# Patient Record
Sex: Female | Born: 1935 | Race: Black or African American | Hispanic: No | Smoking: Former smoker
Health system: Southern US, Community
[De-identification: ages and names within clinical notes are randomized; demographics above are authoritative.]

## PROBLEM LIST (undated history)

## (undated) DIAGNOSIS — I5032 Chronic diastolic (congestive) heart failure: Secondary | ICD-10-CM

## (undated) DIAGNOSIS — Z8719 Personal history of other diseases of the digestive system: Secondary | ICD-10-CM

## (undated) DIAGNOSIS — I739 Peripheral vascular disease, unspecified: Secondary | ICD-10-CM

## (undated) DIAGNOSIS — K5790 Diverticulosis of intestine, part unspecified, without perforation or abscess without bleeding: Secondary | ICD-10-CM

## (undated) DIAGNOSIS — M1990 Unspecified osteoarthritis, unspecified site: Secondary | ICD-10-CM

## (undated) DIAGNOSIS — I779 Disorder of arteries and arterioles, unspecified: Secondary | ICD-10-CM

## (undated) DIAGNOSIS — M545 Low back pain, unspecified: Secondary | ICD-10-CM

## (undated) DIAGNOSIS — M8580 Other specified disorders of bone density and structure, unspecified site: Secondary | ICD-10-CM

## (undated) DIAGNOSIS — Z862 Personal history of diseases of the blood and blood-forming organs and certain disorders involving the immune mechanism: Secondary | ICD-10-CM

## (undated) DIAGNOSIS — F329 Major depressive disorder, single episode, unspecified: Secondary | ICD-10-CM

## (undated) DIAGNOSIS — M5030 Other cervical disc degeneration, unspecified cervical region: Secondary | ICD-10-CM

## (undated) DIAGNOSIS — I219 Acute myocardial infarction, unspecified: Secondary | ICD-10-CM

## (undated) DIAGNOSIS — K2970 Gastritis, unspecified, without bleeding: Secondary | ICD-10-CM

## (undated) DIAGNOSIS — F32A Depression, unspecified: Secondary | ICD-10-CM

## (undated) DIAGNOSIS — I495 Sick sinus syndrome: Secondary | ICD-10-CM

## (undated) DIAGNOSIS — I2510 Atherosclerotic heart disease of native coronary artery without angina pectoris: Secondary | ICD-10-CM

## (undated) DIAGNOSIS — E785 Hyperlipidemia, unspecified: Secondary | ICD-10-CM

## (undated) DIAGNOSIS — H3530 Unspecified macular degeneration: Secondary | ICD-10-CM

## (undated) DIAGNOSIS — K3184 Gastroparesis: Secondary | ICD-10-CM

## (undated) DIAGNOSIS — E039 Hypothyroidism, unspecified: Secondary | ICD-10-CM

## (undated) DIAGNOSIS — N182 Chronic kidney disease, stage 2 (mild): Secondary | ICD-10-CM

## (undated) DIAGNOSIS — D649 Anemia, unspecified: Secondary | ICD-10-CM

## (undated) DIAGNOSIS — E119 Type 2 diabetes mellitus without complications: Secondary | ICD-10-CM

## (undated) DIAGNOSIS — K219 Gastro-esophageal reflux disease without esophagitis: Secondary | ICD-10-CM

## (undated) DIAGNOSIS — B9681 Helicobacter pylori [H. pylori] as the cause of diseases classified elsewhere: Secondary | ICD-10-CM

## (undated) DIAGNOSIS — K2270 Barrett's esophagus without dysplasia: Secondary | ICD-10-CM

## (undated) DIAGNOSIS — I10 Essential (primary) hypertension: Secondary | ICD-10-CM

## (undated) DIAGNOSIS — Z9289 Personal history of other medical treatment: Secondary | ICD-10-CM

## (undated) DIAGNOSIS — F419 Anxiety disorder, unspecified: Secondary | ICD-10-CM

## (undated) HISTORY — PX: EYE SURGERY: SHX253

## (undated) HISTORY — DX: Gastro-esophageal reflux disease without esophagitis: K21.9

## (undated) HISTORY — DX: Other specified disorders of bone density and structure, unspecified site: M85.80

## (undated) HISTORY — DX: Unspecified osteoarthritis, unspecified site: M19.90

## (undated) HISTORY — DX: Other cervical disc degeneration, unspecified cervical region: M50.30

## (undated) HISTORY — DX: Helicobacter pylori (H. pylori) as the cause of diseases classified elsewhere: K29.70

## (undated) HISTORY — DX: Barrett's esophagus without dysplasia: K22.70

## (undated) HISTORY — DX: Personal history of other medical treatment: Z92.89

## (undated) HISTORY — DX: Low back pain: M54.5

## (undated) HISTORY — DX: Depression, unspecified: F32.A

## (undated) HISTORY — DX: Anxiety disorder, unspecified: F41.9

## (undated) HISTORY — DX: Low back pain, unspecified: M54.50

## (undated) HISTORY — DX: Gastroparesis: K31.84

## (undated) HISTORY — DX: Personal history of diseases of the blood and blood-forming organs and certain disorders involving the immune mechanism: Z86.2

## (undated) HISTORY — DX: Hyperlipidemia, unspecified: E78.5

## (undated) HISTORY — DX: Sick sinus syndrome: I49.5

## (undated) HISTORY — DX: Essential (primary) hypertension: I10

## (undated) HISTORY — DX: Atherosclerotic heart disease of native coronary artery without angina pectoris: I25.10

## (undated) HISTORY — PX: ABDOMINAL HYSTERECTOMY: SHX81

## (undated) HISTORY — DX: Disorder of arteries and arterioles, unspecified: I77.9

## (undated) HISTORY — DX: Peripheral vascular disease, unspecified: I73.9

## (undated) HISTORY — PX: CARDIAC CATHETERIZATION: SHX172

## (undated) HISTORY — DX: Major depressive disorder, single episode, unspecified: F32.9

## (undated) HISTORY — PX: OVARIAN CYST REMOVAL: SHX89

## (undated) HISTORY — PX: CORONARY STENT PLACEMENT: SHX1402

## (undated) HISTORY — DX: Type 2 diabetes mellitus without complications: E11.9

## (undated) HISTORY — DX: Anemia, unspecified: D64.9

## (undated) HISTORY — PX: TUBAL LIGATION: SHX77

## (undated) HISTORY — DX: Helicobacter pylori (H. pylori) as the cause of diseases classified elsewhere: B96.81

## (undated) HISTORY — DX: Diverticulosis of intestine, part unspecified, without perforation or abscess without bleeding: K57.90

## (undated) HISTORY — PX: CHOLECYSTECTOMY: SHX55

## (undated) HISTORY — DX: Morbid (severe) obesity due to excess calories: E66.01

## (undated) SURGERY — ARTHROSCOPY, SHOULDER
Anesthesia: General | Laterality: Left

---

## 1997-11-04 ENCOUNTER — Other Ambulatory Visit: Admission: RE | Admit: 1997-11-04 | Discharge: 1997-11-04 | Payer: Self-pay | Admitting: Family Medicine

## 1998-11-04 ENCOUNTER — Encounter: Payer: Self-pay | Admitting: Emergency Medicine

## 1998-11-04 ENCOUNTER — Emergency Department (HOSPITAL_COMMUNITY): Admission: EM | Admit: 1998-11-04 | Discharge: 1998-11-04 | Payer: Self-pay | Admitting: Emergency Medicine

## 1998-11-05 ENCOUNTER — Inpatient Hospital Stay (HOSPITAL_COMMUNITY): Admission: EM | Admit: 1998-11-05 | Discharge: 1998-11-06 | Payer: Self-pay | Admitting: Emergency Medicine

## 1998-11-05 ENCOUNTER — Encounter: Payer: Self-pay | Admitting: *Deleted

## 1998-11-20 ENCOUNTER — Encounter: Admission: RE | Admit: 1998-11-20 | Discharge: 1998-11-20 | Payer: Self-pay | Admitting: Internal Medicine

## 2000-02-21 ENCOUNTER — Encounter: Payer: Self-pay | Admitting: Family Medicine

## 2000-02-21 ENCOUNTER — Ambulatory Visit (HOSPITAL_COMMUNITY): Admission: RE | Admit: 2000-02-21 | Discharge: 2000-02-21 | Payer: Self-pay | Admitting: Family Medicine

## 2000-07-25 ENCOUNTER — Other Ambulatory Visit: Admission: RE | Admit: 2000-07-25 | Discharge: 2000-07-25 | Payer: Self-pay | Admitting: Family Medicine

## 2000-10-10 ENCOUNTER — Encounter: Admission: RE | Admit: 2000-10-10 | Discharge: 2000-10-10 | Payer: Self-pay | Admitting: Obstetrics & Gynecology

## 2001-12-15 ENCOUNTER — Encounter: Payer: Self-pay | Admitting: Emergency Medicine

## 2001-12-15 ENCOUNTER — Emergency Department (HOSPITAL_COMMUNITY): Admission: EM | Admit: 2001-12-15 | Discharge: 2001-12-15 | Payer: Self-pay | Admitting: *Deleted

## 2002-01-17 ENCOUNTER — Encounter: Admission: RE | Admit: 2002-01-17 | Discharge: 2002-02-15 | Payer: Self-pay | Admitting: Orthopedic Surgery

## 2002-08-18 ENCOUNTER — Emergency Department (HOSPITAL_COMMUNITY): Admission: EM | Admit: 2002-08-18 | Discharge: 2002-08-18 | Payer: Self-pay | Admitting: Emergency Medicine

## 2002-08-21 ENCOUNTER — Encounter (HOSPITAL_BASED_OUTPATIENT_CLINIC_OR_DEPARTMENT_OTHER): Admission: RE | Admit: 2002-08-21 | Discharge: 2002-11-19 | Payer: Self-pay | Admitting: Internal Medicine

## 2002-09-10 ENCOUNTER — Encounter: Payer: Self-pay | Admitting: Orthopedic Surgery

## 2002-09-10 ENCOUNTER — Encounter: Admission: RE | Admit: 2002-09-10 | Discharge: 2002-09-10 | Payer: Self-pay | Admitting: Orthopedic Surgery

## 2002-12-13 ENCOUNTER — Emergency Department (HOSPITAL_COMMUNITY): Admission: EM | Admit: 2002-12-13 | Discharge: 2002-12-14 | Payer: Self-pay | Admitting: Emergency Medicine

## 2002-12-14 ENCOUNTER — Encounter: Payer: Self-pay | Admitting: Emergency Medicine

## 2002-12-24 ENCOUNTER — Encounter: Admission: RE | Admit: 2002-12-24 | Discharge: 2003-01-01 | Payer: Self-pay | Admitting: Internal Medicine

## 2003-04-10 ENCOUNTER — Encounter (HOSPITAL_BASED_OUTPATIENT_CLINIC_OR_DEPARTMENT_OTHER): Admission: RE | Admit: 2003-04-10 | Discharge: 2003-04-18 | Payer: Self-pay | Admitting: Internal Medicine

## 2003-05-30 ENCOUNTER — Inpatient Hospital Stay (HOSPITAL_COMMUNITY): Admission: EM | Admit: 2003-05-30 | Discharge: 2003-06-03 | Payer: Self-pay | Admitting: Endocrinology

## 2003-05-30 ENCOUNTER — Emergency Department (HOSPITAL_COMMUNITY): Admission: EM | Admit: 2003-05-30 | Discharge: 2003-05-30 | Payer: Self-pay | Admitting: Emergency Medicine

## 2003-08-10 ENCOUNTER — Emergency Department (HOSPITAL_COMMUNITY): Admission: EM | Admit: 2003-08-10 | Discharge: 2003-08-10 | Payer: Self-pay | Admitting: Emergency Medicine

## 2003-08-13 ENCOUNTER — Encounter (HOSPITAL_BASED_OUTPATIENT_CLINIC_OR_DEPARTMENT_OTHER): Admission: RE | Admit: 2003-08-13 | Discharge: 2003-08-29 | Payer: Self-pay | Admitting: Internal Medicine

## 2003-11-26 ENCOUNTER — Encounter (HOSPITAL_BASED_OUTPATIENT_CLINIC_OR_DEPARTMENT_OTHER): Admission: RE | Admit: 2003-11-26 | Discharge: 2003-12-04 | Payer: Self-pay | Admitting: Internal Medicine

## 2004-02-12 ENCOUNTER — Ambulatory Visit: Payer: Self-pay | Admitting: Internal Medicine

## 2004-02-24 ENCOUNTER — Encounter (HOSPITAL_BASED_OUTPATIENT_CLINIC_OR_DEPARTMENT_OTHER): Admission: RE | Admit: 2004-02-24 | Discharge: 2004-03-24 | Payer: Self-pay | Admitting: Internal Medicine

## 2004-03-08 ENCOUNTER — Ambulatory Visit: Payer: Self-pay | Admitting: Internal Medicine

## 2004-03-11 ENCOUNTER — Ambulatory Visit: Payer: Self-pay | Admitting: Internal Medicine

## 2004-06-01 ENCOUNTER — Ambulatory Visit: Payer: Self-pay | Admitting: Internal Medicine

## 2004-06-02 ENCOUNTER — Ambulatory Visit (HOSPITAL_COMMUNITY): Admission: RE | Admit: 2004-06-02 | Discharge: 2004-06-02 | Payer: Self-pay | Admitting: Internal Medicine

## 2004-06-07 ENCOUNTER — Encounter: Admission: RE | Admit: 2004-06-07 | Discharge: 2004-06-07 | Payer: Self-pay | Admitting: Internal Medicine

## 2004-06-07 ENCOUNTER — Encounter (HOSPITAL_BASED_OUTPATIENT_CLINIC_OR_DEPARTMENT_OTHER): Admission: RE | Admit: 2004-06-07 | Discharge: 2004-06-18 | Payer: Self-pay | Admitting: Internal Medicine

## 2004-06-09 ENCOUNTER — Ambulatory Visit: Payer: Self-pay | Admitting: Internal Medicine

## 2004-07-15 ENCOUNTER — Encounter: Admission: RE | Admit: 2004-07-15 | Discharge: 2004-07-30 | Payer: Self-pay | Admitting: Neurosurgery

## 2004-07-19 ENCOUNTER — Ambulatory Visit: Payer: Self-pay | Admitting: Endocrinology

## 2004-07-20 ENCOUNTER — Ambulatory Visit: Payer: Self-pay | Admitting: Cardiology

## 2004-07-27 ENCOUNTER — Ambulatory Visit: Payer: Self-pay | Admitting: Internal Medicine

## 2004-08-12 ENCOUNTER — Ambulatory Visit: Payer: Self-pay | Admitting: Gastroenterology

## 2004-08-26 ENCOUNTER — Encounter (INDEPENDENT_AMBULATORY_CARE_PROVIDER_SITE_OTHER): Payer: Self-pay | Admitting: *Deleted

## 2004-08-26 ENCOUNTER — Ambulatory Visit: Payer: Self-pay | Admitting: Gastroenterology

## 2004-08-30 ENCOUNTER — Ambulatory Visit (HOSPITAL_COMMUNITY): Admission: RE | Admit: 2004-08-30 | Discharge: 2004-08-30 | Payer: Self-pay | Admitting: Internal Medicine

## 2004-09-07 ENCOUNTER — Ambulatory Visit: Payer: Self-pay | Admitting: Gastroenterology

## 2004-09-14 ENCOUNTER — Ambulatory Visit: Payer: Self-pay | Admitting: Internal Medicine

## 2004-11-01 ENCOUNTER — Ambulatory Visit: Payer: Self-pay | Admitting: Internal Medicine

## 2004-12-14 ENCOUNTER — Ambulatory Visit: Payer: Self-pay | Admitting: Internal Medicine

## 2005-01-25 ENCOUNTER — Ambulatory Visit: Payer: Self-pay | Admitting: Internal Medicine

## 2005-04-19 ENCOUNTER — Ambulatory Visit: Payer: Self-pay | Admitting: Internal Medicine

## 2005-04-26 ENCOUNTER — Ambulatory Visit: Payer: Self-pay | Admitting: Internal Medicine

## 2005-05-17 ENCOUNTER — Observation Stay (HOSPITAL_COMMUNITY): Admission: EM | Admit: 2005-05-17 | Discharge: 2005-05-18 | Payer: Self-pay | Admitting: Emergency Medicine

## 2005-05-17 ENCOUNTER — Ambulatory Visit: Payer: Self-pay | Admitting: Endocrinology

## 2005-06-07 ENCOUNTER — Ambulatory Visit: Payer: Self-pay | Admitting: Internal Medicine

## 2005-07-25 ENCOUNTER — Ambulatory Visit: Payer: Self-pay | Admitting: Internal Medicine

## 2005-09-12 ENCOUNTER — Ambulatory Visit: Payer: Self-pay | Admitting: Internal Medicine

## 2005-09-20 ENCOUNTER — Encounter: Admission: RE | Admit: 2005-09-20 | Discharge: 2005-09-20 | Payer: Self-pay | Admitting: Internal Medicine

## 2005-10-27 ENCOUNTER — Encounter: Admission: RE | Admit: 2005-10-27 | Discharge: 2005-10-27 | Payer: Self-pay | Admitting: Orthopedic Surgery

## 2005-12-08 ENCOUNTER — Inpatient Hospital Stay (HOSPITAL_COMMUNITY): Admission: RE | Admit: 2005-12-08 | Discharge: 2005-12-11 | Payer: Self-pay | Admitting: Orthopedic Surgery

## 2005-12-09 ENCOUNTER — Ambulatory Visit: Payer: Self-pay | Admitting: Internal Medicine

## 2005-12-13 ENCOUNTER — Inpatient Hospital Stay (HOSPITAL_COMMUNITY): Admission: EM | Admit: 2005-12-13 | Discharge: 2005-12-19 | Payer: Self-pay | Admitting: Orthopedic Surgery

## 2006-01-24 ENCOUNTER — Ambulatory Visit: Payer: Self-pay | Admitting: Internal Medicine

## 2006-04-20 ENCOUNTER — Ambulatory Visit: Payer: Self-pay | Admitting: Internal Medicine

## 2006-04-20 LAB — CONVERTED CEMR LAB
AST: 54 units/L — ABNORMAL HIGH (ref 0–37)
Albumin: 3.7 g/dL (ref 3.5–5.2)
Alkaline Phosphatase: 106 units/L (ref 39–117)
BUN: 23 mg/dL (ref 6–23)
Bilirubin Urine: NEGATIVE
CO2: 33 meq/L — ABNORMAL HIGH (ref 19–32)
Calcium: 9.7 mg/dL (ref 8.4–10.5)
Chol/HDL Ratio, serum: 4.1
Creatinine, Ser: 1 mg/dL (ref 0.4–1.2)
Creatinine,U: 81.8 mg/dL
Eosinophil percent: 3 % (ref 0.0–5.0)
HCT: 41.5 % (ref 36.0–46.0)
HDL: 49.7 mg/dL (ref >39.0)
Hemoglobin: 14.2 g/dL (ref 12.0–15.0)
Hgb A1c MFr Bld: 6.9 % — ABNORMAL HIGH (ref 4.6–6.0)
Ketones, ur: NEGATIVE mg/dL
LDL DIRECT: 125.8 mg/dL
Lymphocytes Relative: 41.1 % (ref 12.0–46.0)
Neutrophils Relative %: 43.3 % (ref 43.0–77.0)
Nitrite: NEGATIVE
Potassium: 3.4 meq/L — ABNORMAL LOW (ref 3.5–5.1)
RDW: 13.6 % (ref 11.5–14.6)
Specific Gravity, Urine: 1.01 (ref 1.000–1.03)
TSH: 2.88 microintl units/mL (ref 0.35–5.50)
Total Bilirubin: 0.7 mg/dL (ref 0.3–1.2)
Total Protein, Urine: NEGATIVE mg/dL
Total Protein: 7.4 g/dL (ref 6.0–8.3)
Triglyceride fasting, serum: 192 mg/dL — ABNORMAL HIGH (ref 0–149)
Urine Glucose: NEGATIVE mg/dL

## 2006-05-09 ENCOUNTER — Ambulatory Visit: Payer: Self-pay | Admitting: Gastroenterology

## 2006-05-23 ENCOUNTER — Ambulatory Visit: Payer: Self-pay | Admitting: Gastroenterology

## 2006-06-08 ENCOUNTER — Ambulatory Visit: Payer: Self-pay | Admitting: Internal Medicine

## 2006-06-22 ENCOUNTER — Ambulatory Visit: Payer: Self-pay | Admitting: Internal Medicine

## 2006-08-07 ENCOUNTER — Ambulatory Visit: Payer: Self-pay | Admitting: Internal Medicine

## 2006-08-23 ENCOUNTER — Ambulatory Visit: Payer: Self-pay | Admitting: Internal Medicine

## 2006-08-23 LAB — CONVERTED CEMR LAB
Albumin: 3.4 g/dL — ABNORMAL LOW (ref 3.5–5.2)
CO2: 34 meq/L — ABNORMAL HIGH (ref 19–32)
Chloride: 106 meq/L (ref 96–112)
Cholesterol: 151 mg/dL (ref 0–200)
Creatinine, Ser: 0.9 mg/dL (ref 0.4–1.2)
GFR calc non Af Amer: 66 mL/min
Glucose, Bld: 172 mg/dL — ABNORMAL HIGH (ref 70–99)
HDL: 44.5 mg/dL (ref >39.0)
Sodium: 144 meq/L (ref 135–145)
Total CHOL/HDL Ratio: 3.4
Triglycerides: 166 mg/dL — ABNORMAL HIGH (ref 0–149)

## 2006-09-18 ENCOUNTER — Inpatient Hospital Stay (HOSPITAL_COMMUNITY): Admission: EM | Admit: 2006-09-18 | Discharge: 2006-09-20 | Payer: Self-pay | Admitting: Emergency Medicine

## 2006-09-18 ENCOUNTER — Ambulatory Visit: Payer: Self-pay | Admitting: Internal Medicine

## 2006-09-18 ENCOUNTER — Ambulatory Visit: Payer: Self-pay | Admitting: Cardiology

## 2006-10-26 DIAGNOSIS — I10 Essential (primary) hypertension: Secondary | ICD-10-CM

## 2006-12-19 ENCOUNTER — Ambulatory Visit: Payer: Self-pay | Admitting: Internal Medicine

## 2006-12-19 LAB — CONVERTED CEMR LAB
Albumin: 3.4 g/dL — ABNORMAL LOW (ref 3.5–5.2)
Basophils Absolute: 0.1 10*3/uL (ref 0.0–0.1)
CO2: 30 meq/L (ref 19–32)
Creatinine, Ser: 0.8 mg/dL (ref 0.4–1.2)
HCT: 38.4 % (ref 36.0–46.0)
Hemoglobin: 13.1 g/dL (ref 12.0–15.0)
Hgb A1c MFr Bld: 10.6 % — ABNORMAL HIGH (ref 4.6–6.0)
Lymphocytes Relative: 25.2 % (ref 12.0–46.0)
MCHC: 34 g/dL (ref 30.0–36.0)
Monocytes Absolute: 0.9 10*3/uL — ABNORMAL HIGH (ref 0.2–0.7)
Neutro Abs: 6 10*3/uL (ref 1.4–7.7)
Neutrophils Relative %: 62.1 % (ref 43.0–77.0)
Potassium: 3.5 meq/L (ref 3.5–5.1)
RDW: 12.5 % (ref 11.5–14.6)
Sodium: 139 meq/L (ref 135–145)
Total Bilirubin: 0.6 mg/dL (ref 0.3–1.2)
Total CK: 187 units/L — ABNORMAL HIGH (ref 7–177)
Total Protein: 6.7 g/dL (ref 6.0–8.3)

## 2006-12-25 ENCOUNTER — Ambulatory Visit: Payer: Self-pay | Admitting: Internal Medicine

## 2006-12-25 LAB — CONVERTED CEMR LAB
Bilirubin Urine: NEGATIVE
Hemoglobin, Urine: NEGATIVE
Leukocytes, UA: NEGATIVE
Mucus, UA: NEGATIVE
Total Protein, Urine: NEGATIVE mg/dL
Urine Glucose: >=1000 mg/dL — CR
Urobilinogen, UA: 1 (ref 0.0–1.0)

## 2006-12-29 ENCOUNTER — Emergency Department (HOSPITAL_COMMUNITY): Admission: EM | Admit: 2006-12-29 | Discharge: 2006-12-29 | Payer: Self-pay | Admitting: Emergency Medicine

## 2007-01-01 ENCOUNTER — Encounter: Payer: Self-pay | Admitting: Internal Medicine

## 2007-01-01 DIAGNOSIS — M109 Gout, unspecified: Secondary | ICD-10-CM

## 2007-01-01 DIAGNOSIS — M949 Disorder of cartilage, unspecified: Secondary | ICD-10-CM

## 2007-01-01 DIAGNOSIS — E785 Hyperlipidemia, unspecified: Secondary | ICD-10-CM | POA: Insufficient documentation

## 2007-01-01 DIAGNOSIS — F329 Major depressive disorder, single episode, unspecified: Secondary | ICD-10-CM

## 2007-01-01 DIAGNOSIS — M899 Disorder of bone, unspecified: Secondary | ICD-10-CM | POA: Insufficient documentation

## 2007-01-01 DIAGNOSIS — M1990 Unspecified osteoarthritis, unspecified site: Secondary | ICD-10-CM | POA: Insufficient documentation

## 2007-01-01 DIAGNOSIS — M545 Low back pain, unspecified: Secondary | ICD-10-CM | POA: Insufficient documentation

## 2007-01-01 DIAGNOSIS — I739 Peripheral vascular disease, unspecified: Secondary | ICD-10-CM

## 2007-01-01 DIAGNOSIS — K5730 Diverticulosis of large intestine without perforation or abscess without bleeding: Secondary | ICD-10-CM | POA: Insufficient documentation

## 2007-01-16 ENCOUNTER — Ambulatory Visit: Payer: Self-pay

## 2007-01-17 ENCOUNTER — Ambulatory Visit: Payer: Self-pay

## 2007-01-29 ENCOUNTER — Telehealth (INDEPENDENT_AMBULATORY_CARE_PROVIDER_SITE_OTHER): Payer: Self-pay | Admitting: *Deleted

## 2007-02-15 ENCOUNTER — Ambulatory Visit: Payer: Self-pay | Admitting: Internal Medicine

## 2007-02-15 DIAGNOSIS — K219 Gastro-esophageal reflux disease without esophagitis: Secondary | ICD-10-CM | POA: Insufficient documentation

## 2007-02-15 DIAGNOSIS — R609 Edema, unspecified: Secondary | ICD-10-CM | POA: Insufficient documentation

## 2007-02-15 DIAGNOSIS — F411 Generalized anxiety disorder: Secondary | ICD-10-CM | POA: Insufficient documentation

## 2007-02-15 DIAGNOSIS — M48061 Spinal stenosis, lumbar region without neurogenic claudication: Secondary | ICD-10-CM | POA: Insufficient documentation

## 2007-02-15 DIAGNOSIS — M5137 Other intervertebral disc degeneration, lumbosacral region: Secondary | ICD-10-CM

## 2007-02-15 DIAGNOSIS — K3184 Gastroparesis: Secondary | ICD-10-CM

## 2007-02-15 DIAGNOSIS — M5030 Other cervical disc degeneration, unspecified cervical region: Secondary | ICD-10-CM

## 2007-02-15 DIAGNOSIS — Z8719 Personal history of other diseases of the digestive system: Secondary | ICD-10-CM | POA: Insufficient documentation

## 2007-02-21 ENCOUNTER — Telehealth (INDEPENDENT_AMBULATORY_CARE_PROVIDER_SITE_OTHER): Payer: Self-pay | Admitting: *Deleted

## 2007-02-28 ENCOUNTER — Ambulatory Visit: Payer: Self-pay | Admitting: Internal Medicine

## 2007-02-28 DIAGNOSIS — R42 Dizziness and giddiness: Secondary | ICD-10-CM | POA: Insufficient documentation

## 2007-04-12 DIAGNOSIS — I2510 Atherosclerotic heart disease of native coronary artery without angina pectoris: Secondary | ICD-10-CM

## 2007-04-12 HISTORY — DX: Atherosclerotic heart disease of native coronary artery without angina pectoris: I25.10

## 2007-04-18 ENCOUNTER — Telehealth (INDEPENDENT_AMBULATORY_CARE_PROVIDER_SITE_OTHER): Payer: Self-pay | Admitting: *Deleted

## 2007-04-20 ENCOUNTER — Ambulatory Visit: Payer: Self-pay | Admitting: Internal Medicine

## 2007-04-20 DIAGNOSIS — R079 Chest pain, unspecified: Secondary | ICD-10-CM

## 2007-04-23 ENCOUNTER — Telehealth (INDEPENDENT_AMBULATORY_CARE_PROVIDER_SITE_OTHER): Payer: Self-pay | Admitting: *Deleted

## 2007-04-23 ENCOUNTER — Ambulatory Visit: Payer: Self-pay | Admitting: Cardiology

## 2007-04-23 ENCOUNTER — Ambulatory Visit: Payer: Self-pay | Admitting: Internal Medicine

## 2007-04-23 ENCOUNTER — Inpatient Hospital Stay (HOSPITAL_COMMUNITY): Admission: EM | Admit: 2007-04-23 | Discharge: 2007-04-28 | Payer: Self-pay | Admitting: Emergency Medicine

## 2007-05-03 ENCOUNTER — Ambulatory Visit: Payer: Self-pay | Admitting: Internal Medicine

## 2007-05-03 DIAGNOSIS — Z951 Presence of aortocoronary bypass graft: Secondary | ICD-10-CM

## 2007-05-18 ENCOUNTER — Ambulatory Visit: Payer: Self-pay | Admitting: Internal Medicine

## 2007-05-18 ENCOUNTER — Ambulatory Visit: Payer: Self-pay | Admitting: Cardiovascular Disease

## 2007-05-18 LAB — CONVERTED CEMR LAB
Calcium: 9.3 mg/dL (ref 8.4–10.5)
Chloride: 104 meq/L (ref 96–112)
GFR calc non Af Amer: 65 mL/min
Hgb A1c MFr Bld: 7.5 % — ABNORMAL HIGH (ref 4.6–6.0)
LDL Cholesterol: 57 mg/dL (ref 0–99)
Sodium: 141 meq/L (ref 135–145)
VLDL: 24 mg/dL (ref 0–40)

## 2007-06-04 ENCOUNTER — Encounter (HOSPITAL_COMMUNITY): Admission: RE | Admit: 2007-06-04 | Discharge: 2007-06-04 | Payer: Self-pay | Admitting: Cardiovascular Disease

## 2007-06-06 ENCOUNTER — Ambulatory Visit: Payer: Self-pay | Admitting: Cardiology

## 2007-06-06 ENCOUNTER — Telehealth: Payer: Self-pay | Admitting: Internal Medicine

## 2007-06-06 ENCOUNTER — Observation Stay (HOSPITAL_COMMUNITY): Admission: EM | Admit: 2007-06-06 | Discharge: 2007-06-08 | Payer: Self-pay | Admitting: Emergency Medicine

## 2007-06-06 ENCOUNTER — Encounter: Payer: Self-pay | Admitting: Internal Medicine

## 2007-06-12 ENCOUNTER — Encounter: Payer: Self-pay | Admitting: Internal Medicine

## 2007-08-21 ENCOUNTER — Ambulatory Visit: Payer: Self-pay | Admitting: Cardiovascular Disease

## 2007-09-12 ENCOUNTER — Ambulatory Visit: Payer: Self-pay | Admitting: Internal Medicine

## 2007-09-12 DIAGNOSIS — M79609 Pain in unspecified limb: Secondary | ICD-10-CM

## 2007-09-19 ENCOUNTER — Telehealth: Payer: Self-pay | Admitting: Internal Medicine

## 2007-09-24 ENCOUNTER — Ambulatory Visit: Payer: Self-pay | Admitting: Endocrinology

## 2007-09-24 DIAGNOSIS — R109 Unspecified abdominal pain: Secondary | ICD-10-CM | POA: Insufficient documentation

## 2007-09-24 LAB — CONVERTED CEMR LAB
CO2: 30 meq/L (ref 19–32)
Calcium: 8.7 mg/dL (ref 8.4–10.5)
Chloride: 103 meq/L (ref 96–112)
Glucose, Bld: 234 mg/dL — ABNORMAL HIGH (ref 70–99)
Hemoglobin: 12.7 g/dL (ref 12.0–15.0)
Lymphocytes Relative: 19.6 % (ref 12.0–46.0)
Monocytes Relative: 10.7 % (ref 3.0–12.0)
Neutro Abs: 4.3 10*3/uL (ref 1.4–7.7)
Potassium: 3.6 meq/L (ref 3.5–5.1)
RDW: 14.1 % (ref 11.5–14.6)
Sodium: 140 meq/L (ref 135–145)

## 2007-09-26 ENCOUNTER — Telehealth: Payer: Self-pay | Admitting: Internal Medicine

## 2007-09-26 ENCOUNTER — Encounter: Payer: Self-pay | Admitting: Endocrinology

## 2007-09-27 ENCOUNTER — Encounter: Payer: Self-pay | Admitting: Endocrinology

## 2007-11-08 ENCOUNTER — Telehealth (INDEPENDENT_AMBULATORY_CARE_PROVIDER_SITE_OTHER): Payer: Self-pay | Admitting: *Deleted

## 2007-11-26 ENCOUNTER — Ambulatory Visit: Payer: Self-pay | Admitting: Oncology

## 2007-11-27 ENCOUNTER — Ambulatory Visit: Payer: Self-pay | Admitting: Cardiovascular Disease

## 2007-11-27 ENCOUNTER — Inpatient Hospital Stay (HOSPITAL_COMMUNITY): Admission: EM | Admit: 2007-11-27 | Discharge: 2007-11-29 | Payer: Self-pay | Admitting: Emergency Medicine

## 2007-12-12 ENCOUNTER — Ambulatory Visit: Payer: Self-pay | Admitting: Internal Medicine

## 2007-12-13 ENCOUNTER — Telehealth (INDEPENDENT_AMBULATORY_CARE_PROVIDER_SITE_OTHER): Payer: Self-pay | Admitting: *Deleted

## 2007-12-14 LAB — CONVERTED CEMR LAB
ALT: 42 units/L — ABNORMAL HIGH (ref 0–35)
Basophils Absolute: 0 10*3/uL (ref 0.0–0.1)
Bilirubin Urine: NEGATIVE
Bilirubin, Direct: 0.2 mg/dL (ref 0.0–0.3)
CO2: 30 meq/L (ref 19–32)
Calcium: 8.8 mg/dL (ref 8.4–10.5)
Chloride: 102 meq/L (ref 96–112)
Creatinine, Ser: 1 mg/dL (ref 0.4–1.2)
Creatinine,U: 70.4 mg/dL
Eosinophils Absolute: 0.2 10*3/uL (ref 0.0–0.7)
GFR calc non Af Amer: 58 mL/min
HDL: 32.1 mg/dL — ABNORMAL LOW (ref >39.0)
Hgb A1c MFr Bld: 11.2 % — ABNORMAL HIGH (ref 4.6–6.0)
Ketones, ur: NEGATIVE mg/dL
LDL Cholesterol: 100 mg/dL — ABNORMAL HIGH (ref 0–99)
Leukocytes, UA: NEGATIVE
Lymphocytes Relative: 18 % (ref 12.0–46.0)
MCHC: 34.9 g/dL (ref 30.0–36.0)
MCV: 88.5 fL (ref 78.0–100.0)
Microalb Creat Ratio: 2.8 mg/g (ref 0.0–30.0)
Microalb, Ur: 0.2 mg/dL (ref 0.0–1.9)
Mucus, UA: NEGATIVE
Neutrophils Relative %: 71.8 % (ref 43.0–77.0)
RDW: 13.8 % (ref 11.5–14.6)
Sodium: 136 meq/L (ref 135–145)
Squamous Epithelial / LPF: NEGATIVE /lpf
TSH: 2.12 microintl units/mL (ref 0.35–5.50)
Total Bilirubin: 0.7 mg/dL (ref 0.3–1.2)
Total CHOL/HDL Ratio: 5.3
Triglycerides: 186 mg/dL — ABNORMAL HIGH (ref 0–149)
Urobilinogen, UA: 1 (ref 0.0–1.0)
VLDL: 37 mg/dL (ref 0–40)

## 2007-12-19 ENCOUNTER — Telehealth (INDEPENDENT_AMBULATORY_CARE_PROVIDER_SITE_OTHER): Payer: Self-pay | Admitting: *Deleted

## 2007-12-28 ENCOUNTER — Ambulatory Visit: Payer: Self-pay | Admitting: Endocrinology

## 2007-12-31 ENCOUNTER — Encounter: Payer: Self-pay | Admitting: Internal Medicine

## 2008-01-14 ENCOUNTER — Ambulatory Visit: Payer: Self-pay | Admitting: Endocrinology

## 2008-01-21 ENCOUNTER — Telehealth (INDEPENDENT_AMBULATORY_CARE_PROVIDER_SITE_OTHER): Payer: Self-pay | Admitting: *Deleted

## 2008-01-29 ENCOUNTER — Telehealth (INDEPENDENT_AMBULATORY_CARE_PROVIDER_SITE_OTHER): Payer: Self-pay | Admitting: *Deleted

## 2008-02-01 ENCOUNTER — Ambulatory Visit: Payer: Self-pay | Admitting: Endocrinology

## 2008-03-22 ENCOUNTER — Ambulatory Visit: Payer: Self-pay | Admitting: Cardiovascular Disease

## 2008-03-22 ENCOUNTER — Inpatient Hospital Stay (HOSPITAL_COMMUNITY): Admission: EM | Admit: 2008-03-22 | Discharge: 2008-03-25 | Payer: Self-pay | Admitting: Emergency Medicine

## 2008-05-08 ENCOUNTER — Encounter: Payer: Self-pay | Admitting: Internal Medicine

## 2008-07-30 ENCOUNTER — Telehealth: Payer: Self-pay | Admitting: Internal Medicine

## 2008-08-22 ENCOUNTER — Ambulatory Visit: Payer: Self-pay | Admitting: Endocrinology

## 2008-08-22 LAB — CONVERTED CEMR LAB
CO2: 30 meq/L (ref 19–32)
Calcium: 8.8 mg/dL (ref 8.4–10.5)
Chloride: 107 meq/L (ref 96–112)
Glucose, Bld: 240 mg/dL — ABNORMAL HIGH (ref 70–99)
HDL: 36.7 mg/dL — ABNORMAL LOW (ref >39.00)
Hgb A1c MFr Bld: 11.4 % — ABNORMAL HIGH (ref 4.6–6.5)
Potassium: 3.8 meq/L (ref 3.5–5.1)
Sodium: 140 meq/L (ref 135–145)
Total CHOL/HDL Ratio: 5

## 2008-09-02 ENCOUNTER — Ambulatory Visit: Payer: Self-pay | Admitting: Internal Medicine

## 2008-09-02 LAB — CONVERTED CEMR LAB
ALT: 23 units/L (ref 0–35)
Alkaline Phosphatase: 118 units/L — ABNORMAL HIGH (ref 39–117)
BUN: 16 mg/dL (ref 6–23)
Bilirubin, Direct: 0.2 mg/dL (ref 0.0–0.3)
Calcium: 9.3 mg/dL (ref 8.4–10.5)
Chloride: 109 meq/L (ref 96–112)
Cholesterol: 139 mg/dL (ref 0–200)
Creatinine, Ser: 0.6 mg/dL (ref 0.4–1.2)
Eosinophils Absolute: 0.2 10*3/uL (ref 0.0–0.7)
Eosinophils Relative: 2.6 % (ref 0.0–5.0)
HDL: 43.9 mg/dL (ref >39.00)
Hgb A1c MFr Bld: 11.3 % — ABNORMAL HIGH (ref 4.6–6.5)
LDL Cholesterol: 78 mg/dL (ref 0–99)
Lymphocytes Relative: 22.8 % (ref 12.0–46.0)
MCV: 86.5 fL (ref 78.0–100.0)
Monocytes Absolute: 0.5 10*3/uL (ref 0.1–1.0)
Neutrophils Relative %: 65.5 % (ref 43.0–77.0)
Nitrite: NEGATIVE
Platelets: 95 10*3/uL — ABNORMAL LOW (ref 150.0–400.0)
RBC: 4.18 M/uL (ref 3.87–5.11)
Total Bilirubin: 0.6 mg/dL (ref 0.3–1.2)
Total CHOL/HDL Ratio: 3
Total Protein, Urine: 100 mg/dL
Triglycerides: 86 mg/dL (ref 0.0–149.0)
Urine Glucose: 500 mg/dL
VLDL: 17.2 mg/dL (ref 0.0–40.0)
WBC: 6.5 10*3/uL (ref 4.5–10.5)
pH: 6 (ref 5.0–8.0)

## 2008-09-09 ENCOUNTER — Ambulatory Visit: Payer: Self-pay | Admitting: Internal Medicine

## 2008-09-11 ENCOUNTER — Telehealth (INDEPENDENT_AMBULATORY_CARE_PROVIDER_SITE_OTHER): Payer: Self-pay | Admitting: *Deleted

## 2008-09-25 ENCOUNTER — Ambulatory Visit: Payer: Self-pay | Admitting: Internal Medicine

## 2008-11-24 ENCOUNTER — Telehealth: Payer: Self-pay | Admitting: Endocrinology

## 2008-11-26 ENCOUNTER — Telehealth: Payer: Self-pay | Admitting: Internal Medicine

## 2008-12-10 ENCOUNTER — Encounter: Payer: Self-pay | Admitting: Endocrinology

## 2008-12-12 ENCOUNTER — Ambulatory Visit: Payer: Self-pay | Admitting: Endocrinology

## 2008-12-12 DIAGNOSIS — R809 Proteinuria, unspecified: Secondary | ICD-10-CM | POA: Insufficient documentation

## 2008-12-28 ENCOUNTER — Emergency Department (HOSPITAL_COMMUNITY): Admission: EM | Admit: 2008-12-28 | Discharge: 2008-12-28 | Payer: Self-pay | Admitting: Emergency Medicine

## 2008-12-29 ENCOUNTER — Ambulatory Visit: Payer: Self-pay | Admitting: Internal Medicine

## 2008-12-29 DIAGNOSIS — M25519 Pain in unspecified shoulder: Secondary | ICD-10-CM

## 2009-01-13 ENCOUNTER — Ambulatory Visit: Payer: Self-pay | Admitting: Endocrinology

## 2009-02-09 ENCOUNTER — Encounter: Admission: RE | Admit: 2009-02-09 | Discharge: 2009-02-09 | Payer: Self-pay | Admitting: Orthopedic Surgery

## 2009-03-13 ENCOUNTER — Ambulatory Visit: Payer: Self-pay | Admitting: Endocrinology

## 2009-03-13 LAB — CONVERTED CEMR LAB: Hgb A1c MFr Bld: 9.9 % — ABNORMAL HIGH (ref 4.6–6.5)

## 2009-03-20 ENCOUNTER — Ambulatory Visit: Payer: Self-pay | Admitting: Endocrinology

## 2009-03-20 LAB — CONVERTED CEMR LAB
CO2: 30 meq/L (ref 19–32)
Calcium: 8.9 mg/dL (ref 8.4–10.5)
Chloride: 105 meq/L (ref 96–112)
Potassium: 4.4 meq/L (ref 3.5–5.1)
Sodium: 140 meq/L (ref 135–145)

## 2009-04-11 HISTORY — PX: CORONARY ARTERY BYPASS GRAFT: SHX141

## 2009-05-11 ENCOUNTER — Ambulatory Visit: Payer: Self-pay | Admitting: Internal Medicine

## 2009-05-11 DIAGNOSIS — B029 Zoster without complications: Secondary | ICD-10-CM | POA: Insufficient documentation

## 2009-06-19 ENCOUNTER — Ambulatory Visit: Payer: Self-pay | Admitting: Endocrinology

## 2009-08-06 ENCOUNTER — Telehealth: Payer: Self-pay | Admitting: Internal Medicine

## 2009-09-11 ENCOUNTER — Ambulatory Visit: Payer: Self-pay | Admitting: Internal Medicine

## 2009-09-11 DIAGNOSIS — IMO0002 Reserved for concepts with insufficient information to code with codable children: Secondary | ICD-10-CM

## 2009-09-11 DIAGNOSIS — M549 Dorsalgia, unspecified: Secondary | ICD-10-CM | POA: Insufficient documentation

## 2009-09-11 DIAGNOSIS — J309 Allergic rhinitis, unspecified: Secondary | ICD-10-CM | POA: Insufficient documentation

## 2009-09-25 ENCOUNTER — Encounter: Payer: Self-pay | Admitting: Internal Medicine

## 2009-10-30 ENCOUNTER — Ambulatory Visit: Payer: Self-pay | Admitting: Internal Medicine

## 2009-10-30 ENCOUNTER — Ambulatory Visit: Payer: Self-pay | Admitting: Endocrinology

## 2009-10-30 DIAGNOSIS — I495 Sick sinus syndrome: Secondary | ICD-10-CM | POA: Insufficient documentation

## 2009-11-02 ENCOUNTER — Ambulatory Visit: Payer: Self-pay | Admitting: Internal Medicine

## 2009-11-04 ENCOUNTER — Telehealth: Payer: Self-pay | Admitting: Internal Medicine

## 2009-11-16 ENCOUNTER — Ambulatory Visit (HOSPITAL_COMMUNITY): Admission: RE | Admit: 2009-11-16 | Discharge: 2009-11-16 | Payer: Self-pay | Admitting: Internal Medicine

## 2009-11-16 ENCOUNTER — Encounter: Payer: Self-pay | Admitting: Internal Medicine

## 2009-11-16 ENCOUNTER — Ambulatory Visit: Payer: Self-pay | Admitting: Internal Medicine

## 2009-11-16 ENCOUNTER — Ambulatory Visit: Payer: Self-pay

## 2009-11-26 ENCOUNTER — Ambulatory Visit: Payer: Self-pay | Admitting: Cardiovascular Disease

## 2009-11-26 DIAGNOSIS — I5033 Acute on chronic diastolic (congestive) heart failure: Secondary | ICD-10-CM | POA: Insufficient documentation

## 2009-12-10 LAB — CONVERTED CEMR LAB
BUN: 26 mg/dL — ABNORMAL HIGH (ref 6–23)
Creatinine, Ser: 1.1 mg/dL (ref 0.4–1.2)
GFR calc non Af Amer: 63.59 mL/min (ref >60)
Pro B Natriuretic peptide (BNP): 81.6 pg/mL (ref 0.0–100.0)

## 2009-12-11 ENCOUNTER — Ambulatory Visit: Payer: Self-pay | Admitting: Endocrinology

## 2010-01-21 ENCOUNTER — Ambulatory Visit: Payer: Self-pay | Admitting: Endocrinology

## 2010-02-11 ENCOUNTER — Ambulatory Visit: Payer: Self-pay | Admitting: Cardiovascular Disease

## 2010-02-11 ENCOUNTER — Encounter: Payer: Self-pay | Admitting: Cardiovascular Disease

## 2010-02-11 ENCOUNTER — Inpatient Hospital Stay (HOSPITAL_COMMUNITY): Admission: AD | Admit: 2010-02-11 | Discharge: 2010-03-02 | Payer: Self-pay | Admitting: Cardiovascular Disease

## 2010-02-11 DIAGNOSIS — I5032 Chronic diastolic (congestive) heart failure: Secondary | ICD-10-CM

## 2010-02-11 DIAGNOSIS — I5030 Unspecified diastolic (congestive) heart failure: Secondary | ICD-10-CM | POA: Insufficient documentation

## 2010-02-13 ENCOUNTER — Encounter: Payer: Self-pay | Admitting: Cardiovascular Disease

## 2010-02-14 ENCOUNTER — Encounter: Payer: Self-pay | Admitting: Cardiovascular Disease

## 2010-02-16 ENCOUNTER — Encounter: Payer: Self-pay | Admitting: Thoracic Surgery (Cardiothoracic Vascular Surgery)

## 2010-02-16 ENCOUNTER — Encounter: Payer: Self-pay | Admitting: Cardiovascular Disease

## 2010-02-24 ENCOUNTER — Encounter: Payer: Self-pay | Admitting: Endocrinology

## 2010-02-25 ENCOUNTER — Encounter: Payer: Self-pay | Admitting: Thoracic Surgery (Cardiothoracic Vascular Surgery)

## 2010-03-12 ENCOUNTER — Ambulatory Visit: Payer: Self-pay | Admitting: Internal Medicine

## 2010-03-15 ENCOUNTER — Ambulatory Visit: Payer: Self-pay | Admitting: Thoracic Surgery (Cardiothoracic Vascular Surgery)

## 2010-03-15 ENCOUNTER — Encounter
Admission: RE | Admit: 2010-03-15 | Discharge: 2010-03-15 | Payer: Self-pay | Admitting: Thoracic Surgery (Cardiothoracic Vascular Surgery)

## 2010-03-15 ENCOUNTER — Encounter: Payer: Self-pay | Admitting: Cardiovascular Disease

## 2010-03-15 ENCOUNTER — Encounter: Payer: Self-pay | Admitting: Internal Medicine

## 2010-03-23 ENCOUNTER — Ambulatory Visit: Payer: Self-pay | Admitting: Cardiovascular Disease

## 2010-03-23 ENCOUNTER — Encounter: Payer: Self-pay | Admitting: Cardiovascular Disease

## 2010-03-23 LAB — CONVERTED CEMR LAB
GFR calc non Af Amer: 78.41 mL/min (ref >60.00)
Potassium: 4.1 meq/L (ref 3.5–5.1)
Sodium: 141 meq/L (ref 135–145)

## 2010-03-24 ENCOUNTER — Telehealth: Payer: Self-pay | Admitting: Cardiovascular Disease

## 2010-03-29 ENCOUNTER — Ambulatory Visit: Payer: Self-pay | Admitting: Thoracic Surgery (Cardiothoracic Vascular Surgery)

## 2010-04-02 ENCOUNTER — Ambulatory Visit: Payer: Self-pay | Admitting: Internal Medicine

## 2010-04-02 ENCOUNTER — Encounter: Payer: Self-pay | Admitting: Internal Medicine

## 2010-04-02 ENCOUNTER — Ambulatory Visit: Payer: Self-pay

## 2010-04-02 DIAGNOSIS — D649 Anemia, unspecified: Secondary | ICD-10-CM | POA: Insufficient documentation

## 2010-04-02 LAB — CONVERTED CEMR LAB
BUN: 22 mg/dL (ref 6–23)
Eosinophils Relative: 3 % (ref 0.0–5.0)
GFR calc non Af Amer: 70.24 mL/min (ref >60.00)
HCT: 33.4 % — ABNORMAL LOW (ref 36.0–46.0)
Hemoglobin: 11.1 g/dL — ABNORMAL LOW (ref 12.0–15.0)
Hgb A1c MFr Bld: 6.8 % — ABNORMAL HIGH (ref 4.6–6.5)
LDL Cholesterol: 50 mg/dL (ref 0–99)
Lymphs Abs: 1.3 10*3/uL (ref 0.7–4.0)
Monocytes Relative: 6.7 % (ref 3.0–12.0)
Neutro Abs: 9.3 10*3/uL — ABNORMAL HIGH (ref 1.4–7.7)
Potassium: 3.8 meq/L (ref 3.5–5.1)
Sodium: 138 meq/L (ref 135–145)
VLDL: 23.6 mg/dL (ref 0.0–40.0)
WBC: 11.7 10*3/uL — ABNORMAL HIGH (ref 4.5–10.5)

## 2010-04-06 ENCOUNTER — Telehealth: Payer: Self-pay | Admitting: Internal Medicine

## 2010-04-13 ENCOUNTER — Telehealth: Payer: Self-pay | Admitting: Internal Medicine

## 2010-04-19 ENCOUNTER — Encounter: Payer: Self-pay | Admitting: Endocrinology

## 2010-04-19 ENCOUNTER — Ambulatory Visit
Admission: RE | Admit: 2010-04-19 | Discharge: 2010-04-19 | Payer: Self-pay | Source: Home / Self Care | Attending: Endocrinology | Admitting: Endocrinology

## 2010-04-19 LAB — CONVERTED CEMR LAB: Fructosamine: 312 umol/L — ABNORMAL HIGH

## 2010-04-22 ENCOUNTER — Ambulatory Visit
Admission: RE | Admit: 2010-04-22 | Discharge: 2010-04-22 | Payer: Self-pay | Source: Home / Self Care | Attending: Cardiovascular Disease | Admitting: Cardiovascular Disease

## 2010-04-29 ENCOUNTER — Encounter: Payer: Self-pay | Admitting: Cardiovascular Disease

## 2010-05-05 ENCOUNTER — Ambulatory Visit
Admission: RE | Admit: 2010-05-05 | Discharge: 2010-05-05 | Payer: Self-pay | Source: Home / Self Care | Attending: Endocrinology | Admitting: Endocrinology

## 2010-05-05 LAB — CONVERTED CEMR LAB: Blood Glucose, Fingerstick: 194

## 2010-05-09 LAB — CONVERTED CEMR LAB
ALT: 30 units/L (ref 0–35)
AST: 23 units/L (ref 0–37)
Albumin: 3.6 g/dL (ref 3.5–5.2)
Basophils Absolute: 0 10*3/uL (ref 0.0–0.1)
CO2: 28 meq/L (ref 19–32)
Chloride: 101 meq/L (ref 96–112)
Cholesterol: 152 mg/dL (ref 0–200)
Creatinine,U: 162.1 mg/dL
Direct LDL: 82.5 mg/dL
GFR calc non Af Amer: 89.96 mL/min (ref >60)
Glucose, Bld: 286 mg/dL — ABNORMAL HIGH (ref 70–99)
HCT: 37.1 % (ref 36.0–46.0)
Hemoglobin: 12.6 g/dL (ref 12.0–15.0)
Hgb A1c MFr Bld: 10.7 % — ABNORMAL HIGH (ref 4.6–6.5)
Lymphs Abs: 1.8 10*3/uL (ref 0.7–4.0)
MCV: 87.1 fL (ref 78.0–100.0)
Microalb Creat Ratio: 6.8 mg/g (ref 0.0–30.0)
Microalb, Ur: 11 mg/dL — ABNORMAL HIGH (ref 0.0–1.9)
Monocytes Relative: 5.8 % (ref 3.0–12.0)
Neutro Abs: 9.1 10*3/uL — ABNORMAL HIGH (ref 1.4–7.7)
Potassium: 4.2 meq/L (ref 3.5–5.1)
RDW: 13.7 % (ref 11.5–14.6)
Sodium: 138 meq/L (ref 135–145)
Specific Gravity, Urine: 1.025 (ref 1.000–1.030)
Urine Glucose: 100 mg/dL
VLDL: 43.2 mg/dL — ABNORMAL HIGH (ref 0.0–40.0)

## 2010-05-11 ENCOUNTER — Encounter: Payer: Self-pay | Admitting: Internal Medicine

## 2010-05-11 ENCOUNTER — Ambulatory Visit
Admission: RE | Admit: 2010-05-11 | Discharge: 2010-05-11 | Payer: Self-pay | Source: Home / Self Care | Attending: Internal Medicine | Admitting: Internal Medicine

## 2010-05-11 DIAGNOSIS — R300 Dysuria: Secondary | ICD-10-CM | POA: Insufficient documentation

## 2010-05-11 DIAGNOSIS — L299 Pruritus, unspecified: Secondary | ICD-10-CM | POA: Insufficient documentation

## 2010-05-11 LAB — CONVERTED CEMR LAB
Bilirubin Urine: NEGATIVE
Protein, U semiquant: NEGATIVE
Specific Gravity, Urine: 1.01
WBC Urine, dipstick: NEGATIVE

## 2010-05-13 NOTE — Assessment & Plan Note (Signed)
Summary: npv/ peritheral  edema  Medications Added POTASSIUM CHLORIDE CRYS CR 20 MEQ CR-TABS (POTASSIUM CHLORIDE CRYS CR) Take one tablet by mouth twice a day        Visit Type:  Initial Consult Primary Provider:  Oliver Barre  CC:  Follow-up.  History of Present Illness: This is a 77 year-old woman with CAD s/p multivessel PCI in 2009. She was treated with drug-eluting stents at that time. The patient has been followed by Dr Jonny Ruiz, and recently presented with worsening leg swelling, dyspnea, and fatigue. Her lasix was increased and other meds were adjusted. She reports improvement in leg swelling, but continue to have dyspnea with low-level exertion. No chest pain, palps, or other complaints. She was taken off of beta blockers because of marked bradycardia and off of diovan because it exacerbated her leg swelling.  Current Medications (verified): 1)  Aurora Lancet Super Thin 30g  Misc (Lancets) .... Use 1 Stick As Directed Once  A Day 2)  Indomethacin 50 Mg Caps (Indomethacin) .Marland Kitchen.. 1 By Mouth Three Times A Day As Needed Gout 3)  Diclofenac Sodium 75 Mg Tbec (Diclofenac Sodium) .... Take 1 Tablet By Mouth Twice A Day 4)  Furosemide 80 Mg Tabs (Furosemide) .Marland Kitchen.. 1 By Mouth Qam 5)  Onetouch Ultra Test   Strp (Glucose Blood) .... Use Asd 1 Strip Tid 6)  Omeprazole 20 Mg  Cpdr (Omeprazole) .... 2 By Mouth Once Daily 7)  Plavix 75 Mg  Tabs (Clopidogrel Bisulfate) .Marland Kitchen.. 1po Qd 8)  Ecotrin Low Strength 81 Mg  Tbec (Aspirin) .Marland Kitchen.. 1po Qd 9)  Nitroquick 0.4 Mg  Subl (Nitroglycerin) .... Use Asd 10)  Pravachol 40 Mg  Tabs (Pravastatin Sodium) .Marland Kitchen.. 1 By Mouth Qd 11)  Potassium Chloride Crys Cr 20 Meq Cr-Tabs (Potassium Chloride Crys Cr) .... Take One Tablet By Mouth Twice A Day 12)  Allopurinol 100 Mg Tabs (Allopurinol) .Marland Kitchen.. 1po Once Daily 13)  Onetouch Ultra Test  Strp (Glucose Blood) .... Two Times A Day, and Lancets 250.00 14)  Relion Insulin Syringe 31g X 5/16" 0.5 Ml Misc (Insulin Syringe-Needle  U-100) .... Bid 15)  Oxycodone Hcl 5 Mg Tabs (Oxycodone Hcl) .Marland Kitchen.. 1  - 2  Tabs By Mouth Q 6 Hrs As Needed Pain 16)  Methocarbamol 500 Mg Tabs (Methocarbamol) .Marland Kitchen.. 1 Q6h As Needed Cramps 17)  Fexofenadine Hcl 180 Mg Tabs (Fexofenadine Hcl) .Marland Kitchen.. 1po Once Daily As Needed Allergies 18)  Humulin N 100 Unit/ml Susp (Insulin Isophane Human) .... 55 Units Am and 40 Units Pm  Allergies: 1)  ! Sulfa 2)  ! Pcn 3)  ! Metformin Hcl (Metformin Hcl) 4)  ! Cardizem La (Diltiazem Hcl Coated Beads) 5)  ! Lovastatin (Lovastatin) 6)  ! * Shellfish  Past History:  Past medical, surgical, family and social histories (including risk factors) reviewed, and no changes noted (except as noted below).  Past Medical History: Reviewed history from 10/30/2009 and no changes required. Hypertension Diabetes mellitus, type II Peripheral vascular disease Diverticulosis, colon Morbid Obesity Gout Depression Osteoarthritis Osteopenia Hyperlipidemia Low back pain/'lumbar disc dz/lumbar spinal stenosis cervical spine disc dz Anxiety GERD Barrett's esophagus gastroparesis hx of h pylori gastritis Coronary artery disease Allergic rhinitis  Past Surgical History: Reviewed history from 10/30/2009 and no changes required. Cholecystectomy Hysterectomy Tubal ligation Ovarian Cystectomies s/p stent x 3 - Dr Excell Seltzer  Family History: Reviewed history from 02/15/2007 and no changes required. mother  - dm, heart dz Family History of CAD Female 1st degree relative - 75  yo  Social History: Reviewed history from 09/11/2009 and no changes required. Former Smoker Alcohol use-no retired widowed 2004 Drug use-no  Review of Systems       Negative except as per HPI   Vital Signs:  Patient profile:   77 year old female Height:      67 inches Weight:      236 pounds BMI:     37.10 Pulse rate:   56 / minute Pulse rhythm:   regular Resp:     20 per minute BP sitting:   134 / 68  (left arm) Cuff size:    large  Vitals Entered By: Vikki Ports (November 26, 2009 10:30 AM)  Serial Vital Signs/Assessments:  Time      Position  BP       Pulse  Resp  Temp     By           R Arm     134/64                         Vikki Ports  CC: Follow-up Comments Patient is not taking Losartan any more due to side effects. Last dose few days ago.   Physical Exam  General:  Pt is alert and oriented, obese, African-American woman, in no acute distress. HEENT: normal Neck: normal carotid upstrokes with bilateral bruits, JVP normal Lungs: CTA CV: RRR without murmur or gallop Abd: soft, NT, positive BS, no bruit, no organomegaly Ext: trace bilateral pretibial edema. peripheral pulses 2+ and equal Skin: warm and dry without rash    EKG  Procedure date:  11/16/2009  Findings:      Study Conclusions            - Left ventricle: The cavity size was normal. Wall thickness was       normal. Systolic function was normal. The estimated ejection       fraction was in the range of 60% to 65%. Wall motion was normal;       there were no regional wall motion abnormalities. Doppler       parameters are consistent with abnormal left ventricular       relaxation (grade 1 diastolic dysfunction).     - Mitral valve: Calcified annulus. Mildly thickened leaflets . Mild       regurgitation.  EKG  Procedure date:  09/11/2009  Findings:      Sinus bradycardia with hr 55 bpm, otherwise within normal limits  Impression & Recommendations:  Problem # 1:  ACUTE ON CHRONIC DIASTOLIC HEART FAILURE (ICD-428.33) The patient has symptoms consistent with diastolic heart failure. She has exam findings consistent with only mild volume overload. Recommend check BMET and BNP today and follow-up with med adjustments after reviewing her lab values. Likely will increase furosemide to two times a day dosing. Also will consider another trial of an ACE or ARB. Will hold on beta blockers because of bradycardia. Will f/u in 3  months.  The following medications were removed from the medication list:    Losartan Potassium 100 Mg Tabs (Losartan potassium) .Marland Kitchen... 1po once daily Her updated medication list for this problem includes:    Furosemide 80 Mg Tabs (Furosemide) .Marland Kitchen... 1 by mouth qam    Plavix 75 Mg Tabs (Clopidogrel bisulfate) .Marland Kitchen... 1po qd    Ecotrin Low Strength 81 Mg Tbec (Aspirin) .Marland Kitchen... 1po qd    Nitroquick 0.4 Mg Subl (Nitroglycerin) ..... Use asd  Orders: TLB-BMP (Basic  Metabolic Panel-BMET) (80048-METABOL) TLB-BNP (B-Natriuretic Peptide) (83880-BNPR)  Problem # 2:  CORONARY ARTERY DISEASE (ICD-414.00) Stable after multivessel PCI a few years ago. She is having no angina and EKG remains normal. Continue DAPT with ASA and plavix indefinitely if she tolerates.  Her updated medication list for this problem includes:    Plavix 75 Mg Tabs (Clopidogrel bisulfate) .Marland Kitchen... 1po qd    Ecotrin Low Strength 81 Mg Tbec (Aspirin) .Marland Kitchen... 1po qd    Nitroquick 0.4 Mg Subl (Nitroglycerin) ..... Use asd  Orders: TLB-BMP (Basic Metabolic Panel-BMET) (80048-METABOL) TLB-BNP (B-Natriuretic Peptide) (83880-BNPR)  Problem # 3:  HYPERTENSION (ICD-401.9) BP controlled.  The following medications were removed from the medication list:    Losartan Potassium 100 Mg Tabs (Losartan potassium) .Marland Kitchen... 1po once daily Her updated medication list for this problem includes:    Furosemide 80 Mg Tabs (Furosemide) .Marland Kitchen... 1 by mouth qam    Ecotrin Low Strength 81 Mg Tbec (Aspirin) .Marland Kitchen... 1po qd  BP today: 134/68 Prior BP: 122/62 (11/02/2009)  Prior 10 Yr Risk Heart Disease: N/A (09/11/2009)  Labs Reviewed: K+: 4.2 (09/11/2009) Creat: : 0.8 (09/11/2009)   Chol: 152 (09/11/2009)   HDL: 40.00 (09/11/2009)   LDL: 78 (09/02/2008)   TG: 216.0 (09/11/2009)  Problem # 4:  HYPERLIPIDEMIA (ICD-272.4) Lipids at goal on pravastatin.  Her updated medication list for this problem includes:    Pravachol 40 Mg Tabs (Pravastatin sodium) .Marland Kitchen... 1  by mouth qd  CHOL: 152 (09/11/2009)   LDL: 78 (09/02/2008)   HDL: 40.00 (09/11/2009)   TG: 216.0 (09/11/2009)  Patient Instructions: 1)  Your physician recommends that you schedule a follow-up appointment in: 3 MONTHS with Dr Excell Seltzer 2)  Your physician recommends that you have  lab work today: BMP, BNP  3)  Your physician recommends that you continue on your current medications as directed. Please refer to the Current Medication list given to you today.

## 2010-05-13 NOTE — Progress Notes (Signed)
Summary: Rx refill req  Phone Note Refill Request Message from:  Patient on April 13, 2010 10:35 AM  Refills Requested: Medication #1:  ALLOPURINOL 100 MG TABS 1po once daily   Dosage confirmed as above?Dosage Confirmed   Supply Requested: 3 months  Method Requested: Electronic Initial call taken by: Margaret Pyle, CMA,  April 13, 2010 10:35 AM    Prescriptions: ALLOPURINOL 100 MG TABS (ALLOPURINOL) 1po once daily  #90 x 1   Entered by:   Margaret Pyle, CMA   Authorized by:   Corwin Levins MD   Signed by:   Margaret Pyle, CMA on 04/13/2010   Method used:   Electronically to        [facility] (retail)       [address]       [place], [place]  [address]       Ph: [phone]       Fax: [phone]   RxID:   [id number]

## 2010-05-13 NOTE — Letter (Signed)
Summary: [facility]  [facility]   Imported By: Lester Chenango 11/02/2009 09:18:00  _____________________________________________________________________  External Attachment:    Type:   Image     Comment:   External Document

## 2010-05-13 NOTE — Assessment & Plan Note (Signed)
Summary: 3 MTH FU---STC   Vital Signs:  Patient profile:   77 year old female Height:      67 inches (170.18 cm) Weight:      227.50 pounds (103.41 kg) BMI:     35.76 O2 Sat:      96 % on Room air Temp:     98.3 degrees F (36.83 degrees C) oral Pulse rate:   72 / minute Pulse rhythm:   regular BP sitting:   126 / 74  (left arm) Cuff size:   large  Vitals Entered By: Brenton Grills CMA Duncan Dull) (April 19, 2010 9:24 AM)  O2 Flow:  Room air CC: Follow up visit/Elevated CBG's/aj Is Patient Diabetic? Yes   Primary Provider:  Oliver Barre  CC:  Follow up visit/Elevated CBG's/aj.  History of Present Illness: she brings a record of her cbg's which i have reviewed today.  she has had hypoglycemia only twice, but she does not recall the time of day this was.  she takes only 30 units of insulin two times a day.  it was reduced when she was in the hospital for cabg.  most cbg's are in the 200's.  she has lost weight since last ov.     Current Medications (verified): 1)  Aurora Lancet Super Thin 30g  Misc (Lancets) .... Use 1 Stick As Directed Once  A Day 2)  Furosemide 80 Mg Tabs (Furosemide) .Marland Kitchen.. 1 By Mouth Qam 3)  Onetouch Ultra Test   Strp (Glucose Blood) .... Use Asd 1 Strip Tid 4)  Omeprazole 20 Mg  Cpdr (Omeprazole) .... 2 By Mouth Once Daily 5)  Aspirin Ec 325 Mg Tbec (Aspirin) .... Take One Tablet By Mouth Daily 6)  Nitroquick 0.4 Mg  Subl (Nitroglycerin) .... Use Asd 7)  Pravachol 40 Mg  Tabs (Pravastatin Sodium) .Marland Kitchen.. 1 By Mouth Qd 8)  Potassium Chloride Crys Cr 20 Meq Cr-Tabs (Potassium Chloride Crys Cr) .... Take One Tablet By Mouth Twice A Day 9)  Allopurinol 100 Mg Tabs (Allopurinol) .Marland Kitchen.. 1po Once Daily 10)  Onetouch Ultra Test  Strp (Glucose Blood) .... Two Times A Day, and Lancets 250.00 11)  Oxycodone Hcl 5 Mg Tabs (Oxycodone Hcl) .Marland Kitchen.. 1  - 2  Tabs By Mouth Q 6 Hrs As Needed Pain 12)  Methocarbamol 500 Mg Tabs (Methocarbamol) .Marland Kitchen.. 1 Q6h As Needed Cramps 13)  Fexofenadine  Hcl 180 Mg Tabs (Fexofenadine Hcl) .Marland Kitchen.. 1po Once Daily As Needed Allergies 14)  Humulin N 100 Unit/ml Susp (Insulin Isophane Human) .... 45 Units Am and 30 Units Pm 15)  Amlodipine Besylate 10 Mg Tabs (Amlodipine Besylate) .... Take One Tablet By Mouth Daily 16)  Losartan Potassium 100 Mg Tabs (Losartan Potassium) .... Take 1 Tablet By Mouth Once A Day 17)  Polysaccharide Iron Complex 150 Mg Caps (Polysaccharide Iron Complex) .... Take 1 Capsule By Mouth Once A Day 18)  Synthroid 100 Mcg Tabs (Levothyroxine Sodium) .... Take 1 Tablet By Mouth Once A Day 19)  Vitamin D2 400 Unit Tabs (Ergocalciferol) .... Take 1 Tablet By Mouth Once A Day 20)  Doxycycline Hyclate 100 Mg Caps (Doxycycline Hyclate) .Marland Kitchen.. 1 By Mouth Two Times A Day  Allergies (verified): 1)  ! Sulfa 2)  ! Pcn 3)  ! Metformin Hcl (Metformin Hcl) 4)  ! Cardizem La (Diltiazem Hcl Coated Beads) 5)  ! Lovastatin (Lovastatin) 6)  ! * Shellfish  Past History:  Past Medical History: Last updated: 04/02/2010 Multivessel coronary artery disease s/p PCI  with DES 2008 and CABG 2011. Hypertension Diabetes mellitus, type II Peripheral vascular disease Diverticulosis, colon Morbid Obesity Gout Depression Osteoarthritis Osteopenia Hyperlipidemia Low back pain/'lumbar disc dz/lumbar spinal stenosis cervical spine disc dz thrombocytopenia hx of Anxiety GERD Barrett's esophagus gastroparesis hx of h pylori gastritis Coronary artery disease Allergic rhinitis Anemia-NOS  Review of Systems  The patient denies syncope.    Physical Exam  General:  obese.  no distress  Pulses:  dorsalis pedis intact bilat.  Extremities:  no deformity.  no ulcer on the feet.  feet are of normal color and temp.   there are healing vein harvest scars on the legs  bilaterally. mycotic toenails.   trace right pedal edema and 1+ left pedal edema.   Neurologic:  sensation is intact to touch on the feet  Additional Exam:   Fructosamine          [H]  312 umol/L  (converts to a1c of 6.9)   Impression & Recommendations:  Problem # 1:  DIABETES MELLITUS, TYPE II (ICD-250.00) overcontrolled, given this regimen, which does match insulin to her changing needs throughout the day HgbA1C: 6.8 (04/02/2010)  Medications Added to Medication List This Visit: 1)  Humulin N 100 Unit/ml Susp (Insulin isophane human) .... 30 units am and 25 units pm  Other Orders: T-Fructosamine ([id number]) Est. Patient Level III (62952)  Patient Instructions: 1)  check your blood sugar 2 times a day.  vary the time of day when you check, between before the 3 meals, and at bedtime.  also check if you have symptoms of your blood sugar being too high or too low.  please keep a record of the readings and bring it to your next appointment here.  please call us sooner if you are having low blood sugar episodes. 2)  blood tests are being ordered for you today.  please call [phone] to hear your test results. 3)  return 3 months. 4)  pending the test results, please continue the same insulin for now. 5)  (update: i left message on phone-tree:  reduce insulin to 30 units am and 25 units pm).   Orders Added: 1)  T-Fructosamine [[id number]] 2)  Est. Patient Level III [01027]

## 2010-05-13 NOTE — Assessment & Plan Note (Signed)
Summary: HEADACHE  STC   Vital Signs:  Patient profile:   77 year old female Height:      67 inches Weight:      244 pounds BMI:     38.35 O2 Sat:      99 % on Room air Temp:     97.9 degrees F oral Pulse rate:   66 / minute BP sitting:   172 / 80  (left arm) Cuff size:   large  Vitals Entered ByZella Ball Ewing (May 11, 2009 1:05 PM)  O2 Flow:  Room air CC: headache/RE   Primary Care Provider:  Oliver Barre  CC:  headache/RE.  History of Present Illness: colchrys and benicar too expensive;  here with painful rash to the right temple area with mild erythema, with pain seeming to radiate to the right ear area, all for 2 days, wihtout fever, n/v, ST, cough, and Pt denies CP, sob, doe, wheezing, orthopnea, pnd, worsening LE edema, palps, dizziness or syncope   Pt denies new neuro symptoms such as other headache, facial or extremity weakness   Pt denies polydipsia, polyuria, or low sugar symptoms such as shakiness improved with eating.  Overall good compliance with meds, trying to follow low chol, DM diet, wt stable, little excercise however   Problems Prior to Update: 1)  Shingles  (ICD-053.9) 2)  Shoulder Pain, Left  (ICD-719.41) 3)  Proteinuria  (ICD-791.0) 4)  Preventive Health Care  (ICD-V70.0) 5)  Abdominal Pain, Unspecified Site  (ICD-789.00) 6)  Finger Pain  (ICD-729.5) 7)  Coronary Artery Disease  (ICD-414.00) 8)  Chest Pain  (ICD-786.50) 9)  Dizziness  (ICD-780.4) 10)  Peripheral Edema  (ICD-782.3) 11)  Family History of Cad Female 1st Degree Relative <50  (ICD-V17.3) 12)  Spinal Stenosis, Lumbar  (ICD-724.02) 13)  Disc Disease, Cervical  (ICD-722.4) 14)  Helicobacter Pylori Gastritis, Hx of  (ICD-V12.79) 15)  Gastroparesis  (ICD-536.3) 16)  Barrett's Esophagus, Hx of  (ICD-V12.79) 17)  Gerd  (ICD-530.81) 18)  Disc Disease, Lumbar  (ICD-722.52) 19)  Anxiety  (ICD-300.00) 20)  Low Back Pain  (ICD-724.2) 21)  Hyperlipidemia  (ICD-272.4) 22)  Osteopenia   (ICD-733.90) 23)  Osteoarthritis  (ICD-715.90) 24)  Depression  (ICD-311) 25)  Gout  (ICD-274.9) 26)  Morbid Obesity  (ICD-278.01) 27)  Diverticulosis, Colon  (ICD-562.10) 28)  Peripheral Vascular Disease  (ICD-443.9) 29)  Diabetes Mellitus, Type II  (ICD-250.00) 30)  Hypertension  (ICD-401.9)  Medications Prior to Update: 1)  Aurora Lancet Super Thin 30g  Misc (Lancets) .... Use 1 Stick As Directed Once  A Day 2)  Colchicine 0.6 Mg Tabs (Colchicine) .... Take 1 Tablet By Mouth Twice A Day 3)  Diclofenac Sodium 75 Mg Tbec (Diclofenac Sodium) .... Take 1 Tablet By Mouth Twice A Day 4)  Furosemide 80 Mg Tabs (Furosemide) .Marland Kitchen.. 1 By Mouth Qd 5)  Onetouch Ultra Test   Strp (Glucose Blood) .... Use Asd 1 Strip Tid 6)  Omeprazole 20 Mg  Cpdr (Omeprazole) .... 2 By Mouth Once Daily 7)  Plavix 75 Mg  Tabs (Clopidogrel Bisulfate) .Marland Kitchen.. 1po Qd 8)  Ecotrin Low Strength 81 Mg  Tbec (Aspirin) .Marland Kitchen.. 1po Qd 9)  Nitroquick 0.4 Mg  Subl (Nitroglycerin) .... Use Asd 10)  Pravachol 40 Mg  Tabs (Pravastatin Sodium) .Marland Kitchen.. 1 By Mouth Qd 11)  Klor-Con 20 Meq Pack (Potassium Chloride) .... Take 1 Tablet By Mouth Two Times A Day 12)  Allopurinol 100 Mg Tabs (Allopurinol) .Marland Kitchen.. 1po Once Daily 13)  Onetouch Ultra Test  Strp (Glucose Blood) .... Two Times A Day, and Lancets 250.00 14)  Novolin 70/30 70-30 % Susp (Insulin Isophane & Regular) .... 50 Units Qam and 35 Units Qpm 15)  Relion Insulin Syringe 31g X 5/16" 0.5 Ml Misc (Insulin Syringe-Needle U-100) .... Bid 16)  Amlodipine Besylate 10 Mg Tabs (Amlodipine Besylate) .Marland Kitchen.. 1po Once Daily 17)  Oxycodone Hcl 5 Mg Tabs (Oxycodone Hcl) .Marland Kitchen.. 1  -  3 Tabs By Mouth Q 6 Hrs As Needed Pain 18)  Benicar 20 Mg Tabs (Olmesartan Medoxomil) .... 1/2 Tab Qd 19)  Methocarbamol 500 Mg Tabs (Methocarbamol) .Marland Kitchen.. 1 Q6h As Needed Cramps  Current Medications (verified): 1)  Aurora Lancet Super Thin 30g  Misc (Lancets) .... Use 1 Stick As Directed Once  A Day 2)  Indomethacin 50 Mg  Caps (Indomethacin) .Marland Kitchen.. 1 By Mouth Three Times A Day As Needed Gout 3)  Diclofenac Sodium 75 Mg Tbec (Diclofenac Sodium) .... Take 1 Tablet By Mouth Twice A Day 4)  Furosemide 80 Mg Tabs (Furosemide) .Marland Kitchen.. 1 By Mouth Qd 5)  Onetouch Ultra Test   Strp (Glucose Blood) .... Use Asd 1 Strip Tid 6)  Omeprazole 20 Mg  Cpdr (Omeprazole) .... 2 By Mouth Once Daily 7)  Plavix 75 Mg  Tabs (Clopidogrel Bisulfate) .Marland Kitchen.. 1po Qd 8)  Ecotrin Low Strength 81 Mg  Tbec (Aspirin) .Marland Kitchen.. 1po Qd 9)  Nitroquick 0.4 Mg  Subl (Nitroglycerin) .... Use Asd 10)  Pravachol 40 Mg  Tabs (Pravastatin Sodium) .Marland Kitchen.. 1 By Mouth Qd 11)  Klor-Con 20 Meq Pack (Potassium Chloride) .... Take 1 Tablet By Mouth Two Times A Day 12)  Allopurinol 100 Mg Tabs (Allopurinol) .Marland Kitchen.. 1po Once Daily 13)  Onetouch Ultra Test  Strp (Glucose Blood) .... Two Times A Day, and Lancets 250.00 14)  Novolin 70/30 70-30 % Susp (Insulin Isophane & Regular) .... 50 Units Qam and 35 Units Qpm 15)  Relion Insulin Syringe 31g X 5/16" 0.5 Ml Misc (Insulin Syringe-Needle U-100) .... Bid 16)  Amlodipine Besylate 10 Mg Tabs (Amlodipine Besylate) .Marland Kitchen.. 1po Once Daily 17)  Oxycodone Hcl 5 Mg Tabs (Oxycodone Hcl) .Marland Kitchen.. 1  -  3 Tabs By Mouth Q 6 Hrs As Needed Pain 18)  Losartan Potassium 100 Mg Tabs (Losartan Potassium) .Marland Kitchen.. 1po Once Daily 19)  Methocarbamol 500 Mg Tabs (Methocarbamol) .Marland Kitchen.. 1 Q6h As Needed Cramps 20)  Valacyclovir Hcl 500 Mg Tabs (Valacyclovir Hcl) .... 2 Tabs By Mouth Three Times A Day For 7 Days  Allergies (verified): 1)  ! Sulfa 2)  ! Pcn 3)  ! Metformin Hcl (Metformin Hcl) 4)  ! Cardizem La (Diltiazem Hcl Coated Beads) 5)  ! Lovastatin (Lovastatin) 6)  ! * Shellfish  Past History:  Past Medical History: Last updated: 05/03/2007 Hypertension Diabetes mellitus, type II Peripheral vascular disease Diverticulosis, colon Morbid Obesity Gout Depression Osteoarthritis Osteopenia Hyperlipidemia Low back pain/'lumbar disc dz/lumbar spinal  stenosis cervical spine disc dz Anxiety GERD Barrett's esophagus gastroparesis hx of h pylori gastritis Coronary artery disease  Past Surgical History: Last updated: 05/03/2007 Cholecystectomy Hysterectomy Tubal ligation Ovarian Cystectomies s/p stent x 3  Social History: Last updated: 12/28/2007 Former Smoker Alcohol use-no retired widowed 2004  Risk Factors: Smoking Status: quit (02/15/2007)  Review of Systems       all otherwise negative per pt -  Physical Exam  General:  alert and overweight-appearing.   Head:  normocephalic and atraumatic.   Eyes:  vision grossly intact, pupils equal, and pupils round.  Ears:  R ear normal and L ear normal.   Nose:  no external deformity and no nasal discharge.   Mouth:  no gingival abnormalities and pharynx pink and moist.   Neck:  supple and no masses.   Lungs:  normal respiratory effort and normal breath sounds.   Heart:  normal rate and regular rhythm.   Msk:  no joint tenderness.   Extremities:  no edema, no erythema  Neurologic:  cranial nerves II-XII intact and strength normal in all extremities.   Skin:  typical grouped vesicles on erythem base to right temple area    Impression & Recommendations:  Problem # 1:  HYPERTENSION (ICD-401.9)  Her updated medication list for this problem includes:    Furosemide 80 Mg Tabs (Furosemide) .Marland Kitchen... 1 by mouth qd    Amlodipine Besylate 10 Mg Tabs (Amlodipine besylate) .Marland Kitchen... 1po once daily    Losartan Potassium 100 Mg Tabs (Losartan potassium) .Marland Kitchen... 1po once daily uncontrolled off the benicar - to change to generic, and f/u BP at home and next visit  Problem # 2:  GOUT (ICD-274.9)  Her updated medication list for this problem includes:    Allopurinol 100 Mg Tabs (Allopurinol) .Marland Kitchen... 1po once daily to stop the colchicine as no longer avail on [place] market;  gave indocin only for as needed use, no current acute at this time  recent uric acid normal  Problem # 3:  SHINGLES  (ICD-053.9) right temple area - for valtrex, pain med; avoid small children who have not been exposed;  consider gabapentin if pain persists  Problem # 4:  DIABETES MELLITUS, TYPE II (ICD-250.00)  Her updated medication list for this problem includes:    Ecotrin Low Strength 81 Mg Tbec (Aspirin) .Marland Kitchen... 1po qd    Novolin 70/30 70-30 % Susp (Insulin isophane & regular) .Marland KitchenMarland KitchenMarland KitchenMarland Kitchen 50 units qam and 35 units qpm    Losartan Potassium 100 Mg Tabs (Losartan potassium) .Marland Kitchen... 1po once daily  Labs Reviewed: Creat: 0.8 (03/20/2009)    Reviewed HgBA1c results: 9.9 (03/13/2009)  10.6 (12/12/2008) asympt but may be related to shingles coming out if uncontrolled; advised to watch sugars closely and f/u dr Devra Dopp planned  Complete Medication List: 1)  Aurora Lancet Super Thin 30g Misc (Lancets) .... Use 1 stick as directed once  a day 2)  Indomethacin 50 Mg Caps (Indomethacin) .Marland Kitchen.. 1 by mouth three times a day as needed gout 3)  Diclofenac Sodium 75 Mg Tbec (Diclofenac sodium) .... Take 1 tablet by mouth twice a day 4)  Furosemide 80 Mg Tabs (Furosemide) .Marland Kitchen.. 1 by mouth qd 5)  Onetouch Ultra Test Strp (Glucose blood) .... Use asd 1 strip tid 6)  Omeprazole 20 Mg Cpdr (Omeprazole) .... 2 by mouth once daily 7)  Plavix 75 Mg Tabs (Clopidogrel bisulfate) .Marland Kitchen.. 1po qd 8)  Ecotrin Low Strength 81 Mg Tbec (Aspirin) .Marland Kitchen.. 1po qd 9)  Nitroquick 0.4 Mg Subl (Nitroglycerin) .... Use asd 10)  Pravachol 40 Mg Tabs (Pravastatin sodium) .Marland Kitchen.. 1 by mouth qd 11)  Klor-con 20 Meq Pack (Potassium chloride) .... Take 1 tablet by mouth two times a day 12)  Allopurinol 100 Mg Tabs (Allopurinol) .Marland Kitchen.. 1po once daily 13)  Onetouch Ultra Test Strp (Glucose blood) .... Two times a day, and lancets 250.00 14)  Novolin 70/30 70-30 % Susp (Insulin isophane & regular) .... 50 units qam and 35 units qpm 15)  Relion Insulin Syringe 31g X 5/16" 0.5 Ml Misc (Insulin syringe-needle u-100) .... Bid 16)  Amlodipine Besylate 10 Mg Tabs  (Amlodipine besylate) .Marland Kitchen.. 1po once daily 17)  Oxycodone Hcl 5 Mg Tabs (Oxycodone hcl) .Marland Kitchen.. 1  -  3 tabs by mouth q 6 hrs as needed pain 18)  Losartan Potassium 100 Mg Tabs (Losartan potassium) .Marland Kitchen.. 1po once daily 19)  Methocarbamol 500 Mg Tabs (Methocarbamol) .Marland Kitchen.. 1 q6h as needed cramps 20)  Valacyclovir Hcl 500 Mg Tabs (Valacyclovir hcl) .... 2 tabs by mouth three times a day for 7 days  Patient Instructions: 1)  stop the benicar 2)  start the losartan 100 mg per day (I sent to [facility], as well as the 90 day prescription given today) 3)  Please take all new medications as prescribed - the generic valtrex (antibiotic - sent to the pharmacy) 4)  your pain medication was refilled today (in hardcopy at the office) 5)  take the indomethacin for gout only if needed (sent on the computer to [facility]) 6)  Continue all previous medications as before this visit  7)  please continue to follow with  Dr Everardo All as planned 8)  Please schedule a follow-up appointment in 4 months with CPX labs and :  9)  HbgA1C prior to visit, ICD-9: 250.02 10)  Urine Microalbumin prior to visit, ICD-9: Prescriptions: OXYCODONE HCL 5 MG TABS (OXYCODONE HCL) 1  -  3 tabs by mouth q 6 hrs as needed pain  #100 x 0   Entered and Authorized by:   Corwin Levins MD   Signed by:   Corwin Levins MD on 05/11/2009   Method used:   Print then Give to Patient   RxID:   [id number] VALACYCLOVIR HCL 500 MG TABS (VALACYCLOVIR HCL) 2 tabs by mouth three times a day for 7 days  #42 x 0   Entered and Authorized by:   Corwin Levins MD   Signed by:   Corwin Levins MD on 05/11/2009   Method used:   Electronically to        [facility] (retail)       [address]       [place], [place]  [address]       Ph: [phone]       Fax: [phone]   RxID:   [id number] LOSARTAN POTASSIUM 100 MG TABS (LOSARTAN POTASSIUM) 1po once daily  #90 x 3   Entered and Authorized by:   Corwin Levins MD   Signed by:    Corwin Levins MD on 05/11/2009   Method used:   Print then Give to Patient   RxID:   [id number] KGURKYHC POTASSIUM 100 MG TABS (LOSARTAN POTASSIUM) 1po once daily  #90 x 3   Entered and Authorized by:   Corwin Levins MD   Signed by:   Corwin Levins MD on 05/11/2009   Method used:   Electronically to        [facility] (retail)       [address]       [place], [place]  [address]       Ph: [phone]       Fax: [phone]   RxID:   [id number] INDOMETHACIN 50 MG CAPS (INDOMETHACIN) 1 by mouth three times a day as needed gout  #90 x 2   Entered and Authorized by:   Corwin Levins MD   Signed by:  Corwin Levins MD on 05/11/2009   Method used:   Electronically to        [facility] (retail)       [address]       [place], [place]  [address]       Ph: [phone]       Fax: [phone]   RxID:   [id number]

## 2010-05-13 NOTE — Assessment & Plan Note (Signed)
Summary: ROV   Visit Type:  Follow-up Primary Provider:  Oliver Barre  CC:  none.  History of Present Illness: This is a 77 year-old woman with CAD s/p multivessel PCI in 2009. She was treated with drug-eluting stents at that time. She then presented with unstable angina and was hospitalized last month. She was found to have recurrent severe multivessel disease and underwent multivessel CABG by Dr Dorris Fetch. She presents today for hospital followup evaluation.  She complains of weakness and fatigue. Denies chest burning or pain. Exertional dyspnea unchanged. Complains of left leg swelling ever since surgery. No other complaints at present.   Current Medications (verified): 1)  Aurora Lancet Super Thin 30g  Misc (Lancets) .... Use 1 Stick As Directed Once  A Day 2)  Furosemide 80 Mg Tabs (Furosemide) .Marland Kitchen.. 1 By Mouth Qam 3)  Onetouch Ultra Test   Strp (Glucose Blood) .... Use Asd 1 Strip Tid 4)  Omeprazole 20 Mg  Cpdr (Omeprazole) .... 2 By Mouth Once Daily 5)  Aspirin Ec 325 Mg Tbec (Aspirin) .... Take One Tablet By Mouth Daily 6)  Nitroquick 0.4 Mg  Subl (Nitroglycerin) .... Use Asd 7)  Pravachol 40 Mg  Tabs (Pravastatin Sodium) .Marland Kitchen.. 1 By Mouth Qd 8)  Potassium Chloride Crys Cr 20 Meq Cr-Tabs (Potassium Chloride Crys Cr) .... Take One Tablet By Mouth Twice A Day 9)  Allopurinol 100 Mg Tabs (Allopurinol) .Marland Kitchen.. 1po Once Daily 10)  Onetouch Ultra Test  Strp (Glucose Blood) .... Two Times A Day, and Lancets 250.00 11)  Oxycodone Hcl 5 Mg Tabs (Oxycodone Hcl) .Marland Kitchen.. 1  - 2  Tabs By Mouth Q 6 Hrs As Needed Pain 12)  Methocarbamol 500 Mg Tabs (Methocarbamol) .Marland Kitchen.. 1 Q6h As Needed Cramps 13)  Fexofenadine Hcl 180 Mg Tabs (Fexofenadine Hcl) .Marland Kitchen.. 1po Once Daily As Needed Allergies 14)  Humulin N 100 Unit/ml Susp (Insulin Isophane Human) .... 30 Units Am and 25 Units Pm 15)  Amlodipine Besylate 10 Mg Tabs (Amlodipine Besylate) .... Take One Tablet By Mouth Daily 16)  Losartan Potassium 100 Mg Tabs  (Losartan Potassium) .... Take 1 Tablet By Mouth Once A Day 17)  Polysaccharide Iron Complex 150 Mg Caps (Polysaccharide Iron Complex) .... Take 1 Capsule By Mouth Once A Day 18)  Synthroid 100 Mcg Tabs (Levothyroxine Sodium) .... Take 1 Tablet By Mouth Once A Day 19)  Vitamin D2 400 Unit Tabs (Ergocalciferol) .... Take 1 Tablet By Mouth Once A Day  Allergies: 1)  ! Sulfa 2)  ! Pcn 3)  ! Metformin Hcl (Metformin Hcl) 4)  ! Cardizem La (Diltiazem Hcl Coated Beads) 5)  ! Lovastatin (Lovastatin) 6)  ! * Shellfish  Past History:  Past medical history reviewed for relevance to current acute and chronic problems.  Past Medical History: Reviewed history from 04/02/2010 and no changes required. Multivessel coronary artery disease s/p PCI with DES 2008 and CABG 2011. Hypertension Diabetes mellitus, type II Peripheral vascular disease Diverticulosis, colon Morbid Obesity Gout Depression Osteoarthritis Osteopenia Hyperlipidemia Low back pain/'lumbar disc dz/lumbar spinal stenosis cervical spine disc dz thrombocytopenia hx of Anxiety GERD Barrett's esophagus gastroparesis hx of h pylori gastritis Coronary artery disease Allergic rhinitis Anemia-NOS  Review of Systems       Negative except as per HPI   Vital Signs:  Patient profile:   77 year old female Height:      67 inches Weight:      226.25 pounds BMI:     35.56 Pulse rate:  64 / minute Pulse rhythm:   regular Resp:     18 per minute BP sitting:   134 / 70  (left arm) Cuff size:   large  Vitals Entered By: Vikki Ports (April 22, 2010 10:20 AM)  Physical Exam  General:  Pt is alert and oriented, obese woman in no acute distress. HEENT: normal Neck: normal carotid upstrokes with bilateral bruits, JVP normal Lungs: CTA CV: RRR without murmur or gallop Abd: soft, NT, positive BS, no bruit, no organomegaly Ext: 1+ left pretibial edema. peripheral pulses 2+ and equal Skin: warm and dry without  rash     Impression & Recommendations:  Problem # 1:  ACUTE ON CHRONIC DIASTOLIC HEART FAILURE (ICD-428.33) Pt appears stable from a volume standpoint. Suspect fatigue/weakness related to recent CABG, but she is making slow improvement. Will continue current meds and followup in 2 months.  Her updated medication list for this problem includes:    Furosemide 80 Mg Tabs (Furosemide) .Marland Kitchen... 1 by mouth qam    Aspirin Ec 325 Mg Tbec (Aspirin) .Marland Kitchen... Take one tablet by mouth daily    Nitroquick 0.4 Mg Subl (Nitroglycerin) ..... Use asd    Amlodipine Besylate 10 Mg Tabs (Amlodipine besylate) .Marland Kitchen... Take one tablet by mouth daily    Losartan Potassium 100 Mg Tabs (Losartan potassium) .Marland Kitchen... Take 1 tablet by mouth once a day  Problem # 2:  CORONARY ARTERY DISEASE (ICD-414.00) Angina resolved since CABG.  Her updated medication list for this problem includes:    Aspirin Ec 325 Mg Tbec (Aspirin) .Marland Kitchen... Take one tablet by mouth daily    Nitroquick 0.4 Mg Subl (Nitroglycerin) ..... Use asd    Amlodipine Besylate 10 Mg Tabs (Amlodipine besylate) .Marland Kitchen... Take one tablet by mouth daily  Problem # 3:  HYPERLIPIDEMIA (ICD-272.4) Lipids at goal.  Her updated medication list for this problem includes:    Pravachol 40 Mg Tabs (Pravastatin sodium) .Marland Kitchen... 1 by mouth qd  CHOL: 109 (04/02/2010)   LDL: 50 (04/02/2010)   HDL: 35.00 (04/02/2010)   TG: 118.0 (04/02/2010)  Patient Instructions: 1)  Your physician recommends that you continue on your current medications as directed. Please refer to the Current Medication list given to you today. 2)  Your physician wants you to follow-up in: 4 MONTHS.  You will receive a reminder letter in the mail two months in advance. If you don't receive a letter, please call our office to schedule the follow-up appointment. 3)  Please get a Knee high mild compression stocking for left leg.  (order given) Prescriptions: LOSARTAN POTASSIUM 100 MG TABS (LOSARTAN POTASSIUM) Take 1  tablet by mouth once a day  #90 x 3   Entered by:   Julieta Gutting, RN, BSN   Authorized by:   Norva Karvonen, MD   Signed by:   Julieta Gutting, RN, BSN on 04/22/2010   Method used:   Electronically to        [facility] (retail)       [address]       [place], [place]  [address]       Ph: [phone]       Fax: [phone]   RxID:   [id number]

## 2010-05-13 NOTE — Progress Notes (Signed)
  Phone Note Refill Request  on August 06, 2009 9:56 AM  Refills Requested: Medication #1:  DICLOFENAC SODIUM 75 MG TBEC Take 1 tablet by mouth twice a day   Dosage confirmed as above?Dosage Confirmed   Notes: [facility] Initial call taken by: Scharlene Gloss,  August 06, 2009 9:56 AM    Prescriptions: ALLOPURINOL 100 MG TABS (ALLOPURINOL) 1po once daily  #90 x 3   Entered by:   Scharlene Gloss   Authorized by:   Corwin Levins MD   Signed by:   Scharlene Gloss on 08/06/2009   Method used:   Faxed to ...       [facility] ([facility]-order)       [address]       [place], [place]         Ph: [phone]       Fax: [phone]   RxID:   [id number] PRAVACHOL 40 MG  TABS (PRAVASTATIN SODIUM) 1 by mouth qd  #90 x 3   Entered by:   Scharlene Gloss   Authorized by:   Corwin Levins MD   Signed by:   Scharlene Gloss on 08/06/2009   Method used:   Faxed to ...       [facility] (mail-order)       [address]       [place], [place]         Ph: [phone]       Fax: [phone]   RxID:   [id number] OMEPRAZOLE 20 MG  CPDR (OMEPRAZOLE) 2 by mouth once daily  #180 x 3   Entered by:   Scharlene Gloss   Authorized by:   Corwin Levins MD   Signed by:   Scharlene Gloss on 08/06/2009   Method used:   Faxed to ...       [facility] (mail-order)       [address] [place], [place]         Ph: [phone]       Fax: [phone]   RxID:   [id number] DICLOFENAC SODIUM 75 MG TBEC (DICLOFENAC SODIUM) Take 1 tablet by mouth twice a day  #180 x 3   Entered by:   Scharlene Gloss   Authorized by:   Corwin Levins MD   Signed by:   Scharlene Gloss on 08/06/2009   Method used:   Faxed to ...       [facility] (mail-order)       [address]       [place], [place]         Ph: [phone]       Fax: [phone]   RxID:   [id number]

## 2010-05-13 NOTE — Assessment & Plan Note (Signed)
Summary: ROV   Visit Type:  Follow-up Primary Provider:  Oliver Barre  CC:  Right leg edema and Sob.  History of Present Illness: This is a 77 year-old woman with CAD s/p multivessel PCI in 2009. She was treated with drug-eluting stents at that time.   She has done well since then, but has developed exertional dyspnea and chest pain over the past 2 weeks. Describes the chest pain as a 'burning' in the center of her chest. Symptoms occur at low level activity (walking from lobby into the exam room today) and resolve with rest.   She complains of leg swelling over the past several days, Right > Left. Also has chronic orthopnea but denies PND.  Current Medications (verified): 1)  Aurora Lancet Super Thin 30g  Misc (Lancets) .... Use 1 Stick As Directed Once  A Day 2)  Diclofenac Sodium 75 Mg Tbec (Diclofenac Sodium) .... Take 1 Tablet By Mouth Twice A Day 3)  Furosemide 80 Mg Tabs (Furosemide) .Marland Kitchen.. 1 By Mouth Qam 4)  Onetouch Ultra Test   Strp (Glucose Blood) .... Use Asd 1 Strip Tid 5)  Omeprazole 20 Mg  Cpdr (Omeprazole) .... 2 By Mouth Once Daily 6)  Plavix 75 Mg  Tabs (Clopidogrel Bisulfate) .Marland Kitchen.. 1po Qd 7)  Ecotrin Low Strength 81 Mg  Tbec (Aspirin) .Marland Kitchen.. 1po Qd 8)  Nitroquick 0.4 Mg  Subl (Nitroglycerin) .... Use Asd 9)  Pravachol 40 Mg  Tabs (Pravastatin Sodium) .Marland Kitchen.. 1 By Mouth Qd 10)  Potassium Chloride Crys Cr 20 Meq Cr-Tabs (Potassium Chloride Crys Cr) .... Take One Tablet By Mouth Twice A Day 11)  Allopurinol 100 Mg Tabs (Allopurinol) .Marland Kitchen.. 1po Once Daily 12)  Onetouch Ultra Test  Strp (Glucose Blood) .... Two Times A Day, and Lancets 250.00 13)  Relion Insulin Syringe 31g X 5/16" 0.5 Ml Misc (Insulin Syringe-Needle U-100) .... Bid 14)  Oxycodone Hcl 5 Mg Tabs (Oxycodone Hcl) .Marland Kitchen.. 1  - 2  Tabs By Mouth Q 6 Hrs As Needed Pain 15)  Methocarbamol 500 Mg Tabs (Methocarbamol) .Marland Kitchen.. 1 Q6h As Needed Cramps 16)  Fexofenadine Hcl 180 Mg Tabs (Fexofenadine Hcl) .Marland Kitchen.. 1po Once Daily As Needed  Allergies 17)  Humulin N 100 Unit/ml Susp (Insulin Isophane Human) .... 80 Units Am and 30 Units Pm 18)  Amlodipine Besylate 5 Mg Tabs (Amlodipine Besylate) .Marland Kitchen.. 1 By Mouth Once Daily  Allergies: 1)  ! Sulfa 2)  ! Pcn 3)  ! Metformin Hcl (Metformin Hcl) 4)  ! Cardizem La (Diltiazem Hcl Coated Beads) 5)  ! Lovastatin (Lovastatin) 6)  ! * Shellfish  Past History:  Past medical history reviewed for relevance to current acute and chronic problems.  Past Medical History: Multivessel coronary artery disease s/p PCI with DES Hypertension Diabetes mellitus, type II Peripheral vascular disease Diverticulosis, colon Morbid Obesity Gout Depression Osteoarthritis Osteopenia Hyperlipidemia Low back pain/'lumbar disc dz/lumbar spinal stenosis cervical spine disc dz thrombocytopenia hx of Anxiety GERD Barrett's esophagus gastroparesis hx of h pylori gastritis Coronary artery disease Allergic rhinitis  Review of Systems       Negative except as per HPI   Vital Signs:  Patient profile:   77 year old female Height:      67 inches Weight:      241.75 pounds BMI:     38.00 O2 Sat:      96 % on Room air Pulse rate:   50 / minute Resp:     20 per minute BP sitting:  140 / 72  (left arm) Cuff size:   large  Vitals Entered By: Vikki Ports (February 11, 2010 9:21 AM)  O2 Flow:  Room air  Serial Vital Signs/Assessments:  Time      Position  BP       Pulse  Resp  Temp     By           R Arm     140/68                         Vikki Ports   Physical Exam  General:  Pt is alert and oriented, obese African-American woman in no acute distress. HEENT: normal Neck: normal carotid upstrokes with a loud left carotid bruit, JVP normal Lungs: bibasilar rales, good air movement CV: RRR without murmur or gallop Abd: soft, obese, NT, positive BS, no bruit, no organomegaly Ext: 2+ right pretibial edema, 1+ left pretibial edema. peripheral pulses 2+ and equal Skin: warm and dry  without rash    EKG  Procedure date:  02/11/2010  Findings:      Sinus brady 50 bpm, otherwise within normal limits  Cardiac Cath  Procedure date:  04/27/2007  Findings:       Initial   angiography demonstrated severe 95% stenosis of the proximal right   coronary artery.  There was a long segment of disease involved.   Angiomax was used for anticoagulation.  A Cougar guidewire was advanced   beyond the area of severe stenosis without difficulty. The lesion was   predilated with 2-0. x 15 mm Maverick balloon up to 10 atmospheres.   After balloon dilatation, the 2.75 x 28 mm Promus stent was placed and   was deployed at 12 atmospheres.  This stent covered the entire segment   of severe disease.  The stent was fairly well expanded, but there was   some residual waste in the distal aspect of the stent.  I used a 3.0 x   20 mm Quantum Maverick to post-dilate.  It was taken to 16 atmospheres   distally and 18 atmospheres proximally, a total of 3 inflations were   performed.  At completion of the procedure, there was a nice step-down   off the distal edge of the stent. There was TIMI III flow and an   excellent angiographic result.  At that point, a 6-French CLS 3.5 cm   guide catheter was inserted into the left main.  The wire was passed   beyond the LAD lesion.  There was also a relatively long segment of   disease throughout the proximal LAD.  The entire LAD is diffusely   diseased, but the only severe stenosis was in the proximal vessel.  The   lesion was predilated with the same 2.0 x 15 mm Maverick.  I  then   stented that area with a 2.5 x 23 mm Promus stent which was deployed at   12 atmospheres.  The stent was post-dilated with a 2.5 x 15 mm Quantum   Maverick up to 16 and then 18 atmospheres.  There was some plaque   shifting into a diagonal ostium, but there was TIMI III flow in the   diagonal.  The stent was well expanded.  There was a nice step-down off   the distal  aspect of the stent.  There was no residual stenosis.  There   was TIMI III flow.   Comments:  Circumflex was   wired with the same Cougar guidewire without difficulty.  I decided to   primarily stent the lesion with a 2.5 x 18 mm Promus stent.  This was   deployed at 14 atmospheres.  The stent had a residual waste throughout   the midportion.  I post-dilated the stent with a 2.75 x 12 mm Quantum   Maverick which was taken to 18 and then 20 atmospheres on a total of 3   inflations to cover the entire stented segment.  There was no   significant residual stenosis.  The patient tolerated the entire   procedure well.  There were no immediate complications.   ASSESSMENT:  Successful three-vessel percutaneous coronary intervention   using Promus drug-eluting stents.      PLAN:  Ms. [name] should remain on aspirin and Plavix indefinitely.    Impression & Recommendations:  Problem # 1:  DIASTOLIC HEART FAILURE, ACUTE (ICD-428.31) Pt with clinical signs and symptoms of CHF, previously with normal LV function. Plan admit to hospital, check baseline renal function and BNP, diurese with IV furosemide. No beta-blocker secondary to resting HR 50 bpm. Will check to see if she has had adverse reaction to ACE or ARB. Check 2D echo.  Her updated medication list for this problem includes:    Furosemide 80 Mg Tabs (Furosemide) .Marland Kitchen... 1 by mouth qam    Plavix 75 Mg Tabs (Clopidogrel bisulfate) .Marland Kitchen... 1po qd    Ecotrin Low Strength 81 Mg Tbec (Aspirin) .Marland Kitchen... 1po qd    Nitroquick 0.4 Mg Subl (Nitroglycerin) ..... Use asd    Amlodipine Besylate 5 Mg Tabs (Amlodipine besylate) .Marland Kitchen... 1 by mouth once daily  Problem # 2:  CORONARY ARTERY DISEASE (ICD-414.00) Pt with new onset exertional angina over past 2 weeks. She is s/p 3 vessel stenting with DES platforms approximately 3 years ago. With progressive angina at low level activity recommend cardiac cath. Discussed risks and indications with patient who  agrees on hospitalization today and likely cath in AM. Continue ASA anda plavix.  Her updated medication list for this problem includes:    Plavix 75 Mg Tabs (Clopidogrel bisulfate) .Marland Kitchen... 1po qd    Ecotrin Low Strength 81 Mg Tbec (Aspirin) .Marland Kitchen... 1po qd    Nitroquick 0.4 Mg Subl (Nitroglycerin) ..... Use asd    Amlodipine Besylate 5 Mg Tabs (Amlodipine besylate) .Marland Kitchen... 1 by mouth once daily  Orders: EKG w/ Interpretation (93000)  Problem # 3:  HYPERTENSION (ICD-401.9) Controlled. Reivew hx of ACE/ARB....reviewed and diovan was discontinued because of worsening leg edema. Recommend another trial after she undergoes cardiac catheterization.  Her updated medication list for this problem includes:    Furosemide 80 Mg Tabs (Furosemide) .Marland Kitchen... 1 by mouth qam    Ecotrin Low Strength 81 Mg Tbec (Aspirin) .Marland Kitchen... 1po qd    Amlodipine Besylate 5 Mg Tabs (Amlodipine besylate) .Marland Kitchen... 1 by mouth once daily  BP today: 140/72 Prior BP: 126/74 (01/21/2010)  Prior 10 Yr Risk Heart Disease: N/A (09/11/2009)  Labs Reviewed: K+: 4.1 (11/26/2009) Creat: : 1.1 (11/26/2009)   Chol: 152 (09/11/2009)   HDL: 40.00 (09/11/2009)   LDL: 78 (09/02/2008)   TG: 216.0 (09/11/2009)  Patient Instructions: 1)  Admit to [facility]

## 2010-05-13 NOTE — Assessment & Plan Note (Signed)
Summary: EPH/POST CABG   Visit Type:  Post-hospital Primary Provider:  Oliver Barre  CC:  No complaints.  History of Present Illness: This is a 77 year-old woman with CAD s/p multivessel PCI in 2009. She was treated with drug-eluting stents at that time. She then presented with unstable angina and was hospitalized last month. She was found to have recurrent severe multivessel disease and underwent multivessel CABG by Dr Dorris Fetch. She presents today for hospital followup evaluation.  She has been in rehabilitation, but is eager to return home. Her grandaughter will be able to stay with her at night for the time being.  She feels well and is happy with the symptomatic improvement since CABG. She denies chest pain, pressure, or dyspnea with exertion. Her left leg swelling and pain have improved. She is ambulating with a walker and reports no falls or instability.    Current Medications (verified): 1)  Aurora Lancet Super Thin 30g  Misc (Lancets) .... Use 1 Stick As Directed Once  A Day 2)  Furosemide 80 Mg Tabs (Furosemide) .Marland Kitchen.. 1 By Mouth Qam 3)  Onetouch Ultra Test   Strp (Glucose Blood) .... Use Asd 1 Strip Tid 4)  Omeprazole 20 Mg  Cpdr (Omeprazole) .... 2 By Mouth Once Daily 5)  Aspirin Ec 325 Mg Tbec (Aspirin) .... Take One Tablet By Mouth Daily 6)  Nitroquick 0.4 Mg  Subl (Nitroglycerin) .... Use Asd 7)  Pravachol 40 Mg  Tabs (Pravastatin Sodium) .Marland Kitchen.. 1 By Mouth Qd 8)  Potassium Chloride Crys Cr 20 Meq Cr-Tabs (Potassium Chloride Crys Cr) .... Take One Tablet By Mouth Twice A Day 9)  Allopurinol 100 Mg Tabs (Allopurinol) .Marland Kitchen.. 1po Once Daily 10)  Onetouch Ultra Test  Strp (Glucose Blood) .... Two Times A Day, and Lancets 250.00 11)  Oxycodone Hcl 5 Mg Tabs (Oxycodone Hcl) .Marland Kitchen.. 1  - 2  Tabs By Mouth Q 6 Hrs As Needed Pain 12)  Methocarbamol 500 Mg Tabs (Methocarbamol) .Marland Kitchen.. 1 Q6h As Needed Cramps 13)  Fexofenadine Hcl 180 Mg Tabs (Fexofenadine Hcl) .Marland Kitchen.. 1po Once Daily As Needed  Allergies 14)  Humulin N 100 Unit/ml Susp (Insulin Isophane Human) .... 80 Units Am and 30 Units Pm 15)  Amlodipine Besylate 10 Mg Tabs (Amlodipine Besylate) .... Take One Tablet By Mouth Daily 16)  Novolog 100 Unit/ml Soln (Insulin Aspart) .... As Directed 17)  Losartan Potassium 100 Mg Tabs (Losartan Potassium) .... Take 1 Tablet By Mouth Once A Day 18)  Polysaccharide Iron Complex 150 Mg Caps (Polysaccharide Iron Complex) .... Take 1 Capsule By Mouth Once A Day 19)  Synthroid 100 Mcg Tabs (Levothyroxine Sodium) .... Take 1 Tablet By Mouth Once A Day 20)  Vitamin D2 400 Unit Tabs (Ergocalciferol) .... Take 1 Tablet By Mouth Once A Day  Allergies: 1)  ! Sulfa 2)  ! Pcn 3)  ! Metformin Hcl (Metformin Hcl) 4)  ! Cardizem La (Diltiazem Hcl Coated Beads) 5)  ! Lovastatin (Lovastatin) 6)  ! * Shellfish  Past History:  Past medical history reviewed for relevance to current acute and chronic problems.  Past Medical History: Multivessel coronary artery disease s/p PCI with DES 2008 and CABG 2011. Hypertension Diabetes mellitus, type II Peripheral vascular disease Diverticulosis, colon Morbid Obesity Gout Depression Osteoarthritis Osteopenia Hyperlipidemia Low back pain/'lumbar disc dz/lumbar spinal stenosis cervical spine disc dz thrombocytopenia hx of Anxiety GERD Barrett's esophagus gastroparesis hx of h pylori gastritis Coronary artery disease Allergic rhinitis  Review of Systems  Negative except as per HPI   Vital Signs:  Patient profile:   77 year old female Height:      67 inches Weight:      229 pounds BMI:     36.00 Pulse rate:   72 / minute Pulse rhythm:   regular Resp:     18 per minute BP sitting:   124 / 64  (left arm) Cuff size:   large  Vitals Entered By: Vikki Ports (March 23, 2010 9:44 AM)  Physical Exam  General:  Pt is alert and oriented, obese African-American woman in no acute distress. HEENT: normal Neck: normal carotid  upstrokes with bilateral bruits left greater than right, JVP normal Lungs: CTAB CV: RRR without murmur or gallop Abd: soft, obese, NT, positive BS, no bruit, no organomegaly Ext: Mild left pretibial edema and tenderness, redness has diminished since hospital discharge.    EKG  Procedure date:  03/23/2010  Findings:      NSR with rare PAC, 73 bpm, nonspecific T wave abnormality  Impression & Recommendations:  Problem # 1:  CORONARY ARTERY DISEASE (ICD-414.00) Pt doing well s/p CABG. No evidence of CHF or recurrent angina/ischemia. Her left leg is greatly improved. I think she is medically stable to RTN home, but would clearly benefit from [facility] assistance...will make the referral today.  She is on appropriate medical therapy with ASA, statin, and an ARB. She is not a candidate for beta blockers at the present time because of bradycardia in the hospital...she had marked bradycardia and frequent pauses on telemetry after surgery. Fortunately she is having no symptoms attributable to her heart rhythm at present and has a good heart rate today.  The following medications were removed from the medication list:    Plavix 75 Mg Tabs (Clopidogrel bisulfate) .Marland Kitchen... 1po qd Her updated medication list for this problem includes:    Aspirin Ec 325 Mg Tbec (Aspirin) .Marland Kitchen... Take one tablet by mouth daily    Nitroquick 0.4 Mg Subl (Nitroglycerin) ..... Use asd    Amlodipine Besylate 10 Mg Tabs (Amlodipine besylate) .Marland Kitchen... Take one tablet by mouth daily  Problem # 2:  ACUTE ON CHRONIC DIASTOLIC HEART FAILURE (ICD-428.33) Volume status looks good. Recent CXR reviewed and showed no significant effusion or pulmonary edema. Check a BMET today to eval renal fxn and potassium.  The following medications were removed from the medication list:    Plavix 75 Mg Tabs (Clopidogrel bisulfate) .Marland Kitchen... 1po qd Her updated medication list for this problem includes:    Furosemide 80 Mg Tabs (Furosemide) .Marland Kitchen... 1 by  mouth qam    Aspirin Ec 325 Mg Tbec (Aspirin) .Marland Kitchen... Take one tablet by mouth daily    Nitroquick 0.4 Mg Subl (Nitroglycerin) ..... Use asd    Amlodipine Besylate 10 Mg Tabs (Amlodipine besylate) .Marland Kitchen... Take one tablet by mouth daily    Losartan Potassium 100 Mg Tabs (Losartan potassium) .Marland Kitchen... Take 1 tablet by mouth once a day  Other Orders: TLB-BMP (Basic Metabolic Panel-BMET) (80048-METABOL)  Patient Instructions: 1)  Your physician recommends that you schedule a follow-up appointment in: 4 weeks 2)  Your physician recommends that you continue on your current medications as directed. Please refer to the Current Medication list given to you today. 3)  You have been referred to [facility]. 4)  You had blood work drawn today

## 2010-05-13 NOTE — Miscellaneous (Signed)
Summary: Orders Update  Clinical Lists Changes  Orders: Added new Test order of Venous Duplex Lower Extremity (Venous Duplex Lower) - Signed 

## 2010-05-13 NOTE — Assessment & Plan Note (Signed)
Summary: 1 MTH FU---STC   Vital Signs:  Patient profile:   77 year old female Height:      67 inches (170.18 cm) Weight:      236 pounds (107.27 kg) BMI:     37.10 O2 Sat:      97 % on Room air Temp:     97.6 degrees F (36.44 degrees C) oral Pulse rate:   56 / minute BP sitting:   126 / 74  (left arm) Cuff size:   large  Vitals Entered By: Brenton Grills MA (January 21, 2010 1:43 PM)  O2 Flow:  Room air CC: 1 month F/U/aj Is Patient Diabetic? Yes   Primary Provider:  Oliver Barre  CC:  1 month F/U/aj.  History of Present Illness: pt takes nph insulin 50 units am and 35 units pm.  no cbg record, but states cbg's are well-controlled.  pt states she feels well in general.  she says her ability to buy medications is severely limited.   Current Medications (verified): 1)  Aurora Lancet Super Thin 30g  Misc (Lancets) .... Use 1 Stick As Directed Once  A Day 2)  Indomethacin 50 Mg Caps (Indomethacin) .Marland Kitchen.. 1 By Mouth Three Times A Day As Needed Gout 3)  Diclofenac Sodium 75 Mg Tbec (Diclofenac Sodium) .... Take 1 Tablet By Mouth Twice A Day 4)  Furosemide 80 Mg Tabs (Furosemide) .Marland Kitchen.. 1 By Mouth Qam 5)  Onetouch Ultra Test   Strp (Glucose Blood) .... Use Asd 1 Strip Tid 6)  Omeprazole 20 Mg  Cpdr (Omeprazole) .... 2 By Mouth Once Daily 7)  Plavix 75 Mg  Tabs (Clopidogrel Bisulfate) .Marland Kitchen.. 1po Qd 8)  Ecotrin Low Strength 81 Mg  Tbec (Aspirin) .Marland Kitchen.. 1po Qd 9)  Nitroquick 0.4 Mg  Subl (Nitroglycerin) .... Use Asd 10)  Pravachol 40 Mg  Tabs (Pravastatin Sodium) .Marland Kitchen.. 1 By Mouth Qd 11)  Potassium Chloride Crys Cr 20 Meq Cr-Tabs (Potassium Chloride Crys Cr) .... Take One Tablet By Mouth Twice A Day 12)  Allopurinol 100 Mg Tabs (Allopurinol) .Marland Kitchen.. 1po Once Daily 13)  Onetouch Ultra Test  Strp (Glucose Blood) .... Two Times A Day, and Lancets 250.00 14)  Relion Insulin Syringe 31g X 5/16" 0.5 Ml Misc (Insulin Syringe-Needle U-100) .... Bid 15)  Oxycodone Hcl 5 Mg Tabs (Oxycodone Hcl) .Marland Kitchen.. 1  - 2   Tabs By Mouth Q 6 Hrs As Needed Pain 16)  Methocarbamol 500 Mg Tabs (Methocarbamol) .Marland Kitchen.. 1 Q6h As Needed Cramps 17)  Fexofenadine Hcl 180 Mg Tabs (Fexofenadine Hcl) .Marland Kitchen.. 1po Once Daily As Needed Allergies 18)  Humulin N 100 Unit/ml Susp (Insulin Isophane Human) .... 80 Units Am and 30 Units Pm 19)  Amlodipine Besylate 5 Mg Tabs (Amlodipine Besylate) .Marland Kitchen.. 1 By Mouth Once Daily  Allergies (verified): 1)  ! Sulfa 2)  ! Pcn 3)  ! Metformin Hcl (Metformin Hcl) 4)  ! Cardizem La (Diltiazem Hcl Coated Beads) 5)  ! Lovastatin (Lovastatin) 6)  ! * Shellfish  Past History:  Past Medical History: Last updated: 11/20/2009 Multivessel coronary artery disease Hypertension Diabetes mellitus, type II Peripheral vascular disease Diverticulosis, colon Morbid Obesity Gout Depression Osteoarthritis Osteopenia Hyperlipidemia Low back pain/'lumbar disc dz/lumbar spinal stenosis cervical spine disc dz thrombocytopenia hx of Anxiety GERD Barrett's esophagus gastroparesis hx of h pylori gastritis Coronary artery disease Allergic rhinitis  Review of Systems  The patient denies hypoglycemia.    Physical Exam  General:  obese.  no distress  Skin:  insulin  injection sites at anterior abdomen are normal    Impression & Recommendations:  Problem # 1:  DIABETES MELLITUS, TYPE II (ICD-250.00) needs increased rx  Medications Added to Medication List This Visit: 1)  Amlodipine Besylate 5 Mg Tabs (Amlodipine besylate) .Marland Kitchen.. 1 by mouth once daily  Other Orders: Admin 1st Vaccine (16109) Flu Vaccine 17yrs + (60454) Est. Patient Level III (09811)  Patient Instructions: 1)  check your blood sugar 2 times a day.  vary the time of day when you check, between before the 3 meals, and at bedtime.  also check if you have symptoms of your blood sugar being too high or too low.  please keep a record of the readings and bring it to your next appointment here.  please call us sooner if you are having  low blood sugar episodes. 2)  blood tests are being ordered for you today.  please call [phone] to hear your test results. 3)  return 3 months. 4)  increase insulin to 80 units am and 30 units pm.  here are some samples of "levemir" (a different, but somewhat similar insulin), to save you some money.   Flu Vaccine Consent Questions     Do you have a history of severe allergic reactions to this vaccine? no    Any prior history of allergic reactions to egg and/or gelatin? no    Do you have a sensitivity to the preservative Thimersol? no    Do you have a past history of Guillan-Barre Syndrome? no    Do you currently have an acute febrile illness? no    Have you ever had a severe reaction to latex? no    Vaccine information given and explained to patient? yes    Are you currently pregnant? no    Lot Number:[id number]   Exp Date:10/09/2010   Site Given  Left Deltoid [id number]

## 2010-05-13 NOTE — Progress Notes (Signed)
  Phone Note Refill Request Message from:  Fax from Pharmacy on November 04, 2009 9:55 AM  Refills Requested: Medication #1:  FUROSEMIDE 80 MG TABS 1 by mouth qam   Dosage confirmed as above?Dosage Confirmed   Notes: [facility]  Medication #2:  KLOR-CON 20 MEQ PACK Take 1 tablet by mouth two times a day   Dosage confirmed as above?Dosage Confirmed   Notes: [facility] Initial call taken by: Robin Ewing CMA Duncan Dull),  November 04, 2009 9:55 AM    Prescriptions: KLOR-CON 20 MEQ PACK (POTASSIUM CHLORIDE) Take 1 tablet by mouth two times a day  #180 x 3   Entered by:   Scharlene Gloss CMA (AAMA)   Authorized by:   Corwin Levins MD   Signed by:   Scharlene Gloss CMA (AAMA) on 11/04/2009   Method used:   Faxed to ...       [facility] ([facility]order)       [address]       [place], [place]         Ph: [phone]       Fax: [phone]   RxID:   [id number] FUROSEMIDE 80 MG TABS (FUROSEMIDE) 1 by mouth qam  #90 x 3   Entered by:   Scharlene Gloss CMA (AAMA)   Authorized by:   Corwin Levins MD   Signed by:   Scharlene Gloss CMA (AAMA) on 11/04/2009   Method used:   Faxed to ...       [facility] (mail-order)       [address]       [place], [place]         Ph: [phone]       Fax: [phone]   RxID:   [id number]

## 2010-05-13 NOTE — Assessment & Plan Note (Signed)
Summary: LEG PAIN/ JUST GOT OUT OF HOSP/ [facility]   Vital Signs:  Patient profile:   77 year old female Height:      67 inches Weight:      229.50 pounds BMI:     36.07 O2 Sat:      99 % on Room air Temp:     97.3 degrees F oral Pulse rate:   68 / minute BP sitting:   132 / 60  (left arm) Cuff size:   large  Vitals Entered By: Zella Ball Ewing CMA Duncan Dull) (April 02, 2010 10:52 AM)  O2 Flow:  Room air CC: Leg Pain/RE   Primary Care Provider:  Oliver Barre  CC:  Leg Pain/RE.  History of Present Illness: here to f/u;  with c/o LLE pain , itching and swelling below the left knee that is not clear is actually worse;  pt s/p nov 10 CABG with LLE saphenous vein harvesting , post op course complicated by anemia (nadir hgb at d/c 8.8) without overt bleeding or bruising post op, as well as LLE cellulitis, and uncontrolled sugars.  Since d/c she completed rehab inpt, and now home for several days.  Here today with c/o LLE achy discomfort with tenderness, swelling and itching from below the knee and distal, this time without erythema, ulcer or drainage (and no fever, weakness or malaise), but still calf is "tight" but admits it may not be much different than when she left the hospital.. She is somewhat confused today and seems unaware she was treated for cellulitis post op.  Overall alert and oriented though, walks with walker without recent fall or worsening balance issue.  Pt denies CP, worsening sob, doe, wheezing, orthopnea, pnd, palps, dizziness or syncope  Pt denies new neuro symptoms such as headache, facial or extremity weakness  Pt denies polydipsia, polyuria, or low sugar symptoms such as shakiness improved with eating.  Overall good compliance with meds, trying to follow low chol, DM diet, wt stable, though it seems she was discharged on qid novolog sliding scale with 22 bid Novolin N, which per pt was cont'd at rehab, but since at home she had no prescription for Novolog,  went back to her Novolin N,  though at lower strength than previously - currently taking 45 AM, 30 PM (has been on 80 AM and 30 PM) with cbg's currently in the 100's except for a 300 last night after she ate pound cake.   Also did complete her post op cipro and flangyl without problem.   Problems Prior to Update: 1)  Leg Pain  (ICD-729.5) 2)  Anemia-nos  (ICD-285.9) 3)  Diastolic Heart Failure, Acute  (ICD-428.31) 4)  Acute On Chronic Diastolic Heart Failure  (ICD-428.33) 5)  Sinus Bradycardia  (ICD-427.81) 6)  Back Pain  (ICD-724.5) 7)  Allergic Rhinitis  (ICD-477.9) 8)  Muscle Strain, Right Buttock  (ICD-848.8) 9)  Shingles  (ICD-053.9) 10)  Shoulder Pain, Left  (ICD-719.41) 11)  Proteinuria  (ICD-791.0) 12)  Preventive Health Care  (ICD-V70.0) 13)  Abdominal Pain, Unspecified Site  (ICD-789.00) 14)  Finger Pain  (ICD-729.5) 15)  Coronary Artery Disease  (ICD-414.00) 16)  Chest Pain  (ICD-786.50) 17)  Dizziness  (ICD-780.4) 18)  Peripheral Edema  (ICD-782.3) 19)  Family History of Cad Female 1st Degree Relative <50  (ICD-V17.3) 20)  Spinal Stenosis, Lumbar  (ICD-724.02) 21)  Disc Disease, Cervical  (ICD-722.4) 22)  Helicobacter Pylori Gastritis, Hx of  (ICD-V12.79) 23)  Gastroparesis  (ICD-536.3) 24)  Barrett's Esophagus,  Hx of  (ICD-V12.79) 25)  Gerd  (ICD-530.81) 26)  Disc Disease, Lumbar  (ICD-722.52) 27)  Anxiety  (ICD-300.00) 28)  Low Back Pain  (ICD-724.2) 29)  Hyperlipidemia  (ICD-272.4) 30)  Osteopenia  (ICD-733.90) 31)  Osteoarthritis  (ICD-715.90) 32)  Depression  (ICD-311) 33)  Gout  (ICD-274.9) 34)  Morbid Obesity  (ICD-278.01) 35)  Diverticulosis, Colon  (ICD-562.10) 36)  Peripheral Vascular Disease  (ICD-443.9) 37)  Diabetes Mellitus, Type II  (ICD-250.00) 38)  Hypertension  (ICD-401.9)  Medications Prior to Update: 1)  Aurora Lancet Super Thin 30g  Misc (Lancets) .... Use 1 Stick As Directed Once  A Day 2)  Furosemide 80 Mg Tabs (Furosemide) .Marland Kitchen.. 1 By Mouth Qam 3)  Onetouch Ultra Test    Strp (Glucose Blood) .... Use Asd 1 Strip Tid 4)  Omeprazole 20 Mg  Cpdr (Omeprazole) .... 2 By Mouth Once Daily 5)  Aspirin Ec 325 Mg Tbec (Aspirin) .... Take One Tablet By Mouth Daily 6)  Nitroquick 0.4 Mg  Subl (Nitroglycerin) .... Use Asd 7)  Pravachol 40 Mg  Tabs (Pravastatin Sodium) .Marland Kitchen.. 1 By Mouth Qd 8)  Potassium Chloride Crys Cr 20 Meq Cr-Tabs (Potassium Chloride Crys Cr) .... Take One Tablet By Mouth Twice A Day 9)  Allopurinol 100 Mg Tabs (Allopurinol) .Marland Kitchen.. 1po Once Daily 10)  Onetouch Ultra Test  Strp (Glucose Blood) .... Two Times A Day, and Lancets 250.00 11)  Oxycodone Hcl 5 Mg Tabs (Oxycodone Hcl) .Marland Kitchen.. 1  - 2  Tabs By Mouth Q 6 Hrs As Needed Pain 12)  Methocarbamol 500 Mg Tabs (Methocarbamol) .Marland Kitchen.. 1 Q6h As Needed Cramps 13)  Fexofenadine Hcl 180 Mg Tabs (Fexofenadine Hcl) .Marland Kitchen.. 1po Once Daily As Needed Allergies 14)  Humulin N 100 Unit/ml Susp (Insulin Isophane Human) .... 80 Units Am and 30 Units Pm 15)  Amlodipine Besylate 10 Mg Tabs (Amlodipine Besylate) .... Take One Tablet By Mouth Daily 16)  Novolog 100 Unit/ml Soln (Insulin Aspart) .... As Directed 17)  Losartan Potassium 100 Mg Tabs (Losartan Potassium) .... Take 1 Tablet By Mouth Once A Day 18)  Polysaccharide Iron Complex 150 Mg Caps (Polysaccharide Iron Complex) .... Take 1 Capsule By Mouth Once A Day 19)  Synthroid 100 Mcg Tabs (Levothyroxine Sodium) .... Take 1 Tablet By Mouth Once A Day 20)  Vitamin D2 400 Unit Tabs (Ergocalciferol) .... Take 1 Tablet By Mouth Once A Day  Current Medications (verified): 1)  Aurora Lancet Super Thin 30g  Misc (Lancets) .... Use 1 Stick As Directed Once  A Day 2)  Furosemide 80 Mg Tabs (Furosemide) .Marland Kitchen.. 1 By Mouth Qam 3)  Onetouch Ultra Test   Strp (Glucose Blood) .... Use Asd 1 Strip Tid 4)  Omeprazole 20 Mg  Cpdr (Omeprazole) .... 2 By Mouth Once Daily 5)  Aspirin Ec 325 Mg Tbec (Aspirin) .... Take One Tablet By Mouth Daily 6)  Nitroquick 0.4 Mg  Subl (Nitroglycerin) .... Use  Asd 7)  Pravachol 40 Mg  Tabs (Pravastatin Sodium) .Marland Kitchen.. 1 By Mouth Qd 8)  Potassium Chloride Crys Cr 20 Meq Cr-Tabs (Potassium Chloride Crys Cr) .... Take One Tablet By Mouth Twice A Day 9)  Allopurinol 100 Mg Tabs (Allopurinol) .Marland Kitchen.. 1po Once Daily 10)  Onetouch Ultra Test  Strp (Glucose Blood) .... Two Times A Day, and Lancets 250.00 11)  Oxycodone Hcl 5 Mg Tabs (Oxycodone Hcl) .Marland Kitchen.. 1  - 2  Tabs By Mouth Q 6 Hrs As Needed Pain 12)  Methocarbamol 500 Mg Tabs (  Methocarbamol) .Marland Kitchen.. 1 Q6h As Needed Cramps 13)  Fexofenadine Hcl 180 Mg Tabs (Fexofenadine Hcl) .Marland Kitchen.. 1po Once Daily As Needed Allergies 14)  Humulin N 100 Unit/ml Susp (Insulin Isophane Human) .... 45 Units Am and 30 Units Pm 15)  Amlodipine Besylate 10 Mg Tabs (Amlodipine Besylate) .... Take One Tablet By Mouth Daily 16)  Losartan Potassium 100 Mg Tabs (Losartan Potassium) .... Take 1 Tablet By Mouth Once A Day 17)  Polysaccharide Iron Complex 150 Mg Caps (Polysaccharide Iron Complex) .... Take 1 Capsule By Mouth Once A Day 18)  Synthroid 100 Mcg Tabs (Levothyroxine Sodium) .... Take 1 Tablet By Mouth Once A Day 19)  Vitamin D2 400 Unit Tabs (Ergocalciferol) .... Take 1 Tablet By Mouth Once A Day  Allergies (verified): 1)  ! Sulfa 2)  ! Pcn 3)  ! Metformin Hcl (Metformin Hcl) 4)  ! Cardizem La (Diltiazem Hcl Coated Beads) 5)  ! Lovastatin (Lovastatin) 6)  ! * Shellfish  Past History:  Past Surgical History: Last updated: 11/20/2009  drug-eluting stent to the left anterior descending,    circumflex and right coronary artery in January 2009.  Cholecystectomy Hysterectomy Tubal ligation Ovarian Cystectomies s/p stent x 3 - Dr Excell Seltzer  Social History: Last updated: 11/20/2009 Former Smoker Alcohol use-no retired widowed 2004 Drug use-no  Risk Factors: Smoking Status: quit (02/15/2007)  Past Medical History: Multivessel coronary artery disease s/p PCI with DES 2008 and CABG 2011. Hypertension Diabetes mellitus, type  II Peripheral vascular disease Diverticulosis, colon Morbid Obesity Gout Depression Osteoarthritis Osteopenia Hyperlipidemia Low back pain/'lumbar disc dz/lumbar spinal stenosis cervical spine disc dz thrombocytopenia hx of Anxiety GERD Barrett's esophagus gastroparesis hx of h pylori gastritis Coronary artery disease Allergic rhinitis Anemia-NOS  Review of Systems       all otherwise negative per pt -    Physical Exam  General:  alert and overweight-appearing.   Head:  normocephalic and atraumatic.   Eyes:  vision grossly intact, pupils equal, and pupils round.   Ears:  R ear normal and L ear normal.   Nose:  no external deformity and no nasal discharge.   Mouth:  no gingival abnormalities and pharynx pink and moist.   Neck:  supple and no masses.   Lungs:  normal respiratory effort and normal breath sounds.   Heart:  normal rate and regular rhythm.   Abdomen:  soft, non-tender, and normal bowel sounds.   Msk:  no acute joint tenderness and no joint swelling, including the bilat knees Pulses:  1+ bilat dorsalis pedis Extremities:  RLE with trace -1+ edema only; LLE with 1-2+ edema to just below the knee, somewhat more "tense" with mild tender left calf, equiv Homans sign, and evidence for new stasis dermatitis diffusely, but saph vein harvest site NT and no swelling/erythema and no other sign of celluitis as well, no ulcer, no abscess Neurologic:  strength normal in all extremities and gait relatively normal walking with walker Skin:  as above - c/w stasis dermatitis Psych:  not depressed appearing and slightly anxious.     Impression & Recommendations:  Problem # 1:  LEG PAIN (ICD-729.5) Assessment New with sweling, with mult elements likely in play, including venous insuffiency, s/p saph vein harvest recent;  severe anemia, Diast CHF, or even DVT  -   will ask for LE venous doppler, cont same meds for now, includng same diuretic  Orders: Radiology Referral  (Radiology)  Problem # 2:  ANEMIA-NOS (ICD-285.9) Assessment: New  Her updated medication list  for this problem includes:    Polysaccharide Iron Complex 150 Mg Caps (Polysaccharide iron complex) .Marland Kitchen... Take 1 capsule by mouth once a day  Orders: TLB-CBC Platelet - w/Differential (85025-CBCD) stable overall by hx and exam, ok to continue meds/tx as is , no overt bleeding or bruising o/w post op  Problem # 3:  DIASTOLIC HEART FAILURE, ACUTE (ICD-428.31) Assessment: Unchanged  Her updated medication list for this problem includes:    Furosemide 80 Mg Tabs (Furosemide) .Marland Kitchen... 1 by mouth qam    Aspirin Ec 325 Mg Tbec (Aspirin) .Marland Kitchen... Take one tablet by mouth daily    Losartan Potassium 100 Mg Tabs (Losartan potassium) .Marland Kitchen... Take 1 tablet by mouth once a day givent the RLE status and other I exam, c/w overall stable exam - Continue all previous medications as before this visit   Problem # 4:  DIABETES MELLITUS, TYPE II (ICD-250.00) Assessment: Unchanged  The following medications were removed from the medication list:    Novolog 100 Unit/ml Soln (Insulin aspart) .Marland Kitchen... As directed Her updated medication list for this problem includes:    Aspirin Ec 325 Mg Tbec (Aspirin) .Marland Kitchen... Take one tablet by mouth daily    Humulin N 100 Unit/ml Susp (Insulin isophane human) .Marland KitchenMarland KitchenMarland KitchenMarland Kitchen 45 units am and 30 units pm    Losartan Potassium 100 Mg Tabs (Losartan potassium) .Marland Kitchen... Take 1 tablet by mouth once a day  Orders: TLB-Lipid Panel (80061-LIPID) TLB-BMP (Basic Metabolic Panel-BMET) (80048-METABOL) TLB-A1C / Hgb A1C (Glycohemoglobin) (83036-A1C)  Complete Medication List: 1)  Aurora Lancet Super Thin 30g Misc (Lancets) .... Use 1 stick as directed once  a day 2)  Furosemide 80 Mg Tabs (Furosemide) .Marland Kitchen.. 1 by mouth qam 3)  Onetouch Ultra Test Strp (Glucose blood) .... Use asd 1 strip tid 4)  Omeprazole 20 Mg Cpdr (Omeprazole) .... 2 by mouth once daily 5)  Aspirin Ec 325 Mg Tbec (Aspirin) .... Take one tablet  by mouth daily 6)  Nitroquick 0.4 Mg Subl (Nitroglycerin) .... Use asd 7)  Pravachol 40 Mg Tabs (Pravastatin sodium) .Marland Kitchen.. 1 by mouth qd 8)  Potassium Chloride Crys Cr 20 Meq Cr-tabs (Potassium chloride crys cr) .... Take one tablet by mouth twice a day 9)  Allopurinol 100 Mg Tabs (Allopurinol) .Marland Kitchen.. 1po once daily 10)  Onetouch Ultra Test Strp (Glucose blood) .... Two times a day, and lancets 250.00 11)  Oxycodone Hcl 5 Mg Tabs (Oxycodone hcl) .Marland Kitchen.. 1  - 2  tabs by mouth q 6 hrs as needed pain 12)  Methocarbamol 500 Mg Tabs (Methocarbamol) .Marland Kitchen.. 1 q6h as needed cramps 13)  Fexofenadine Hcl 180 Mg Tabs (Fexofenadine hcl) .Marland Kitchen.. 1po once daily as needed allergies 14)  Humulin N 100 Unit/ml Susp (Insulin isophane human) .... 45 units am and 30 units pm 15)  Amlodipine Besylate 10 Mg Tabs (Amlodipine besylate) .... Take one tablet by mouth daily 16)  Losartan Potassium 100 Mg Tabs (Losartan potassium) .... Take 1 tablet by mouth once a day 17)  Polysaccharide Iron Complex 150 Mg Caps (Polysaccharide iron complex) .... Take 1 capsule by mouth once a day 18)  Synthroid 100 Mcg Tabs (Levothyroxine sodium) .... Take 1 tablet by mouth once a day 19)  Vitamin D2 400 Unit Tabs (Ergocalciferol) .... Take 1 tablet by mouth once a day  Patient Instructions: 1)  You will be contacted about the referral(s) to: leg doppler test to make sure no blood clot in the leg 2)  Please go to the Lab in the basement  for your blood and/or urine tests today 3)  Please call the number on the [facility] Card for results of your testing  4)  Continue all previous medications as before this visit , including the Novolin N at 45 in the AM, and 30 in the PM (and call for blood sugars > 200 consistently if you are not able to get it down) 5)  Please make appt to also f/u with Dr Everardo All after the first of the year   Orders Added: 1)  TLB-CBC Platelet - w/Differential [85025-CBCD] 2)  TLB-Lipid Panel [80061-LIPID] 3)  TLB-BMP (Basic  Metabolic Panel-BMET) [80048-METABOL] 4)  TLB-A1C / Hgb A1C (Glycohemoglobin) [83036-A1C] 5)  Radiology Referral [Radiology] 6)  Est. Patient Level IV [16109]

## 2010-05-13 NOTE — Assessment & Plan Note (Signed)
Summary: CPX-LB   Vital Signs:  Patient profile:   77 year old female Height:      67 inches Weight:      239.13 pounds BMI:     37.59 O2 Sat:      98 % on Room air Temp:     97.6 degrees F oral Pulse rate:   55 / minute BP sitting:   146 / 90  (left arm) Cuff size:   large  Vitals Entered ByMarland Kitchen Zella Ball Ewing (September 11, 2009 8:31 AM)  O2 Flow:  Room air  CC: Adult Physical/RE, Hypertension Management   Primary Care Provider:  Oliver Barre  CC:  Adult Physical/RE and Hypertension Management.  History of Present Illness: walks with cane, c/o mild worsening LE swelling right more then left, mild, Pt denies CP, sob, doe, wheezing, orthopnea, pnd, palps, dizziness or syncope.  Admits to drinking more fluids during the summer, maybe more salt in the diet.  and BP mild elevated today.   also wtih new x 1 wk mild to mod pain to the right buttock "on the part I sit on" wihtout more siting recently, and no ulcers or redness;  hurts constant, but worse to sit on it directly;  No lumbar area pain, no recent falls, and does have some crampiness pains occas to the right upper post thigh as well.    was not able to get lab work done prior to visit today  also with significant nasal allergy symtpoms without ST, cough, wheezing  Preventive Screening-Counseling & Management      Drug Use:  no.    Problems Prior to Update: 1)  Back Pain  (ICD-724.5) 2)  Allergic Rhinitis  (ICD-477.9) 3)  Muscle Strain, Right Buttock  (ICD-848.8) 4)  Shingles  (ICD-053.9) 5)  Shoulder Pain, Left  (ICD-719.41) 6)  Proteinuria  (ICD-791.0) 7)  Preventive Health Care  (ICD-V70.0) 8)  Abdominal Pain, Unspecified Site  (ICD-789.00) 9)  Finger Pain  (ICD-729.5) 10)  Coronary Artery Disease  (ICD-414.00) 11)  Chest Pain  (ICD-786.50) 12)  Dizziness  (ICD-780.4) 13)  Peripheral Edema  (ICD-782.3) 14)  Family History of Cad Female 1st Degree Relative <50  (ICD-V17.3) 15)  Spinal Stenosis, Lumbar  (ICD-724.02) 16)   Disc Disease, Cervical  (ICD-722.4) 17)  Helicobacter Pylori Gastritis, Hx of  (ICD-V12.79) 18)  Gastroparesis  (ICD-536.3) 19)  Barrett's Esophagus, Hx of  (ICD-V12.79) 20)  Gerd  (ICD-530.81) 21)  Disc Disease, Lumbar  (ICD-722.52) 22)  Anxiety  (ICD-300.00) 23)  Low Back Pain  (ICD-724.2) 24)  Hyperlipidemia  (ICD-272.4) 25)  Osteopenia  (ICD-733.90) 26)  Osteoarthritis  (ICD-715.90) 27)  Depression  (ICD-311) 28)  Gout  (ICD-274.9) 29)  Morbid Obesity  (ICD-278.01) 30)  Diverticulosis, Colon  (ICD-562.10) 31)  Peripheral Vascular Disease  (ICD-443.9) 32)  Diabetes Mellitus, Type II  (ICD-250.00) 33)  Hypertension  (ICD-401.9)  Medications Prior to Update: 1)  Aurora Lancet Super Thin 30g  Misc (Lancets) .... Use 1 Stick As Directed Once  A Day 2)  Indomethacin 50 Mg Caps (Indomethacin) .Marland Kitchen.. 1 By Mouth Three Times A Day As Needed Gout 3)  Diclofenac Sodium 75 Mg Tbec (Diclofenac Sodium) .... Take 1 Tablet By Mouth Twice A Day 4)  Furosemide 80 Mg Tabs (Furosemide) .Marland Kitchen.. 1 By Mouth Qd 5)  Onetouch Ultra Test   Strp (Glucose Blood) .... Use Asd 1 Strip Tid 6)  Omeprazole 20 Mg  Cpdr (Omeprazole) .... 2 By Mouth Once Daily 7)  Plavix 75 Mg  Tabs (Clopidogrel Bisulfate) .Marland Kitchen.. 1po Qd 8)  Ecotrin Low Strength 81 Mg  Tbec (Aspirin) .Marland Kitchen.. 1po Qd 9)  Nitroquick 0.4 Mg  Subl (Nitroglycerin) .... Use Asd 10)  Pravachol 40 Mg  Tabs (Pravastatin Sodium) .Marland Kitchen.. 1 By Mouth Qd 11)  Klor-Con 20 Meq Pack (Potassium Chloride) .... Take 1 Tablet By Mouth Two Times A Day 12)  Allopurinol 100 Mg Tabs (Allopurinol) .Marland Kitchen.. 1po Once Daily 13)  Onetouch Ultra Test  Strp (Glucose Blood) .... Two Times A Day, and Lancets 250.00 14)  Novolin 70/30 70-30 % Susp (Insulin Isophane & Regular) .... 50 Units Qam and 35 Units Qpm 15)  Relion Insulin Syringe 31g X 5/16" 0.5 Ml Misc (Insulin Syringe-Needle U-100) .... Bid 16)  Amlodipine Besylate 10 Mg Tabs (Amlodipine Besylate) .Marland Kitchen.. 1po Once Daily 17)  Oxycodone Hcl 5 Mg  Tabs (Oxycodone Hcl) .Marland Kitchen.. 1  -  3 Tabs By Mouth Q 6 Hrs As Needed Pain 18)  Losartan Potassium 100 Mg Tabs (Losartan Potassium) .Marland Kitchen.. 1po Once Daily 19)  Methocarbamol 500 Mg Tabs (Methocarbamol) .Marland Kitchen.. 1 Q6h As Needed Cramps  Current Medications (verified): 1)  Aurora Lancet Super Thin 30g  Misc (Lancets) .... Use 1 Stick As Directed Once  A Day 2)  Indomethacin 50 Mg Caps (Indomethacin) .Marland Kitchen.. 1 By Mouth Three Times A Day As Needed Gout 3)  Diclofenac Sodium 75 Mg Tbec (Diclofenac Sodium) .... Take 1 Tablet By Mouth Twice A Day 4)  Furosemide 80 Mg Tabs (Furosemide) .Marland Kitchen.. 1 By Mouth Qd 5)  Onetouch Ultra Test   Strp (Glucose Blood) .... Use Asd 1 Strip Tid 6)  Omeprazole 20 Mg  Cpdr (Omeprazole) .... 2 By Mouth Once Daily 7)  Plavix 75 Mg  Tabs (Clopidogrel Bisulfate) .Marland Kitchen.. 1po Qd 8)  Ecotrin Low Strength 81 Mg  Tbec (Aspirin) .Marland Kitchen.. 1po Qd 9)  Nitroquick 0.4 Mg  Subl (Nitroglycerin) .... Use Asd 10)  Pravachol 40 Mg  Tabs (Pravastatin Sodium) .Marland Kitchen.. 1 By Mouth Qd 11)  Klor-Con 20 Meq Pack (Potassium Chloride) .... Take 1 Tablet By Mouth Two Times A Day 12)  Allopurinol 100 Mg Tabs (Allopurinol) .Marland Kitchen.. 1po Once Daily 13)  Onetouch Ultra Test  Strp (Glucose Blood) .... Two Times A Day, and Lancets 250.00 14)  Novolin 70/30 70-30 % Susp (Insulin Isophane & Regular) .... 50 Units Qam and 35 Units Qpm 15)  Relion Insulin Syringe 31g X 5/16" 0.5 Ml Misc (Insulin Syringe-Needle U-100) .... Bid 16)  Labetalol Hcl 200 Mg Tabs (Labetalol Hcl) .Marland Kitchen.. 1 By Mouth Two Times A Day 17)  Oxycodone Hcl 5 Mg Tabs (Oxycodone Hcl) .Marland Kitchen.. 1  - 2  Tabs By Mouth Q 6 Hrs As Needed Pain 18)  Losartan Potassium 100 Mg Tabs (Losartan Potassium) .Marland Kitchen.. 1po Once Daily 19)  Methocarbamol 500 Mg Tabs (Methocarbamol) .Marland Kitchen.. 1 Q6h As Needed Cramps 20)  Fexofenadine Hcl 180 Mg Tabs (Fexofenadine Hcl) .Marland Kitchen.. 1po Once Daily As Needed Allergies  Allergies (verified): 1)  ! Sulfa 2)  ! Pcn 3)  ! Metformin Hcl (Metformin Hcl) 4)  ! Cardizem La  (Diltiazem Hcl Coated Beads) 5)  ! Lovastatin (Lovastatin) 6)  ! * Shellfish  Past History:  Past Surgical History: Last updated: 05/03/2007 Cholecystectomy Hysterectomy Tubal ligation Ovarian Cystectomies s/p stent x 3  Family History: Last updated: 02/15/2007 mother  - dm, heart dz Family History of CAD Female 1st degree relative - 75 yo  Social History: Last updated: 09/11/2009 Former Smoker Alcohol use-no  retired widowed 2004 Drug use-no  Risk Factors: Smoking Status: quit (02/15/2007)  Past Medical History: Hypertension Diabetes mellitus, type II Peripheral vascular disease Diverticulosis, colon Morbid Obesity Gout Depression Osteoarthritis Osteopenia Hyperlipidemia Low back pain/'lumbar disc dz/lumbar spinal stenosis cervical spine disc dz Anxiety GERD Barrett's esophagus gastroparesis hx of h pylori gastritis Coronary artery disease Allergic rhinitis  Social History: Reviewed history from 12/28/2007 and no changes required. Former Smoker Alcohol use-no retired widowed 2004 Drug use-no Drug Use:  no  Review of Systems  The patient denies anorexia, fever, weight loss, vision loss, decreased hearing, hoarseness, chest pain, syncope, dyspnea on exertion, prolonged cough, headaches, hemoptysis, abdominal pain, melena, hematochezia, severe indigestion/heartburn, hematuria, muscle weakness, suspicious skin lesions, transient blindness, depression, unusual weight change, abnormal bleeding, enlarged lymph nodes, angioedema, and breast masses.         all otherwise negative per pt -    Physical Exam  General:  alert and overweight-appearing.   Head:  normocephalic and atraumatic.   Eyes:  vision grossly intact, pupils equal, and pupils round.   Ears:  R ear normal and L ear normal.   Nose:  no external deformity and no nasal discharge.   Mouth:  no gingival abnormalities and pharynx pink and moist.   Neck:  supple and no masses.   Lungs:  normal  respiratory effort and normal breath sounds.   Heart:  normal rate and regular rhythm.   Abdomen:  soft, non-tender, and normal bowel sounds.   Msk:  mild tender post right buttock without rash, swelling, erythema, ulcer; somewhat tender right prox post thigh as well  Extremities:  trace edema bilat, no erythema Neurologic:  cranial nerves II-XII intact and strength normal in all extremities - no gross abnormality Skin:  color normal and no rashes.   Psych:  not anxious appearing and not depressed appearing.     Impression & Recommendations:  Problem # 1:  Preventive Health Care (ICD-V70.0)  Overall doing well, age appropriate education and counseling updated and referral for appropriate preventive services done unless declined, immunizations up to date or declined, diet counseling done if overweight, urged to quit smoking if smokes , most recent labs reviewed and current ordered if appropriate, ecg reviewed or declined (interpretation per ECG scanned in the EMR if done); information regarding Medicare Prevention requirements given if appropriate; speciality referrals updated as appropriate   Orders: T-Vitamin D (25-Hydroxy) (91478-29562) EKG w/ Interpretation (93000) TLB-BMP (Basic Metabolic Panel-BMET) (80048-METABOL) TLB-CBC Platelet - w/Differential (85025-CBCD) TLB-Hepatic/Liver Function Pnl (80076-HEPATIC) TLB-Lipid Panel (80061-LIPID) TLB-TSH (Thyroid Stimulating Hormone) (84443-TSH) TLB-Udip ONLY (81003-UDIP)  Problem # 2:  HYPERTENSION (ICD-401.9)  Her updated medication list for this problem includes:    Furosemide 80 Mg Tabs (Furosemide) .Marland Kitchen... 1 by mouth qd    Labetalol Hcl 200 Mg Tabs (Labetalol hcl) .Marland Kitchen... 1 by mouth two times a day    Losartan Potassium 100 Mg Tabs (Losartan potassium) .Marland Kitchen... 1po once daily ok to change the amllodipine to labetolol 200 two times a day , hopefully to help reduce LE edema and better BP control. Continue all other previous medications as  before this visit   Problem # 3:  DIABETES MELLITUS, TYPE II (ICD-250.00)  Her updated medication list for this problem includes:    Ecotrin Low Strength 81 Mg Tbec (Aspirin) .Marland Kitchen... 1po qd    Novolin 70/30 70-30 % Susp (Insulin isophane & regular) .Marland KitchenMarland KitchenMarland KitchenMarland Kitchen 50 units qam and 35 units qpm    Losartan Potassium 100 Mg Tabs (Losartan potassium) .Marland KitchenMarland KitchenMarland KitchenMarland Kitchen  1po once daily  Orders: TLB-Microalbumin/Creat Ratio, Urine (82043-MALB) TLB-A1C / Hgb A1C (Glycohemoglobin) (83036-A1C)  Labs Reviewed: Creat: 0.8 (03/20/2009)    Reviewed HgBA1c results: 9.7 (06/19/2009)  9.9 (03/13/2009) recently uncontrolled, better symptoms now;  to cont f/u with dr Everardo All  Problem # 4:  MUSCLE STRAIN, RIGHT BUTTOCK (ICD-848.8) c/w above, for oxycodone as needed for now  Problem # 5:  ALLERGIC RHINITIS (ICD-477.9)  Her updated medication list for this problem includes:    Fexofenadine Hcl 180 Mg Tabs (Fexofenadine hcl) .Marland Kitchen... 1po once daily as needed allergies treat as above, f/u any worsening signs or symptoms   Complete Medication List: 1)  Aurora Lancet Super Thin 30g Misc (Lancets) .... Use 1 stick as directed once  a day 2)  Indomethacin 50 Mg Caps (Indomethacin) .Marland Kitchen.. 1 by mouth three times a day as needed gout 3)  Diclofenac Sodium 75 Mg Tbec (Diclofenac sodium) .... Take 1 tablet by mouth twice a day 4)  Furosemide 80 Mg Tabs (Furosemide) .Marland Kitchen.. 1 by mouth qd 5)  Onetouch Ultra Test Strp (Glucose blood) .... Use asd 1 strip tid 6)  Omeprazole 20 Mg Cpdr (Omeprazole) .... 2 by mouth once daily 7)  Plavix 75 Mg Tabs (Clopidogrel bisulfate) .Marland Kitchen.. 1po qd 8)  Ecotrin Low Strength 81 Mg Tbec (Aspirin) .Marland Kitchen.. 1po qd 9)  Nitroquick 0.4 Mg Subl (Nitroglycerin) .... Use asd 10)  Pravachol 40 Mg Tabs (Pravastatin sodium) .Marland Kitchen.. 1 by mouth qd 11)  Klor-con 20 Meq Pack (Potassium chloride) .... Take 1 tablet by mouth two times a day 12)  Allopurinol 100 Mg Tabs (Allopurinol) .Marland Kitchen.. 1po once daily 13)  Onetouch Ultra Test Strp  (Glucose blood) .... Two times a day, and lancets 250.00 14)  Novolin 70/30 70-30 % Susp (Insulin isophane & regular) .... 50 units qam and 35 units qpm 15)  Relion Insulin Syringe 31g X 5/16" 0.5 Ml Misc (Insulin syringe-needle u-100) .... Bid 16)  Labetalol Hcl 200 Mg Tabs (Labetalol hcl) .Marland Kitchen.. 1 by mouth two times a day 17)  Oxycodone Hcl 5 Mg Tabs (Oxycodone hcl) .Marland Kitchen.. 1  - 2  tabs by mouth q 6 hrs as needed pain 18)  Losartan Potassium 100 Mg Tabs (Losartan potassium) .Marland Kitchen.. 1po once daily 19)  Methocarbamol 500 Mg Tabs (Methocarbamol) .Marland Kitchen.. 1 q6h as needed cramps 20)  Fexofenadine Hcl 180 Mg Tabs (Fexofenadine hcl) .Marland Kitchen.. 1po once daily as needed allergies  Other Orders: Orthopedic Surgeon Referral (Ortho Surgeon)   Patient Instructions: 1)  please call for your yearly mammogram 2)  Please go to the Lab in the basement for your blood and/or urine tests today 3)  stop the amlodipine 4)  start the labetolol 200 mg twice per day 5)  Please take aother new medications as prescribed  - the generic allegra as needed for allergies 6)  Continue all previous medications as before this visit, including the oxycodone 7)  You will be contacted about the referral(s) to: Dr Darrelyn Hillock for the back pain 8)  Please schedule a follow-up appointment in 6 mo with: 9)  BMP prior to visit, ICD-9: 250.02 10)  Lipid Panel prior to visit, ICD-9: 11)  HbgA1C prior to visit, ICD-9: 12)  Check your Blood Pressure regularly. If it is above 130/85: you should make an appointment sooner Prescriptions: LABETALOL HCL 200 MG TABS (LABETALOL HCL) 1 by mouth two times a day  #60 x 11   Entered and Authorized by:   Corwin Levins MD   Signed by:  Corwin Levins MD on 09/11/2009   Method used:   Print then Give to Patient   RxID:   [id number] OXYCODONE HCL 5 MG TABS (OXYCODONE HCL) 1  - 2  tabs by mouth q 6 hrs as needed pain  #100 x 0   Entered and Authorized by:   Corwin Levins MD   Signed by:   Corwin Levins MD on  09/11/2009   Method used:   Print then Give to Patient   RxID:   [id number] FEXOFENADINE HCL 180 MG TABS (FEXOFENADINE HCL) 1po once daily as needed allergies  #30 x 11   Entered and Authorized by:   Corwin Levins MD   Signed by:   Corwin Levins MD on 09/11/2009   Method used:   Print then Give to Patient   RxID:   [id number]

## 2010-05-13 NOTE — Letter (Signed)
Summary: [facility] Office Visit   [facility] Office Visit   Imported By: Roderic Ovens 04/02/2010 11:07:35  _____________________________________________________________________  External Attachment:    Type:   Image     Comment:   External Document

## 2010-05-13 NOTE — Assessment & Plan Note (Signed)
Summary: FU ON HEART RATE PER DR ELLISON/NWS   Vital Signs:  Patient profile:   77 year old female Height:      67 inches Weight:      214.25 pounds BMI:     33.68 O2 Sat:      97 % on Room air Temp:     98.5 degrees F oral Pulse rate:   57 / minute BP sitting:   130 / 62  (right arm) Cuff size:   large  Vitals Entered By: Zella Ball Ewing CMA (AAMA) (October 30, 2009 3:15 PM)  O2 Flow:  Room air CC: Followup on Heart rate/RE   Primary Care Provider:  Oliver Barre  CC:  Followup on Heart rate/RE.  History of Present Illness: here after seeing Dr Everardo All who noted low HR and pt asked to return to me later in the day, and labetolol decreased from 200 bid to 100 two times a day ;  pt somewhat near tearful  - "I just feel bad" and very difficult to be more specific;  Pt denies CP, orthopnea, pnd, worsening LE edema, palps, or syncope , but has and 4 -5 days worsening fatigue, sob/doe and ? wheezing to the mid upper chest.  Pt denies new neuro symptoms such as headache, facial or extremity weakness   No fever, wt loss, night sweats, loss of appetite or other constitutional symptoms, but can walk about 30 ft or less before onset increaesed weakness, and dizziness/lightheaded.  No overt bleeing on the plavix, or bruising.  Recent labs reviewed  with pt. Overall  good med tolerability until recent  Pt denies polydipsia, polyuria, or low sugar symptoms such as shakiness improved with eating.  Overall good compliance with meds, trying to follow low chol, DM diet, wt stable, little excercise however  Has hx of CAD with stents - sees Dr Excell Seltzer in the past. No GU symptoms such as dysuria, freq, urgency. No chills.    Problems Prior to Update: 1)  Sinus Bradycardia  (ICD-427.81) 2)  Back Pain  (ICD-724.5) 3)  Allergic Rhinitis  (ICD-477.9) 4)  Muscle Strain, Right Buttock  (ICD-848.8) 5)  Shingles  (ICD-053.9) 6)  Shoulder Pain, Left  (ICD-719.41) 7)  Proteinuria  (ICD-791.0) 8)  Preventive Health Care   (ICD-V70.0) 9)  Abdominal Pain, Unspecified Site  (ICD-789.00) 10)  Finger Pain  (ICD-729.5) 11)  Coronary Artery Disease  (ICD-414.00) 12)  Chest Pain  (ICD-786.50) 13)  Dizziness  (ICD-780.4) 14)  Peripheral Edema  (ICD-782.3) 15)  Family History of Cad Female 1st Degree Relative <50  (ICD-V17.3) 16)  Spinal Stenosis, Lumbar  (ICD-724.02) 17)  Disc Disease, Cervical  (ICD-722.4) 18)  Helicobacter Pylori Gastritis, Hx of  (ICD-V12.79) 19)  Gastroparesis  (ICD-536.3) 20)  Barrett's Esophagus, Hx of  (ICD-V12.79) 21)  Gerd  (ICD-530.81) 22)  Disc Disease, Lumbar  (ICD-722.52) 23)  Anxiety  (ICD-300.00) 24)  Low Back Pain  (ICD-724.2) 25)  Hyperlipidemia  (ICD-272.4) 26)  Osteopenia  (ICD-733.90) 27)  Osteoarthritis  (ICD-715.90) 28)  Depression  (ICD-311) 29)  Gout  (ICD-274.9) 30)  Morbid Obesity  (ICD-278.01) 31)  Diverticulosis, Colon  (ICD-562.10) 32)  Peripheral Vascular Disease  (ICD-443.9) 33)  Diabetes Mellitus, Type II  (ICD-250.00) 34)  Hypertension  (ICD-401.9)  Medications Prior to Update: 1)  Aurora Lancet Super Thin 30g  Misc (Lancets) .... Use 1 Stick As Directed Once  A Day 2)  Indomethacin 50 Mg Caps (Indomethacin) .Marland Kitchen.. 1 By Mouth Three Times A Day As  Needed Gout 3)  Diclofenac Sodium 75 Mg Tbec (Diclofenac Sodium) .... Take 1 Tablet By Mouth Twice A Day 4)  Furosemide 80 Mg Tabs (Furosemide) .Marland Kitchen.. 1 By Mouth Qd 5)  Onetouch Ultra Test   Strp (Glucose Blood) .... Use Asd 1 Strip Tid 6)  Omeprazole 20 Mg  Cpdr (Omeprazole) .... 2 By Mouth Once Daily 7)  Plavix 75 Mg  Tabs (Clopidogrel Bisulfate) .Marland Kitchen.. 1po Qd 8)  Ecotrin Low Strength 81 Mg  Tbec (Aspirin) .Marland Kitchen.. 1po Qd 9)  Nitroquick 0.4 Mg  Subl (Nitroglycerin) .... Use Asd 10)  Pravachol 40 Mg  Tabs (Pravastatin Sodium) .Marland Kitchen.. 1 By Mouth Qd 11)  Klor-Con 20 Meq Pack (Potassium Chloride) .... Take 1 Tablet By Mouth Two Times A Day 12)  Allopurinol 100 Mg Tabs (Allopurinol) .Marland Kitchen.. 1po Once Daily 13)  Onetouch Ultra Test   Strp (Glucose Blood) .... Two Times A Day, and Lancets 250.00 14)  Relion Insulin Syringe 31g X 5/16" 0.5 Ml Misc (Insulin Syringe-Needle U-100) .... Bid 15)  Oxycodone Hcl 5 Mg Tabs (Oxycodone Hcl) .Marland Kitchen.. 1  - 2  Tabs By Mouth Q 6 Hrs As Needed Pain 16)  Losartan Potassium 100 Mg Tabs (Losartan Potassium) .Marland Kitchen.. 1po Once Daily 17)  Methocarbamol 500 Mg Tabs (Methocarbamol) .Marland Kitchen.. 1 Q6h As Needed Cramps 18)  Fexofenadine Hcl 180 Mg Tabs (Fexofenadine Hcl) .Marland Kitchen.. 1po Once Daily As Needed Allergies 19)  Labetalol Hcl 100 Mg Tabs (Labetalol Hcl) .Marland Kitchen.. 1 Tab Two Times A Day 20)  Humulin N 100 Unit/ml Susp (Insulin Isophane Human) .... 50 Units Am and 35 Units Pm  Current Medications (verified): 1)  Aurora Lancet Super Thin 30g  Misc (Lancets) .... Use 1 Stick As Directed Once  A Day 2)  Indomethacin 50 Mg Caps (Indomethacin) .Marland Kitchen.. 1 By Mouth Three Times A Day As Needed Gout 3)  Diclofenac Sodium 75 Mg Tbec (Diclofenac Sodium) .... Take 1 Tablet By Mouth Twice A Day 4)  Furosemide 80 Mg Tabs (Furosemide) .Marland Kitchen.. 1 By Mouth Two Times A Day 5)  Onetouch Ultra Test   Strp (Glucose Blood) .... Use Asd 1 Strip Tid 6)  Omeprazole 20 Mg  Cpdr (Omeprazole) .... 2 By Mouth Once Daily 7)  Plavix 75 Mg  Tabs (Clopidogrel Bisulfate) .Marland Kitchen.. 1po Qd 8)  Ecotrin Low Strength 81 Mg  Tbec (Aspirin) .Marland Kitchen.. 1po Qd 9)  Nitroquick 0.4 Mg  Subl (Nitroglycerin) .... Use Asd 10)  Pravachol 40 Mg  Tabs (Pravastatin Sodium) .Marland Kitchen.. 1 By Mouth Qd 11)  Klor-Con 20 Meq Pack (Potassium Chloride) .... Take 1 Tablet By Mouth Two Times A Day 12)  Allopurinol 100 Mg Tabs (Allopurinol) .Marland Kitchen.. 1po Once Daily 13)  Onetouch Ultra Test  Strp (Glucose Blood) .... Two Times A Day, and Lancets 250.00 14)  Relion Insulin Syringe 31g X 5/16" 0.5 Ml Misc (Insulin Syringe-Needle U-100) .... Bid 15)  Oxycodone Hcl 5 Mg Tabs (Oxycodone Hcl) .Marland Kitchen.. 1  - 2  Tabs By Mouth Q 6 Hrs As Needed Pain 16)  Losartan Potassium 100 Mg Tabs (Losartan Potassium) .Marland Kitchen.. 1po Once  Daily 17)  Methocarbamol 500 Mg Tabs (Methocarbamol) .Marland Kitchen.. 1 Q6h As Needed Cramps 18)  Fexofenadine Hcl 180 Mg Tabs (Fexofenadine Hcl) .Marland Kitchen.. 1po Once Daily As Needed Allergies 19)  Humulin N 100 Unit/ml Susp (Insulin Isophane Human) .... 50 Units Am and 35 Units Pm  Allergies (verified): 1)  ! Sulfa 2)  ! Pcn 3)  ! Metformin Hcl (Metformin Hcl) 4)  ! Cardizem La (  Diltiazem Hcl Coated Beads) 5)  ! Lovastatin (Lovastatin) 6)  ! * Shellfish  Past History:  Social History: Last updated: 09/11/2009 Former Smoker Alcohol use-no retired widowed 2004 Drug use-no  Risk Factors: Smoking Status: quit (02/15/2007)  Past Medical History: Hypertension Diabetes mellitus, type II Peripheral vascular disease Diverticulosis, colon Morbid Obesity Gout Depression Osteoarthritis Osteopenia Hyperlipidemia Low back pain/'lumbar disc dz/lumbar spinal stenosis cervical spine disc dz Anxiety GERD Barrett's esophagus gastroparesis hx of h pylori gastritis Coronary artery disease Allergic rhinitis  Past Surgical History: Cholecystectomy Hysterectomy Tubal ligation Ovarian Cystectomies s/p stent x 3 - Dr Excell Seltzer  Review of Systems       all otherwise negative per pt -    Physical Exam  General:  alert and overweight-appearing.   Head:  normocephalic and atraumatic.   Eyes:  vision grossly intact, pupils equal, and pupils round.   Ears:  R ear normal and L ear normal.   Nose:  no external deformity and no nasal discharge.   Mouth:  no gingival abnormalities and pharynx pink and moist.   Neck:  supple and no masses.   Lungs:  normal respiratory effort and normal breath sounds.   Heart:  normal rate and regular rhythm.   Abdomen:  soft, non-tender, and normal bowel sounds.   Msk:  no joint tenderness and no joint swelling.   Extremities:  1+ bilat edema, no ulcers   Impression & Recommendations:  Problem # 1:  SINUS BRADYCARDIA (ICD-427.81)  The following medications were  removed from the medication list:    Labetalol Hcl 100 Mg Tabs (Labetalol hcl) .Marland Kitchen... 1 tab two times a day Her updated medication list for this problem includes:    Plavix 75 Mg Tabs (Clopidogrel bisulfate) .Marland Kitchen... 1po qd    Ecotrin Low Strength 81 Mg Tbec (Aspirin) .Marland Kitchen... 1po qd to d/c the labetolol completely at this point due to severity of symtpoms.    Orders: EKG w/ Interpretation (93000)  Problem # 2:  PERIPHERAL EDEMA (ICD-782.3)  Her updated medication list for this problem includes:    Furosemide 80 Mg Tabs (Furosemide) .Marland Kitchen... 1 by mouth two times a day to incr the lasix as above, f/u mon july 25  Problem # 3:  HYPERTENSION (ICD-401.9)  The following medications were removed from the medication list:    Labetalol Hcl 100 Mg Tabs (Labetalol hcl) .Marland Kitchen... 1 tab two times a day Her updated medication list for this problem includes:    Furosemide 80 Mg Tabs (Furosemide) .Marland Kitchen... 1 by mouth two times a day    Losartan Potassium 100 Mg Tabs (Losartan potassium) .Marland Kitchen... 1po once daily  BP today: 130/62 Prior BP: 146/90 (09/11/2009)  Prior 10 Yr Risk Heart Disease: N/A (09/11/2009)  Labs Reviewed: K+: 4.2 (09/11/2009) Creat: : 0.8 (09/11/2009)   Chol: 152 (09/11/2009)   HDL: 40.00 (09/11/2009)   LDL: 78 (09/02/2008)   TG: 216.0 (09/11/2009) stable overall by hx and exam, ok to continue meds/tx as is , f/u BP at home, and next visit  Complete Medication List: 1)  Aurora Lancet Super Thin 30g Misc (Lancets) .... Use 1 stick as directed once  a day 2)  Indomethacin 50 Mg Caps (Indomethacin) .Marland Kitchen.. 1 by mouth three times a day as needed gout 3)  Diclofenac Sodium 75 Mg Tbec (Diclofenac sodium) .... Take 1 tablet by mouth twice a day 4)  Furosemide 80 Mg Tabs (Furosemide) .Marland Kitchen.. 1 by mouth two times a day 5)  Onetouch Ultra Test Strp (Glucose blood) .Marland KitchenMarland KitchenMarland Kitchen  Use asd 1 strip tid 6)  Omeprazole 20 Mg Cpdr (Omeprazole) .... 2 by mouth once daily 7)  Plavix 75 Mg Tabs (Clopidogrel bisulfate) .Marland Kitchen.. 1po  qd 8)  Ecotrin Low Strength 81 Mg Tbec (Aspirin) .Marland Kitchen.. 1po qd 9)  Nitroquick 0.4 Mg Subl (Nitroglycerin) .... Use asd 10)  Pravachol 40 Mg Tabs (Pravastatin sodium) .Marland Kitchen.. 1 by mouth qd 11)  Klor-con 20 Meq Pack (Potassium chloride) .... Take 1 tablet by mouth two times a day 12)  Allopurinol 100 Mg Tabs (Allopurinol) .Marland Kitchen.. 1po once daily 13)  Onetouch Ultra Test Strp (Glucose blood) .... Two times a day, and lancets 250.00 14)  Relion Insulin Syringe 31g X 5/16" 0.5 Ml Misc (Insulin syringe-needle u-100) .... Bid 15)  Oxycodone Hcl 5 Mg Tabs (Oxycodone hcl) .Marland Kitchen.. 1  - 2  tabs by mouth q 6 hrs as needed pain 16)  Losartan Potassium 100 Mg Tabs (Losartan potassium) .Marland Kitchen.. 1po once daily 17)  Methocarbamol 500 Mg Tabs (Methocarbamol) .Marland Kitchen.. 1 q6h as needed cramps 18)  Fexofenadine Hcl 180 Mg Tabs (Fexofenadine hcl) .Marland Kitchen.. 1po once daily as needed allergies 19)  Humulin N 100 Unit/ml Susp (Insulin isophane human) .... 50 units am and 35 units pm  Patient Instructions: 1)  Please stop the labetolol completely as the HR is still too low to keep taking. 2)  increase the furosemide to 80 mg two times a day  3)  Please schedule a follow-up appointment in 3 days 4)  Please call for any onset fever, CP or worsening dizziness or falls Prescriptions: FUROSEMIDE 80 MG TABS (FUROSEMIDE) 1 by mouth two times a day  #60 x 11   Entered and Authorized by:   Corwin Levins MD   Signed by:   Corwin Levins MD on 10/30/2009   Method used:   Print then Give to Patient   RxID:   [id number]

## 2010-05-13 NOTE — Consult Note (Signed)
Summary: [facility]   [place]   Imported By: Roderic Ovens 02/20/2010 13:24:19  _____________________________________________________________________  External Attachment:    Type:   Image     Comment:   External Document

## 2010-05-13 NOTE — Progress Notes (Signed)
Summary: Cellulitis?  Phone Note Call from Patient Call back at Home Phone [phone]   Caller: Patient Summary of Call: Pt called stating that while having ECHO she wa told that leg pain was due to cellulitis and report will be faxed to MD. Pt states her legs are very itchy and she is requesting treatment, please advise, have you recieved faxed report? Initial call taken by: Margaret Pyle, CMA,  April 06, 2010 11:38 AM  Follow-up for Phone Call        this did not seem obvious at her last visit, but I suppose could have been more evident by the time she had her LE dopplers done  ok for doxy course - done per emr Follow-up by: Corwin Levins MD,  April 06, 2010 1:01 PM  Additional Follow-up for Phone Call Additional follow up Details #1::        Pt advised to check with pharmacy per scheduler Additional Follow-up by: Margaret Pyle, CMA,  April 06, 2010 1:40 PM    New/Updated Medications: DOXYCYCLINE HYCLATE 100 MG CAPS (DOXYCYCLINE HYCLATE) 1 by mouth two times a day Prescriptions: DOXYCYCLINE HYCLATE 100 MG CAPS (DOXYCYCLINE HYCLATE) 1 by mouth two times a day  #20 x 0   Entered and Authorized by:   Corwin Levins MD   Signed by:   Corwin Levins MD on 04/06/2010   Method used:   Electronically to        [facility] (retail)       [address]       [place], [place]  [address]       Ph: [phone]       Fax: [phone]   RxID:   [id number]

## 2010-05-13 NOTE — Letter (Signed)
Summary: [facility]  [facility]   Imported By: Sherian Rein 03/24/2010 11:04:58  _____________________________________________________________________  External Attachment:    Type:   Image     Comment:   External Document

## 2010-05-13 NOTE — Cardiovascular Report (Signed)
Summary: Pre Cath/Percutaneous Orders   Pre Cath/Percutaneous Orders   Imported By: Roderic Ovens 02/22/2010 14:27:05  _____________________________________________________________________  External Attachment:    Type:   Image     Comment:   External Document

## 2010-05-13 NOTE — Assessment & Plan Note (Signed)
Summary: 6 WK ROV /NWS  #   Vital Signs:  Patient profile:   77 year old female Height:      67 inches (170.18 cm) Weight:      236.25 pounds (107.39 kg) BMI:     37.14 O2 Sat:      95 % on Room air Temp:     97.2 degrees F (36.22 degrees C) oral Pulse rate:   50 / minute BP sitting:   132 / 62  (left arm) Cuff size:   large  Vitals Entered By: Brenton Grills MA (December 11, 2009 9:00 AM)  O2 Flow:  Room air CC: 6 week F/U/pt is taking Amlodipine but is unsure of dosage/aj Is Patient Diabetic? Yes   Primary Arvis Zwahlen:  Oliver Barre  CC:  6 week F/U/pt is taking Amlodipine but is unsure of dosage/aj.  History of Present Illness: pt states she feels well in general.  no cbg record, but states cbg's vary from 90 to 300.  it is in general higher as the day goes on.    Current Medications (verified): 1)  Aurora Lancet Super Thin 30g  Misc (Lancets) .... Use 1 Stick As Directed Once  A Day 2)  Indomethacin 50 Mg Caps (Indomethacin) .Marland Kitchen.. 1 By Mouth Three Times A Day As Needed Gout 3)  Diclofenac Sodium 75 Mg Tbec (Diclofenac Sodium) .... Take 1 Tablet By Mouth Twice A Day 4)  Furosemide 80 Mg Tabs (Furosemide) .Marland Kitchen.. 1 By Mouth Qam 5)  Onetouch Ultra Test   Strp (Glucose Blood) .... Use Asd 1 Strip Tid 6)  Omeprazole 20 Mg  Cpdr (Omeprazole) .... 2 By Mouth Once Daily 7)  Plavix 75 Mg  Tabs (Clopidogrel Bisulfate) .Marland Kitchen.. 1po Qd 8)  Ecotrin Low Strength 81 Mg  Tbec (Aspirin) .Marland Kitchen.. 1po Qd 9)  Nitroquick 0.4 Mg  Subl (Nitroglycerin) .... Use Asd 10)  Pravachol 40 Mg  Tabs (Pravastatin Sodium) .Marland Kitchen.. 1 By Mouth Qd 11)  Potassium Chloride Crys Cr 20 Meq Cr-Tabs (Potassium Chloride Crys Cr) .... Take One Tablet By Mouth Twice A Day 12)  Allopurinol 100 Mg Tabs (Allopurinol) .Marland Kitchen.. 1po Once Daily 13)  Onetouch Ultra Test  Strp (Glucose Blood) .... Two Times A Day, and Lancets 250.00 14)  Relion Insulin Syringe 31g X 5/16" 0.5 Ml Misc (Insulin Syringe-Needle U-100) .... Bid 15)  Oxycodone Hcl 5 Mg  Tabs (Oxycodone Hcl) .Marland Kitchen.. 1  - 2  Tabs By Mouth Q 6 Hrs As Needed Pain 16)  Methocarbamol 500 Mg Tabs (Methocarbamol) .Marland Kitchen.. 1 Q6h As Needed Cramps 17)  Fexofenadine Hcl 180 Mg Tabs (Fexofenadine Hcl) .Marland Kitchen.. 1po Once Daily As Needed Allergies 18)  Humulin N 100 Unit/ml Susp (Insulin Isophane Human) .... 55 Units Am and 40 Units Pm  Allergies (verified): 1)  ! Sulfa 2)  ! Pcn 3)  ! Metformin Hcl (Metformin Hcl) 4)  ! Cardizem La (Diltiazem Hcl Coated Beads) 5)  ! Lovastatin (Lovastatin) 6)  ! * Shellfish  Past History:  Past Medical History: Last updated: 11/20/2009 Multivessel coronary artery disease Hypertension Diabetes mellitus, type II Peripheral vascular disease Diverticulosis, colon Morbid Obesity Gout Depression Osteoarthritis Osteopenia Hyperlipidemia Low back pain/'lumbar disc dz/lumbar spinal stenosis cervical spine disc dz thrombocytopenia hx of Anxiety GERD Barrett's esophagus gastroparesis hx of h pylori gastritis Coronary artery disease Allergic rhinitis  Review of Systems  The patient denies hypoglycemia.    Physical Exam  General:  normal appearance.   Pulses:  dorsalis pedis intact bilat.  Extremities:  no deformity.  no ulcer on the feet.  feet are of normal color and temp.  no edema mycotic toenails.   Neurologic:  sensation is intact to touch on the feet  Additional Exam:  Hemoglobin A1C       [H]  11.1 %      Impression & Recommendations:  Problem # 1:  DIABETES MELLITUS, TYPE II (ICD-250.00) needs increased rx  Medications Added to Medication List This Visit: 1)  Humulin N 100 Unit/ml Susp (Insulin isophane human) .... 70 units am and 30 units pm 2)  Humulin N 100 Unit/ml Susp (Insulin isophane human) .... 80 units am and 30 units pm  Other Orders: TLB-A1C / Hgb A1C (Glycohemoglobin) (83036-A1C) Est. Patient Level III (04540)  Patient Instructions: 1)  check your blood sugar 2 times a day.  vary the time of day when you check,  between before the 3 meals, and at bedtime.  also check if you have symptoms of your blood sugar being too high or too low.  please keep a record of the readings and bring it to your next appointment here.  please call us sooner if you are having low blood sugar episodes. 2)  blood tests are being ordered for you today.  please call [phone] to hear your test results. 3)  pending the test results, please change humulin nph to 70 units am and 30 units pm. 4)  return 1 month. 5)  (update: i left message on phone-tree:  increase insulin to 80 units am and 30 units pm). Prescriptions: HUMULIN N 100 UNIT/ML SUSP (INSULIN ISOPHANE HUMAN) 70 units am and 30 units pm  #4 vials x 11   Entered and Authorized by:   Minus Breeding MD   Signed by:   Minus Breeding MD on 12/11/2009   Method used:   Electronically to        [facility] (retail)       [address]       [place], [place]  [address]       Ph: [phone]       Fax: [phone]   RxID:   [id number]

## 2010-05-13 NOTE — Letter (Signed)
[facility]   Imported By: Marylou Mccoy 03/23/2010 16:49:31  _____________________________________________________________________  External Attachment:    Type:   Image     Comment:   External Document

## 2010-05-13 NOTE — Medication Information (Signed)
Summary: [facility] Supp[facility]  Di[facility] Supplies/[facility]   Imported By: Sherian Rein 03/01/2010 07:42:01  _____________________________________________________________________  External Attachment:    Type:   Image     Comment:   External Document

## 2010-05-13 NOTE — Assessment & Plan Note (Signed)
Summary: FU [name]   Vital Signs:  Patient profile:   77 year old female Height:      67 inches (170.18 cm) Weight:      241 pounds (109.55 kg) BMI:     37.88 O2 Sat:      98 % on Room air Temp:     97.8 degrees F (36.56 degrees C) oral Pulse rate:   48 / minute BP sitting:   122 / 64  (left arm) Cuff size:   large  Vitals Entered By: Brenton Grills MA (October 30, 2009 9:07 AM)  O2 Flow:  Room air CC: F/U appt/aj Is Patient Diabetic? Yes   Primary Provider:  Oliver Barre  CC:  F/U appt/aj.  History of Present Illness: no cbg record, but states "the last time i checked it, it was 90."  pt says she never misses the insulin.   she has multiple previously-evaluated sxs, including lightheadedness.  Current Medications (verified): 1)  Aurora Lancet Super Thin 30g  Misc (Lancets) .... Use 1 Stick As Directed Once  A Day 2)  Indomethacin 50 Mg Caps (Indomethacin) .Marland Kitchen.. 1 By Mouth Three Times A Day As Needed Gout 3)  Diclofenac Sodium 75 Mg Tbec (Diclofenac Sodium) .... Take 1 Tablet By Mouth Twice A Day 4)  Furosemide 80 Mg Tabs (Furosemide) .Marland Kitchen.. 1 By Mouth Qd 5)  Onetouch Ultra Test   Strp (Glucose Blood) .... Use Asd 1 Strip Tid 6)  Omeprazole 20 Mg  Cpdr (Omeprazole) .... 2 By Mouth Once Daily 7)  Plavix 75 Mg  Tabs (Clopidogrel Bisulfate) .Marland Kitchen.. 1po Qd 8)  Ecotrin Low Strength 81 Mg  Tbec (Aspirin) .Marland Kitchen.. 1po Qd 9)  Nitroquick 0.4 Mg  Subl (Nitroglycerin) .... Use Asd 10)  Pravachol 40 Mg  Tabs (Pravastatin Sodium) .Marland Kitchen.. 1 By Mouth Qd 11)  Klor-Con 20 Meq Pack (Potassium Chloride) .... Take 1 Tablet By Mouth Two Times A Day 12)  Allopurinol 100 Mg Tabs (Allopurinol) .Marland Kitchen.. 1po Once Daily 13)  Onetouch Ultra Test  Strp (Glucose Blood) .... Two Times A Day, and Lancets 250.00 14)  Novolin 70/30 70-30 % Susp (Insulin Isophane & Regular) .... 50 Units Qam and 35 Units Qpm 15)  Relion Insulin Syringe 31g X 5/16" 0.5 Ml Misc (Insulin Syringe-Needle U-100) .... Bid 16)  Labetalol Hcl 200 Mg Tabs  (Labetalol Hcl) .Marland Kitchen.. 1 By Mouth Two Times A Day 17)  Oxycodone Hcl 5 Mg Tabs (Oxycodone Hcl) .Marland Kitchen.. 1  - 2  Tabs By Mouth Q 6 Hrs As Needed Pain 18)  Losartan Potassium 100 Mg Tabs (Losartan Potassium) .Marland Kitchen.. 1po Once Daily 19)  Methocarbamol 500 Mg Tabs (Methocarbamol) .Marland Kitchen.. 1 Q6h As Needed Cramps 20)  Fexofenadine Hcl 180 Mg Tabs (Fexofenadine Hcl) .Marland Kitchen.. 1po Once Daily As Needed Allergies  Allergies (verified): 1)  ! Sulfa 2)  ! Pcn 3)  ! Metformin Hcl (Metformin Hcl) 4)  ! Cardizem La (Diltiazem Hcl Coated Beads) 5)  ! Lovastatin (Lovastatin) 6)  ! * Shellfish  Past History:  Past Medical History: Last updated: 09/11/2009 Hypertension Diabetes mellitus, type II Peripheral vascular disease Diverticulosis, colon Morbid Obesity Gout Depression Osteoarthritis Osteopenia Hyperlipidemia Low back pain/'lumbar disc dz/lumbar spinal stenosis cervical spine disc dz Anxiety GERD Barrett's esophagus gastroparesis hx of h pylori gastritis Coronary artery disease Allergic rhinitis  Review of Systems  The patient denies syncope.         denies hypoglycemia  Physical Exam  General:  morbidly obese.  no distress  Msk:  gait is normal and steady    Impression & Recommendations:  Problem # 1:  DIABETES MELLITUS, TYPE II (ICD-250.00) therapy limited by noncompliance.  i'll do the best i can. she may do better with simpler regimen  Problem # 2:  bradycardia prob due to labetalol.  Medications Added to Medication List This Visit: 1)  Labetalol Hcl 100 Mg Tabs (Labetalol hcl) .Marland Kitchen.. 1 tab two times a day 2)  Humulin N 100 Unit/ml Susp (Insulin isophane human) .... 50 units am and 35 units pm  Other Orders: Est. Patient Level III (63875)  Patient Instructions: 1)  check your blood sugar 2 times a day.  vary the time of day when you check, between before the 3 meals, and at bedtime.  also check if you have symptoms of your blood sugar being too high or too low.  please keep a  record of the readings and bring it to your next appointment here.  please call us sooner if you are having low blood sugar episodes. 2)  change current insulin to humulin nph, 50 units am and 35 units pm. 3)  return 6 weeks. 4)  reduce labetalol to 100 mg two times a day. 5)  see dr Jonny Ruiz soon, to recheck your heart rate. Prescriptions: HUMULIN N 100 UNIT/ML SUSP (INSULIN ISOPHANE HUMAN) 50 units am and 35 units pm  #3 vials x 11   Entered and Authorized by:   Minus Breeding MD   Signed by:   Minus Breeding MD on 10/30/2009   Method used:   Electronically to        [facility] (retail)       [address]       [place], [place]  [address]       Ph: [phone]       Fax: [phone]   RxID:   [id number] LABETALOL HCL 100 MG TABS (LABETALOL HCL) 1 tab two times a day  #60 x 11   Entered and Authorized by:   Minus Breeding MD   Signed by:   Minus Breeding MD on 10/30/2009   Method used:   Electronically to        [facility] (retail)       [address]       [place], [place]  [address]       Ph: [phone]       Fax: [phone]   RxID:   [id number]

## 2010-05-13 NOTE — Assessment & Plan Note (Signed)
Summary: 3 DY FU---STC   Vital Signs:  Patient profile:   77 year old female Height:      67 inches Weight:      232 pounds BMI:     36.47 O2 Sat:      96 % on Room air Temp:     97.8 degrees F oral Pulse rate:   55 / minute BP sitting:   122 / 62  (left arm) Cuff size:   large  Vitals Entered By: Zella Ball Ewing CMA (AAMA) (November 02, 2009 2:11 PM)  O2 Flow:  Room air CC: 3 day followup/RE   Primary Care Provider:  Oliver Barre  CC:  3 day followup/RE.  History of Present Illness: here to f/u - did stop the labetolol and HR in the 50's;  did increase the lasix to two times a day, states overall still feels weak but dizziness much improved, and Pt denies CP,  wheezing, orthopnea, pnd, worsening LE edema, palps, dizziness or syncope, although still has some sob. Marland Kitchen  Lost wt from 241 to 232 , mostly fluid wt per pt, and LE edema near resolved.   Still with marked sugar out of control despite current meds, trying to follow diet, good complaicne.  No recent other acute illness  - cbg's mostly in the 200 , occas 300.  No fever, no GU symptoms.  No fever, wt loss, night sweats, loss of appetite or other constitutional symptoms   also mentioned most recent dxq - went over results with pt in the office - pt to take ca/vit d  Problems Prior to Update: 1)  Sinus Bradycardia  (ICD-427.81) 2)  Back Pain  (ICD-724.5) 3)  Allergic Rhinitis  (ICD-477.9) 4)  Muscle Strain, Right Buttock  (ICD-848.8) 5)  Shingles  (ICD-053.9) 6)  Shoulder Pain, Left  (ICD-719.41) 7)  Proteinuria  (ICD-791.0) 8)  Preventive Health Care  (ICD-V70.0) 9)  Abdominal Pain, Unspecified Site  (ICD-789.00) 10)  Finger Pain  (ICD-729.5) 11)  Coronary Artery Disease  (ICD-414.00) 12)  Chest Pain  (ICD-786.50) 13)  Dizziness  (ICD-780.4) 14)  Peripheral Edema  (ICD-782.3) 15)  Family History of Cad Female 1st Degree Relative <50  (ICD-V17.3) 16)  Spinal Stenosis, Lumbar  (ICD-724.02) 17)  Disc Disease, Cervical   (ICD-722.4) 18)  Helicobacter Pylori Gastritis, Hx of  (ICD-V12.79) 19)  Gastroparesis  (ICD-536.3) 20)  Barrett's Esophagus, Hx of  (ICD-V12.79) 21)  Gerd  (ICD-530.81) 22)  Disc Disease, Lumbar  (ICD-722.52) 23)  Anxiety  (ICD-300.00) 24)  Low Back Pain  (ICD-724.2) 25)  Hyperlipidemia  (ICD-272.4) 26)  Osteopenia  (ICD-733.90) 27)  Osteoarthritis  (ICD-715.90) 28)  Depression  (ICD-311) 29)  Gout  (ICD-274.9) 30)  Morbid Obesity  (ICD-278.01) 31)  Diverticulosis, Colon  (ICD-562.10) 32)  Peripheral Vascular Disease  (ICD-443.9) 33)  Diabetes Mellitus, Type II  (ICD-250.00) 34)  Hypertension  (ICD-401.9)  Medications Prior to Update: 1)  Aurora Lancet Super Thin 30g  Misc (Lancets) .... Use 1 Stick As Directed Once  A Day 2)  Indomethacin 50 Mg Caps (Indomethacin) .Marland Kitchen.. 1 By Mouth Three Times A Day As Needed Gout 3)  Diclofenac Sodium 75 Mg Tbec (Diclofenac Sodium) .... Take 1 Tablet By Mouth Twice A Day 4)  Furosemide 80 Mg Tabs (Furosemide) .Marland Kitchen.. 1 By Mouth Two Times A Day 5)  Onetouch Ultra Test   Strp (Glucose Blood) .... Use Asd 1 Strip Tid 6)  Omeprazole 20 Mg  Cpdr (Omeprazole) .... 2 By Mouth Once  Daily 7)  Plavix 75 Mg  Tabs (Clopidogrel Bisulfate) .Marland Kitchen.. 1po Qd 8)  Ecotrin Low Strength 81 Mg  Tbec (Aspirin) .Marland Kitchen.. 1po Qd 9)  Nitroquick 0.4 Mg  Subl (Nitroglycerin) .... Use Asd 10)  Pravachol 40 Mg  Tabs (Pravastatin Sodium) .Marland Kitchen.. 1 By Mouth Qd 11)  Klor-Con 20 Meq Pack (Potassium Chloride) .... Take 1 Tablet By Mouth Two Times A Day 12)  Allopurinol 100 Mg Tabs (Allopurinol) .Marland Kitchen.. 1po Once Daily 13)  Onetouch Ultra Test  Strp (Glucose Blood) .... Two Times A Day, and Lancets 250.00 14)  Relion Insulin Syringe 31g X 5/16" 0.5 Ml Misc (Insulin Syringe-Needle U-100) .... Bid 15)  Oxycodone Hcl 5 Mg Tabs (Oxycodone Hcl) .Marland Kitchen.. 1  - 2  Tabs By Mouth Q 6 Hrs As Needed Pain 16)  Losartan Potassium 100 Mg Tabs (Losartan Potassium) .Marland Kitchen.. 1po Once Daily 17)  Methocarbamol 500 Mg Tabs  (Methocarbamol) .Marland Kitchen.. 1 Q6h As Needed Cramps 18)  Fexofenadine Hcl 180 Mg Tabs (Fexofenadine Hcl) .Marland Kitchen.. 1po Once Daily As Needed Allergies 19)  Humulin N 100 Unit/ml Susp (Insulin Isophane Human) .... 50 Units Am and 35 Units Pm  Current Medications (verified): 1)  Aurora Lancet Super Thin 30g  Misc (Lancets) .... Use 1 Stick As Directed Once  A Day 2)  Indomethacin 50 Mg Caps (Indomethacin) .Marland Kitchen.. 1 By Mouth Three Times A Day As Needed Gout 3)  Diclofenac Sodium 75 Mg Tbec (Diclofenac Sodium) .... Take 1 Tablet By Mouth Twice A Day 4)  Furosemide 80 Mg Tabs (Furosemide) .Marland Kitchen.. 1 By Mouth Qam 5)  Onetouch Ultra Test   Strp (Glucose Blood) .... Use Asd 1 Strip Tid 6)  Omeprazole 20 Mg  Cpdr (Omeprazole) .... 2 By Mouth Once Daily 7)  Plavix 75 Mg  Tabs (Clopidogrel Bisulfate) .Marland Kitchen.. 1po Qd 8)  Ecotrin Low Strength 81 Mg  Tbec (Aspirin) .Marland Kitchen.. 1po Qd 9)  Nitroquick 0.4 Mg  Subl (Nitroglycerin) .... Use Asd 10)  Pravachol 40 Mg  Tabs (Pravastatin Sodium) .Marland Kitchen.. 1 By Mouth Qd 11)  Klor-Con 20 Meq Pack (Potassium Chloride) .... Take 1 Tablet By Mouth Two Times A Day 12)  Allopurinol 100 Mg Tabs (Allopurinol) .Marland Kitchen.. 1po Once Daily 13)  Onetouch Ultra Test  Strp (Glucose Blood) .... Two Times A Day, and Lancets 250.00 14)  Relion Insulin Syringe 31g X 5/16" 0.5 Ml Misc (Insulin Syringe-Needle U-100) .... Bid 15)  Oxycodone Hcl 5 Mg Tabs (Oxycodone Hcl) .Marland Kitchen.. 1  - 2  Tabs By Mouth Q 6 Hrs As Needed Pain 16)  Losartan Potassium 100 Mg Tabs (Losartan Potassium) .Marland Kitchen.. 1po Once Daily 17)  Methocarbamol 500 Mg Tabs (Methocarbamol) .Marland Kitchen.. 1 Q6h As Needed Cramps 18)  Fexofenadine Hcl 180 Mg Tabs (Fexofenadine Hcl) .Marland Kitchen.. 1po Once Daily As Needed Allergies 19)  Humulin N 100 Unit/ml Susp (Insulin Isophane Human) .... 55 Units Am and 40 Units Pm  Allergies (verified): 1)  ! Sulfa 2)  ! Pcn 3)  ! Metformin Hcl (Metformin Hcl) 4)  ! Cardizem La (Diltiazem Hcl Coated Beads) 5)  ! Lovastatin (Lovastatin) 6)  ! *  Shellfish  Past History:  Past Medical History: Last updated: 10/30/2009 Hypertension Diabetes mellitus, type II Peripheral vascular disease Diverticulosis, colon Morbid Obesity Gout Depression Osteoarthritis Osteopenia Hyperlipidemia Low back pain/'lumbar disc dz/lumbar spinal stenosis cervical spine disc dz Anxiety GERD Barrett's esophagus gastroparesis hx of h pylori gastritis Coronary artery disease Allergic rhinitis  Past Surgical History: Last updated: 10/30/2009 Cholecystectomy Hysterectomy Tubal ligation Ovarian Cystectomies  s/p stent x 3 - Dr Excell Seltzer  Social History: Last updated: 09/11/2009 Former Smoker Alcohol use-no retired widowed 2004 Drug use-no  Risk Factors: Smoking Status: quit (02/15/2007)  Review of Systems       all otherwise negative per pt -   Physical Exam  General:  alert and overweight-appearing.   Head:  normocephalic and atraumatic.   Eyes:  vision grossly intact, pupils equal, and pupils round.   Ears:  R ear normal and L ear normal.   Nose:  no external deformity and no nasal discharge.   Mouth:  no gingival abnormalities and pharynx pink and moist.   Neck:  supple and no JVD.   Lungs:  normal respiratory effort and normal breath sounds.   Heart:  normal rate and regular rhythm.   Abdomen:  soft, non-tender, and normal bowel sounds.   Msk:  no joint tenderness and no joint swelling.   Extremities:  trace RLE edema only Neurologic:  cranial nerves II-XII intact and strength normal in all extremities.     Impression & Recommendations:  Problem # 1:  SINUS BRADYCARDIA (ICD-427.81)  Her updated medication list for this problem includes:    Plavix 75 Mg Tabs (Clopidogrel bisulfate) .Marland Kitchen... 1po qd    Ecotrin Low Strength 81 Mg Tbec (Aspirin) .Marland Kitchen... 1po qd improved, still in the low 50's; still feels fatigued;  to stay off the beta blocker for now  Problem # 2:  PERIPHERAL EDEMA (ICD-782.3)  Her updated medication list  for this problem includes:    Furosemide 80 Mg Tabs (Furosemide) .Marland Kitchen... 1 by mouth qam ? diast dysfxn vs other;  improved in the past 3 days volume-wise;  ok to reduce the lasix to AM only again, but will need echo, and f/u with dr Excell Seltzer who has seen in the past for cards  Orders: Echo Referral (Echo) Cardiology Referral (Cardiology)  Problem # 3:  HYPERTENSION (ICD-401.9)  Her updated medication list for this problem includes:    Furosemide 80 Mg Tabs (Furosemide) .Marland Kitchen... 1 by mouth qam    Losartan Potassium 100 Mg Tabs (Losartan potassium) .Marland Kitchen... 1po once daily  BP today: 122/62 Prior BP: 130/62 (10/30/2009)  Prior 10 Yr Risk Heart Disease: N/A (09/11/2009)  Labs Reviewed: K+: 4.2 (09/11/2009) Creat: : 0.8 (09/11/2009)   Chol: 152 (09/11/2009)   HDL: 40.00 (09/11/2009)   LDL: 78 (09/02/2008)   TG: 216.0 (09/11/2009) stable overall by hx and exam, ok to continue meds/tx as is - to hold on further meds at this time, cont ARB for now  Problem # 4:  DIABETES MELLITUS, TYPE II (ICD-250.00)  Her updated medication list for this problem includes:    Ecotrin Low Strength 81 Mg Tbec (Aspirin) .Marland Kitchen... 1po qd    Losartan Potassium 100 Mg Tabs (Losartan potassium) .Marland Kitchen... 1po once daily    Humulin N 100 Unit/ml Susp (Insulin isophane human) .Marland KitchenMarland KitchenMarland KitchenMarland Kitchen 55 units am and 40 units pm moderately uncontrolled, despite recent insulin adjustment per DR Everardo All 3 days ago;  dr Everardo All currently on vacation for one wk;  will go ahead and incr the insulin to 55 in th eAM, 40 in the PM  Complete Medication List: 1)  Aurora Lancet Super Thin 30g Misc (Lancets) .... Use 1 stick as directed once  a day 2)  Indomethacin 50 Mg Caps (Indomethacin) .Marland Kitchen.. 1 by mouth three times a day as needed gout 3)  Diclofenac Sodium 75 Mg Tbec (Diclofenac sodium) .... Take 1 tablet by mouth twice a day  4)  Furosemide 80 Mg Tabs (Furosemide) .Marland Kitchen.. 1 by mouth qam 5)  Onetouch Ultra Test Strp (Glucose blood) .... Use asd 1 strip tid 6)   Omeprazole 20 Mg Cpdr (Omeprazole) .... 2 by mouth once daily 7)  Plavix 75 Mg Tabs (Clopidogrel bisulfate) .Marland Kitchen.. 1po qd 8)  Ecotrin Low Strength 81 Mg Tbec (Aspirin) .Marland Kitchen.. 1po qd 9)  Nitroquick 0.4 Mg Subl (Nitroglycerin) .... Use asd 10)  Pravachol 40 Mg Tabs (Pravastatin sodium) .Marland Kitchen.. 1 by mouth qd 11)  Klor-con 20 Meq Pack (Potassium chloride) .... Take 1 tablet by mouth two times a day 12)  Allopurinol 100 Mg Tabs (Allopurinol) .Marland Kitchen.. 1po once daily 13)  Onetouch Ultra Test Strp (Glucose blood) .... Two times a day, and lancets 250.00 14)  Relion Insulin Syringe 31g X 5/16" 0.5 Ml Misc (Insulin syringe-needle u-100) .... Bid 15)  Oxycodone Hcl 5 Mg Tabs (Oxycodone hcl) .Marland Kitchen.. 1  - 2  tabs by mouth q 6 hrs as needed pain 16)  Losartan Potassium 100 Mg Tabs (Losartan potassium) .Marland Kitchen.. 1po once daily 17)  Methocarbamol 500 Mg Tabs (Methocarbamol) .Marland Kitchen.. 1 q6h as needed cramps 18)  Fexofenadine Hcl 180 Mg Tabs (Fexofenadine hcl) .Marland Kitchen.. 1po once daily as needed allergies 19)  Humulin N 100 Unit/ml Susp (Insulin isophane human) .... 55 units am and 40 units pm  Patient Instructions: 1)  please decrease the lasix (furosemide) to the AM dosing only 2)  increase the insulin to 55 units in the AM, 40 in the PM 3)  check your blood sugars as you do, and please call with results by Friday later this wk [phone] 4)  You will be contacted about the referral(s) to: echocardiogram, and Dr Cooper/cardiology 5)  Please schedule a follow-up appointment in 5 months with: 6)  BMP prior to visit, ICD-9: 250.02 7)  Lipid Panel prior to visit, ICD-9: 8)  HbgA1C prior to visit, ICD-9:

## 2010-05-13 NOTE — Progress Notes (Signed)
Summary: unable to provide care -skilled nursing home   Phone Note From Other Clinic   Caller: ann from [facility] [phone] ext [phone] Request: Talk with Nurse Summary of Call: pt at a skilled nursing fac. [facility] is  unable to provide care.  Initial call taken by: Lorne Skeens,  March 24, 2010 9:11 AM  Follow-up for Phone Call        I spoke with Dewayne Hatch and made her aware that we would like [facility] to come into the home once the pt is discharged from skilled nursing.  If the pt is going to be in facility for more than a couple of weeks [facility] would require a new order.  Follow-up by: Julieta Gutting, RN, BSN,  March 24, 2010 9:29 AM

## 2010-05-13 NOTE — Assessment & Plan Note (Signed)
Summary: 3 MTH RETURN---STC   Vital Signs:  Patient profile:   77 year old female Height:      67 inches (170.18 cm) Weight:      238.13 pounds (108.24 kg) O2 Sat:      98 % on Room air Temp:     97.4 degrees F (36.33 degrees C) oral Pulse rate:   60 / minute BP sitting:   140 / 78  (left arm)  Vitals Entered By: Sydell Axon (June 19, 2009 10:36 AM)  O2 Flow:  Room air CC: 3 month F/U   Primary Provider:  Oliver Barre  CC:  3 month F/U.  History of Present Illness: pt says she only takes 55 units of insulin in the am and 20 units pm. she says she takes cozaar and norvasc as rx'ed.  Current Medications (verified): 1)  Aurora Lancet Super Thin 30g  Misc (Lancets) .... Use 1 Stick As Directed Once  A Day 2)  Indomethacin 50 Mg Caps (Indomethacin) .Marland Kitchen.. 1 By Mouth Three Times A Day As Needed Gout 3)  Diclofenac Sodium 75 Mg Tbec (Diclofenac Sodium) .... Take 1 Tablet By Mouth Twice A Day 4)  Furosemide 80 Mg Tabs (Furosemide) .Marland Kitchen.. 1 By Mouth Qd 5)  Onetouch Ultra Test   Strp (Glucose Blood) .... Use Asd 1 Strip Tid 6)  Omeprazole 20 Mg  Cpdr (Omeprazole) .... 2 By Mouth Once Daily 7)  Plavix 75 Mg  Tabs (Clopidogrel Bisulfate) .Marland Kitchen.. 1po Qd 8)  Ecotrin Low Strength 81 Mg  Tbec (Aspirin) .Marland Kitchen.. 1po Qd 9)  Nitroquick 0.4 Mg  Subl (Nitroglycerin) .... Use Asd 10)  Pravachol 40 Mg  Tabs (Pravastatin Sodium) .Marland Kitchen.. 1 By Mouth Qd 11)  Klor-Con 20 Meq Pack (Potassium Chloride) .... Take 1 Tablet By Mouth Two Times A Day 12)  Allopurinol 100 Mg Tabs (Allopurinol) .Marland Kitchen.. 1po Once Daily 13)  Onetouch Ultra Test  Strp (Glucose Blood) .... Two Times A Day, and Lancets 250.00 14)  Novolin 70/30 70-30 % Susp (Insulin Isophane & Regular) .... 50 Units Qam and 35 Units Qpm 15)  Relion Insulin Syringe 31g X 5/16" 0.5 Ml Misc (Insulin Syringe-Needle U-100) .... Bid 16)  Amlodipine Besylate 10 Mg Tabs (Amlodipine Besylate) .Marland Kitchen.. 1po Once Daily 17)  Oxycodone Hcl 5 Mg Tabs (Oxycodone Hcl) .Marland Kitchen.. 1  -   3 Tabs By Mouth Q 6 Hrs As Needed Pain 18)  Losartan Potassium 100 Mg Tabs (Losartan Potassium) .Marland Kitchen.. 1po Once Daily 19)  Methocarbamol 500 Mg Tabs (Methocarbamol) .Marland Kitchen.. 1 Q6h As Needed Cramps 20)  Valacyclovir Hcl 500 Mg Tabs (Valacyclovir Hcl) .... 2 Tabs By Mouth Three Times A Day For 7 Days  Allergies (verified): 1)  ! Sulfa 2)  ! Pcn 3)  ! Metformin Hcl (Metformin Hcl) 4)  ! Cardizem La (Diltiazem Hcl Coated Beads) 5)  ! Lovastatin (Lovastatin) 6)  ! * Shellfish  Past History:  Past Medical History: Last updated: 05/03/2007 Hypertension Diabetes mellitus, type II Peripheral vascular disease Diverticulosis, colon Morbid Obesity Gout Depression Osteoarthritis Osteopenia Hyperlipidemia Low back pain/'lumbar disc dz/lumbar spinal stenosis cervical spine disc dz Anxiety GERD Barrett's esophagus gastroparesis hx of h pylori gastritis Coronary artery disease  Review of Systems  The patient denies hypoglycemia.    Physical Exam  General:  normal appearance.   Neck:  Supple without thyroid enlargement or tenderness.  Extremities:  (sees podiatry) Additional Exam:  Hemoglobin A1C       [H]  9.7 %  Impression & Recommendations:  Problem # 1:  DIABETES MELLITUS, TYPE II (ICD-250.00) needs increased rx  Problem # 2:  HYPERTENSION (ICD-401.9) with ? of situational component  Other Orders: TLB-A1C / Hgb A1C (Glycohemoglobin) (83036-A1C)  Patient Instructions: 1)  check your blood sugar 2 times a day.  vary the time of day when you check, between before the 3 meals, and at bedtime.  also check if you have symptoms of your blood sugar being too high or too low.  please keep a record of the readings and bring it to your next appointment here.  please call us sooner if you are having low blood sugar episodes. 2)  tests are being ordered for you today.  a few days after the test(s), please call [phone] to hear your test results. 3)  pending the test results, please  increase novolin 70/30 to 50 units am and 35 units pm. 4)  return 3 months.

## 2010-05-13 NOTE — Letter (Signed)
Summary: Cardiac Rehab Program  Cardiac Rehab Program   Imported By: Marylou Mccoy 03/12/2010 16:13:15  _____________________________________________________________________  External Attachment:    Type:   Image     Comment:   External Document

## 2010-05-19 NOTE — Assessment & Plan Note (Signed)
Summary: elev sugar/#/cd   Vital Signs:  Patient profile:   77 year old female Height:      67 inches (170.18 cm) Weight:      227.12 pounds (103.24 kg) O2 Sat:      99 % on Room air Temp:     98.4 degrees F (36.89 degrees C) oral Pulse rate:   55 / minute BP sitting:   132 / 60  (left arm) Cuff size:   large  Vitals Entered By: Orlan Leavens RMA (May 05, 2010 8:02 AM)  O2 Flow:  Room air CC: Elevated BS Is Patient Diabetic? Yes Did you bring your meter with you today? Yes Pain Assessment Patient in pain? no      CBG Result 194 CBG Device ID pt check this am at home   Primary Provider:  Oliver Barre  CC:  Elevated BS.  History of Present Illness: pt takes nph insulin 35 units two times a day.  she sometimes takes as much as 45 units, if cbg is high.  pt states she feels well in general.   Current Medications (verified): 1)  Aurora Lancet Super Thin 30g  Misc (Lancets) .... Use 1 Stick As Directed Once  A Day 2)  Furosemide 80 Mg Tabs (Furosemide) .Marland Kitchen.. 1 By Mouth Qam 3)  Onetouch Ultra Test   Strp (Glucose Blood) .... Use Asd 1 Strip Tid 4)  Omeprazole 20 Mg  Cpdr (Omeprazole) .... 2 By Mouth Once Daily 5)  Aspirin Ec 325 Mg Tbec (Aspirin) .... Take One Tablet By Mouth Daily 6)  Nitroquick 0.4 Mg  Subl (Nitroglycerin) .... Use Asd 7)  Pravachol 40 Mg  Tabs (Pravastatin Sodium) .Marland Kitchen.. 1 By Mouth Qd 8)  Potassium Chloride Crys Cr 20 Meq Cr-Tabs (Potassium Chloride Crys Cr) .... Take One Tablet By Mouth Twice A Day 9)  Allopurinol 100 Mg Tabs (Allopurinol) .Marland Kitchen.. 1po Once Daily 10)  Onetouch Ultra Test  Strp (Glucose Blood) .... Two Times A Day, and Lancets 250.00 11)  Oxycodone Hcl 5 Mg Tabs (Oxycodone Hcl) .Marland Kitchen.. 1  - 2  Tabs By Mouth Q 6 Hrs As Needed Pain 12)  Methocarbamol 500 Mg Tabs (Methocarbamol) .Marland Kitchen.. 1 Q6h As Needed Cramps 13)  Fexofenadine Hcl 180 Mg Tabs (Fexofenadine Hcl) .Marland Kitchen.. 1po Once Daily As Needed Allergies 14)  Humulin N 100 Unit/ml Susp (Insulin Isophane  Human) .... 30 Units Am and 25 Units Pm 15)  Amlodipine Besylate 10 Mg Tabs (Amlodipine Besylate) .... Take One Tablet By Mouth Daily 16)  Losartan Potassium 100 Mg Tabs (Losartan Potassium) .... Take 1 Tablet By Mouth Once A Day 17)  Polysaccharide Iron Complex 150 Mg Caps (Polysaccharide Iron Complex) .... Take 1 Capsule By Mouth Once A Day 18)  Synthroid 100 Mcg Tabs (Levothyroxine Sodium) .... Take 1 Tablet By Mouth Once A Day 19)  Vitamin D2 400 Unit Tabs (Ergocalciferol) .... Take 1 Tablet By Mouth Once A Day  Allergies (verified): 1)  ! Sulfa 2)  ! Pcn 3)  ! Metformin Hcl (Metformin Hcl) 4)  ! Cardizem La (Diltiazem Hcl Coated Beads) 5)  ! Lovastatin (Lovastatin) 6)  ! * Shellfish  Past History:  Past Medical History: Last updated: 04/02/2010 Multivessel coronary artery disease s/p PCI with DES 2008 and CABG 2011. Hypertension Diabetes mellitus, type II Peripheral vascular disease Diverticulosis, colon Morbid Obesity Gout Depression Osteoarthritis Osteopenia Hyperlipidemia Low back pain/'lumbar disc dz/lumbar spinal stenosis cervical spine disc dz thrombocytopenia hx of Anxiety GERD Barrett's esophagus gastroparesis hx  of h pylori gastritis Coronary artery disease Allergic rhinitis Anemia-NOS  Review of Systems  The patient denies hypoglycemia.    Physical Exam  General:  obese.  no distress. Psych:  Alert and cooperative; normal mood and affect; normal attention span and concentration.     Impression & Recommendations:  Problem # 1:  DIABETES MELLITUS, TYPE II (ICD-250.00) needs increased rx  Medications Added to Medication List This Visit: 1)  Humulin N 100 Unit/ml Susp (Insulin isophane human) .... 55 units each morning, and 45 units in the evening  Other Orders: Est. Patient Level III (82956)  Patient Instructions: 1)  increase nph insulin to 55 units am and 45 units pm.   2)  check your blood sugar 2 times a day.  vary the time of day when  you check, between before the 3 meals, and at bedtime.  also check if you have symptoms of your blood sugar being too high or too low.  please keep a record of the readings and bring it to your next appointment here.  please call us sooner if you are having low blood sugar episodes. 3)  please see dr Jonny Ruiz for your symptoms. 4)  Please schedule a follow-up appointment in 1 month. Prescriptions: SYNTHROID 100 MCG TABS (LEVOTHYROXINE SODIUM) Take 1 tablet by mouth once a day  #30 x 4   Entered and Authorized by:   Minus Breeding MD   Signed by:   Minus Breeding MD on 05/05/2010   Method used:   Electronically to        [facility] (retail)       [address]       [place], [place]  [address]       Ph: [phone]       Fax: [phone]   RxID:   [id number]    Orders Added: 1)  Est. Patient Level III [25956]

## 2010-05-19 NOTE — Assessment & Plan Note (Signed)
Summary: BURNING URINE  STC   Vital Signs:  Patient profile:   77 year old female Height:      67 inches Weight:      223.75 pounds BMI:     35.17 O2 Sat:      97 % on Room air Temp:     98.4 degrees F oral Pulse rate:   56 / minute BP sitting:   126 / 70  (left arm) Cuff size:   large  Vitals Entered By: Zella Ball Ewing CMA (AAMA) (May 11, 2010 1:59 PM)  O2 Flow:  Room air CC: Burning when urinating/RE   Primary Care Provider:  Oliver Barre  CC:  Burning when urinating/RE.  History of Present Illness: here to f/u with c/o 1-2 days GU area itch and dysuria, without freq and urgency, hematuria, worsening back pain, n/v, fever , chills, or abd pain. Last UTI was last yr, and denies worsening urinary retention, incontinence.     Has lost some wt with better diet since last seen;  Pt denies CP, worsening sob, doe, wheezing, orthopnea, pnd, worsening LE edema, palps, dizziness or syncope  Pt denies new neuro symptoms such as headache, facial or extremity weakness  Pt denies polydipsia, polyuria  Overall good compliance with meds, trying to follow low chol diet, wt stable, little excercise however CBG's in the mid to lower 100's.   Overall good compliance with meds, and good tolerability.  No high fever, wt loss, night sweats, loss of appetite or other constitutional symptoms  Also c/o diffuse pruritis for several days despite moisturizer as needed;  no rash ,just cant stop scratching and itching.  No recnet change in soap, detergent or other chemical at home.    Problems Prior to Update: 1)  Pruritus  (ICD-698.9) 2)  Dysuria  (ICD-788.1) 3)  Sports Physical  (ICD-V70.3) 4)  Leg Pain  (ICD-729.5) 5)  Anemia-nos  (ICD-285.9) 6)  Diastolic Heart Failure, Acute  (ICD-428.31) 7)  Acute On Chronic Diastolic Heart Failure  (ICD-428.33) 8)  Sinus Bradycardia  (ICD-427.81) 9)  Back Pain  (ICD-724.5) 10)  Allergic Rhinitis  (ICD-477.9) 11)  Muscle Strain, Right Buttock  (ICD-848.8) 12)   Shingles  (ICD-053.9) 13)  Shoulder Pain, Left  (ICD-719.41) 14)  Proteinuria  (ICD-791.0) 15)  Preventive Health Care  (ICD-V70.0) 16)  Abdominal Pain, Unspecified Site  (ICD-789.00) 17)  Finger Pain  (ICD-729.5) 18)  Coronary Artery Disease  (ICD-414.00) 19)  Chest Pain  (ICD-786.50) 20)  Dizziness  (ICD-780.4) 21)  Peripheral Edema  (ICD-782.3) 22)  Family History of Cad Female 1st Degree Relative <50  (ICD-V17.3) 23)  Spinal Stenosis, Lumbar  (ICD-724.02) 24)  Disc Disease, Cervical  (ICD-722.4) 25)  Helicobacter Pylori Gastritis, Hx of  (ICD-V12.79) 26)  Gastroparesis  (ICD-536.3) 27)  Barrett's Esophagus, Hx of  (ICD-V12.79) 28)  Gerd  (ICD-530.81) 29)  Disc Disease, Lumbar  (ICD-722.52) 30)  Anxiety  (ICD-300.00) 31)  Low Back Pain  (ICD-724.2) 32)  Hyperlipidemia  (ICD-272.4) 33)  Osteopenia  (ICD-733.90) 34)  Osteoarthritis  (ICD-715.90) 35)  Depression  (ICD-311) 36)  Gout  (ICD-274.9) 37)  Morbid Obesity  (ICD-278.01) 38)  Diverticulosis, Colon  (ICD-562.10) 39)  Peripheral Vascular Disease  (ICD-443.9) 40)  Diabetes Mellitus, Type II  (ICD-250.00) 41)  Hypertension  (ICD-401.9)  Medications Prior to Update: 1)  Aurora Lancet Super Thin 30g  Misc (Lancets) .... Use 1 Stick As Directed Once  A Day 2)  Furosemide 80 Mg Tabs (Furosemide) .Marland Kitchen.. 1 By  Mouth Qam 3)  Onetouch Ultra Test   Strp (Glucose Blood) .... Use Asd 1 Strip Tid 4)  Omeprazole 20 Mg  Cpdr (Omeprazole) .... 2 By Mouth Once Daily 5)  Aspirin Ec 325 Mg Tbec (Aspirin) .... Take One Tablet By Mouth Daily 6)  Nitroquick 0.4 Mg  Subl (Nitroglycerin) .... Use Asd 7)  Pravachol 40 Mg  Tabs (Pravastatin Sodium) .Marland Kitchen.. 1 By Mouth Qd 8)  Potassium Chloride Crys Cr 20 Meq Cr-Tabs (Potassium Chloride Crys Cr) .... Take One Tablet By Mouth Twice A Day 9)  Allopurinol 100 Mg Tabs (Allopurinol) .Marland Kitchen.. 1po Once Daily 10)  Onetouch Ultra Test  Strp (Glucose Blood) .... Two Times A Day, and Lancets 250.00 11)  Oxycodone Hcl 5  Mg Tabs (Oxycodone Hcl) .Marland Kitchen.. 1  - 2  Tabs By Mouth Q 6 Hrs As Needed Pain 12)  Methocarbamol 500 Mg Tabs (Methocarbamol) .Marland Kitchen.. 1 Q6h As Needed Cramps 13)  Fexofenadine Hcl 180 Mg Tabs (Fexofenadine Hcl) .Marland Kitchen.. 1po Once Daily As Needed Allergies 14)  Humulin N 100 Unit/ml Susp (Insulin Isophane Human) .... 55 Units Each Morning, and 45 Units in The Evening 15)  Amlodipine Besylate 10 Mg Tabs (Amlodipine Besylate) .... Take One Tablet By Mouth Daily 16)  Losartan Potassium 100 Mg Tabs (Losartan Potassium) .... Take 1 Tablet By Mouth Once A Day 17)  Polysaccharide Iron Complex 150 Mg Caps (Polysaccharide Iron Complex) .... Take 1 Capsule By Mouth Once A Day 18)  Synthroid 100 Mcg Tabs (Levothyroxine Sodium) .... Take 1 Tablet By Mouth Once A Day 19)  Vitamin D2 400 Unit Tabs (Ergocalciferol) .... Take 1 Tablet By Mouth Once A Day  Current Medications (verified): 1)  Aurora Lancet Super Thin 30g  Misc (Lancets) .... Use 1 Stick As Directed Once  A Day 2)  Furosemide 80 Mg Tabs (Furosemide) .Marland Kitchen.. 1 By Mouth Qam 3)  Onetouch Ultra Test   Strp (Glucose Blood) .... Use Asd 1 Strip Tid 4)  Omeprazole 20 Mg  Cpdr (Omeprazole) .... 2 By Mouth Once Daily 5)  Aspirin Ec 325 Mg Tbec (Aspirin) .... Take One Tablet By Mouth Daily 6)  Nitroquick 0.4 Mg  Subl (Nitroglycerin) .... Use Asd 7)  Pravachol 40 Mg  Tabs (Pravastatin Sodium) .Marland Kitchen.. 1 By Mouth Qd 8)  Potassium Chloride Crys Cr 20 Meq Cr-Tabs (Potassium Chloride Crys Cr) .... Take One Tablet By Mouth Twice A Day 9)  Allopurinol 100 Mg Tabs (Allopurinol) .Marland Kitchen.. 1po Once Daily 10)  Onetouch Ultra Test  Strp (Glucose Blood) .... Two Times A Day, and Lancets 250.00 11)  Oxycodone Hcl 5 Mg Tabs (Oxycodone Hcl) .Marland Kitchen.. 1  - 2  Tabs By Mouth Q 6 Hrs As Needed Pain 12)  Methocarbamol 500 Mg Tabs (Methocarbamol) .Marland Kitchen.. 1 Q6h As Needed Cramps 13)  Fexofenadine Hcl 180 Mg Tabs (Fexofenadine Hcl) .Marland Kitchen.. 1po Once Daily As Needed Allergies 14)  Humulin N 100 Unit/ml Susp (Insulin  Isophane Human) .... 55 Units Each Morning, and 45 Units in The Evening 15)  Amlodipine Besylate 10 Mg Tabs (Amlodipine Besylate) .... Take One Tablet By Mouth Daily 16)  Losartan Potassium 100 Mg Tabs (Losartan Potassium) .... Take 1 Tablet By Mouth Once A Day 17)  Polysaccharide Iron Complex 150 Mg Caps (Polysaccharide Iron Complex) .... Take 1 Capsule By Mouth Once A Day 18)  Synthroid 100 Mcg Tabs (Levothyroxine Sodium) .... Take 1 Tablet By Mouth Once A Day 19)  Vitamin D2 400 Unit Tabs (Ergocalciferol) .... Take 1 Tablet By  Mouth Once A Day 20)  Ciprofloxacin Hcl 500 Mg Tabs (Ciprofloxacin Hcl) .Marland Kitchen.. 1po Two Times A Day  Allergies (verified): 1)  ! Sulfa 2)  ! Pcn 3)  ! Metformin Hcl (Metformin Hcl) 4)  ! Cardizem La (Diltiazem Hcl Coated Beads) 5)  ! Lovastatin (Lovastatin) 6)  ! * Shellfish  Past History:  Past Medical History: Last updated: 04/02/2010 Multivessel coronary artery disease s/p PCI with DES 2008 and CABG 2011. Hypertension Diabetes mellitus, type II Peripheral vascular disease Diverticulosis, colon Morbid Obesity Gout Depression Osteoarthritis Osteopenia Hyperlipidemia Low back pain/'lumbar disc dz/lumbar spinal stenosis cervical spine disc dz thrombocytopenia hx of Anxiety GERD Barrett's esophagus gastroparesis hx of h pylori gastritis Coronary artery disease Allergic rhinitis Anemia-NOS  Past Surgical History: Last updated: 11/20/2009  drug-eluting stent to the left anterior descending,    circumflex and right coronary artery in January 2009.  Cholecystectomy Hysterectomy Tubal ligation Ovarian Cystectomies s/p stent x 3 - Dr Excell Seltzer  Social History: Last updated: 11/20/2009 Former Smoker Alcohol use-no retired widowed 2004 Drug use-no  Risk Factors: Smoking Status: quit (02/15/2007)  Review of Systems       all otherwise negative per pt -    Physical Exam  General:  alert and overweight-appearing.   Head:  normocephalic and  atraumatic.   Eyes:  vision grossly intact, pupils equal, and pupils round.   Ears:  R ear normal and L ear normal.   Nose:  no external deformity and no nasal discharge.   Mouth:  no gingival abnormalities and pharynx pink and moist.   Neck:  supple and no masses.   Lungs:  normal respiratory effort and normal breath sounds.   Heart:  normal rate and regular rhythm.   Abdomen:  soft and normal bowel sounds.  with mild low abd tender, no guarding or rebound Extremities:  no edema, no erythema  Skin:  color normal and no rashes.   Psych:  not depressed appearing and slightly anxious.     Impression & Recommendations:  Problem # 1:  DYSURIA (ICD-788.1)  Orders: UA Dipstick W/ Micro (manual) ([address]) T-Culture, Urine (60454-09811)  ? uti, seems likely despite neg udip - for cipro course and urine cx  Her updated medication list for this problem includes:    Ciprofloxacin Hcl 500 Mg Tabs (Ciprofloxacin hcl) .Marland Kitchen... 1po two times a day  Problem # 2:  PRURITUS (ICD-698.9) ongoing for several days despite skin moisterurizer; no rash - will try allegra as needed   Problem # 3:  HYPERTENSION (ICD-401.9)  Her updated medication list for this problem includes:    Furosemide 80 Mg Tabs (Furosemide) .Marland Kitchen... 1 by mouth qam    Amlodipine Besylate 10 Mg Tabs (Amlodipine besylate) .Marland Kitchen... Take one tablet by mouth daily    Losartan Potassium 100 Mg Tabs (Losartan potassium) .Marland Kitchen... Take 1 tablet by mouth once a day  BP today: 126/70 Prior BP: 132/60 (05/05/2010)  Prior 10 Yr Risk Heart Disease: N/A (09/11/2009)  Labs Reviewed: K+: 3.8 (04/02/2010) Creat: : 1.0 (04/02/2010)   Chol: 109 (04/02/2010)   HDL: 35.00 (04/02/2010)   LDL: 50 (04/02/2010)   TG: 118.0 (04/02/2010) stable overall by hx and exam, ok to continue meds/tx as is   Problem # 4:  DIABETES MELLITUS, TYPE II (ICD-250.00)  Her updated medication list for this problem includes:    Aspirin Ec 325 Mg Tbec (Aspirin) .Marland Kitchen... Take one  tablet by mouth daily    Humulin N 100 Unit/ml Susp (Insulin isophane human) .Marland KitchenMarland KitchenMarland KitchenMarland Kitchen  55 units each morning, and 45 units in the evening    Losartan Potassium 100 Mg Tabs (Losartan potassium) .Marland Kitchen... Take 1 tablet by mouth once a day pt has samples of levemir she has been tkaing for 5 days simlilar to above - will try to ascertain if this is really what Dr Everardo All meant, o/w Continue all previous medications as before this visit   Labs Reviewed: Creat: 1.0 (04/02/2010)    Reviewed HgBA1c results: 6.8 (04/02/2010)  11.1 (12/11/2009)  Complete Medication List: 1)  Aurora Lancet Super Thin 30g Misc (Lancets) .... Use 1 stick as directed once  a day 2)  Furosemide 80 Mg Tabs (Furosemide) .Marland Kitchen.. 1 by mouth qam 3)  Onetouch Ultra Test Strp (Glucose blood) .... Use asd 1 strip tid 4)  Omeprazole 20 Mg Cpdr (Omeprazole) .... 2 by mouth once daily 5)  Aspirin Ec 325 Mg Tbec (Aspirin) .... Take one tablet by mouth daily 6)  Nitroquick 0.4 Mg Subl (Nitroglycerin) .... Use asd 7)  Pravachol 40 Mg Tabs (Pravastatin sodium) .Marland Kitchen.. 1 by mouth qd 8)  Potassium Chloride Crys Cr 20 Meq Cr-tabs (Potassium chloride crys cr) .... Take one tablet by mouth twice a day 9)  Allopurinol 100 Mg Tabs (Allopurinol) .Marland Kitchen.. 1po once daily 10)  Onetouch Ultra Test Strp (Glucose blood) .... Two times a day, and lancets 250.00 11)  Oxycodone Hcl 5 Mg Tabs (Oxycodone hcl) .Marland Kitchen.. 1  - 2  tabs by mouth q 6 hrs as needed pain 12)  Methocarbamol 500 Mg Tabs (Methocarbamol) .Marland Kitchen.. 1 q6h as needed cramps 13)  Fexofenadine Hcl 180 Mg Tabs (Fexofenadine hcl) .Marland Kitchen.. 1po once daily as needed allergies 14)  Humulin N 100 Unit/ml Susp (Insulin isophane human) .... 55 units each morning, and 45 units in the evening 15)  Amlodipine Besylate 10 Mg Tabs (Amlodipine besylate) .... Take one tablet by mouth daily 16)  Losartan Potassium 100 Mg Tabs (Losartan potassium) .... Take 1 tablet by mouth once a day 17)  Polysaccharide Iron Complex 150 Mg Caps  (Polysaccharide iron complex) .... Take 1 capsule by mouth once a day 18)  Synthroid 100 Mcg Tabs (Levothyroxine sodium) .... Take 1 tablet by mouth once a day 19)  Vitamin D2 400 Unit Tabs (Ergocalciferol) .... Take 1 tablet by mouth once a day 20)  Ciprofloxacin Hcl 500 Mg Tabs (Ciprofloxacin hcl) .Marland Kitchen.. 1po two times a day  Patient Instructions: 1)  Please take all new medications as prescribed - the allegra for itching, and the antibiotic (both sent to [facility]) 2)  robin wil try to find out if Dr Everardo All meant to give you the levemir samples to use 3)  Continue all previous medications as before this visit  4)  your urine will be sent for culture 5)  Please schedule a follow-up appointment in 5 months for CPX with labs and: 6)  HbgA1C prior to visit, ICD-9: 250.02 7)  Urine Microalbumin prior to visit, ICD-9: Prescriptions: FEXOFENADINE HCL 180 MG TABS (FEXOFENADINE HCL) 1po once daily as needed allergies  #30 x 11   Entered and Authorized by:   Corwin Levins MD   Signed by:   Corwin Levins MD on 05/11/2010   Method used:   Electronically to        [facility] (retail)       [address]       [place], [place]  [address]  Ph: [phone]       Fax: [phone]   RxID:   [id number] CIPROFLOXACIN HCL 500 MG TABS (CIPROFLOXACIN HCL) 1po two times a day  #14 x 0   Entered and Authorized by:   Corwin Levins MD   Signed by:   Corwin Levins MD on 05/11/2010   Method used:   Electronically to        [facility] (retail)       [address]       [place], [place]  [address]       Ph: [phone]       Fax: [phone]   RxID:   [id number]    Orders Added: 1)  UA Dipstick W/ Micro (manual) [81000] 2)  T-Culture, Urine [[id number]] 3)  Est. Patient Level IV [28315]    Laboratory Results   Urine Tests    Routine Urinalysis   Color: lt. yellow Appearance: Clear Glucose: negative   (Normal Range:  Negative) Bilirubin: negative   (Normal Range: Negative) Ketone: negative   (Normal Range: Negative) Spec. Gravity: 1.010   (Normal Range: 1.003-1.035) Blood: negative   (Normal Range: Negative) pH: 6.5   (Normal Range: 5.0-8.0) Protein: negative   (Normal Range: Negative) Urobilinogen: 0.2   (Normal Range: 0-1) Nitrite: negative   (Normal Range: Negative) Leukocyte Esterace: negative   (Normal Range: Negative)

## 2010-06-01 ENCOUNTER — Telehealth: Payer: Self-pay | Admitting: Internal Medicine

## 2010-06-02 NOTE — Miscellaneous (Signed)
Summary: Home Health Certification/Care Plan   Home Health Certification/Care Plan   Imported By: Roderic Ovens 05/26/2010 16:19:04  _____________________________________________________________________  External Attachment:    Type:   Image     Comment:   External Document

## 2010-06-04 ENCOUNTER — Encounter: Payer: Self-pay | Admitting: Internal Medicine

## 2010-06-04 ENCOUNTER — Ambulatory Visit (INDEPENDENT_AMBULATORY_CARE_PROVIDER_SITE_OTHER): Payer: Medicare Other | Admitting: Internal Medicine

## 2010-06-04 ENCOUNTER — Ambulatory Visit: Payer: Self-pay | Admitting: Endocrinology

## 2010-06-04 DIAGNOSIS — I10 Essential (primary) hypertension: Secondary | ICD-10-CM

## 2010-06-04 DIAGNOSIS — R490 Dysphonia: Secondary | ICD-10-CM

## 2010-06-04 DIAGNOSIS — E119 Type 2 diabetes mellitus without complications: Secondary | ICD-10-CM

## 2010-06-04 DIAGNOSIS — F329 Major depressive disorder, single episode, unspecified: Secondary | ICD-10-CM

## 2010-06-07 ENCOUNTER — Encounter: Payer: Self-pay | Admitting: Endocrinology

## 2010-06-08 NOTE — Assessment & Plan Note (Signed)
Summary: BS IS 325 /NWS   Vital Signs:  Patient profile:   77 year old female Height:      67 inches Weight:      227.13 pounds BMI:     35.70 O2 Sat:      99 % on Room air Temp:     98.3 degrees F oral Pulse rate:   56 / minute BP sitting:   110 / 62  (left arm) Cuff size:   large  Vitals Entered By: Zella Ball Ewing CMA (AAMA) (June 04, 2010 2:13 PM)  O2 Flow:  Room air CC: BS Elevated/RE CBG Result 198   Primary Care Provider:  Oliver Barre  CC:  BS Elevated/RE.  History of Present Illness: here to f/u with me as Dr Everardo All not avialable today;  sugars have been somewhat labile but overall elevated since her surgury- (nov 4 CABG);  most recently on humulin N - 55 in the am, 45 in the PM;  checks her sugars three times a day  with persistent sugars in the 200's for the most part, mildly higher in the 300's in the PM, no low sugars < 150 and no symptoms;  Pt denies CP, worsening sob, doe, wheezing, orthopnea, pnd, worsening LE edema, palps, dizziness or syncope  Pt denies new neuro symptoms such as headache, facial or extremity weakness Pt denies polydipsia, polyuria   Overall good compliance with meds, trying to follow low chol, DM diet, wt stable, little excercise however.  No fever, wt loss, night sweats, loss of appetite or other constitutional symptoms  Overall good compliance with meds, and good tolerability.  Denies worsening  suicidal ideation, or panic but has had worsening mild depressive symtpoms in the past mo with fatigue, low mood, anhedoneia, sleeping more.    Problems Prior to Update: 1)  Hoarseness  (ICD-784.42) 2)  Pruritus  (ICD-698.9) 3)  Dysuria  (ICD-788.1) 4)  Leg Pain  (ICD-729.5) 5)  Anemia-nos  (ICD-285.9) 6)  Diastolic Heart Failure, Acute  (ICD-428.31) 7)  Acute On Chronic Diastolic Heart Failure  (ICD-428.33) 8)  Sinus Bradycardia  (ICD-427.81) 9)  Back Pain  (ICD-724.5) 10)  Allergic Rhinitis  (ICD-477.9) 11)  Muscle Strain, Right Buttock   (ICD-848.8) 12)  Shingles  (ICD-053.9) 13)  Shoulder Pain, Left  (ICD-719.41) 14)  Proteinuria  (ICD-791.0) 15)  Preventive Health Care  (ICD-V70.0) 16)  Abdominal Pain, Unspecified Site  (ICD-789.00) 17)  Finger Pain  (ICD-729.5) 18)  Coronary Artery Disease  (ICD-414.00) 19)  Chest Pain  (ICD-786.50) 20)  Dizziness  (ICD-780.4) 21)  Peripheral Edema  (ICD-782.3) 22)  Family History of Cad Female 1st Degree Relative <50  (ICD-V17.3) 23)  Spinal Stenosis, Lumbar  (ICD-724.02) 24)  Disc Disease, Cervical  (ICD-722.4) 25)  Helicobacter Pylori Gastritis, Hx of  (ICD-V12.79) 26)  Gastroparesis  (ICD-536.3) 27)  Barrett's Esophagus, Hx of  (ICD-V12.79) 28)  Gerd  (ICD-530.81) 29)  Disc Disease, Lumbar  (ICD-722.52) 30)  Anxiety  (ICD-300.00) 31)  Low Back Pain  (ICD-724.2) 32)  Hyperlipidemia  (ICD-272.4) 33)  Osteopenia  (ICD-733.90) 34)  Osteoarthritis  (ICD-715.90) 35)  Depression  (ICD-311) 36)  Gout  (ICD-274.9) 37)  Morbid Obesity  (ICD-278.01) 38)  Diverticulosis, Colon  (ICD-562.10) 39)  Peripheral Vascular Disease  (ICD-443.9) 40)  Diabetes Mellitus, Type II  (ICD-250.00) 41)  Hypertension  (ICD-401.9)  Medications Prior to Update: 1)  Aurora Lancet Super Thin 30g  Misc (Lancets) .... Use 1 Stick As Directed Once  A Day 2)  Furosemide 80 Mg Tabs (Furosemide) .Marland Kitchen.. 1 By Mouth Qam 3)  Onetouch Ultra Test   Strp (Glucose Blood) .... Use Asd 1 Strip Tid 4)  Omeprazole 20 Mg  Cpdr (Omeprazole) .... 2 By Mouth Once Daily 5)  Aspirin Ec 325 Mg Tbec (Aspirin) .... Take One Tablet By Mouth Daily 6)  Nitroquick 0.4 Mg  Subl (Nitroglycerin) .... Use Asd 7)  Pravachol 40 Mg  Tabs (Pravastatin Sodium) .Marland Kitchen.. 1 By Mouth Qd 8)  Potassium Chloride Crys Cr 20 Meq Cr-Tabs (Potassium Chloride Crys Cr) .... Take One Tablet By Mouth Twice A Day 9)  Allopurinol 100 Mg Tabs (Allopurinol) .Marland Kitchen.. 1po Once Daily 10)  Onetouch Ultra Test  Strp (Glucose Blood) .... Two Times A Day, and Lancets 250.00 11)   Oxycodone Hcl 5 Mg Tabs (Oxycodone Hcl) .Marland Kitchen.. 1  - 2  Tabs By Mouth Q 6 Hrs As Needed Pain 12)  Methocarbamol 500 Mg Tabs (Methocarbamol) .Marland Kitchen.. 1 Q6h As Needed Cramps 13)  Fexofenadine Hcl 180 Mg Tabs (Fexofenadine Hcl) .Marland Kitchen.. 1po Once Daily As Needed Allergies 14)  Humulin N 100 Unit/ml Susp (Insulin Isophane Human) .... 55 Units Each Morning, and 45 Units in The Evening 15)  Amlodipine Besylate 10 Mg Tabs (Amlodipine Besylate) .... Take One Tablet By Mouth Daily 16)  Losartan Potassium 100 Mg Tabs (Losartan Potassium) .... Take 1 Tablet By Mouth Once A Day 17)  Polysaccharide Iron Complex 150 Mg Caps (Polysaccharide Iron Complex) .... Take 1 Capsule By Mouth Once A Day 18)  Synthroid 100 Mcg Tabs (Levothyroxine Sodium) .... Take 1 Tablet By Mouth Once A Day 19)  Vitamin D2 400 Unit Tabs (Ergocalciferol) .... Take 1 Tablet By Mouth Once A Day 20)  Ciprofloxacin Hcl 500 Mg Tabs (Ciprofloxacin Hcl) .Marland Kitchen.. 1po Two Times A Day  Current Medications (verified): 1)  Aurora Lancet Super Thin 30g  Misc (Lancets) .... Use 1 Stick As Directed Once  A Day 2)  Furosemide 80 Mg Tabs (Furosemide) .Marland Kitchen.. 1 By Mouth Qam 3)  Onetouch Ultra Test   Strp (Glucose Blood) .... Use Asd 1 Strip Tid 4)  Omeprazole 20 Mg  Cpdr (Omeprazole) .... 2 By Mouth Once Daily 5)  Aspirin Ec 325 Mg Tbec (Aspirin) .... Take One Tablet By Mouth Daily 6)  Nitroquick 0.4 Mg  Subl (Nitroglycerin) .... Use Asd 7)  Pravachol 40 Mg  Tabs (Pravastatin Sodium) .Marland Kitchen.. 1 By Mouth Qd 8)  Potassium Chloride Crys Cr 20 Meq Cr-Tabs (Potassium Chloride Crys Cr) .... Take One Tablet By Mouth Twice A Day 9)  Allopurinol 100 Mg Tabs (Allopurinol) .Marland Kitchen.. 1po Once Daily 10)  Onetouch Ultra Test  Strp (Glucose Blood) .... Two Times A Day, and Lancets 250.00 11)  Oxycodone Hcl 5 Mg Tabs (Oxycodone Hcl) .Marland Kitchen.. 1  - 2  Tabs By Mouth Q 6 Hrs As Needed Pain 12)  Methocarbamol 500 Mg Tabs (Methocarbamol) .Marland Kitchen.. 1 Q6h As Needed Cramps 13)  Levocetirizine Dihydrochloride 5  Mg Tabs (Levocetirizine Dihydrochloride) .Marland Kitchen.. 1 By Mouth Once Daily As Needed Allergies 14)  Humulin N 100 Unit/ml Susp (Insulin Isophane Human) .... 65 Units Each Morning, and 50 Units in The Evening 15)  Amlodipine Besylate 10 Mg Tabs (Amlodipine Besylate) .... Take One Tablet By Mouth Daily 16)  Losartan Potassium 100 Mg Tabs (Losartan Potassium) .... Take 1 Tablet By Mouth Once A Day 17)  Polysaccharide Iron Complex 150 Mg Caps (Polysaccharide Iron Complex) .... Take 1 Capsule By Mouth Once A Day 18)  Synthroid 100 Mcg  Tabs (Levothyroxine Sodium) .... Take 1 Tablet By Mouth Once A Day 19)  Vitamin D2 400 Unit Tabs (Ergocalciferol) .... Take 1 Tablet By Mouth Once A Day 20)  Diclofenac Sodium 50 Mg Tbec (Diclofenac Sodium) .Marland Kitchen.. 1po Two Times A Day As Needed Pain 21)  Citalopram Hydrobromide 10 Mg Tabs (Citalopram Hydrobromide) .Marland Kitchen.. 1po Once Daily  Allergies (verified): 1)  ! Sulfa 2)  ! Pcn 3)  ! Metformin Hcl (Metformin Hcl) 4)  ! Cardizem La (Diltiazem Hcl Coated Beads) 5)  ! Lovastatin (Lovastatin) 6)  ! * Shellfish  Past History:  Past Medical History: Last updated: 04/02/2010 Multivessel coronary artery disease s/p PCI with DES 2008 and CABG 2011. Hypertension Diabetes mellitus, type II Peripheral vascular disease Diverticulosis, colon Morbid Obesity Gout Depression Osteoarthritis Osteopenia Hyperlipidemia Low back pain/'lumbar disc dz/lumbar spinal stenosis cervical spine disc dz thrombocytopenia hx of Anxiety GERD Barrett's esophagus gastroparesis hx of h pylori gastritis Coronary artery disease Allergic rhinitis Anemia-NOS  Past Surgical History: Last updated: 11/20/2009  drug-eluting stent to the left anterior descending,    circumflex and right coronary artery in January 2009.  Cholecystectomy Hysterectomy Tubal ligation Ovarian Cystectomies s/p stent x 3 - Dr Excell Seltzer  Social History: Last updated: 11/20/2009 Former Smoker Alcohol  use-no retired widowed 2004 Drug use-no  Risk Factors: Smoking Status: quit (02/15/2007)  Review of Systems       all otherwise negative per pt -  excpet has had mild intermittnet hoarseness for several months with mild nasal allergy symtpoms and good complaicne with antacid  Physical Exam  General:  alert and overweight-appearing.   Head:  normocephalic and atraumatic.   Eyes:  vision grossly intact, pupils equal, and pupils round.   Ears:  R ear normal and L ear normal.   Nose:  no external deformity and no nasal discharge.   Mouth:  no gingival abnormalities and pharynx pink and moist.   Neck:  supple and no masses.   Lungs:  normal respiratory effort and normal breath sounds.   Heart:  normal rate and regular rhythm.   Abdomen:  soft and normal bowel sounds.non-tender.   Extremities:  no edema, no erythema  Skin:  color normal and no rashes.   Psych:  depressed affect and slightly anxious.     Impression & Recommendations:  Problem # 1:  DIABETES MELLITUS, TYPE II (ICD-250.00)  Her updated medication list for this problem includes:    Aspirin Ec 325 Mg Tbec (Aspirin) .Marland Kitchen... Take one tablet by mouth daily    Humulin N 100 Unit/ml Susp (Insulin isophane human) .Marland KitchenMarland KitchenMarland KitchenMarland Kitchen 65 units each morning, and 50 units in the evening    Losartan Potassium 100 Mg Tabs (Losartan potassium) .Marland Kitchen... Take 1 tablet by mouth once a day  Orders: Glucose, (CBG) (18299) uncontrolled - to increase Humulin N  to 65 in the am, 40 in the PM  Labs Reviewed: Creat: 1.0 (04/02/2010)    Reviewed HgBA1c results: 6.8 (04/02/2010)  11.1 (12/11/2009)  Problem # 2:  HOARSENESS (BZJ-696.78) recurrent, intemittent, iwth allergy nasal symptoms  - change allegra to generic xyzal as needed, and f?u nay persistnet or worsening symtpoms., cont the PPI  Problem # 3:  HYPERTENSION (ICD-401.9)  Her updated medication list for this problem includes:    Furosemide 80 Mg Tabs (Furosemide) .Marland Kitchen... 1 by mouth qam     Amlodipine Besylate 10 Mg Tabs (Amlodipine besylate) .Marland Kitchen... Take one tablet by mouth daily    Losartan Potassium 100 Mg Tabs (Losartan potassium) .Marland KitchenMarland KitchenMarland KitchenMarland Kitchen  Take 1 tablet by mouth once a day  BP today: 110/62 Prior BP: 126/70 (05/11/2010)  Prior 10 Yr Risk Heart Disease: N/A (09/11/2009)  Labs Reviewed: K+: 3.8 (04/02/2010) Creat: : 1.0 (04/02/2010)   Chol: 109 (04/02/2010)   HDL: 35.00 (04/02/2010)   LDL: 50 (04/02/2010)   TG: 118.0 (04/02/2010) stable overall by hx and exam, ok to continue meds/tx as is   Problem # 4:  DEPRESSION (ICD-311)  ok to start citalopram 10 mg per day    Her updated medication list for this problem includes:    Citalopram Hydrobromide 10 Mg Tabs (Citalopram hydrobromide) .Marland Kitchen... 1po once daily  Discussed treatment options, including trial of antidpressant meds.  Patient agrees to call if any worsening of symptoms or thoughts of doing harm arise. Verified that the patient has no suicidal ideation at this time.   Complete Medication List: 1)  Aurora Lancet Super Thin 30g Misc (Lancets) .... Use 1 stick as directed once  a day 2)  Furosemide 80 Mg Tabs (Furosemide) .Marland Kitchen.. 1 by mouth qam 3)  Onetouch Ultra Test Strp (Glucose blood) .... Use asd 1 strip tid 4)  Omeprazole 20 Mg Cpdr (Omeprazole) .... 2 by mouth once daily 5)  Aspirin Ec 325 Mg Tbec (Aspirin) .... Take one tablet by mouth daily 6)  Nitroquick 0.4 Mg Subl (Nitroglycerin) .... Use asd 7)  Pravachol 40 Mg Tabs (Pravastatin sodium) .Marland Kitchen.. 1 by mouth qd 8)  Potassium Chloride Crys Cr 20 Meq Cr-tabs (Potassium chloride crys cr) .... Take one tablet by mouth twice a day 9)  Allopurinol 100 Mg Tabs (Allopurinol) .Marland Kitchen.. 1po once daily 10)  Onetouch Ultra Test Strp (Glucose blood) .... Two times a day, and lancets 250.00 11)  Oxycodone Hcl 5 Mg Tabs (Oxycodone hcl) .Marland Kitchen.. 1  - 2  tabs by mouth q 6 hrs as needed pain 12)  Methocarbamol 500 Mg Tabs (Methocarbamol) .Marland Kitchen.. 1 q6h as needed cramps 13)  Levocetirizine  Dihydrochloride 5 Mg Tabs (Levocetirizine dihydrochloride) .Marland Kitchen.. 1 by mouth once daily as needed allergies 14)  Humulin N 100 Unit/ml Susp (Insulin isophane human) .... 65 units each morning, and 50 units in the evening 15)  Amlodipine Besylate 10 Mg Tabs (Amlodipine besylate) .... Take one tablet by mouth daily 16)  Losartan Potassium 100 Mg Tabs (Losartan potassium) .... Take 1 tablet by mouth once a day 17)  Polysaccharide Iron Complex 150 Mg Caps (Polysaccharide iron complex) .... Take 1 capsule by mouth once a day 18)  Synthroid 100 Mcg Tabs (Levothyroxine sodium) .... Take 1 tablet by mouth once a day 19)  Vitamin D2 400 Unit Tabs (Ergocalciferol) .... Take 1 tablet by mouth once a day 20)  Diclofenac Sodium 50 Mg Tbec (Diclofenac sodium) .Marland Kitchen.. 1po two times a day as needed pain 21)  Citalopram Hydrobromide 10 Mg Tabs (Citalopram hydrobromide) .Marland Kitchen.. 1po once daily  Patient Instructions: 1)  please increase the Humulin N to 65 units in the AM, and 50 units in the PM 2)  start the citalopram 10 mg per day 3)  Continue all previous medications as before this visit  4)  Please re-schedule with DR Everardo All for DM in 2 months 5)  Please schedule a follow-up appointment wtih me in 6 months with  CPX labs and: 6)  HbgA1C prior to visit, ICD-9: 250.02 7)  Urine Microalbumin prior to visit, ICD-9: Prescriptions: LEVOCETIRIZINE DIHYDROCHLORIDE 5 MG TABS (LEVOCETIRIZINE DIHYDROCHLORIDE) 1 by mouth once daily as needed allergies  #30 x 11  Entered and Authorized by:   Corwin Levins MD   Signed by:   Corwin Levins MD on 06/04/2010   Method used:   Electronically to        [facility] (retail)       [address]       [place], [place]  [address]       Ph: [phone]       Fax: [phone]   RxID:   [id number] HUMULIN N 100 UNIT/ML SUSP (INSULIN ISOPHANE HUMAN) 65 units each morning, and 50 units in the evening  #4bottle x 5   Entered and Authorized by:   Corwin Levins MD   Signed by:   Corwin Levins MD on 06/04/2010   Method used:   Print then Give to Patient   RxID:   [id number] CITALOPRAM HYDROBROMIDE 10 MG TABS (CITALOPRAM HYDROBROMIDE) 1po once daily  #90 x 3   Entered and Authorized by:   Corwin Levins MD   Signed by:   Corwin Levins MD on 06/04/2010   Method used:   Electronically to        [facility] (retail)       [address]       [place], [place]  [address]       Ph: [phone]       Fax: [phone]   RxID:   [id number] DICLOFENAC SODIUM 50 MG TBEC (DICLOFENAC SODIUM) 1po two times a day as needed pain  #60 x 5   Entered and Authorized by:   Corwin Levins MD   Signed by:   Corwin Levins MD on 06/04/2010   Method used:   Electronically to        [facility] (retail)       [address]       [place], [place]  [address]       Ph: [phone]       Fax: [phone]   RxID:   [id number]    Orders Added: 1)  Glucose, (CBG) [82962] 2)  Est. Patient Level IV [37169]    Laboratory Results   Blood Tests     CBG Random:: 198mg /dL

## 2010-06-08 NOTE — Progress Notes (Signed)
  Phone Note Refill Request Message from:  Fax from Pharmacy on June 01, 2010 9:11 AM  Refills Requested: Medication #1:  OMEPRAZOLE 20 MG  CPDR 2 by mouth once daily   Dosage confirmed as above?Dosage Confirmed   Notes: [facility] Initial call taken by: Robin Ewing CMA (AAMA),  June 01, 2010 9:12 AM    Prescriptions: OMEPRAZOLE 20 MG  CPDR (OMEPRAZOLE) 2 by mouth once daily  #180 x 3   Entered by:   Scharlene Gloss CMA (AAMA)   Authorized by:   Corwin Levins MD   Signed by:   Scharlene Gloss CMA (AAMA) on 06/01/2010   Method used:   Faxed to ...       [facility]       [address]       [place], [place]  [address]       Ph: [phone]       Fax: [phone]   RxID:   [id number]

## 2010-06-10 ENCOUNTER — Telehealth (INDEPENDENT_AMBULATORY_CARE_PROVIDER_SITE_OTHER): Payer: Self-pay | Admitting: *Deleted

## 2010-06-17 NOTE — Letter (Signed)
Summary: [facility]   Imported By: Sherian Rein 06/09/2010 13:46:16  _____________________________________________________________________  External Attachment:    Type:   Image     Comment:   External Document

## 2010-06-17 NOTE — Progress Notes (Signed)
Summary: Records Request  Faxed OV, EKG & Cath to [facility] ([phone]). Debby Freiberg  June 10, 2010 9:49 AM

## 2010-06-22 LAB — BLOOD GAS, ARTERIAL
Drawn by: 24531
FIO2: 0.21 %
pCO2 arterial: 36 mmHg (ref 35.0–45.0)
pH, Arterial: 7.471 — ABNORMAL HIGH (ref 7.350–7.400)
pO2, Arterial: 94.4 mmHg (ref 80.0–100.0)

## 2010-06-22 LAB — COMPREHENSIVE METABOLIC PANEL
AST: 29 U/L (ref 0–37)
Albumin: 3.1 g/dL — ABNORMAL LOW (ref 3.5–5.2)
Albumin: 3.3 g/dL — ABNORMAL LOW (ref 3.5–5.2)
BUN: 17 mg/dL (ref 6–23)
BUN: 33 mg/dL — ABNORMAL HIGH (ref 6–23)
CO2: 27 mEq/L (ref 19–32)
CO2: 30 mEq/L (ref 19–32)
Calcium: 8.9 mg/dL (ref 8.4–10.5)
Calcium: 9.1 mg/dL (ref 8.4–10.5)
Calcium: 9.1 mg/dL (ref 8.4–10.5)
Chloride: 105 mEq/L (ref 96–112)
Chloride: 105 mEq/L (ref 96–112)
Creatinine, Ser: 0.82 mg/dL (ref 0.4–1.2)
Creatinine, Ser: 1.19 mg/dL (ref 0.4–1.2)
Creatinine, Ser: 1.47 mg/dL — ABNORMAL HIGH (ref 0.4–1.2)
GFR calc Af Amer: >60 mL/min (ref >60)
GFR calc non Af Amer: 35 mL/min — ABNORMAL LOW (ref >60)
GFR calc non Af Amer: 44 mL/min — ABNORMAL LOW (ref >60)
GFR calc non Af Amer: >60 mL/min (ref >60)
Glucose, Bld: 187 mg/dL — ABNORMAL HIGH (ref 70–99)
Total Bilirubin: 0.4 mg/dL (ref 0.3–1.2)
Total Bilirubin: 0.6 mg/dL (ref 0.3–1.2)
Total Protein: 6.4 g/dL (ref 6.0–8.3)

## 2010-06-22 LAB — CBC
HCT: 22.3 % — ABNORMAL LOW (ref 36.0–46.0)
HCT: 27.5 % — ABNORMAL LOW (ref 36.0–46.0)
HCT: 28 % — ABNORMAL LOW (ref 36.0–46.0)
HCT: 28.2 % — ABNORMAL LOW (ref 36.0–46.0)
HCT: 28.6 % — ABNORMAL LOW (ref 36.0–46.0)
HCT: 29.9 % — ABNORMAL LOW (ref 36.0–46.0)
HCT: 37.9 % (ref 36.0–46.0)
HCT: 41 % (ref 36.0–46.0)
Hemoglobin: 12.4 g/dL (ref 12.0–15.0)
Hemoglobin: 7.3 g/dL — ABNORMAL LOW (ref 12.0–15.0)
Hemoglobin: 7.6 g/dL — ABNORMAL LOW (ref 12.0–15.0)
Hemoglobin: 8.1 g/dL — ABNORMAL LOW (ref 12.0–15.0)
Hemoglobin: 8.2 g/dL — ABNORMAL LOW (ref 12.0–15.0)
Hemoglobin: 8.8 g/dL — ABNORMAL LOW (ref 12.0–15.0)
Hemoglobin: 8.9 g/dL — ABNORMAL LOW (ref 12.0–15.0)
Hemoglobin: 8.9 g/dL — ABNORMAL LOW (ref 12.0–15.0)
Hemoglobin: 9.2 g/dL — ABNORMAL LOW (ref 12.0–15.0)
Hemoglobin: 9.4 g/dL — ABNORMAL LOW (ref 12.0–15.0)
Hemoglobin: 9.5 g/dL — ABNORMAL LOW (ref 12.0–15.0)
MCH: 27.6 pg (ref 26.0–34.0)
MCH: 28.5 pg (ref 26.0–34.0)
MCH: 28.7 pg (ref 26.0–34.0)
MCH: 28.8 pg (ref 26.0–34.0)
MCH: 28.8 pg (ref 26.0–34.0)
MCH: 28.8 pg (ref 26.0–34.0)
MCH: 29 pg (ref 26.0–34.0)
MCH: 29.1 pg (ref 26.0–34.0)
MCH: 29.2 pg (ref 26.0–34.0)
MCH: 29.4 pg (ref 26.0–34.0)
MCH: 29.5 pg (ref 26.0–34.0)
MCHC: 31.6 g/dL (ref 30.0–36.0)
MCHC: 31.8 g/dL (ref 30.0–36.0)
MCHC: 32 g/dL (ref 30.0–36.0)
MCHC: 32.2 g/dL (ref 30.0–36.0)
MCHC: 32.4 g/dL (ref 30.0–36.0)
MCHC: 32.5 g/dL (ref 30.0–36.0)
MCHC: 32.7 g/dL (ref 30.0–36.0)
MCHC: 32.7 g/dL (ref 30.0–36.0)
MCHC: 33 g/dL (ref 30.0–36.0)
MCHC: 33.2 g/dL (ref 30.0–36.0)
MCHC: 33.2 g/dL (ref 30.0–36.0)
MCHC: 33.5 g/dL (ref 30.0–36.0)
MCHC: 33.7 g/dL (ref 30.0–36.0)
MCV: 85.2 fL (ref 78.0–100.0)
MCV: 86.5 fL (ref 78.0–100.0)
MCV: 86.9 fL (ref 78.0–100.0)
MCV: 87.8 fL (ref 78.0–100.0)
MCV: 88.4 fL (ref 78.0–100.0)
MCV: 90 fL (ref 78.0–100.0)
MCV: 90.3 fL (ref 78.0–100.0)
MCV: 90.3 fL (ref 78.0–100.0)
MCV: 90.8 fL (ref 78.0–100.0)
MCV: 91.7 fL (ref 78.0–100.0)
Platelets: 118 10*3/uL — ABNORMAL LOW (ref 150–400)
Platelets: 118 10*3/uL — ABNORMAL LOW (ref 150–400)
Platelets: 119 10*3/uL — ABNORMAL LOW (ref 150–400)
Platelets: 125 10*3/uL — ABNORMAL LOW (ref 150–400)
Platelets: 167 10*3/uL (ref 150–400)
Platelets: 177 10*3/uL (ref 150–400)
Platelets: 224 10*3/uL (ref 150–400)
Platelets: 78 10*3/uL — ABNORMAL LOW (ref 150–400)
Platelets: 98 10*3/uL — ABNORMAL LOW (ref 150–400)
RBC: 2.51 MIL/uL — ABNORMAL LOW (ref 3.87–5.11)
RBC: 2.85 MIL/uL — ABNORMAL LOW (ref 3.87–5.11)
RBC: 3.03 MIL/uL — ABNORMAL LOW (ref 3.87–5.11)
RBC: 3.1 MIL/uL — ABNORMAL LOW (ref 3.87–5.11)
RBC: 3.24 MIL/uL — ABNORMAL LOW (ref 3.87–5.11)
RBC: 3.25 MIL/uL — ABNORMAL LOW (ref 3.87–5.11)
RBC: 3.26 MIL/uL — ABNORMAL LOW (ref 3.87–5.11)
RBC: 4.38 MIL/uL (ref 3.87–5.11)
RBC: 4.66 MIL/uL (ref 3.87–5.11)
RDW: 13 % (ref 11.5–15.5)
RDW: 13.9 % (ref 11.5–15.5)
RDW: 14.2 % (ref 11.5–15.5)
RDW: 14.6 % (ref 11.5–15.5)
RDW: 14.9 % (ref 11.5–15.5)
RDW: 15 % (ref 11.5–15.5)
RDW: 15.3 % (ref 11.5–15.5)
RDW: 15.4 % (ref 11.5–15.5)
WBC: 10.6 10*3/uL — ABNORMAL HIGH (ref 4.0–10.5)
WBC: 11.6 10*3/uL — ABNORMAL HIGH (ref 4.0–10.5)
WBC: 11.9 10*3/uL — ABNORMAL HIGH (ref 4.0–10.5)
WBC: 18 10*3/uL — ABNORMAL HIGH (ref 4.0–10.5)
WBC: 20.4 10*3/uL — ABNORMAL HIGH (ref 4.0–10.5)
WBC: 20.9 10*3/uL — ABNORMAL HIGH (ref 4.0–10.5)

## 2010-06-22 LAB — BASIC METABOLIC PANEL
BUN: 16 mg/dL (ref 6–23)
BUN: 17 mg/dL (ref 6–23)
BUN: 20 mg/dL (ref 6–23)
BUN: 22 mg/dL (ref 6–23)
BUN: 24 mg/dL — ABNORMAL HIGH (ref 6–23)
BUN: 25 mg/dL — ABNORMAL HIGH (ref 6–23)
BUN: 28 mg/dL — ABNORMAL HIGH (ref 6–23)
CO2: 24 mEq/L (ref 19–32)
CO2: 28 mEq/L (ref 19–32)
CO2: 28 mEq/L (ref 19–32)
CO2: 29 mEq/L (ref 19–32)
CO2: 29 mEq/L (ref 19–32)
CO2: 29 mEq/L (ref 19–32)
CO2: 29 mEq/L (ref 19–32)
CO2: 29 mEq/L (ref 19–32)
CO2: 32 mEq/L (ref 19–32)
Calcium: 7.5 mg/dL — ABNORMAL LOW (ref 8.4–10.5)
Calcium: 7.6 mg/dL — ABNORMAL LOW (ref 8.4–10.5)
Calcium: 7.7 mg/dL — ABNORMAL LOW (ref 8.4–10.5)
Calcium: 8.1 mg/dL — ABNORMAL LOW (ref 8.4–10.5)
Calcium: 8.6 mg/dL (ref 8.4–10.5)
Calcium: 8.6 mg/dL (ref 8.4–10.5)
Calcium: 8.7 mg/dL (ref 8.4–10.5)
Calcium: 8.7 mg/dL (ref 8.4–10.5)
Calcium: 8.7 mg/dL (ref 8.4–10.5)
Calcium: 9 mg/dL (ref 8.4–10.5)
Chloride: 100 mEq/L (ref 96–112)
Chloride: 101 mEq/L (ref 96–112)
Chloride: 102 mEq/L (ref 96–112)
Chloride: 103 mEq/L (ref 96–112)
Chloride: 111 mEq/L (ref 96–112)
Chloride: 97 mEq/L (ref 96–112)
Chloride: 99 mEq/L (ref 96–112)
Creatinine, Ser: 0.87 mg/dL (ref 0.4–1.2)
Creatinine, Ser: 1.06 mg/dL (ref 0.4–1.2)
Creatinine, Ser: 1.17 mg/dL (ref 0.4–1.2)
Creatinine, Ser: 1.17 mg/dL (ref 0.4–1.2)
Creatinine, Ser: 1.18 mg/dL (ref 0.4–1.2)
Creatinine, Ser: 1.45 mg/dL — ABNORMAL HIGH (ref 0.4–1.2)
GFR calc Af Amer: 41 mL/min — ABNORMAL LOW (ref >60)
GFR calc Af Amer: 43 mL/min — ABNORMAL LOW (ref >60)
GFR calc Af Amer: 45 mL/min — ABNORMAL LOW (ref >60)
GFR calc Af Amer: 55 mL/min — ABNORMAL LOW (ref >60)
GFR calc Af Amer: >60 mL/min (ref >60)
GFR calc Af Amer: >60 mL/min (ref >60)
GFR calc Af Amer: >60 mL/min (ref >60)
GFR calc Af Amer: >60 mL/min (ref >60)
GFR calc non Af Amer: 49 mL/min — ABNORMAL LOW (ref >60)
GFR calc non Af Amer: 51 mL/min — ABNORMAL LOW (ref >60)
GFR calc non Af Amer: 53 mL/min — ABNORMAL LOW (ref >60)
GFR calc non Af Amer: >60 mL/min (ref >60)
GFR calc non Af Amer: >60 mL/min (ref >60)
Glucose, Bld: 114 mg/dL — ABNORMAL HIGH (ref 70–99)
Glucose, Bld: 123 mg/dL — ABNORMAL HIGH (ref 70–99)
Glucose, Bld: 132 mg/dL — ABNORMAL HIGH (ref 70–99)
Glucose, Bld: 134 mg/dL — ABNORMAL HIGH (ref 70–99)
Glucose, Bld: 181 mg/dL — ABNORMAL HIGH (ref 70–99)
Glucose, Bld: 192 mg/dL — ABNORMAL HIGH (ref 70–99)
Glucose, Bld: 207 mg/dL — ABNORMAL HIGH (ref 70–99)
Glucose, Bld: 320 mg/dL — ABNORMAL HIGH (ref 70–99)
Glucose, Bld: 69 mg/dL — ABNORMAL LOW (ref 70–99)
Potassium: 3.7 mEq/L (ref 3.5–5.1)
Potassium: 3.7 mEq/L (ref 3.5–5.1)
Potassium: 3.9 mEq/L (ref 3.5–5.1)
Potassium: 3.9 mEq/L (ref 3.5–5.1)
Potassium: 4 mEq/L (ref 3.5–5.1)
Potassium: 4.3 mEq/L (ref 3.5–5.1)
Sodium: 134 mEq/L — ABNORMAL LOW (ref 135–145)
Sodium: 134 mEq/L — ABNORMAL LOW (ref 135–145)
Sodium: 134 mEq/L — ABNORMAL LOW (ref 135–145)
Sodium: 135 mEq/L (ref 135–145)
Sodium: 137 mEq/L (ref 135–145)
Sodium: 137 mEq/L (ref 135–145)
Sodium: 139 mEq/L (ref 135–145)

## 2010-06-22 LAB — URINE MICROSCOPIC-ADD ON

## 2010-06-22 LAB — GLUCOSE, CAPILLARY
Glucose-Capillary: 107 mg/dL — ABNORMAL HIGH (ref 70–99)
Glucose-Capillary: 109 mg/dL — ABNORMAL HIGH (ref 70–99)
Glucose-Capillary: 111 mg/dL — ABNORMAL HIGH (ref 70–99)
Glucose-Capillary: 113 mg/dL — ABNORMAL HIGH (ref 70–99)
Glucose-Capillary: 121 mg/dL — ABNORMAL HIGH (ref 70–99)
Glucose-Capillary: 124 mg/dL — ABNORMAL HIGH (ref 70–99)
Glucose-Capillary: 130 mg/dL — ABNORMAL HIGH (ref 70–99)
Glucose-Capillary: 130 mg/dL — ABNORMAL HIGH (ref 70–99)
Glucose-Capillary: 131 mg/dL — ABNORMAL HIGH (ref 70–99)
Glucose-Capillary: 136 mg/dL — ABNORMAL HIGH (ref 70–99)
Glucose-Capillary: 138 mg/dL — ABNORMAL HIGH (ref 70–99)
Glucose-Capillary: 141 mg/dL — ABNORMAL HIGH (ref 70–99)
Glucose-Capillary: 142 mg/dL — ABNORMAL HIGH (ref 70–99)
Glucose-Capillary: 143 mg/dL — ABNORMAL HIGH (ref 70–99)
Glucose-Capillary: 144 mg/dL — ABNORMAL HIGH (ref 70–99)
Glucose-Capillary: 145 mg/dL — ABNORMAL HIGH (ref 70–99)
Glucose-Capillary: 148 mg/dL — ABNORMAL HIGH (ref 70–99)
Glucose-Capillary: 152 mg/dL — ABNORMAL HIGH (ref 70–99)
Glucose-Capillary: 155 mg/dL — ABNORMAL HIGH (ref 70–99)
Glucose-Capillary: 156 mg/dL — ABNORMAL HIGH (ref 70–99)
Glucose-Capillary: 163 mg/dL — ABNORMAL HIGH (ref 70–99)
Glucose-Capillary: 163 mg/dL — ABNORMAL HIGH (ref 70–99)
Glucose-Capillary: 166 mg/dL — ABNORMAL HIGH (ref 70–99)
Glucose-Capillary: 166 mg/dL — ABNORMAL HIGH (ref 70–99)
Glucose-Capillary: 170 mg/dL — ABNORMAL HIGH (ref 70–99)
Glucose-Capillary: 172 mg/dL — ABNORMAL HIGH (ref 70–99)
Glucose-Capillary: 178 mg/dL — ABNORMAL HIGH (ref 70–99)
Glucose-Capillary: 179 mg/dL — ABNORMAL HIGH (ref 70–99)
Glucose-Capillary: 191 mg/dL — ABNORMAL HIGH (ref 70–99)
Glucose-Capillary: 193 mg/dL — ABNORMAL HIGH (ref 70–99)
Glucose-Capillary: 194 mg/dL — ABNORMAL HIGH (ref 70–99)
Glucose-Capillary: 195 mg/dL — ABNORMAL HIGH (ref 70–99)
Glucose-Capillary: 195 mg/dL — ABNORMAL HIGH (ref 70–99)
Glucose-Capillary: 197 mg/dL — ABNORMAL HIGH (ref 70–99)
Glucose-Capillary: 209 mg/dL — ABNORMAL HIGH (ref 70–99)
Glucose-Capillary: 220 mg/dL — ABNORMAL HIGH (ref 70–99)
Glucose-Capillary: 222 mg/dL — ABNORMAL HIGH (ref 70–99)
Glucose-Capillary: 246 mg/dL — ABNORMAL HIGH (ref 70–99)
Glucose-Capillary: 277 mg/dL — ABNORMAL HIGH (ref 70–99)
Glucose-Capillary: 319 mg/dL — ABNORMAL HIGH (ref 70–99)
Glucose-Capillary: 323 mg/dL — ABNORMAL HIGH (ref 70–99)
Glucose-Capillary: 324 mg/dL — ABNORMAL HIGH (ref 70–99)
Glucose-Capillary: 348 mg/dL — ABNORMAL HIGH (ref 70–99)
Glucose-Capillary: 91 mg/dL (ref 70–99)
Glucose-Capillary: 96 mg/dL (ref 70–99)
Glucose-Capillary: 96 mg/dL (ref 70–99)

## 2010-06-22 LAB — URINALYSIS, ROUTINE W REFLEX MICROSCOPIC
Bilirubin Urine: NEGATIVE
Glucose, UA: NEGATIVE mg/dL
Hgb urine dipstick: NEGATIVE
Ketones, ur: NEGATIVE mg/dL
Ketones, ur: NEGATIVE mg/dL
Nitrite: NEGATIVE
Protein, ur: NEGATIVE mg/dL
Urobilinogen, UA: 1 mg/dL (ref 0.0–1.0)
Urobilinogen, UA: 1 mg/dL (ref 0.0–1.0)

## 2010-06-22 LAB — HEPARIN INDUCED THROMBOCYTOPENIA PNL
Heparin Induced Plt Ab: NEGATIVE
Patient O.D.: 0.075
UFH Low Dose 0.1 IU/mL: 1 % Release
UFH Low Dose 0.5 IU/mL: 0 % Release

## 2010-06-22 LAB — TYPE AND SCREEN
ABO/RH(D): B POS
Unit division: 0
Unit division: 0
Unit division: 0

## 2010-06-22 LAB — POCT I-STAT 3, ART BLOOD GAS (G3+)
Acid-base deficit: 3 mmol/L — ABNORMAL HIGH (ref 0.0–2.0)
Acid-base deficit: 3 mmol/L — ABNORMAL HIGH (ref 0.0–2.0)
Bicarbonate: 20.7 mEq/L (ref 20.0–24.0)
Bicarbonate: 22.1 mEq/L (ref 20.0–24.0)
Bicarbonate: 25 mEq/L — ABNORMAL HIGH (ref 20.0–24.0)
O2 Saturation: 100 %
O2 Saturation: 99 %
Patient temperature: 100.9
Patient temperature: 37.1
TCO2: 26 mmol/L (ref 0–100)
pCO2 arterial: 27.8 mmHg — ABNORMAL LOW (ref 35.0–45.0)
pCO2 arterial: 37 mmHg (ref 35.0–45.0)
pCO2 arterial: 40.7 mmHg (ref 35.0–45.0)
pH, Arterial: 7.396 (ref 7.350–7.400)
pH, Arterial: 7.399 (ref 7.350–7.400)
pO2, Arterial: 156 mmHg — ABNORMAL HIGH (ref 80.0–100.0)
pO2, Arterial: 274 mmHg — ABNORMAL HIGH (ref 80.0–100.0)
pO2, Arterial: 356 mmHg — ABNORMAL HIGH (ref 80.0–100.0)

## 2010-06-22 LAB — POCT I-STAT 4, (NA,K, GLUC, HGB,HCT)
Glucose, Bld: 120 mg/dL — ABNORMAL HIGH (ref 70–99)
Glucose, Bld: 170 mg/dL — ABNORMAL HIGH (ref 70–99)
HCT: 29 % — ABNORMAL LOW (ref 36.0–46.0)
HCT: 38 % (ref 36.0–46.0)
Hemoglobin: 12.9 g/dL (ref 12.0–15.0)
Hemoglobin: 7.5 g/dL — ABNORMAL LOW (ref 12.0–15.0)
Hemoglobin: 8.5 g/dL — ABNORMAL LOW (ref 12.0–15.0)
Hemoglobin: 9.9 g/dL — ABNORMAL LOW (ref 12.0–15.0)
Potassium: 4.6 mEq/L (ref 3.5–5.1)
Potassium: 4.7 mEq/L (ref 3.5–5.1)
Sodium: 134 mEq/L — ABNORMAL LOW (ref 135–145)
Sodium: 138 mEq/L (ref 135–145)
Sodium: 138 mEq/L (ref 135–145)
Sodium: 139 mEq/L (ref 135–145)

## 2010-06-22 LAB — POCT I-STAT 3, VENOUS BLOOD GAS (G3P V)
Bicarbonate: 23.8 mEq/L (ref 20.0–24.0)
Patient temperature: 31
pH, Ven: 7.487 — ABNORMAL HIGH (ref 7.250–7.300)
pO2, Ven: 27 mmHg — CL (ref 30.0–45.0)

## 2010-06-22 LAB — BRAIN NATRIURETIC PEPTIDE: Pro B Natriuretic peptide (BNP): 75 pg/mL (ref 0.0–100.0)

## 2010-06-22 LAB — PROTIME-INR
INR: 0.96 (ref 0.00–1.49)
INR: 0.98 (ref 0.00–1.49)
INR: 1.54 — ABNORMAL HIGH (ref 0.00–1.49)
Prothrombin Time: 18.7 seconds — ABNORMAL HIGH (ref 11.6–15.2)

## 2010-06-22 LAB — HEMOGLOBIN AND HEMATOCRIT, BLOOD: Hemoglobin: 6.8 g/dL — CL (ref 12.0–15.0)

## 2010-06-22 LAB — CARDIAC PANEL(CRET KIN+CKTOT+MB+TROPI)
CK, MB: 6.9 ng/mL (ref 0.3–4.0)
Relative Index: 1.5 (ref 0.0–2.5)
Total CK: 224 U/L — ABNORMAL HIGH (ref 7–177)
Total CK: 389 U/L — ABNORMAL HIGH (ref 7–177)
Total CK: 417 U/L — ABNORMAL HIGH (ref 7–177)
Troponin I: <0.01 ng/mL (ref 0.00–0.06)
Troponin I: <0.01 ng/mL (ref 0.00–0.06)
Troponin I: <0.01 ng/mL (ref 0.00–0.06)

## 2010-06-22 LAB — CREATININE, SERUM
Creatinine, Ser: 0.96 mg/dL (ref 0.4–1.2)
Creatinine, Ser: 1.23 mg/dL — ABNORMAL HIGH (ref 0.4–1.2)
GFR calc Af Amer: 52 mL/min — ABNORMAL LOW (ref >60)
GFR calc Af Amer: >60 mL/min (ref >60)

## 2010-06-22 LAB — URINE CULTURE
Colony Count: 35000
Culture  Setup Time: 201111161310
Special Requests: NEGATIVE

## 2010-06-22 LAB — MAGNESIUM
Magnesium: 2.7 mg/dL — ABNORMAL HIGH (ref 1.5–2.5)
Magnesium: 2.8 mg/dL — ABNORMAL HIGH (ref 1.5–2.5)
Magnesium: 2.8 mg/dL — ABNORMAL HIGH (ref 1.5–2.5)

## 2010-06-22 LAB — POCT I-STAT, CHEM 8
Hemoglobin: 6.8 g/dL — CL (ref 12.0–15.0)
Potassium: 4.6 mEq/L (ref 3.5–5.1)
Sodium: 138 mEq/L (ref 135–145)
TCO2: 23 mmol/L (ref 0–100)

## 2010-06-22 LAB — ABO/RH: ABO/RH(D): B POS

## 2010-06-22 LAB — APTT
aPTT: 26 seconds (ref 24–37)
aPTT: 26 seconds (ref 24–37)
aPTT: 35 seconds (ref 24–37)

## 2010-06-22 LAB — TSH: TSH: 3.123 u[IU]/mL (ref 0.350–4.500)

## 2010-06-22 LAB — POCT I-STAT GLUCOSE: Glucose, Bld: 145 mg/dL — ABNORMAL HIGH (ref 70–99)

## 2010-06-22 LAB — MRSA PCR SCREENING: MRSA by PCR: NEGATIVE

## 2010-07-04 ENCOUNTER — Emergency Department (HOSPITAL_COMMUNITY)
Admission: EM | Admit: 2010-07-04 | Discharge: 2010-07-04 | Disposition: A | Discharge status: Home / Self Care | Payer: No Typology Code available for payment source | Attending: Emergency Medicine | Admitting: Emergency Medicine

## 2010-07-04 ENCOUNTER — Emergency Department (HOSPITAL_COMMUNITY): Payer: No Typology Code available for payment source

## 2010-07-04 DIAGNOSIS — Z794 Long term (current) use of insulin: Secondary | ICD-10-CM | POA: Insufficient documentation

## 2010-07-04 DIAGNOSIS — Z862 Personal history of diseases of the blood and blood-forming organs and certain disorders involving the immune mechanism: Secondary | ICD-10-CM | POA: Insufficient documentation

## 2010-07-04 DIAGNOSIS — Z8639 Personal history of other endocrine, nutritional and metabolic disease: Secondary | ICD-10-CM | POA: Insufficient documentation

## 2010-07-04 DIAGNOSIS — I10 Essential (primary) hypertension: Secondary | ICD-10-CM | POA: Insufficient documentation

## 2010-07-04 DIAGNOSIS — Z79899 Other long term (current) drug therapy: Secondary | ICD-10-CM | POA: Insufficient documentation

## 2010-07-04 DIAGNOSIS — S0990XA Unspecified injury of head, initial encounter: Secondary | ICD-10-CM | POA: Insufficient documentation

## 2010-07-04 DIAGNOSIS — I2510 Atherosclerotic heart disease of native coronary artery without angina pectoris: Secondary | ICD-10-CM | POA: Insufficient documentation

## 2010-07-04 DIAGNOSIS — R51 Headache: Secondary | ICD-10-CM | POA: Insufficient documentation

## 2010-07-04 DIAGNOSIS — S7000XA Contusion of unspecified hip, initial encounter: Secondary | ICD-10-CM | POA: Insufficient documentation

## 2010-07-04 DIAGNOSIS — R079 Chest pain, unspecified: Secondary | ICD-10-CM | POA: Insufficient documentation

## 2010-07-04 DIAGNOSIS — IMO0002 Reserved for concepts with insufficient information to code with codable children: Secondary | ICD-10-CM | POA: Insufficient documentation

## 2010-07-04 DIAGNOSIS — E119 Type 2 diabetes mellitus without complications: Secondary | ICD-10-CM | POA: Insufficient documentation

## 2010-07-04 DIAGNOSIS — E785 Hyperlipidemia, unspecified: Secondary | ICD-10-CM | POA: Insufficient documentation

## 2010-07-04 DIAGNOSIS — Z7982 Long term (current) use of aspirin: Secondary | ICD-10-CM | POA: Insufficient documentation

## 2010-07-04 DIAGNOSIS — M25559 Pain in unspecified hip: Secondary | ICD-10-CM | POA: Insufficient documentation

## 2010-07-08 ENCOUNTER — Encounter: Payer: Self-pay | Admitting: Internal Medicine

## 2010-07-08 ENCOUNTER — Ambulatory Visit (INDEPENDENT_AMBULATORY_CARE_PROVIDER_SITE_OTHER): Payer: No Typology Code available for payment source | Admitting: Internal Medicine

## 2010-07-08 VITALS — BP 104/72 | HR 56 | Temp 97.8°F | Wt 230.0 lb

## 2010-07-08 DIAGNOSIS — Z0000 Encounter for general adult medical examination without abnormal findings: Secondary | ICD-10-CM

## 2010-07-08 DIAGNOSIS — E119 Type 2 diabetes mellitus without complications: Secondary | ICD-10-CM

## 2010-07-08 DIAGNOSIS — M25519 Pain in unspecified shoulder: Secondary | ICD-10-CM

## 2010-07-08 DIAGNOSIS — I10 Essential (primary) hypertension: Secondary | ICD-10-CM

## 2010-07-08 DIAGNOSIS — M545 Low back pain, unspecified: Secondary | ICD-10-CM

## 2010-07-08 DIAGNOSIS — M25512 Pain in left shoulder: Secondary | ICD-10-CM | POA: Insufficient documentation

## 2010-07-08 MED ORDER — OXYCODONE HCL 5 MG PO CAPS: 5.0000 mg | ORAL_CAPSULE | Freq: Four times a day (QID) | ORAL | Status: DC | PRN

## 2010-07-08 NOTE — Assessment & Plan Note (Signed)
stable overall by hx and exam, most recent lab reviewed with pt, and pt to continue medical treatment as before   BP Readings from Last 3 Encounters:  07/08/10 104/72  06/04/10 110/62  05/11/10 126/70

## 2010-07-08 NOTE — Assessment & Plan Note (Signed)
uncontrolled overall by hx and exam, most recent lab reviewed with pt, and pt to continue medical treatment as before; will ask dr Everardo All to see soon  Lab Results  Component Value Date   HGBA1C 6.8* 04/02/2010  \
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
## 2010-07-08 NOTE — Assessment & Plan Note (Signed)
Exam c/w prob left rotater cuff tear;  For pain med as above, also for MRI shoulder, and refer [facility] where she had her back surgury done (not sure name of surgeon)

## 2010-07-08 NOTE — Assessment & Plan Note (Signed)
Mild to mod, acute on chronic, exam benign, will refill pain med but o/w seems stable

## 2010-07-08 NOTE — Patient Instructions (Signed)
Take all new medications as prescribed Continue all other medications as before You will be contacted regarding the referral for: MRI for the left shoulder, and orthopedic evaluation Please see Dr Everardo All as you do for the Diabetes Please return in August 2012 with Lab testing done 3-5 days before

## 2010-07-09 ENCOUNTER — Other Ambulatory Visit: Payer: Self-pay

## 2010-07-09 ENCOUNTER — Telehealth: Payer: Self-pay | Admitting: Internal Medicine

## 2010-07-09 MED ORDER — INSULIN NPH (HUMAN) (ISOPHANE) 100 UNIT/ML ~~LOC~~ SUSP: SUBCUTANEOUS | Status: DC

## 2010-07-09 NOTE — Telephone Encounter (Signed)
See previous messages.

## 2010-07-09 NOTE — Telephone Encounter (Signed)
Message copied by Oliver Barre on Fri Jul 09, 2010 10:40 AM ------      Message from: Scharlene Gloss      Created: Fri Jul 09, 2010 10:17 AM       Called pt. Informed of increase for her insulin. Also informed to followup in 1 month. Patient will call back to schedule.      ----- Message -----         From: Oliver Barre, MD         Sent: 07/08/2010   4:31 PM           To: Zella Ball Ewing            See dr Everardo All rec's for insulin change             Please wait until tomorrow to do this            ----- Message -----         From: Minus Breeding, MD         Sent: 07/08/2010   3:58 PM           To: Oliver Barre, MD, Margaret Pyle, CMA            i reviewed current and past emr.  Pt was seen here by me 2 mos ago, and was advised to return in 1 month.  She did not return.  i see no record of her phone calls last week.  Please increase nph insulin to 80 units am and 40 units in the evening.  Needs f/u appt < 1 month.        ----- Message -----         From: Oliver Barre, MD         Sent: 07/08/2010   3:33 PM           To: Minus Breeding, MD, Zella Ball Ewing            Will forward to dr Everardo All            ----- Message -----         From: Zella Ball Ewing         Sent: 07/08/2010   2:17 PM           To: Oliver Barre, MD            The patient remembered her previous ortho was Dr. Darrelyn Hillock. Also she stated that Dr. Everardo All was doing nothing for her diabetes. She called 3 times last week to speak to his nurse as her BS was 450 and never got a call back.

## 2010-07-16 LAB — CBC
HCT: 35.1 % — ABNORMAL LOW (ref 36.0–46.0)
MCHC: 33.9 g/dL (ref 30.0–36.0)
MCV: 86.5 fL (ref 78.0–100.0)
Platelets: 97 10*3/uL — ABNORMAL LOW (ref 150–400)
RDW: 13.9 % (ref 11.5–15.5)
WBC: 6.6 10*3/uL (ref 4.0–10.5)

## 2010-07-16 LAB — COMPREHENSIVE METABOLIC PANEL
AST: 35 U/L (ref 0–37)
Albumin: 3.2 g/dL — ABNORMAL LOW (ref 3.5–5.2)
BUN: 18 mg/dL (ref 6–23)
Calcium: 8.6 mg/dL (ref 8.4–10.5)
Creatinine, Ser: 0.67 mg/dL (ref 0.4–1.2)
GFR calc Af Amer: >60 mL/min (ref >60)
Total Bilirubin: 0.6 mg/dL (ref 0.3–1.2)
Total Protein: 6.4 g/dL (ref 6.0–8.3)

## 2010-07-16 LAB — DIFFERENTIAL
Basophils Absolute: 0 10*3/uL (ref 0.0–0.1)
Eosinophils Relative: 4 % (ref 0–5)
Lymphocytes Relative: 21 % (ref 12–46)
Lymphs Abs: 1.4 10*3/uL (ref 0.7–4.0)
Monocytes Absolute: 0.7 10*3/uL (ref 0.1–1.0)
Monocytes Relative: 11 % (ref 3–12)
Neutro Abs: 4.2 10*3/uL (ref 1.7–7.7)

## 2010-07-16 LAB — POCT CARDIAC MARKERS
CKMB, poc: 3.6 ng/mL (ref 1.0–8.0)
Myoglobin, poc: 109 ng/mL (ref 12–200)

## 2010-07-19 ENCOUNTER — Telehealth: Payer: Self-pay

## 2010-07-19 MED ORDER — CIPROFLOXACIN HCL 500 MG PO TABS: 500.0000 mg | ORAL_TABLET | Freq: Two times a day (BID) | ORAL | Status: AC

## 2010-07-19 NOTE — Telephone Encounter (Signed)
Pt called stating she has been experiencing burning with urination, she has also noticed an odor. Pt is requesting Rx for ABX, please advise (last OV 07/08/2010)

## 2010-07-19 NOTE — Telephone Encounter (Signed)
Pt states that she has been advised of Insulin increase but is upset that she had not hear back from our office. After looking into chart further I realized that pt was contacted by Zella Ball Ewing and advised of same, Pt agreed that she was called back but also states that she least 3 messages on Triage and never received a call back. I told pt that I checked VM frequently and was not aware of messages. Pt states she thinks she left message either on triage or with SAE CMA. Pt did not want to discuss further and states she will cancel "[place]" and asked that make sure her upcoming appt was cancelled.

## 2010-07-19 NOTE — Telephone Encounter (Signed)
A user error has taken place: encounter opened in error, closed for administrative reasons.

## 2010-07-19 NOTE — Telephone Encounter (Signed)
cipro done per emr  Pt to  to f/u any worsening symptoms or concerns

## 2010-07-19 NOTE — Telephone Encounter (Signed)
Message copied by Margaret Pyle on Mon Jul 19, 2010  9:49 AM ------      Message from: Romero Belling      Created: Thu Jul 08, 2010  3:58 PM       i reviewed current and past emr.  Pt was seen here by me 2 mos ago, and was advised to return in 1 month.  She did not return.  i see no record of her phone calls last week.  Please increase nph insulin to 80 units am and 40 units in the evening.  Needs f/u appt < 1 month.        ----- Message -----         From: Oliver Barre, MD         Sent: 07/08/2010   3:33 PM           To: Minus Breeding, MD, Zella Ball Ewing            Will forward to dr Everardo All            ----- Message -----         From: Zella Ball Ewing         Sent: 07/08/2010   2:17 PM           To: Oliver Barre, MD            The patient remembered her previous ortho was Dr. Darrelyn Hillock. Also she stated that Dr. Everardo All was doing nothing for her diabetes. She called 3 times last week to speak to his nurse as her BS was 450 and never got a call back.

## 2010-07-20 NOTE — Telephone Encounter (Signed)
Pt advised of Rx and pharmacy 

## 2010-07-24 ENCOUNTER — Emergency Department (HOSPITAL_COMMUNITY)
Admission: EM | Admit: 2010-07-24 | Discharge: 2010-07-24 | Disposition: A | Discharge status: Other Acute Inpatient Hospital | Payer: Medicare Other | Source: Home / Self Care | Attending: Emergency Medicine | Admitting: Emergency Medicine

## 2010-07-24 ENCOUNTER — Inpatient Hospital Stay (HOSPITAL_COMMUNITY)
Admission: EM | Admit: 2010-07-24 | Discharge: 2010-07-27 | DRG: 312 | Disposition: A | Discharge status: Home / Self Care | Payer: Medicare Other | Source: Other Acute Inpatient Hospital | Attending: Cardiovascular Disease | Admitting: Cardiovascular Disease

## 2010-07-24 DIAGNOSIS — I2510 Atherosclerotic heart disease of native coronary artery without angina pectoris: Secondary | ICD-10-CM | POA: Insufficient documentation

## 2010-07-24 DIAGNOSIS — I739 Peripheral vascular disease, unspecified: Secondary | ICD-10-CM | POA: Diagnosis present

## 2010-07-24 DIAGNOSIS — R110 Nausea: Secondary | ICD-10-CM | POA: Insufficient documentation

## 2010-07-24 DIAGNOSIS — I509 Heart failure, unspecified: Secondary | ICD-10-CM | POA: Insufficient documentation

## 2010-07-24 DIAGNOSIS — Z951 Presence of aortocoronary bypass graft: Secondary | ICD-10-CM

## 2010-07-24 DIAGNOSIS — E669 Obesity, unspecified: Secondary | ICD-10-CM | POA: Diagnosis present

## 2010-07-24 DIAGNOSIS — R42 Dizziness and giddiness: Secondary | ICD-10-CM | POA: Insufficient documentation

## 2010-07-24 DIAGNOSIS — I498 Other specified cardiac arrhythmias: Secondary | ICD-10-CM | POA: Insufficient documentation

## 2010-07-24 DIAGNOSIS — R5381 Other malaise: Secondary | ICD-10-CM

## 2010-07-24 DIAGNOSIS — Z794 Long term (current) use of insulin: Secondary | ICD-10-CM

## 2010-07-24 DIAGNOSIS — I119 Hypertensive heart disease without heart failure: Secondary | ICD-10-CM | POA: Diagnosis present

## 2010-07-24 DIAGNOSIS — R55 Syncope and collapse: Secondary | ICD-10-CM

## 2010-07-24 DIAGNOSIS — T368X5A Adverse effect of other systemic antibiotics, initial encounter: Secondary | ICD-10-CM | POA: Diagnosis present

## 2010-07-24 DIAGNOSIS — E039 Hypothyroidism, unspecified: Secondary | ICD-10-CM | POA: Diagnosis present

## 2010-07-24 DIAGNOSIS — I10 Essential (primary) hypertension: Secondary | ICD-10-CM | POA: Insufficient documentation

## 2010-07-24 DIAGNOSIS — E1169 Type 2 diabetes mellitus with other specified complication: Secondary | ICD-10-CM | POA: Diagnosis present

## 2010-07-24 DIAGNOSIS — E119 Type 2 diabetes mellitus without complications: Secondary | ICD-10-CM | POA: Insufficient documentation

## 2010-07-24 DIAGNOSIS — R5383 Other fatigue: Secondary | ICD-10-CM

## 2010-07-24 LAB — BASIC METABOLIC PANEL
CO2: 28 mEq/L (ref 19–32)
Calcium: 9.3 mg/dL (ref 8.4–10.5)
Chloride: 100 mEq/L (ref 96–112)
Glucose, Bld: 250 mg/dL — ABNORMAL HIGH (ref 70–99)
Sodium: 138 mEq/L (ref 135–145)

## 2010-07-24 LAB — URINALYSIS, ROUTINE W REFLEX MICROSCOPIC
Hgb urine dipstick: NEGATIVE
Nitrite: NEGATIVE
Protein, ur: NEGATIVE mg/dL
Specific Gravity, Urine: 1.014 (ref 1.005–1.030)
Urobilinogen, UA: 0.2 mg/dL (ref 0.0–1.0)

## 2010-07-24 LAB — D-DIMER, QUANTITATIVE: D-Dimer, Quant: 0.24 ug/mL-FEU (ref 0.00–0.48)

## 2010-07-24 LAB — CARDIAC PANEL(CRET KIN+CKTOT+MB+TROPI)
CK, MB: 7.9 ng/mL (ref 0.3–4.0)
Relative Index: 2.4 (ref 0.0–2.5)

## 2010-07-24 LAB — BRAIN NATRIURETIC PEPTIDE: Pro B Natriuretic peptide (BNP): 61.9 pg/mL (ref 0.0–100.0)

## 2010-07-24 LAB — DIFFERENTIAL
Basophils Absolute: 0 10*3/uL (ref 0.0–0.1)
Basophils Relative: 0 % (ref 0–1)
Eosinophils Absolute: 0.4 10*3/uL (ref 0.0–0.7)
Monocytes Relative: 7 % (ref 3–12)
Neutrophils Relative %: 77 % (ref 43–77)

## 2010-07-24 LAB — GLUCOSE, CAPILLARY: Glucose-Capillary: 211 mg/dL — ABNORMAL HIGH (ref 70–99)

## 2010-07-24 LAB — COMPREHENSIVE METABOLIC PANEL
ALT: 20 U/L (ref 0–35)
AST: 20 U/L (ref 0–37)
Albumin: 3.1 g/dL — ABNORMAL LOW (ref 3.5–5.2)
CO2: 29 mEq/L (ref 19–32)
Chloride: 104 mEq/L (ref 96–112)
GFR calc Af Amer: >60 mL/min (ref >60)
GFR calc non Af Amer: 50 mL/min — ABNORMAL LOW (ref >60)
Potassium: 4.2 mEq/L (ref 3.5–5.1)
Sodium: 138 mEq/L (ref 135–145)
Total Bilirubin: 0.3 mg/dL (ref 0.3–1.2)

## 2010-07-24 LAB — CBC
HCT: 36.1 % (ref 36.0–46.0)
Hemoglobin: 11.5 g/dL — ABNORMAL LOW (ref 12.0–15.0)
MCHC: 31.9 g/dL (ref 30.0–36.0)
RBC: 4.22 MIL/uL (ref 3.87–5.11)

## 2010-07-24 LAB — MRSA PCR SCREENING: MRSA by PCR: NEGATIVE

## 2010-07-24 LAB — HEMOGLOBIN A1C: Hgb A1c MFr Bld: 10.2 % — ABNORMAL HIGH (ref <5.7)

## 2010-07-25 LAB — URINALYSIS, ROUTINE W REFLEX MICROSCOPIC
Glucose, UA: NEGATIVE mg/dL
Hgb urine dipstick: NEGATIVE
Ketones, ur: NEGATIVE mg/dL
Protein, ur: NEGATIVE mg/dL
Urobilinogen, UA: 0.2 mg/dL (ref 0.0–1.0)

## 2010-07-25 LAB — GLUCOSE, CAPILLARY
Glucose-Capillary: 147 mg/dL — ABNORMAL HIGH (ref 70–99)
Glucose-Capillary: 67 mg/dL — ABNORMAL LOW (ref 70–99)
Glucose-Capillary: 112 mg/dL — ABNORMAL HIGH (ref 70–99)

## 2010-07-25 LAB — URINE CULTURE: Colony Count: 8000

## 2010-07-26 DIAGNOSIS — R55 Syncope and collapse: Secondary | ICD-10-CM

## 2010-07-26 LAB — GLUCOSE, CAPILLARY
Glucose-Capillary: 118 mg/dL — ABNORMAL HIGH (ref 70–99)
Glucose-Capillary: 147 mg/dL — ABNORMAL HIGH (ref 70–99)
Glucose-Capillary: 59 mg/dL — ABNORMAL LOW (ref 70–99)

## 2010-07-26 LAB — BASIC METABOLIC PANEL
BUN: 26 mg/dL — ABNORMAL HIGH (ref 6–23)
CO2: 28 mEq/L (ref 19–32)
Chloride: 101 mEq/L (ref 96–112)
Glucose, Bld: 61 mg/dL — ABNORMAL LOW (ref 70–99)
Potassium: 3.8 mEq/L (ref 3.5–5.1)
Sodium: 137 mEq/L (ref 135–145)

## 2010-07-27 ENCOUNTER — Ambulatory Visit: Payer: Self-pay | Admitting: Endocrinology

## 2010-07-27 LAB — URINE CULTURE
Colony Count: NO GROWTH
Culture  Setup Time: 201204152230
Culture: NO GROWTH
Special Requests: NEGATIVE

## 2010-07-27 LAB — GLUCOSE, CAPILLARY: Glucose-Capillary: 179 mg/dL — ABNORMAL HIGH (ref 70–99)

## 2010-07-28 ENCOUNTER — Other Ambulatory Visit: Payer: Self-pay | Admitting: Internal Medicine

## 2010-07-28 ENCOUNTER — Ambulatory Visit (HOSPITAL_COMMUNITY)
Admission: RE | Admit: 2010-07-28 | Discharge: 2010-07-28 | Disposition: A | Discharge status: Home / Self Care | Payer: Medicare Other | Source: Ambulatory Visit | Attending: Internal Medicine | Admitting: Internal Medicine

## 2010-07-28 ENCOUNTER — Ambulatory Visit
Admission: RE | Admit: 2010-07-28 | Discharge: 2010-07-28 | Disposition: A | Discharge status: Home / Self Care | Payer: Medicare Other | Source: Ambulatory Visit | Attending: Internal Medicine | Admitting: Internal Medicine

## 2010-07-28 DIAGNOSIS — M25512 Pain in left shoulder: Secondary | ICD-10-CM
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
## 2010-08-03 ENCOUNTER — Other Ambulatory Visit: Payer: Self-pay | Admitting: Internal Medicine

## 2010-08-06 ENCOUNTER — Other Ambulatory Visit: Payer: Self-pay

## 2010-08-07 ENCOUNTER — Other Ambulatory Visit: Payer: Self-pay | Admitting: Internal Medicine

## 2010-08-10 ENCOUNTER — Encounter: Payer: Self-pay | Admitting: Cardiovascular Disease

## 2010-08-11 ENCOUNTER — Encounter: Payer: Self-pay | Admitting: Cardiovascular Disease

## 2010-08-11 ENCOUNTER — Ambulatory Visit (INDEPENDENT_AMBULATORY_CARE_PROVIDER_SITE_OTHER): Payer: Medicare Other | Admitting: Cardiovascular Disease

## 2010-08-11 DIAGNOSIS — I779 Disorder of arteries and arterioles, unspecified: Secondary | ICD-10-CM | POA: Insufficient documentation

## 2010-08-11 DIAGNOSIS — I5032 Chronic diastolic (congestive) heart failure: Secondary | ICD-10-CM

## 2010-08-11 DIAGNOSIS — I5033 Acute on chronic diastolic (congestive) heart failure: Secondary | ICD-10-CM

## 2010-08-11 DIAGNOSIS — I10 Essential (primary) hypertension: Secondary | ICD-10-CM

## 2010-08-11 DIAGNOSIS — I509 Heart failure, unspecified: Secondary | ICD-10-CM

## 2010-08-11 DIAGNOSIS — I6529 Occlusion and stenosis of unspecified carotid artery: Secondary | ICD-10-CM

## 2010-08-11 DIAGNOSIS — I2510 Atherosclerotic heart disease of native coronary artery without angina pectoris: Secondary | ICD-10-CM

## 2010-08-11 DIAGNOSIS — I495 Sick sinus syndrome: Secondary | ICD-10-CM

## 2010-08-11 DIAGNOSIS — I25810 Atherosclerosis of coronary artery bypass graft(s) without angina pectoris: Secondary | ICD-10-CM

## 2010-08-11 NOTE — Assessment & Plan Note (Signed)
The patient has had no more symptoms of bradycardia. I think beta blockers should be avoided. Her most recent episode was likely related to a reaction to Cipro. We will add this to her allergy list.

## 2010-08-11 NOTE — Patient Instructions (Signed)
Your physician recommends that you schedule a follow-up appointment in: 4 MONTHS  Your physician recommends that you continue on your current medications as directed. Please refer to the Current Medication list given to you today.   

## 2010-08-11 NOTE — Assessment & Plan Note (Signed)
Blood pressure is well controlled 

## 2010-08-11 NOTE — Assessment & Plan Note (Signed)
No angina at present. The patient is status post recent coronary bypass surgery. Will continue medical program without changes

## 2010-08-11 NOTE — Progress Notes (Signed)
HPI:  [name] is seen in hospital followup evaluation today.  She was hospitalized April 14 through the 17th for presyncope and bradycardia. The patient had started Cipro for urinary tract infection and became nauseated and weak. She had a with her beta blocker discontinued.  The patient has a history of coronary artery disease and she underwent multivessel PCI in 2009. She presented again in 2011 with a non-ST elevation infarction and was found to have severe recurrent three-vessel disease and required coronary bypass surgery. She has had no further ischemic event since that time.  She is feeling better since hospital discharge. She denies chest pain, dyspnea, palpitations, lightheadedness, or recurrent syncope. She has had some swelling in her right leg. No other complaints. She would like to go back to water aerobics.  Outpatient Encounter Prescriptions as of 08/11/2010  Medication Sig Dispense Refill  . allopurinol (ZYLOPRIM) 100 MG tablet Take 100 mg by mouth daily.        Marland Kitchen amLODipine (NORVASC) 10 MG tablet Take 10 mg by mouth daily.        Marland Kitchen aspirin 81 MG chewable tablet Chew 81 mg by mouth daily.        [name] Lancet Super Thin 30G MISC as directed.        . diclofenac (VOLTAREN) 50 MG EC tablet Take 50 mg by mouth 2 (two) times daily as needed.        . Ergocalciferol (VITAMIN D2) 400 UNITS TABS Take by mouth daily.        . furosemide (LASIX) 80 MG tablet Take 80 mg by mouth every morning.        Marland Kitchen glucose blood (ONE TOUCH ULTRA TEST) test strip 1 each by Other route 2 (two) times daily as needed. Use as instructed       . insulin NPH (HUMULIN N) 100 UNIT/ML injection Inject into the skin as directed. 70 units each am and 30 units in the evening      . iron polysaccharides (NIFEREX) 150 MG capsule Take 150 mg by mouth daily.        Marland Kitchen levocetirizine (XYZAL) 5 MG tablet Take 5 mg by mouth daily as needed. For allergies       . levothyroxine (SYNTHROID, LEVOTHROID) 100 MCG tablet Take 100  mcg by mouth daily.        Marland Kitchen losartan (COZAAR) 100 MG tablet Take 100 mg by mouth daily.        . methocarbamol (ROBAXIN) 500 MG tablet Take 500 mg by mouth every 6 (six) hours as needed.        . nitroGLYCERIN (NITROSTAT) 0.4 MG SL tablet Place 0.4 mg under the tongue as directed.        Marland Kitchen omeprazole (PRILOSEC) 20 MG capsule Take 40 mg by mouth daily.        Marland Kitchen oxycodone (OXY-IR) 5 MG capsule Take 1 capsule (5 mg total) by mouth every 6 (six) hours as needed.  40 capsule  0  . potassium chloride SA (K-DUR,KLOR-CON) 20 MEQ tablet Take 20 mEq by mouth 2 (two) times daily.        Marland Kitchen DISCONTD: aspirin 325 MG EC tablet Take 325 mg by mouth daily.        Marland Kitchen DISCONTD: citalopram (CELEXA) 10 MG tablet Take 10 mg by mouth daily.        Marland Kitchen DISCONTD: insulin NPH (NOVOLIN N) 100 UNIT/ML injection Inject 80 units every morning and 40 units in the evening  10 mL  3  . DISCONTD: pravastatin (PRAVACHOL) 40 MG tablet Take 40 mg by mouth daily.          Allergies  Allergen Reactions  . Diltiazem Hcl     REACTION: low heart rate  . Lovastatin     REACTION: rhabdo  . Metformin   . Penicillins   . Sulfonamide Derivatives     Past Medical History  Diagnosis Date  . CAD (coronary artery disease)     Multivessel s/p PCI w/DES 2008 and CABG 2011  . HTN (hypertension)   . Type II or unspecified type diabetes mellitus without mention of complication, not stated as uncontrolled   . PVD (peripheral vascular disease)   . Diverticulosis of colon   . Morbid obesity   . Gout   . Depression   . Osteoarthritis   . Osteopenia   . Hyperlipidemia   . LBP (low back pain)     Lumbar disc disease/lumbar spinal stenosis  . Disc disease, degenerative, cervical   . History of thrombocytopenia   . Anxiety   . GERD (gastroesophageal reflux disease)   . Barrett esophagus   . Gastroparesis   . Helicobacter pylori gastritis   . Allergic rhinitis   . Anemia     ROS: Negative except as per HPI  BP 110/57  Pulse 66   Ht 5\' 5"  (1.651 m)  Wt 225 lb 12.8 oz (102.422 kg)  BMI 37.58 kg/m2  PHYSICAL EXAM: Pt is alert and oriented, obese woman in NAD HEENT: normal Neck: JVP - normal, carotids 2+= with bilateral bruits left greater than right Lungs: CTA bilaterally CV: RRR without murmur or gallop Abd: soft, NT, Positive BS, no hepatomegaly Ext: no C/C/E, distal pulses intact and equal Skin: warm/dry no rash  EKG: Normal sinus rhythm 66 beats per minute, nonspecific ST abnormality  ASSESSMENT AND PLAN:

## 2010-08-11 NOTE — Assessment & Plan Note (Signed)
Her volume status appears stable. There is some swelling in the right leg and advised to compression stocking on this leg like she wears on the left. She is on daily furosemide and should continue this. Her blood pressure is well controlled today. No medical changes were made.

## 2010-08-11 NOTE — Assessment & Plan Note (Signed)
The patient has carotid bruits on exam. I reviewed her pre-CABG Dopplers and this is likely secondary to external carotid stenosis. She has no significant ICA stenosis. No need for serial followup at this time.

## 2010-08-12 NOTE — Discharge Summary (Signed)
[name], [name]               ACCOUNT NO.:  [id number]  MEDICAL RECORD NO.:  [id number]           PATIENT TYPE:  I  LOCATION:  2922                         FACILITY:  [facility]  PHYSICIAN:  [name]. Excell Seltzer, MD  DATE OF BIRTH:  [birth year]  DATE OF ADMISSION:  07/24/2010 DATE OF DISCHARGE:  07/27/2010                              DISCHARGE SUMMARY   PROCEDURES:  None.  PRIMARY FINAL DISCHARGE DIAGNOSIS:  Weakness/presyncope.  SECONDARY DIAGNOSES: 1. Bradycardia, resolved, off beta-blocker. 2. Diabetes. 3. Morbid obesity. 4. Peripheral vascular disease. 5. Hypertension. 6. Diverticulosis. 7. Gout. 8. Depression. 9. Osteoarthritis and osteopenia. 10.Hyperlipidemia. 11.Lumbar disk disease with spinal stenosis and cervical disk disease. 12.Anxiety. 13.Thrombocytopenia. 14.Barrett's esophagus. 15.History of gastroparesis. 16.History of Helicobacter pylori. 17.Allergic rhinitis. 18.Status post cholecystectomy, hysterectomy, tubal ligation and     coronary artery bypass surgery in November 2011 with left internal     mammary artery to left anterior descending artery, saphenous vein     graft to diagonal 1, saphenous vein graft to obtuse marginal 1 and     obtuse marginal 2, saphenous vein graft to posterior descending     artery. 19.Hypothyroidism. 20.Chronic diastolic congestive heart failure. 21.Status post drug-eluting stent to the left anterior descending     artery, circumflex and right coronary artery in 2009. 22.Allergy or intolerance to SHELLFISH, PENICILLIN, SULFA, CODEINE,     VICODIN, METFORMIN, LOVASTATIN, DILTIAZEM, and now BETA-BLOCKERS. 23.History of Wenckebach and first-degree atrioventricular block with     pauses up to 2.6 seconds (generally while asleep).  TIME AT DISCHARGE:  Thirty two minutes.  HOSPITAL COURSE:  [name] is a 77 year old female with known coronary artery disease.  She has been started on Cipro by Dr. Jonny Ruiz and takes multiple other  medications.  After starting the Cipro, she had problems with nausea and became weak, having an episode of presyncope.  She was admitted for further evaluation and treatment.  There was confusion with her medications as labetalol is not listed on her med list, but she had bottles of both 100 mg labetalol and 200 mg labetalol in her bag.  Initially, it was thought that we might be able to continue the lower dose of labetalol, but her heart rate dropped into the 20s overnight despite not receiving any beta-blocker after admission.  She was given 0.5 mg of atropine and monitored carefully. Her hemoglobin A1c was 10.2.  She was continued on her home medications for diabetes, but had some problems with hypoglycemia.  There is concern that she is not sticking closely to diabetic diet and this will be encouraged.  Her telemetry shows first-degree AV block as well as possible Mobitz I type 2 AV block.  She had no pauses greater than 2 seconds and most of the bradycardia occurred while she was asleep. There was no bradycardia that was clearly symptomatic.  Her blood pressure increased with the discontinuation of the beta-blockers.  This is to be followed and currently at discharge, her blood pressure systolic is in the 130s-160s.  This can be followed as an outpatient with hydralazine added in the future if she needs better blood  pressure control.  There was concern about an interaction between the ciprofloxacin and study medication, but she was in the "roll in" phase of the REVEAL study and was not taking study drug, so there is no reason to change this at this time.  On July 27, 2010, [name] felt that she was back to baseline.  She was ambulating without chest pain or shortness of breath and Dr. Excell Seltzer considered her stable for discharge, to follow up as an outpatient as scheduled.  She is currently maintaining sinus rhythm with a heart rate in the 50s and 60s.  LABORATORY VALUES:   Hemoglobin 11.5, platelets 111.  Hemoglobin A1c 10.2.  Creatinine 0.92.  LFTs within normal limits except for a slightly low albumin at 3.1.  DISCHARGE INSTRUCTIONS: 1. Her activity level is to be increased gradually. 2. She is encouraged to stick tightly to a low-sodium diabetic diet. 3. She is to follow up with Dr. Excell Seltzer on Aug 11, 2010 at 8:45 as     previously scheduled. 4. She is to follow up with Dr. Oliver Barre and with Dr. Dorris Fetch as     needed.  DISCHARGE MEDICATIONS: 1. Allopurinol 100 mg a day. 2. Robaxin 500 mg q.6 h. p.r.n. 3. Amlodipine 10 mg a day. 4. Humulin N 70 units in the morning and 30 units in the evening as     prior to admission. 5. Nu-Iron 150 mg a day. 6. Lasix 80 mg a day. 7. Oxycodone 5 mg one to two tablets every q.6 h. p.r.n., watch for     sedation. 8. Aspirin 81 mg a day. 9. Omeprazole 20 mg two capsules daily. 10.Potassium 20 mEq daily. 11.Levothyroxine 100 mcg daily. 12.Labetalol is not to be taken. 13.Cozaar 100 mg a day.     Theodore Demark, PA-C   ______________________________ [name]. Excell Seltzer, MD    RB/MEDQ  D:  07/27/2010  T:  07/28/2010  Job:  [id number]  cc:   Corwin Levins, MD  Electronically Signed by Theodore Demark PA-C on 07/28/2010 10:02:31 AM Electronically Signed by Tonny Bollman MD on 08/12/2010 10:35:24 AM

## 2010-08-24 NOTE — Assessment & Plan Note (Signed)
[facility]                            CARDIOLOGY OFFICE NOTE   Samantha, [name]                      MRN:          [id number]  DATE:08/21/2007                            DOB:          07/26/1932    [name] was seen in follow-up at the [facility] Cardiology office on  Aug 21, 2007.  [name] is a delightful 77 year old woman with  multivessel coronary artery disease with multivessel CAD, diabetes,  hypertension, dyslipidemia, and obesity.  She was hospitalized.  She  underwent multivessel stenting in January of this year.  Drug-eluting  stents were placed in the LAD, left circumflex and right coronary  artery.  She came back in with chest pain several months later in and  had a adenosine Myoview which was low risk.  She has been managed  medically and has done well in the interim.  She has had no chest pain  recently.  She has chronic stable exertional dyspnea without changes.  She denies edema, orthopnea or PND.  She has had some problems with  diarrhea that she attributes to Plavix.  She has been taking her Plavix  every other day.   MEDICATIONS:  1. Amlodipine 5 mg daily.  2. Colchicine 0.6 mg twice daily.  3. Diclofenac 75 mg twice daily.  4. Furosemide 80 mg daily.  5. glimepiride 40 mg two daily.  6. Januvia 100 mg daily.  7. Klor-Con 20 mEq twice daily.  8. Lantus insulin as directed.  9. Nexium 40 mg daily.  10.Plavix 75 mg daily.  11.Aspirin 81 mg daily.  12.Pravachol 40 mg daily.  13.Diovan 320 mg daily.   ALLERGIES:  Multiple and include SULFA, PENICILLIN, SHELLFISH,  METFORMIN, LOVASTATIN and DILTIAZEM.   PHYSICAL EXAMINATION:  GENERAL APPEARANCE:  The patient is alert and  oriented.  She is an obese woman in no acute distress.  VITAL SIGNS:  Weights 249 pounds.  Blood pressure 160/74, heart rate 64,  respiratory rate 20.  HEENT:  Normal.  NECK:  Normal carotid upstrokes without bruits.  Jugular venous pressure  normal.  LUNGS:  Clear to auscultation bilaterally.  HEART:  Regular rate and rhythm with 2/6 systolic ejection murmur along  the left sternal border.  No diastolic murmurs or gallops.  ABDOMEN:  Soft, obese, nontender no organomegaly.  EXTREMITIES:  No clubbing, cyanosis or edema.  Peripheral pulses 2+ and  equal throughout.   ASSESSMENT:  1. Multivessel coronary artery disease.  No symptoms of angina at      present.  Would recommend continuation of aspirin and Plavix      indefinitely.  I am concerned that she is only taking her Plavix      every other day since she is still in the first six months of      stenting.  I have asked her to give a trial of daily Imodium when      she takes her when she takes her Plavix and to resume taking this      on a daily basis along with aspirin.  If this does not work and  she      continues to have diarrhea, then we will consider a change to      Ticlid.  I also see that she is on colchicine and I think this      could be causing her problems with diarrhea as well.  However, I      believe this has been more of a long term medication for her.  2. Hypertension.  Continue current therapy.  Blood pressure has been      labile.  3. Dyslipidemia.  She is tolerating Pravachol.  If lipids are not      checked in Dr. Raphael Gibney office, I will need to repeat when she      returns for follow-up in four months.   Overall, I think [name] is doing well.  I would like to see her back  in follow-up in four months.     [name]. Excell Seltzer, MD  Electronically Signed    MDC/MedQ  DD: 08/21/2007  DT: 08/21/2007  Job #: [id number]   cc:   Corwin Levins, MD

## 2010-08-24 NOTE — H&P (Signed)
[name], [name]               ACCOUNT NO.:  [id number]   MEDICAL RECORD NO.:  [id number]          PATIENT TYPE:  INP   LOCATION:  1826                         FACILITY:  [facility]   PHYSICIAN:  Hollice Espy, M.D.DATE OF BIRTH:  January 29, 1933   DATE OF ADMISSION:  04/23/2007  DATE OF DISCHARGE:                              HISTORY & PHYSICAL   PRIMARY CARE PHYSICIAN:  Corwin Levins, M.D.   CONSULTATIONS:  [facility].   CHIEF COMPLAINT:  Chest discomfort.   HISTORY OF PRESENT ILLNESS:  The patient is a 77 year old African-  American female with past medical history of hypertension, obesity, and  diabetes mellitus who a month ago was having similar symptoms and her  PCP referred her to [facility] Cardiology outpatient for a stress test.  She underwent an Adenosine Cardiolite stress test and was told it was  negative.  At this time she has been well, but then starting  approximately three days ago she started having intermittent episodes of  chest discomfort described as a combination of squeezing and pressure  over her left breast radiating down her arm.  She had no associated  shortness of breath, but these symptoms continued to persist.  She felt  that they were possibly brought on by exertion, but then today they just  started when she woke up in the morning and have not subsided.  They  would not go away so she came into the emergency room.  In the emergency  room, she was given morphine and aspirin both of which did not relieve  her symptoms however receiving sublingual nitroglycerin second dose  stopped her discomfort immediately.  Her EKG and enzymes were done.  EKG  showed essentially normal sinus rhythm with low voltage QRS.  First set  of cardiac markers although not listed in MSTAT were passed on from ER  to PA, who reported that those were negative.  Currently the patient is  doing well.  She denies any headaches, vision changes, dysphagia, chest  pain,  palpitations, shortness of breath, wheeze, cough, abdominal pain,  hematuria, dysuria, constipation, diarrhea, focal extremity numbness,  weakness, or pain.   REVIEW OF SYSTEMS:  Otherwise negative.   PAST MEDICAL HISTORY:  Diabetes, obesity, hypertension, and  hyperlipidemia.  She also has a history of gout, GERD, and back pain.   MEDICATIONS:  1. Januvia 100 mg p.o. daily.  2. Actos.  3. Aspirin 81 mg p.o. daily.  4. Celexa 40 mg p.o. daily.  5. Colchicine 0.6 mg p.o. b.i.d.  6. Diclofenac 75 mg p.o. b.i.d.  7. Amaryl 8 mg p.o. daily.  8. K-Dur 10 mEq p.o. daily.  9. Lovastatin 80 mg p.o. daily.  10.She reports to be back on prednisone although this medication was      supposedly discontinued as per last discharge summary.  11.Lasix 40 mg p.o. daily.   ALLERGIES:  SHELLFISH, PENICILLIN, SULFA.   SOCIAL HISTORY:  No tobacco, alcohol, or drug use.   FAMILY HISTORY:  Noncontributory.   PHYSICAL EXAMINATION:  VITAL SIGNS:  Temperature afebrile, heart rate  65, blood pressure 120/48, respirations  18, O2 saturation 100% on room  air.  GENERAL:  She is alert and oriented x3 in no acute distress.  HEENT:  Normocephalic and atraumatic.  Mucous membranes are moist.  She  has no carotid bruits.  HEART:  Regular rate and rhythm S1 and S2.  LUNGS:  Clear to auscultation bilaterally.  ABDOMEN:  Soft, nontender, and nondistended with positive bowel sounds.  EXTREMITIES:  No cyanosis, clubbing, or edema.   LABORATORY DATA:  Sodium 138, potassium 4.4, chloride 104, bicarb 24,  BUN 17, creatinine 1.17, glucose 220.  White count 7.7, H&H 12.3 and 36,  MCV 90, platelet count 136 with no shift.  Cardiac markers for some  reason are not able to load, but according to the ER PA these are  negative.  EKG shows normal sinus rhythm.  Chest x-ray shows no evidence  of any acute disease.   ASSESSMENT:  1. Chest pressure relieved with nitroglycerin, negative EKG and      enzymes, negative  stress test one month ago.  I have discussed this      with  Cardiology and they will see and evaluate the patient      and likely will go for cardiac catheterization.  2. Diabetes mellitus.  3. Hypertension.  4. Obesity.   PLAN:  The patient will be made NPO, sliding scale only, and hold her  other medications.      Hollice Espy, M.D.  Electronically Signed     SKK/MEDQ  D:  04/23/2007  T:  04/23/2007  Job:  [id number]   cc:   Corwin Levins, MD

## 2010-08-24 NOTE — H&P (Signed)
NAMEMarland Kitchen  EVADENE, WARDRIP NO.:  [id number]   MEDICAL RECORD NO.:  [id number]          PATIENT TYPE:  EMS   LOCATION:  MAJO                         FACILITY:  [facility]   PHYSICIAN:  Christell Faith, MD   DATE OF BIRTH:  [birth year]   DATE OF ADMISSION:  03/22/2008  DATE OF DISCHARGE:                              HISTORY & PHYSICAL   Admitted to Dr. Tonny Bollman, [facility].   PRIMARY CARE PHYSICIAN:  Corwin Levins, MD   CHIEF COMPLAINT:  Chest pain.   HISTORY OF PRESENT ILLNESS:  This is a 77 year old African American  female with a history of three-vessel PCI in January 2009 who was  awakened tonight at approximately 11 p.m. from sleep with severe chest  pain.  She rated it 10/10 and it felt like being hit with a hammer  repeatedly in the chest.  Initially, she obtained no relief with 2  sublingual nitroglycerins at home and so she called 911.  In the  ambulance, she got 4 more nitroglycerins, which eventually did resolve  her pain.  She felt like the pain was associated with shortness of  breath and mild wheezing.  Of note, she does have chronic anginal chest  pain and takes up to 2-3 nitroglycerins per week.   PAST MEDICAL HISTORY:  1. Multivessel coronary artery disease treated with drug-eluting      stents to the LAD, right coronary artery, and left circumflex in      January 2009.  2. Admission for chest pain in August 2009.  Catheterization revealed      the stents are patent; however, there is some obstructive distal      and small vessel disease, which was previously noted.  3. Hypertension.  4. Diabetes mellitus, type 2.  5. Gout.  6. Morbid obesity.  7. Chronic low back pain.  8. Thrombocytopenia, evaluated by Hematology during her last      hospitalization thought to be related to medicine effects.   SOCIAL HISTORY:  Lives in [place].  She lives alone.  She is a widow.  She is a former Engineer, civil (consulting).  Has a 10-15 pack-year remote history of  smoking.  No alcohol use.   FAMILY HISTORY:  Mother died of old age, but had hypertension and  diabetes.  Father died of a stroke.   ALLERGIES:  DILTIAZEM, METFORMIN, PENICILLIN, SHELLFISH, AND SULFA.   MEDICINES:  1. Aspirin 81 mg p.o. daily.  2. Plavix 75 mg p.o. daily.  3. Benazepril 10 mg p.o. daily.  4. Pravachol 40 mg p.o. daily.  5. Glimepiride 4 mg p.o. daily.  6. Colchicine 0.6 mg p.o. b.i.d.  7. Diclofenac 75 mg p.o. b.i.d.  8. Lasix 80 mg p.o. b.i.d.  9. Nexium 40 mg p.o. daily.  10.Potassium chloride 40 mEq p.o. daily.  11.Allopurinol 100 mg p.o. daily.  12.70/30 insulin 35 units in the morning, 20 units in the evening.   REVIEW OF SYSTEMS:  Positive for shortness of breath, wheezing, chest  pain.  Otherwise, the balance of 14 systems reviewed and is negative.   PHYSICAL EXAMINATION:  VITAL SIGNS:  Temperature 97.5, pulse 54,  respiratory rate 18, blood pressure initially 187/47, repeat 207/48,  saturation 100% on 2 L.  GENERAL:  This is a very pleasant African American female who is in no  acute distress.  She is morbidly obese.  HEENT:  Pupils are round and reactive.  Sclerae are clear.  NECK:  Supple.  Neck veins are unable to be assessed.  No carotid  bruits.  Mucous membranes are moist.  No cervical adenopathy.  CARDIAC:  Normal rate, regular rhythm.  No murmurs.  No S3 or S4.  LUNGS:  Clear to auscultation bilaterally without wheezing or rales.  ABDOMEN:  Soft, nontender, obese.  EXTREMITIES:  No edema.  The right leg is slightly greater than left leg  from the knee down, this is previously been noted by her primary care  doctor.  MUSCULOSKELETAL:  Small amount of fluid around the right knee,  nontender, not warm.  NEUROLOGIC:  Awake, alert, and oriented x3.  5/5 strength in all 4  extremities.   DIAGNOSTIC TESTS:  Chest x-ray shows slight pulmonary vascular  congestion, borderline cardiomegaly, but no acute process.  EKG, sinus  rhythm, 58 beats per  minute, no ischemic changes.   LABORATORY DATA:  White blood cell 5.8, hemoglobin 11.3, platelets 85.  Sodium 133, potassium 3.9, BUN 12, creatinine 0.8, glucose 298.  Point-  of-care CK-MB 4.3.  Troponin less than 0.05.   IMPRESSION:  A 77 year old African American female with history of  coronary artery disease and percutaneous coronary interventions who now  presents with nocturnal chest pain.   PLAN:  1. Admit to telemetry bed to Dr. Excell Seltzer for rule out myocardial      infarction.  2. Continue home medicines including aspirin and Plavix.  3. We will control her blood pressure with p.r.n. IV medicines and      increase her baseline antihypertensive regimen.  4. Check D-dimer and BNP to evaluate her shortness of breath.  5. She is noted to have chronic thrombocytopenia, which is thought to      be secondary to medicines in particular colchicine and/or      diclofenac.  We will discontinue diclofenac as a scheduled medicine      and order it p.r.n.  6. Subcu Lovenox for DVT prophylaxis.     Christell Faith, MD  Electronically Signed    NDL/MEDQ  D:  03/22/2008  T:  03/22/2008  Job:  [id number]

## 2010-08-24 NOTE — Consult Note (Signed)
NAMEGILA, [name]               ACCOUNT NO.:  [id number]   MEDICAL RECORD NO.:  [id number]           PATIENT TYPE:   LOCATION:                                 FACILITY:   PHYSICIAN:  Firas N. Shadad        DATE OF BIRTH:  1932-04-24   DATE OF CONSULTATION:  11/27/2007  DATE OF DISCHARGE:                                 CONSULTATION   REASON FOR CONSULTATION:  Thrombocytopenia.   HISTORY OF PRESENT ILLNESS:  This is a 77 year old African American  female with known history of coronary artery disease as well as  diabetes, hypertension, and hyperlipidemia.  She has multivessel  coronary artery disease that is documented and followed with Dr. Excell Seltzer  from a cardiology standpoint.  The patient has had a recent  hospitalization back in February 2009.  She had presented with chest  pain and at that time, her workup was unrevealing for an acute  myocardial infarction.  The patient was discharged from the  hospitalization at that time.  Upon her discharge, her platelet count  was 118,000.  She presented again on the morning of November 27, 2007, and  she was complaining again of substernal chest pain.  The patient was  admitted on the Cardiology Service to work up her chest pain and rule  out myocardial infarction as well as consideration of stress test at  that time.  She noted that her platelet count was 90,000, repeat CBC  showed a platelet count of 83,000, and I was asked to evaluate her from  that standpoint.  Upon interrogating [name], she was asymptomatic at  this time.  She was not complaining of any chest pain or shortness of  breath.  She has never been told that she was thrombocytopenic and have  not really reported any bleeding.  Does not report any cough or  hemoptysis.  Did not report any hematochezia or melena.  Did not report  any epistaxis, and she did report one bruise in her left side of inner  thigh, but otherwise asymptomatic.   REVIEW OF SYSTEMS:  Did not report  any headaches, blurry vision, or  double vision.  Did not report any motor or sensory abnormalities,  alteration of mental status, psychiatric issues, or depression.  Did not  report any fever, chills, night sweats, or weight loss.  Did not report  any chest pain, orthopnea, or PND.  Did not report any cough,  hemoptysis, or hematemesis.  No nausea, vomiting, or abdominal pain.  No  hematochezia, melena, or other genitourinary complaints.  Rest of review  of systems is unremarkable.   PAST MEDICAL HISTORY:  Significant for coronary artery disease, she has  __________  history of hypertension, diabetes, and hyperlipidemia.  She  has also history of gout, last gout attack was in January 2009,  according to her and she has been on colchicine for the last 3 years.   OUTPATIENT MEDICATIONS:  She is on amlodipine, colchicine at 0.6 mg  twice a day, diclofenac 75 mg twice a day, furosemide, glipizide,  Januvia, K-Lor, Lantus, Nexium,  Plavix, aspirin, Pravachol, and Diovan.   ALLERGIES:  None.   FAMILY HISTORY:  Unremarkable.   SOCIAL HISTORY:  She denied any alcohol or tobacco abuse.  She has a  remote smoking history for the last 10-15 years.  She lives with her  daughter, she worked as a Engineer, civil (consulting) in the past.  She is widowed.  She has  three daughters and 1 son.   PHYSICAL EXAMINATION:  GENERAL:  Alert, awake, appeared in no distress.  VITALS SIGNS:  Blood pressure is 111/__________, pulse is 60, and  respiration 18.  She was afebrile.  HEENT:  Head is normocephalic and atraumatic.  Pupils equal, round, and  reactive to light.  Oral mucosa is moist and pink.  NECK:  Supple.  HEART:  Regular rate, S1 and S2.  LUNGS:  Clear to auscultation.  No rhonchi, wheezes, or rales to  percussion.  ABDOMEN:  Soft and nontender without hepatosplenomegaly.  EXTREMITIES:  No edema.  SKIN:  No rashes or lesions.  No petechiae or epistaxis.   Her full blood count showed a hemoglobin of 12.2, white  cells 6.0,  platelet count 83, PTT 26, PT 13.6, creatinine of 1.1, and bilirubin of  0.6.   IMPRESSION:  A 77 year old female with history of coronary artery  disease, hypertension, diabetes, and gout with diagnosis of  thrombocytopenia.  Her platelet count on admission was 92,000 and 83,000  again all within 1 day.  There is no exposure of heparin, at least from  what I can tell there was no hospitalization in the last 6 months.  The  differential diagnosis will include medication related as the most  common offender, colchicine would be my first agent of choice that I  would try to eliminate or stop for the time being.  Other agents such as  Plavix which obviously would be necessary for her from a cardiovascular  standpoint, would be hesitant to stop or withhold, and NSAIDs in  general.  I would recommend holding diclofenac, certainly can do that.  Other things in the differential, a HIT would always be a possibility  but again I think the lack of history, the fact that she has presented  with thrombocytopenia but not received really any treatments, although I  would recommend agreeing with stopping the Lovenox and getting a HIT  panel.  DIC, TTP are also extremely unlikely.   RECOMMENDATIONS:  My recommendation would be at this point, check LDH,  peripheral smear, and HIT panel.  I would stop the colchicine if at all  possible and overall I feel that this is a relatively benign finding at  least for the time being, could be an early signs of myelodysplasia.  We  will review her peripheral smear to make sure we are not dealing with  any infiltrative bone marrow process, and she will be discharged here in  the near future.  We will set up for the followup at the [facility].           ______________________________  Blenda Nicely. [facility]  Electronically Signed     FNS/MEDQ  D:  11/27/2007  T:  11/28/2007  Job:  [id number]   cc:   [name]. Excell Seltzer, MD  Corwin Levins, MD

## 2010-08-24 NOTE — Discharge Summary (Signed)
[name], [name]               ACCOUNT NO.:  [id number]   MEDICAL RECORD NO.:  [id number]          PATIENT TYPE:  INP   LOCATION:  1401                         [facility]ACILITY[facility]  [facility]   PHYSICIAN:  Bruce Rexene Edison. Swords, MD    DATE OF BIRTH:  Apr 21, 1932   DATE OF ADMISSION:  09/17/2006  DATE OF DISCHARGE:  09/20/2006                               DISCHARGE SUMMARY   DISCHARGE DIAGNOSES:  1. Myositis with elevated CK.  2. Iatrogenic bradycardia.  3. Diabetes.  4. Hypercholesterolemia.  5. Hypertension.  6. Gastroesophageal reflux disease.  7. Gout.  8. Recent upper respiratory infection treated with prednisone.  9. History of back surgery.   DISCHARGE MEDICATIONS:  1. Januvia 100 mg p.o. daily.  2. Colchicine 0.6 mg b.i.d.  3. Aspirin 81 mg daily.  4. Amaryl 8 mg daily.  5. Celexa 40 mg daily.  6. Klor-Con 10 mEq p.o. daily.  7. Lasix 40 mg p.o. daily.  8. Diclofenac 75 mg b.i.d.  9. Amlodipine 10 mg p.o. daily.  10.She will discontinue lovastatin, possibly related to elevated CK.      May need to be re-challenged with statin.  11.She will discontinue Actos.  There was some concern that this may      be contributing to nausea.  12.Discontinue diltiazem as this could be related to bradycardia.  13.Discontinue prednisone.  Currently the patient's respiratory status      is stable.   FOLLOW-UP PLANS:  Dr. Jonny Ruiz 7-10 days.   DIETARY RESTRICTIONS:  Diabetic diet.  She understands her need for a  low-calorie diet.   HOSPITAL COURSE:  The patient admitted to the hospitalist service on  September 17, 2006, with an elevated CK and weakness.  Etiology of elevated CK  is unknown.  CKs were greater than 1000.  Lovastatin was held.  The  patient was seen in consultation by cardiology, who did not think her CK  elevation was cardiac.  They did recommend outpatient Myoview given her  multiple risk factors.   Bradycardia resolved off diltiazem, bradycardia thought to be  iatrogenic.   Other  medical problems stable and can be followed up as an outpatient.   DISCHARGE LABORATORY DATA:  Urine microalbumin was normal at 0.9.  ANA  was negative.  TSH was normal at 2.246.  Lipid profile with an elevated  cholesterol of 217, triglycerides 193, LDL 136.  Sedimentation rate was  normal at 3.  Troponin I was normal at 0.03.  Last CK measurement was  September 19, 2006, and was elevated at 697 with an MB of 9.4, relative index  of 1.3.      Bruce Rexene Edison Swords, MD  Electronically Signed     BHS/MEDQ  D:  09/20/2006  T:  09/20/2006  Job:  [id number]

## 2010-08-24 NOTE — Discharge Summary (Signed)
[name], [name]               ACCOUNT NO.:  [id number]   MEDICAL RECORD NO.:  [id number]          PATIENT TYPE:  INP   LOCATION:  3733                         FACILITY:  [facility]   PHYSICIAN:  [name]. Excell Seltzer, MD  DATE OF BIRTH:  12-25-1932   DATE OF ADMISSION:  03/22/2008  DATE OF DISCHARGE:  03/25/2008                               DISCHARGE SUMMARY   PROCEDURES:  1. Cardiac catheterization.  2. Coronary arteriogram.  3. Left ventriculogram.  4. CT angiogram of the chest.   PRIMARY FINAL DISCHARGE DIAGNOSIS:  Chest pain, noncritical coronary  artery disease and cath this admission and medical therapy recommended.   SECONDARY DIAGNOSES:  1. Hypertension.  2. Status post drug-eluting stent to the left anterior descending,      circumflex and right coronary artery in January 2009.  3. Chest pain in August 2009 with medical therapy for small vessel      disease recommended.  4. Diabetes.  5. Morbid obesity.  6. Gout.  7. Chronic low back pain.  8. History of thrombocytopenia with platelet count of 95,000 at      discharge.  9. History of allergy or intolerance to SHELLFISH, PENICILLIN, SULFA,      CODEINE, VICODIN, METFORMIN, LOVASTATIN and DILTIAZEM.  10.Bradycardia while asleep secondary to [place]ke[place] with an underlying      first-degree AV block, no pauses greater than 2.6 seconds.   TIME OF DISCHARGE:  38 minutes.   HOSPITAL COURSE:  [name] is a 77 year old female with a history of  coronary artery disease.  She was awakened by chest pain and came to the  hospital where she was admitted for further evaluation.   Her cardiac markers were negative.  A cardiac catheterization which was  performed which showed a 10% and 50% stenosis in the LAD stent, third  diagonal branch which arose at the distal edge of the stent with 80-90%  stenosis at the ostium, 30% stenosis in the circumflex and 40% in the  posterolateral branch with less than 10% in-stent restenosis.  There  was  less than 10% in-stent restenosis of the RCA and 30% in the mid vessel.  Her EF was estimated at 70%.  Medical therapy was recommended to better  blood pressure control.   Overnight pauses at 2.39 and 2.54 seconds were noted.  She was  asymptomatic.  Her P to R interval was variable between 0.208 and 0.232  seconds.  She was going in and out of [place] but was asymptomatic at  many times when she did this she was asleep.  Because she is  asymptomatic and her heart rate is not sustained below 50, no further  workup is indicated at this time.   D-dimer was checked and was elevated so a CT angiogram of the chest was  performed.  The CT angiogram showed no pulmonary embolism and no acute  abnormality.  There was early healing noted in the left posterior  lateral eighth rib from a fracture which could be the source of the  patient's pain.   On March 25, 2008, she had no further  chest pain or shortness of  breath.  Her postprocedure labs were stable.  Her systolic blood  pressure was up, but her left ventricular end-diastolic pressure was  normal so she had amlodipine added to her medication regimen of 5 mg a  day.  Dr. Excell Seltzer evaluated [name] on March 25, 2008 and felt she  was stable for discharge with close outpatient followup.   DISCHARGE INSTRUCTIONS:  1. Her activity level is to include no driving for 2 days and no      lifting for a week.  2. She is to call our office for problems with the cath site.  3. She is to stick to a low-fat diabetic diet.  4. She is to follow up with Dr. Excell Seltzer on April 29, 2008 at 4:15 and      she is to follow up with Dr. Jonny Ruiz as well.   DISCHARGE MEDICATIONS:  1. Norvasc 5 mg daily  2. Allopurinol 100 mg daily.  3. Aspirin 81 mg daily.  4. Lotensin 20 mg daily.  5. Plavix 75 mg daily.  6. Colchicine 0.6 mg b.i.d.  7. Voltaren 75 mg b.i.d.  8. Lasix 80 mg once a day.  9. Amaryl 4 mg a day.  10.Insulin 70/30 35 units in the  morning, 20 units in the evening.  11.Omeprazole as prior to admission.  12.Pravastatin 40 mg daily.  13.Klor-Con 20 mEq b.i.d.Marland Kitchen  14.Celexa 40 mg daily.      Theodore Demark, PA-C      [name]. Excell Seltzer, MD  Electronically Signed    RB/MEDQ  D:  03/25/2008  T:  03/25/2008  Job:  [id number]   cc:   Corwin Levins, MD

## 2010-08-24 NOTE — Cardiovascular Report (Signed)
[name], [name]               ACCOUNT NO.:  [id number]   MEDICAL RECORD NO.:  [id number]          PATIENT TYPE:  INP   LOCATION:  3729                         FACILITY:  [facility]   PHYSICIAN:  [name]. Excell Seltzer, MD  DATE OF BIRTH:  1932-09-19   DATE OF PROCEDURE:  04/25/2007  DATE OF DISCHARGE:                            CARDIAC CATHETERIZATION   PROCEDURES:  1. Left heart catheterization.  2. Selective coronary angiography.  3. Left ventricular angiography.  4. Star Close of the right femoral artery.   INDICATIONS:  [name] is a 77 year old diabetic woman who has morbid  obesity and longstanding poorly-controlled diabetes.  She presented with  atypical chest pain.  She is ruled out for myocardial infarction.  She  has had negative stress tests, but in the setting of recurrent chest  pain and multiple poorly-controlled risk factors she was referred for  catheterization.   Risks and indications of the procedure were reviewed with the patient.  Informed consent was obtained.  The right groin was prepped, draped,  anesthetized with 1% lidocaine.  Using the modified Seldinger technique,  a 6-French sheath was placed in the right femoral artery.  Standard 6-  French Judkins catheters were used for selective coronary angiography.  An angled pigtail catheter was used for left ventriculography.  A  pullback across the aortic valve was done.  All catheter exchanges were  performed over a guidewire.  At the completion of the procedure, a Star  Close device was used to seal the femoral arteriotomy.  There were no  immediate complications.   FINDINGS:  Aortic pressure 144/64 with a mean of 96, left ventricular  pressure 143/16.   The left mainstem is angiographically normal.  It bifurcates into the  LAD and left circumflex.   The LAD is a diffusely diseased vessel.  After its most proximal  portion, it is a relatively small vessel.  Just after the first diagonal  branch there is a  long area of 80% stenosis that involves the origin of  the second diagonal.  The second diagonal is a small branch and it has a  90% stenosis in the ostium.  The remaining portions of the mid and  distal LAD have diffuse plaque but no significant stenoses.  The vessel  beyond the area of severe stenosis is relatively small.   The left circumflex is a large-caliber vessel in its proximal and  midportion.  It supplies a tiny first OM and a large second OM branch.  The proximal and midportions of the vessel have no significant disease.  The left circumflex is codominant with the right coronary artery.  In  the distal portion of the left circumflex there is a relatively focal 75-  80% stenosis.  This leads into a left posterolateral branch.  There is  no other significant angiographic stenosis throughout the left  circumflex system.   The right coronary artery is severely diseased.  There is a long segment  of severe stenosis in the proximal right coronary artery that leads into  a tight area of 95% stenosis just at the junction of  the proximal and  midvessel.  The remaining portions of the mid and distal right coronary  artery are angiographically normal.   Left ventricular function assessed by 30-degree RAO ventriculography is  normal.  The LVEF is estimated at 65-70%.   ASSESSMENT:  1. Severe three-vessel coronary artery disease.  2. Preserved left ventricular systolic function.   RECOMMENDATIONS:  [name] has multivessel disease in the setting of  longstanding diabetes.  Her morbid obesity and age are a factor in the  decision for which type of revascularization best suits her.  My initial  thoughts are that she may be a better candidate for a multivessel PCI  because I think her ability to recover from surgery is significantly  limited in the setting of her comorbidities.  I am going to review the  situation in detail with the patient and her family and will also review  her  films with colleagues.  Will resume heparin and observe her closely  while we make a decision regarding further treatment.      [name]. Excell Seltzer, MD  Electronically Signed     MDC/MEDQ  D:  04/25/2007  T:  04/26/2007  Job:  [id number]   cc:   Corwin Levins, MD

## 2010-08-24 NOTE — Assessment & Plan Note (Signed)
[facility]                            CARDIOLOGY OFFICE NOTE   Samantha, [name]                      MRN:          [id number]  DATE:05/18/2007                            DOB:          28-Jul-1932    [name] was seen in hospital follow-up at the [facility]  office on May 18, 2007.  [name] is a delightful 77 year old woman  with multivessel coronary artery disease.  She presented to the hospital  on January 12 with unstable angina and ultimately underwent diagnostic  catheterization.  She also had trouble with labile hypertension during  her hospitalization.  Her catheterization showed severe three-vessel  coronary artery disease.  After reviewing the situation with the patient  and her family we elected to move forward with multivessel PCI.  I felt  that she was extremely high risk for coronary bypass in the setting of  morbid obesity, advanced age and limited functional capacity.  PCI was  then performed on January 16 using drug-eluting stents in the LAD, left  circumflex and right coronary artery.  The patient tolerated the  procedure well and has really done well since her return home.  Her  chest pain has resolved.  Over the last 2 weeks she has developed a  persistent nonproductive cough.  She has had no fevers, chills, dyspnea,  orthopnea, PND or other complaints.  It appears that her cough has  started after being placed on benazepril.   MEDICATIONS:  1. Amlodipine 5 mg daily.  2. Benazepril 40 mg daily.  3. Colchicine 0.6 mg twice daily.  4. Diclofenac 75 mg twice daily.  5. Lasix 80 mg daily.  6. Glimepiride 4 mg two daily.  7. Januvia 100 mg daily.  8. Klor-Con 20 mEq twice daily.  9. Lantus.  10.Nexium 40 mg daily.  11.Plavix 75 mg daily.  12.Aspirin 81 mg daily.  13.Pravachol 40 mg daily.   DRUG ALLERGIES:  Are multiple and include:  1. SULFA.  2. PENICILLIN.  3. SHELLFISH.  4. METFORMIN.  5.  LOVASTATIN.  6. DILTIAZEM.   EXAMINATION:  The patient is alert and oriented, obese woman in no acute  distress.  Weight is 258, blood pressure 120/70, heart rate 65,  respiratory rate 16.  HEENT:  Normal.  NECK:  Normal carotid upstrokes without bruits.  Jugular venous pressure  is normal.  LUNGS:  Clear bilaterally with the exception of some end-expiratory  wheezing in the upper lobes.  CARDIOVASCULAR:  The heart is regular rate and rhythm without murmurs or  gallops.  ABDOMEN:  Soft, obese, nontender, no organomegaly.  EXTREMITIES:  No clubbing, cyanosis or edema.  Peripheral pulses 2+ and  equal throughout.   EKG shows normal sinus rhythm, low voltage, otherwise within normal  limits.   ASSESSMENT:  1. Multivessel coronary artery disease.  The patient is doing well      with symptom resolution after multivessel PCI.  Continue aspirin      and Plavix indefinitely.  Continue secondary risk reduction under      the care of Dr. Jonny Ruiz.  2. Hypertension.  The patient's blood pressure is optimal.  She has      developed a cough since starting back on benazepril.  I think she      has been on this medication in the past so I am not sure that this      is the culprit.  It is possible that she has developed an upper      respiratory infection, although she does have any other symptoms      associated with her cough.  I have taken liberty of asking her to      hold her benazepril and starting her on Diovan 320 mg daily.      Looking back through her chart she has been on Diovan in the past      and seems to have tolerated it reasonably well.  Will see how she      responds and at Dr. Raphael Gibney discretion she certainly could be tried      back on benazepril if he would prefer that agent.  3. Dyslipidemia.  The patient is on 40 mg of Pravachol.  Followed by      Dr. Jonny Ruiz.   For follow-up, I would like to see [name] back in 2 months to make  sure that she is doing okay and maintaining  compliance with her  antiplatelet therapy.     [name]. Excell Seltzer, MD  Electronically Signed    MDC/MedQ  DD: 05/18/2007  DT: 05/19/2007  Job #: [id number]   cc:   Corwin Levins, MD

## 2010-08-24 NOTE — Consult Note (Signed)
NAMESALINDA, [name]               ACCOUNT NO.:  [id number]   MEDICAL RECORD NO.:  [id number]          PATIENT TYPE:  INP   LOCATION:  1826                         FACILITY:  [facility]   PHYSICIAN:  Samantha Bandy, MD      DATE OF BIRTH:  1932/12/20   DATE OF CONSULTATION:  DATE OF DISCHARGE:                                 CONSULTATION   REQUESTING PHYSICIAN:  Dr. Rito [name].   PRIMARY CARE PHYSICIAN:  Dr. Jonny [name], [facility].   PRIMARY CARDIOLOGIST:  Dr. Charlton [name].   CHIEF COMPLAINT:  Chest pain.   HISTORY OF PRESENT ILLNESS:  [name] is a 77 year old African-American  female with multiple cardiovascular risk factors, including diabetes but  no known history of previous coronary disease, and multiple previous  negative stress tests, who began having intermittent chest pain last  week.  She was seen by Dr. Jonny [name], who instructed her that if the episodes  became more persistent and more severe, that she might need further  evaluation.  Over the weekend, she continued to have these episodes, and  then today the episodes became more severe than previous.  She denies  any exacerbating or alleviating factors for the chest pain, and there is  no clear exertional component.  This chest pain was eventually relieved  by nitroglycerin, after she presented to the emergency department.  She  has remained chest pain free since that time.  She denies any associated  nausea, vomiting, shortness of breath, or diaphoresis.  She denies  dyspnea on exertion, orthopnea, PND or lower extremity edema.   PAST MEDICAL HISTORY:  1. Diabetes mellitus.  2. Hypertension.  3. Hyperlipidemia.  4. Morbid obesity.  5. Status post cholecystectomy.  6. Status post hysterectomy.  7. Status post back surgery.  8. Gout.  9. Osteoarthritis.  10.Hiatal hernia and gastroesophageal reflux disease.   PREVIOUS CARDIAC STUDIES:  Multiple previous negative stress tests most  recently, reportedly in  December 2008.   MEDICATIONS:  1. Actos.  2. Aspirin.  3. Celexa.  4. Colchicine.  5. Voltaren.  6. Amaryl.  7. Potassium.  8. Lovastatin.  9. Prednisone.  10.Januvia.  11.Lasix.   SOCIAL HISTORY:  The patient lives in [place] with her daughter.  She  is a retired Public house manager.  She is a former smoker but quit over 25 years ago and  denies any alcohol or drug use.   FAMILY HISTORY:  Negative for premature coronary artery disease.  Mother  died at age 79, father died at age 38 from a stroke.   REVIEW OF SYSTEMS:  Positive for occasional changes in her vision, joint  arthralgias.  Otherwise, 10 systems reviewed and negative.  Others as  noted above in HPI.   PHYSICAL EXAMINATION:  VITAL SIGNS:  Temperature 98.6, blood pressure  137/59, pulse 65, respiratory rate 18.  GENERAL:  Patient breathing comfortably, no apparent distress.  HEENT:  Normocephalic, atraumatic, extraocular movements intact, sclerae  anicteric.  NECK:  Supple, no masses appreciated.  No carotid bruits or JVD.  CARDIOVASCULAR:  Regular rate and rhythm, normal S1 and S2.  No murmurs,  rubs or gallops.  CHEST:  Clear to auscultation bilaterally.  ABDOMEN:  Soft, nontender, morbidly obese, normal bowel sounds.  EXTREMITIES:  Warm, no edema, 2+ dorsalis pedis pulses bilaterally.  SKIN:  No rashes.  MUSCULOSKELETAL:  No joint effusions or erythemas.  NEUROLOGIC:  Alert and oriented x3, cranial nerves 2-12 grossly intact.   Chest x-ray shows no acute cardiopulmonary disease.  EKG review shows  normal sinus rhythm at 69 beats per minute, with low-voltage, likely  secondary to her obesity.   LABORATORY VALUES:  White blood cell count 7.7, hemoglobin 12.3,  platelets 136, sodium 138, potassium 4.4, chloride 104, bicarb 24, BUN  17, creatinine 1.17.  CK-MB 3.5, troponin-I 0.05.   ASSESSMENT:  A 77 year old female with multiple cardiovascular risk  factors including diabetes, with multiple previous negative stress  tests  now with recurrent chest pain.  She has no EKG changes, and her initial  cardiac enzymes are negative.  Given her recurrent chest pain despite  multiple previous negative stress tests, she likely will need cardiac  catheterization to definitively evaluate her coronary arteries.   RECOMMENDATIONS:  1. We would place her on aspirin 81 mg daily.  Given the resolution of      her chest pain, lack of EKG changes and normal cardiac enzymes, we      would not start therapeutic heparin at this time.  2. Continue therapy for her hypertension, hyperlipidemia, and      diabetes.  Check a fasting lipid profile in the      morning.  3. We will re-evaluate her in the morning, but likely she will need      cardiac catheterization to definitively address her coronary      anatomy.      Samantha Bandy, MD  Electronically Signed     JJC/MEDQ  D:  04/23/2007  T:  04/23/2007  Job:  [id number]

## 2010-08-24 NOTE — Assessment & Plan Note (Signed)
OFFICE VISIT   MARIADEL, [name] A  DOB:  01/29/1933                                        March 15, 2010  CHART #:  [id number]   HISTORY:  The patient is status post coronary artery bypass grafting x5  done by Dr. Dorris Fetch on February 16, 2010.  During the patient's  postoperative course, she did develop left lower extremity cellulitis  and was started on antibiotics.  This was improving prior to discharge  to home.  She also had positive yeast in her urine.  The patient  stabilized and was ultimately discharged to [facility] in  [place], [place].  She presents today for a followup visit.  The patient feels that she is progressing well.  She is up ambulating on  her own in her room.  She continues to work well with physical therapy.  She denies any chest pain, shortness of breath, fevers, nausea,  vomiting, or opening or drainage from any of her incision sites.  She  currently does not have a set day on which she plans to be discharged to  home.  She was unaware if we did discuss discharge planning from the  facility with.  She does notice that her left lower extremity has  improved.  She states with ambulation, she does not have any further  pain.  She plans to see Dr. Excell Seltzer on March 23, 2010.   PHYSICAL EXAMINATION:  Vital Signs:  Blood pressure 136/64, pulse 72,  respirations of 20, and O2 sats 99% on room air.  Respiratory:  Clear to  auscultation bilaterally.  Cardiac:  Regular rate and rhythm.  Chest:  Sternum is stable.  Abdomen:  Benign.  Extremities:  Nearly resolved  left lower extremity cellulitis.  Improved edema in lower extremities.  Still some mild tenderness of the left lower extremity around the ankle,  but improved.  Incisions:  The patient does have a left chest tube site  wound.  There is no cellulitis or drainage from this area.  This was  debrided.  Also, her mid distal sternal incision was debrided.  No  cellulitis or drainage noted from this site either.  Lower extremity  incisions are clean, dry, and intact and healing well.   STUDIES:  The patient had a PA and lateral chest x-ray obtained today  which shows to be stable.  No pleural effusions noted.  Mild atelectasis  at left base.  All wires intact.  No pneumothorax noted.   IMPRESSION AND PLAN:  The patient is progressing well status post  coronary artery bypass grafting.  Her cellulitis is nearly resolved.  She is instructed to continue washing her incisions and chest tube wound  using soap and water.  She is instructed if either of these areas  develop redness or drainage, she is to contact us and we will see her  sooner.  The patient is to continue ambulating 3-4 times per day.  She  is to continue using her incentive spirometer.  She is to discuss with  the facility regarding discharge planning to home from there.  If the  patient does go home, I do recommend home health physical therapy to  continue working with the patient.  She is to keep her appointment with  Dr. Excell Seltzer on March 23, 2010.  We will bring  the patient back in 2  weeks for followup of her incisions.  The patient is instructed to  contact us with any surgical issues.  She is in agreement.   Sol Blazing, PA   KMD/MEDQ  D:  03/15/2010  T:  03/16/2010  Job:  [id number]   cc:   [name]. Excell Seltzer, MD  Corwin Levins, MD

## 2010-08-24 NOTE — Cardiovascular Report (Signed)
[name], [name]               ACCOUNT NO.:  [id number]   MEDICAL RECORD NO.:  [id number]          PATIENT TYPE:  INP   LOCATION:  3733                         FACILITY:  [facility]   PHYSICIAN:  Everardo Beals. Juanda Chance, MD, FACCDATE OF BIRTH:  June 28, 1932   DATE OF PROCEDURE:  03/24/2008  DATE OF DISCHARGE:                            CARDIAC CATHETERIZATION   CLINICAL HISTORY:  Ms. Pincus is 77 year old and had 3-vessel, PCI with  drug-eluting stent in vessel in January 2009.  She also has  hypertension, diabetes, and obesity.  She is admitted with recurrent  chest pain and scheduled for evaluation with angiography.   PROCEDURE:  The procedure was performed by the right femoral arteries,  arterial sheath, and 5-French preformed coronary catheters.  A frontal  arterial puncture was performed and Omnipaque contrast was used.  The  patient tolerated the procedure well and left the laboratory in  satisfactory condition.   RESULTS:  The left main coronary artery.  The left main coronary artery  was free of significant disease.   The left anterior descending artery:  The left anterior descending  artery gave rise to 3 diagonal branch and then several septal  perforators.  The stent in the proximal LAD had less than 10% narrowing.  The third diagonal branch, which arose right at the distal edge of the  stent had an 80-90% ostial stenosis.  There was 50% narrowing in the mid  LAD at the stent.  The distal vessel was irregular and small in caliber.   The circumflex artery:  The circumflex artery was a codominant vessel  gave rise to a first small marginal branch, a large posterolateral  branch, and a large posterior descending branch.  There was 30% proximal  and 30% mid stenosis in the circumflex artery.  There was also a 40%  narrowing in the first large posterolateral branch.   The stent in the distal vessel before the second posterolateral branch  had less than 10% narrowing.   The right  coronary artery:  The right coronary artery, which is a  codominant vessel gave rise to a conus branch, a right ventricular  branch, and a posterior descending branch.  There was less than 10%  narrowing at the stent site in the proximal and ostial portion of the  right coronary artery.  There was less than 10% stenosis at the stent  site in the proximal right coronary artery.  There was 30% narrowing in  the midvessel.   The left ventriculogram:  Please note, the left ventriculogram performed  in the RAO projection showed vigorous wall motion with a small end-  systolic volume and what appeared to be a left ventricular hypertrophy.  The estimated fraction was 70%.   The aortic pressure was 171/62.  Left ventricular pressure was 171/11.   CONCLUSION:  Coronary artery status post prior percutaneous coronary  interventions with less than 10% narrowing at the PROMUS stent site in  the proximal left anterior descending and 80% narrowing in the ostium of  third diagonal branch and 50% narrowing in the mid left anterior  descending, 30% narrowing  in the proximal and 30% narrowing in the mid  circumflex artery with less than 10% narrowing at the PROMUS stent site  in the distal  circumflex artery, and 30% narrowing in the mid-right coronary with less  than 10% narrowing at the PROMUS stent in the proximal right coronary  and normal left ventricular function.   RECOMMENDATIONS:  Reassurance.  We will plan further adjustments for a  better control of her blood pressure.      Bruce Elvera Lennox Juanda Chance, MD, [facility]  Electronically Signed     BRB/MEDQ  D:  03/24/2008  T:  03/25/2008  Job:  [id number]   cc:   Corwin Levins, MD  [name]. Excell Seltzer, MD  Cardiopulmonary Laboratory

## 2010-08-24 NOTE — Discharge Summary (Signed)
[name], [name]               ACCOUNT NO.:  [id number]   MEDICAL RECORD NO.:  [id number]          PATIENT TYPE:  INP   LOCATION:  6524                         FACILITY:  [facility]   PHYSICIAN:  [name]. Excell Seltzer, MD  DATE OF BIRTH:  1932/10/16   DATE OF ADMISSION:  06/06/2007  DATE OF DISCHARGE:  06/08/2007                               DISCHARGE SUMMARY   PRIMARY CARDIOLOGIST:  [name]. Excell Seltzer, MD.   PRIMARY CARE Elara Cocke:  Corwin Levins, MD   DISCHARGE DIAGNOSIS:  Chest pain.   SECONDARY DIAGNOSES:  1. Coronary artery disease, status post stenting of the right coronary      artery, left anterior descending, and left circumflex with drug-      eluting stents April 27, 2007.  2. Hypertension.  3. Hyperlipidemia.  4. Diabetes mellitus.  5. Morbid obesity.  6. Gastroesophageal reflux disease.  7. History of normal left ventricular function.   ALLERGIES:  PENICILLIN, SULFA, SHELLFISH, METFORMIN, LOVASTATIN and  DILTIAZEM.   PROCEDURES:  Adenosine Myoview (two-day study secondary to obesity).   HISTORY OF PRESENT ILLNESS:  A 77 year old female with prior history of  CAD, status post stenting of the RCA, LAD and left circumflex with drug-  eluting stents April 27, 2007.  She did well postprocedurally;  however, on the morning of June 06, 2007, while at cardiac rehab,  prior to starting, she developed left-sided dull chest discomfort  lasting approximately 3 minutes and resolved by one sublingual  nitroglycerin.  She had no associated symptoms.  In the emergency room,  ECG showed no acute changes and she was admitted for rule-out.   HOSPITAL COURSE:  [name] ruled out for MI by cardiac markers x3.  Her  chest pain was felt to be atypical; however, given her recent stenting,  a decision was made to pursue inpatient Myoview.  Secondary to obesity,  this was a 2-day study.  She underwent a stress portion of the test this  morning, February 27, and this has shown a  fixed defect in the apical  segment of the lateral wall consistent with infarct with mild  reversibility.  EF was 64% without focal wall motion abnormalities.  Dr.  Gala Romney reviewed the imaging and felt that overall this was a low-risk  study and the patient was cleared for discharge.   DISCHARGE LABS:  Hemoglobin 13.0, hematocrit 38.0, WBC 6.8, platelets  118.  INR 1.0.  Sodium 140, potassium 4.2, chloride 103, CO2 29, BUN 20,  creatinine 0.06, glucose 287, total bilirubin 0.4, alkaline phosphatase  169, AST 83, ALT 52, total protein 6.5, albumin 3.3, calcium 9.0.  CK  248, MB 4.1, troponin-I 0.02.  TSH 3.198.   DISPOSITION:  The patient is being discharged home today in good  condition.   FOLLOW-UP PLANS AND APPOINTMENTS:  She has follow-up scheduled with Dr.  Excell Seltzer on March 30 at 8:45 a.m.  She has follow-up with Dr. Jonny Ruiz as  previously scheduled.   DISCHARGE MEDICATIONS:  1. Pravachol 40 mg nightly.  2. Norvasc 10 mg daily.  3. Nexium 40 mg daily.  4. Diovan  320 mg daily.  5. Aspirin 81 mg daily.  6. Plavix 75 mg daily.  7. Lantus 40 units nightly.  8. Potassium 40 mEq b.i.d.  9. Lasix 80 mg daily.  10.Colchicine 0.6 mg b.i.d.  11.Voltaren 75 mg b.i.d.  12.Nitroglycerin 0.4 sublingual p.r.n. chest pain.  13.Januvia 100 mg daily.  14.Glimepiride 4 mg daily.   OUTSTANDING LAB STUDIES:  None.   DURATION OF DISCHARGE ENCOUNTER:  40 minutes including physician.      Nicolasa Ducking, ANP      [name]. Excell Seltzer, MD  Electronically Signed    CB/MEDQ  D:  06/08/2007  T:  06/09/2007  Job:  [id number]   cc:   Corwin Levins, MD

## 2010-08-24 NOTE — Letter (Signed)
May 18, 2007    [facility] [employer]  ATTN:  Donnald Garre D. High, Transit Services Specialist  [id number]  [place], [place] [address]   RE:  CORINE, SOLORIO  MRN:  [id number]  /  DOB:  17-Oct-1932   Dear [employer]:   This letter is regarding a patient of mine named [employer].  I am a  cardiologist who treats her for coronary artery disease.  Ms. Naves is a  77 year old woman who has  extensive coronary artery disease, who I  recently treated with multiple stents in January of this year.  Ms.  [place] is significantly limited from her cardiac problems, high blood  pressure and longstanding diabetes.  She has a significantly limited  functional capacity and would have a very difficult time riding the  regular 6 route buses due to her disability.  If you have any other  questions about her medical problems, please feel free to contact my  office at any time.    Sincerely,      [name]. Excell Seltzer, MD  Electronically Signed    MDC/MedQ  DD: 05/18/2007  DT: 05/19/2007  Job #: [id number]

## 2010-08-24 NOTE — H&P (Signed)
[name], [name]               ACCOUNT NO.:  [id number]   MEDICAL RECORD NO.:  [id number]          PATIENT TYPE:  INP   LOCATION:  6524                         FACILITY:  [facility]   PHYSICIAN:  Rufina Falco, M.D.     DATE OF BIRTH:  March 17, 1933   DATE OF ADMISSION:  06/06/2007  DATE OF DISCHARGE:                              HISTORY & PHYSICAL   CHIEF COMPLAINT:  Chest pain.   HISTORY OF PRESENT ILLNESS:  The patient is a 77 year old woman with  past medical history significant for CAD, status post cath with stenting  of RCA, LAD, and circumflex with drug-eluting stents, diabetes mellitus,  hypertension, hyperlipidemia, morbid obesity, and remote tobacco history  presenting to ED secondary to chest pain. The patient reports she was at  cardiac rehab prior to beginning her rehab and had left-sided chest pain  that was dull, 5 out of 10, nonradiating, lasting three minutes, and was  alleviated with one sublingual nitroglycerin. The patient denies nausea,  vomiting, diaphoresis, fevers, chills, shortness of breath, cough, sick  contacts, palpitations, abdominal pain, hematuria, melena, and  hematochezia. The patient's chest pain was not associated with deep  breathing or palpation of the chest. No other complaints.   PAST MEDICAL HISTORY:  1. CAD, status post cath April 27, 2007 with stenting of the RCA,      LAD, and left circumflex with drug-eluting stents.  2. Diabetes mellitus.  3. Hypertension.  4. Hyperlipidemia.  5. Morbid obesity.  6. Gastroesophageal reflux disease.  7. The patient had 2D echo that was done September 19, 2006 with LV      systolic normal, ejection fraction equal to 55%. Left ventricle      wall thickness was mildly increased.   SOCIAL HISTORY:  The patient lives in [place] with her daughter. She  used to be a Engineer, civil (consulting). She is widowed. She has three girls and one boy who  are all healthy. She has remote tobacco history of one pack a day for 10-  15 years,  but she quit thirty years ago. She denies alcohol, illicit  drugs, or herbal medications. She does get some exercise with walking.   FAMILY HISTORY:  Her mother passed away at the age of 16. She had  diabetes. No history of MIs. Father passed away with recurrent strokes  at the age of 29. No CAD in siblings.   REVIEW OF SYSTEMS:  As seen in history of present illness.   ALLERGIES:  No contrast allergies. The patient has allergies to  PENICILLIN, SULFA, SHELLFISH, METFORMIN, LOVASTATIN, and DILTIAZEM.   HOME MEDICATIONS:  1. Nitroglycerin sublingual p.r.n.  2. Pravastatin 40 mg p.o. q. daily.  3. Amlodipine 10 mg p.o. q. daily.  4. Nexium 40 mg p.o. q. daily.  5. Diovan 320 mg p.o. q. daily.  6. Aspirin 81 mg p.o. q. daily.  7. Plavix 75 mg p.o. q. daily.  8. Lantus 40 units subcutaneously q.h.s.  9. Klor-Con 20 mEq p.o. b.i.d.  10.Lasix 80 mg p.o. q. daily.  11.Glimepiride 4 mg p.o. q. daily.  12.Januvia 100 mg p.o. q.  daily.  13.Diclofenac 75 mg p.o. b.i.d.  14.Colchicine 0.6 mg p.o. b.i.d.   PHYSICAL EXAMINATION:  VITAL SIGNS:  Temperature 97.5, pulse 65,  respiratory rate 18, blood pressure 134/66, O2 sats 99% on room air.  GENERAL: The patient is in no acute distress.  HEENT:  Normocephalic, atraumatic. PERRLA. EOMI. Oropharynx with no  erythema or exudates. Oropharynx also  moist.  NECK:  Supple. No lymphadenopathy. No jugular venous distention. No  carotid bruits. Lymphadenopathy none.  CV:  Regular rate and rhythm, S1, S2. The patient does have decreased  heart sounds.  LUNGS:  Clear to auscultation bilaterally.  SKIN:  No rashes.  ABDOMEN:  Soft, nontender. Positive bowel sounds. No guarding. No  rebound.  EXTREMITIES:  The patient has mild bilateral lower extremity edema.  NEURO:  Alert and oriented x 3. Cranial nerves are grossly intact.  Strength is 5 of 5 in upper and lower extremities. Sensation intact to  light touch.  EKG:  No real changes.   LABORATORY  DATA:  Admission labs all pending at the time of dictation,  ordered in the emergency department.   ASSESSMENT AND PLAN:  1. Chest pain, worrisome for ACS plus or minus stent re-stenosis given      recent stenting in January 2009. Will admit to observation on      telemetry, continue home medications including aspirin, Pravachol,      Norvasc, p.r.n. nitroglycerin, Diovan, Plavix, and Lasix. The      patient was also given four baby aspirin at the time of the      examination in the emergency room. Will cycle cardiac enzymes x3,      follow serial EKGs, check CBC with diff., CMET, coags, chest x-ray.      Will discuss with Dr. Excell Seltzer about stress test versus repeat cath      depending on the workup findings. Will also check a TSH.  2. Diabetes mellitus. Will continue patient on Lantus and start      sliding scale insulin, NovoLog, sensitive scale with CBGs q.a.c.      and q.h.s. We will not check a hemoglobin A1C at this point      secondary to it being checked in  January with a result of 8.2.      Will hold Januvia and glimepiride at this time.  3. Hypertension. Will continue the patient's home meds.  4. Hyperlipidemia. Continue Zocor.  5. Gastroesophageal reflux disease. Will put the patient on Protonix      instead of Nexium.  6. Prophylaxis. Will place sequential compression devices and continue      Protonix.     Rufina Falco, M.D.  Electronically Signed    JY/MEDQ  D:  06/06/2007  T:  06/06/2007  Job:  [id number]

## 2010-08-24 NOTE — Cardiovascular Report (Signed)
NAMEZAREEN, [name]               ACCOUNT NO.:  [id number]   MEDICAL RECORD NO.:  [id number]          PATIENT TYPE:  INP   LOCATION:  6526                         FACILITY:  [facility]   PHYSICIAN:  Verne Carrow, MDDATE OF BIRTH:  [birth year]   DATE OF PROCEDURE:  11/28/2007  DATE OF DISCHARGE:                            CARDIAC CATHETERIZATION   INDICATIONS:  A 77 year old African American female with history of  hypertension, uncontrolled diabetes mellitus, morbid obesity and known  coronary artery disease status post placement of three PROMUS drug-  eluting stents in the proximal RCA, proximal LAD, and mid circumflex in  January 2009.  The patient is readmitted with complaints of chest pain  and has ruled out for a myocardial infarction with serial cardiac  enzymes.   PROCEDURE:  1. Left heart catheterization.  2. Selective coronary angiography.  3. Left ventricular angiogram.   OPERATOR:  Verne Carrow, MD   FINDINGS:  1. The left main coronary artery is normal and bifurcates into the LAD      and circumflex.  2. The left anterior descending has a 30% proximal stenosis and the      stented segment in the proximal LAD shows no evidence of      restenosis.  The remainder of the LAD tapers into a small vessel      and has no significant disease.  There is a small second diagonal      vessel that has a 90% ostial stenosis that was noted at the time of      the last catheterization.  3. The circumflex has a proximal 30% stenosis, a mid 40% stenosis and      a stented segment in the distal portion of the vessel that has no      restenosis.  The distal portion of the AV groove circumflex has a      90% lesion where the vessel bifurcates and becomes smaller.  The      first obtuse marginal is a large branch that has a 40% ostial      lesion.  4. The right coronary artery has a 30% proximal lesion and a stented      segment in the proximal right coronary artery that has  no evidence      of restenosis.  There are luminal irregularities noted in the mid      and distal RCA.  5. Left ventricle:  normal systolic function with an ejection fraction      of 60-65%.  Left ventricular pressure was 188/13 with an end-      diastolic pressure of 25.   IMPRESSION:  1. Stable triple vessel coronary artery disease.  2. Chest pain.   RECOMMENDATIONS:  I recommend continued medical management which should  include aggressive diabetes therapy and aggressive control of her  systolic blood pressure which was greater than 200 during the heart  catheterization.      Verne Carrow, MD  Electronically Signed     CM/MEDQ  D:  11/28/2007  T:  11/29/2007  Job:  [id number]

## 2010-08-24 NOTE — H&P (Signed)
NAMEJONIQUA, SIDLE               ACCOUNT NO.:  [id number]   MEDICAL RECORD NO.:  [id number]          PATIENT TYPE:  EMS   LOCATION:  ED                           FAC[facility]   PHYSICIAN:  Andres Shad. Rudean Curt, MD     DATE OF BIRTH:  1933-02-18   DATE OF ADMISSION:  09/17/2006  DATE OF DISCHARGE:                              HISTORY & PHYSICAL   CHIEF COMPLAINT:  Weakness.   HISTORY OF PRESENT ILLNESS:  The patient is a 77 year old female with a  past medical history notable for diabetes mellitus.  She presented to  the emergency department today complaining of weakness for the last 2-3  days.  The weakness has been experienced throughout her body and is not  concentrated in proximal or distal muscles.  She denies any pain or  muscle tenderness.  Her only other associated symptom has been nausea,  but no vomiting as well as diminished appetite.  She denies any previous  illnesses similar to this one.   PAST MEDICAL HISTORY:  1. Diabetes.  2. Hypertension.  3. Hypercholesterolemia.  4. Gout.  5. Gastroesophageal reflux disease.  6. Recent respiratory illness that was treated with a steroid course      and antibiotics and the patient was unable to provide further      details.  7. Back surgery approximately 1 year ago.   MEDICATIONS:  1. Diltiazem 90 mg twice daily.  2. Januvia 100 mg daily.  3. Diclofenac 75 mg twice daily as needed.  4. Lasix 80 mg twice daily.  5. Actos 30 mg daily.  6. Colchicine 0.6 mg twice daily.  7. Glimepiride 8 mg daily.  8. Lovastatin 40 mg daily.  9. Aspirin 81 mg daily.  10.Potassium chloride 10 mEq daily.  11.Citalopram 40 mg daily.  12.Prilosec 20 mg daily.   ALLERGIES:  SULFA, PENICILLIN, SHELLFISH.   SOCIAL HISTORY:  No smoking, drugs, or alcohol use.   PHYSICAL EXAMINATION:  VITAL SIGNS:  Initially temperature 97.5, blood  pressure 155/56, heart rate 56 in sinus rhythm, respiratory rate 18.  Oxygen saturation 100% on nasal  cannula.  GENERAL:  The patient was an obese, elderly, African-American female who  was in no apparent distress, but she did appear weak.  HEENT:  Mucous membranes were moist.  Sclerae were anicteric.  There was  no pallor.  LUNGS:  Clear to auscultation bilaterally.  CARDIOVASCULAR:  Regular, normal S1 and S2.  No murmurs, rubs, or  gallops.  ABDOMEN:  Normal bowel sounds.  Soft, nontender, and nondistended.  No  organomegaly.  Examination limited by the patient's obesity.  EXTREMITIES:  No peripheral edema.  Skin notable for acanthosis  nigricans.   LABORATORY DATA:  Serum electrolytes were within normal limits.  Glucose  was elevated at 259, BUN 27, creatinine 0.98.  Complete blood count was  entirely within normal limits.  Urinalysis; positive for a glucose of  100 and trace ketones, otherwise negative.  Serum CK was notably  elevated at 1077, the MB fraction was slightly elevated at 9.0 and  troponin less than 0.05.  ASSESSMENT:  This is a 77 year old diabetic female who presents with  weakness in the context of apparent rhabdomyolysis.  The etiology of her  rhabdomyolysis is unclear.  It is possible that it is due to a  medication such as her Lovastatin, however, she has been on this dose  for a long time with no increases in her dose and therefore I think it  is somewhat unlikely to be the case.  Her other medications have all  been associated with rare instances of rhabdomyolysis, but none is a  likely candidate.  Her diabetes is also unlikely to be responsible for  her CK elevation.  She does have an elevated glucose and a mild  ketonuria, however, I think in the absence of large amounts of glucose  in the urine and a metabolic acidosis, the diagnosis of DKA would be  extremely unlikely.  Additionally the patient looks euvolemic which  would also be inconsistent with DKA.  The patient had a recent steroid  course and this may be contributing, however, I think steroid  myopathy  would be unlikely after a brief course of steroids and it would also be  unlikely to cause CK elevation.  Finally it is possible that the patient  has some sort of autoimmune process.  Polymyositis is worth considering,  however, on the patient's examination, she does not have focal proximal  muscle weakness, but rather symmetrical weakness throughout her body.  Neurologic examination did not reveal any objective weakness either.  Polymyalgia rheumatica would be unlikely to produce elevated CK levels.   PLAN:  1. Admit the patient to the hospital.  2. Gentle hydration as the patient's blood pressure is elevated.  3. Repeat CK level.  4. Check basic autoimmune markers such as an ANA and sedimentation      rate.  5. Continue routine medications but add an insulin sliding scale to      cover her hyperglycemia.  6. If the patient's CK continues to rise, I would consider stopping      her Lovastatin empirically, however, given the length of time she      has been on this medication, I would continue it for the time-      being.      Andres Shad. Rudean Curt, MD  Electronically Signed     PML/MEDQ  D:  09/17/2006  T:  09/17/2006  Job:  [id number]   cc:   Corwin Levins, MD  [address]  [place]  [place] [address]

## 2010-08-24 NOTE — Discharge Summary (Signed)
[name], [name]               ACCOUNT NO.:  [id number]   MEDICAL RECORD NO.:  [id number]          PATIENT TYPE:  INP   LOCATION:  6526                         FACILITY:  [facility]   PHYSICIAN:  Noralyn Pick. Eden Emms, MD, FACCDATE OF BIRTH:  31-Oct-1932   DATE OF ADMISSION:  11/27/2007  DATE OF DISCHARGE:  11/29/2007                         DISCHARGE SUMMARY - REFERRING   PRIMARY CARDIOLOGIST:  [name]. Excell Seltzer, MD   PRIMARY CARE PHYSICIAN:  Corwin Levins, MD   HEME-ONCOLOGY:  Dr. Blenda Nicely. Shadad.   DISCHARGE DIAGNOSES:  1. Chest discomfort of uncertain etiology.  2. Coronary artery disease with patent stents and normal LV function      on cardiac catheterization.  3. Thrombocytopenia of uncertain etiology.  However, Dr. Clelia Croft      comments that it could be an early sign of myelodysplasia.  4. Hypertension.  5. Hyperglycemia exacerbated by steroid use.  6. Questionable history of melena.  However, H&H remained stable.      Shellfish allergy history is noted previously.   PROCEDURES:  Catheterization on November 28, 2007, performed by Dr.  Clifton James.   BRIEF HISTORY:  [name] is a 77 year old African American female who  on the day of admission developed substernal pressure like chest  discomfort radiating to the right arm that awakened her from sleep.  It  was relieved with nitroglycerin.  She denied associated symptoms.  EMS  reported the patient was in atrial fibrillation.  However, there is no  documentation to substantiate this and on presentation she was in sinus  rhythm.   PAST MEDICAL HISTORY:  Is notable for PCI to the RCA, LAD and circumflex  with drug eluting stent in January 2008, diabetes, hypertension,  hyperlipidemia, obesity, GERD, chronic low back pain.   ALLERGIES:  Multiple allergies which include SULFA, PENICILLIN,  SHELLFISH, METFORMIN, LOVASTATIN, DILTIAZEM.   Admission weight was 104.1.  No further weights were documented.  Admission H&H was 12.9 and  38.8, normal indices, platelets 92, WBCs 6.2,  PTT 26, PT 13.6, PTT 30, PT 13.0, fibrinogen 347, D-dimer 0.7.  Sodium  138, potassium 4.3, BUN 22, creatinine 1.13, glucose 281, alkaline  phosphatase, AST and ALT were elevated at 169, 72 and 56.  CK-MBs,  relative index and troponins did not reveal myocardial function injury  pattern.  TSH was 2.697.  At the time of discharge sodium was 139,  potassium 3.3, glucose 128, BUN 24, creatinine 1.10.  H&H 13.2 and 38.4,  normal indices, platelets 145, WBCs 13.2.  EKG showed sinus bradycardia,  normal sinus rhythm, normal axis, nonspecific ST-T wave changes.   HOSPITAL COURSE:  The patient was admitted to [facility] by Dr.  Freida Busman.  In the morning she did not have any further chest discomfort but  she stated that she was very tired.  LFTs were noted to be elevated.  EKGs did not show any myocardial infarction injury pattern.  However,  with her thrombocytopenia hematology was consulted prior to  catheterization.  They recommended discontinuing colchicine if possible,  stated that Plavix would not need to be discontinued unless absolutely  okayed by cardiology.  They felt that she did not need transfusion and  checked an LDH and peripheral smear and a HIT panel.  However, at the  time of this dictation I do not see a HIT panel that was ordered on the  18th however is pending at the time of this dictation.  Dr. Clelia Croft felt  that she could proceed with cardiac catheterization.  On November 28, 2007, Dr. Clifton James performed cardiac catheterization.  This revealed  patent stents, EF was 60%.  She was noted to be hypertensive at 188/13  in her left ventricular pressures.  Dr. Clifton James recommended medical  treatment.  She was premedicated prior to her cath given her shellfish  allergy.  Post bedrest she was ambulating the halls without difficulty.  Dorian Pod, nurse practitioner, was called on the evening of the  19th secondary to a blood  sugar of 468.  She was started on IV insulin.  On review it was noted that the patient was not admitted on her home  insulin medications.  It was also felt that her blood sugars were  significantly elevated secondary to steroids prior to catheterization to  prevent allergic reaction.  Dr. Eden Emms after review felt that the  patient could be discharged home and outpatient followup with her  primary care physician.   DISPOSITION:  [name] is discharged home.  She is asked to maintain a  low-sodium heart-healthy ADA diet.  Wound care and activities are per  supplemental sheet.  Her preadmission medications remain unchanged.  These include:   MEDICATIONS:  1. Aspirin 81 mg daily.  2. Plavix 75 mg daily.  3. Pravachol 40 mg daily at bedtime.  4. Diovan 320 mg daily.  5. Lasix 80 mg daily.  6. Colchicine 0.6 b.i.d.  7. Diclofenac 75 b.i.d.  8. Glimepiride 4 mg b.i.d.  9. Omeprazole 20 mg daily.  10.Nitroglycerin 0.4 as needed.  11.Klor-Con 20 mEq daily or 2 times a day.  12.Lantus 40 units b.i.d.   She was asked to call and make followup arrangements with Dr. Jonny Ruiz, her  primary care physician, in regards to her sugars.  Consideration should  be given to discontinue colchicine.  She will follow up with Dr. Excell Seltzer  on the first available appointment February 11, 2008, at 2:15.  She will  be called if an appointment becomes available sooner than that.  At the  time of followup with Dr. Excell Seltzer since she did not have a chest x-ray  during this admission  consideration should be given to having a PA and lateral performed.  Also reviewing her LFTs given slight elevation and the patient on a  statin.  She is asked to maintain her blood pressure diary and bring all  medications and her diary to all appointments.  Discharge time 40  minutes.      Joellyn Rued, PA-C      Noralyn Pick. Eden Emms, MD, [facility]  Electronically Signed    EW/MEDQ  D:  11/29/2007  T:  11/29/2007  Job:  [id number]   cc:    [name]. Excell Seltzer, MD  Blenda Nicely. Lamarr Lulas, MD

## 2010-08-24 NOTE — Cardiovascular Report (Signed)
[name], [name]               ACCOUNT NO.:  [id number]   MEDICAL RECORD NO.:  [id number]          [name] TYPE:  INP   LOCATION:  3729                         FACILITY:  [facility]   PHYSICIAN:  [name]. Excell Seltzer, MD  DATE OF BIRTH:  [birth year]   DATE OF PROCEDURE:  DATE OF DISCHARGE:                            CARDIAC CATHETERIZATION   PROCEDURE:  PTCA and stenting of Samantha right coronary artery and LAD,  stenting of Samantha left circumflex, Star Close of Samantha right femoral artery.   INDICATIONS:  [name] is a 77 year old woman with longstanding  diabetes, morbid obesity and labile hypertension.  She presented with  unstable angina.  She underwent diagnostic catheterization 2 days  earlier that showed severe three-vessel CAD.  I had a long discussion  with [name] and her family.  We have elected to proceed with  percutaneous intervention due to high-risk features for CABG as noted  above.   Risks and indications of Samantha procedure were reviewed with [name].  Informed consent was obtained.  Samantha right groin was prepped, draped,  anesthetized with 1% lidocaine using modified Seldinger technique.  A 6-  French sheath was placed in Samantha right femoral artery without difficulty.  A 6-French JR-4 guide catheter with side holes was inserted.  Initial  angiography demonstrated severe 95% stenosis of Samantha proximal right  coronary artery.  There was a long segment of disease involved.  Angiomax was used for anticoagulation.  A Cougar guidewire was advanced  beyond Samantha area of severe stenosis without difficulty. Samantha lesion was  predilated with 2-0. x 15 mm Maverick balloon up to 10 atmospheres.  After balloon dilatation, Samantha 2.75 x 28 mm Promus stent was placed and  was deployed at 12 atmospheres.  This stent covered Samantha entire segment  of severe disease.  Samantha stent was fairly well expanded, but there was  some residual waste in Samantha distal aspect of Samantha stent.  I used a 3.0 x  20 mm Quantum  Maverick to post-dilate.  It was taken to 16 atmospheres  distally and 18 atmospheres proximally, a total of 3 inflations were  performed.  At completion of Samantha procedure, there was a nice step-down  off Samantha distal edge of Samantha stent. There was TIMI III flow and an  excellent angiographic result.  At that point, a 6-French CLS 3.5 cm  guide catheter was inserted into Samantha left main.  Samantha wire was passed  beyond Samantha LAD lesion.  There was also a relatively long segment of  disease throughout Samantha proximal LAD.  Samantha entire LAD is diffusely  diseased, but Samantha only severe stenosis was in Samantha proximal vessel.  Samantha  lesion was predilated with Samantha same 2.0 x 15 mm Maverick.  I  then  stented that area with a 2.5 x 23 mm Promus stent which was deployed at  12 atmospheres.  Samantha stent was post-dilated with a 2.5 x 15 mm Quantum  Maverick up to 16 and then 18 atmospheres.  There was some plaque  shifting into a diagonal ostium, but there was TIMI III flow  in Samantha  diagonal.  Samantha stent was well expanded.  There was a nice step-down off  Samantha distal aspect of Samantha stent.  There was no residual stenosis.  There  was TIMI III flow.   At that point, I elected to move on to Samantha circumflex.  Circumflex was  wired with Samantha same Cougar guidewire without difficulty.  I decided to  primarily stent Samantha lesion with a 2.5 x 18 mm Promus stent.  This was  deployed at 14 atmospheres.  Samantha stent had a residual waste throughout  Samantha midportion.  I post-dilated Samantha stent with a 2.75 x 12 mm Quantum  Maverick which was taken to 18 and then 20 atmospheres on a total of 3  inflations to cover Samantha entire stented segment.  There was no  significant residual stenosis.  [name] tolerated Samantha entire  procedure well.  There were no immediate complications.   At Samantha completion of Samantha procedure, I elected to deploy a Star Close  device for femoral arterial closure.  Samantha device felt as if it deployed  normally, but even after a  few minutes of manual compression, there was  still pulsatile arterial flow and we suspected Samantha device failed.  A  femstop was placed.  [name] remained stable throughout.   ASSESSMENT:  Successful three-vessel percutaneous coronary intervention  using Promus drug-eluting stents.   PLAN:  [name] should remain on aspirin and Plavix indefinitely.  Depending on whether she develops a hematoma, she should be eligible to  go home in Samantha next 24-48 hours.  Overall, I am very pleased with how  well she tolerated Samantha procedure.      [name]. Excell Seltzer, MD  Electronically Signed     MDC/MEDQ  D:  04/27/2007  T:  04/27/2007  Job:  [id number]

## 2010-08-24 NOTE — Consult Note (Signed)
[name], [name]               ACCOUNT NO.:  [id number]   MEDICAL RECORD NO.:  [id number]          PATIENT TYPE:  INP   LOCATION:  1606                         FACILITY:  [facility]   PHYSICIAN:  Gerrit Friends. Dietrich Pates, MD, FACCDATE OF BIRTH:  1933-02-05   DATE OF CONSULTATION:  09/18/2006  DATE OF DISCHARGE:                                 CONSULTATION   PRIMARY CARE PHYSICIAN:  Dr. Oliver Barre.   CARDIOLOGIST:  Tentatively is Dr. Charlton Haws although he has not seen  her in the hospital since 2005 and has never seen her in the office.   CHIEF COMPLAINT:  Elevated enzymes.   HISTORY OF PRESENT ILLNESS:  [name] is a 77 year old female who was  admitted in 2005 and 2007 for chest pain.  She had a negative Myoview at  those times. She had onset of weakness last Friday.  The onset was  gradual.  She came to the hospital and cardiology was asked to evaluate  her for elevated cardiac enzymes.   [name] states that she has not had any chest pain recently.  She  thinks the last time she had chest pain was when she had rehab after  back surgery in September of 2007.  Normally, she states, she tries to  get out and walk 2 blocks a day and the last time she did this was last  Thursday.  During those walks and with other activities around the house  she has not had chest pain.   [name] states that the onset of the weakness last Friday was gradual.  She states it became worse over the weekend to the point that yesterday  she felt unsafe walking unaided and called EMS.  She states she has not  fallen and has not dropped anything.  She states that she was hydrated  over night and felt better this morning but this p.m. when her blood  pressure was significantly elevated she once again was feeling general  malaise.   PAST MEDICAL HISTORY:  1. Diabetes.  2. Hypertension.  3. Hyperlipidemia.  4. Morbid obesity with a body mass index of 41.8.  5. Gout.  6. Osteoarthritis.  7.  Gastroesophageal reflux disease/hiatal hernia.  8. History of lumbar spinal stenosis.   PAST SURGICAL HISTORY:  She is status post back surgery in 2007 as well  as cholecystectomy and hysterectomy as well as ovarian cystectomy.   ALLERGIES:  She is allergic or intolerant to: CODEINE, PENICILLIN,  SHELLFISH AND SULFA. REPORTED HISTORY OF A CONTRAST ALLERGY, HOWEVER SHE  HAS HAD A CT OF HER ABDOMEN AND OF HER SPINE DONE WITHIN THE LAST 3  YEARS WITH NO PRE-TREATMENT AND WITHOUT COMPLICATIONS.   CURRENT MEDICATIONS:  1. Diltiazem 90 mg b.i.d.  2. Januvia 100 mg daily.  3. Voltaren 75 mg b.i.d. p.r.n.  4. Lasix 80 mg p.o. b.i.d.  5. Lactose 30 mg daily (discontinued).  6. Colchicine 0.6 mg b.i.d.  7. Amaryl 8 mg daily.  8. Lovastatin 40 mg daily (decreased to 20 mg in hospital).  9. Aspirin 81 mg daily.  10.Potassium.  11.Celexa  40 mg daily.  12.Prilosec 20 mg daily.  13.Metoprolol 25 mg p.o. b.i.d.  14.Clonidine 0.1 mg p.r.n.  15.Sliding scale insulin.   Of note, she had her first dose of Metoprolol this morning at 10 a.m.   SOCIAL HISTORY:  She lives in [place] with her daughter.  She is a  retired Public house manager  She quit tobacco 25 years ago with possibly a 15 pack year  history. Does not abuse alcohol or drugs.  She tries to exercise and  states she is sticking to a diabetic diet.   FAMILY HISTORY:  Mother died at age 6 with a history of coronary artery  disease in her later years.  Her father died at age 52 of a stroke.  She  has 8 siblings, none of whom have coronary artery disease.   REVIEW OF SYSTEMS:  She states that she felt alternately cold and hot  but was not aware of any fever.  She wears glasses but has no  significant hearing loss.  She has chronic dyspnea on exertion but does  not feel this has changed recently.  She has occasional pedal edema and  describes an occasional heart rate that feels slow to her and feels like  it is stopping at times but denies any  presyncope or syncope and states  these symptoms were before she came to the hospital.  She occasionally  wheezes when she gets a cold.  She reports some depression and increased  stress.  She has some chronic right shoulder arthralgias.  Her reflux  symptoms are well controlled on Prilosec and she denies any melena or  bright red blood per rectum. She is a full code.  Review of systems is otherwise negative.   PHYSICAL EXAMINATION:  VITAL SIGNS:  Temperature is 98.3, last recorded  blood pressure is 215/88, pulse rate 42, respiratory rate 18, O2  saturation 98% on room air.  GENERAL:  Well-developed, obese African-American female in mild  distress.  HEENT:  Normal.  NECK:  Without lymphadenopathy, thyromegaly, bruit or JVD noted although  the JVD is difficult to assess secondary to body habitus.  CARDIOVASCULAR:  Her heart is distant but regular rate and rhythm with  an S1, S2, and no significant murmur, rub, or gallop is noted.  Distal pulses are 2+ in all four extremities and no femoral bruits are  appreciated.  LUNGS:  Essentially clear to auscultation bilaterally.  SKIN:  No rashes or lesions are noted.  ABDOMEN:  Soft and nontender with active bowel sounds. No  hepatosplenomegaly by percussion.  EXTREMITIES:  No clubbing, cyanosis or edema noted.  MUSCULOSKELETAL:  No joint deformities or effusions.  No CVA tenderness.  NEURO:  She is alert and oriented, cranial nerves II-XII grossly intact  but she is unable to sit up on the bed all the way unaided.  The  weakness is generalized.   EKG:  Sinus rhythm rate 60 with less than 1 mm of J-point elevation in  the inferior leads, and no acute ischemic changes.   LABORATORY DATA:  Hemoglobin 15, hematocrit 44.3, WBC is 9.6 thousand,  platelets 168,000.  Sodium 137, potassium 3.9, chloride 105, CO2 25, BUN  22, creatinine 0.2, glucose 213.  CK 1077, then 749, then 734 with an MB of 11.2.  Myoglobin greater than 500 by point of care  markers, MB at  that time 9, and  troponin -I initially less than 0.05 by point of care markers and now  0.04.  Sed rate  was 5.  Total cholesterol 151, triglycerides 166, HDL  44, LDL 73.   IMPRESSION:  1. Possible rhabdomyolysis:  She is being treated appropriately with      IV fluids.  She had been on Lovastatin for a long time and the      dosage has been reduced.  Discontinue for now.  Check TSH.  2. Possible coronary artery disease:  Her CK-MB is elevated but the      index is within normal limits at 1.5.  Her troponin's so far are      negative.  We will repeat the cardiac enzymes now and in a.m.      Because of her sinus bradycardia, we will decrease the dose of      Metoprolol and discontinue the Clonidine.   An echocardiogram has been ordered but not yet performed. We will follow  up on those results.  Further evaluation and treatment will depend on  the results of the above testing but currently, no further inpatient  workup is planned.  She can followup as an outpatient with further  evaluation and testing determined at that time when she recovers from  the acute event.      Theodore Demark, PA-C      Gerrit Friends. Dietrich Pates, MD, [facility]  Electronically Signed    RB/MEDQ  D:  09/18/2006  T:  09/18/2006  Job:  [id number]   cc:   Corwin Levins, MD  [address]  [place]  [place] [address]

## 2010-08-24 NOTE — H&P (Signed)
[name], [name]               ACCOUNT NO.:  [id number]   MEDICAL RECORD NO.:  [id number]          PATIENT TYPE:  INP   LOCATION:  6526                         FACILITY:  [facility]   PHYSICIAN:  Brayton El, MD    DATE OF BIRTH:  01-Nov-1932   DATE OF ADMISSION:  11/27/2007  DATE OF DISCHARGE:                              HISTORY & PHYSICAL   CHIEF COMPLAINT:  Chest pain.   HISTORY OF PRESENT ILLNESS:  [name] is a 77 year old black female  with past medical history significant for coronary artery disease,  status post drug-eluting stents to the LAD, RCA, and left circumflex in  January 2009, presenting with new-onset chest discomfort this evening.  The patient states that she was awoken in the middle of the night with  substernal chest discomfort.  She describes it as severe, radiating to  the right arm.  The patient states that it was a pressure-like sensation  that was relieved by nitroglycerin.  There were no associated symptoms.  The patient states in the past few days she has been in her normal state  of health.  Specifically, she denies any lower extremity edema, PND, or  orthopnea.  EMS states that the patient was initially in atrial  fibrillation.  She converted on her own to normal sinus rhythm.   PAST MEDICAL HISTORY:  1. Coronary artery disease, status post PCI to the RCA, LAD, and left      circumflex with drug-eluting stents in January 2009.  2. Diabetes.  3. Hypertension.  4. Hyperlipidemia.  5. Obesity.  6. Gastroesophageal reflux disease.  7. Chronic low back pain, status post back surgery.   SOCIAL HISTORY:  History of tobacco use, does not smoke, does not use  alcohol.   FAMILY HISTORY:  Negative for premature coronary disease.   ALLERGIES:  The patient is allergic to sulfa, penicillin, shellfish,  metformin, lovastatin, and diltiazem.   MEDICATIONS:  1. Aspirin 81 mg daily.  2. Plavix 75 mg daily.  3. Pravachol 40 mg daily.  4. Diovan 320 mg  daily.  5. Lasix 80 mg daily.  6. Colchicine 0.6 mg b.i.d.  7. Diclofenac 75 mg b.i.d.  8. Glimepiride 4 mg b.i.d.  9. Omeprazole 20 mg daily.   REVIEW OF SYSTEMS:  Positive for black tarry stools several days ago  that lasted several days and then resolved, negative for hematochezia,  dysuria, and hematuria.  Other systems as in HPI, otherwise negative.   PHYSICAL EXAMINATION:  VITAL SIGNS:  The patient has a temperature of  97.2, pulse 59, respirations 20, blood pressure 128/65, and sating 99%  on room air.  GENERAL:  She is in no acute distress.  HEENT:  Nonfocal.  NECK:  Supple.  HEART:  Regular rate and rhythm without murmur, rub, or gallop.  LUNGS:  She has some mild crackles at the right base, otherwise clear to  auscultation bilaterally.  ABDOMEN:  Obese, soft, and nontender.  EXTREMITIES:  Without edema.  SKIN:  Warm and dry.  NEUROLOGIC:  Nonfocal.  MUSCULOSKELETAL:  The patient has 5/5 strength in bilateral upper  and  lower extremities.   LABS:  She has a troponin of less than 0.05, MB of 3.8, white count of  6, hemoglobin 13, hematocrit 39, and platelet count 92.  Of note,  platelet count in February 2009 was 118.  Her EKG taken in the emergency  room, normal sinus rhythm without any significant ST or T-wave  abnormalities.  In reviewing the EKG strips left by EMS, there is  baseline artifact; however, it does appear to be sinus rhythm with PACs  and not atrial fibrillation.   IMPRESSION:  1. Chest pain that is possibly unstable angina.  2. Thrombocytopenia with a recent history of possible melena.   PLAN:  The patient will be admitted to Telemetry Unit and ruled out for  acute myocardial infarction.  We will consider a stress test in the a.m.  We will hold her diclofenac and heme check stools x3.  We will increase  her PPI to b.i.d.  She will be placed on a sliding scale insulin while  she is n.p.o.  Her other home medications as listed above will be   continued.  If the patient has not had a workup for thrombocytopenia in  the past, this should be pursued with a possible hematology consult.      Brayton El, MD  Electronically Signed     SGA/MEDQ  D:  11/27/2007  T:  11/27/2007  Job:  [id number]

## 2010-08-24 NOTE — Discharge Summary (Signed)
NAMEESSYNCE, MUNSCH               ACCOUNT NO.:  [id number]   MEDICAL RECORD NO.:  [id number]          PATIENT TYPE:  INP   LOCATION:  6526                         FACILITY:  [facility]   PHYSICIAN:  Bruce Rexene Edison. Swords, MD    DATE OF BIRTH:  Feb 02, 1933   DATE OF ADMISSION:  04/23/2007  DATE OF DISCHARGE:  04/28/2007                               DISCHARGE SUMMARY   DISCHARGE DIAGNOSES:  1. Coronary artery disease.  2. Unstable angina.  3. Status post percutaneous transluminal coronary angioplasty and      stenting of the right coronary artery, left anterior descending and      left circumflex artery.  See cardiac catheterization report.  4. Diabetes.  5. Gout.  6. Hyperlipidemia.  7. Hypertension.  8. Morbid obesity.  9. Gastroesophageal reflux disease.   PREVIOUS SURGERY:  Include cholecystectomy, hysterectomy, back surgery.   DISCHARGE MEDICATIONS:  1. Plavix 75 mg p.o. daily.  2. Nitroglycerin 0.4 mg sublingual q.5 minutes p.r.n. chest pain up to      3 per day.  3. Lantus 40 units subcu daily.  4. Januvia 100 mg p.o. daily.  5. Amaryl 8 mg p.o. daily.  6. Aspirin 81 mg p.o. daily.  7. Colchicine 0.6 mg b.i.d. p.r.n.  8. Lasix 80 mg p.o. daily.  9. K-Dur 20 mEq p.o. daily.  10.Diclofenac 75 mg p.o. b.i.d. p.r.n.  11.Amlodipine 5 mg p.o. daily.  12.Benazepril 40 mg p.o. daily.  13.Potassium 40 mg p.o. daily.   HOSPITAL PROCEDURES:  Cardiac catheterization, PTCA, stenting, see  cardiology notes for details.  Chest x-ray on April 23, 2007, was  unremarkable.   HOSPITAL LABORATORIES:  Cardiac enzymes elevated with peak CK of 460, CK-  MB 6.2, relative index 1.3.  Lipid panel with cholesterol 161, LDL 101,  HDL 35.  Hemoglobin A1c elevated at 8.2%.  CBC was unremarkable.  BMET  at the time of discharge was significant for potassium of 3.1, glucose  149.  CBC at the time of discharge was normal except for a platelet  count of 146,000.   CONDITION ON DISCHARGE:  Improved.   The patient is ambulating without  difficulty.  No trouble at the cardiac catheterization site.   FOLLOWUP:  Dr. Noralyn Pick. Nishan and Dr. Jonny Ruiz.  The patient will see Dr.  Jonny Ruiz in 1 week.   HOSPITAL COURSE:  The patient was admitted to the hospitalist service on  April 23, 2007.  See H and P by Dr. Rito Ehrlich.  The patient was seen by  cardiology.  The patient underwent cardiac catheterization.  Cardiac  catheterization was done on April 25, 2007.  The patient underwent 3  vessel stenting on April 27, 2007.  Other medical problems remained  relatively stable in the hospital.  It is worth noting that her diabetes  is poorly controlled.  She understands the need for diabetic diet and  the importance of controlling her diabetes.      Bruce Rexene Edison Swords, MD  Electronically Signed     BHS/MEDQ  D:  04/28/2007  T:  04/28/2007  Job:  [id number]

## 2010-08-27 NOTE — Discharge Summary (Signed)
[name], [name]               ACCOUNT NO.:  [id number]   MEDICAL RECORD NO.:  [id number]          PATIENT TYPE:  INP   LOCATION:  1509                         FACILITY:  [facility]   PHYSICIAN:  Georges Lynch. Gioffre, M.D.DATE OF BIRTH:  [birth year]   DATE OF ADMISSION:  12/13/2005  DATE OF DISCHARGE:                                 DISCHARGE SUMMARY   ADMISSION DIAGNOSES:  1. Readmission for severe muscle spasms, pain.  2. Urinary retention with leaking urine, unable to void since surgical      procedure.  3. Hypertension.  4. Diabetes.  5. Ulcers.  6. Obesity.  7. Reflux disease.   DISCHARGE DIAGNOSES:  1. Status post lumbar laminectomy due to severe spinal stenosis, L2-3, L3-      4 with severe postoperative muscle spasms and pain, improved.  2. Postoperative urinary retention requiring Foley catheterization with      subsequent urinary tract infection with cultures showing Proteus      mirabilis and Citrobacter __________ .  3. Gout type complaints, bilateral feet.  4. Poor progress with physical therapy, living alone requiring skilled      nursing placement.  5. Diabetes.  6. Hypertension, improved control after controlling pain and urinary      retention.  7. History of ulcers.  8. History of reflux disease.  9. History of obesity.   HISTORY OF PRESENT ILLNESS:  The patient is a 77 year old female who was  readmitted on September 4, for severe muscle spasms, pain, urinary retention  with leaking urine, and difficulty to manage at home with family assistance.  The patient was found to have severe muscle spasms of the lower back with  pain and significant urinary retention and leaking urine.  The patient was  admitted for management of the above issues and evaluation by urology for  her urinary retention.   ALLERGIES:  CODEINE, PENICILLIN, SHELLFISH, SULFA.   MEDICATIONS:  1. Baby aspirin.  2. Furosemide 80 mg a day.  3. Robaxin 500 mg three times a day.  4. Mepergan  Fortis.  5. Clonidine HCL 0.1 mg b.i.d.  6. Lisinopril 40 mg a day.  7. Glimepiride 4 mg two tablets daily.  8. Actos 45 mg a day.  9. Colchicine 0.6 mg b.i.d.   PROCEDURE:  None, but she is status post lumbar laminectomy at L2-3, L3-4  for severe spinal stenosis on previous admission.   CONSULTATIONS:  1. The following routine consults were requested:  Physical therapy and      case management.  2. Urology consult for requested for evaluation of the patient's urinary      retention and subsequent urinary tract infection and management of her      current urologic issues.   HOSPITAL COURSE:  On December 13, 2005, the patient was readmitted to [facility] under the care of Georges Lynch. Gioffre, M.D. for severe  lower back spasms, pain, and urinary retention.  A urology consult was  requested and Foley catheter was in place.  She was placed on Urecholine.  Cultures were sent.  The patient's severe  muscle spasms was managed with IV  pain medicines and Valium p.o.  The patient's severe spasms did gradually  improve over several days as did her pain significantly improve.  The  patient's urologic issues were managed with an indwelling Foley catheter  which was to be left in place until outpatient follow-up was performed.  She  was placed on Urecholine and cultures came back Proteus mirabilis and  Citrobacter.  She was placed on Cipro for treatment of this issue.  The  patient was also found to have some mild hypokalemia and she was placed on  p.o. potassium for this issue.  The patient had no other significant  untoward events throughout her hospitalization with the exception of a flare-  up of the typical gouty attacks that she has in her feet. They are becoming  very sensitive, very painful, making it difficult for her to ambulate.  So  her Colchicine was increased to 0.6 mg t.i.d. and she was started on  Indomethacin 75 SR one tablet twice a day.  This did gradually  improve, but  the patient still had a significant amount of difficulty with ambulation and  was felt not to be safe to be discharged home on her own at this particular  point.  She had no family members and she would have to care for herself.  Arrangements were made and skilled nursing facility placement was  recommended and this was approved.   The patient's significant back spasms and pain has significantly improved  with management of p.o. medicines at this particular point.  She is  neurologically intact.  She has no problems with her back wound.  She does  have an indwelling catheter.  She is on antibiotics for the infection and  she is still on Urecholine and we will have arrangements made for her to  follow up with urology at their recommendations after discharge to skilled  nursing facility for further recommendations and treatments.  We will not  remove the Foley catheter prior to her going to the skilled nursing  facility.  We will continue all gouty medications as dispensed at this  particular time.  It was felt that the patient was medically stable and  ready for discharge to skilled nursing facility for continued rehab and care  until she is independent where she can be returned to home.   LABORATORY DATA:  Urine culture on August 31, found Citrobacter and a repeat  urine culture on September 9, found Proteus mirabilis.  CBC performed on  September 7, found WBC's 12.8 which is improved from 14.4 the day previous  on antibiotics.  Hemoglobin 10.4, hematocrit 30.9, platelets 274.  September  6, found potassium of 3.0, glucose 105, all others within normal limits.  This has not been repeated.  She was placed on p.o. K-Dur 20 mEq p.o. b.i.d.   DISCHARGE MEDICATIONS:  1. Lisinopril 40 mg a day.  2. Valium 10 mg p.o. q.12 hours.  3. Aspirin 81 mg a day.  4. Lasix 80 mg a day.  5. Catapres 0.1 mg p.o. b.i.d.  6. Amaryl 8 mg a day.  7. Actos 40 mg a day. 8. Urecholine 25 mg  p.o. t.i.d.  9. Potassium chloride 20 mEq p.o. b.i.d.  10.Cipro 500 mg p.o. b.i.d.  11.Colchicine 0.6 mg p.o. t.i.d.  12.Indomethacin 75 mg SR p.o. b.i.d.  13.Dilaudid 2-4 mg p.o. q.6 hours p.r.n.   DISCHARGE INSTRUCTIONS:  The patient is maintained on a low concentrated  strict  diabetic diet.  The patient is to be up with physical therapy on a  daily basis.  She may be weightbearing as tolerated.   WOUND CARE:  The patient should have wound checked for any signs of  infection on a daily basis and dressing is to be changed daily.   MEDICATIONS:  1. Cipro 500 mg p.o. b.i.d.  2. Urecholine 25 mg p.o. t.i.d.  3. Dilaudid 2 mg 1-2 tablets every 6 hours p.r.n.  4. Valium 10 mg p.o. b.i.d.  5. Indocin 75 SR one tablet p.o. b.i.d. with food.  6. Colchicine 0.6 mg p.o. t.i.d.  7. K-Dur 20 mEq p.o. b.i.d.  8. Lasix 80 mg p.o. daily.  9. Clonidine 0.1 mg p.o. b.i.d.  10.Lisinopril 40 mg p.o. daily.  11.Glimepiride 8 mg p.o. daily.  12.Actos 45 mg a day.   FOLLOWUP:  The patient needs follow up with Dr. Darrelyn Hillock in his office 2  weeks from discharge from [facility], please call [phone]  [phone] for this appointment.   Follow up with urology, Lucrezia Starch. Earlene Plater, M.D., this week for an appointment,  [phone].   CONDITION ON DISCHARGE:  Improved, good.      Jamelle Rushing, P.A.    ______________________________  Georges Lynch Darrelyn Hillock, M.D.    RWK/MEDQ  D:  12/19/2005  T:  12/19/2005  Job:  [id number]   cc:   Windy Fast L. Earlene Plater, M.D.  Fax: [phone]

## 2010-08-27 NOTE — H&P (Signed)
NAMESANYLA, SUMMEY               ACCOUNT NO.:  [id number]   MEDICAL RECORD NO.:  [id number]          PATIENT TYPE:  EMS   LOCATION:  MAJO                         FACILITY:  [facility]   PHYSICIAN:  Sean A. Everardo All, M.D. [facility] BIRTH:  [birth year]   DATE OF ADMISSION:  05/17/2005  DATE OF DISCHARGE:                                HISTORY & PHYSICAL   REASON FOR ADMISSION:  Chest pain.   HISTORY OF PRESENT ILLNESS:  77 year old woman with several hours of severe  substernal chest pain which appears to have improved with nitroglycerin.  She has some associated non-vertiginous quality dizziness and some excessive  diaphoresis.  She states the chest pain radiated to the left arm and is now  resolved.   PAST MEDICAL HISTORY:  No known heart disease.  She has a history of  diabetes and hypertension.   MEDICATIONS:  Glyburide and Zestril at uncertain dosages.  She does not  smoke.   SOCIAL HISTORY:  The patient is widowed.  She is retired.  Her daughter is  with her.   FAMILY HISTORY:  Both parents had uncertain type of heart disease.   REVIEW OF SYSTEMS:  She had slight headache after the nitroglycerin.  She  also has slight chronic abdominal pain but she denies the following:  Nausea, vomiting, loss of consciousness, rectal bleeding, hematuria,  diarrhea, fever, dysuria, visual loss, shortness of breath, and skin rash.   PHYSICAL EXAMINATION:  VITAL SIGNS:  Blood pressure 160/73, heart rate 57,  respiratory rate 20, temperature 98.3.  GENERAL:  No distress.  SKIN:  Not diaphoretic.  HEENT:  No proptosis.  No periorbital swelling.  Pharynx:  No erythema.  Mucous membranes slightly dry.  NECK:  Supple.  CHEST:  Clear to auscultation.  Nontender to touch.  CARDIOVASCULAR:  No JVD.  No edema.  Regular rate and rhythm.  No murmur.  Pedal pulses are intact.  BREASTS:  Not done at this time due to patient condition.  GYNECOLOGIC:  Not done at this time due to patient condition.  RECTAL:  Not done at this time due to patient condition.  ABDOMEN:  Soft, obese, nontender.  No hepatosplenomegaly.  No mass.  The  obesity limits the examination.  EXTREMITIES:  No deformity is seen.  NEUROLOGIC:  Alert.  Well oriented.  Does not appear anxious nor depressed.  Cranial nerves appear to be intact and she readily moves all fours.   LABORATORIES:  Electrocardiogram shows no acute changes and glucose 317.  Myoglobin, CPK, and troponin are all normal.   IMPRESSION:  1.  Chest pain of uncertain etiology.  2.  Type 2 diabetes for which she is on an uncertain dosage of Glyburide.  3.  Hypertension for which she is on an uncertain dosage of Zestril.   PLAN:  1.  Finish cardiac enzymes.  2.  Symptomatic therapy.  3.  Hold Glyburide for staging reasons.  4.  Check glucoses and get p.r.n. insulin.  5.  Beta blockade.  6.  p.r.n. sublingual nitroglycerin.  7.  Will get Zestril at an empiric dosage.  8.  I discussed code status with patient.  She requests DNR.           ______________________________  Cleophas Dunker. Everardo All, M.D. [facility]     SAE/MEDQ  D:  05/17/2005  T:  05/17/2005  Job:  [id number]   cc:   Corwin Levins, M.D. [facility]  [address]  [place]  [place] [address]

## 2010-08-27 NOTE — H&P (Signed)
[name], [name]               ACCOUNT NO.:  [id number]   MEDICAL RECORD NO.:  [id number]           PATIENT TYPE:   LOCATION:                                 FACILITY:   PHYSICIAN:  Georges Lynch. Gioffre, M.D.DATE OF BIRTH:  12/27/1932   DATE OF ADMISSION:  12/08/2005  DATE OF DISCHARGE:                                HISTORY & PHYSICAL   CHIEF COMPLAINT:  Lower back and bilateral leg pain.   HISTORY OF PRESENT ILLNESS:  The patient is a 77 year old female who has  been evaluated by Dr. Darrelyn Hillock for lower back and bilateral posterior thigh  pain for a period of time.  She has been evaluated with MRI and CT myelogram  that shows that she has some significant spinal stenosis at L2-3 and 3-4.  Patient has been evaluated, long discussions with Dr. Darrelyn Hillock about surgical  and nonsurgical type treatments.  The patient would like to proceed with a  surgical decompression for her current complaints.   PAST MEDICAL HISTORY:  1. Diabetes.  2. Hypertension.  3. Ulcers.  4. Obesity.  5. Reflux disease.   CURRENT MEDICATIONS:  Prilosec, aspirin, diclofenac, lisinopril, Lipitor,  colchicine, Lasix, __________, Amaryl, Actos.   ALLERGIES:  1. CODEINE.  2. PENICILLIN.  3. SHELLFISH.  4. SULFA.   SOCIAL HISTORY:  Patient is a widow.  She is postoperatively going to be  living with her granddaughter who is going to take care of her.  She is  retired.  She does not smoke or use alcohol.  She does ambulate with a cane  and she lives in a one floor house.   PRIMARY CARE PHYSICIAN:  Corwin Levins, MD.   PAST SURGICAL HISTORY:  1. Hysterectomy.  2. Cholecystectomy.   COMPLICATIONS:  None.   FAMILY MEDICAL HISTORY:  Both parents are deceased, mother from age but with  a history of hypertension and diabetes, father from a stroke.   REVIEW OF SYSTEMS:  Is generally negative for any cardiac, respiratory, GI,  GU.  She does have painful great toes bilaterally with onychomycotic type  toenails with overgrowth of the toenail material which will be taken care of  after her back surgery.  Her reflux is currently well controlled with over-  the-counter Prilosec.  Her blood sugars run normally in the 90-120 range.   PHYSICAL EXAM:  VITALS:  Height is 5 feet 7 inches, weight is 240 pounds.  Blood pressure is 160/80.  She has been in a lot of pain and a lot of  emotional stress recently.  Pulse is 74, respirations 12.  The patient is  afebrile.  The patient is in no acute distress other than the discomfort  with transitioning from sitting to standing.   PHYSICAL EXAM:  Patient is an obese black female.  She does ambulate with  the use of a cane.  She has difficulty transitioning from the chair to the  exam table.  She has lower back and thigh pain.  HEENT:  Head was normocephalic, pupils equal, round and reactive.  Dentition  was in fair repair, she was  missing multiple teeth.  NECK:  Supple, no palpable lymphadenopathy, no bruits.  CHEST:  Lung sounds were clear and equal bilaterally.  No wheezes, rales or  rhonchi.  HEART:  Regular rate and rhythm, no murmurs, rubs or gallops.  ABDOMEN:  Round, obese, soft, nontender, bowel sounds normoactive.  EXTREMITIES:  Upper extremities had full range of motion, symmetrically  sized and shaped, 5/5 motor strength.  LOWER EXTREMITIES:  Right and left hip had full extension, flexion up to 110  degrees, good internal and external rotation, both knees have full  extension, flexion back to 95 degrees.  Ankles are symmetric with good  dorsoplantar flexion.  NEURO:  Patient had a significant amount of discomfort with range of motion  of her lower back with pain going down into both buttocks and down to  posterior thighs.  She had no numbness or tingling in the feet, she had good  sensation symmetrical right and left.  She had 5/5 motor strength at great  toe, ankle and knee extensions, hip flexors were about a 4/5 bilaterally.  GI/GU:   Was deferred at this time.   IMPRESSION:  1. Severe spinal stenosis at L2-3, L3-4.  2. Diabetes.  3. Hypertension.  4. Reflux disease.  5. History of gout.  6. Hyperlipidemia.  7. Obesity.   PLAN:  Patient will be admitted to [facility] on August 30th for a  planned central decompression at L2-3 and 3-4 by Dr. Darrelyn Hillock.  Patient has  been evaluated by her primary care physician and has been authorized for  clearance for this upcoming surgical procedure.  The patient will undergo  all routine labs and tests prior to having the central decompression  performed.      Jamelle Rushing, P.A.    ______________________________  Georges Lynch Darrelyn Hillock, M.D.    RWK/MEDQ  D:  11/28/2005  T:  11/28/2005  Job:  [id number]   cc:   Windy Fast A. Darrelyn Hillock, M.D.  Fax: [phone]

## 2010-08-27 NOTE — Discharge Summary (Signed)
[name], [name]               ACCOUNT NO.:  [id number]   MEDICAL RECORD NO.:  [id number]          PATIENT TYPE:  INP   LOCATION:  1604                         FACILITY:  [facility] ([place])   PHYSICIAN:  Georges Lynch. Gioffre, M.D.DATE OF BIRTH:  1933/04/01   DATE OF ADMISSION:  12/08/2005  DATE OF DISCHARGE:  12/11/2005                                 DISCHARGE SUMMARY   ADMISSION DIAGNOSES:  1. Severe spinal stenosis at L2-L3, L3-L4.  2. Diabetes.  3. Hypertension.  4. Reflux disease.  5. History of gout.  6. Hyperlipidemia.  7. Obesity.   DISCHARGE DIAGNOSES:  1. Decompressive lumbar laminectomy at L2-L3, L3-L4.  2. Postoperative muscle spasms.  3. Diabetes.  4. Hypertension.  5. Reflux disease.  6. History of gout.  7. History of hyperlipidemia.  8. Obesity.   HISTORY OF PRESENT ILLNESS:  The patient was a pleasant, 77 year old female  evaluated by Dr. Darrelyn Hillock for lower back, bilateral posterior thigh pain.  __________ evaluation with MRI  and CT myelogram showed spinal stenosis at  L2-3 and L3-4.  The patient elected to proceed with surgical decompression  having failed conservative treatment.   ALLERGIES:  CODEINE, PENICILLIN, SHELLFISH AND SULFA.   MEDICATIONS:  Prilosec, aspirin, diclofenac, __________, Lipitor,  colchicine, Lasix, Amaryl, and Actos.   OPERATIVE PROCEDURE:  On December 08, 2005, the patient was taken to the OR by  Dr. Darrelyn Hillock, assisted by Dr. Simonne Come.  Under general anesthesia, the  patient underwent a lumbar decompressive laminectomy at L2-3 and L3-4. The  patient tolerated the procedure well.  There were no complications.  The  estimated blood loss was __________ mL.  The patient was transferred to the  recovery room in good condition.   CONSULTS:  The following consults were requested:  1. Physical therapy and case management.  2. [facility] consult was also requested due to the      patient's postoperative hypertensive state for  evaluation and      management.   HOSPITAL COURSE:  On December 08, 2005, the patient was taken to the operating  room where decompressive lumbar laminectomy at L2-3 and L3-4 was performed  without any complications.  The patient tolerated the procedure well and was  transferred to the recovery room and then to the orthopedic floor in good  condition for routine postoperative care.  The following day, the patient  was found to have __________ hypertension.  Her systolic pressures were 210  to 200/70.  The patient was complaining of headaches.  __________ .  The  patient appeared to be neurologically stable at the present time, so an  internal medicine consultation was requested for evaluation of the patient's  hypertensive issues.  The patient was found to be neurologically intact by  medical services and adjustment of her medications was performed.  This was  monitored over the next 24 hours with good improvement of her blood  pressure.  Her lower back wound was benign.  The lower left extremities were  neurologically intact, moving without difficulty.  Over the next 24 hours,  the patient's blood pressure did improve.  The patient's only complaints  were sore back with muscle spasms.  She was placed on IV and p.o.  medications for current issues.   On postoperative day #3, the patient was complaining of muscle spasms in the  back and legs.  She was instructed to continue with muscle relaxants and the  current pain medications.  She was neurologically intact in both back and  lower extremities.  She was to be evaluated by general medicine prior to  being discharged.  They did increase her medications and added clonidine 0.1  mg b.i.d. and they felt she was able to be discharged home and followed in a  week or two by the medical services so the patient was discharged to home in  good condition.   LABORATORY DATA:  CBC on December 09, 2005 revealed __________, hemoglobin and  hematocrit  11.0 and 35.0, platelets 226,000.  Routine chemistries on December 09, 2005 found sodium 138, potassium 3.7, glucose 213. This was felt to be  postoperative stress, inactivity, currently being managed on __________ oral  medications with adjustments by internal medicine.  BUN 9, creatinine 0.8.  Her postoperative urinalysis was unremarkable, all values within normal  limits.  She was found to have greater than 100,000 colonies of Citrobacter  freundii and __________.  She was placed on, postoperatively, vancomycin  intravenously.   DISCHARGE MEDICATIONS:  1. Actos 45 mg a day.  2. Amaryl  8 mg a day.  3. Colchicine 0.6 mg orally b.i.d..  4. Lasix 80 mg a day.  5. Zocor 40 mg a day.  6. Protonix 80 mg a day.  7. Darvocet 1 or 2 tablets every 4-6 hours as needed was discontinued and      increased to [facility] 1 or 2 tablets every 4-5 hours.  8. Laxative/enema of choice p.r.n.  9. Postoperative IV vancomycin for 48 hours.  10.Robaxin 500 mg IV or orally every 6 hours.  11.Insulin sliding scale per hospital protocol.  12.Tylenol 650 mg orally q 4 hours as p.r.n.  13.Clonidine 0.1 mg orally twice a day.   DISCHARGE INSTRUCTIONS:  1. Diet: Patient is to be on a diabetic diet.  2. Activities: The patient is walk with assistance and use of a walker.  3. Wound care: The patient is to change dressing daily.  4. Patient is to resume home medications.   FOLLOW UP:  The patient is to follow up with Dr. Darrelyn Hillock in two weeks from  discharge.  The patient is to call for an appointment.  The patient is to  make arrangements for follow up appointment with primary care physician for  evaluation of medical issues in one or two weeks from discharge per  instructions given verbally by internal medicine evaluation.   CONDITION ON DISCHARGE:  Improved.      Jamelle Rushing, P.A.    ______________________________ Georges Lynch Darrelyn Hillock, M.D.    RWK/MEDQ  D:  12/28/2005  T:  12/29/2005  Job:   [id number]   cc:   Windy Fast A. Darrelyn Hillock, M.D.  Fax: [phone]

## 2010-08-27 NOTE — Discharge Summary (Signed)
[name], [name]               ACCOUNT NO.:  [id number]   MEDICAL RECORD NO.:  [id number]          PATIENT TYPE:  INP   LOCATION:  4707                         FACILITY:  [facility]   PHYSICIAN:  Rene Paci, M.D. LHCDATE OF BIRTH:  12/16/1932   DATE OF ADMISSION:  05/17/2005  DATE OF DISCHARGE:  05/18/2005                                 DISCHARGE SUMMARY   DISCHARGE DIAGNOSES:  1.  Atypical chest pain.  2.  Diabetes type 2.  3.  Obesity.  4.  Hypertension.   HISTORY OF PRESENT ILLNESS:  The patient is a 77 year old female who  presented with complaints of substernal chest pain for several hours which  improved with nitroglycerin.   The patient underwent serial cardiac enzymes, which were negative.  Chest  pain resolved.  She was arranged for an outpatient Myoview on February 15 at  [facility] and was instructed and educated on recommendations for  weight loss.   PERTINENT LABORATORIES AT DISCHARGE:  Hemoglobin 13, hematocrit 38.3, BUN  25, creatinine 0.9.   MEDICATIONS:  1.  Aspirin 81 mg p.o. daily.  2.  Lisinopril 20 mg p.o. daily.  3.  Glyburide.  The patient is to resume the same dose as prior to      admission.   FOLLOW-UP:  The patient is scheduled for a follow-up stress test on February  at 15 8:15 a.m.  She is also scheduled to follow up with Dr. Oliver Barre on  February 21 at 2:45 p.m.  She is instructed to call Dr. Jonny Ruiz or go to the ER  if she should develop chest pain.      Melissa S. Peggyann Juba, NP      Rene Paci, M.D. [facility]  Electronically Signed    MSO/MEDQ  D:  06/29/2005  T:  07/01/2005  Job:  [id number]   cc:   Corwin Levins, M.D. [facility]  [address]  [place]  [place] [address]

## 2010-08-27 NOTE — H&P (Signed)
NAME:  [name], [name]                         ACCOUNT NO.:  [id number]   MEDICAL RECORD NO.:  [id number]                   PATIENT TYPE:  INP   LOCATION:  0101                                 FACILITY:  [facility]   PHYSICIAN:  Sean A. Everardo All, M.D. [facility]           DATE OF BIRTH:  23-May-1932   DATE OF ADMISSION:  05/30/2003  DATE OF DISCHARGE:                                HISTORY & PHYSICAL   REASON FOR ADMISSION:  Chest pain.   HISTORY OF PRESENT ILLNESS:  The patient is a 77 year old woman with a 12-  hour history of intermittent moderate dull quality left anterior chest pain  with associated nausea.  It is worsened by exertion and relieved by rest.  She does not have it at this moment.   PAST MEDICAL HISTORY:  1. Type 2 diabetes.  2. Osteoarthritis.  3. Gout.  4. Hypertension.  5. Cholecystectomy and hysterectomy many years ago.   MEDICATIONS:  1. Darvon.  2. Naprosyn.  3. Avandia.  4. Glucophage.  5. Lasix 80 mg daily.  6. Blood pressure pill, the name of which the patient does not know.  (Dosages of most of the above medications are not known.)   SOCIAL HISTORY:  The patient neither smokes nor drinks.  She is retired, and  she is widowed.   FAMILY HISTORY:  The patient's mother had heart disease at an advanced age.   REVIEW OF SYSTEMS:  Denies the following - vomiting, excessive diaphoresis,  fever, chills, cough, dysuria, blurry vision, skin rash, edema, headache,  loss of consciousness, and without.   PHYSICAL EXAMINATION:  VITAL SIGNS:  Blood pressure is 151/68, heart rate  63, respiratory rate 16.  The patient is afebrile.  GENERAL:  In no distress.  She is morbid obese.  SKIN:  Not diaphoretic.  HEENT:  There is no scleral icterus.  Pharynx is clear.  NECK:  Supple.  CHEST:  Clear to auscultation.  CARDIOVASCULAR:  No JVD, no edema.  Bradycardic rhythm.  No murmur.  Pedal  pulses are intact.  ABDOMEN:  Soft, obese, nontender.  No hepatosplenomegaly, no mass.   The  obesity significantly limits the examination.  BREAST/GYNECOLOGIC/RECTAL EXAM:  Not done at this time, due to patient  condition.  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  Otherwise, no obvious abnormality  of the extremities.  There is no ulcer on the feet, and the feet are of  normal color and temperature.  NEUROLOGIC:  Alert, well oriented.  Does not appear anxious or depressed.  Sensation is intact to touch in the feet, and the patient readily moves all  fours.   LABORATORY STUDIES:  Electrocardiogram showed sinus bradycardia.   IMPRESSION:  1. Chest pain of uncertain etiology.  2. Other chronic medical problems as noted.   PLAN:  1. Check CPK's and troponin.  2.  Telemetry.  3. Symptomatic therapy.  4. Check lipids.  5. Beta blocker should be considered, except for her bradycardia.  I will     prescribe a long-acting nitrite instead.  6. Possible plans are (a) for the patient to keep her scheduled Cardiolite     appointment June 03, 2003, and (b) consult cardiology in hospital,     depending on her progress.                                               Sean A. Everardo All, M.D. [facility]    SAE/MEDQ  D:  05/30/2003  T:  05/30/2003  Job:  [id number]   cc:   Corwin Levins, M.D. [facility]

## 2010-08-27 NOTE — Consult Note (Signed)
NAME:  [name], [name]                         ACCOUNT NO.:  [id number]   MEDICAL RECORD NO.:  [id number]                   PATIENT TYPE:  INP   LOCATION:  0101                                 FACILITY:  [facility]   PHYSICIAN:  Charlton Haws, M.D.                  DATE OF BIRTH:  1932/05/09   DATE OF CONSULTATION:  05/30/2003  DATE OF DISCHARGE:                                   CONSULTATION   CARDIOLOGY CONSULTATION:   REASON FOR CONSULTATION:  Mrs. Eads is a 77 year old black female admitted  by Dr. Everardo All for chest pain/question unstable angina.  She is a long-time  diabetic of over 12 years.  She has hypertension.  She has not had a recent  stress test.  She has been having increasing exertional chest pain over the  last 2-3 days.  The pain is exertional.  It has been relieved with resting.  She has not had any associated belching or GI symptoms.  She does have some  exertional dyspnea as well.   She has chronic left lower extremity swelling, particularly in the left foot  which is from gout.  The patient also has hypertension.   She is currently pain-free.  Nitroglycerin did seem to help the pain.   The patient's past medical history is otherwise remarkable for gout in the  left foot, she is status post gallbladder surgery, total abdominal  hysterectomy.   She normally takes Darvon, Naprosyn, Avandia, Glucophage, Lasix, and a blood  pressure pill she is not sure of.   Her diabetic dosages are written on the order sheet from Dr. Everardo All.   The patient denies any significant diaphoresis, cough, blurred vision, rash,  edema, loss of consciousness.   PHYSICAL EXAMINATION:  She is obese.  Blood pressure is 150/68, pulse is 63  and regular.  Lungs are clear.  Carotids normal.  There is no S1/S2, distant  heart sounds.  Abdomen is benign.  Lower extremities with intact pulses,  there is trace lower extremity edema bilaterally.  Chest x-ray and labs are  pending.  Her EKG shows  sinus rhythm and poor R wave progression.   IMPRESSION:  Patient's symptoms are somewhat worrisome for angina.  She is a  long-term diabetic and hypertensive.  I think the most prudent way to assess  her coronaries is with heart catheterization.  The risks including stroke,  need for emergency surgery, dye reaction, bleeding and vascular  complications were discussed with the patient and her family.  They are  willing to proceed on Monday.  I think she should be placed on heparin in  the hospital.  The patient is a diabetic and I suspect her blood pressure  pill is an angiotensin-converting enzyme inhibitor however, this is not  written for in the orders and we will do this.   She will be ruled out for myocardial infarction.  She will be transferred  from [facility] on Monday morning for heart catheterization.                                               Charlton Haws, M.D.    PN/MEDQ  D:  05/30/2003  T:  05/30/2003  Job:  [id number]

## 2010-08-27 NOTE — Consult Note (Signed)
NAMESHRISTI, [name]               ACCOUNT NO.:  [id number]   MEDICAL RECORD NO.:  [id number]          PATIENT TYPE:  INP   LOCATION:  1509                         FACILITY:  [facility]   PHYSICIAN:  Lucrezia Starch. Earlene Plater, M.D.  DATE OF BIRTH:  September 12, 1932   DATE OF CONSULTATION:  DATE OF DISCHARGE:                                   CONSULTATION   CHIEF COMPLAINT:  I was hurting badly.   HISTORY OF PRESENT ILLNESS:  Ms. Cavenaugh is a lovely 77 year old white female  who underwent lumbar surgery, spinal surgery, on 12/08/2005.  She had spinal  stenosis and had surgery at the L-2/3 and L-3/4 interspaces.  She was  subsequently discharged on 12/11/2005 doing well and was readmitted this  morning, which is 12/13/2005.  She notes that she had developed progressive  back spasms, pain down her legs, and had developed total urinary  incontinence.  She notes a Foley catheter was placed and within a short  period of time approximately 1600 cc were in the bag.  She is now  comfortable and really without significant complaints.   PAST MEDICAL HISTORY:  She has multiple medical problems including  hypertension, diabetes.  She has a hiatal hernia with reflux.  She has the  lumbar spinal stenosis, as noted.   PAST SURGICAL HISTORY:  She has had a cholecystectomy, hysterectomy, and  removal of benign ovarian cyst.  She had a lumbar laminectomy 12/08/2005, as  noted.   ALLERGIES:  She is allergic to SULFA, PENICILLIN, CODEINE, and SHELLFISH.   MEDICATIONS:  1. Low dose aspirin.  2. Lasix 80 mg daily.  3. Methocarbamol 500 mg three times a day.  4. Meprozine SD 25 one capsule every three hours.  5. Clonidine 0.1 mg one p.o. twice a day.  6. Lisinopril 40 mg one p.o. daily.  7. Glyburide 4 mg two tablets p.o. daily.  8. Actos 45 mg one tablet p.o. daily.  9. Colchicine 0.6 mg p.o. daily.  10.Additionally, she is on a sliding scale insulin in the hospital and is      on reduced dose Dilaudid PCA pump.   SOCIAL HISTORY:  She is a negative drinker, quit smoking 15 years ago.   FAMILY HISTORY:  Non-significant.   REVIEW OF SYSTEMS:  She has no shortness of breath, dyspnea on exertion,  chest pain, or GI complaints.  She does occasionally have some shortness of  breath with exertion.  She has had muscle spasms and she has significant  back problems, as noted.  She has not had any difficulty urinating before  this episode when she developed a severe incontinence.  She has had no blood  in her urine.  No pain.  She has had good flow.  She has never had any  urinary tract problems.  She has had no diaphoresis, no significant skin  problems.  She does wear corrective lenses, glasses.  She also has upper,  lower, and partial dentures.  She has been feeling somewhat tired and  sluggish and has some lethargy.  This was felt to be related to the surgery  and she is to start physical therapy soon.   PHYSICAL EXAMINATION:  VITAL SIGNS:  Blood pressure is 210/100 and decreased  to 200/98.  Temperature 97.6 F.  Pulse 54.  Respirations 20.  GENERAL:  She is a well-nourished, well-developed, somewhat obese, in no  acute distress.  Oriented x3.  HEAD, EYES, EARS, NOSE, THROAT:  Normal.  NECK:  Without masses or thyromegaly.  CHEST:  Normal diaphragmatic motion.  HEART:  Normal sinus rhythm without murmurs or gallops.  ABDOMEN:  Soft, non-tender, without masses organomegaly.  EXTREMITIES:  She has some weakness, but no neurologic symptoms in the lower  extremities.  NEUROLOGIC:  Grossly intact.  GENITOURINARY:  She has a Foley catheter in place that is draining urine  that is clear and external genitalia appears to be normal.   LABORATORY DATA:  She has no current laboratory evaluation, with recent  admission were essentially negative.   IMPRESSION:  Postoperative urinary retention with overflow incontinence.  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  It was certainly exacerbated by multiple problems including  pain, narcotics, anesthesia, recumbency, and fluids.   PLAN:  1. Urecholine 25 mg p.o. three times a day.  2. Home with a Foley catheter, medically appropriate to allow the bladder      fatigue to improve.  3. Will follow her up in the office next Monday for a fill/pull flow and      evaluate her bladder at that point.  She may require urodynamics.  She      very well may have, again, an underlying diabetic cystopathy.   Thank you very much, please call as needed.      Ronald L. Earlene Plater, M.D.  Electronically Signed     RLD/MEDQ  D:  12/13/2005  T:  12/14/2005  Job:  [id number]   cc:   Windy Fast A. Darrelyn Hillock, M.D.  Fax: [phone]

## 2010-08-27 NOTE — Consult Note (Signed)
NAME:  [name], [name]                         ACCOUNT NO.:  [id number]   MEDICAL RECORD NO.:  [id number]                   PATIENT TYPE:  REC   LOCATION:  FOOT                                 FACILITY:  [facility]   PHYSICIAN:  Jonelle Sports. Sevier, M.D.              DATE OF BIRTH:  1933-04-04   DATE OF CONSULTATION:  08/22/2002  DATE OF DISCHARGE:                                   CONSULTATION   HISTORY:  This 77 year old black female was seen for evaluation of  problematic nails both hallices.   The patient has had type 2 diabetes for some time but is in good control  with recent A1C of 6.2%.  She has not had major foot problems in the past  but apparently has had chronically mycotic nails.  She has had the nail of  her left hallux removed in the past, but it regrew mycotic as well.   With that background history, on the Sunday preceding this visit (four days  ago) the patient noted some spontaneous bleeding without pain and without  known injury from around the nail of the right hallux.  She went to the  [facility] ER where she was seen and placed on cefalexin to be taken 500 mg  b.i.d. for seven days and referred here.   The patient reports that at no time has there been particular pain in that  nail, nor has there been any recognized infection.   The patient does state that she has considerable pain in the entirety of the  left hallux, where there is also obvious nail disease.  Again, there is no  evidence there that she is aware of of infection.   PAST MEDICAL HISTORY:  In addition to the patient's diabetes she has  hypertension, and she has had a cholecystectomy and has a history of ovarian  cystectomies.   ALLERGIES:  She is allergic to SULFA.   REGULAR MEDICATIONS:  Glucotrol XL, Lasix, Glucophage, Avandia, Celebrex,  Diovan, and the present cefalexin.   PHYSICAL EXAMINATION:  Examination today is limited to the distal lower  extremities.  The extremities are really  unremarkable, with no significant  edema, adequate pulses, intact sensation, and no gross deformity.  However,  there is extensive mycotic involvement of all nails of both feet.  Skin  temperatures show a slight differential with slightly higher temperatures on  the left than on the right, but there is no apparent clinical basis for  this.   The nail of the right hallux is partially lifted and slightly loose but not  to the extent that it could be easily removed without considerable  discomfort to the patient.   DISPOSITION:  1. The patient is given instructions regarding foot care and diabetes by     video with nurse and physician reinforcement.  2. Discussion was held with the patient stating that it would be our  recommendation that she have the nails of both hallices surgically     removed and that once healing is complete that she be placed on cyclic     Lamisil therapy as well as topical Lamisil to try to prevent recurrence     of onychomycosis as the nails regrow.  3. She is also advised that there appears to be some degree of arthritis in     the hallux of the left foot, characterized by a thickening there at the     joints of both the MP and IP joints that may account for some of her pain     as well as     the nail disease.  4. Arrangements are made for the patient to return to this clinic on the     Wednesday afternoon of next week for purposes of such surgery.                                               Jonelle Sports. Cheryll Cockayne, M.D.    RES/MEDQ  D:  08/22/2002  T:  08/23/2002  Job:  [id number]   cc:   Corwin Levins, M.D. [facility]

## 2010-08-27 NOTE — Discharge Summary (Signed)
NAME:  [name], [name]                         ACCOUNT NO.:  [id number]   MEDICAL RECORD NO.:  [id number]                   PATIENT TYPE:  INP   LOCATION:  4702                                 FACILITY:  [facility]   PHYSICIAN:  Rene Paci, M.D. [facility]          DATE OF BIRTH:  February 13, 1933   DATE OF ADMISSION:  05/30/2003  DATE OF DISCHARGE:  06/03/2003                                 DISCHARGE SUMMARY   DISCHARGE DIAGNOSES:  1. Chest pain.  2. Myocardial infarction ruled out.  3. Adenosine Cardiolite negative for ischemia.  4. Right ankle gout.  5. Hyperglycemia.   BRIEF ADMISSION HISTORY:  [name] is a 77 year old African American female  who presented with 12 hours of intermittent dull-graded left anterior chest  pain; this was exertional; she rated it as moderate to severe.  It was  associated with nausea.  The patient's cardiac risk factors include  hypertension and diabetes and the patient was admitted to rule out  myocardial infarction.   PAST MEDICAL HISTORY:  1. Adult-onset diabetes mellitus.  2. Osteoarthritis.  3. Status post cholecystectomy.  4. Status post total abdominal hysterectomy.  5. Gout.  6. Hypertension.   HOSPITAL COURSE:  PROBLEM #1 - CARDIOVASCULAR:  The patient was admitted to  rule out myocardial infarction.  Serial cardiac enzymes were negative for  ischemia.  EKG was negative for ischemia.  The patient was evaluated by  cardiology, who initially recommended a cardiac catheterization.  She was  started on heparin in anticipation of cath.  However, the patient has an  allergy to dye.  Therefore, they proceeded with an adenosine Cardiolite;  this was performed on June 02, 2003 and was negative for ischemia.  Therefore, it was not felt that a cardiac cath was indicated and the cardiac  cath was canceled.  Cardiology felt there was no further workup required at  this time.   PROBLEM #2 - GOUT:  The patient suffers with gout.  She developed  painful  swelling in her right ankle.  The symptoms were consistent with gout.  She  was treated with a one-time dose of Solu-Medrol and colchicine with  improvement in her symptoms.  The patient has been instructed to continue  her colchicine as at home.   PROBLEM #3 - HYPERTENSION:  This remained stable.   PROBLEM #4 - ADULT-ONSET DIABETES MELLITUS:  Her blood sugars were also  stable, except for a transient elevation that was felt to be steroid-  related.   PROBLEM #5 - LEUKOCYTOSIS:  The patient developed a white count of 14,000  after receiving Solu-Medrol.  She did not have any fever or signs of  infectious process.  Her leukocytosis was felt to be secondary to Solu-  Medrol and perhaps underlying gout.   LABORATORIES AT DISCHARGE:  White count 14.7, hemoglobin 12.3.  BNP was less  than 30.  BUN 21 and creatinine 0.9.  Hemoglobin 11.4.  Coagulations  are  normal.  Total cholesterol 176, triglycerides 105, HDL 42, LDL 113, total  iron 72.   MEDICATIONS AT DISCHARGE:  1. Glucophage XR 1 g b.i.d.  2. Avandia 8 mg daily.  3. Lasix 80 mg daily.  4. Colchicine 0.6 mg b.i.d.  5. Darvocet and naproxen as at home.   FOLLOWUP:  Follow up with Dr. Corwin Levins in 2 to 3 weeks.      Cornell Barman, P.A. [facility]                  Rene Paci, M.D. [facility]    LC/MEDQ  D:  06/03/2003  T:  06/03/2003  Job:  [id number]   cc:   Corwin Levins, M.D. [facility]

## 2010-08-27 NOTE — Op Note (Signed)
[name], [name]               ACCOUNT NO.:  [id number]   MEDICAL RECORD NO.:  [id number]          PATIENT TYPE:  INP   LOCATION:  NA                           FACILITY:  [facility]   PHYSICIAN:  Georges Lynch. Gioffre, M.D.DATE OF BIRTH:  14-Jan-1933   DATE OF PROCEDURE:  12/08/2005  DATE OF DISCHARGE:                                 OPERATIVE REPORT   SURGEON:  Georges Lynch. Darrelyn Hillock, M.D.   ASSISTANT:  Marlowe Kays, M.D.   PREOPERATIVE DIAGNOSES:  1. Severe spinal stenosis at L2-L3.  2. Severe spinal stenosis at L3-L4 with foraminal stenosis.  3. Bulging disk at both L2-L3, L3-4.   POSTOPERATIVE DIAGNOSES:  1. Severe spinal stenosis at L2-L3.  2. Severe spinal stenosis at L3-L4 with foraminal stenosis.  3. Bulging disk at both L2-L3, L3-4.   OPERATION:  1. Decompressive lumbar laminectomy at L2-3.  2. Decompressive lumbar laminectomy at L3-4.  3. Foraminotomies bilaterally at both levels.   PROCEDURE:  Under general anesthesia, routine orthopedic prep and draping of  the back was carried out.  No iodine was used because she is allergic to  iodine.  She was given 1 gram of IV vancomycin during the procedure.  Two  needles were placed in the back for localization purposes.  X-ray was taken.   An incision was made over the L2-3/L3-4 interspace.  Bleeders identified and  cauterized.  Note the lady was quite obese and the wound was exceptionally  deep.  We gently dissected down to the lumbar fascia and then we identified  the spinous processes.  We separated the muscle from the spinous processes  and the laminae in the usual fashion.  Following that, another x-ray was  taken to verify the position.  Finally a third x-ray was taken to verify our  position.   We then removed the spinous processes of L2 and L3 and part of L4.  Then we  went down and did a central decompressive lumbar laminectomy at L2-3 and L3-  4.  Following that, we removed the ligamentum flavum.  The ligamentum  flavum  was markedly thickened.  She had severe constriction at L2-3 and at L3-4  which we felt was definitely her main problem causing this spinal stenosis.   We did foraminotomies as well.  We protected the roots nicely and isolated  the roots as they go out through the foramen. There was really no marked  herniated disk.  We carried the dissection distally until we had complete  freedom of the dura as well as proximally.  We inserted a hockey-stick  proximally and distally to make sure that we were wide open now as far as  her decompression distally and proximally.  We did a nice decompression of  the lateral recesses as well.  We did wide decompression of the lateral  recesses.   We thoroughly irrigated out the area.  Good hemostasis was maintained.  We  loosely applied some thrombin-soaked Gelfoam and closed the wound layers in  the usual fashion.  I did leave the deep proximal part of the wound open  with a  Penrose drain inserted for drainage purposes.  Subcu was closed with  0 Vicryl.  Prior to closing the subcu, I injected 20 mL of 0.5% Marcaine  with epinephrine in the wound site.  Following that, the skin then was  closed with metal staples.  Sterile Neosporin dressing was applied.  She was  given 30 mg of Toradol IV prior to leaving the operating room.  She left the  operating room in satisfactory position.           ______________________________  Georges Lynch Darrelyn Hillock, M.D.     RAG/MEDQ  D:  12/08/2005  T:  12/09/2005  Job:  [id number]

## 2010-09-29 ENCOUNTER — Telehealth: Payer: Self-pay | Admitting: Cardiovascular Disease

## 2010-09-29 NOTE — Telephone Encounter (Signed)
C/o chest pain. No nitro taken.

## 2010-09-29 NOTE — Telephone Encounter (Signed)
Tried again to reach pt. No answer after 10 rings. Tried cell number listed in chart ([phone] ) but no voicemail set up

## 2010-09-29 NOTE — Telephone Encounter (Signed)
Tried again to reach pt. No answer after 10 rings.

## 2010-09-29 NOTE — Telephone Encounter (Signed)
Tried to reach pt. No answer. Will continue to try to reach pt.

## 2010-09-30 NOTE — Telephone Encounter (Signed)
Spoke with pt who reports after calling our office yesterday she went to physical therapy and she feels pain was related to muscle spasms.  She is feeling better today.

## 2010-10-08 ENCOUNTER — Encounter: Payer: Self-pay | Admitting: Internal Medicine

## 2010-10-08 DIAGNOSIS — Z0289 Encounter for other administrative examinations: Secondary | ICD-10-CM

## 2010-10-17 ENCOUNTER — Other Ambulatory Visit: Payer: Self-pay | Admitting: Internal Medicine

## 2010-11-01 ENCOUNTER — Other Ambulatory Visit: Payer: Self-pay | Admitting: Internal Medicine

## 2010-11-11 ENCOUNTER — Other Ambulatory Visit: Payer: Self-pay | Admitting: Endocrinology

## 2010-11-23 ENCOUNTER — Other Ambulatory Visit: Payer: Self-pay | Admitting: *Deleted

## 2010-11-23 MED ORDER — ATORVASTATIN CALCIUM 20 MG PO TABS: 20.0000 mg | ORAL_TABLET | Freq: Every day | ORAL | Status: DC

## 2010-11-29 ENCOUNTER — Inpatient Hospital Stay (HOSPITAL_COMMUNITY)
Admission: EM | Admit: 2010-11-29 | Discharge: 2010-12-01 | DRG: 287 | Disposition: A | Discharge status: Home / Self Care | Payer: Medicare Other | Attending: Cardiovascular Disease | Admitting: Cardiovascular Disease

## 2010-11-29 ENCOUNTER — Emergency Department (HOSPITAL_COMMUNITY): Payer: Medicare Other

## 2010-11-29 DIAGNOSIS — I739 Peripheral vascular disease, unspecified: Secondary | ICD-10-CM | POA: Diagnosis present

## 2010-11-29 DIAGNOSIS — Z794 Long term (current) use of insulin: Secondary | ICD-10-CM

## 2010-11-29 DIAGNOSIS — Z9861 Coronary angioplasty status: Secondary | ICD-10-CM

## 2010-11-29 DIAGNOSIS — E039 Hypothyroidism, unspecified: Secondary | ICD-10-CM | POA: Diagnosis present

## 2010-11-29 DIAGNOSIS — I200 Unstable angina: Secondary | ICD-10-CM | POA: Diagnosis present

## 2010-11-29 DIAGNOSIS — I10 Essential (primary) hypertension: Secondary | ICD-10-CM | POA: Diagnosis present

## 2010-11-29 DIAGNOSIS — E785 Hyperlipidemia, unspecified: Secondary | ICD-10-CM | POA: Diagnosis present

## 2010-11-29 DIAGNOSIS — Z79899 Other long term (current) drug therapy: Secondary | ICD-10-CM

## 2010-11-29 DIAGNOSIS — E119 Type 2 diabetes mellitus without complications: Secondary | ICD-10-CM | POA: Diagnosis present

## 2010-11-29 DIAGNOSIS — Z7982 Long term (current) use of aspirin: Secondary | ICD-10-CM

## 2010-11-29 DIAGNOSIS — G8929 Other chronic pain: Secondary | ICD-10-CM | POA: Diagnosis present

## 2010-11-29 DIAGNOSIS — R079 Chest pain, unspecified: Secondary | ICD-10-CM

## 2010-11-29 DIAGNOSIS — I2510 Atherosclerotic heart disease of native coronary artery without angina pectoris: Principal | ICD-10-CM | POA: Diagnosis present

## 2010-11-29 DIAGNOSIS — Z880 Allergy status to penicillin: Secondary | ICD-10-CM

## 2010-11-29 DIAGNOSIS — Z87891 Personal history of nicotine dependence: Secondary | ICD-10-CM

## 2010-11-29 DIAGNOSIS — Z951 Presence of aortocoronary bypass graft: Secondary | ICD-10-CM

## 2010-11-29 DIAGNOSIS — M549 Dorsalgia, unspecified: Secondary | ICD-10-CM | POA: Diagnosis present

## 2010-11-29 DIAGNOSIS — Z91013 Allergy to seafood: Secondary | ICD-10-CM

## 2010-11-29 DIAGNOSIS — Z882 Allergy status to sulfonamides status: Secondary | ICD-10-CM

## 2010-11-29 LAB — CBC
HCT: 35.3 % — ABNORMAL LOW (ref 36.0–46.0)
Hemoglobin: 11.5 g/dL — ABNORMAL LOW (ref 12.0–15.0)
MCH: 29 pg (ref 26.0–34.0)
MCHC: 32.6 g/dL (ref 30.0–36.0)
MCV: 88.9 fL (ref 78.0–100.0)
RBC: 3.97 MIL/uL (ref 3.87–5.11)

## 2010-11-29 LAB — DIFFERENTIAL
Basophils Absolute: 0 10*3/uL (ref 0.0–0.1)
Basophils Relative: 0 % (ref 0–1)
Eosinophils Relative: 2 % (ref 0–5)
Lymphocytes Relative: 15 % (ref 12–46)
Lymphs Abs: 1.5 10*3/uL (ref 0.7–4.0)
Monocytes Relative: 7 % (ref 3–12)
Neutro Abs: 7.7 10*3/uL (ref 1.7–7.7)

## 2010-11-29 LAB — BASIC METABOLIC PANEL
BUN: 33 mg/dL — ABNORMAL HIGH (ref 6–23)
CO2: 29 mEq/L (ref 19–32)
Chloride: 104 mEq/L (ref 96–112)
GFR calc Af Amer: 52 mL/min — ABNORMAL LOW (ref >60)
Potassium: 4.1 mEq/L (ref 3.5–5.1)

## 2010-11-29 LAB — PROTIME-INR
INR: 0.99 (ref 0.00–1.49)
Prothrombin Time: 13.3 seconds (ref 11.6–15.2)

## 2010-11-29 LAB — APTT: aPTT: 29 seconds (ref 24–37)

## 2010-11-29 LAB — POCT I-STAT TROPONIN I: Troponin i, poc: 0.01 ng/mL (ref 0.00–0.08)

## 2010-11-30 DIAGNOSIS — I2510 Atherosclerotic heart disease of native coronary artery without angina pectoris: Secondary | ICD-10-CM

## 2010-11-30 LAB — CARDIAC PANEL(CRET KIN+CKTOT+MB+TROPI)
Relative Index: 2 (ref 0.0–2.5)
Total CK: 216 U/L — ABNORMAL HIGH (ref 7–177)
Troponin I: <0.3 ng/mL (ref <0.30)
Troponin I: <0.3 ng/mL (ref <0.30)

## 2010-11-30 LAB — LIPID PANEL
HDL: 68 mg/dL (ref >39)
LDL Cholesterol: 29 mg/dL (ref 0–99)
Triglycerides: 89 mg/dL (ref <150)
VLDL: 18 mg/dL (ref 0–40)

## 2010-11-30 LAB — GLUCOSE, CAPILLARY
Glucose-Capillary: 99 mg/dL (ref 70–99)
Glucose-Capillary: 88 mg/dL (ref 70–99)

## 2010-11-30 LAB — HEPARIN LEVEL (UNFRACTIONATED): Heparin Unfractionated: 0.4 IU/mL (ref 0.30–0.70)

## 2010-12-01 DIAGNOSIS — I200 Unstable angina: Secondary | ICD-10-CM

## 2010-12-01 LAB — BASIC METABOLIC PANEL
BUN: 18 mg/dL (ref 6–23)
Calcium: 9.2 mg/dL (ref 8.4–10.5)
Creatinine, Ser: 0.77 mg/dL (ref 0.50–1.10)
GFR calc Af Amer: >60 mL/min (ref >60)
GFR calc non Af Amer: >60 mL/min (ref >60)
Potassium: 3.7 mEq/L (ref 3.5–5.1)

## 2010-12-01 LAB — GLUCOSE, CAPILLARY: Glucose-Capillary: 142 mg/dL — ABNORMAL HIGH (ref 70–99)

## 2010-12-02 ENCOUNTER — Other Ambulatory Visit: Payer: Self-pay | Admitting: *Deleted

## 2010-12-02 MED ORDER — ATORVASTATIN CALCIUM 20 MG PO TABS: 20.0000 mg | ORAL_TABLET | Freq: Every day | ORAL | Status: DC

## 2010-12-06 ENCOUNTER — Encounter: Payer: Self-pay | Admitting: *Deleted

## 2010-12-06 NOTE — Cardiovascular Report (Signed)
[name], [name] NO.:  [id number]  MEDICAL RECORD NO.:  [id number]  LOCATION:  2005                         FACILITY:  [facility]  PHYSICIAN:  Dahir Ayer M. [place], M.D.  DATE OF BIRTH:  [birth year]  DATE OF PROCEDURE:  11/30/2010 DATE OF DISCHARGE:                           CARDIAC CATHETERIZATION   INDICATIONS FOR PROCEDURE:  A 77 year old white female with history of coronary artery disease.  She had multivessel stenting in 2009.  In 2011, she underwent coronary artery bypass surgery.  She presents now with symptoms of chest pain and had borderline elevated cardiac enzymes.  PROCEDURES:  Left heart catheterization, coronary and left ventricular angiography, saphenous vein graft angiography x3, and LIMA graft angiography.  ACCESS:  Via the right femoral artery using the standard Seldinger technique.  There was modest difficulty with groin access given significant scarring in the femoral area.  EQUIPMENTS:  A 5-French 4-cm right and left Judkins catheter, 5-French pigtail catheter, 5-French LIMA catheter.  MEDICATIONS:  Local anesthesia, 1% Xylocaine, Versed 1 mg IV, fentanyl 25 mcg IV, Apresoline 10 mg IV.  CONTRAST:  100 mL of Omnipaque.  HEMODYNAMIC DATA:  Aortic pressure was 196/75 with a mean of 111, left ventricle pressure was 195 with an EDP of 27 mmHg.  ANGIOGRAPHIC DATA:  The left coronary artery arises and distributes normally.  The left main coronary artery has 20% irregularities.  The left anterior descending artery has 70% disease in the proximal vessel at prior stent site.  It is occluded in the midvessel.  The left circumflex coronary artery is a large codominant vessel.  It has a 40% narrowing in the proximal vessel.  It is occluded after the second obtuse marginal branch.  The first obtuse marginal branch is a very small vessel with 90% stenosis proximally.  The second obtuse marginal vessel has an 80-90% proximal stenosis and has  competitive filling from the vein graft.  The right coronary artery is a dominant vessel.  The stent in the proximal vessel was patent, but there is diffuse 90-99% disease throughout the entire distal vessel.  The saphenous vein graft to the posterior descending artery is patent.  The saphenous vein graft to the second and third obtuse marginal vessels are patent.  The saphenous vein graft to the diagonal was patent.  The LIMA graft to the LAD is patent.  Left ventricular angiography performed in the RAO view demonstrates normal left ventricular size and contractility with normal systolic function.  Ejection fraction is estimated at 55-60%.  FINAL INTERPRETATION: 1. Severe three-vessel obstructive atherosclerotic coronary artery     disease. 2. Continued patency of all her grafts including saphenous vein graft     to the PDA, saphenous vein graft sequentially to the second and     third obtuse marginal vessels, saphenous vein graft to the     diagonal, and LIMA graft to the LAD. 3. Normal left ventricular function.  PLAN:  We would recommend continued medical therapy.          ______________________________ [name] M. [place], M.D.     PMJ/MEDQ  D:  11/30/2010  T:  11/30/2010  Job:  [id number]  Electronically Signed by Keyairra Kolinski [place]  M.D. on 12/06/2010 02:15:32 PM

## 2010-12-07 NOTE — Discharge Summary (Signed)
[name], [name]               ACCOUNT NO.:  [id number]  MEDICAL RECORD NO.:  [id number]  LOCATION:  2005                         FACILITY:  [facility]  PHYSICIAN:  Noralyn Pick. Eden Emms, MD, FACCDATE OF BIRTH:  Sep 12, 1932  DATE OF ADMISSION:  11/29/2010 DATE OF DISCHARGE:  12/01/2010                              DISCHARGE SUMMARY   PRIMARY CARDIOLOGIST:  [name]. Excell Seltzer, MD  PRIMARY CARE PHYSICIAN:  Corwin Levins, MD  DISCHARGE DIAGNOSIS:  Unstable angina with very mild troponin elevation of 0.39.  SECONDARY DIAGNOSES: 1. Coronary artery disease status post coronary artery bypass grafting     x5, with all grafts patent by catheterization this admission. 2. Hypertension. 3. Hyperlipidemia. 4. Diabetes mellitus. 5. Peripheral vascular disease. 6. Hypothyroidism. 7. Chronic back pain.  ALLERGIES:  PENICILLIN, SULFA, CODEINE, VICODIN, SHELLFISH, METFORMIN, LOVASTATIN, DILTIAZEM.  PROCEDURES:  Left heart cardiac catheterization performed on November 30, 2010, showing severe multivessel native coronary artery disease with 5/5 patent grafts including the vein graft to the PDA, sequential vein graft to the OM-2 and OM-3, vein graft to the diagonal, and LIMA to the LAD. EF was 55-60%.  HISTORY OF PRESENT ILLNESS:  A 77 year old female with prior history of coronary artery disease status post prior coronary artery bypass grafting in November 2011, who was in her usual state of health until the day of admission when she had sudden onset of left-sided chest discomfort with exertion that relieved with rest.  She presented to the [facility] ED where ECG showed no acute changes and her point-of-care cardiac markers were negative.  She was admitted for further evaluation.  HOSPITAL COURSE:  On the patient's second set of cardiac markers, she had elevation of her CK to 334, MB to 5.2, and troponin I to 0.39.  All subsequent sets revealed a normal troponin.  There was question as to whether  or not this represented non-ST-elevation MI.  The patient was maintained on heparin therapy and decision was made to pursue catheterization.  Diagnostic catheterization was undertaken on November 30, 2010, revealing multivessel native coronary artery disease with 5/5 patent grafts.  The patient has had no recurrent chest discomfort and was discharged home today in good condition.  Of note, we did initially write for beta- blocker therapy, however, due to bradycardia, beta-blocker was discontinued.  DISCHARGE LABS:  Hemoglobin 11.5, hematocrit 35.3, WBC 10.1, platelets 114.  INR is 0.99.  Sodium 138, potassium 3.7, chloride 105, CO2 29, BUN 18, creatinine 0.77, glucose 146.  Hemoglobin A1c 7.3.  CK 216, MB 4.2, troponin I less than 0.30.  Total cholesterol 115, triglycerides 89, HDL 68, LDL 29.  DISPOSITION:  The patient is being discharged home today in good condition.  FOLLOWUP PLANS AND APPOINTMENTS:  The patient will follow up with Dr. Tonny Bollman as previously scheduled on December 14, 2010, at 9 a.m.  DISCHARGE MEDICATIONS: 1. Nitroglycerin 0.4 mg sublingual p.r.n. chest pain. 2. Allopurinol 100 mg daily. 3. Amlodipine 10 mg daily. 4. Atorvastatin 20 mg daily. 5. Aspirin 81 mg daily. 6. Cozaar 100 mg daily. 7. Diclofenac 50 mg b.i.d. 8. Lasix 80 mg daily. 9. Humulin-N 70 units in the morning, 20 units in the  p.m. 10.Synthroid 100 mcg daily. 11.Nu-Iron 150 mg daily. 12.Omeprazole 20 mg 2 caps daily. 13.Robaxin 500 mg q.6 hours p.r.n.  OUTSTANDING LAB STUDIES:  None.  DURATION OF DISCHARGE ENCOUNTER:  40 minutes including physician time.     Nicolasa Ducking, ANP   ______________________________ Noralyn Pick. Eden Emms, MD, [facility]    CB/MEDQ  D:  12/01/2010  T:  12/01/2010  Job:  [id number]  cc:   Corwin Levins, MD  Electronically Signed by Nicolasa Ducking ANP on 12/02/2010 03:04:13 PM Electronically Signed by Charlton Haws MD [facility] on 12/07/2010 02:03:14 PM

## 2010-12-14 ENCOUNTER — Ambulatory Visit: Payer: Medicare Other | Admitting: Cardiovascular Disease

## 2010-12-24 ENCOUNTER — Ambulatory Visit: Payer: Medicare Other | Admitting: Cardiovascular Disease

## 2010-12-27 ENCOUNTER — Ambulatory Visit (INDEPENDENT_AMBULATORY_CARE_PROVIDER_SITE_OTHER): Payer: Medicare Other | Admitting: Cardiovascular Disease

## 2010-12-27 ENCOUNTER — Encounter: Payer: Self-pay | Admitting: Cardiovascular Disease

## 2010-12-27 VITALS — BP 149/63 | HR 63 | Ht 67.0 in | Wt 225.0 lb

## 2010-12-27 DIAGNOSIS — I10 Essential (primary) hypertension: Secondary | ICD-10-CM

## 2010-12-27 DIAGNOSIS — I2510 Atherosclerotic heart disease of native coronary artery without angina pectoris: Secondary | ICD-10-CM

## 2010-12-27 DIAGNOSIS — E7800 Pure hypercholesterolemia, unspecified: Secondary | ICD-10-CM

## 2010-12-27 DIAGNOSIS — E785 Hyperlipidemia, unspecified: Secondary | ICD-10-CM

## 2010-12-27 MED ORDER — PRAVASTATIN SODIUM 40 MG PO TABS: 40.0000 mg | ORAL_TABLET | Freq: Every evening | ORAL | Status: DC

## 2010-12-27 NOTE — Patient Instructions (Signed)
Your physician wants you to follow-up in:  6 months. You will receive a reminder letter in the mail two months in advance. If you don't receive a letter, please call our office to schedule the follow-up appointment.   

## 2010-12-27 NOTE — Progress Notes (Signed)
HPI:  This is a 81 her old woman presenting for followup evaluation. She has coronary artery disease status post multivessel PCI in 2009. She presented in 2011 with unstable angina and ultimately required coronary bypass surgery for recurrent multivessel disease. She was admitted earlier this year with bradycardia and syncope. Her beta blocker was discontinued and she is having no further events. She complains of a chest pain in the upper chest. This is unlike her chest pain associated with her cardiac problems. The pain is nonexertional and feels like a "burning." It is exacerbated by certain movements. She denies shortness of breath, edema, or palpitations. She has established with Dr. Parke Simmers as her primary care physician.  Outpatient Encounter Prescriptions as of 12/27/2010  Medication Sig Dispense Refill  . allopurinol (ZYLOPRIM) 100 MG tablet TAKE ONE TABLET BY MOUTH EVERY DAY  90 tablet  3  . amLODipine (NORVASC) 10 MG tablet TAKE ONE TABLET BY MOUTH EVERY DAY  30 tablet  5  . aspirin 81 MG chewable tablet Chew 81 mg by mouth daily.        Jerrell Belfast Lancet Super Thin 30G MISC as directed.        . diclofenac (VOLTAREN) 50 MG EC tablet Take 50 mg by mouth 2 (two) times daily as needed.        . Ergocalciferol (VITAMIN D2) 400 UNITS TABS Take by mouth daily.        . furosemide (LASIX) 80 MG tablet Take 80 mg by mouth every morning.        Marland Kitchen glucose blood (ONE TOUCH ULTRA TEST) test strip 1 each by Other route 2 (two) times daily as needed. Use as instructed       . insulin NPH (HUMULIN N) 100 UNIT/ML injection Inject into the skin as directed. 70 units each am and 20 units in the evening      . iron polysaccharides (NIFEREX) 150 MG capsule Take 150 mg by mouth daily.        Marland Kitchen levocetirizine (XYZAL) 5 MG tablet Take 5 mg by mouth daily as needed. For allergies       . levothyroxine (SYNTHROID, LEVOTHROID) 100 MCG tablet TAKE ONE TABLET BY MOUTH EVERY DAY  90 tablet  1  . losartan (COZAAR) 100 MG  tablet Take 100 mg by mouth daily.        . methocarbamol (ROBAXIN) 500 MG tablet Take 500 mg by mouth every 6 (six) hours as needed.        . nitroGLYCERIN (NITROSTAT) 0.4 MG SL tablet Place 0.4 mg under the tongue as directed.        Marland Kitchen omeprazole (PRILOSEC) 20 MG capsule Take 40 mg by mouth daily.        . potassium chloride SA (K-DUR,KLOR-CON) 20 MEQ tablet Take 20 mEq by mouth 2 (two) times daily.        . pravastatin (PRAVACHOL) 40 MG tablet Take 1 tablet (40 mg total) by mouth every evening.  90 tablet  3  . DISCONTD: atorvastatin (LIPITOR) 20 MG tablet Take 1 tablet (20 mg total) by mouth daily.  90 tablet  prn  . DISCONTD: oxycodone (OXY-IR) 5 MG capsule Take 1 capsule (5 mg total) by mouth every 6 (six) hours as needed.  40 capsule  0    Allergies  Allergen Reactions  . Ciprofloxacin Nausea And Vomiting    syncope  . Diltiazem Hcl     REACTION: low heart rate  . Lovastatin  REACTION: rhabdo  . Metformin   . Penicillins   . Sulfonamide Derivatives     Past Medical History  Diagnosis Date  . CAD (coronary artery disease)     Multivessel s/p PCI w/DES 2008 and CABG 2011  . HTN (hypertension)   . Type II or unspecified type diabetes mellitus without mention of complication, not stated as uncontrolled   . PVD (peripheral vascular disease)   . Diverticulosis of colon   . Morbid obesity   . Gout   . Depression   . Osteoarthritis   . Osteopenia   . Hyperlipidemia   . LBP (low back pain)     Lumbar disc disease/lumbar spinal stenosis  . Disc disease, degenerative, cervical   . History of thrombocytopenia   . Anxiety   . GERD (gastroesophageal reflux disease)   . Barrett esophagus   . Gastroparesis   . Helicobacter pylori gastritis   . Allergic rhinitis   . Anemia     ROS: Negative except as per HPI  BP 149/63  Pulse 63  Ht 5\' 7"  (1.702 m)  Wt 225 lb (102.059 kg)  BMI 35.24 kg/m2  PHYSICAL EXAM: Pt is alert and oriented, obese, very pleasant woman in  NAD HEENT: normal Neck: JVP - normal, carotids 2+= without bruits Lungs: CTA bilaterally CV: RRR with grade 2/6 harsh early peaking systolic murmur at the left sternal border Abd: soft, NT, Positive BS, no hepatomegaly Ext: no C/C/E, distal pulses intact and equal Skin: warm/dry no rash  EKG:  Normal sinus rhythm 63 beats per minute, low voltage QRS, cannot rule out anterior infarct age undetermined.  ASSESSMENT AND PLAN:

## 2010-12-27 NOTE — Assessment & Plan Note (Signed)
She reports a good blood pressure and her recent office visit with her primary care physician. We'll continue to monitor on her current medical program.

## 2010-12-27 NOTE — Assessment & Plan Note (Signed)
Recent lipids show total cholesterol of 1:15. HDL was 68. Calculated LDL was 29 but direct LDL was 82. Triglycerides were 89. She should continue on a statin drug.

## 2010-12-27 NOTE — Assessment & Plan Note (Signed)
The patient is stable without angina. She will continue on her current medical program which includes aspirin, and angiotensin receptor blocker, and a statin drug. She has run out of pravastatin and will write a refill for her today.

## 2010-12-29 LAB — LIPID PANEL
Cholesterol: 161
LDL Cholesterol: 101 — ABNORMAL HIGH
Triglycerides: 123
VLDL: 25

## 2010-12-29 LAB — BASIC METABOLIC PANEL
BUN: 17
Calcium: 8.3 — ABNORMAL LOW
Creatinine, Ser: 1.17
GFR calc non Af Amer: 45 — ABNORMAL LOW
Glucose, Bld: 220 — ABNORMAL HIGH
Sodium: 138

## 2010-12-29 LAB — CARDIAC PANEL(CRET KIN+CKTOT+MB+TROPI)
Total CK: 401 — ABNORMAL HIGH
Troponin I: 0.01

## 2010-12-29 LAB — DIFFERENTIAL
Basophils Absolute: 0.1
Basophils Relative: 1
Lymphocytes Relative: 21
Neutro Abs: 5.2
Neutrophils Relative %: 67

## 2010-12-29 LAB — CBC
MCHC: 33.7
Platelets: 136 — ABNORMAL LOW
RDW: 13.6

## 2010-12-29 LAB — POCT CARDIAC MARKERS
CKMB, poc: 3.5
Operator id: 151321
Troponin i, poc: <0.05

## 2010-12-29 LAB — PROTIME-INR: INR: 1

## 2010-12-30 LAB — CK TOTAL AND CKMB (NOT AT ARMC)
CK, MB: 18 — ABNORMAL HIGH
Total CK: 237 — ABNORMAL HIGH

## 2010-12-30 LAB — DIFFERENTIAL
Basophils Absolute: 0
Basophils Relative: 0
Eosinophils Absolute: 0
Eosinophils Absolute: 0
Lymphs Abs: 0.8
Monocytes Relative: 2 — ABNORMAL LOW
Neutro Abs: 11.4 — ABNORMAL HIGH
Neutro Abs: 9.8 — ABNORMAL HIGH
Neutrophils Relative %: 82 — ABNORMAL HIGH
Neutrophils Relative %: 90 — ABNORMAL HIGH

## 2010-12-30 LAB — CBC
HCT: 35.3 — ABNORMAL LOW
HCT: 37.7
Hemoglobin: 12.7
MCHC: 33.5
MCHC: 33.8
MCHC: 34.3
MCV: 89.6
MCV: 89.7
MCV: 89.8
Platelets: 139 — ABNORMAL LOW
Platelets: 168
RBC: 3.94
RBC: 4.03
RBC: 4.35
RDW: 13.4
RDW: 13.6
WBC: 10
WBC: 10.9 — ABNORMAL HIGH

## 2010-12-30 LAB — BASIC METABOLIC PANEL
BUN: 14
CO2: 24
CO2: 28
Calcium: 8.4
Calcium: 8.7
Chloride: 105
Chloride: 108
Creatinine, Ser: 0.83
GFR calc Af Amer: >60
Glucose, Bld: 288 — ABNORMAL HIGH
Sodium: 137

## 2010-12-30 LAB — HEPARIN LEVEL (UNFRACTIONATED)
Heparin Unfractionated: 0.67
Heparin Unfractionated: 0.77 — ABNORMAL HIGH

## 2010-12-30 LAB — TROPONIN I: Troponin I: 1.25

## 2010-12-30 LAB — CARDIAC PANEL(CRET KIN+CKTOT+MB+TROPI): Total CK: 272 — ABNORMAL HIGH

## 2010-12-31 LAB — TSH: TSH: 3.198

## 2010-12-31 LAB — CBC
HCT: 38
MCHC: 34.1
MCV: 89.6
Platelets: 118 — ABNORMAL LOW
RDW: 13.2

## 2010-12-31 LAB — TROPONIN I
Troponin I: 0.02
Troponin I: 0.02

## 2010-12-31 LAB — CK TOTAL AND CKMB (NOT AT ARMC)
Relative Index: 1.7
Total CK: 248 — ABNORMAL HIGH
Total CK: 387 — ABNORMAL HIGH

## 2010-12-31 LAB — DIFFERENTIAL
Basophils Absolute: 0
Eosinophils Relative: 3
Lymphocytes Relative: 20
Neutro Abs: 4.4
Neutrophils Relative %: 65

## 2010-12-31 LAB — COMPREHENSIVE METABOLIC PANEL
AST: 83 — ABNORMAL HIGH
BUN: 20
CO2: 29
Calcium: 9
Chloride: 103
Creatinine, Ser: 1.06
GFR calc non Af Amer: 51 — ABNORMAL LOW
Glucose, Bld: 287 — ABNORMAL HIGH
Total Bilirubin: 0.4

## 2010-12-31 LAB — PROTIME-INR
INR: 1
Prothrombin Time: 13.2

## 2011-01-03 ENCOUNTER — Other Ambulatory Visit: Payer: Self-pay | Admitting: Endocrinology

## 2011-01-03 ENCOUNTER — Other Ambulatory Visit: Payer: Self-pay | Admitting: Internal Medicine

## 2011-01-14 LAB — DIFFERENTIAL
Basophils Relative: 0 % (ref 0–1)
Lymphs Abs: 1.4 10*3/uL (ref 0.7–4.0)
Monocytes Absolute: 0.5 10*3/uL (ref 0.1–1.0)
Monocytes Relative: 9 % (ref 3–12)
Neutro Abs: 3.7 10*3/uL (ref 1.7–7.7)

## 2011-01-14 LAB — COMPREHENSIVE METABOLIC PANEL
Albumin: 3 g/dL — ABNORMAL LOW (ref 3.5–5.2)
Alkaline Phosphatase: 130 U/L — ABNORMAL HIGH (ref 39–117)
BUN: 12 mg/dL (ref 6–23)
Calcium: 8.5 mg/dL (ref 8.4–10.5)
Potassium: 3.9 mEq/L (ref 3.5–5.1)
Sodium: 133 mEq/L — ABNORMAL LOW (ref 135–145)
Total Protein: 5.6 g/dL — ABNORMAL LOW (ref 6.0–8.3)

## 2011-01-14 LAB — CBC
HCT: 33 % — ABNORMAL LOW (ref 36.0–46.0)
HCT: 34.1 % — ABNORMAL LOW (ref 36.0–46.0)
Hemoglobin: 11.6 g/dL — ABNORMAL LOW (ref 12.0–15.0)
MCHC: 33.8 g/dL (ref 30.0–36.0)
MCHC: 34.2 g/dL (ref 30.0–36.0)
MCV: 85.8 fL (ref 78.0–100.0)
MCV: 85.9 fL (ref 78.0–100.0)
Platelets: 84 10*3/uL — ABNORMAL LOW (ref 150–400)
Platelets: 85 10*3/uL — ABNORMAL LOW (ref 150–400)
Platelets: 95 10*3/uL — ABNORMAL LOW (ref 150–400)
RBC: 3.94 MIL/uL (ref 3.87–5.11)
RBC: 3.97 MIL/uL (ref 3.87–5.11)
RDW: 13.9 % (ref 11.5–15.5)
RDW: 14.3 % (ref 11.5–15.5)
RDW: 14.8 % (ref 11.5–15.5)
WBC: 5.7 10*3/uL (ref 4.0–10.5)

## 2011-01-14 LAB — GLUCOSE, CAPILLARY
Glucose-Capillary: 135 mg/dL — ABNORMAL HIGH (ref 70–99)
Glucose-Capillary: 139 mg/dL — ABNORMAL HIGH (ref 70–99)
Glucose-Capillary: 140 mg/dL — ABNORMAL HIGH (ref 70–99)
Glucose-Capillary: 160 mg/dL — ABNORMAL HIGH (ref 70–99)
Glucose-Capillary: 179 mg/dL — ABNORMAL HIGH (ref 70–99)
Glucose-Capillary: 180 mg/dL — ABNORMAL HIGH (ref 70–99)
Glucose-Capillary: 184 mg/dL — ABNORMAL HIGH (ref 70–99)
Glucose-Capillary: 186 mg/dL — ABNORMAL HIGH (ref 70–99)
Glucose-Capillary: 190 mg/dL — ABNORMAL HIGH (ref 70–99)
Glucose-Capillary: 235 mg/dL — ABNORMAL HIGH (ref 70–99)
Glucose-Capillary: 322 mg/dL — ABNORMAL HIGH (ref 70–99)

## 2011-01-14 LAB — POCT CARDIAC MARKERS
CKMB, poc: 3.9 ng/mL (ref 1.0–8.0)
CKMB, poc: 4.3 ng/mL (ref 1.0–8.0)
Myoglobin, poc: 86.8 ng/mL (ref 12–200)
Troponin i, poc: <0.05 ng/mL (ref 0.00–0.09)

## 2011-01-14 LAB — BASIC METABOLIC PANEL WITH GFR
BUN: 16 mg/dL (ref 6–23)
CO2: 30 meq/L (ref 19–32)
Calcium: 9.1 mg/dL (ref 8.4–10.5)
Chloride: 104 meq/L (ref 96–112)
Creatinine, Ser: 0.88 mg/dL (ref 0.4–1.2)
GFR calc non Af Amer: >60 mL/min
Glucose, Bld: 136 mg/dL — ABNORMAL HIGH (ref 70–99)
Potassium: 3.6 meq/L (ref 3.5–5.1)
Sodium: 139 meq/L (ref 135–145)

## 2011-01-14 LAB — CARDIAC PANEL(CRET KIN+CKTOT+MB+TROPI)
CK, MB: 4.9 ng/mL — ABNORMAL HIGH (ref 0.3–4.0)
Relative Index: 3.1 — ABNORMAL HIGH (ref 0.0–2.5)
Relative Index: 3.1 — ABNORMAL HIGH (ref 0.0–2.5)
Relative Index: 3.1 — ABNORMAL HIGH (ref 0.0–2.5)
Total CK: 118 U/L (ref 7–177)
Total CK: 160 U/L (ref 7–177)
Troponin I: 0.01 ng/mL (ref 0.00–0.06)
Troponin I: 0.01 ng/mL (ref 0.00–0.06)

## 2011-01-14 LAB — BASIC METABOLIC PANEL
Calcium: 8.8 mg/dL (ref 8.4–10.5)
Creatinine, Ser: 0.85 mg/dL (ref 0.4–1.2)
GFR calc Af Amer: >60 mL/min (ref >60)
GFR calc non Af Amer: >60 mL/min (ref >60)

## 2011-01-14 LAB — CK TOTAL AND CKMB (NOT AT ARMC)
CK, MB: 7 ng/mL — ABNORMAL HIGH (ref 0.3–4.0)
Total CK: 209 U/L — ABNORMAL HIGH (ref 7–177)

## 2011-01-14 LAB — PROTIME-INR
INR: 1 (ref 0.00–1.49)
Prothrombin Time: 13.4 s (ref 11.6–15.2)

## 2011-01-14 LAB — APTT: aPTT: 32 s (ref 24–37)

## 2011-01-20 LAB — CBC
Hemoglobin: 12.3
RDW: 13.1

## 2011-01-20 LAB — DIFFERENTIAL
Basophils Absolute: 0.1
Lymphocytes Relative: 21
Monocytes Absolute: 0.8 — ABNORMAL HIGH
Neutro Abs: 5.9

## 2011-01-20 LAB — POCT CARDIAC MARKERS
CKMB, poc: 2.3
Myoglobin, poc: 178
Operator id: 4295
Troponin i, poc: <0.05

## 2011-01-20 LAB — BASIC METABOLIC PANEL
Calcium: 8.8
GFR calc Af Amer: >60
GFR calc non Af Amer: 53 — ABNORMAL LOW
Glucose, Bld: 238 — ABNORMAL HIGH
Sodium: 133 — ABNORMAL LOW

## 2011-01-27 LAB — URINALYSIS, ROUTINE W REFLEX MICROSCOPIC
Glucose, UA: 100 — AB
Nitrite: NEGATIVE
pH: 6

## 2011-01-27 LAB — BASIC METABOLIC PANEL
BUN: 22
BUN: 27 — ABNORMAL HIGH
Calcium: 8.9
Calcium: 9.4
Chloride: 105
Creatinine, Ser: 0.82
Creatinine, Ser: 0.98
GFR calc Af Amer: >60
GFR calc non Af Amer: 56 — ABNORMAL LOW
GFR calc non Af Amer: >60
Potassium: 4.9

## 2011-01-27 LAB — LIPID PANEL
HDL: 42
Triglycerides: 193 — ABNORMAL HIGH
VLDL: 39

## 2011-01-27 LAB — RENAL FUNCTION PANEL
Albumin: 3.4 — ABNORMAL LOW
Calcium: 9.7
Chloride: 98
Creatinine, Ser: 0.89
GFR calc Af Amer: >60
GFR calc non Af Amer: >60
Sodium: 139

## 2011-01-27 LAB — URINALYSIS, MICROSCOPIC ONLY
Bilirubin Urine: NEGATIVE
Glucose, UA: NEGATIVE
Hgb urine dipstick: NEGATIVE
Ketones, ur: NEGATIVE
Protein, ur: NEGATIVE
Urine-Other: NONE SEEN
Urobilinogen, UA: 0.2

## 2011-01-27 LAB — CK TOTAL AND CKMB (NOT AT ARMC)
CK, MB: 10.1 — ABNORMAL HIGH
CK, MB: 9.4 — ABNORMAL HIGH
Relative Index: 1.3
Relative Index: 1.4
Relative Index: 1.5
Total CK: 697 — ABNORMAL HIGH
Total CK: 724 — ABNORMAL HIGH

## 2011-01-27 LAB — POCT CARDIAC MARKERS
CKMB, poc: 9
Operator id: 4074
Troponin i, poc: <0.05

## 2011-01-27 LAB — DIFFERENTIAL
Lymphocytes Relative: 22
Lymphs Abs: 2.1
Neutro Abs: 6.4
Neutrophils Relative %: 67

## 2011-01-27 LAB — TSH: TSH: 2.246

## 2011-01-27 LAB — CARDIAC PANEL(CRET KIN+CKTOT+MB+TROPI)
CK, MB: 10.3 — ABNORMAL HIGH
Relative Index: 1.4
Total CK: 752 — ABNORMAL HIGH

## 2011-01-27 LAB — CBC
Platelets: 168
WBC: 9.6

## 2011-01-27 LAB — SEDIMENTATION RATE: Sed Rate: 5

## 2011-01-27 LAB — CK: Total CK: 749 — ABNORMAL HIGH

## 2011-01-27 LAB — MICROALBUMIN, URINE: Microalb, Ur: 0.9

## 2011-01-27 LAB — TROPONIN I: Troponin I: 0.03

## 2011-02-03 NOTE — H&P (Signed)
NAMEJENNEY, [name] NO.:  [id number]  MEDICAL RECORD NO.:  [id number]  LOCATION:  [facility]                         FACILITY:  [facility]  PHYSICIAN:  Therisa Doyne, MD    DATE OF BIRTH:  03-03-1933  DATE OF ADMISSION:  11/29/2010 DATE OF DISCHARGE:                             HISTORY & PHYSICAL   PRIMARY CARDIOLOGIST:  [name]. Excell Seltzer, MD  REASON FOR ADMISSION:  Chest pain.  CHIEF COMPLAINT:  Chest pain.  HISTORY OF PRESENT ILLNESS:  A 77 year old African American female with past medical history significant for coronary artery disease, history of coronary artery bypass grafting who presents with left-sided chest pain today that occurred with exertion.  Historically, she has a history of multivessel stenting with Promus stents to the proximal RCA, proximal LAD, and proximal circumflex in January 2009.  She had severe in-stent restenosis and ultimately underwent five-vessel CABG in November 2011. At that time, she had a LIMA to the LAD, a saphenous vein graft was sequential to OM-1 and Om-2, a vein graft to the diagonal, and a vein graft to the PDA.  Since that time, she has done fairly well.  Today, she was in her usual state of health and she began developing sharp, left-sided chest pain that radiated to her arm and was associated with shortness of breath.  This occurred intermittently throughout the day today and was associated with walking when she was up walking around her house.  The pain relieved with rest.  Because of these symptoms, she came to the emergency department for evaluation.  She denies any further left-sided chest pain at this time as she is sitting still.  The patient is also complaining of retrosternal chest discomfort around her sternotomy incision site.  This has been going on for approximately 1 month and is different than the left-sided pain that she had today. She denies any tachy palpitations or syncope.  She denies any  orthopnea, PND, or lower extremity edema.  PAST MEDICAL HISTORY: 1. Coronary artery disease status post multivessel stenting in 2009     and status post five-vessel CABG in 2011.  Please see history     present illness for details. 2. Hypertension. 3. Hyperlipidemia. 4. Diabetes. 5. Peripheral vascular disease. 6. Hypothyroidism. 7. Chronic back pain.  SOCIAL HISTORY:  The patient lives at home by herself.  She has a history of smoking but quit in the past.  FAMILY HISTORY:  Negative for myocardial infarctions.  ALLERGIES: 1. PENICILLIN. 2. SULFA. 3. CODEINE. 4. VICODIN. 5. SHELLFISH. 6. METFORMIN. 7. LOVASTATIN. 8. DILTIAZEM.  MEDICATIONS:  Please see medication reconciliation list.  Pertinent cardiac meds included aspirin 81 mg daily, Cozaar, Norvasc, and Lipitor.  REVIEW OF SYSTEMS:  All systems are reviewed and are negative except as mentioned above in history of present illness.  PHYSICAL EXAMINATION:  VITAL SIGNS:  Temperature afebrile, blood pressure is 140/70, respirations 18, oxygen saturation 98% on room air, and pulse 85 beats per minute. GENERAL:  No acute distress. HEENT:  Normocephalic and atraumatic.  Pupils equal, round, and reactive to light and accommodation.  Oropharynx is pink and moist without lesions. NECK:  Supple.  No lymphadenopathy.  No  jugular venous distention.  No masses. CARDIOVASCULAR:  Regular rate and rhythm with 1/6 systolic ejection murmur heard at the base. LUNGS:  Clear to auscultation bilaterally. ABDOMEN:  Positive bowel sounds.  Nontender and nondistended. EXTREMITIES:  No clubbing, cyanosis, or edema. MUSCULOSKELETAL:  There is tenderness to palpation over the sternum. NEUROLOGIC:  No focal deficits. PSYCHIATRIC:  Normal affect.  PERTINENT LABORATORY DATA:  Hemoglobin 11.5, platelets 114.  BUN 33, creatinine 1.2, glucose 162.  Troponin 0.01 with a repeat troponin of 0.00.  INR 0.99.  BNP 151.  EKG from 2142 demonstrates  sinus rhythm at 74 beats per minute, low voltage, no ST-T wave changes.  No significant change from previous EKGs.  Chest x-ray shows left basal atelectasis, but no acute cardiopulmonary disease.  IMPRESSION AND PLAN:  A 77 year old African American female with past medical history significant for coronary artery disease status post multivessel stenting in 2009, status post coronary artery bypass grafting in 2011, diabetes, hypertension, and hyperlipidemia who presents for evaluation of left-sided chest pain that occurred today with exertion. 1. We will admit the patient to [facility] under Dr. Earmon Phoenix     care. 2. Left-sided chest pain.  Her history is somewhat concerning for     progressive disease in one of her bypass grafts; however, this     could just be musculoskeletal pain.  Certainly, she has significant     amount of retrosternal musculoskeletal pain, however, left side     pain is different that occurred today.  As this may represent     unstable angina, we will place her on heparin drip until we have     further information.  We will increase her aspirin to 325 mg daily     and start her on a low-dose beta-blocker.  We will continue on her     home statin.  We will cycle cardiac enzymes.  Should she remain     stable and had no further chest pain, should her cardiac enzymes     remain negative, I think it will be reasonable to do a nuclear     stress test in the morning. 3. Hypertension.  Under moderate control.  We will monitor this with     the addition of Lopressor.  She will continue her other home     antihypertensives. 4. Hyperlipidemia.  Continue Lipitor.  Check fasting profile. 5. Diabetes.  As she will be n.p.o., we will place her on sliding     scale insulin, give her a very low dose of basal     insulin with Lantus 10 units at bedtime.  Check hemoglobin A1c. 6. Fluids, electrolytes, and nutrition.  Saline lock IV fluids.     Electrolytes are stable and  n.p.o. 7. Deep vein thrombosis:  It is not indicated as the patient will be     on heparin drip.     Therisa Doyne, MD     SJT/MEDQ  D:  11/30/2010  T:  11/30/2010  Job:  [id number]  Electronically Signed by Aldona Bar MD on 02/03/2011 06:07:15 AM

## 2011-02-23 ENCOUNTER — Telehealth: Payer: Self-pay | Admitting: Cardiovascular Disease

## 2011-02-23 NOTE — Telephone Encounter (Signed)
Pt calling stating that she would generic plavix 75 mg called into [facility] on [address]. Please return pt call to discuss further if necessary.

## 2011-02-25 MED ORDER — CLOPIDOGREL BISULFATE 75 MG PO TABS: 75.0000 mg | ORAL_TABLET | Freq: Every day | ORAL | Status: DC

## 2011-04-18 ENCOUNTER — Ambulatory Visit (INDEPENDENT_AMBULATORY_CARE_PROVIDER_SITE_OTHER): Payer: Medicare Other | Admitting: Endocrinology

## 2011-04-18 ENCOUNTER — Other Ambulatory Visit (INDEPENDENT_AMBULATORY_CARE_PROVIDER_SITE_OTHER): Payer: Medicare Other

## 2011-04-18 ENCOUNTER — Encounter: Payer: Self-pay | Admitting: Endocrinology

## 2011-04-18 VITALS — BP 140/64 | HR 77 | Temp 98.2°F | Wt 226.2 lb

## 2011-04-18 DIAGNOSIS — E119 Type 2 diabetes mellitus without complications: Secondary | ICD-10-CM

## 2011-04-18 NOTE — Progress Notes (Signed)
Subjective:    Patient ID: [name], female    DOB: Jul 15, 1932, 78 y.o.   MRN: [id number]  HPI Pt returns for f/u of insulin-requiring DM (1988).  no cbg record, but states cbg's vary from 52-200.  It is in general higher as the day goes on.  pt states she feels well in general, except for left arm pain.   Past Medical History  Diagnosis Date  . CAD (coronary artery disease)     Multivessel s/p PCI w/DES 2008 and CABG 2011  . HTN (hypertension)   . Type II or unspecified type diabetes mellitus without mention of complication, not stated as uncontrolled   . PVD (peripheral vascular disease)   . Diverticulosis of colon   . Morbid obesity   . Gout   . Depression   . Osteoarthritis   . Osteopenia   . Hyperlipidemia   . LBP (low back pain)     Lumbar disc disease/lumbar spinal stenosis  . Disc disease, degenerative, cervical   . History of thrombocytopenia   . Anxiety   . GERD (gastroesophageal reflux disease)   . Barrett esophagus   . Gastroparesis   . Helicobacter pylori gastritis   . Allergic rhinitis   . Anemia     Past Surgical History  Procedure Date  . Coronary stent placement     Drug-eluting stent to the left anterior descending, circumflex and right coronary artery in Jan 2009  . Cholecystectomy   . Abdominal hysterectomy   . Tubal ligation   . Ovarian cyst removal   . Coronary stent placement     x 3 Dr Excell Seltzer    History   Social History  . Marital Status: Widowed    Spouse Name: N/A    Number of Children: N/A  . Years of Education: N/A   Occupational History  . RETIRED    Social History Main Topics  . Smoking status: Former Games developer  . Smokeless tobacco: Not on file   Comment: quit 30 yrs ago  . Alcohol Use: No  . Drug Use: No  . Sexually Active: Not on file   Other Topics Concern  . Not on file   Social History Narrative   Widowed 2004    Current Outpatient Prescriptions on File Prior to Visit  Medication Sig Dispense Refill  .  allopurinol (ZYLOPRIM) 100 MG tablet TAKE ONE TABLET BY MOUTH EVERY DAY  90 tablet  3  . aspirin 81 MG chewable tablet Chew 81 mg by mouth daily.        Jerrell Belfast Lancet Super Thin 30G MISC as directed.        . clopidogrel (PLAVIX) 75 MG tablet Take 1 tablet (75 mg total) by mouth daily.  30 tablet  11  . diclofenac (VOLTAREN) 50 MG EC tablet TAKE ONE TABLET  BY MOUTH TWICE DAILY AS NEEDED FOR PAIN  60 tablet  2  . Ergocalciferol (VITAMIN D2) 400 UNITS TABS Take by mouth daily.        . furosemide (LASIX) 80 MG tablet Take 80 mg by mouth every morning.        Marland Kitchen glucose blood (ONE TOUCH ULTRA TEST) test strip 1 each by Other route 2 (two) times daily as needed. Use as instructed       . iron polysaccharides (NIFEREX) 150 MG capsule Take 150 mg by mouth daily.        Marland Kitchen levocetirizine (XYZAL) 5 MG tablet Take 5 mg by mouth daily  as needed. For allergies       . levothyroxine (SYNTHROID, LEVOTHROID) 100 MCG tablet TAKE ONE TABLET BY MOUTH EVERY DAY  90 tablet  1  . losartan (COZAAR) 100 MG tablet Take 100 mg by mouth daily.        . methocarbamol (ROBAXIN) 500 MG tablet Take 500 mg by mouth every 6 (six) hours as needed.        . nitroGLYCERIN (NITROSTAT) 0.4 MG SL tablet Place 0.4 mg under the tongue as directed.        Marland Kitchen omeprazole (PRILOSEC) 20 MG capsule Take 40 mg by mouth daily.        . potassium chloride SA (K-DUR,KLOR-CON) 20 MEQ tablet Take 20 mEq by mouth 2 (two) times daily.        Marland Kitchen amLODipine (NORVASC) 10 MG tablet TAKE ONE TABLET BY MOUTH EVERY DAY  30 tablet  5  . pravastatin (PRAVACHOL) 40 MG tablet Take 1 tablet (40 mg total) by mouth every evening.  90 tablet  3    Allergies  Allergen Reactions  . Ciprofloxacin Nausea And Vomiting    syncope  . Diltiazem Hcl     REACTION: low heart rate  . Lovastatin     REACTION: rhabdo  . Metformin   . Penicillins   . Sulfonamide Derivatives     Family History  Problem Relation Age of Onset  . Diabetes Mother   . Hypertension  Mother   . Stroke Father   . Coronary artery disease Other     BP 140/64  Pulse 77  Temp(Src) 98.2 F (36.8 C) (Oral)  Wt 226 lb 3.2 oz (102.604 kg)  SpO2 97%  Review of Systems Denies loc.    Objective:   Physical Exam VITAL SIGNS:  See vs page GENERAL: no distress Pulses: dorsalis pedis intact bilat.   Feet: no deformity, except that the left 2nd toe overlaps the 3rd.  no ulcer on the feet.  feet are of normal color and temp.  no edema.  There is bilateral onychomycosis. Neuro: sensation is intact to touch on the feet Lab Results  Component Value Date   HGBA1C 7.4* 04/18/2011      Assessment & Plan:  DM, this is the best control this pt should aim for, given this regimen, which does match insulin to her changing needs throughout the day.

## 2011-04-18 NOTE — Patient Instructions (Addendum)
good diet and exercise habits significanly improve the control of your diabetes.  please let me know if you wish to be referred to a dietician.  high blood sugar is very risky to your health.  you should see an eye doctor every year. controlling your blood pressure and cholesterol drastically reduces the damage diabetes does to your body.  this also applies to quitting smoking.  please discuss these with your doctor.  you should take an aspirin every day, unless you have been advised by a doctor not to. check your blood sugar 2 times a day.  vary the time of day when you check, between before the 3 meals, and at bedtime.  also check if you have symptoms of your blood sugar being too high or too low.  please keep a record of the readings and bring it to your next appointment here.  please call us sooner if your blood sugar goes below 70, or if it stays over 200. Please come back for a follow-up appointment in 3 months blood tests are being requested for you today.  please call [phone] to hear your test results.  You will be prompted to enter the 9-digit "MRN" number that appears at the top left of this page, followed by #.  Then you will hear the message. pending the test results, please change the insulin to 90 units each morning, and 10 units in the evening.   (update: i left message on phone-tree:  rx as we discussed)

## 2011-04-23 ENCOUNTER — Other Ambulatory Visit: Payer: Self-pay | Admitting: Internal Medicine

## 2011-06-02 ENCOUNTER — Encounter (HOSPITAL_COMMUNITY): Payer: Self-pay | Admitting: Pharmacy Technician

## 2011-06-10 ENCOUNTER — Encounter (HOSPITAL_COMMUNITY)
Admission: RE | Admit: 2011-06-10 | Discharge: 2011-06-10 | Disposition: A | Discharge status: Home / Self Care | Payer: Medicare Other | Source: Ambulatory Visit | Attending: Orthopedic Surgery | Admitting: Orthopedic Surgery

## 2011-06-10 ENCOUNTER — Encounter (HOSPITAL_COMMUNITY): Payer: Self-pay

## 2011-06-10 ENCOUNTER — Other Ambulatory Visit: Payer: Self-pay | Admitting: Endocrinology

## 2011-06-10 ENCOUNTER — Encounter (HOSPITAL_COMMUNITY)
Admission: RE | Admit: 2011-06-10 | Discharge: 2011-06-10 | Disposition: A | Discharge status: Home / Self Care | Payer: Medicare Other | Source: Ambulatory Visit | Attending: Anesthesiology | Admitting: Anesthesiology

## 2011-06-10 ENCOUNTER — Other Ambulatory Visit: Payer: Self-pay | Admitting: Orthopedic Surgery

## 2011-06-10 HISTORY — DX: Personal history of other diseases of the digestive system: Z87.19

## 2011-06-10 HISTORY — DX: Hypothyroidism, unspecified: E03.9

## 2011-06-10 HISTORY — DX: Acute myocardial infarction, unspecified: I21.9

## 2011-06-10 LAB — COMPREHENSIVE METABOLIC PANEL
AST: 16 U/L (ref 0–37)
Albumin: 3.6 g/dL (ref 3.5–5.2)
Alkaline Phosphatase: 111 U/L (ref 39–117)
CO2: 28 mEq/L (ref 19–32)
Chloride: 103 mEq/L (ref 96–112)
GFR calc non Af Amer: 48 mL/min — ABNORMAL LOW (ref >90)
Potassium: 4 mEq/L (ref 3.5–5.1)
Total Bilirubin: 0.3 mg/dL (ref 0.3–1.2)

## 2011-06-10 LAB — URINALYSIS, MICROSCOPIC ONLY
Bilirubin Urine: NEGATIVE
Glucose, UA: NEGATIVE mg/dL
Hgb urine dipstick: NEGATIVE
Ketones, ur: NEGATIVE mg/dL
Protein, ur: NEGATIVE mg/dL

## 2011-06-10 LAB — CBC
MCH: 28.9 pg (ref 26.0–34.0)
MCHC: 33.1 g/dL (ref 30.0–36.0)
MCV: 87.3 fL (ref 78.0–100.0)
Platelets: 143 10*3/uL — ABNORMAL LOW (ref 150–400)
RDW: 13.9 % (ref 11.5–15.5)

## 2011-06-10 NOTE — Progress Notes (Signed)
NO ORDERS AT PAT, OFFFICE OF DR Montez Morita NOT OPEN TIL 1000.

## 2011-06-10 NOTE — Progress Notes (Signed)
SPOKE WITH DR Montez Morita RE NEEDING ORDERS, HE WILL PLACE IN EPIC AND ALSO GAVE VERBAL.  DR Montez Morita STATED PATIENT SHOULD TAKE LAST DOSE OF PLAVIX Sunday AND CONTINUE ASPIRIN (PATIENT HAS BEEN INSTRUCTED) .

## 2011-06-10 NOTE — Pre-Procedure Instructions (Signed)
20 [name]  06/10/2011   Your procedure is scheduled on:   Thursday 06/16/11   Report to [facility] at 530 AM.  Call this number if you have problems the morning of surgery: [phone]   Remember:   Do not eat food:After Midnight.  May have clear liquids: up to 4 Hours before arrival.  Clear liquids include soda, tea, black coffee, apple or grape juice, broth.  Take these medicines the morning of surgery with A SIP OF WATER:  ALLOPURINOL, SYNTHROID, PRILOSEC,  KDUR (POTASSIUM)   Do not wear jewelry, make-up or nail polish.  Do not wear lotions, powders, or perfumes. You may wear deodorant.  Do not shave 48 hours prior to surgery.  Do not bring valuables to the hospital.  Contacts, dentures or bridgework may not be worn into surgery.  Leave suitcase in the car. After surgery it may be brought to your room.  For patients admitted to the hospital, checkout time is 11:00 AM the day of discharge.   Patients discharged the day of surgery will not be allowed to drive home.  Name and phone number of your driver:   Special Instructions: CHG Shower Use Special Wash: 1/2 bottle night before surgery and 1/2 bottle morning of surgery.   Please read over the following fact sheets that you were given: Pain Booklet, MRSA Information and Surgical Site Infection Prevention

## 2011-06-15 ENCOUNTER — Other Ambulatory Visit: Payer: Self-pay | Admitting: Orthopedic Surgery

## 2011-06-15 NOTE — H&P (Signed)
  THIS A 78Y/O FEMALE WHO HAS BEEN TREATED FOR IMPINGEMENT SYNDROME OF HER LEFT SHOULDER WITH PROGRESSIVE LOSS OF FUNCTION AND PAIN ON ACTIVITY. PATIENT HAS BEEN TREATED WITH ANTI-INFLAMMATORY,PAIN MEDICATIN, AND SHOWER SHOULDER EXERCISES. ZOX:[id number], T&A,CHOLECYSTECTOMY.OPEN HEART SURGERY,DIABETES MELLITUS .ALLERGIES::CODIEN,LOVOSTATIN,METFORMIN,PCN,SULFUR, BJY:NWGNFAOZ JOINT PAIN, GERD SYMPTOMS, MEDS:SEE LIST P.E.:ALERT AND ORIENTED, WT. 217LB,HT. 5'7", P 82, LEFT SHOULDER: TENDER ANTERIORILY AND LATERALLY, SUBACROMIAL CREPITUS WITH PAIN ABD ABOVE 75 DEGREES AND INCREASED PAIN ON RESISTIVE ABD. X-R OF THE LEFT SHOULDER REVEAL NARROW SUBACROMIAL SPACE WITH BONE OVER GROWTH IMPRESSION :IMPINGMENT SYNDROME WITH POSSIBLE CUFF TEAR.PLAN:ARTHROSCOPY LEFT SHOULDER AND POSSIBLE CUFF REPAIR.

## 2011-06-16 ENCOUNTER — Encounter (HOSPITAL_COMMUNITY): Payer: Self-pay | Admitting: Anesthesiology

## 2011-06-16 ENCOUNTER — Other Ambulatory Visit: Payer: Self-pay | Admitting: Orthopedic Surgery

## 2011-06-16 ENCOUNTER — Observation Stay (HOSPITAL_COMMUNITY)
Admission: RE | Admit: 2011-06-16 | Discharge: 2011-06-17 | Disposition: A | Discharge status: Home / Self Care | Payer: Medicare Other | Source: Ambulatory Visit | Attending: Orthopedic Surgery | Admitting: Orthopedic Surgery

## 2011-06-16 ENCOUNTER — Telehealth: Payer: Self-pay | Admitting: Cardiovascular Disease

## 2011-06-16 ENCOUNTER — Encounter (HOSPITAL_COMMUNITY): Payer: Self-pay | Admitting: *Deleted

## 2011-06-16 ENCOUNTER — Encounter (HOSPITAL_COMMUNITY)
Admission: RE | Disposition: A | Discharge status: Home / Self Care | Payer: Self-pay | Source: Ambulatory Visit | Attending: Orthopedic Surgery

## 2011-06-16 ENCOUNTER — Ambulatory Visit (HOSPITAL_COMMUNITY): Payer: Medicare Other | Admitting: Anesthesiology

## 2011-06-16 DIAGNOSIS — M1990 Unspecified osteoarthritis, unspecified site: Secondary | ICD-10-CM

## 2011-06-16 DIAGNOSIS — K219 Gastro-esophageal reflux disease without esophagitis: Secondary | ICD-10-CM

## 2011-06-16 DIAGNOSIS — Z8719 Personal history of other diseases of the digestive system: Secondary | ICD-10-CM

## 2011-06-16 DIAGNOSIS — I2510 Atherosclerotic heart disease of native coronary artery without angina pectoris: Secondary | ICD-10-CM | POA: Insufficient documentation

## 2011-06-16 DIAGNOSIS — M549 Dorsalgia, unspecified: Secondary | ICD-10-CM

## 2011-06-16 DIAGNOSIS — B029 Zoster without complications: Secondary | ICD-10-CM

## 2011-06-16 DIAGNOSIS — Z01818 Encounter for other preprocedural examination: Secondary | ICD-10-CM | POA: Insufficient documentation

## 2011-06-16 DIAGNOSIS — M545 Low back pain: Secondary | ICD-10-CM

## 2011-06-16 DIAGNOSIS — E785 Hyperlipidemia, unspecified: Secondary | ICD-10-CM

## 2011-06-16 DIAGNOSIS — M25519 Pain in unspecified shoulder: Secondary | ICD-10-CM

## 2011-06-16 DIAGNOSIS — M949 Disorder of cartilage, unspecified: Secondary | ICD-10-CM

## 2011-06-16 DIAGNOSIS — R079 Chest pain, unspecified: Secondary | ICD-10-CM

## 2011-06-16 DIAGNOSIS — R42 Dizziness and giddiness: Secondary | ICD-10-CM

## 2011-06-16 DIAGNOSIS — L299 Pruritus, unspecified: Secondary | ICD-10-CM

## 2011-06-16 DIAGNOSIS — I739 Peripheral vascular disease, unspecified: Secondary | ICD-10-CM | POA: Insufficient documentation

## 2011-06-16 DIAGNOSIS — M109 Gout, unspecified: Secondary | ICD-10-CM

## 2011-06-16 DIAGNOSIS — I5031 Acute diastolic (congestive) heart failure: Secondary | ICD-10-CM

## 2011-06-16 DIAGNOSIS — Z0000 Encounter for general adult medical examination without abnormal findings: Secondary | ICD-10-CM

## 2011-06-16 DIAGNOSIS — J309 Allergic rhinitis, unspecified: Secondary | ICD-10-CM

## 2011-06-16 DIAGNOSIS — I252 Old myocardial infarction: Secondary | ICD-10-CM | POA: Insufficient documentation

## 2011-06-16 DIAGNOSIS — M719 Bursopathy, unspecified: Secondary | ICD-10-CM | POA: Insufficient documentation

## 2011-06-16 DIAGNOSIS — M5137 Other intervertebral disc degeneration, lumbosacral region: Secondary | ICD-10-CM

## 2011-06-16 DIAGNOSIS — M67919 Unspecified disorder of synovium and tendon, unspecified shoulder: Secondary | ICD-10-CM | POA: Insufficient documentation

## 2011-06-16 DIAGNOSIS — M5030 Other cervical disc degeneration, unspecified cervical region: Secondary | ICD-10-CM

## 2011-06-16 DIAGNOSIS — F411 Generalized anxiety disorder: Secondary | ICD-10-CM

## 2011-06-16 DIAGNOSIS — M7542 Impingement syndrome of left shoulder: Secondary | ICD-10-CM

## 2011-06-16 DIAGNOSIS — M48061 Spinal stenosis, lumbar region without neurogenic claudication: Secondary | ICD-10-CM

## 2011-06-16 DIAGNOSIS — M25819 Other specified joint disorders, unspecified shoulder: Principal | ICD-10-CM | POA: Insufficient documentation

## 2011-06-16 DIAGNOSIS — I10 Essential (primary) hypertension: Secondary | ICD-10-CM | POA: Insufficient documentation

## 2011-06-16 DIAGNOSIS — R609 Edema, unspecified: Secondary | ICD-10-CM

## 2011-06-16 DIAGNOSIS — I495 Sick sinus syndrome: Secondary | ICD-10-CM

## 2011-06-16 DIAGNOSIS — Z01812 Encounter for preprocedural laboratory examination: Secondary | ICD-10-CM | POA: Insufficient documentation

## 2011-06-16 DIAGNOSIS — R109 Unspecified abdominal pain: Secondary | ICD-10-CM

## 2011-06-16 DIAGNOSIS — E119 Type 2 diabetes mellitus without complications: Secondary | ICD-10-CM | POA: Insufficient documentation

## 2011-06-16 DIAGNOSIS — I5033 Acute on chronic diastolic (congestive) heart failure: Secondary | ICD-10-CM

## 2011-06-16 DIAGNOSIS — D649 Anemia, unspecified: Secondary | ICD-10-CM

## 2011-06-16 DIAGNOSIS — M25512 Pain in left shoulder: Secondary | ICD-10-CM

## 2011-06-16 DIAGNOSIS — I6529 Occlusion and stenosis of unspecified carotid artery: Secondary | ICD-10-CM

## 2011-06-16 DIAGNOSIS — F329 Major depressive disorder, single episode, unspecified: Secondary | ICD-10-CM

## 2011-06-16 DIAGNOSIS — R809 Proteinuria, unspecified: Secondary | ICD-10-CM

## 2011-06-16 DIAGNOSIS — R490 Dysphonia: Secondary | ICD-10-CM

## 2011-06-16 DIAGNOSIS — IMO0002 Reserved for concepts with insufficient information to code with codable children: Secondary | ICD-10-CM

## 2011-06-16 DIAGNOSIS — K5730 Diverticulosis of large intestine without perforation or abscess without bleeding: Secondary | ICD-10-CM

## 2011-06-16 DIAGNOSIS — K3184 Gastroparesis: Secondary | ICD-10-CM

## 2011-06-16 HISTORY — PX: SHOULDER ARTHROSCOPY: SHX128

## 2011-06-16 LAB — GLUCOSE, CAPILLARY
Glucose-Capillary: 77 mg/dL (ref 70–99)
Glucose-Capillary: 194 mg/dL — ABNORMAL HIGH (ref 70–99)

## 2011-06-16 SURGERY — ARTHROSCOPY, SHOULDER
Anesthesia: General | Site: Shoulder | Laterality: Left | Wound class: Clean

## 2011-06-16 MED ORDER — FENTANYL CITRATE 0.05 MG/ML IJ SOLN
INTRAMUSCULAR | Status: DC | PRN
  Administered 2011-06-16: 50 ug via INTRAVENOUS
  Administered 2011-06-16: 100 ug via INTRAVENOUS

## 2011-06-16 MED ORDER — FUROSEMIDE 80 MG PO TABS
80.0000 mg | ORAL_TABLET | Freq: Every day | ORAL | Status: DC
Start: 2011-06-16 — End: 2011-06-17
  Administered 2011-06-16: 80 mg via ORAL
  Filled 2011-06-16 (×2): qty 1

## 2011-06-16 MED ORDER — SODIUM CHLORIDE 0.9 % IR SOLN
Status: DC | PRN
  Administered 2011-06-16: 6000 mL

## 2011-06-16 MED ORDER — WARFARIN VIDEO
Freq: Once | Status: AC
  Administered 2011-06-17: 10:00:00

## 2011-06-16 MED ORDER — LACTATED RINGERS IV SOLN
INTRAVENOUS | Status: DC | PRN
  Administered 2011-06-16 (×2): via INTRAVENOUS

## 2011-06-16 MED ORDER — MIDAZOLAM HCL 5 MG/5ML IJ SOLN
INTRAMUSCULAR | Status: DC | PRN
  Administered 2011-06-16: 1 mg via INTRAVENOUS

## 2011-06-16 MED ORDER — ACETAMINOPHEN 325 MG PO TABS
650.0000 mg | ORAL_TABLET | Freq: Four times a day (QID) | ORAL | Status: DC | PRN
  Administered 2011-06-17: 650 mg via ORAL
  Filled 2011-06-16: qty 2

## 2011-06-16 MED ORDER — ONDANSETRON HCL 4 MG/2ML IJ SOLN: 4.0000 mg | Freq: Four times a day (QID) | INTRAMUSCULAR | Status: DC | PRN

## 2011-06-16 MED ORDER — LEVOTHYROXINE SODIUM 100 MCG PO TABS
100.0000 ug | ORAL_TABLET | Freq: Every day | ORAL | Status: DC
  Administered 2011-06-17: 100 ug via ORAL
  Filled 2011-06-16 (×2): qty 1

## 2011-06-16 MED ORDER — INSULIN NPH (HUMAN) (ISOPHANE) 100 UNIT/ML ~~LOC~~ SUSP
20.0000 [IU] | Freq: Every day | SUBCUTANEOUS | Status: DC
  Administered 2011-06-16: 20 [IU] via SUBCUTANEOUS
  Filled 2011-06-16 (×3): qty 10

## 2011-06-16 MED ORDER — INSULIN NPH (HUMAN) (ISOPHANE) 100 UNIT/ML ~~LOC~~ SUSP
70.0000 [IU] | Freq: Every day | SUBCUTANEOUS | Status: DC
  Administered 2011-06-17: 70 [IU] via SUBCUTANEOUS
  Filled 2011-06-16: qty 10

## 2011-06-16 MED ORDER — ACETAMINOPHEN 10 MG/ML IV SOLN
INTRAVENOUS | Status: AC
  Filled 2011-06-16: qty 100

## 2011-06-16 MED ORDER — ONDANSETRON HCL 4 MG PO TABS: 4.0000 mg | ORAL_TABLET | Freq: Four times a day (QID) | ORAL | Status: DC | PRN

## 2011-06-16 MED ORDER — COUMADIN BOOK
Freq: Once | Status: DC
  Filled 2011-06-16: qty 1

## 2011-06-16 MED ORDER — MENTHOL 3 MG MT LOZG: 1.0000 | LOZENGE | OROMUCOSAL | Status: DC | PRN

## 2011-06-16 MED ORDER — WARFARIN SODIUM 5 MG PO TABS
5.0000 mg | ORAL_TABLET | Freq: Once | ORAL | Status: AC
  Administered 2011-06-16: 5 mg via ORAL
  Filled 2011-06-16: qty 1

## 2011-06-16 MED ORDER — ALLOPURINOL 100 MG PO TABS
100.0000 mg | ORAL_TABLET | Freq: Every day | ORAL | Status: DC
  Administered 2011-06-16: 100 mg via ORAL
  Filled 2011-06-16 (×2): qty 1

## 2011-06-16 MED ORDER — LABETALOL HCL 5 MG/ML IV SOLN
5.0000 mg | INTRAVENOUS | Status: DC | PRN
  Administered 2011-06-16: 5 mg via INTRAVENOUS

## 2011-06-16 MED ORDER — LOSARTAN POTASSIUM 50 MG PO TABS
100.0000 mg | ORAL_TABLET | Freq: Every day | ORAL | Status: DC
  Administered 2011-06-16: 100 mg via ORAL
  Filled 2011-06-16 (×2): qty 2

## 2011-06-16 MED ORDER — LORATADINE 10 MG PO TABS
10.0000 mg | ORAL_TABLET | Freq: Every day | ORAL | Status: DC
  Filled 2011-06-16: qty 1

## 2011-06-16 MED ORDER — ACETAMINOPHEN 650 MG RE SUPP: 650.0000 mg | Freq: Four times a day (QID) | RECTAL | Status: DC | PRN

## 2011-06-16 MED ORDER — ACETAMINOPHEN 10 MG/ML IV SOLN
INTRAVENOUS | Status: DC | PRN
  Administered 2011-06-16: 1000 mg via INTRAVENOUS

## 2011-06-16 MED ORDER — WARFARIN - PHARMACIST DOSING INPATIENT: Freq: Every day | Status: DC

## 2011-06-16 MED ORDER — HYDROMORPHONE HCL PF 1 MG/ML IJ SOLN: 0.2500 mg | INTRAMUSCULAR | Status: DC | PRN

## 2011-06-16 MED ORDER — LINAGLIPTIN 5 MG PO TABS
5.0000 mg | ORAL_TABLET | Freq: Every day | ORAL | Status: DC
  Administered 2011-06-17: 5 mg via ORAL
  Filled 2011-06-16 (×2): qty 1

## 2011-06-16 MED ORDER — METOCLOPRAMIDE HCL 10 MG PO TABS: 5.0000 mg | ORAL_TABLET | Freq: Three times a day (TID) | ORAL | Status: DC | PRN

## 2011-06-16 MED ORDER — MORPHINE SULFATE 2 MG/ML IJ SOLN: 0.0500 mg/kg | INTRAMUSCULAR | Status: DC | PRN

## 2011-06-16 MED ORDER — PROPOFOL 10 MG/ML IV EMUL
INTRAVENOUS | Status: DC | PRN
  Administered 2011-06-16: 100 mg via INTRAVENOUS

## 2011-06-16 MED ORDER — PHENOL 1.4 % MT LIQD: 1.0000 | OROMUCOSAL | Status: DC | PRN

## 2011-06-16 MED ORDER — METOCLOPRAMIDE HCL 5 MG/ML IJ SOLN: 5.0000 mg | Freq: Three times a day (TID) | INTRAMUSCULAR | Status: DC | PRN

## 2011-06-16 MED ORDER — ASPIRIN 81 MG PO CHEW
81.0000 mg | CHEWABLE_TABLET | Freq: Every day | ORAL | Status: DC
Start: 2011-06-16 — End: 2011-06-17
  Filled 2011-06-16: qty 1

## 2011-06-16 MED ORDER — POTASSIUM CHLORIDE CRYS ER 20 MEQ PO TBCR
20.0000 meq | EXTENDED_RELEASE_TABLET | Freq: Two times a day (BID) | ORAL | Status: DC
  Administered 2011-06-16: 20 meq via ORAL
  Filled 2011-06-16 (×3): qty 1

## 2011-06-16 MED ORDER — LEVOCETIRIZINE DIHYDROCHLORIDE 5 MG PO TABS: 5.0000 mg | ORAL_TABLET | Freq: Every day | ORAL | Status: DC | PRN

## 2011-06-16 MED ORDER — VANCOMYCIN HCL 1000 MG IV SOLR
1000.0000 mg | INTRAVENOUS | Status: DC | PRN
  Administered 2011-06-16: 1000 mg via INTRAVENOUS

## 2011-06-16 MED ORDER — NITROGLYCERIN 0.4 MG SL SUBL: 0.4000 mg | SUBLINGUAL_TABLET | SUBLINGUAL | Status: DC

## 2011-06-16 MED ORDER — SAXAGLIPTIN-METFORMIN ER 5-1000 MG PO TB24: 1.0000 | ORAL_TABLET | Freq: Every day | ORAL | Status: DC

## 2011-06-16 MED ORDER — POLYSACCHARIDE IRON COMPLEX 150 MG PO CAPS
150.0000 mg | ORAL_CAPSULE | Freq: Every day | ORAL | Status: DC
  Administered 2011-06-16: 150 mg via ORAL
  Filled 2011-06-16 (×2): qty 1

## 2011-06-16 MED ORDER — ONDANSETRON HCL 4 MG/2ML IJ SOLN
INTRAMUSCULAR | Status: DC | PRN
  Administered 2011-06-16: 4 mg via INTRAVENOUS

## 2011-06-16 MED ORDER — METFORMIN HCL ER 500 MG PO TB24
1000.0000 mg | ORAL_TABLET | Freq: Every day | ORAL | Status: DC
  Administered 2011-06-17: 1000 mg via ORAL
  Filled 2011-06-16 (×2): qty 2

## 2011-06-16 MED ORDER — VANCOMYCIN HCL IN DEXTROSE 1-5 GM/200ML-% IV SOLN
1000.0000 mg | Freq: Two times a day (BID) | INTRAVENOUS | Status: AC
  Administered 2011-06-16: 1000 mg via INTRAVENOUS
  Filled 2011-06-16: qty 200

## 2011-06-16 MED ORDER — DEXTROSE-NACL 5-0.45 % IV SOLN
INTRAVENOUS | Status: DC
  Administered 2011-06-16: 14:00:00 via INTRAVENOUS

## 2011-06-16 MED ORDER — ONDANSETRON HCL 4 MG/2ML IJ SOLN: 4.0000 mg | Freq: Once | INTRAMUSCULAR | Status: DC | PRN

## 2011-06-16 MED ORDER — ALLOPURINOL 100 MG PO TABS: 100.0000 mg | ORAL_TABLET | Freq: Every day | ORAL | Status: DC

## 2011-06-16 MED ORDER — INSULIN NPH (HUMAN) (ISOPHANE) 100 UNIT/ML ~~LOC~~ SUSP: 20.0000 [IU] | Freq: Two times a day (BID) | SUBCUTANEOUS | Status: DC

## 2011-06-16 MED ORDER — VANCOMYCIN HCL IN DEXTROSE 1-5 GM/200ML-% IV SOLN
INTRAVENOUS | Status: AC
  Filled 2011-06-16: qty 200

## 2011-06-16 MED ORDER — BUPIVACAINE-EPINEPHRINE PF 0.5-1:200000 % IJ SOLN
INTRAMUSCULAR | Status: DC | PRN
  Administered 2011-06-16: 25 mL

## 2011-06-16 MED ORDER — CLOPIDOGREL BISULFATE 75 MG PO TABS
75.0000 mg | ORAL_TABLET | Freq: Every day | ORAL | Status: DC
  Filled 2011-06-16 (×2): qty 1

## 2011-06-16 MED ORDER — ROCURONIUM BROMIDE 100 MG/10ML IV SOLN
INTRAVENOUS | Status: DC | PRN
  Administered 2011-06-16: 50 mg via INTRAVENOUS

## 2011-06-16 SURGICAL SUPPLY — 41 items
BLADE CUTTER GATOR 3.5 (BLADE) IMPLANT
BLADE GREAT WHITE 4.2 (BLADE) ×2 IMPLANT
BLADE SURG 11 STRL SS (BLADE) ×2 IMPLANT
BUR OVAL 4.0 (BURR) IMPLANT
CLOTH BEACON ORANGE TIMEOUT ST (SAFETY) ×2 IMPLANT
COVER SURGICAL LIGHT HANDLE (MISCELLANEOUS) ×2 IMPLANT
DRAPE STERI 35X30 U-POUCH (DRAPES) ×2 IMPLANT
DRAPE U-SHAPE 47X51 STRL (DRAPES) ×2 IMPLANT
DRSG EMULSION OIL 3X3 NADH (GAUZE/BANDAGES/DRESSINGS) ×2 IMPLANT
DRSG PAD ABDOMINAL 8X10 ST (GAUZE/BANDAGES/DRESSINGS) ×2 IMPLANT
DURAPREP 26ML APPLICATOR (WOUND CARE) ×2 IMPLANT
ELECT MENISCUS 165MM 90D (ELECTRODE) IMPLANT
FILTER STRAW FLUID ASPIR (MISCELLANEOUS) ×2 IMPLANT
GAUZE SPONGE 4X4 12PLY STRL LF (GAUZE/BANDAGES/DRESSINGS) ×2 IMPLANT
GLOVE SS PI 9.0 STRL (GLOVE) ×2 IMPLANT
GOWN PREVENTION PLUS XLARGE (GOWN DISPOSABLE) ×2 IMPLANT
GOWN STRL NON-REIN LRG LVL3 (GOWN DISPOSABLE) ×4 IMPLANT
KIT BASIN OR (CUSTOM PROCEDURE TRAY) ×2 IMPLANT
KIT ROOM TURNOVER OR (KITS) ×2 IMPLANT
MANIFOLD NEPTUNE II (INSTRUMENTS) ×2 IMPLANT
NEEDLE HYPO 21X1.5 SAFETY (NEEDLE) ×2 IMPLANT
NEEDLE HYPO 25GX1X1/2 BEV (NEEDLE) ×2 IMPLANT
NS IRRIG 1000ML POUR BTL (IV SOLUTION) ×2 IMPLANT
PACK SHOULDER (CUSTOM PROCEDURE TRAY) ×2 IMPLANT
PAD ARMBOARD 7.5X6 YLW CONV (MISCELLANEOUS) ×4 IMPLANT
SET ARTHROSCOPY TUBING (MISCELLANEOUS) ×1
SET ARTHROSCOPY TUBING LN (MISCELLANEOUS) ×1 IMPLANT
SLING ARM IMMOBILIZER LRG (SOFTGOODS) ×2 IMPLANT
SPONGE GAUZE 4X4 12PLY (GAUZE/BANDAGES/DRESSINGS) ×2 IMPLANT
SPONGE LAP 4X18 X RAY DECT (DISPOSABLE) IMPLANT
SUT ETHIBOND 2 OS 4 DA (SUTURE) IMPLANT
SUT ETHILON 4 0 PS 2 18 (SUTURE) ×2 IMPLANT
SUT PROLENE 3 0 PS 2 (SUTURE) IMPLANT
SUT VIC AB 0 CTB1 27 (SUTURE) IMPLANT
SUT VIC AB 2-0 FS1 27 (SUTURE) IMPLANT
SYR 3ML LL SCALE MARK (SYRINGE) ×4 IMPLANT
SYR CONTROL 10ML LL (SYRINGE) ×2 IMPLANT
TAPE CLOTH SURG 4X10 WHT LF (GAUZE/BANDAGES/DRESSINGS) ×2 IMPLANT
TOWEL OR 17X24 6PK STRL BLUE (TOWEL DISPOSABLE) ×2 IMPLANT
TOWEL OR 17X26 10 PK STRL BLUE (TOWEL DISPOSABLE) ×2 IMPLANT
WATER STERILE IRR 1000ML POUR (IV SOLUTION) ×2 IMPLANT

## 2011-06-16 NOTE — Anesthesia Preprocedure Evaluation (Addendum)
Anesthesia Evaluation  Patient identified by MRN, date of birth, ID band Patient awake    Reviewed: Allergy & Precautions, H&P , NPO status , Patient's Chart, lab work & pertinent test results  Airway Mallampati: II TM Distance: >3 FB Neck ROM: Full    Dental  (+) Edentulous Upper, Partial Lower and Poor Dentition   Pulmonary  breath sounds clear to auscultation        Cardiovascular Rate:Normal     Neuro/Psych    GI/Hepatic   Endo/Other    Renal/GU      Musculoskeletal   Abdominal   Peds  Hematology   Anesthesia Other Findings   Reproductive/Obstetrics                          Anesthesia Physical Anesthesia Plan  ASA: III  Anesthesia Plan: General   Post-op Pain Management:    Induction: Intravenous  Airway Management Planned: Oral ETT  Additional Equipment:   Intra-op Plan:   Post-operative Plan: Extubation in OR  Informed Consent: I have reviewed the patients History and Physical, chart, labs and discussed the procedure including the risks, benefits and alternatives for the proposed anesthesia with the patient or authorized representative who has indicated his/her understanding and acceptance.   Dental advisory given  Plan Discussed with: CRNA, Anesthesiologist and Surgeon  Anesthesia Plan Comments:         Anesthesia Quick Evaluation

## 2011-06-16 NOTE — H&P (Signed)
[name], [name] NO.:  [id number]  MEDICAL RECORD NO.:  [id number]  LOCATION:  [facility]                         FACILITY:  [facility]  PHYSICIAN:  Myrtie Neither, MD      DATE OF BIRTH:  02/28/1933  DATE OF ADMISSION:  06/16/2011 DATE OF DISCHARGE:                             HISTORY & PHYSICAL   CHIEF COMPLAINT:  Persistent painful left shoulder.  HISTORY OF PRESENT ILLNESS:  This is a 78 year old female being followed in the office for impingement syndrome with shoulder pain progressively worsening with persistence even both on activity as well as at rest. Progressive loss of function with difficulty in reaching at above chest level, as well as resting on the left side.  PAST MEDICAL HISTORY:  Extensive with coronary artery disease, hypertension, type 2 diabetes, peripheral vascular disease, diverticulosis, morbid obesity, depression, osteoarthritis, osteopenia, hyperlipidemia, degenerative disk disease post lumbar and cervical spine, history of thrombocytopenia, anxiety, GERD, Barrett esophagus, gastroparesis, gastritis, allergic rhinitis, anemia, cardiac infarction, hypothyroidism, and history of hiatal hernia.  FAMILY HISTORY:  Diabetes mellitus, hypertension, stroke, and coronary artery disease.  SOCIAL HISTORY:  Used to smoke in the past, quit 30 years ago.  No history of use of alcohol or illegal drugs.  The patient has been a widow since 2004.  SURGERY:  Coronary stent placement, cholecystectomy, abdominal hysterectomy, tubal ligation, ovarian cyst removal, coronary stent placement x3 by Dr. Excell Seltzer, coronary artery bypass graft in 2011, cardiac catheterization in 2011, and bilateral cataract removal.  ALLERGIES:  CIPRO, CODEINE, LOVASTATIN, METFORMIN, PENICILLIN, SHELLFISH, SULFA DRUGS, and DILTIAZEM HCL.  REVIEW OF SYSTEMS:  That of history of present illness, some evidence of shortness of breath.  No change in both coronary, urinary or  bowel symptoms.  PHYSICAL EXAMINATION:  GENERAL:  Alert and oriented, in no acute distress. VITAL SIGNS:  Temperature is 98, pulse 58, respiration 18, blood pressure 161/51, O2 saturation 99%. HEAD:  Normocephalic. EYES:  Conjunctivae are clear. CHEST:  Anterior chest wall scar. ABDOMEN:  Soft, active bowel sounds. CARDIAC:  S1-S2 regular. EXTREMITIES:  Left shoulder tender anterior and laterally with increased pain on abduction above 75 degrees and external rotation, increased pain also on resisted abduction.  Active range of motion is limited due to pain with apparent blockage at 80 degrees.  Good grip pinch and intrinsics intact.  IMAGING:  X-rays revealed decreased subacromial space with spur formation.  IMPRESSION:  Impingement syndrome possible cuff tear, left shoulder.  PLAN:  Arthroscopy, acromioplasty left shoulder and possible mini open cuff repair.     Myrtie Neither, MD     AC/MEDQ  D:  06/16/2011  T:  06/16/2011  Job:  [id number]

## 2011-06-16 NOTE — Op Note (Signed)
NAMENATHA, GUIN NO.:  [id number]  MEDICAL RECORD NO.:  [id number]  LOCATION:  5035                         FACILITY:  [facility]  PHYSICIAN:  Myrtie Neither, MD      DATE OF BIRTH:  1933-02-11  DATE OF PROCEDURE:  06/16/2011 DATE OF DISCHARGE:                              OPERATIVE REPORT   PREOPERATIVE DIAGNOSIS:  Impingement syndrome, left shoulder.  Possible cuff tear, left shoulder.  ANESTHESIA:  General.  PROCEDURE:  Arthroscopic acromioplasty, arthroscopic synovectomy of left shoulder.  POSTOPERATIVE DIAGNOSIS:  Impingement syndrome with partial rotator cuff tear.  PROCEDURE IN DETAIL:  The patient was taken to the operating room. After given adequate preop medications, given general anesthesia and intubated.  The patient placed in a barber-chair position.  The left shoulder was prepped with DuraPrep and draped in sterile manner.  A 1/2- inch incision was made posterior along the left shoulder, going through the skin, subcutaneous tissue.  Swisher rod was placed from posterior to anterior, and anterior incision was made.  The inflow broad to the anterior and the arthroscope was posteriorly, a separate lateral incision was made laterally for the shaver.  Inspection of the joint reveals tremendously hypertrophic overgrowth with subacromial bursal sac, tendinopathy severe of the internal rotator cuff anterior and posteriorly, but no through and through complete destruction.  With the use of synovial shaver, a complete synovectomy was done.  Followed by acromioplasty with use of the shaver, starting from posterior to anterior.  After the adequate acromioplasty and synovectomy, further inspection of the cuff revealed thinning, but not complete disruption of the rotator cuff.  Wound closure was then done with the use of 4-0 nylon.  Compressive dressing was applied.  Immobilizing sling was applied.  The patient tolerated the procedure quite well and went  to recovery room in stable and satisfactory condition.  The patient is being kept 23-hour observation and will be discharged home on Percocet 1-2 q.4 p.r.n. for pain, ice packs, continue shoulder immobilization, and return to the office in 1 week.  The patient will be discharged in stable condition.     Myrtie Neither, MD     AC/MEDQ  D:  06/16/2011  T:  06/16/2011  Job:  [id number]

## 2011-06-16 NOTE — Progress Notes (Signed)
ANTICOAGULATION CONSULT NOTE - Initial Consult  Pharmacy Consult for warfarin Indication: DVT pxl  Allergies  Allergen Reactions  . Ciprofloxacin Nausea And Vomiting    syncope  . Codeine Other (See Comments)    HALLUCINATIONS  . Diltiazem Hcl     REACTION: low heart rate  . Hydrocodone     MAKES HER PASS OUT  . Lovastatin     REACTION: rhabdo  . Metformin   . Penicillins   . Shellfish Allergy Hives  . Sulfonamide Derivatives     Patient Measurements:   Heparin Dosing Weight:   Vital Signs: Temp: 96.8 F (36 C) (03/07 1040) Temp src: Oral (03/07 0618) BP: 197/65 mmHg (03/07 1039) Pulse Rate: 56  (03/07 1039)  Labs: No results found for this basename: HGB:2,HCT:3,PLT:3,APTT:3,LABPROT:3,INR:3,HEPARINUNFRC:3,CREATININE:3,CKTOTAL:3,CKMB:3,TROPONINI:3 in the last 72 hours The CrCl is unknown because both a height and weight (above a minimum accepted value) are required for this calculation.  Medical History: Past Medical History  Diagnosis Date  . CAD (coronary artery disease)     Multivessel s/p PCI w/DES 2008 and CABG 2011  . HTN (hypertension)   . Type II or unspecified type diabetes mellitus without mention of complication, not stated as uncontrolled   . PVD (peripheral vascular disease)   . Diverticulosis of colon   . Morbid obesity   . Gout   . Depression   . Osteoarthritis   . Osteopenia   . Hyperlipidemia   . LBP (low back pain)     Lumbar disc disease/lumbar spinal stenosis  . Disc disease, degenerative, cervical   . History of thrombocytopenia   . Anxiety   . GERD (gastroesophageal reflux disease)   . Barrett esophagus   . Gastroparesis   . Helicobacter pylori gastritis   . Allergic rhinitis   . Anemia   . Myocardial infarction   . Hypothyroidism   . H/O hiatal hernia     Medications:  Prescriptions prior to admission  Medication Sig Dispense Refill  . allopurinol (ZYLOPRIM) 100 MG tablet Take 100 mg by mouth daily.      Marland Kitchen aspirin 81 MG  chewable tablet Chew 81 mg by mouth daily.        Marland Kitchen atorvastatin (LIPITOR) 20 MG tablet Take 20 mg by mouth daily.      . clopidogrel (PLAVIX) 75 MG tablet Take 75 mg by mouth daily.      . furosemide (LASIX) 80 MG tablet Take 80 mg by mouth every morning.        . Ibuprofen-Famotidine (DUEXIS) 800-26.6 MG TABS Take 1 tablet by mouth 2 (two) times daily.      . insulin NPH (HUMULIN N) 100 UNIT/ML injection Inject 20-70 Units into the skin 2 (two) times daily. 70 UNITS IN AM AND 20 UNIT IN PM      . iron polysaccharides (NIFEREX) 150 MG capsule Take 150 mg by mouth daily.        Marland Kitchen levocetirizine (XYZAL) 5 MG tablet Take 5 mg by mouth daily as needed. For allergies       . levothyroxine (SYNTHROID, LEVOTHROID) 100 MCG tablet TAKE ONE TABLET BY MOUTH EVERY DAY  30 tablet  1  . losartan (COZAAR) 100 MG tablet Take 100 mg by mouth daily.        Marland Kitchen omeprazole (PRILOSEC) 20 MG capsule Take 20 mg by mouth 2 (two) times daily.       . potassium chloride SA (K-DUR,KLOR-CON) 20 MEQ tablet Take 20 mEq by mouth 2 (  two) times daily.        Marland Kitchen PRESCRIPTION MEDICATION Take 2 tablets by mouth daily. Pt. On a study medication through [facility] cardiology      . Saxagliptin-Metformin (KOMBIGLYZE XR) 08-998 MG TB24 Take 1 tablet by mouth daily.        Marland Kitchen allopurinol (ZYLOPRIM) 100 MG tablet       . nitroGLYCERIN (NITROSTAT) 0.4 MG SL tablet Place 0.4 mg under the tongue as directed.          Assessment: 78 yo F to start warfarin for VTE pxl s/p L shoulder arthroscopy.  RN and phlebotomy attempted to draw baseline INR but unable to stick patient.  Previous records reveal normal INR as of 11/29/10 = 0.99.  Per Medication history, patient does not take anticoagulants at home.  Will cancel today's order for an INR.  Goal of Therapy:  INR 2-3   Plan:  Warfarin 5 mg po x1 Daily INR Warfarin book/video to patient  Jill Side L. Illene Bolus, PharmD, BCPS Clinical Pharmacist Pager: [phone] 06/16/2011 2:19 PM

## 2011-06-16 NOTE — Progress Notes (Signed)
Dr. Ivin Booty aware abt . SBP in the 190's, ok to go to the floor.

## 2011-06-16 NOTE — Preoperative (Signed)
Beta Blockers   Reason not to administer Beta Blockers:Not Applicable 

## 2011-06-16 NOTE — Progress Notes (Signed)
Notified dr. Katrinka Blazing of pt's bloodsugar 77. Pt. Denies any symptoms of hypoglycemia. No new orders

## 2011-06-16 NOTE — Anesthesia Procedure Notes (Signed)
Anesthesia Regional Block:  Interscalene brachial plexus block  Pre-Anesthetic Checklist: ,, timeout performed, Correct Patient, Correct Site, Correct Laterality, Correct Procedure, Correct Position, site marked, Risks and benefits discussed,  Surgical consent,  Pre-op evaluation,  At surgeon's request and post-op pain management  Laterality: Left and Upper  Prep: chloraprep       Needles:  Injection technique: Single-shot  Needle Type: Echogenic Needle     Needle Length: 5cm 5 cm Needle Gauge: 22 and 22 G    Additional Needles:  Procedures: ultrasound guided Interscalene brachial plexus block Narrative:  Start time: 06/16/2011 7:32 AM End time: 06/16/2011 7:42 AM Injection made incrementally with aspirations every 5 mL.  Performed by: Personally  Anesthesiologist: Sheldon Silvan  Supraclavicular block

## 2011-06-16 NOTE — Anesthesia Postprocedure Evaluation (Signed)
  Anesthesia Post-op Note  Patient: [name]  Procedure(s) Performed: Procedure(s) (LRB): ARTHROSCOPY SHOULDER (Left)  Patient Location: PACU  Anesthesia Type: GA combined with regional for post-op pain  Level of Consciousness: awake, alert  and oriented  Airway and Oxygen Therapy: Patient Spontanous Breathing and Patient connected to nasal cannula oxygen  Post-op Pain: mild  Post-op Assessment: Post-op Vital signs reviewed, Patient's Cardiovascular Status Stable, Respiratory Function Stable, Patent Airway, No signs of Nausea or vomiting and Pain level controlled  Post-op Vital Signs: Reviewed and stable  Complications: No apparent anesthesia complications

## 2011-06-16 NOTE — Transfer of Care (Signed)
Immediate Anesthesia Transfer of Care Note  Patient: [name]  Procedure(s) Performed: Procedure(s) (LRB): ARTHROSCOPY SHOULDER (Left)  Patient Location: PACU  Anesthesia Type: General  Level of Consciousness: awake, alert , oriented and sedated  Airway & Oxygen Therapy: Patient Spontanous Breathing and Patient connected to nasal cannula oxygen  Post-op Assessment: Report given to PACU RN, Post -op Vital signs reviewed and stable and Patient moving all extremities  Post vital signs: Reviewed stable  Complications: No apparent anesthesia complications

## 2011-06-16 NOTE — Brief Op Note (Signed)
06/16/2011  9:29 AM  PATIENT:  [name]  78 y.o. female  PRE-OPERATIVE DIAGNOSIS:  impingement syndrum cuff tear left shoulder   POST-OPERATIVE DIAGNOSIS:  impingement syndrum cuff tear left shoulder   PROCEDURE:  Procedure(s) (LRB): ARTHROSCOPY SHOULDER (Left)  SURGEON:  Surgeon(s) and Role:    * Kennieth Rad, MD - Primary  PHYSICIAN ASSISTANT:   ASSISTANTS: none   ANESTHESIA:   general  EBL:  Total I/O In: 1000 [I.V.:1000] Out: -   BLOOD ADMINISTERED:none  DRAINS: none   LOCAL MEDICATIONS USED:  NONE  SPECIMEN:  No Specimen  DISPOSITION OF SPECIMEN:  N/A  COUNTS:  YES  TOURNIQUET:  * No tourniquets in log *  DICTATION: .Other Dictation: Dictation Number [id number]  PLAN OF CARE: Admit to inpatient   PATIENT DISPOSITION:  PACU - hemodynamically stable.   Delay start of Pharmacological VTE agent (>24hrs) due to surgical blood loss or risk of bleeding: yes

## 2011-06-16 NOTE — Telephone Encounter (Signed)
Records faxed to [facility]

## 2011-06-16 NOTE — Addendum Note (Signed)
Addendum  created 06/16/11 1037 by Kerby Nora, MD   Modules edited:Orders

## 2011-06-17 LAB — GLUCOSE, CAPILLARY: Glucose-Capillary: 155 mg/dL — ABNORMAL HIGH (ref 70–99)

## 2011-06-17 LAB — PROTIME-INR
INR: 1.11 (ref 0.00–1.49)
Prothrombin Time: 14.5 seconds (ref 11.6–15.2)

## 2011-06-17 MED ORDER — WARFARIN SODIUM 1 MG PO TABS: 1.0000 mg | ORAL_TABLET | Freq: Every day | ORAL | Status: DC

## 2011-06-17 MED ORDER — OXYCODONE-ACETAMINOPHEN 7.5-325 MG PO TABS: 1.0000 | ORAL_TABLET | ORAL | Status: AC | PRN

## 2011-06-17 NOTE — Progress Notes (Addendum)
Pt is s/p L shoulder surgery. Pt is alert and oriented x 3. Neuro check is negative. Pt has a dry gauze dressing and ice to L shoulder.  Sling to LUE. +cms. Pt has 1+ nonpitting edema of L hand. Pt instructed to elevate LUE to prevent/minimize edema. Pt verb understanding and agrees to comply. Pt ambulates with one assist. Pt voids quantity sufficieint without difficulty. No s/sx resp or cardiac distress and no c/o such. Pt performs IS per order. Heart rate regular rate and rhythm. Lungs CTA. Pt repts LBM this AM. Pt abdomen is soft flat nontender and nondistended. BS+x4. Pt repts passing gas and denies nausea or vomiting. Pt tolerates diet fair to good. Pt denies pain.

## 2011-06-17 NOTE — Discharge Summary (Signed)
NAMEDONNICE, NIELSEN NO.:  [id number]  MEDICAL RECORD NO.:  [id number]  LOCATION:  5035                         FACILITY:  [facility]  PHYSICIAN:  Myrtie Neither, MD      DATE OF BIRTH:  1932-07-05  DATE OF ADMISSION:  06/16/2011 DATE OF DISCHARGE:  06/17/2011                              DISCHARGE SUMMARY   ADMITTING DIAGNOSES:  Impingement syndrome, left shoulder.  Possible rotator cuff tear.  DISCHARGE DIAGNOSES:  Impingement syndrome, left shoulder.  Partial tear of left rotator cuff.  OPERATIONS:  Arthroscopic acromioplasty, synovectomy, left shoulder.  COMPLICATIONS:  None.  INFECTIONS:  None.  PERTINENT HISTORY:  This is a 78 year old female who is being followed in the office for impingement syndrome with possible cuff tear, chronic, to the left shoulder.  The patient had been treated with anti- inflammatories, use of steroid, moist-heat shower, exercises with progressive worsening of symptoms over the past few weeks.  The patient had increased loss of function of range of motion as well as persistent and increased pain.  PERTINENT PHYSICAL EXAM:  The left shoulder is tender anterior and laterally, limited range of motion with increased pain on attempted right abduction at 75 degrees and rotation, increased pain on resisted abduction.  Good grip and pinch, and intrinsics intact.  X-rays revealed loss of subacromial space with sclerosis and spur formation.  HOSPITAL COURSE:  The patient underwent preop laboratory, CBC, EKG, chest x-ray, UA, CMET.  The patient's labs were stable enough to undergo surgery.  The patient underwent arthroscopic acromioplasty and synovectomy of the left shoulder.  Postop course was that of pain control, ice packs, immobilizing sling, use of PCA with PA and Dilaudid for pain control.  The patient's pain is under control, afebrile, good grip in the left hand.  Dressings are dry.  Patient may be discharged on Percocet 1-2  q.4 p.r.n. for pain.  Ice packs for 48 hours.  Return to the office in 1 week.  The patient is being discharged in stable and satisfactory condition.     Myrtie Neither, MD     AC/MEDQ  D:  06/17/2011  T:  06/17/2011  Job:  [id number]

## 2011-06-17 NOTE — Progress Notes (Signed)
Pt to be d/c on Coumadin. MD called. MD doesn't wish to home home health RN, PT/OT at this time.

## 2011-06-27 ENCOUNTER — Encounter (HOSPITAL_COMMUNITY): Payer: Self-pay | Admitting: Orthopedic Surgery

## 2011-07-05 ENCOUNTER — Encounter: Payer: Self-pay | Admitting: Cardiovascular Disease

## 2011-07-05 ENCOUNTER — Ambulatory Visit (INDEPENDENT_AMBULATORY_CARE_PROVIDER_SITE_OTHER): Payer: Medicare Other | Admitting: Cardiovascular Disease

## 2011-07-05 VITALS — BP 144/76 | HR 62 | Ht 67.0 in | Wt 227.0 lb

## 2011-07-05 DIAGNOSIS — I10 Essential (primary) hypertension: Secondary | ICD-10-CM

## 2011-07-05 DIAGNOSIS — I2510 Atherosclerotic heart disease of native coronary artery without angina pectoris: Secondary | ICD-10-CM

## 2011-07-05 DIAGNOSIS — E7800 Pure hypercholesterolemia, unspecified: Secondary | ICD-10-CM

## 2011-07-05 MED ORDER — PRAVASTATIN SODIUM 40 MG PO TABS: 40.0000 mg | ORAL_TABLET | Freq: Every evening | ORAL | Status: DC

## 2011-07-05 NOTE — Progress Notes (Signed)
HPI:  78 year old woman today for followup evaluation. The patient has diabetes and multivessel coronary disease. She underwent multivessel PCI in 2009. She returned in 2011 with unstable angina and ultimately required multivessel coronary bypass surgery. The patient has been intolerant to beta blockers because of marked bradycardia and syncope.  Recent labs were reviewed and potassium was 4.0, sodium 140, creatinine 1.1, white blood cells mildly elevated at 11.9, hematocrit was 35.7, platelets 143,000. Most recent hemoglobin A1c was 7.4. Chest x-ray from March 1 showed no active disease.  The patient had left shoulder surgery about 3 weeks ago after a fall. She has done well. She tells me that she was sent home on Coumadin but she has not taken it. She wonders whether she needs to take it at this point. She continues on aspirin and Plavix. She has stable dyspnea with exertion. This is unchanged over time. The patient has chronic orthopnea. She does not have PND or lower extremity swelling. She denies chest pain or pressure. She hasn't been taking pravastatin because she thought it shouldn't be taken with Plavix.  Outpatient Encounter Prescriptions as of 07/05/2011  Medication Sig Dispense Refill  . allopurinol (ZYLOPRIM) 100 MG tablet Take 100 mg by mouth daily.      Marland Kitchen aspirin 81 MG chewable tablet Chew 81 mg by mouth daily.        . clopidogrel (PLAVIX) 75 MG tablet Take 75 mg by mouth daily.      . furosemide (LASIX) 80 MG tablet Take 80 mg by mouth every morning.        . Ibuprofen-Famotidine (DUEXIS) 800-26.6 MG TABS Take 1 tablet by mouth 2 (two) times daily.      . insulin NPH (HUMULIN N) 100 UNIT/ML injection Inject 20-70 Units into the skin 2 (two) times daily. 70 UNITS IN AM AND 20 UNIT IN PM      . iron polysaccharides (NIFEREX) 150 MG capsule Take 150 mg by mouth daily.        Marland Kitchen levocetirizine (XYZAL) 5 MG tablet Take 5 mg by mouth daily as needed. For allergies       . levothyroxine  (SYNTHROID, LEVOTHROID) 100 MCG tablet TAKE ONE TABLET BY MOUTH EVERY DAY  30 tablet  1  . losartan (COZAAR) 100 MG tablet Take 100 mg by mouth daily.        . nitroGLYCERIN (NITROSTAT) 0.4 MG SL tablet Place 0.4 mg under the tongue as directed.        Marland Kitchen omeprazole (PRILOSEC) 20 MG capsule Take 20 mg by mouth 2 (two) times daily.       . potassium chloride SA (K-DUR,KLOR-CON) 20 MEQ tablet Take 20 mEq by mouth 2 (two) times daily.        Marland Kitchen PRESCRIPTION MEDICATION Take 2 tablets by mouth daily. Pt. On a study medication through [facility] cardiology      . Saxagliptin-Metformin (KOMBIGLYZE XR) 08-998 MG TB24 Take 1 tablet by mouth daily.        Marland Kitchen DISCONTD: allopurinol (ZYLOPRIM) 100 MG tablet       . DISCONTD: atorvastatin (LIPITOR) 20 MG tablet Take 20 mg by mouth daily.      Marland Kitchen DISCONTD: diclofenac (VOLTAREN) 50 MG EC tablet As needed      . DISCONTD: etodolac (LODINE XL) 400 MG 24 hr tablet 1 tab  daily      . DISCONTD: warfarin (COUMADIN) 1 MG tablet Take 1 tablet (1 mg total) by mouth daily.  30 tablet  0    Allergies  Allergen Reactions  . Ciprofloxacin Nausea And Vomiting    syncope  . Codeine Other (See Comments)    HALLUCINATIONS  . Diltiazem Hcl     REACTION: low heart rate  . Hydrocodone     MAKES HER PASS OUT  . Lovastatin     REACTION: rhabdo  . Metformin   . Penicillins   . Shellfish Allergy Hives  . Sulfonamide Derivatives     Past Medical History  Diagnosis Date  . CAD (coronary artery disease)     Multivessel s/p PCI w/DES 2008 and CABG 2011  . HTN (hypertension)   . Type II or unspecified type diabetes mellitus without mention of complication, not stated as uncontrolled   . PVD (peripheral vascular disease)   . Diverticulosis of colon   . Morbid obesity   . Gout   . Depression   . Osteoarthritis   . Osteopenia   . Hyperlipidemia   . LBP (low back pain)     Lumbar disc disease/lumbar spinal stenosis  . Disc disease, degenerative, cervical   . History of  thrombocytopenia   . Anxiety   . GERD (gastroesophageal reflux disease)   . Barrett esophagus   . Gastroparesis   . Helicobacter pylori gastritis   . Allergic rhinitis   . Anemia   . Myocardial infarction   . Hypothyroidism   . H/O hiatal hernia     ROS: Negative except as per HPI  Ht 5\' 7"  (1.702 m)  Wt 102.967 kg (227 lb)  BMI 35.55 kg/m2  PHYSICAL EXAM: Pt is alert and oriented, pleasant, overweight woman in NAD HEENT: normal Neck: JVP - normal, carotids 2+= with soft bilateral bruits Lungs: CTA bilaterally CV: RRR without murmur or gallop Abd: soft, NT, Positive BS, no hepatomegaly Ext: no C/C/E, distal pulses intact and equal Skin: warm/dry no rash  EKG:  Sinus rhythm 63 beats per minute, nonspecific ST abnormality, otherwise within normal limits.  ASSESSMENT AND PLAN:

## 2011-07-05 NOTE — Assessment & Plan Note (Signed)
The patient is stable without anginal symptoms. Her EKG is unremarkable. She is tolerating medical therapy including long-term dual antiplatelet therapy after multiple stents. The patient is status post aortocoronary bypass within the past 5 years and she has no ischemic symptoms. She does need to be on lipid-lowering medication and I have taken the liberty of starting her back on pravastatin 40 mg daily. Will check lipids and LFTs in 3 months.

## 2011-07-05 NOTE — Assessment & Plan Note (Signed)
Borderline control. Lifestyle modification reviewed. Patient continues on losartan and furosemide. She is unable to take beta blockers. There is an allergy to diltiazem. Could consider amlodipine in the future but we would have to review if she has had intolerance to this in the past. For now will continue same medications.

## 2011-07-05 NOTE — Patient Instructions (Signed)
Your physician wants you to follow-up in: 6 months.  You will receive a reminder letter in the mail two months in advance. If you don't receive a letter, please call our office to schedule the follow-up appointment.  Your physician has recommended you make the following change in your medication: Resume pravastatin 40 mg by mouth daily.  Your physician recommends that you return for fasting lab work in: 3 months--last week of June--Lipid and liver profile and BMP

## 2011-07-18 ENCOUNTER — Ambulatory Visit (INDEPENDENT_AMBULATORY_CARE_PROVIDER_SITE_OTHER): Payer: Medicare Other | Admitting: Endocrinology

## 2011-07-18 DIAGNOSIS — Z0289 Encounter for other administrative examinations: Secondary | ICD-10-CM

## 2011-07-18 DIAGNOSIS — E119 Type 2 diabetes mellitus without complications: Secondary | ICD-10-CM

## 2011-07-19 ENCOUNTER — Ambulatory Visit: Payer: Medicare Other | Attending: Orthopedic Surgery | Admitting: Physical Therapy

## 2011-07-19 DIAGNOSIS — M25519 Pain in unspecified shoulder: Secondary | ICD-10-CM | POA: Insufficient documentation

## 2011-07-19 DIAGNOSIS — M25619 Stiffness of unspecified shoulder, not elsewhere classified: Secondary | ICD-10-CM | POA: Insufficient documentation

## 2011-07-19 DIAGNOSIS — IMO0001 Reserved for inherently not codable concepts without codable children: Secondary | ICD-10-CM | POA: Insufficient documentation

## 2011-07-20 NOTE — Progress Notes (Signed)
  Subjective:    Patient ID: [name], female    DOB: 03/16/1933, 78 y.o.   MRN: [id number]  HPI    Review of Systems     Objective:   Physical Exam        Assessment & Plan:

## 2011-07-26 ENCOUNTER — Ambulatory Visit: Payer: Medicare Other | Admitting: Physical Therapy

## 2011-07-28 ENCOUNTER — Ambulatory Visit: Payer: Medicare Other | Admitting: Rehabilitation

## 2011-08-01 ENCOUNTER — Ambulatory Visit: Payer: Medicare Other | Admitting: Physical Therapy

## 2011-08-04 ENCOUNTER — Ambulatory Visit: Payer: Medicare Other | Admitting: Physical Therapy

## 2011-08-05 ENCOUNTER — Other Ambulatory Visit: Payer: Self-pay | Admitting: Physician Assistant

## 2011-08-05 ENCOUNTER — Other Ambulatory Visit: Payer: Self-pay | Admitting: Endocrinology

## 2011-08-08 ENCOUNTER — Ambulatory Visit: Payer: Medicare Other | Admitting: Physical Therapy

## 2011-08-09 ENCOUNTER — Ambulatory Visit: Payer: Medicare Other | Admitting: Physical Therapy

## 2011-08-16 ENCOUNTER — Ambulatory Visit: Payer: Medicare Other | Admitting: Physical Therapy

## 2011-09-01 ENCOUNTER — Ambulatory Visit: Payer: Medicare Other | Attending: Orthopedic Surgery | Admitting: Physical Therapy

## 2011-09-01 DIAGNOSIS — M25619 Stiffness of unspecified shoulder, not elsewhere classified: Secondary | ICD-10-CM | POA: Insufficient documentation

## 2011-09-01 DIAGNOSIS — IMO0001 Reserved for inherently not codable concepts without codable children: Secondary | ICD-10-CM | POA: Insufficient documentation

## 2011-09-01 DIAGNOSIS — M25519 Pain in unspecified shoulder: Secondary | ICD-10-CM | POA: Insufficient documentation

## 2011-09-10 ENCOUNTER — Other Ambulatory Visit: Payer: Self-pay | Admitting: Internal Medicine

## 2011-09-25 IMAGING — CR DG CHEST 1V PORT
1 series · 1 of 1 positions shown · non-contrast
Comparison: 02/18/2010 and 02/17/2010.

CLINICAL DATA: Postop CABG.  Unstable angina.

PORTABLE CHEST - 1 VIEW

[view not recorded]
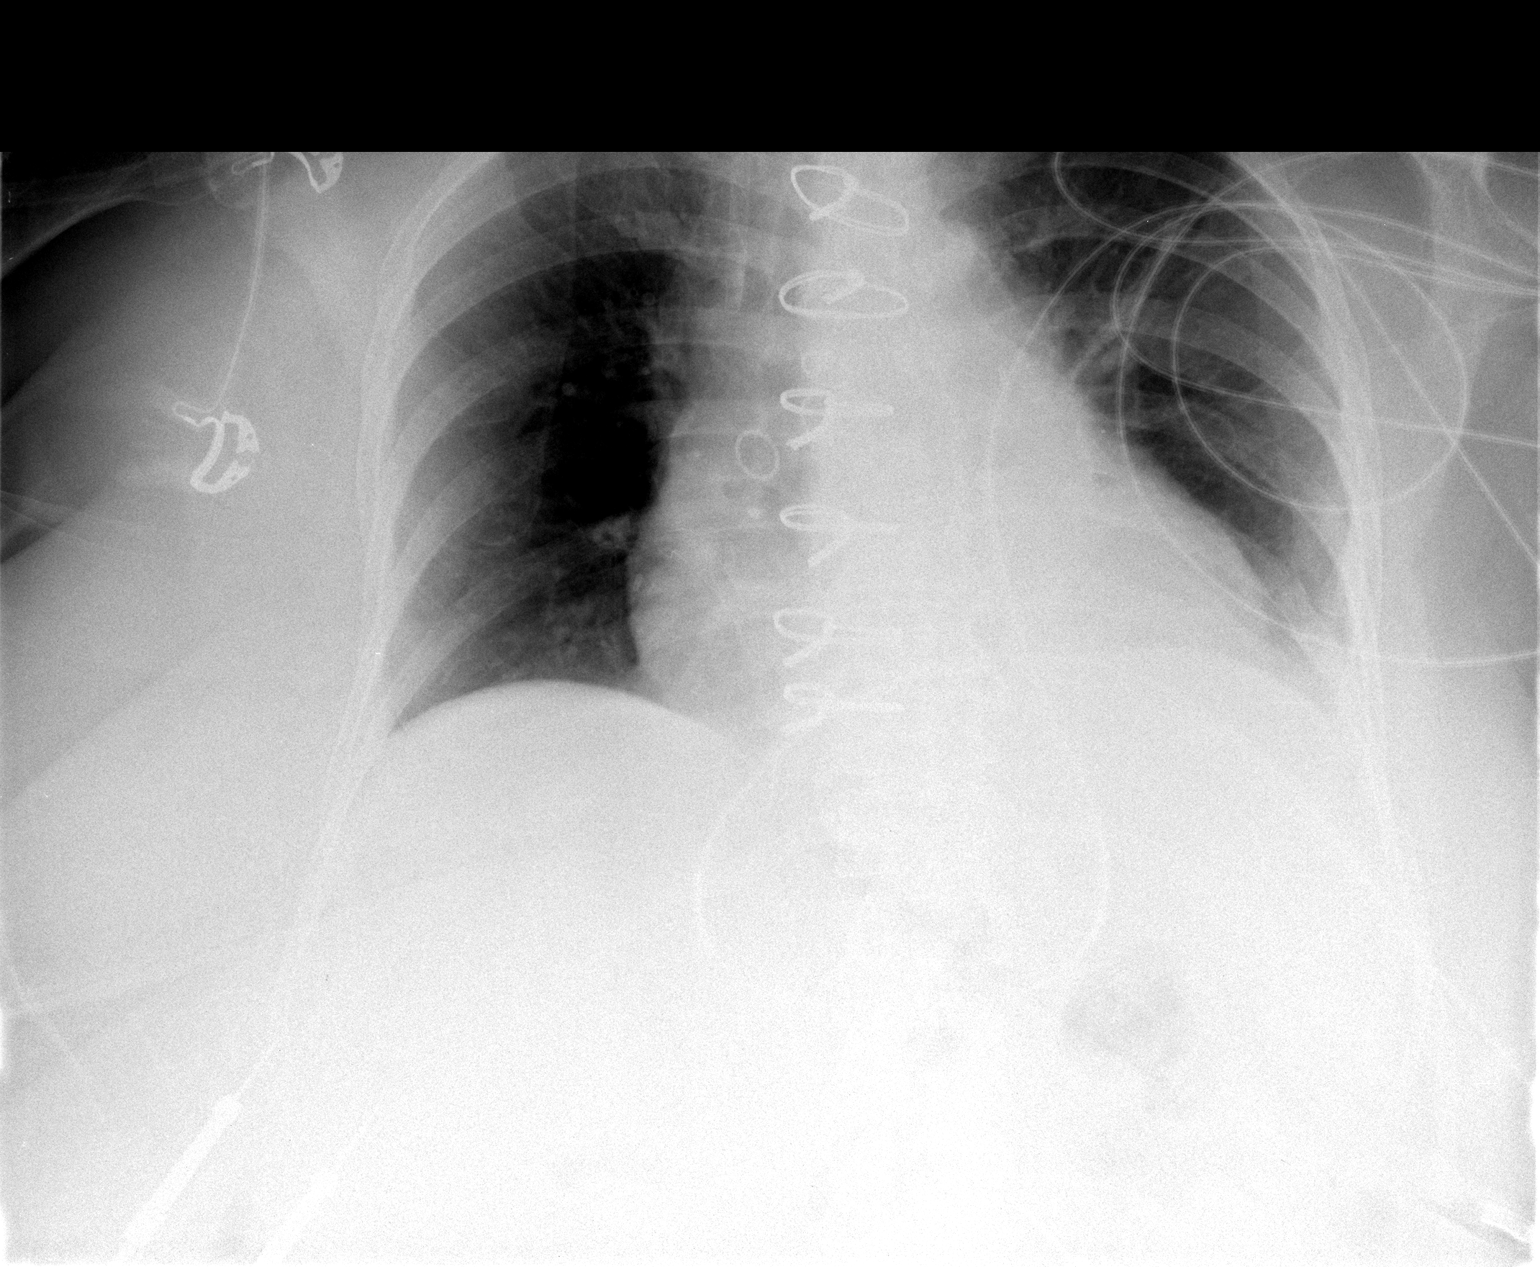

[1 of 1 positions shown; findings below may reference images not displayed]

FINDINGS: 4264 hours.  There has been interval removal of the
mediastinal drain, left chest tube and Swan-Ganz catheter.  Right
IJ sheath remains in the brachiocephalic vein.  There is improved
aeration of the lung bases with mild residual left lower lobe
atelectasis.  There is a small left pleural effusion and a probable
tiny left apical pneumothorax.  The right lung appears clear.
Heart size and mediastinal contours are stable.
IMPRESSION: Tiny left apical pneumothorax following chest tube removal.
Improved pulmonary aeration.

## 2011-09-28 IMAGING — CR DG CHEST 1V PORT
1 series · 1 of 1 positions shown · non-contrast
Comparison: 02/19/2010

CLINICAL DATA: CHF.  Unstable angina.

PORTABLE CHEST - 1 VIEW

[AP]
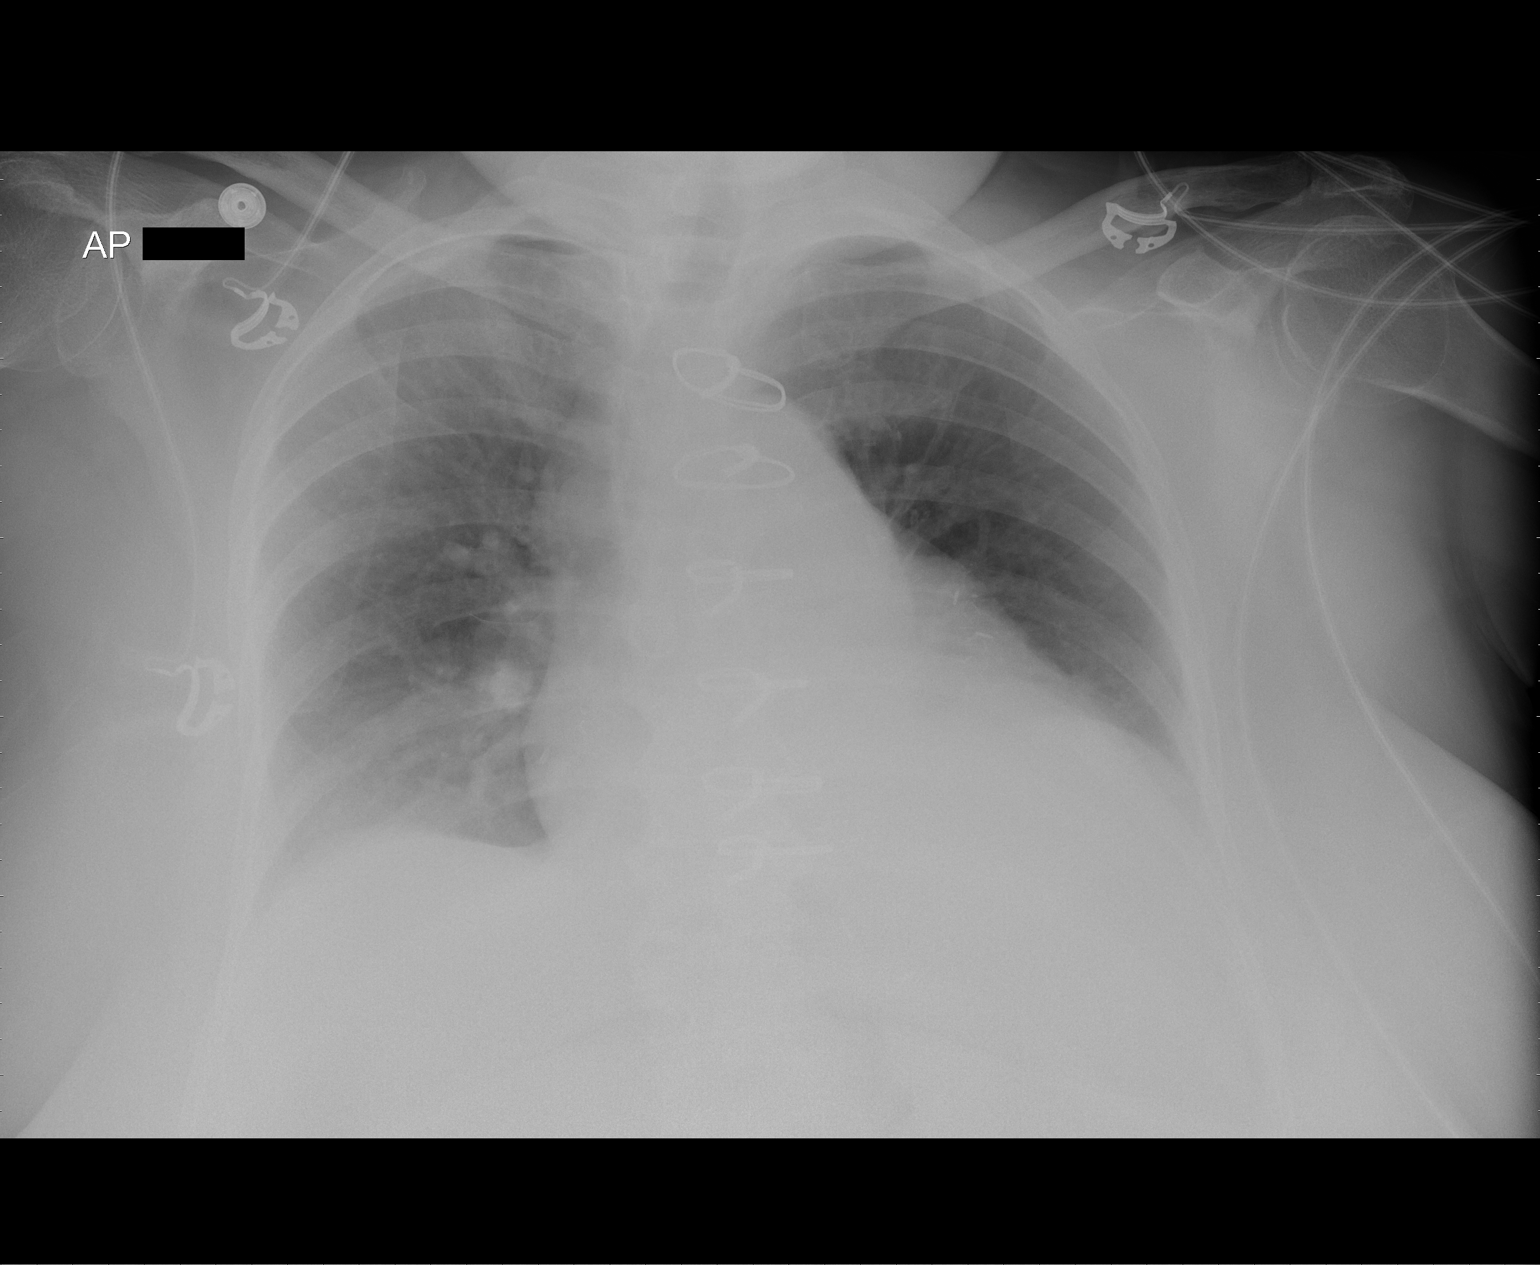

[1 of 1 positions shown; findings below may reference images not displayed]

FINDINGS: Patient minimally rotated to the left. Prior median
sternotomy. Cardiomegaly accentuated by AP portable technique.
Removal of right IJ Cordis sheath.  Exam mildly degraded by patient
size and AP portable technique.  No definite pleural fluid.  The
previously described left apical pneumothorax has resolved.  Mild
pulmonary venous congestion is asymmetric, greater right than left.
Bibasilar airspace disease.
IMPRESSION: 1.  Cardiomegaly and mild asymmetric pulmonary venous congestion.
2.  Bibasilar airspace disease, likely atelectasis.
3.  Low lung volumes.
4.  Resolution of previously described left apical pneumothorax.

## 2011-10-04 ENCOUNTER — Other Ambulatory Visit (INDEPENDENT_AMBULATORY_CARE_PROVIDER_SITE_OTHER): Payer: Medicare Other

## 2011-10-04 DIAGNOSIS — E7800 Pure hypercholesterolemia, unspecified: Secondary | ICD-10-CM

## 2011-10-04 DIAGNOSIS — I2510 Atherosclerotic heart disease of native coronary artery without angina pectoris: Secondary | ICD-10-CM

## 2011-10-04 LAB — BASIC METABOLIC PANEL
GFR: 61.31 mL/min (ref >60.00)
Glucose, Bld: 80 mg/dL (ref 70–99)
Potassium: 4.2 mEq/L (ref 3.5–5.1)
Sodium: 142 mEq/L (ref 135–145)

## 2011-10-04 LAB — HEPATIC FUNCTION PANEL
ALT: 16 U/L (ref 0–35)
Total Bilirubin: 0.5 mg/dL (ref 0.3–1.2)
Total Protein: 6.6 g/dL (ref 6.0–8.3)

## 2011-10-04 LAB — LIPID PANEL
HDL: 87.7 mg/dL (ref >39.00)
VLDL: 13.8 mg/dL (ref 0.0–40.0)

## 2011-10-07 ENCOUNTER — Other Ambulatory Visit: Payer: Self-pay | Admitting: Endocrinology

## 2011-10-20 ENCOUNTER — Other Ambulatory Visit: Payer: Self-pay | Admitting: Internal Medicine

## 2011-12-13 ENCOUNTER — Encounter: Payer: Self-pay | Admitting: Cardiovascular Disease

## 2011-12-13 ENCOUNTER — Ambulatory Visit (INDEPENDENT_AMBULATORY_CARE_PROVIDER_SITE_OTHER): Payer: Medicare Other | Admitting: Cardiovascular Disease

## 2011-12-13 VITALS — BP 130/80 | HR 57 | Ht 67.0 in | Wt 216.1 lb

## 2011-12-13 DIAGNOSIS — I10 Essential (primary) hypertension: Secondary | ICD-10-CM

## 2011-12-13 DIAGNOSIS — E785 Hyperlipidemia, unspecified: Secondary | ICD-10-CM

## 2011-12-13 DIAGNOSIS — I2510 Atherosclerotic heart disease of native coronary artery without angina pectoris: Secondary | ICD-10-CM

## 2011-12-13 NOTE — Assessment & Plan Note (Addendum)
Stable without anginal symptoms. Continue dual antiplatelet therapy with aspirin and Plavix. Continue secondary risk reduction measures as outlined below. Her chest pain seems related to postoperative changes. She also complains of itching around the scar. She's going to try hydrocortisone cream to

## 2011-12-13 NOTE — Progress Notes (Signed)
HPI:  78 year old woman presenting for followup evaluation. She has coronary artery disease status post multivessel PCI ultimately requiring coronary bypass surgery.  From a symptomatic perspective, the patient is stable. She reports dyspnea with exertion. This is long-standing and unchanged over time. She's had no resting dyspnea, orthopnea, PND, or edema. She's had no chest pain or pressure. She complains of itching around her sternotomy scar. She's had some pain in the upper part of the chest but this is relieved when she places her hand over her chest and pushes on the chest wall. She has no exertional chest pain or tightness.  Outpatient Encounter Prescriptions as of 12/13/2011  Medication Sig Dispense Refill  . allopurinol (ZYLOPRIM) 100 MG tablet Take 100 mg by mouth daily.      Marland Kitchen aspirin 81 MG chewable tablet Chew 81 mg by mouth daily.        . clopidogrel (PLAVIX) 75 MG tablet Take 75 mg by mouth daily.      . diclofenac (VOLTAREN) 50 MG EC tablet TAKE ONE  BY MOUTH TWICE DAILY AS NEEDED FOR PAIN  60 tablet  0  . furosemide (LASIX) 80 MG tablet Take 80 mg by mouth every morning.        . insulin NPH (HUMULIN N) 100 UNIT/ML injection Inject 20-70 Units into the skin 2 (two) times daily. 70 UNITS IN AM AND 20 UNIT IN PM      . insulin NPH (NOVOLIN N RELION) 100 UNIT/ML injection Inject subcutaneously 90 units in the morning and 10 units in the evening  30 mL  3  . iron polysaccharides (NIFEREX) 150 MG capsule Take 150 mg by mouth daily.        Marland Kitchen levocetirizine (XYZAL) 5 MG tablet Take 5 mg by mouth daily as needed. For allergies       . levothyroxine (SYNTHROID, LEVOTHROID) 100 MCG tablet TAKE ONE TABLET BY MOUTH EVERY DAY  30 tablet  5  . losartan (COZAAR) 100 MG tablet TAKE ONE TABLET BY MOUTH EVERY DAY  30 tablet  6  . nitroGLYCERIN (NITROSTAT) 0.4 MG SL tablet Place 0.4 mg under the tongue as directed.        Marland Kitchen omeprazole (PRILOSEC) 20 MG capsule Take 20 mg by mouth 2 (two) times  daily.       . potassium chloride SA (K-DUR,KLOR-CON) 20 MEQ tablet Take 20 mEq by mouth 2 (two) times daily.        . pravastatin (PRAVACHOL) 40 MG tablet Take 1 tablet (40 mg total) by mouth every evening.  90 tablet  3  . PRESCRIPTION MEDICATION Take 2 tablets by mouth daily. Pt. On a study medication through [facility] cardiology      . Saxagliptin-Metformin (KOMBIGLYZE XR) 08-998 MG TB24 Take 1 tablet by mouth daily.        Marland Kitchen DISCONTD: Ibuprofen-Famotidine (DUEXIS) 800-26.6 MG TABS Take 1 tablet by mouth 2 (two) times daily.        Allergies  Allergen Reactions  . Ciprofloxacin Nausea And Vomiting    syncope  . Codeine Other (See Comments)    HALLUCINATIONS  . Diltiazem Hcl     REACTION: low heart rate  . Hydrocodone     MAKES HER PASS OUT  . Lovastatin     REACTION: rhabdo  . Metformin   . Penicillins   . Shellfish Allergy Hives  . Sulfonamide Derivatives     Past Medical History  Diagnosis Date  . CAD (coronary artery disease)  Multivessel s/p PCI w/DES 2008 and CABG 2011  . HTN (hypertension)   . Type II or unspecified type diabetes mellitus without mention of complication, not stated as uncontrolled   . PVD (peripheral vascular disease)   . Diverticulosis of colon   . Morbid obesity   . Gout   . Depression   . Osteoarthritis   . Osteopenia   . Hyperlipidemia   . LBP (low back pain)     Lumbar disc disease/lumbar spinal stenosis  . Disc disease, degenerative, cervical   . History of thrombocytopenia   . Anxiety   . GERD (gastroesophageal reflux disease)   . Barrett esophagus   . Gastroparesis   . Helicobacter pylori gastritis   . Allergic rhinitis   . Anemia   . Myocardial infarction   . Hypothyroidism   . H/O hiatal hernia     ROS: Negative except as per HPI  BP 130/80  Pulse 57  Ht 5\' 7"  (1.702 m)  Wt 98.031 kg (216 lb 1.9 oz)  BMI 33.85 kg/m2  PHYSICAL EXAM: Pt is alert and oriented, pleasant obese woman in NAD HEENT: normal Neck: JVP -  normal, carotids 2+= without bruits Lungs: CTA bilaterally CV: RRR without murmur or gallop Abd: soft, NT, Positive BS, no hepatomegaly Ext: no C/C/E, distal pulses intact and equal Skin: warm/dry no rash  EKG:  Sinus bradycardia 57 beats per minute, age-indeterminate anterior infarction   ASSESSMENT AND PLAN:

## 2011-12-13 NOTE — Patient Instructions (Addendum)
Your physician wants you to follow-up in: 6 months with Dr. Copper. You will receive a reminder letter in the mail two months in advance. If you don't receive a letter, please call our office to schedule the follow-up appointment.  Your physician recommends that you continue on your current medications as directed. Please refer to the Current Medication list given to you today.

## 2011-12-13 NOTE — Assessment & Plan Note (Addendum)
Lipids reviewed and they have been at goal. She will continue with pravastatin

## 2011-12-13 NOTE — Assessment & Plan Note (Signed)
Blood pressure well controlled

## 2012-01-31 ENCOUNTER — Other Ambulatory Visit: Payer: Self-pay | Admitting: General Practice

## 2012-01-31 NOTE — Telephone Encounter (Signed)
No need to refill, as i only see pt for DM anyway

## 2012-01-31 NOTE — Telephone Encounter (Signed)
Noted  

## 2012-01-31 NOTE — Telephone Encounter (Signed)
Received refill request for Allopurinol 100mg . This is a historical med and pt last seen on 04/18/11. Ok to refill?

## 2012-03-04 ENCOUNTER — Inpatient Hospital Stay (HOSPITAL_COMMUNITY)
Admission: EM | Admit: 2012-03-04 | Discharge: 2012-03-08 | DRG: 983 | Disposition: A | Discharge status: Home / Self Care | Payer: Medicare Other | Attending: Family Medicine | Admitting: Family Medicine

## 2012-03-04 ENCOUNTER — Emergency Department (HOSPITAL_COMMUNITY): Payer: Medicare Other

## 2012-03-04 ENCOUNTER — Encounter (HOSPITAL_COMMUNITY): Payer: Self-pay | Admitting: Emergency Medicine

## 2012-03-04 DIAGNOSIS — R809 Proteinuria, unspecified: Secondary | ICD-10-CM

## 2012-03-04 DIAGNOSIS — T504X5A Adverse effect of drugs affecting uric acid metabolism, initial encounter: Secondary | ICD-10-CM | POA: Diagnosis present

## 2012-03-04 DIAGNOSIS — R490 Dysphonia: Secondary | ICD-10-CM

## 2012-03-04 DIAGNOSIS — M899 Disorder of bone, unspecified: Secondary | ICD-10-CM

## 2012-03-04 DIAGNOSIS — Z951 Presence of aortocoronary bypass graft: Secondary | ICD-10-CM

## 2012-03-04 DIAGNOSIS — M545 Low back pain, unspecified: Secondary | ICD-10-CM

## 2012-03-04 DIAGNOSIS — I2510 Atherosclerotic heart disease of native coronary artery without angina pectoris: Secondary | ICD-10-CM

## 2012-03-04 DIAGNOSIS — E7800 Pure hypercholesterolemia, unspecified: Secondary | ICD-10-CM

## 2012-03-04 DIAGNOSIS — M1990 Unspecified osteoarthritis, unspecified site: Secondary | ICD-10-CM

## 2012-03-04 DIAGNOSIS — I5031 Acute diastolic (congestive) heart failure: Secondary | ICD-10-CM

## 2012-03-04 DIAGNOSIS — E860 Dehydration: Secondary | ICD-10-CM

## 2012-03-04 DIAGNOSIS — M949 Disorder of cartilage, unspecified: Secondary | ICD-10-CM | POA: Diagnosis present

## 2012-03-04 DIAGNOSIS — N289 Disorder of kidney and ureter, unspecified: Secondary | ICD-10-CM

## 2012-03-04 DIAGNOSIS — L299 Pruritus, unspecified: Secondary | ICD-10-CM

## 2012-03-04 DIAGNOSIS — F329 Major depressive disorder, single episode, unspecified: Secondary | ICD-10-CM | POA: Diagnosis present

## 2012-03-04 DIAGNOSIS — M48061 Spinal stenosis, lumbar region without neurogenic claudication: Secondary | ICD-10-CM

## 2012-03-04 DIAGNOSIS — M5030 Other cervical disc degeneration, unspecified cervical region: Secondary | ICD-10-CM

## 2012-03-04 DIAGNOSIS — M25519 Pain in unspecified shoulder: Secondary | ICD-10-CM

## 2012-03-04 DIAGNOSIS — D649 Anemia, unspecified: Secondary | ICD-10-CM

## 2012-03-04 DIAGNOSIS — M549 Dorsalgia, unspecified: Secondary | ICD-10-CM

## 2012-03-04 DIAGNOSIS — J309 Allergic rhinitis, unspecified: Secondary | ICD-10-CM

## 2012-03-04 DIAGNOSIS — I498 Other specified cardiac arrhythmias: Secondary | ICD-10-CM | POA: Diagnosis present

## 2012-03-04 DIAGNOSIS — K3184 Gastroparesis: Secondary | ICD-10-CM

## 2012-03-04 DIAGNOSIS — Z87891 Personal history of nicotine dependence: Secondary | ICD-10-CM

## 2012-03-04 DIAGNOSIS — I6529 Occlusion and stenosis of unspecified carotid artery: Secondary | ICD-10-CM

## 2012-03-04 DIAGNOSIS — I495 Sick sinus syndrome: Secondary | ICD-10-CM

## 2012-03-04 DIAGNOSIS — N182 Chronic kidney disease, stage 2 (mild): Secondary | ICD-10-CM

## 2012-03-04 DIAGNOSIS — Z8719 Personal history of other diseases of the digestive system: Secondary | ICD-10-CM

## 2012-03-04 DIAGNOSIS — A088 Other specified intestinal infections: Secondary | ICD-10-CM | POA: Diagnosis present

## 2012-03-04 DIAGNOSIS — Z7982 Long term (current) use of aspirin: Secondary | ICD-10-CM

## 2012-03-04 DIAGNOSIS — Z79899 Other long term (current) drug therapy: Secondary | ICD-10-CM

## 2012-03-04 DIAGNOSIS — Z9861 Coronary angioplasty status: Secondary | ICD-10-CM

## 2012-03-04 DIAGNOSIS — R609 Edema, unspecified: Secondary | ICD-10-CM

## 2012-03-04 DIAGNOSIS — Z0000 Encounter for general adult medical examination without abnormal findings: Secondary | ICD-10-CM

## 2012-03-04 DIAGNOSIS — K2270 Barrett's esophagus without dysplasia: Secondary | ICD-10-CM | POA: Diagnosis present

## 2012-03-04 DIAGNOSIS — I739 Peripheral vascular disease, unspecified: Secondary | ICD-10-CM

## 2012-03-04 DIAGNOSIS — M109 Gout, unspecified: Secondary | ICD-10-CM

## 2012-03-04 DIAGNOSIS — F411 Generalized anxiety disorder: Secondary | ICD-10-CM

## 2012-03-04 DIAGNOSIS — E785 Hyperlipidemia, unspecified: Secondary | ICD-10-CM

## 2012-03-04 DIAGNOSIS — N179 Acute kidney failure, unspecified: Principal | ICD-10-CM

## 2012-03-04 DIAGNOSIS — K219 Gastro-esophageal reflux disease without esophagitis: Secondary | ICD-10-CM

## 2012-03-04 DIAGNOSIS — R42 Dizziness and giddiness: Secondary | ICD-10-CM

## 2012-03-04 DIAGNOSIS — R001 Bradycardia, unspecified: Secondary | ICD-10-CM

## 2012-03-04 DIAGNOSIS — Z66 Do not resuscitate: Secondary | ICD-10-CM | POA: Diagnosis present

## 2012-03-04 DIAGNOSIS — IMO0002 Reserved for concepts with insufficient information to code with codable children: Secondary | ICD-10-CM

## 2012-03-04 DIAGNOSIS — Z794 Long term (current) use of insulin: Secondary | ICD-10-CM

## 2012-03-04 DIAGNOSIS — Z7902 Long term (current) use of antithrombotics/antiplatelets: Secondary | ICD-10-CM

## 2012-03-04 DIAGNOSIS — M51379 Other intervertebral disc degeneration, lumbosacral region without mention of lumbar back pain or lower extremity pain: Secondary | ICD-10-CM

## 2012-03-04 DIAGNOSIS — K5730 Diverticulosis of large intestine without perforation or abscess without bleeding: Secondary | ICD-10-CM

## 2012-03-04 DIAGNOSIS — I129 Hypertensive chronic kidney disease with stage 1 through stage 4 chronic kidney disease, or unspecified chronic kidney disease: Secondary | ICD-10-CM | POA: Diagnosis present

## 2012-03-04 DIAGNOSIS — R748 Abnormal levels of other serum enzymes: Secondary | ICD-10-CM

## 2012-03-04 DIAGNOSIS — R079 Chest pain, unspecified: Secondary | ICD-10-CM

## 2012-03-04 DIAGNOSIS — E119 Type 2 diabetes mellitus without complications: Secondary | ICD-10-CM

## 2012-03-04 DIAGNOSIS — B029 Zoster without complications: Secondary | ICD-10-CM

## 2012-03-04 DIAGNOSIS — R197 Diarrhea, unspecified: Secondary | ICD-10-CM

## 2012-03-04 DIAGNOSIS — M25512 Pain in left shoulder: Secondary | ICD-10-CM

## 2012-03-04 DIAGNOSIS — R109 Unspecified abdominal pain: Secondary | ICD-10-CM

## 2012-03-04 DIAGNOSIS — I5033 Acute on chronic diastolic (congestive) heart failure: Secondary | ICD-10-CM

## 2012-03-04 DIAGNOSIS — M5137 Other intervertebral disc degeneration, lumbosacral region: Secondary | ICD-10-CM

## 2012-03-04 DIAGNOSIS — I252 Old myocardial infarction: Secondary | ICD-10-CM

## 2012-03-04 DIAGNOSIS — I10 Essential (primary) hypertension: Secondary | ICD-10-CM

## 2012-03-04 DIAGNOSIS — F3289 Other specified depressive episodes: Secondary | ICD-10-CM

## 2012-03-04 DIAGNOSIS — E039 Hypothyroidism, unspecified: Secondary | ICD-10-CM | POA: Diagnosis present

## 2012-03-04 LAB — URINALYSIS, MICROSCOPIC ONLY
Bilirubin Urine: NEGATIVE
Glucose, UA: NEGATIVE mg/dL
Ketones, ur: NEGATIVE mg/dL
Nitrite: NEGATIVE
pH: 5.5 (ref 5.0–8.0)

## 2012-03-04 LAB — CBC WITH DIFFERENTIAL/PLATELET
Basophils Relative: 0 % (ref 0–1)
Hemoglobin: 11.8 g/dL — ABNORMAL LOW (ref 12.0–15.0)
Lymphocytes Relative: 22 % (ref 12–46)
Lymphs Abs: 1.8 10*3/uL (ref 0.7–4.0)
MCHC: 31.7 g/dL (ref 30.0–36.0)
Monocytes Relative: 5 % (ref 3–12)
Neutro Abs: 5.6 10*3/uL (ref 1.7–7.7)
Neutrophils Relative %: 68 % (ref 43–77)
RBC: 4.19 MIL/uL (ref 3.87–5.11)
WBC: 8.2 10*3/uL (ref 4.0–10.5)

## 2012-03-04 LAB — LIPASE, BLOOD: Lipase: 78 U/L — ABNORMAL HIGH (ref 11–59)

## 2012-03-04 LAB — COMPREHENSIVE METABOLIC PANEL
Albumin: 3.7 g/dL (ref 3.5–5.2)
Alkaline Phosphatase: 125 U/L — ABNORMAL HIGH (ref 39–117)
BUN: 78 mg/dL — ABNORMAL HIGH (ref 6–23)
CO2: 26 mEq/L (ref 19–32)
Chloride: 102 mEq/L (ref 96–112)
Potassium: 5.1 mEq/L (ref 3.5–5.1)
Total Bilirubin: 0.2 mg/dL — ABNORMAL LOW (ref 0.3–1.2)

## 2012-03-04 LAB — POCT I-STAT TROPONIN I

## 2012-03-04 LAB — LACTIC ACID, PLASMA: Lactic Acid, Venous: 1.2 mmol/L (ref 0.5–2.2)

## 2012-03-04 MED ORDER — SODIUM CHLORIDE 0.9 % IV BOLUS (SEPSIS)
500.0000 mL | Freq: Once | INTRAVENOUS | Status: AC
  Administered 2012-03-04: 500 mL via INTRAVENOUS

## 2012-03-04 MED ORDER — ONDANSETRON HCL 4 MG/2ML IJ SOLN
4.0000 mg | Freq: Once | INTRAMUSCULAR | Status: AC
  Administered 2012-03-04: 4 mg via INTRAVENOUS
  Filled 2012-03-04: qty 2

## 2012-03-04 MED ORDER — MORPHINE SULFATE 4 MG/ML IJ SOLN
4.0000 mg | Freq: Once | INTRAMUSCULAR | Status: AC
  Administered 2012-03-04: 4 mg via INTRAVENOUS
  Filled 2012-03-04: qty 1

## 2012-03-04 MED ORDER — ONDANSETRON HCL 4 MG/2ML IJ SOLN
4.0000 mg | Freq: Once | INTRAMUSCULAR | Status: AC
  Administered 2012-03-05: 4 mg via INTRAVENOUS
  Filled 2012-03-04: qty 2

## 2012-03-04 NOTE — ED Notes (Signed)
MD at bedside. 

## 2012-03-04 NOTE — ED Provider Notes (Addendum)
History     CSN: [id number]  Arrival date & time 03/04/12  Ernestina Columbia   First MD Initiated Contact with Patient 03/04/12 2018      Chief Complaint  Patient presents with  . Abdominal Pain  . Leg Pain  . Hypertension    (Consider location/radiation/quality/duration/timing/severity/associated sxs/prior treatment) Patient is a 78 y.o. female presenting with abdominal pain, leg pain, and hypertension. The history is provided by the patient.  Abdominal Pain The primary symptoms of the illness include abdominal pain, nausea and diarrhea. The primary symptoms of the illness do not include fever, shortness of breath, vomiting or dysuria. The current episode started yesterday. The onset of the illness was gradual. The problem has been gradually worsening.  The abdominal pain is generalized. The abdominal pain does not radiate. Pain scale: States she will get severe cramping pain that is up to a 10 and she will have a bowel movement and then will go back down to a 5 which is constant. The abdominal pain is relieved by bowel movement (Improvement with bowel movement but the pain has not ever gone away completely). Exacerbated by: Nothing.  The diarrhea began yesterday. The diarrhea is watery. The diarrhea occurs 5 to 10 times per day.  The patient has had a change in bowel habit. Risk factors for an acute abdominal problem include being elderly. Additional symptoms associated with the illness include anorexia. Symptoms associated with the illness do not include diaphoresis, constipation, urgency, frequency or back pain. Significant associated medical issues include diabetes. Significant associated medical issues do not include GERD, inflammatory bowel disease or liver disease.  Leg Pain  The incident occurred 3 to 5 hours ago. The incident occurred at home. Injury mechanism: Bent over and felt a pain in her left leg which has been persistent since. The pain is present in the left thigh. The pain is at a  severity of 10/10. The pain is severe. The pain has been constant since onset. Pertinent negatives include no numbness, no inability to bear weight, no muscle weakness, no loss of sensation and no tingling. The symptoms are aggravated by activity and bearing weight. She has tried nothing for the symptoms. The treatment provided no relief.  Hypertension Associated symptoms include abdominal pain. Pertinent negatives include no shortness of breath.    Past Medical History  Diagnosis Date  . CAD (coronary artery disease)     Multivessel s/p PCI w/DES 2008 and CABG 2011  . HTN (hypertension)   . Type II or unspecified type diabetes mellitus without mention of complication, not stated as uncontrolled   . PVD (peripheral vascular disease)   . Diverticulosis of colon   . Morbid obesity   . Gout   . Depression   . Osteoarthritis   . Osteopenia   . Hyperlipidemia   . LBP (low back pain)     Lumbar disc disease/lumbar spinal stenosis  . Disc disease, degenerative, cervical   . History of thrombocytopenia   . Anxiety   . GERD (gastroesophageal reflux disease)   . Barrett esophagus   . Gastroparesis   . Helicobacter pylori gastritis   . Allergic rhinitis   . Anemia   . Myocardial infarction   . Hypothyroidism   . H/O hiatal hernia     Past Surgical History  Procedure Date  . Coronary stent placement     Drug-eluting stent to the left anterior descending, circumflex and right coronary artery in Jan 2009  . Cholecystectomy   . Abdominal hysterectomy   .  Tubal ligation   . Ovarian cyst removal   . Coronary stent placement     x 3 Dr Excell Seltzer  . Coronary artery bypass graft     02/2010  . Eye surgery     BIL CATARACT REMOVAL 06/2010  . Shoulder arthroscopy 06/16/2011    Procedure: ARTHROSCOPY SHOULDER;  Surgeon: Kennieth Rad, MD;  Location: [facility] OR;  Service: Orthopedics;  Laterality: Left;  LEFT SHOULDER ARTHROSCOPY ACROMIALPLASTY, POSSIBLE MINI OPEN CUFF REPAIR   . Cardiac  catheterization     2011  DR COOPER (APPT NEXT WEEK)    Family History  Problem Relation Age of Onset  . Diabetes Mother   . Hypertension Mother   . Stroke Father   . Coronary artery disease Other     History  Substance Use Topics  . Smoking status: Former Games developer  . Smokeless tobacco: Not on file     Comment: quit 30 yrs ago  . Alcohol Use: No    OB History    Grav Para Term Preterm Abortions TAB SAB Ect Mult Living                  Review of Systems  Constitutional: Negative for fever and diaphoresis.  Respiratory: Negative for shortness of breath.   Gastrointestinal: Positive for nausea, abdominal pain, diarrhea and anorexia. Negative for vomiting and constipation.  Genitourinary: Negative for dysuria, urgency and frequency.  Musculoskeletal: Negative for back pain.  Neurological: Negative for tingling and numbness.  All other systems reviewed and are negative.    Allergies  Ciprofloxacin; Codeine; Diltiazem hcl; Hydrocodone; Lovastatin; Metformin; Penicillins; Shellfish allergy; and Sulfonamide derivatives  Home Medications   Current Outpatient Rx  Name  Route  Sig  Dispense  Refill  . ACETAMINOPHEN 500 MG PO TABS   Oral   Take 500 mg by mouth 2 (two) times daily as needed. For pain         . ALLOPURINOL 100 MG PO TABS   Oral   Take 100 mg by mouth 2 (two) times daily.          Marland Kitchen CLOPIDOGREL BISULFATE 75 MG PO TABS   Oral   Take 75 mg by mouth daily.         . COLCHICINE 0.6 MG PO TABS   Oral   Take 0.6 mg by mouth daily.         Marland Kitchen DICLOFENAC SODIUM 75 MG PO TBEC   Oral   Take 75 mg by mouth 2 (two) times daily.         . FUROSEMIDE 80 MG PO TABS   Oral   Take 80 mg by mouth daily.         . INSULIN ISOPHANE HUMAN 100 UNIT/ML [place] SUSP   Subcutaneous   Inject 10-90 Units into the skin 2 (two) times daily. Morning:  90 units if cbg over 140 and 45 units if cbg below 140; evening 10 units         . LEVOTHYROXINE SODIUM 100 MCG PO  TABS   Oral   Take 100 mcg by mouth daily.         Marland Kitchen OLMESARTAN-AMLODIPINE-HCTZ 40-5-25 MG PO TABS   Oral   Take 1 tablet by mouth daily.         Marland Kitchen PRESCRIPTION MEDICATION   Oral   Take 2 tablets by mouth every evening. Pt. On a study medication through [facility] cardiology         .  SITAGLIPTIN-METFORMIN HCL 50-500 MG PO TABS   Oral   Take 1 tablet by mouth daily.         . ASPIRIN 81 MG PO CHEW   Oral   Chew 81 mg by mouth daily.           Marland Kitchen POLYSACCHARIDE IRON COMPLEX 150 MG PO CAPS   Oral   Take 150 mg by mouth daily.           Marland Kitchen LEVOCETIRIZINE DIHYDROCHLORIDE 5 MG PO TABS   Oral   Take 5 mg by mouth daily as needed. For allergies          . LOSARTAN POTASSIUM 100 MG PO TABS      TAKE ONE TABLET BY MOUTH EVERY DAY   30 tablet   6   . NITROGLYCERIN 0.4 MG SL SUBL   Sublingual   Place 0.4 mg under the tongue as directed.           Marland Kitchen OMEPRAZOLE 20 MG PO CPDR   Oral   Take 20 mg by mouth 2 (two) times daily.          Marland Kitchen POTASSIUM CHLORIDE CRYS ER 20 MEQ PO TBCR   Oral   Take 20 mEq by mouth 2 (two) times daily.           Marland Kitchen PRAVASTATIN SODIUM 40 MG PO TABS   Oral   Take 1 tablet (40 mg total) by mouth every evening.   90 tablet   3   . SAXAGLIPTIN-METFORMIN ER 08-998 MG PO TB24   Oral   Take 1 tablet by mouth daily.             BP 132/39  Pulse 50  Temp 97.4 F (36.3 C) (Oral)  Resp 20  SpO2 100%  Physical Exam  Nursing note and vitals reviewed. Constitutional: She is oriented to person, place, and time. She appears well-developed and well-nourished. No distress.  HENT:  Head: Normocephalic and atraumatic.  Mouth/Throat: Oropharynx is clear and moist.  Eyes: Conjunctivae normal and EOM are normal. Pupils are equal, round, and reactive to light.  Neck: Normal range of motion. Neck supple.  Cardiovascular: Normal rate, regular rhythm and intact distal pulses.   No murmur heard. Pulmonary/Chest: Effort normal and breath sounds  normal. No respiratory distress. She has no wheezes. She has no rales.  Abdominal: Soft. She exhibits no distension. There is generalized tenderness. There is no rigidity, no rebound, no guarding and no CVA tenderness.       Diffuse mild tenderness. Multiple well-healed surgical scars  Musculoskeletal: Normal range of motion. She exhibits no edema and no tenderness.       Legs: Neurological: She is alert and oriented to person, place, and time.  Skin: Skin is warm and dry. No rash noted. No erythema.  Psychiatric: She has a normal mood and affect. Her behavior is normal.    ED Course  Procedures (including critical care time)  Labs Reviewed  CBC WITH DIFFERENTIAL - Abnormal; Notable for the following:    Hemoglobin 11.8 (*)     All other components within normal limits  COMPREHENSIVE METABOLIC PANEL - Abnormal; Notable for the following:    Glucose, Bld 129 (*)     BUN 78 (*)     Creatinine, Ser 2.08 (*)     Alkaline Phosphatase 125 (*)     Total Bilirubin 0.2 (*)     GFR calc non Af Amer 22 (*)  GFR calc Af Amer 25 (*)     All other components within normal limits  LIPASE, BLOOD - Abnormal; Notable for the following:    Lipase 78 (*)     All other components within normal limits  URINALYSIS, MICROSCOPIC ONLY - Abnormal; Notable for the following:    Color, Urine STRAW (*)     Squamous Epithelial / LPF FEW (*)     Casts HYALINE CASTS (*)     All other components within normal limits  POCT I-STAT TROPONIN I  LACTIC ACID, PLASMA  URINALYSIS, ROUTINE W REFLEX MICROSCOPIC   Ct Abdomen Pelvis Wo Contrast  03/04/2012  *RADIOLOGY REPORT*  Clinical Data: Abdominal pain; hypertension.  Nausea and diarrhea.  CT ABDOMEN AND PELVIS WITHOUT CONTRAST  Technique:  Multidetector CT imaging of the abdomen and pelvis was performed following the standard protocol without intravenous contrast.  Comparison: CT of the abdomen and pelvis performed 07/22/2004  Findings: The visualized lung bases  are clear.  Diffuse coronary artery calcifications are seen.  The patient is status post median sternotomy.  The liver and spleen are unremarkable in appearance.  The patient is status post cholecystectomy.  The pancreas and adrenal glands are unremarkable.  The kidneys are unremarkable in appearance.  There is no evidence of hydronephrosis.  No renal or ureteral stones are seen.  No perinephric stranding is appreciated.  No free fluid is identified.  The small bowel is unremarkable in appearance.  The stomach is within normal limits.  No acute vascular abnormalities are seen.  Scattered calcification is noted along the abdominal aorta and its branches.  The appendix is not definitely seen; there is no evidence for appendicitis.  The colon is largely decompressed.  Scattered diverticulosis is noted along the distal descending and proximal sigmoid colon, without evidence of diverticulitis.  The bladder is mildly distended and grossly unremarkable in appearance.  The patient is status post hysterectomy; no suspicious adnexal masses are seen.  No inguinal lymphadenopathy is seen.  No acute osseous abnormalities are identified.  Multilevel vacuum phenomenon and disc space narrowing noted along the lower thoracic and lumbar spine.  IMPRESSION:  1.  No acute abnormalities seen within the abdomen or pelvis. 2.  Scattered diverticulosis along the distal descending and proximal sigmoid colon, without evidence of diverticulitis. 3.  Scattered calcification along the abdominal aorta and its branches. 4.  Diffuse coronary artery calcifications seen. 5.  Mild diffuse degenerative change along the lower thoracic and lumbar spine.   Original Report Authenticated By: Tonia Ghent, M.D.      Date: 03/04/2012  Rate: 51  Rhythm: sinus bradycardia  QRS Axis: normal  Intervals: normal  ST/T Wave abnormalities: normal  Conduction Disutrbances:none  Narrative Interpretation:   Old EKG Reviewed: unchanged    1. Renal  insufficiency   2. Elevated lipase       MDM   Patient complains of abdominal cramping, nausea, loose stools since yesterday. She has mild tenderness on exam but no focal tenderness. Status post cholecystectomy and appendectomy. Denies any UA symptoms. Denies blood in stool, recent antibiotic use or bad food intake. She denies any chest pain or shortness of breath. Also after the symptoms started today she was bending over and developed a very sharp pain in her left leg. Her left hamstring is extremely tight and painful. Feel most likely she pulled her hamstring but she does not have sciatica type symptoms at this point. She is neurovascularly intact. She is bradycardic on exam however she  takes beta blocker therapy. EKG shows sinus bradycardia without acute abnormalities. Concern for diverticulitis versus UTI versus colitis versus gastroenteritis. CBC, CMP, lipase, UA, lactic acid pending. Patient given pain and nausea control.  11:53 PM Lipase elevated at 78. Creatinine is elevated today at 2 from 1. Patient's lactate is within normal limits. CT with oral contrast only shows no acute findings. On reevaluation patient still nauseated but has improved pain. Do to new acute renal insufficiency, elevated lipase & nausea will admit for observation.      Gwyneth Sprout, MD 03/04/12 1610  Gwyneth Sprout, MD 03/05/12 [id number]

## 2012-03-04 NOTE — ED Notes (Addendum)
Pt reports multiple complaints; states that she bent over today and hurt her L leg, reports having pain in leg up to buttocks since then; also reports having high BP today, running 220/85 when checked at home; also reports since yesterday having generalized abd pain with nausea and diarrhea; pt has + DP pulse and CMS intact in L leg

## 2012-03-04 NOTE — ED Notes (Signed)
Denies difficulty voiding. Last BM yesterday. Denies vomiting.

## 2012-03-05 ENCOUNTER — Inpatient Hospital Stay (HOSPITAL_COMMUNITY): Payer: Medicare Other

## 2012-03-05 ENCOUNTER — Other Ambulatory Visit: Payer: Self-pay

## 2012-03-05 ENCOUNTER — Encounter (HOSPITAL_COMMUNITY): Payer: Self-pay | Admitting: Physician Assistant

## 2012-03-05 DIAGNOSIS — N179 Acute kidney failure, unspecified: Secondary | ICD-10-CM

## 2012-03-05 DIAGNOSIS — E860 Dehydration: Secondary | ICD-10-CM | POA: Diagnosis present

## 2012-03-05 DIAGNOSIS — E119 Type 2 diabetes mellitus without complications: Secondary | ICD-10-CM

## 2012-03-05 DIAGNOSIS — N289 Disorder of kidney and ureter, unspecified: Secondary | ICD-10-CM

## 2012-03-05 DIAGNOSIS — R109 Unspecified abdominal pain: Secondary | ICD-10-CM

## 2012-03-05 DIAGNOSIS — N182 Chronic kidney disease, stage 2 (mild): Secondary | ICD-10-CM

## 2012-03-05 DIAGNOSIS — I498 Other specified cardiac arrhythmias: Secondary | ICD-10-CM

## 2012-03-05 DIAGNOSIS — R197 Diarrhea, unspecified: Secondary | ICD-10-CM

## 2012-03-05 LAB — GLUCOSE, CAPILLARY
Glucose-Capillary: 60 mg/dL — ABNORMAL LOW (ref 70–99)
Glucose-Capillary: 82 mg/dL (ref 70–99)
Glucose-Capillary: 128 mg/dL — ABNORMAL HIGH (ref 70–99)

## 2012-03-05 LAB — CBC
MCHC: 31.3 g/dL (ref 30.0–36.0)
Platelets: 127 10*3/uL — ABNORMAL LOW (ref 150–400)
RDW: 13.4 % (ref 11.5–15.5)
WBC: 7.8 10*3/uL (ref 4.0–10.5)

## 2012-03-05 LAB — BASIC METABOLIC PANEL
CO2: 24 mEq/L (ref 19–32)
Glucose, Bld: 122 mg/dL — ABNORMAL HIGH (ref 70–99)
Potassium: 3.8 mEq/L (ref 3.5–5.1)
Sodium: 138 mEq/L (ref 135–145)

## 2012-03-05 MED ORDER — INSULIN NPH (HUMAN) (ISOPHANE) 100 UNIT/ML ~~LOC~~ SUSP
45.0000 [IU] | Freq: Every day | SUBCUTANEOUS | Status: DC
  Filled 2012-03-05: qty 10

## 2012-03-05 MED ORDER — INSULIN ASPART 100 UNIT/ML ~~LOC~~ SOLN: 0.0000 [IU] | Freq: Every day | SUBCUTANEOUS | Status: DC

## 2012-03-05 MED ORDER — SODIUM CHLORIDE 0.9 % IV SOLN: INTRAVENOUS | Status: DC

## 2012-03-05 MED ORDER — POLYSACCHARIDE IRON COMPLEX 150 MG PO CAPS
150.0000 mg | ORAL_CAPSULE | Freq: Every day | ORAL | Status: DC
  Administered 2012-03-05 – 2012-03-08 (×4): 150 mg via ORAL
  Filled 2012-03-05 (×4): qty 1

## 2012-03-05 MED ORDER — LEVOTHYROXINE SODIUM 100 MCG PO TABS
100.0000 ug | ORAL_TABLET | Freq: Every evening | ORAL | Status: DC
  Administered 2012-03-05 – 2012-03-07 (×2): 100 ug via ORAL
  Filled 2012-03-05 (×4): qty 1

## 2012-03-05 MED ORDER — ENOXAPARIN SODIUM 30 MG/0.3ML ~~LOC~~ SOLN
30.0000 mg | SUBCUTANEOUS | Status: DC
  Administered 2012-03-05: 30 mg via SUBCUTANEOUS
  Filled 2012-03-05 (×2): qty 0.3

## 2012-03-05 MED ORDER — CLOPIDOGREL BISULFATE 75 MG PO TABS
75.0000 mg | ORAL_TABLET | Freq: Every evening | ORAL | Status: DC
  Administered 2012-03-05 – 2012-03-07 (×2): 75 mg via ORAL
  Filled 2012-03-05 (×4): qty 1

## 2012-03-05 MED ORDER — ALLOPURINOL 100 MG PO TABS
100.0000 mg | ORAL_TABLET | Freq: Every evening | ORAL | Status: DC
  Filled 2012-03-05: qty 1

## 2012-03-05 MED ORDER — SODIUM CHLORIDE 0.9 % IJ SOLN
3.0000 mL | Freq: Two times a day (BID) | INTRAMUSCULAR | Status: DC
  Administered 2012-03-05 (×2): 3 mL via INTRAVENOUS

## 2012-03-05 MED ORDER — MORPHINE SULFATE 2 MG/ML IJ SOLN
1.0000 mg | INTRAMUSCULAR | Status: DC | PRN
  Administered 2012-03-05: 1 mg via INTRAVENOUS
  Filled 2012-03-05: qty 1

## 2012-03-05 MED ORDER — ONDANSETRON HCL 4 MG PO TABS
4.0000 mg | ORAL_TABLET | Freq: Four times a day (QID) | ORAL | Status: DC | PRN
  Administered 2012-03-05: 4 mg via ORAL

## 2012-03-05 MED ORDER — PANTOPRAZOLE SODIUM 40 MG IV SOLR
40.0000 mg | INTRAVENOUS | Status: DC
  Administered 2012-03-05: 40 mg via INTRAVENOUS
  Filled 2012-03-05 (×2): qty 40

## 2012-03-05 MED ORDER — ACETAMINOPHEN 325 MG PO TABS
650.0000 mg | ORAL_TABLET | Freq: Four times a day (QID) | ORAL | Status: DC | PRN
  Administered 2012-03-05 – 2012-03-06 (×2): 650 mg via ORAL
  Filled 2012-03-05 (×2): qty 2

## 2012-03-05 MED ORDER — INSULIN ASPART 100 UNIT/ML ~~LOC~~ SOLN
0.0000 [IU] | Freq: Three times a day (TID) | SUBCUTANEOUS | Status: DC
  Administered 2012-03-05: 2 [IU] via SUBCUTANEOUS
  Administered 2012-03-06: 3 [IU] via SUBCUTANEOUS
  Administered 2012-03-07: 2 [IU] via SUBCUTANEOUS
  Administered 2012-03-07: 17:00:00 via SUBCUTANEOUS

## 2012-03-05 MED ORDER — ASPIRIN 81 MG PO CHEW
81.0000 mg | CHEWABLE_TABLET | Freq: Every evening | ORAL | Status: DC
  Administered 2012-03-05 – 2012-03-07 (×2): 81 mg via ORAL
  Filled 2012-03-05 (×2): qty 1

## 2012-03-05 MED ORDER — ONDANSETRON HCL 4 MG/2ML IJ SOLN
4.0000 mg | Freq: Four times a day (QID) | INTRAMUSCULAR | Status: DC | PRN
  Filled 2012-03-05: qty 2

## 2012-03-05 MED ORDER — INSULIN NPH (HUMAN) (ISOPHANE) 100 UNIT/ML ~~LOC~~ SUSP: 10.0000 [IU] | Freq: Two times a day (BID) | SUBCUTANEOUS | Status: DC

## 2012-03-05 MED ORDER — SODIUM CHLORIDE 0.9 % IV SOLN
INTRAVENOUS | Status: DC
  Administered 2012-03-05 (×2): via INTRAVENOUS

## 2012-03-05 MED ORDER — NITROGLYCERIN 0.4 MG SL SUBL: 0.4000 mg | SUBLINGUAL_TABLET | SUBLINGUAL | Status: DC | PRN

## 2012-03-05 MED ORDER — ACETAMINOPHEN 650 MG RE SUPP: 650.0000 mg | Freq: Four times a day (QID) | RECTAL | Status: DC | PRN

## 2012-03-05 MED ORDER — METRONIDAZOLE IN NACL 5-0.79 MG/ML-% IV SOLN
500.0000 mg | Freq: Three times a day (TID) | INTRAVENOUS | Status: DC
  Administered 2012-03-05: 500 mg via INTRAVENOUS
  Filled 2012-03-05 (×3): qty 100

## 2012-03-05 MED ORDER — INSULIN NPH (HUMAN) (ISOPHANE) 100 UNIT/ML ~~LOC~~ SUSP
10.0000 [IU] | Freq: Every day | SUBCUTANEOUS | Status: DC
  Administered 2012-03-05: 10 [IU] via SUBCUTANEOUS

## 2012-03-05 MED ORDER — SIMVASTATIN 20 MG PO TABS
20.0000 mg | ORAL_TABLET | Freq: Every day | ORAL | Status: DC
  Administered 2012-03-05: 20 mg via ORAL
  Filled 2012-03-05 (×2): qty 1

## 2012-03-05 NOTE — Progress Notes (Signed)
Pt requested to speak with doctor.  Pt alert and oriented x 4 and states she has not seen one all day.  Progress notes reviewed with patient regarding that MD and NP had seen her with hospitalist and cardiology.  Maren Reamer, NP notified.  Went over with patient why she was here and her treatments, pt reassured MD would see in am.  Pt verbalized understanding.

## 2012-03-05 NOTE — Progress Notes (Signed)
Patient seen and examined. Agree with note by Algis Downs, PA. Patient here with diarrhea and ARF. Is known to have sinus brady and has been having pauses of 2-3 sec on tele. For her diarrhea: DC empiric flagyl, wait for c. Diff PCR, has not had any further diarrhea since admission. Her acute on CKD Stage II is slightly worse since admission, renal US without signs of obstruction. Cont IVF. Likely 2/2 nephrotoxic meds including colchicine, NSAIDs and ARB. If continues to worsen will consider a renal consult tomorrow.  For her sinus brady and pauses we have contacted cardiology for a consultation. Will continue to follow.  Peggye Pitt, MD [facility] Pager: [phone]

## 2012-03-05 NOTE — Consult Note (Signed)
CARDIOLOGY CONSULT NOTE   Patient ID: [name] MRN: [id number] DOB/AGE: 04-20-1932 78 y.o.  Admit date: 03/04/2012  Primary Physician   Geraldo Pitter, MD Primary Cardiologist   [facility] (last OV 12/13/2011) Reason for Consultation   Bradycardia  NWG:[id number] [name] is Samantha 77 y.o. female with Samantha history of CAD. She had CABG 2011 and has Samantha history of bradycardia, not tolerating beta blockers or Cardizem. She had GI symptoms including nausea, diarrhea and weakness. She came to the hospital where she was found to have acute RI, dehydration and was admitted. Overnight, her HR was dropping into the 30s with Wenkebach and some dropped QRS complexes. Cardiology was asked to evaluate her.  [name] feels intermittent nausea today. The diarrhea has improved, but she is still on clear liquids only. She currently complains of severe 8-9/10 left jaw pain. There is some edema in this area with tenderness and in her mouth, Samantha palpable but non-tender mass. She denies any history of presyncope or syncope prior to her acute illness but has had some presyncope (possibly orthostatic) prior to admission. She has had morphine for pain, but is now nauseated.   Past Medical History  Diagnosis Date  . CAD (coronary artery disease)     Multivessel s/p PCI w/DES 2008 and CABG 2011  . HTN (hypertension)   . Type II or unspecified type diabetes mellitus without mention of complication, not stated as uncontrolled   . PVD (peripheral vascular disease)   . Diverticulosis of colon   . Morbid obesity   . Gout   . Depression   . Osteoarthritis   . Osteopenia   . Hyperlipidemia   . LBP (low back pain)     Lumbar disc disease/lumbar spinal stenosis  . Disc disease, degenerative, cervical   . History of thrombocytopenia   . Anxiety   . GERD (gastroesophageal reflux disease)   . Barrett esophagus   . Gastroparesis   . Helicobacter pylori gastritis   . Allergic rhinitis   . Anemia   . Myocardial infarction   .  Hypothyroidism   . H/O hiatal hernia      Past Surgical History  Procedure Date  . Coronary stent placement     Drug-eluting stent to the left anterior descending, circumflex and right coronary artery in Jan 2009  . Cholecystectomy   . Abdominal hysterectomy   . Tubal ligation   . Ovarian cyst removal   . Coronary stent placement     x 3 Dr Excell Seltzer  . Coronary artery bypass graft     02/2010  . Eye surgery     BIL CATARACT REMOVAL 06/2010  . Shoulder arthroscopy 06/16/2011    Procedure: ARTHROSCOPY SHOULDER;  Surgeon: Kennieth Rad, MD;  Location: [facility] OR;  Service: Orthopedics;  Laterality: Left;  LEFT SHOULDER ARTHROSCOPY ACROMIALPLASTY, POSSIBLE MINI OPEN CUFF REPAIR   . Cardiac catheterization     2011  DR COOPER (APPT NEXT WEEK)    Allergies  Allergen Reactions  . Ciprofloxacin Nausea And Vomiting    syncope  . Codeine Other (See Comments)    HALLUCINATIONS  . Diltiazem Hcl Other (See Comments)    : low heart rate  . Hydrocodone Other (See Comments)    Makes her pass out  . Lovastatin Other (See Comments)    Pt doesn't remember Samantha reaction  . Metformin Other (See Comments)    diarrhea  . Penicillins Hives  . Shellfish Allergy Hives  . Sulfonamide Derivatives Nausea  And Vomiting    I have reviewed the patient's current medications    . allopurinol  100 mg Oral QPM  . aspirin  81 mg Oral QPM  . clopidogrel  75 mg Oral QPM  . enoxaparin (LOVENOX) injection  30 mg Subcutaneous Q24H  . insulin aspart  0-15 Units Subcutaneous TID WC  . insulin aspart  0-5 Units Subcutaneous QHS  . insulin NPH  10 Units Subcutaneous Q supper  . insulin NPH  45 Units Subcutaneous QAC breakfast  . iron polysaccharides  150 mg Oral Daily  . levothyroxine  100 mcg Oral QPM  . metronidazole  500 mg Intravenous Q8H  . [COMPLETED]  morphine injection  4 mg Intravenous Once  . [COMPLETED] ondansetron  4 mg Intravenous Once  . [COMPLETED] ondansetron  4 mg Intravenous Once  . pantoprazole  (PROTONIX) IV  40 mg Intravenous Q24H  . simvastatin  20 mg Oral q1800  . [COMPLETED] sodium chloride  500 mL Intravenous Once  . sodium chloride  3 mL Intravenous Q12H  . [DISCONTINUED] sodium chloride   Intravenous STAT  . [DISCONTINUED] insulin NPH  10-45 Units Subcutaneous BID      . sodium chloride 100 mL/hr at 03/05/12 0302   acetaminophen, acetaminophen, morphine injection, nitroGLYCERIN, ondansetron (ZOFRAN) IV, ondansetron  Prior to Admission medications   Medication Sig Start Date End Date Taking? Authorizing Provider  acetaminophen (TYLENOL) 500 MG tablet Take 500 mg by mouth 2 (two) times daily as needed. For pain   Yes Historical Provider, MD  allopurinol (ZYLOPRIM) 100 MG tablet Take 100 mg by mouth every evening.    Yes Historical Provider, MD  aspirin 81 MG chewable tablet Chew 81 mg by mouth every evening.    Yes Historical Provider, MD  clopidogrel (PLAVIX) 75 MG tablet Take 75 mg by mouth every evening.    Yes Historical Provider, MD  colchicine 0.6 MG tablet Take 0.6 mg by mouth daily.   Yes Historical Provider, MD  diclofenac (VOLTAREN) 75 MG EC tablet Take 75 mg by mouth 2 (two) times daily.   Yes Historical Provider, MD  furosemide (LASIX) 80 MG tablet Take 80 mg by mouth every morning.    Yes Historical Provider, MD  insulin NPH (HUMULIN N,NOVOLIN N) 100 UNIT/ML injection Inject 10-90 Units into the skin 2 (two) times daily. Morning:  90 units if cbg over 140 and 45 units if cbg below 140; evening 10 units   Yes Historical Provider, MD  iron polysaccharides (NIFEREX) 150 MG capsule Take 150 mg by mouth daily.     Yes Historical Provider, MD  levothyroxine (SYNTHROID, LEVOTHROID) 100 MCG tablet Take 100 mcg by mouth every evening.    Yes Historical Provider, MD  nitroGLYCERIN (NITROSTAT) 0.4 MG SL tablet Place 0.4 mg under the tongue every 5 (five) minutes as needed. For chest pain   Yes Historical Provider, MD  Olmesartan-Amlodipine-HCTZ (TRIBENZOR) 40-5-25 MG TABS  Take 1 tablet by mouth daily.   Yes Historical Provider, MD  omeprazole (PRILOSEC) 20 MG capsule Take 20 mg by mouth 2 (two) times daily.    Yes Historical Provider, MD  potassium chloride (MICRO-K) 10 MEQ CR capsule Take 10 mEq by mouth every evening.   Yes Historical Provider, MD  pravastatin (PRAVACHOL) 40 MG tablet Take 1 tablet (40 mg total) by mouth every evening. 07/05/11 07/04/12 Yes Tonny Bollman, MD  PRESCRIPTION MEDICATION Take 2 tablets by mouth every evening. Pt. On Samantha study medication through [facility] cardiology   Yes  Historical Provider, MD  sitaGLIPtan-metformin (JANUMET) 50-500 MG per tablet Take 1 tablet by mouth every evening.    Yes Historical Provider, MD     History   Social History  . Marital Status: Widowed    Spouse Name: N/Samantha    Number of Children: N/Samantha  . Years of Education: N/Samantha   Occupational History  . RETIRED    Social History Main Topics  . Smoking status: Former Games developer  . Smokeless tobacco: Not on file     Comment: quit 30 yrs ago  . Alcohol Use: No  . Drug Use: No  . Sexually Active: Not on file   Other Topics Concern  . Not on file   Social History Narrative   Widowed 2004, lives alone..Family history is negative for premature coronary artery disease. Mother  died at age 24 with heart disease in her later years, father died at age 9 from Samantha stroke.Marland KitchenShe has 8 siblings, none of whom have coronary artery disease.     Family History  Problem Relation Age of Onset  . Diabetes Mother   . Hypertension Mother   . Stroke Father   . Coronary artery disease Mother      ROS: She has not had fevers or chills. She has some chronic arthralgias. Her reflux symptoms are well controlled on Prilosec and she denies any melena or bright red blood per rectum. She is Samantha full code. Full 14 point review of systems complete and found to be negative unless listed above.  Physical Exam: Blood pressure 123/67, pulse 54, temperature 97.8 F (36.6 C), temperature source  Oral, resp. rate 20, height 5\' 7"  (1.702 m), weight 219 lb 12.8 oz (99.7 kg), SpO2 100.00%.  General: Well developed, well nourished, female in moderate discomfort  Head: Eyes PERRLA, No xanthomas.   Normocephalic and atraumatic, oropharynx without edema or exudate. Possible mass (?infected gland) left buccal mucosa. Dentition: poor Lungs: bilateral  Heart: Heart slightly irregular rate and rhythm with S1, S2, no significant murmur. pulses are 2+ all 4 extrem.   Neck: No carotid bruits. No lymphadenopathy.  JVD not elevated. Abdomen: Bowel sounds present, abdomen soft and non-tender without masses or hernias noted. Msk:  No spine or cva tenderness. No weakness, no joint deformities or effusions. Extremities: No clubbing or cyanosis. No edema.  Neuro: Alert and oriented X 3. No focal deficits noted. Psych:  Good affect, responds appropriately Skin: No rashes or lesions noted.  Labs:   Lab Results  Component Value Date   WBC 7.8 03/05/2012   HGB 9.9* 03/05/2012   HCT 31.6* 03/05/2012   MCV 89.0 03/05/2012   PLT 127* 03/05/2012   No results found for this basename: INR in the last 72 hours  Lab 03/05/12 0500 03/04/12 1955  NA 138 --  K 3.8 --  CL 102 --  CO2 24 --  BUN 75* --  CREATININE 2.22* --  CALCIUM 8.1* --  PROT -- 6.9  BILITOT -- 0.2*  ALKPHOS -- 125*  ALT -- 13  AST -- 18  GLUCOSE 122* --    Basename 03/04/12 2003  TROPIPOC 0.01   Lipase  Date/Time Value Range Status  03/04/2012  7:55 PM 78* 11 - 59 U/L Final   Echo:  02/14/2010 Study Conclusions - Left ventricle: The cavity size was normal. Wall thickness was increased in Samantha pattern of mild LVH. Systolic function was normal. The estimated ejection fraction was in the range of 60% to 65%. Wall motion was normal;  there were no regional wall motion abnormalities. - Mitral valve: Calcified annulus. - Atrial septum: No defect or patent foramen ovale was identified.  ECG: 05-Mar-2012 02:45:31   Sinus  bradycardia with ectopic atrial beats and junctional escape  Tele:  SB with Type I 2nd degree AV block.  Junctional escape beats  Radiology:  Ct Abdomen Pelvis Wo Contrast 03/04/2012  *RADIOLOGY REPORT*  Clinical Data: Abdominal pain; hypertension.  Nausea and diarrhea.  CT ABDOMEN AND PELVIS WITHOUT CONTRAST  Technique:  Multidetector CT imaging of the abdomen and pelvis was performed following the standard protocol without intravenous contrast.  Comparison: CT of the abdomen and pelvis performed 07/22/2004  Findings: The visualized lung bases are clear.  Diffuse coronary artery calcifications are seen.  The patient is status post median sternotomy.  The liver and spleen are unremarkable in appearance.  The patient is status post cholecystectomy.  The pancreas and adrenal glands are unremarkable.  The kidneys are unremarkable in appearance.  There is no evidence of hydronephrosis.  No renal or ureteral stones are seen.  No perinephric stranding is appreciated.  No free fluid is identified.  The small bowel is unremarkable in appearance.  The stomach is within normal limits.  No acute vascular abnormalities are seen.  Scattered calcification is noted along the abdominal aorta and its branches.  The appendix is not definitely seen; there is no evidence for appendicitis.  The colon is largely decompressed.  Scattered diverticulosis is noted along the distal descending and proximal sigmoid colon, without evidence of diverticulitis.  The bladder is mildly distended and grossly unremarkable in appearance.  The patient is status post hysterectomy; no suspicious adnexal masses are seen.  No inguinal lymphadenopathy is seen.  No acute osseous abnormalities are identified.  Multilevel vacuum phenomenon and disc space narrowing noted along the lower thoracic and lumbar spine.  IMPRESSION:  1.  No acute abnormalities seen within the abdomen or pelvis. 2.  Scattered diverticulosis along the distal descending and proximal  sigmoid colon, without evidence of diverticulitis. 3.  Scattered calcification along the abdominal aorta and its branches. 4.  Diffuse coronary artery calcifications seen. 5.  Mild diffuse degenerative change along the lower thoracic and lumbar spine.   Original Report Authenticated By: Tonia Ghent, M.D.     ASSESSMENT AND PLAN:   The patient was seen today by Dr Tenny Craw, the patient evaluated and the data reviewed.   SINUS BRADYCARDIA - with variable Pwave morphology, Wenkebach at times and some dropped beats, ?junctional escape beats. She was having no symptoms attributable to bradycardia prior to her acute illness. Currently she feels very weak. She has not been on rate-lowering meds in the past due to bradycardia, but prior ECGs showed HRs 50s-60s. TSH is pending. MD advise on further evaluation. May need to defer in the setting of acute illness, get an event monitor and f/u in office with PN.  Principal Problem:  *Acute renal failure Active Problems:  DIABETES MELLITUS, TYPE II  HYPERLIPIDEMIA  GOUT  Morbid obesity  HYPERTENSION  GERD  Abdominal pain, unspecified site  Diarrhea  Dehydration   Signed: Theodore Demark 03/05/2012, 9:25 AM Co-Sign MD   Patient seen and examined.  Asked to see re bradycardia.  Patient  Admitted for diarrhea  Found to be bradycardic She denies dizziness Has felt weak which she attrib to diarrhea.  3 wks ago felt OK No CP or SOB ON exam:  BP 120s to 140s.  Lungs: CTA; Cardiac RRR   No signif  murmurs.  Ext  No edema  Patient has signif bradycardia  Maintaining adequate BP   Would follow on telemetry  Replete fluids, electrolytes.  Will most likely need pacer if no signif change. Not emergently.  Would take care of ID issues first.  Dietrich Pates

## 2012-03-05 NOTE — Progress Notes (Addendum)
MD Tresa Endo notified patient had pause of 4 seconds and HR recorded on telemetry of 27 with current HR 50s and range 30-50s. Patient asymptomatic, vitals stable, BP 147/55  Pulse 41  Temp 98 F (36.7 C) (Oral)  Resp 18  Ht 5\' 7"  (1.702 m)  Wt 99.7 kg (219 lb 12.8 oz)  BMI 34.43 kg/m2  SpO2 99%. Currently, medical issues of diarrhea, renal failure being evaluated/managed and thought related to illness and nephrotoxic medications. She has known sinus bradycardia and pauses up to 3 seconds. HR of 27 recorded with pause but will watch trend overnight. Continue to watch closely with continued telemetry and frequent vitals.   Leeann Must, MD  Patient had another pause of approximately 4.6 seconds. Asymptomatic, sleeping, vitals checked and stable, BP 147/55  Pulse 41  Temp 98 F (36.7 C) (Oral)  Resp 18  Ht 5\' 7"  (1.702 m)  Wt 99.7 kg (219 lb 12.8 oz)  BMI 34.43 kg/m2  SpO2 99%. Will initiate transfer to [facility] with pacer pads in the rooms. Hemodynamically stable and asymptomatic so increased monitoring only for now.   Leeann Must, MD

## 2012-03-05 NOTE — Progress Notes (Signed)
Pt had 4.37 second pause on telemetry.  HR as low as 27 on telemetry.  Pt c/o headache, BP 147/55.  Dr. Tresa Endo with [facility] notified of pts pause, bradycardia and wenkebach.  No new orders at present.  Strip posted to chart.  Will continue to monitor.

## 2012-03-05 NOTE — Progress Notes (Signed)
Pharmacy: Lovenox for VTE prophylaxis  OBJECTIVE:  Wt: 99.7 kg, Ht: 67 inches, BMI~34.4 SCr 2.22, CrCl~25 ml/min  ASSESSMENT:  78 y.o. F on lovenox for VTE prophylaxis -- current dosing appropriate given reduced renal function.  PLAN:  - Continue lovenox 30 mg SQ every 24 hours for VTE prophylaxis - Pharmacy will not plan on writing any additional notes but will continue to monitor and adjust if renal function improves to >30 ml/min.   Georgina Pillion, PharmD, BCPS Clinical Pharmacist Pager: [phone] 03/05/2012 11:59 AM

## 2012-03-05 NOTE — Progress Notes (Signed)
Pt arrived to [id number] via stretcher and tele in no acute distress.  Pt placed on tele with intermittent classical and wenkebach heart block noted on telemetry with rate as low as 35.  BP and oxygen saturation WNL.  Pt denies chest pain or shortness of breath.  EKG obtained and MD notified.  MD stated he will request cardiology consult.

## 2012-03-05 NOTE — H&P (Signed)
[facility] History and Physical  [name] YQI:[id number] DOB: 12/17/1932 DOA: 03/04/2012  Referring physician: Dr. Anitra Lauth PCP: Geraldo Pitter, MD  Specialists: [facility]  Chief Complaint: diarrhea, abdominal pain  [name] is a 78 y.o. female with multiple medical problems who presents to the emergency room with complaints of diarrhea, abdominal pain, generalized weakness. Patient denies any fever, she is diffuse abdominal pain which he describes as crampy. It is associated with diarrhea she is having 4-5 bowel movements a day. She describes this as mucous, she denies any hematochezia. She's felt very nauseous but not had any vomiting. She is able to keep down liquids but is having trouble with solids. She has not had any sick contacts or travel history, she's not eaten at any restaurants recently. She's not been on antibiotics recently. She was recently started on colchicine for gout last week. She is on multiple nephrotoxic medications including ARB, nonsteroidal anti-inflammatories, she is also on Lasix. She presents to the emergency room where she was having acute renal failure with creatinine of 2.0. She feels generally weak. She was referred for admission for further workup.  Review of Systems: Pertinent positives in history of present illness, otherwise negative  Past Medical History  Diagnosis Date  . CAD (coronary artery disease)     Multivessel s/p PCI w/DES 2008 and CABG 2011  . HTN (hypertension)   . Type II or unspecified type diabetes mellitus without mention of complication, not stated as uncontrolled   . PVD (peripheral vascular disease)   . Diverticulosis of colon   . Morbid obesity   . Gout   . Depression   . Osteoarthritis   . Osteopenia   . Hyperlipidemia   . LBP (low back pain)     Lumbar disc disease/lumbar spinal stenosis  . Disc disease, degenerative, cervical   . History of thrombocytopenia   . Anxiety   . GERD  (gastroesophageal reflux disease)   . Barrett esophagus   . Gastroparesis   . Helicobacter pylori gastritis   . Allergic rhinitis   . Anemia   . Myocardial infarction   . Hypothyroidism   . H/O hiatal hernia    Past Surgical History  Procedure Date  . Coronary stent placement     Drug-eluting stent to the left anterior descending, circumflex and right coronary artery in Jan 2009  . Cholecystectomy   . Abdominal hysterectomy   . Tubal ligation   . Ovarian cyst removal   . Coronary stent placement     x 3 Dr Excell Seltzer  . Coronary artery bypass graft     02/2010  . Eye surgery     BIL CATARACT REMOVAL 06/2010  . Shoulder arthroscopy 06/16/2011    Procedure: ARTHROSCOPY SHOULDER;  Surgeon: Kennieth Rad, MD;  Location: [facility] OR;  Service: Orthopedics;  Laterality: Left;  LEFT SHOULDER ARTHROSCOPY ACROMIALPLASTY, POSSIBLE MINI OPEN CUFF REPAIR   . Cardiac catheterization     2011  DR COOPER (APPT NEXT WEEK)   Social History:  reports that she has quit smoking. She does not have any smokeless tobacco history on file. She reports that she does not drink alcohol or use illicit drugs. Ambulates with the assistance of a cane  Allergies  Allergen Reactions  . Ciprofloxacin Nausea And Vomiting    syncope  . Codeine Other (See Comments)    HALLUCINATIONS  . Diltiazem Hcl Other (See Comments)    : low heart rate  . Hydrocodone Other (See Comments)  Makes her pass out  . Lovastatin Other (See Comments)    Pt doesn't remember a reaction  . Metformin Other (See Comments)    diarrhea  . Penicillins Hives  . Shellfish Allergy Hives  . Sulfonamide Derivatives Nausea And Vomiting    Family History  Problem Relation Age of Onset  . Diabetes Mother   . Hypertension Mother   . Stroke Father   . Coronary artery disease Other     Prior to Admission medications   Medication Sig Start Date End Date Taking? Authorizing Provider  acetaminophen (TYLENOL) 500 MG tablet Take 500 mg by mouth 2  (two) times daily as needed. For pain   Yes Historical Provider, MD  allopurinol (ZYLOPRIM) 100 MG tablet Take 100 mg by mouth every evening.    Yes Historical Provider, MD  aspirin 81 MG chewable tablet Chew 81 mg by mouth every evening.    Yes Historical Provider, MD  clopidogrel (PLAVIX) 75 MG tablet Take 75 mg by mouth every evening.    Yes Historical Provider, MD  colchicine 0.6 MG tablet Take 0.6 mg by mouth daily.   Yes Historical Provider, MD  diclofenac (VOLTAREN) 75 MG EC tablet Take 75 mg by mouth 2 (two) times daily.   Yes Historical Provider, MD  furosemide (LASIX) 80 MG tablet Take 80 mg by mouth every morning.    Yes Historical Provider, MD  insulin NPH (HUMULIN N,NOVOLIN N) 100 UNIT/ML injection Inject 10-90 Units into the skin 2 (two) times daily. Morning:  90 units if cbg over 140 and 45 units if cbg below 140; evening 10 units   Yes Historical Provider, MD  iron polysaccharides (NIFEREX) 150 MG capsule Take 150 mg by mouth daily.     Yes Historical Provider, MD  levothyroxine (SYNTHROID, LEVOTHROID) 100 MCG tablet Take 100 mcg by mouth every evening.    Yes Historical Provider, MD  nitroGLYCERIN (NITROSTAT) 0.4 MG SL tablet Place 0.4 mg under the tongue every 5 (five) minutes as needed. For chest pain   Yes Historical Provider, MD  Olmesartan-Amlodipine-HCTZ (TRIBENZOR) 40-5-25 MG TABS Take 1 tablet by mouth daily.   Yes Historical Provider, MD  omeprazole (PRILOSEC) 20 MG capsule Take 20 mg by mouth 2 (two) times daily.    Yes Historical Provider, MD  potassium chloride (MICRO-K) 10 MEQ CR capsule Take 10 mEq by mouth every evening.   Yes Historical Provider, MD  pravastatin (PRAVACHOL) 40 MG tablet Take 1 tablet (40 mg total) by mouth every evening. 07/05/11 07/04/12 Yes Tonny Bollman, MD  PRESCRIPTION MEDICATION Take 2 tablets by mouth every evening. Pt. On a study medication through [facility] cardiology   Yes Historical Provider, MD  sitaGLIPtan-metformin (JANUMET) 50-500 MG per  tablet Take 1 tablet by mouth every evening.    Yes Historical Provider, MD   Physical Exam: Filed Vitals:   03/04/12 2130 03/04/12 2145 03/04/12 2245 03/05/12 0111  BP: 126/68 138/54 112/43 102/40  Pulse: 41 56 40 43  Temp:    97.6 F (36.4 C)  TempSrc:    Oral  Resp:      SpO2: 100% 100% 100% 100%     General:  Does not appear in distress, she is nauseous, lying in bed  Eyes: Pupils are equal round react to light  ENT: Mucous membranes are dry no pharyngeal erythema  Neck: Supple  Cardiovascular: S1, S2, regular rate and rhythm, no pedal edema  Respiratory: Clear to auscultation bilaterally  Abdomen: Soft, no tenderness, no distention, bowel  sounds are active  Skin: Normal  Musculoskeletal: Deferred  Psychiatric: Normal affect, cooperative with exam   Neurologic: Grossly intact, nonfocal  Labs on Admission:  Basic Metabolic Panel:  Lab 03/04/12 9604  NA 140  K 5.1  CL 102  CO2 26  GLUCOSE 129*  BUN 78*  CREATININE 2.08*  CALCIUM 9.2  MG --  PHOS --   Liver Function Tests:  Lab 03/04/12 1955  AST 18  ALT 13  ALKPHOS 125*  BILITOT 0.2*  PROT 6.9  ALBUMIN 3.7    Lab 03/04/12 1955  LIPASE 78*  AMYLASE --   No results found for this basename: AMMONIA:5 in the last 168 hours CBC:  Lab 03/04/12 1955  WBC 8.2  NEUTROABS 5.6  HGB 11.8*  HCT 37.2  MCV 88.8  PLT 158   Cardiac Enzymes: No results found for this basename: CKTOTAL:5,CKMB:5,CKMBINDEX:5,TROPONINI:5 in the last 168 hours  BNP (last 3 results) No results found for this basename: PROBNP:3 in the last 8760 hours CBG: No results found for this basename: GLUCAP:5 in the last 168 hours  Radiological Exams on Admission: Ct Abdomen Pelvis Wo Contrast  03/04/2012  *RADIOLOGY REPORT*  Clinical Data: Abdominal pain; hypertension.  Nausea and diarrhea.  CT ABDOMEN AND PELVIS WITHOUT CONTRAST  Technique:  Multidetector CT imaging of the abdomen and pelvis was performed following the  standard protocol without intravenous contrast.  Comparison: CT of the abdomen and pelvis performed 07/22/2004  Findings: The visualized lung bases are clear.  Diffuse coronary artery calcifications are seen.  The patient is status post median sternotomy.  The liver and spleen are unremarkable in appearance.  The patient is status post cholecystectomy.  The pancreas and adrenal glands are unremarkable.  The kidneys are unremarkable in appearance.  There is no evidence of hydronephrosis.  No renal or ureteral stones are seen.  No perinephric stranding is appreciated.  No free fluid is identified.  The small bowel is unremarkable in appearance.  The stomach is within normal limits.  No acute vascular abnormalities are seen.  Scattered calcification is noted along the abdominal aorta and its branches.  The appendix is not definitely seen; there is no evidence for appendicitis.  The colon is largely decompressed.  Scattered diverticulosis is noted along the distal descending and proximal sigmoid colon, without evidence of diverticulitis.  The bladder is mildly distended and grossly unremarkable in appearance.  The patient is status post hysterectomy; no suspicious adnexal masses are seen.  No inguinal lymphadenopathy is seen.  No acute osseous abnormalities are identified.  Multilevel vacuum phenomenon and disc space narrowing noted along the lower thoracic and lumbar spine.  IMPRESSION:  1.  No acute abnormalities seen within the abdomen or pelvis. 2.  Scattered diverticulosis along the distal descending and proximal sigmoid colon, without evidence of diverticulitis. 3.  Scattered calcification along the abdominal aorta and its branches. 4.  Diffuse coronary artery calcifications seen. 5.  Mild diffuse degenerative change along the lower thoracic and lumbar spine.   Original Report Authenticated By: Tonia Ghent, M.D.     EKG: Independently reviewed. Sinus bradycardia with no acute ST-T  changes  Assessment/Plan Principal Problem:  *Acute renal failure Active Problems:  DIABETES MELLITUS, TYPE II  HYPERLIPIDEMIA  GOUT  Morbid obesity  HYPERTENSION  SINUS BRADYCARDIA  GERD  Abdominal pain, unspecified site  Diarrhea  Dehydration   1. Diarrhea. Possibly infectious etiology, including a viral gastroenteritis. CT of the abdomen and pelvis was unremarkable. Check stool C. difficile PCR.  Start empiric Flagyl. More likely, it is the patient's colchicine which is caused her symptoms. We will hold colchicine at this time. Continue supportive care with clear liquid diet which can be advanced as tolerated. Antiemetics for nausea. 2. Acute renal failure likely volume depletion in the setting of ACE inhibitors, nonsteroidal anti-inflammatories, diuretics. Hold diuretics at this time and give IV fluids. We'll check renal ultrasound. 3. Diabetes. Hold oral hypoglycemics and use sliding scale insulin 4. Hypertension. Hold antihypertensives for now until blood pressure improves. 5. Sinus bradycardia. Appears to be chronic problem. She's not on any rate lowering medications. We will monitor on telemetry. 6. Gout. Continue allopurinol. She does not have an acute flare. 7. Hyperlipidemia. Patient is on a study medication through [facility]. The patient has been on this medication for many years, and as it is unlikely to contribute to her present complaints. She has been asked to bring this in from home, so she may continue it in the hospital.  Code Status: DNR Family Communication: discussed with patient at bedside, no family present Disposition Plan: 1-2 days, discharge home once medically improved  Time spent:  MEMON,JEHANZEB [facility] Pager [phone]  If 7PM-7AM, please contact night-coverage [email or web address] Password [email or web address] 03/05/2012, 1:32 AM

## 2012-03-05 NOTE — Progress Notes (Signed)
[facility] PROGRESS NOTE  Caroljean Monsivais Lennartz ZOX:[id number] DOB: Jul 01, 1932 DOA: 03/04/2012 PCP: Geraldo Pitter, MD  Assessment/Plan  Diarrhea.  Possibly infectious etiology, including a viral gastroenteritis vs medication related (colchicine).   CT of the abdomen and pelvis was unremarkable.   Check stool C. difficile PCR. Started empiric Flagyl.   Hold colchicine at this time.   Advance diet as tolerated.   Antiemetics for nausea.  Acute renal failure  Dehydration in combination with Medications  ACE inhibitors, nonsteroidal anti-inflammatories, diuretics on hold, stop   Check renal ultrasound.  Diabetes.   Hold oral hypoglycemics and use sliding scale insulin  Hypertension.   Hold antihypertensives for now until blood pressure improves.  Blood pressures vacillating - Will had amlodipine back when they stabilize and are elevated.   Sinus bradycardia.   Dipping into the 20s on Tele, primarily staying in the 40 - 50s with some arrythmia  She's not on any rate lowering medications.  Hx of CABG in 2011 and Cath in 11/2010  Recheck Echo today.  [facility] consulted.  We will monitor on telemetry.  Gout.   Pain in left foot 1 week ago.  No pain now.  discontinue allopurinol. She does not have an acute flare.  Hyperlipidemia.   Patient is on a study medication through [facility].   The patient has been on this medication for many years, and as it is unlikely to contribute to her present complaints. She has been asked to bring this in from home, so she may continue it in the hospital.   Code Status: DNR Family Communication:  Disposition Plan: Remain inpatient   Consultants:  [facility], (Regular patient if Dr. Excell Seltzer)  Procedures:  Echo Pending  Antibiotics:    HPI/Subjective: No diarrhea overnight.  No pain in left foot.  On new BP medication.  Objective: Filed Vitals:   03/04/12 2245 03/05/12 0111 03/05/12  0222 03/05/12 0617  BP: 112/43 102/40 139/51 123/67  Pulse: 40 43 49 54  Temp:  97.6 F (36.4 C) 97.9 F (36.6 C) 97.8 F (36.6 C)  TempSrc:  Oral Oral   Resp:    20  Height:   5\' 7"  (1.702 m)   Weight:   99.7 kg (219 lb 12.8 oz)   SpO2: 100% 100% 95% 100%    Intake/Output Summary (Last 24 hours) at 03/05/12 8119 Last data filed at 03/05/12 0800  Gross per 24 hour  Intake 996.67 ml  Output    475 ml  Net 521.67 ml   Filed Weights   03/05/12 0222  Weight: 99.7 kg (219 lb 12.8 oz)    Exam:   General:  Sleeping but awakens easily, A&O, NAD  Cardiovascular: very difficult to hear, bradycardic, Lower Extremity edema  Respiratory: CTA no W/C/R  Abdomen: Obese, NT, ND, +BS, No Masses  Extremities:  Able to move all four.  No redness, heat, or swelling in feet (no signs of acute gout)  Data Reviewed: Basic Metabolic Panel:  Lab 03/05/12 1478 03/04/12 1955  NA 138 140  K 3.8 5.1  CL 102 102  CO2 24 26  GLUCOSE 122* 129*  BUN 75* 78*  CREATININE 2.22* 2.08*  CALCIUM 8.1* 9.2  MG -- --  PHOS -- --   Liver Function Tests:  Lab 03/04/12 1955  AST 18  ALT 13  ALKPHOS 125*  BILITOT 0.2*  PROT 6.9  ALBUMIN 3.7    Lab 03/04/12 1955  LIPASE 78*  AMYLASE --   CBC:  Lab 03/05/12 0500 03/04/12 1955  WBC 7.8 8.2  NEUTROABS -- 5.6  HGB 9.9* 11.8*  HCT 31.6* 37.2  MCV 89.0 88.8  PLT 127* 158   CBG:  Lab 03/05/12 0756 03/05/12 0346 03/05/12 0301  GLUCAP 107* 82 60*     Studies: Ct Abdomen Pelvis Wo Contrast  03/04/2012  *RADIOLOGY REPORT*  Clinical Data: Abdominal pain; hypertension.  Nausea and diarrhea.  CT ABDOMEN AND PELVIS WITHOUT CONTRAST  Technique:  Multidetector CT imaging of the abdomen and pelvis was performed following the standard protocol without intravenous contrast.  Comparison: CT of the abdomen and pelvis performed 07/22/2004  Findings: The visualized lung bases are clear.  Diffuse coronary artery calcifications are seen.  The patient  is status post median sternotomy.  The liver and spleen are unremarkable in appearance.  The patient is status post cholecystectomy.  The pancreas and adrenal glands are unremarkable.  The kidneys are unremarkable in appearance.  There is no evidence of hydronephrosis.  No renal or ureteral stones are seen.  No perinephric stranding is appreciated.  No free fluid is identified.  The small bowel is unremarkable in appearance.  The stomach is within normal limits.  No acute vascular abnormalities are seen.  Scattered calcification is noted along the abdominal aorta and its branches.  The appendix is not definitely seen; there is no evidence for appendicitis.  The colon is largely decompressed.  Scattered diverticulosis is noted along the distal descending and proximal sigmoid colon, without evidence of diverticulitis.  The bladder is mildly distended and grossly unremarkable in appearance.  The patient is status post hysterectomy; no suspicious adnexal masses are seen.  No inguinal lymphadenopathy is seen.  No acute osseous abnormalities are identified.  Multilevel vacuum phenomenon and disc space narrowing noted along the lower thoracic and lumbar spine.  IMPRESSION:  1.  No acute abnormalities seen within the abdomen or pelvis. 2.  Scattered diverticulosis along the distal descending and proximal sigmoid colon, without evidence of diverticulitis. 3.  Scattered calcification along the abdominal aorta and its branches. 4.  Diffuse coronary artery calcifications seen. 5.  Mild diffuse degenerative change along the lower thoracic and lumbar spine.   Original Report Authenticated By: Tonia Ghent, M.D.     Scheduled Meds:   . allopurinol  100 mg Oral QPM  . aspirin  81 mg Oral QPM  . clopidogrel  75 mg Oral QPM  . enoxaparin (LOVENOX) injection  30 mg Subcutaneous Q24H  . insulin aspart  0-15 Units Subcutaneous TID WC  . insulin aspart  0-5 Units Subcutaneous QHS  . insulin NPH  10 Units Subcutaneous Q supper   . insulin NPH  45 Units Subcutaneous QAC breakfast  . iron polysaccharides  150 mg Oral Daily  . levothyroxine  100 mcg Oral QPM  . metronidazole  500 mg Intravenous Q8H  . [COMPLETED]  morphine injection  4 mg Intravenous Once  . [COMPLETED] ondansetron  4 mg Intravenous Once  . [COMPLETED] ondansetron  4 mg Intravenous Once  . pantoprazole (PROTONIX) IV  40 mg Intravenous Q24H  . simvastatin  20 mg Oral q1800  . [COMPLETED] sodium chloride  500 mL Intravenous Once  . sodium chloride  3 mL Intravenous Q12H  . [DISCONTINUED] sodium chloride   Intravenous STAT  . [DISCONTINUED] insulin NPH  10-45 Units Subcutaneous BID   Continuous Infusions:   . sodium chloride 100 mL/hr at 03/05/12 0302    Principal Problem:  *Acute renal failure Active Problems:  DIABETES  MELLITUS, TYPE II  HYPERLIPIDEMIA  GOUT  Morbid obesity  HYPERTENSION  SINUS BRADYCARDIA  GERD  Abdominal pain, unspecified site  Diarrhea  Dehydration    Time spent: 30 min    Conley Canal  [facility] Pager [phone]. If 8PM-8AM, please contact night-coverage at [email or web address], password [email or web address] 03/05/2012, 9:38 AM  LOS: 1 day

## 2012-03-06 ENCOUNTER — Encounter (HOSPITAL_COMMUNITY)
Admission: EM | Disposition: A | Discharge status: Home / Self Care | Payer: Self-pay | Source: Home / Self Care | Attending: Internal Medicine

## 2012-03-06 DIAGNOSIS — I495 Sick sinus syndrome: Secondary | ICD-10-CM

## 2012-03-06 DIAGNOSIS — I517 Cardiomegaly: Secondary | ICD-10-CM

## 2012-03-06 DIAGNOSIS — N182 Chronic kidney disease, stage 2 (mild): Secondary | ICD-10-CM

## 2012-03-06 HISTORY — PX: PACEMAKER INSERTION: SHX728

## 2012-03-06 HISTORY — PX: PERMANENT PACEMAKER INSERTION: SHX5480

## 2012-03-06 LAB — RENAL FUNCTION PANEL
CO2: 24 mEq/L (ref 19–32)
Calcium: 8.4 mg/dL (ref 8.4–10.5)
GFR calc Af Amer: 43 mL/min — ABNORMAL LOW (ref >90)
GFR calc non Af Amer: 37 mL/min — ABNORMAL LOW (ref >90)
Potassium: 4 mEq/L (ref 3.5–5.1)
Sodium: 140 mEq/L (ref 135–145)

## 2012-03-06 LAB — GLUCOSE, CAPILLARY
Glucose-Capillary: 175 mg/dL — ABNORMAL HIGH (ref 70–99)
Glucose-Capillary: 100 mg/dL — ABNORMAL HIGH (ref 70–99)

## 2012-03-06 LAB — T3, FREE: T3, Free: 2.1 pg/mL — ABNORMAL LOW (ref 2.3–4.2)

## 2012-03-06 LAB — CBC
MCH: 28.3 pg (ref 26.0–34.0)
Platelets: 106 10*3/uL — ABNORMAL LOW (ref 150–400)
RBC: 3.43 MIL/uL — ABNORMAL LOW (ref 3.87–5.11)
WBC: 5.8 10*3/uL (ref 4.0–10.5)

## 2012-03-06 SURGERY — PERMANENT PACEMAKER INSERTION
Anesthesia: LOCAL

## 2012-03-06 MED ORDER — ENOXAPARIN SODIUM 40 MG/0.4ML ~~LOC~~ SOLN
40.0000 mg | SUBCUTANEOUS | Status: DC
  Filled 2012-03-06: qty 0.4

## 2012-03-06 MED ORDER — ACETAMINOPHEN 325 MG PO TABS: 325.0000 mg | ORAL_TABLET | ORAL | Status: DC | PRN

## 2012-03-06 MED ORDER — INSULIN NPH (HUMAN) (ISOPHANE) 100 UNIT/ML ~~LOC~~ SUSP
10.0000 [IU] | Freq: Every day | SUBCUTANEOUS | Status: DC
  Administered 2012-03-07: 10 [IU] via SUBCUTANEOUS
  Filled 2012-03-06: qty 10

## 2012-03-06 MED ORDER — CHLORHEXIDINE GLUCONATE 4 % EX LIQD: 60.0000 mL | Freq: Once | CUTANEOUS | Status: DC

## 2012-03-06 MED ORDER — MIDAZOLAM HCL 2 MG/2ML IJ SOLN
INTRAMUSCULAR | Status: AC
  Filled 2012-03-06: qty 2

## 2012-03-06 MED ORDER — HEPARIN (PORCINE) IN NACL 2-0.9 UNIT/ML-% IJ SOLN
INTRAMUSCULAR | Status: AC
  Filled 2012-03-06: qty 500

## 2012-03-06 MED ORDER — FENTANYL CITRATE 0.05 MG/ML IJ SOLN
INTRAMUSCULAR | Status: AC
  Filled 2012-03-06: qty 2

## 2012-03-06 MED ORDER — HYDRALAZINE HCL 25 MG PO TABS
25.0000 mg | ORAL_TABLET | Freq: Three times a day (TID) | ORAL | Status: DC
  Administered 2012-03-06 – 2012-03-08 (×6): 25 mg via ORAL
  Filled 2012-03-06 (×12): qty 1

## 2012-03-06 MED ORDER — SODIUM CHLORIDE 0.45 % IV SOLN
INTRAVENOUS | Status: DC
  Administered 2012-03-06: 50 mL/h via INTRAVENOUS

## 2012-03-06 MED ORDER — SODIUM CHLORIDE 0.9 % IJ SOLN: 3.0000 mL | INTRAMUSCULAR | Status: DC | PRN

## 2012-03-06 MED ORDER — CHLORHEXIDINE GLUCONATE 4 % EX LIQD
CUTANEOUS | Status: AC
  Filled 2012-03-06: qty 30

## 2012-03-06 MED ORDER — SODIUM CHLORIDE 0.9 % IV SOLN
INTRAVENOUS | Status: DC
  Administered 2012-03-06: 11:00:00 via INTRAVENOUS

## 2012-03-06 MED ORDER — SODIUM CHLORIDE 0.9 % IV SOLN: 250.0000 mL | INTRAVENOUS | Status: DC | PRN

## 2012-03-06 MED ORDER — LIDOCAINE HCL (PF) 1 % IJ SOLN
INTRAMUSCULAR | Status: AC
  Filled 2012-03-06: qty 60

## 2012-03-06 MED ORDER — SODIUM CHLORIDE 0.9 % IR SOLN
80.0000 mg | Status: DC
  Filled 2012-03-06: qty 2

## 2012-03-06 MED ORDER — SODIUM CHLORIDE 0.9 % IJ SOLN
3.0000 mL | Freq: Two times a day (BID) | INTRAMUSCULAR | Status: DC
  Administered 2012-03-06 – 2012-03-08 (×4): 3 mL via INTRAVENOUS

## 2012-03-06 MED ORDER — PANTOPRAZOLE SODIUM 40 MG PO TBEC
40.0000 mg | DELAYED_RELEASE_TABLET | Freq: Every day | ORAL | Status: DC
  Administered 2012-03-06 – 2012-03-07 (×2): 40 mg via ORAL
  Filled 2012-03-06 (×2): qty 1

## 2012-03-06 MED ORDER — ONDANSETRON HCL 4 MG/2ML IJ SOLN: 4.0000 mg | Freq: Four times a day (QID) | INTRAMUSCULAR | Status: DC | PRN

## 2012-03-06 MED ORDER — VANCOMYCIN HCL IN DEXTROSE 1-5 GM/200ML-% IV SOLN
1000.0000 mg | Freq: Two times a day (BID) | INTRAVENOUS | Status: AC
  Administered 2012-03-07: 1000 mg via INTRAVENOUS
  Filled 2012-03-06: qty 200

## 2012-03-06 MED ORDER — INSULIN NPH (HUMAN) (ISOPHANE) 100 UNIT/ML ~~LOC~~ SUSP
45.0000 [IU] | Freq: Every day | SUBCUTANEOUS | Status: DC
  Administered 2012-03-07 – 2012-03-08 (×2): 45 [IU] via SUBCUTANEOUS
  Filled 2012-03-06 (×2): qty 10

## 2012-03-06 MED ORDER — VANCOMYCIN HCL IN DEXTROSE 1-5 GM/200ML-% IV SOLN
1000.0000 mg | INTRAVENOUS | Status: DC
  Filled 2012-03-06: qty 200

## 2012-03-06 NOTE — Progress Notes (Signed)
Patient ID: [name], female   DOB: 1932/07/27, 78 y.o.   MRN: [id number] [facility] PROGRESS NOTE  [name] [id number] DOB: 1932/05/09 DOA: 03/04/2012 PCP: Geraldo Pitter, MD  Assessment/Plan  Diarrhea - Resolved.  Possibly infectious etiology, including a viral gastroenteritis vs medication related (colchicine).   CT of the abdomen and pelvis was unremarkable.   Hold colchicine at this time.   Advance diet as tolerated.   Antiemetics for nausea.  No bowel movement since Sunday.    Acute renal failure - resolving nicely  Dehydration in combination with Medications  ACE inhibitors, nonsteroidal anti-inflammatories, diuretics on hold  Renal ultrasound reassuring.  Diabetes.   Hold oral hypoglycemics and use sliding scale insulin.  Once she is no longer NPO we will be able to resume her PTA insulin (70/30)  Hypertension.   As diarrhea has resolved, blood pressure has improved and now HTN is back.  Would prefer to defer BP management to cardiology, but will start on hydralazine 25 mg po tid.  Avoid BB, and be judicious with CCB. diuretics  Sinus bradycardia.   Transferred to 2900 overnight for bradycardia with 4+ second pauses.  Dipping into the 20s on Tele, primarily staying in the 40 - 50s with some arrythmia  She's not on any rate lowering medications.  Hx of CABG in 2011 and Cath in 11/2010  Echo completed. Results pending.  [facility] on board and evaluating for possibly pacemaker.  (Thank you)  monitor on telemetry.  Gout.   Pain in left foot 1 week ago.  No pain now.  discontinue allopurinol. She does not have an acute flare.  Hyperlipidemia.   Patient is on a study medication through [facility].   The patient has been on this medication for many years, and as it is unlikely to contribute to her present complaints. She has been asked to bring this in from home, so she may continue it in the hospital.   Code  Status: DNR Family Communication:  Disposition Plan: Remain inpatient pending cardiology recommendations / procedures. If she is not going to receive a pacemaker she is stable for discharge.    Consultants:  [facility], (Regular patient of Dr. Excell Seltzer)  Procedures:  Echo completed, results pending.  Antibiotics:    HPI/Subjective: No diarrhea since Sunday.   No pain in left foot.  Back hurting from being in hospital bed.  Objective: Filed Vitals:   03/06/12 0430 03/06/12 0500 03/06/12 0600 03/06/12 0807  BP: 129/23 118/46 120/41 167/35  Pulse: 37 51 40 50  Temp:    98 F (36.7 C)  TempSrc:    Oral  Resp: 14 16 21    Height:      Weight:      SpO2: 100% 100% 100% 100%    Intake/Output Summary (Last 24 hours) at 03/06/12 1135 Last data filed at 03/06/12 5784  Gross per 24 hour  Intake   1360 ml  Output   2950 ml  Net  -1590 ml   Filed Weights   03/05/12 0222 03/06/12 0300  Weight: 99.7 kg (219 lb 12.8 oz) 99.6 kg (219 lb 9.3 oz)    Exam:   General:   A&O, NAD, pleasant  Cardiovascular:  Bradycardic no obvious M/R/G  Respiratory: CTA no W/C/R  Abdomen: Obese, NT, ND, +BS, No Masses  Extremities:  Able to move all four.  No redness, heat, or swelling in feet (no signs of acute gout)  Data Reviewed: Basic Metabolic Panel:  Lab  03/06/12 0500 03/05/12 0500 03/04/12 1955  NA 140 138 140  K 4.0 3.8 5.1  CL 108 102 102  CO2 24 24 26   GLUCOSE 88 122* 129*  BUN 48* 75* 78*  CREATININE 1.33* 2.22* 2.08*  CALCIUM 8.4 8.1* 9.2  MG -- -- --  PHOS 3.0 -- --   Liver Function Tests:  Lab 03/06/12 0500 03/04/12 1955  AST -- 18  ALT -- 13  ALKPHOS -- 125*  BILITOT -- 0.2*  PROT -- 6.9  ALBUMIN 2.9* 3.7    Lab 03/04/12 1955  LIPASE 78*  AMYLASE --   CBC:  Lab 03/06/12 0500 03/05/12 0500 03/04/12 1955  WBC 5.8 7.8 8.2  NEUTROABS -- -- 5.6  HGB 9.7* 9.9* 11.8*  HCT 30.7* 31.6* 37.2  MCV 89.5 89.0 88.8  PLT 106* 127* 158   CBG:  Lab  03/06/12 0819 03/06/12 0616 03/05/12 2109 03/05/12 1648 03/05/12 1143  GLUCAP 86 91 144* 128* 86     Studies: Ct Abdomen Pelvis Wo Contrast  03/04/2012  *RADIOLOGY REPORT*  Clinical Data: Abdominal pain; hypertension.  Nausea and diarrhea.  CT ABDOMEN AND PELVIS WITHOUT CONTRAST  Technique:  Multidetector CT imaging of the abdomen and pelvis was performed following the standard protocol without intravenous contrast.  Comparison: CT of the abdomen and pelvis performed 07/22/2004  Findings: The visualized lung bases are clear.  Diffuse coronary artery calcifications are seen.  The patient is status post median sternotomy.  The liver and spleen are unremarkable in appearance.  The patient is status post cholecystectomy.  The pancreas and adrenal glands are unremarkable.  The kidneys are unremarkable in appearance.  There is no evidence of hydronephrosis.  No renal or ureteral stones are seen.  No perinephric stranding is appreciated.  No free fluid is identified.  The small bowel is unremarkable in appearance.  The stomach is within normal limits.  No acute vascular abnormalities are seen.  Scattered calcification is noted along the abdominal aorta and its branches.  The appendix is not definitely seen; there is no evidence for appendicitis.  The colon is largely decompressed.  Scattered diverticulosis is noted along the distal descending and proximal sigmoid colon, without evidence of diverticulitis.  The bladder is mildly distended and grossly unremarkable in appearance.  The patient is status post hysterectomy; no suspicious adnexal masses are seen.  No inguinal lymphadenopathy is seen.  No acute osseous abnormalities are identified.  Multilevel vacuum phenomenon and disc space narrowing noted along the lower thoracic and lumbar spine.  IMPRESSION:  1.  No acute abnormalities seen within the abdomen or pelvis. 2.  Scattered diverticulosis along the distal descending and proximal sigmoid colon, without  evidence of diverticulitis. 3.  Scattered calcification along the abdominal aorta and its branches. 4.  Diffuse coronary artery calcifications seen. 5.  Mild diffuse degenerative change along the lower thoracic and lumbar spine.   Original Report Authenticated By: Tonia Ghent, M.D.    US Renal  03/05/2012  *RADIOLOGY REPORT*  Clinical Data: Acute renal failure.  RENAL/URINARY TRACT ULTRASOUND COMPLETE  Comparison:  CT scan dated 03/04/2012  Findings:  Right Kidney:  Normal.  9.6 cm in length.  Left Kidney:  Normal.  10.9 cm in length.  Bladder:  Normal.  IMPRESSION: Normal renal ultrasound.  No evidence of obstruction or renal cortical thinning.   Original Report Authenticated By: Francene Boyers, M.D.     Scheduled Meds:    . aspirin  81 mg Oral QPM  . clopidogrel  75 mg Oral QPM  . enoxaparin (LOVENOX) injection  40 mg Subcutaneous Q24H  . hydrALAZINE  25 mg Oral Q8H  . insulin aspart  0-15 Units Subcutaneous TID WC  . insulin aspart  0-5 Units Subcutaneous QHS  . insulin NPH  10 Units Subcutaneous Q supper  . insulin NPH  45 Units Subcutaneous QAC breakfast  . iron polysaccharides  150 mg Oral Daily  . levothyroxine  100 mcg Oral QPM  . pantoprazole  40 mg Oral Daily  . sodium chloride  3 mL Intravenous Q12H  . [DISCONTINUED] enoxaparin (LOVENOX) injection  30 mg Subcutaneous Q24H  . [DISCONTINUED] insulin NPH  10 Units Subcutaneous Q supper  . [DISCONTINUED] insulin NPH  45 Units Subcutaneous QAC breakfast  . [DISCONTINUED] pantoprazole (PROTONIX) IV  40 mg Intravenous Q24H  . [DISCONTINUED] simvastatin  20 mg Oral q1800   Continuous Infusions:    . [EXPIRED] sodium chloride 100 mL/hr at 03/05/12 2331  . sodium chloride 50 mL/hr at 03/06/12 1119    Principal Problem:  *Acute renal failure Active Problems:  DIABETES MELLITUS, TYPE II  HYPERLIPIDEMIA  GOUT  Morbid obesity  HYPERTENSION  SINUS BRADYCARDIA  GERD  Abdominal pain, unspecified site  Diarrhea  Dehydration   CKD (chronic kidney disease) stage 2, GFR 60-89 ml/min    Time spent: 30 min    Conley Canal  [facility] Pager [phone]. If 8PM-8AM, please contact night-coverage at [email or web address], password [facility] 03/06/2012, 11:35 AM  LOS: 2 days

## 2012-03-06 NOTE — Research (Signed)
Pt in REVEAL RESEARCH STUDY-pt has study provided Lipitor and Anacetrapib vs Placebo. Dispensed through pharmacy. Zocor discontinued per protocol. Please call research with any questions [phone].

## 2012-03-06 NOTE — Progress Notes (Signed)
Patient Name: [name] Date of Encounter: 03/06/2012  Principal Problem:  *Acute renal failure Active Problems:  DIABETES MELLITUS, TYPE II  HYPERLIPIDEMIA  GOUT  Morbid obesity  HYPERTENSION  SINUS BRADYCARDIA  GERD  Abdominal pain, unspecified site  Diarrhea  Dehydration  CKD (chronic kidney disease) stage 2, GFR 60-89 ml/min    SUBJECTIVE:No chest pain, no SOB, no palpitations, presyncope, syncope. She is VERY tired of laying in the bed. Had solid food yesterday and tolerated it well.  OBJECTIVE Filed Vitals:   03/06/12 0430 03/06/12 0500 03/06/12 0600 03/06/12 0807  BP: 129/23 118/46 120/41 167/35  Pulse: 37 51 40 50  Temp:    98 F (36.7 C)  TempSrc:    Oral  Resp: 14 16 21    Height:      Weight:      SpO2: 100% 100% 100% 100%    Intake/Output Summary (Last 24 hours) at 03/06/12 7829 Last data filed at 03/06/12 0600  Gross per 24 hour  Intake   1160 ml  Output   2550 ml  Net  -1390 ml   Filed Weights   03/05/12 0222 03/06/12 0300  Weight: 219 lb 12.8 oz (99.7 kg) 219 lb 9.3 oz (99.6 kg)   PHYSICAL EXAM General: Well developed, well nourished, female in no acute distress. Head: Normocephalic, atraumatic.  Neck: Supple without bruits, JVD not elevated. Lungs:  Resp regular and unlabored, few rales bases. Heart: RRR, S1, S2, no S3, S4, 2/6 murmur LUSB. Abdomen: Soft, non-tender, non-distended, BS + x 4.  Extremities: No clubbing, cyanosis, no edema.  Neuro: Alert and oriented X 3. Moves all extremities spontaneously. Psych: Normal affect.  LABS: CBC: Basename 03/06/12 0500 03/05/12 0500 03/04/12 1955  WBC 5.8 7.8 --  NEUTROABS -- -- 5.6  HGB 9.7* 9.9* --  HCT 30.7* 31.6* --  MCV 89.5 89.0 --  PLT 106* 127* --   INR:No results found for this basename: INR in the last 72 hours Basic Metabolic Panel: Basename 03/06/12 0500 03/05/12 0500  NA 140 138  K 4.0 3.8  CL 108 102  CO2 24 24  GLUCOSE 88 122*  BUN 48* 75*  CREATININE 1.33*  2.22*  CALCIUM 8.4 8.1*  MG -- --  PHOS 3.0 --   Liver Function Tests: Basename 03/06/12 0500 03/04/12 1955  AST -- 18  ALT -- 13  ALKPHOS -- 125*  BILITOT -- 0.2*  PROT -- 6.9  ALBUMIN 2.9* 3.7    Basename 03/04/12 2003  TROPIPOC 0.01   Thyroid Function Tests: Basename 03/05/12 1224  TSH 0.870  T4FREE 0.88  T3FREE 2.1* (2.3-4.2)  THYROIDAB --   TELE: Sinus brady in the 30s, higher at times. Sometimes drops into the high 20s, worse while asleep but is in the 40s now.  No pauses since 4.61 second pause on the floor (see paper chart).    Radiology/Studies: US Renal 03/05/2012  *RADIOLOGY REPORT*  Clinical Data: Acute renal failure.  RENAL/URINARY TRACT ULTRASOUND COMPLETE  Comparison:  CT scan dated 03/04/2012  Findings:  Right Kidney:  Normal.  9.6 cm in length.  Left Kidney:  Normal.  10.9 cm in length.  Bladder:  Normal.  IMPRESSION: Normal renal ultrasound.  No evidence of obstruction or renal cortical thinning.   Original Report Authenticated By: Francene Boyers, M.D.     Current Medications:     . aspirin  81 mg Oral QPM  . clopidogrel  75 mg Oral QPM  . enoxaparin (LOVENOX) injection  30 mg Subcutaneous Q24H  . insulin aspart  0-15 Units Subcutaneous TID WC  . insulin aspart  0-5 Units Subcutaneous QHS  . insulin NPH  10 Units Subcutaneous Q supper  . insulin NPH  45 Units Subcutaneous QAC breakfast  . iron polysaccharides  150 mg Oral Daily  . levothyroxine  100 mcg Oral QPM  . pantoprazole (PROTONIX) IV  40 mg Intravenous Q24H  . simvastatin  20 mg Oral q1800  . sodium chloride  3 mL Intravenous Q12H  . [DISCONTINUED] allopurinol  100 mg Oral QPM  . [DISCONTINUED] metronidazole  500 mg Intravenous Q8H      . [EXPIRED] sodium chloride 100 mL/hr at 03/05/12 2331    ASSESSMENT AND PLAN:  SINUS BRADYCARDIA - Sinus pause last pm, 4.61 seconds, pt has underlying sinus bradycardia, worse while asleep. She had Wenkebach yesterday (in the setting of dehydration)  but have not seen any today. Will review with MD, she drives and lives alone. May need PPM, could possibly do this pm, keep NPO after breakfast.  Otherwise, continue current therapies, pt otherwise ready for d/c. IM does not feel there is any infection present. Principal Problem:  *Acute renal failure Active Problems:  DIABETES MELLITUS, TYPE II  HYPERLIPIDEMIA  GOUT  Morbid obesity  HYPERTENSION  GERD  Abdominal pain, unspecified site  Diarrhea  Dehydration  CKD (chronic kidney disease) stage 2, GFR 60-89 ml/min   Signed, Theodore Demark , PA-C 8:08 AM 03/06/2012  Patient seen, examined. Available data reviewed. Agree with findings, assessment, and plan as outlined by Theodore Demark, PA-C. Her exam is pertinent for an awake, alert woman in no distress. Her current heart rate is 35 beats per minute. There is a grade 2/6 systolic ejection murmur at the left sternal border. Her exam is otherwise unremarkable. I have reviewed her telemetry strips demonstrating marked sinus bradycardia and sinus pauses overnight. The patient is fatigued but has not had recent lightheadedness or syncope. Her electrolytes and renal function have returned to within normal limits and she remains bradycardic. She is on no AV nodal blockers. She has a past history of bradycardia and syncope and her beta blocker was stopped at least a year ago because of this. I am going to ask for an electrophysiology consultation for consideration of permanent pacemaker.  Tonny Bollman, M.D. 03/06/2012 10:56 AM

## 2012-03-06 NOTE — Progress Notes (Signed)
Patient seen and examined. Agree with note by Algis Downs, PA. Her diarrhea has resolved. Her ARF has improved significantly. Awaiting cardiology decision re: permanent pacemaker. After that, can be DC'd home.  Peggye Pitt, MD [facility] Pager: [phone]

## 2012-03-06 NOTE — Plan of Care (Signed)
RN paged this NP to inform me of pt having bradycardia with HR in the high 20s-30s, sustained, and also pauses. Cardiology fellow ordered TF to step down. Reviewed cardio note and will cont to follow.  Maren Reamer, NP [facility]

## 2012-03-06 NOTE — H&P (Signed)
See my consult note from today

## 2012-03-06 NOTE — Progress Notes (Signed)
Report called to Gunnar Fusi, Charity fundraiser.  Pt transferred in bed to 2916 with oxygen, Zoll and pacer pads on patient.  Pt declined telephone notification to family to inform of patient's transfer.

## 2012-03-06 NOTE — Progress Notes (Signed)
  Echocardiogram 2D Echocardiogram has been performed.  [name] 03/06/2012, 10:58 AM

## 2012-03-06 NOTE — Care Management Note (Signed)
    Page 1 of 1   03/06/2012     9:10:04 AM   CARE MANAGEMENT NOTE 03/06/2012  Patient:  [name],[name]   Account Number:  [id number]  Date Initiated:  03/06/2012  Documentation initiated by:  [name]  Subjective/Objective Assessment:   adm w renal failure     Action/Plan:   lives alone, pcp dr [name] bland   Anticipated DC Date:     Anticipated DC Plan:        DC Planning Services  CM consult      Choice offered to / List presented to:             Status of service:   Medicare Important Message given?   (If response is "NO", the following Medicare IM given date fields will be blank) Date Medicare IM given:   Date Additional Medicare IM given:    Discharge Disposition:    Per UR Regulation:  Reviewed for med. necessity/level of care/duration of stay  If discussed at Long Length of Stay Meetings, dates discussed:    Comments:  11/26 9am Samantha Ronny Ruddell rn,bsn [phone]

## 2012-03-06 NOTE — Op Note (Signed)
SURGEON:  Hillis Range, MD     PREPROCEDURE DIAGNOSIS:  Symptomatic Bradycardia    POSTPROCEDURE DIAGNOSIS:  Symptomatic Bradycardia     PROCEDURES:   1.  Pacemaker implantation.     INTRODUCTION: [name] is a 78 y.o. female  with a history of bradycardia who presents today for pacemaker implantation.  The patient reports intermittent episodes of dizziness and fatigue  over the past few months.  No reversible causes have been identified.  The patient therefore presents today for pacemaker implantation.     DESCRIPTION OF PROCEDURE:  Informed written consent was obtained, and the patient was brought to the electrophysiology lab in a fasting state.  The patient received IV versed and fentanyl as sedation for the procedure today.  The patients left chest was prepped and draped in the usual sterile fashion by the EP lab staff. The skin overlying the left deltopectoral region was infiltrated with lidocaine for local analgesia.  A 4-cm incision was made over the left deltopectoral region.  A left subcutaneous pacemaker pocket was fashioned using a combination of sharp and blunt dissection. Electrocautery was required to assure hemostasis.    RA/RV Lead Placement: The left axillary vein was therefore cannulated.  No contrast was required for this endeavor. Through the left axillary vein, a Medtronic model [phone] (serial number [id number]) right atrial lead and a Medtronic model [id number]- [id number] (serial number [id number]) right ventricular lead were advanced with fluoroscopic visualization into the right atrial appendage and right ventricular apex positions respectively.  Initial atrial lead P- waves measured 1 mV with impedance of 656 ohms and a threshold of 1.5 V at 0.5 msec.  Right ventricular lead R-waves measured 10 mV with an impedance of 752 ohms and a threshold of 0.5 V at 0.5 msec.  Both leads were secured to the pectoralis fascia using #2-0 silk over the suture sleeves.   Device Placement:  The  leads were then connected to a Medtronic Adapta L model ADDRL 1 (serial number [id number]) pacemaker.  The pocket was irrigated with copious gentamicin solution.  The pacemaker was then placed into the pocket.  The pocket was then closed in 2 layers with 2.0 Vicryl suture for the subcutaneous and subcuticular layers.  Steri-Strips and a sterile dressing were then applied.  There were no early apparent complications.  No contrast was required for the procedure today.    CONCLUSIONS:   1. Successful implantation of a Medtronic Adapta L dual-chamber pacemaker for symptomatic bradycardia  2. No early apparent complications.           Hillis Range, MD 03/06/2012 6:06 PM

## 2012-03-06 NOTE — Progress Notes (Signed)
Pt has had 4.61 sec pause with heart rate sustaining in 30s.  BP 139/61, pt c/o weakness.  Cardiology MD notified with new orders, Maren Reamer, NP with [facility] notified also.

## 2012-03-06 NOTE — Consult Note (Signed)
ELECTROPHYSIOLOGY CONSULT NOTE    Patient ID: [name] MRN: [id number], DOB/AGE: 06/30/1932 78 y.o.  Admit date: 03/04/2012 Date of Consult: 03-06-2012  Primary Physician: Geraldo Pitter, MD Primary Cardiologist: Tonny Bollman, MD  Reason for Consultation: symptomatic bradycardia  HPI:  [name] is a 78 year old female who was admitted with diarrhea (resolved) and bradycardia.  EP has been asked to evaluate for permanent pacemaker.  Her past medical history is significant for CAD (s/p PCI with DES 2008 and CABG 2011), hypertension, type II diabetes, bradycardia and syncope.  Her syncope was remote and was felt to be orthostatic in nature.    She denies chest pain or shortness of breath. She does report fatigue that has worsened over the last 2 months.  Recently, she has been unable to move around her house very well because of fatigue.   At time of office visit with Dr Excell Seltzer in September, HR was 57.    She is on no AV nodal agents.  TSH is normal.  Electrolytes normal.  Creatinine 1.33.  Echo 02-14-2010 demonstrated an EF of 60-65% with no RWMA, calcified mitral valve annulus.  Echo ordered but not yet done this admission.   ROS is negative except as outlined above.   Past Medical History  Diagnosis Date  . CAD (coronary artery disease)     Multivessel s/p PCI w/DES 2008 and CABG 2011  . HTN (hypertension)   . Type II or unspecified type diabetes mellitus without mention of complication, not stated as uncontrolled   . PVD (peripheral vascular disease)   . Diverticulosis of colon   . Morbid obesity   . Gout   . Depression   . Osteoarthritis   . Osteopenia   . Hyperlipidemia   . LBP (low back pain)     Lumbar disc disease/lumbar spinal stenosis  . Disc disease, degenerative, cervical   . History of thrombocytopenia   . Anxiety   . GERD (gastroesophageal reflux disease)   . Barrett esophagus   . Gastroparesis   . Helicobacter pylori gastritis   . Allergic  rhinitis   . Anemia   . Myocardial infarction   . Hypothyroidism   . H/O hiatal hernia      Surgical History:  Past Surgical History  Procedure Date  . Coronary stent placement     Drug-eluting stent to the left anterior descending, circumflex and right coronary artery in Jan 2009  . Cholecystectomy   . Abdominal hysterectomy   . Tubal ligation   . Ovarian cyst removal   . Coronary stent placement     x 3 Dr Excell Seltzer  . Coronary artery bypass graft     02/2010  . Eye surgery     BIL CATARACT REMOVAL 06/2010  . Shoulder arthroscopy 06/16/2011    Procedure: ARTHROSCOPY SHOULDER;  Surgeon: Kennieth Rad, MD;  Location: [facility] OR;  Service: Orthopedics;  Laterality: Left;  LEFT SHOULDER ARTHROSCOPY ACROMIALPLASTY, POSSIBLE MINI OPEN CUFF REPAIR   . Cardiac catheterization     2011  DR COOPER (APPT NEXT WEEK)     Prescriptions prior to admission  Medication Sig Dispense Refill  . acetaminophen (TYLENOL) 500 MG tablet Take 500 mg by mouth 2 (two) times daily as needed. For pain      . allopurinol (ZYLOPRIM) 100 MG tablet Take 100 mg by mouth every evening.       Marland Kitchen aspirin 81 MG chewable tablet Chew 81 mg by mouth every evening.       Marland Kitchen  clopidogrel (PLAVIX) 75 MG tablet Take 75 mg by mouth every evening.       . colchicine 0.6 MG tablet Take 0.6 mg by mouth daily.      . diclofenac (VOLTAREN) 75 MG EC tablet Take 75 mg by mouth 2 (two) times daily.      . furosemide (LASIX) 80 MG tablet Take 80 mg by mouth every morning.       . insulin NPH (HUMULIN N,NOVOLIN N) 100 UNIT/ML injection Inject 10-90 Units into the skin 2 (two) times daily. Morning:  90 units if cbg over 140 and 45 units if cbg below 140; evening 10 units      . iron polysaccharides (NIFEREX) 150 MG capsule Take 150 mg by mouth daily.        Marland Kitchen levothyroxine (SYNTHROID, LEVOTHROID) 100 MCG tablet Take 100 mcg by mouth every evening.       . nitroGLYCERIN (NITROSTAT) 0.4 MG SL tablet Place 0.4 mg under the tongue every 5 (five)  minutes as needed. For chest pain      . Olmesartan-Amlodipine-HCTZ (TRIBENZOR) 40-5-25 MG TABS Take 1 tablet by mouth daily.      Marland Kitchen omeprazole (PRILOSEC) 20 MG capsule Take 20 mg by mouth 2 (two) times daily.       . potassium chloride (MICRO-K) 10 MEQ CR capsule Take 10 mEq by mouth every evening.      . pravastatin (PRAVACHOL) 40 MG tablet Take 1 tablet (40 mg total) by mouth every evening.  90 tablet  3  . PRESCRIPTION MEDICATION Take 2 tablets by mouth every evening. Pt. On a study medication through [facility]ology      . sitaGLIPtan-metformin (JANUMET) 50-500 MG per tablet Take 1 tablet by mouth every evening.         Inpatient Medications:    . aspirin  81 mg Oral QPM  . clopidogrel  75 mg Oral QPM  . enoxaparin (LOVENOX) injection  40 mg Subcutaneous Q24H  . hydrALAZINE  25 mg Oral Q8H  . insulin aspart  0-15 Units Subcutaneous TID WC  . insulin aspart  0-5 Units Subcutaneous QHS  . insulin NPH  10 Units Subcutaneous Q supper  . insulin NPH  45 Units Subcutaneous QAC breakfast  . iron polysaccharides  150 mg Oral Daily  . levothyroxine  100 mcg Oral QPM  . pantoprazole  40 mg Oral Daily    Allergies:  Allergies  Allergen Reactions  . Ciprofloxacin Nausea And Vomiting    syncope  . Codeine Other (See Comments)    HALLUCINATIONS  . Diltiazem Hcl Other (See Comments)    : low heart rate  . Hydrocodone Other (See Comments)    Makes her pass out  . Lovastatin Other (See Comments)    Pt doesn't remember a reaction  . Metformin Other (See Comments)    diarrhea  . Penicillins Hives  . Shellfish Allergy Hives  . Sulfonamide Derivatives Nausea And Vomiting    History   Social History  . Marital Status: Widowed    Spouse Name: N/A    Number of Children: N/A  . Years of Education: N/A   Occupational History  . RETIRED LPN    Social History Main Topics  . Smoking status: Former Games developer  . Smokeless tobacco: Never Used     Comment: quit 30 yrs ago  . Alcohol  Use: No  . Drug Use: No  . Sexually Active: Not on file   Other Topics Concern  .  Not on file   Social History Narrative   Widowed 2004.., Lives alone..Family history is negative for premature coronary artery disease. Mother died at age 98 with heart disease in her later years, father died at age 23 from a stroke.Marland KitchenShe  has 8 siblings, none of whom have coronary artery disease.     Family History  Problem Relation Age of Onset  . Diabetes Mother   . Hypertension Mother   . Stroke Father   . Coronary artery disease Other     Physical Exam: Filed Vitals:   03/06/12 0807 03/06/12 1116 03/06/12 1149 03/06/12 1200  BP: 167/35 132/44  112/36  Pulse: 50  36 35  Temp: 98 F (36.7 C)  98.2 F (36.8 C)   TempSrc: Oral  Oral   Resp:      Height:      Weight:      SpO2: 100%  100% 98%    GEN- The patient is well appearing, alert and oriented x 3 today.   Head- normocephalic, atraumatic Eyes-  Sclera clear, conjunctiva pink Ears- hearing intact Oropharynx- clear Neck- supple, no JVP Lymph- no cervical lymphadenopathy Lungs- Clear to ausculation bilaterally, normal work of breathing Heart- bradycardic regular rhythm GI- soft, NT, ND, + BS Extremities- no clubbing, cyanosis, or edema MS- no significant deformity or atrophy Skin- no rash or lesion Psych- euthymic mood, full affect Neuro- strength and sensation are intact  Labs:   Lab Results  Component Value Date   WBC 5.8 03/06/2012   HGB 9.7* 03/06/2012   HCT 30.7* 03/06/2012   MCV 89.5 03/06/2012   PLT 106* 03/06/2012    Lab 03/06/12 0500 03/04/12 1955  NA 140 --  K 4.0 --  CL 108 --  CO2 24 --  BUN 48* --  CREATININE 1.33* --  CALCIUM 8.4 --  PROT -- 6.9  BILITOT -- 0.2*  ALKPHOS -- 125*  ALT -- 13  AST -- 18  GLUCOSE 88 --     Radiology/Studies: CXR 06-2011- no active disease  EKG: sinus bradycardia, rate 40's, narrow QRS  TELEMETRY: sinus brady, rates 30-40's  A/P The patient has symptomatic  bradycardia.   No reversible causes are present.  I would therefore recommend pacemaker implantation at this time.  Risks, benefits, alternatives to pacemaker implantation were discussed in detail with the patient today. The patient understands that the risks include but are not limited to bleeding, infection, pneumothorax, perforation, tamponade, vascular damage, renal failure, MI, stroke, death,  and lead dislodgement and wishes to proceed. We will therefore schedule the procedure at the next available time.   Fayrene Fearing Jaxton Casale,MD

## 2012-03-07 ENCOUNTER — Inpatient Hospital Stay (HOSPITAL_COMMUNITY): Payer: Medicare Other

## 2012-03-07 DIAGNOSIS — I5033 Acute on chronic diastolic (congestive) heart failure: Secondary | ICD-10-CM

## 2012-03-07 DIAGNOSIS — I5031 Acute diastolic (congestive) heart failure: Secondary | ICD-10-CM

## 2012-03-07 DIAGNOSIS — E860 Dehydration: Secondary | ICD-10-CM

## 2012-03-07 DIAGNOSIS — M549 Dorsalgia, unspecified: Secondary | ICD-10-CM

## 2012-03-07 LAB — BASIC METABOLIC PANEL
Chloride: 109 mEq/L (ref 96–112)
Creatinine, Ser: 0.94 mg/dL (ref 0.50–1.10)
GFR calc Af Amer: 66 mL/min — ABNORMAL LOW (ref >90)
GFR calc non Af Amer: 57 mL/min — ABNORMAL LOW (ref >90)
Potassium: 4 mEq/L (ref 3.5–5.1)

## 2012-03-07 LAB — CBC
Platelets: 98 10*3/uL — ABNORMAL LOW (ref 150–400)
RDW: 13.4 % (ref 11.5–15.5)
WBC: 5.8 10*3/uL (ref 4.0–10.5)

## 2012-03-07 LAB — GLUCOSE, CAPILLARY
Glucose-Capillary: 114 mg/dL — ABNORMAL HIGH (ref 70–99)
Glucose-Capillary: 180 mg/dL — ABNORMAL HIGH (ref 70–99)

## 2012-03-07 MED ORDER — CHLORTHALIDONE 25 MG PO TABS
25.0000 mg | ORAL_TABLET | Freq: Every day | ORAL | Status: DC
  Administered 2012-03-07 – 2012-03-08 (×2): 25 mg via ORAL
  Filled 2012-03-07 (×2): qty 1

## 2012-03-07 MED ORDER — HYDRALAZINE HCL 20 MG/ML IJ SOLN
10.0000 mg | Freq: Four times a day (QID) | INTRAMUSCULAR | Status: DC | PRN
  Administered 2012-03-07: 10 mg via INTRAVENOUS
  Filled 2012-03-07: qty 0.5

## 2012-03-07 NOTE — Progress Notes (Signed)
[facility] PROGRESS NOTE  [name] ZOX:[id number] DOB: 1933-03-24 DOA: 03/04/2012 PCP: Samantha Pitter, MD  Assessment/Plan: Diarrhea - Resolved.  Possibly infectious etiology, including a viral gastroenteritis vs medication related (colchicine).  CT of the abdomen and pelvis was unremarkable.  Continue to hold colchicine at this time.  Tolerating Carb modified diet Antiemetics for nausea.  BM today 03/07/12 with no diarrhea reported.  Acute renal failure - resolved Dehydration in combination with Medications  ACE inhibitors, nonsteroidal anti-inflammatories, diuretics on hold  Renal ultrasound reassuring.  Diabetes.  Hold oral hypoglycemics and use sliding scale insulin.  Once she is no longer NPO we will be able to resume her PTA insulin (70/30)  Hypertension.  As diarrhea has resolved, blood pressure has improved and now HTN is back.  Patient required hydralazine IV for elevated blood pressures. Also on hydralazine orally. Will place on chlorthalidone Avoid BB, and be judicious with CCB. Diuretics  Sinus bradycardia.  Pt had pacemaker placed with no complications reported. She's not on any rate lowering medications. Hx of CABG in 2011 and Cath in 11/2010  Echo completed. monitor on telemetry.  Gout.  Pain in left foot 1 week ago. No pain now.  discontinue allopurinol. She does not have an acute flare.  Hyperlipidemia.  Patient is on a study medication through [facility].  The patient has been on this medication for many years, and as it is unlikely to contribute to her present complaints. She has been asked to bring this in from home, so she may continue it in the hospital.  Code Status: DNR  Family Communication:  Disposition Plan: Once blood pressure better controlled will consider discharge.  Consultants:  [facility], (Regular patient of Dr. Excell Seltzer) Procedures:  Echo  Antibiotics:  None  HPI/Subjective: No new complaints.  Patient  wishing to go home.  But had elevated blood pressures.  Reportedly she was upset with family member today.  No new complaints and denies any chest pain, abdominal pain, blurred vision, or headaches.  Objective: Filed Vitals:   03/06/12 2207 03/07/12 0517 03/07/12 1432 03/07/12 1525  BP: 156/65 184/65 236/70 175/55  Pulse: 60 60 60   Temp:  98.1 F (36.7 C) 98.4 F (36.9 C)   TempSrc:  Oral Oral   Resp:  20 18   Height:      Weight:      SpO2:  97% 100%     Intake/Output Summary (Last 24 hours) at 03/07/12 1634 Last data filed at 03/07/12 0730  Gross per 24 hour  Intake    240 ml  Output   1650 ml  Net  -1410 ml   Filed Weights   03/05/12 0222 03/06/12 0300  Weight: 99.7 kg (219 lb 12.8 oz) 99.6 kg (219 lb 9.3 oz)    Exam:   General:  Pt in NAD, A and O x 3  Cardiovascular: RRR, no rubs   Respiratory: CTA BL, no wheezes or increased WOB  Abdomen: soft, NT, ND  Data Reviewed: Basic Metabolic Panel:  Lab 03/07/12 8119 03/06/12 0500 03/05/12 0500 03/04/12 1955  NA 139 140 138 140  K 4.0 4.0 3.8 5.1  CL 109 108 102 102  CO2 23 24 24 26   GLUCOSE 169* 88 122* 129*  BUN 24* 48* 75* 78*  CREATININE 0.94 1.33* 2.22* 2.08*  CALCIUM 8.9 8.4 8.1* 9.2  MG -- -- -- --  PHOS -- 3.0 -- --   Liver Function Tests:  Lab 03/06/12 0500 03/04/12 1955  AST -- 18  ALT -- 13  ALKPHOS -- 125*  BILITOT -- 0.2*  PROT -- 6.9  ALBUMIN 2.9* 3.7    Lab 03/04/12 1955  LIPASE 78*  AMYLASE --   No results found for this basename: AMMONIA:5 in the last 168 hours CBC:  Lab 03/07/12 0506 03/06/12 0500 03/05/12 0500 03/04/12 1955  WBC 5.8 5.8 7.8 8.2  NEUTROABS -- -- -- 5.6  HGB 10.2* 9.7* 9.9* 11.8*  HCT 31.8* 30.7* 31.6* 37.2  MCV 89.8 89.5 89.0 88.8  PLT 98* 106* 127* 158   Cardiac Enzymes: No results found for this basename: CKTOTAL:5,CKMB:5,CKMBINDEX:5,TROPONINI:5 in the last 168 hours BNP (last 3 results) No results found for this basename: PROBNP:3 in the last 8760  hours CBG:  Lab 03/07/12 1117 03/07/12 0620 03/06/12 2048 03/06/12 1152 03/06/12 0819  GLUCAP 114* 150* 100* 175* 86    Recent Results (from the past 240 hour(s))  MRSA PCR SCREENING     Status: Normal   Collection Time   03/06/12  2:57 AM      Component Value Range Status Comment   MRSA by PCR NEGATIVE  NEGATIVE Final      Studies: Dg Chest 2 View  03/07/2012  *RADIOLOGY REPORT*  Clinical Data: Status post pacemaker placement  CHEST - 2 VIEW  Comparison: 06/10/2011  Findings: Left subclavian dual chamber pacemaker is in place with appropriate positioning of leads.  No evidence of pneumothorax, edema or pleural fluid after the procedure.  The heart size is stable.  IMPRESSION: No complications evident after pacemaker placement.   Original Report Authenticated By: Irish Lack, M.D.     Scheduled Meds:   . aspirin  81 mg Oral QPM  . [EXPIRED] chlorhexidine      . clopidogrel  75 mg Oral QPM  . [COMPLETED] heparin      . hydrALAZINE  25 mg Oral Q8H  . insulin aspart  0-15 Units Subcutaneous TID WC  . insulin aspart  0-5 Units Subcutaneous QHS  . insulin NPH  10 Units Subcutaneous Q supper  . insulin NPH  45 Units Subcutaneous QAC breakfast  . iron polysaccharides  150 mg Oral Daily  . levothyroxine  100 mcg Oral QPM  . [COMPLETED] lidocaine      . [COMPLETED] midazolam      . [COMPLETED] midazolam      . pantoprazole  40 mg Oral Daily  . sodium chloride  3 mL Intravenous Q12H  . [COMPLETED] vancomycin  1,000 mg Intravenous Q12H  . [DISCONTINUED] chlorhexidine  60 mL Topical Once  . [DISCONTINUED] chlorhexidine  60 mL Topical Once  . [DISCONTINUED] enoxaparin (LOVENOX) injection  40 mg Subcutaneous Q24H  . [DISCONTINUED] gentamicin irrigation  80 mg Irrigation On Call  . [DISCONTINUED] sodium chloride  3 mL Intravenous Q12H  . [DISCONTINUED] vancomycin  1,000 mg Intravenous On Call   Continuous Infusions:   . [DISCONTINUED] sodium chloride 50 mL/hr (03/06/12 1559)  .  [DISCONTINUED] sodium chloride 100 mL/hr at 03/05/12 2331  . [DISCONTINUED] sodium chloride 50 mL/hr at 03/06/12 1119    Principal Problem:  *Acute renal failure Active Problems:  DIABETES MELLITUS, TYPE II  HYPERLIPIDEMIA  GOUT  Morbid obesity  HYPERTENSION  SINUS BRADYCARDIA  GERD  Abdominal pain, unspecified site  Diarrhea  Dehydration  CKD (chronic kidney disease) stage 2, GFR 60-89 ml/min    Time spent: > 35 minutes    [name]  [facility] Pager [phone]. If 8PM-8AM, please contact night-coverage at [email or web address],  password [facility] 03/07/2012, 4:34 PM  LOS: 3 days

## 2012-03-07 NOTE — Progress Notes (Signed)
   ELECTROPHYSIOLOGY ROUNDING NOTE    Patient Name: [name] Date of Encounter: 03-07-2012    SUBJECTIVE:Patient feels well.  No chest pain or shortness of breath.  Minimal incisional soreness.  S/p PPM implant 03-06-2012 for symptomatic bradycardia.   TELEMETRY: Reviewed telemetry pt in atrial pacing with occasional ventricular pacing (normal MVP device function) Physical Exam: Filed Vitals:   03/06/12 1625 03/06/12 1900 03/06/12 2207 03/07/12 0517  BP:  180/82 156/65 184/65  Pulse: 48 60 60 60  Temp:  97.9 F (36.6 C)  98.1 F (36.7 C)  TempSrc:    Oral  Resp:    20  Height:      Weight:      SpO2:  97%  97%    GEN- The patient is well appearing, alert and oriented x 3 today.   Head- normocephalic, atraumatic Eyes-  Sclera clear, conjunctiva pink Ears- hearing intact Oropharynx- clear Neck- supple, no JVP Lymph- no cervical lymphadenopathy Lungs- Clear to ausculation bilaterally, normal work of breathing Chest- pacemaker pocket is without hematoma Heart- Regular rate and rhythm  GI- soft, NT, ND, + BS Extremities- no clubbing, cyanosis, or edema  Pacemaker interrogation- reviewed in detail today,  See paper chart  LABS: Basic Metabolic Panel:  Basename 03/07/12 0506 03/06/12 0500  NA 139 140  K 4.0 4.0  CL 109 108  CO2 23 24  GLUCOSE 169* 88  BUN 24* 48*  CREATININE 0.94 1.33*  CALCIUM 8.9 8.4  MG -- --  PHOS -- 3.0   Liver Function Tests:  Basename 03/06/12 0500 03/04/12 1955  AST -- 18  ALT -- 13  ALKPHOS -- 125*  BILITOT -- 0.2*  PROT -- 6.9  ALBUMIN 2.9* 3.7    Basename 03/04/12 1955  LIPASE 78*  AMYLASE --   CBC:  Basename 03/07/12 0506 03/06/12 0500 03/04/12 1955  WBC 5.8 5.8 --  NEUTROABS -- -- 5.6  HGB 10.2* 9.7* --  HCT 31.8* 30.7* --  MCV 89.8 89.5 --  PLT 98* 106* --   Thyroid Function Tests:  Basename 03/05/12 1224  TSH 0.870  T4TOTAL --  T3FREE 2.1*  THYROIDAB --    Radiology/Studies:  CXr reveals no ptx,  leads in stable position.  DEVICE INTERROGATION: Device interrogated by industry.  Lead values including impedence, sensing, threshold within normal values.     Assessment and Plan:  1. Symptomatic bradycardia Doing well s/p PPM  Wound care, arm mobility, restrictions reviewed with patient  Routine device follow up scheduled.  OK to discharge from EP standpoint Will see as needed while here Please call with questions

## 2012-03-08 DIAGNOSIS — F329 Major depressive disorder, single episode, unspecified: Secondary | ICD-10-CM

## 2012-03-08 LAB — GLUCOSE, CAPILLARY: Glucose-Capillary: 114 mg/dL — ABNORMAL HIGH (ref 70–99)

## 2012-03-08 MED ORDER — INSULIN NPH (HUMAN) (ISOPHANE) 100 UNIT/ML ~~LOC~~ SUSP: 45.0000 [IU] | Freq: Every day | SUBCUTANEOUS | Status: DC

## 2012-03-08 MED ORDER — HYDRALAZINE HCL 25 MG PO TABS: 25.0000 mg | ORAL_TABLET | Freq: Three times a day (TID) | ORAL | Status: DC

## 2012-03-08 MED ORDER — CHLORTHALIDONE 25 MG PO TABS: 25.0000 mg | ORAL_TABLET | Freq: Every day | ORAL | Status: DC

## 2012-03-08 NOTE — Discharge Summary (Signed)
Physician Discharge Summary  [name] BJY:[id number] DOB: May 08, 1932 DOA: 03/04/2012  PCP: Geraldo Pitter, MD  Admit date: 03/04/2012 Discharge date: 03/08/2012  Time spent: 35 minutes  Recommendations for Outpatient Follow-up:  1. Please be sure to follow up with blood pressures and adjust medications pending f/u blood pressures 2. Also check creatinine and blood sugar logs given patient's change in medications.  Creatinine on discharge 0.94 and as such placed back on her metformin medication.  Discharge Diagnoses:  Principal Problem:  *Acute renal failure Active Problems:  DIABETES MELLITUS, TYPE II  HYPERLIPIDEMIA  GOUT  Morbid obesity  HYPERTENSION  SINUS BRADYCARDIA  GERD  Abdominal pain, unspecified site  Diarrhea  Dehydration  CKD (chronic kidney disease) stage 2, GFR 60-89 ml/min   Discharge Condition: Stable  Diet recommendation: Carb modified diet  Filed Weights   03/05/12 0222 03/06/12 0300  Weight: 99.7 kg (219 lb 12.8 oz) 99.6 kg (219 lb 9.3 oz)    History of present illness:  From original [name] is a 78 y.o. female with multiple medical problems who presents to the emergency room with complaints of diarrhea, abdominal pain, generalized weakness. Patient denies any fever, she is diffuse abdominal pain which he describes as crampy. It is associated with diarrhea she is having 4-5 bowel movements a day. She describes this as mucous, she denies any hematochezia. She's felt very nauseous but not had any vomiting. She is able to keep down liquids but is having trouble with solids. She has not had any sick contacts or travel history, she's not eaten at any restaurants recently. She's not been on antibiotics recently. She was recently started on colchicine for gout last week. She is on multiple nephrotoxic medications including ARB, nonsteroidal anti-inflammatories, she is also on Lasix. She presents to the emergency room where she was having acute  renal failure with creatinine of 2.0. She feels generally weak. She was referred for admission for further workup.   Hospital Course:  Diarrhea - Resolved.  Possibly infectious etiology, including a viral gastroenteritis vs medication related (colchicine).  CT of the abdomen and pelvis was unremarkable.  Continue to hold colchicine at discharge given patient's improvement off this medication.    Will continue to recommend diabetic diet as outpatient. Antiemetics for nausea.  BM today 03/07/12 with no diarrhea reported.  Acute renal failure - resolved  Dehydration in combination with Medications  ACE inhibitors, nonsteroidal anti-inflammatories, diuretics on hold  Renal ultrasound reassuring.  Diabetes.  Place back on her oral hypoglycemic medication given improvement in creatinine off of nephrotoxic agents. Will continue patient's novolog 45 Units [place] qac breakfast.  Recommend patient continue to monitor her blood sugars and report her blood sugars to her PCP  Hypertension.  Improved on hydralazine orally and on chlorthalidone.  Will have patient follow up with primary care physician for further evaluation and recommendations. Avoid BB, and be judicious with CCB. Hold diuretics at discharge given that her creatinine was elevated initially due to prerenal causes and improved off of her lasix.  Sinus bradycardia.  Pt had pacemaker placed with no complications reported.  She's not on any rate lowering medications. Hx of CABG in 2011 and Cath in 11/2010  Echo completed.  monitor on telemetry.  Gout.  Pain in left foot 1 week ago. No pain now.  discontinue allopurinol. She does not have an acute flare.  Hyperlipidemia.  Patient is on a study medication through [facility] cardiology.  The patient has been on this medication  for many years, and as it is unlikely to contribute to her present complaints. She has been asked to bring this in from home, she continued this medication while in  house.   Procedures:  Echocardiogram  Consultations:  [facility]: Dr. Johney Frame  Discharge Exam: Filed Vitals:   03/07/12 1525 03/07/12 2151 03/07/12 2300 03/08/12 0701  BP: 175/55 171/47 148/57 160/63  Pulse:  59 60 59  Temp:  99 F (37.2 C)  97.3 F (36.3 C)  TempSrc:  Oral  Oral  Resp:  17  19  Height:      Weight:      SpO2:  100%  100%    General: Pt in NAD, Alert and Awake Cardiovascular: S1 and S2 normal, no murmurs Respiratory: CTA BL, no wheezes, no increased work of breathing.  Discharge Instructions  Discharge Orders    Future Appointments: Provider: Department: Dept Phone: Center:   03/15/2012 3:30 PM [facility] Main Office [place]) [phone] [facility]   03/20/2012 10:00 AM [facility] [place] [facility] [phone] None   06/13/2012 10:00 AM Hillis Range, MD [facility] Main Office [place]) [phone] [facility]     Future Orders Please Complete By Expires   Diet - low sodium heart healthy      Increase activity slowly      Discharge instructions      Comments:   Please be sure to follow up with your primary care physician.  Also continue to log your blood sugars and present them to your primary care physician once you transition home.  Also be sure to follow up with your cardiologist given your recent diagnosis.  Please be sure to call their office to set up appointment in 2-3 weeks or sooner should any new concerns arise.       Medication List     As of 03/08/2012 11:54 AM    STOP taking these medications         allopurinol 100 MG tablet   Commonly known as: ZYLOPRIM      colchicine 0.6 MG tablet      diclofenac 75 MG EC tablet   Commonly known as: VOLTAREN      furosemide 80 MG tablet   Commonly known as: LASIX      potassium chloride 10 MEQ CR capsule   Commonly known as: MICRO-K      TRIBENZOR 40-5-25 MG Tabs   Generic drug:  Olmesartan-Amlodipine-HCTZ      TAKE these medications         acetaminophen 500 MG tablet   Commonly known as: TYLENOL   Take 500 mg by mouth 2 (two) times daily as needed. For pain      aspirin 81 MG chewable tablet   Chew 81 mg by mouth every evening.      chlorthalidone 25 MG tablet   Commonly known as: HYGROTON   Take 1 tablet (25 mg total) by mouth daily.      clopidogrel 75 MG tablet   Commonly known as: PLAVIX   Take 75 mg by mouth every evening.      hydrALAZINE 25 MG tablet   Commonly known as: APRESOLINE   Take 1 tablet (25 mg total) by mouth every 8 (eight) hours.      insulin NPH 100 UNIT/ML injection   Commonly known as: HUMULIN N,NOVOLIN N   Inject 45 Units into the skin daily before breakfast.      iron polysaccharides 150  MG capsule   Commonly known as: NIFEREX   Take 150 mg by mouth daily.      levothyroxine 100 MCG tablet   Commonly known as: SYNTHROID, LEVOTHROID   Take 100 mcg by mouth every evening.      nitroGLYCERIN 0.4 MG SL tablet   Commonly known as: NITROSTAT   Place 0.4 mg under the tongue every 5 (five) minutes as needed. For chest pain      omeprazole 20 MG capsule   Commonly known as: PRILOSEC   Take 20 mg by mouth 2 (two) times daily.      pravastatin 40 MG tablet   Commonly known as: PRAVACHOL   Take 1 tablet (40 mg total) by mouth every evening.      PRESCRIPTION MEDICATION   Take 2 tablets by mouth every evening. Pt. On a study medication through [facility] cardiology      sitaGLIPtan-metformin 50-500 MG per tablet   Commonly known as: JANUMET   Take 1 tablet by mouth every evening.           Follow-up Information    Follow up with Hillis Range, MD. On 06/13/2012. (10 AM)    Contact information:   [address], [address] [place] [address] [phone]       Follow up with [facility]. On 03/15/2012. (3:30 PM)    Contact information:   [address], [address] [place] [address] [phone]            The results of significant diagnostics from this hospitalization (including imaging, microbiology, ancillary and laboratory) are listed below for reference.    Significant Diagnostic Studies: Ct Abdomen Pelvis Wo Contrast  03/04/2012  *RADIOLOGY REPORT*  Clinical Data: Abdominal pain; hypertension.  Nausea and diarrhea.  CT ABDOMEN AND PELVIS WITHOUT CONTRAST  Technique:  Multidetector CT imaging of the abdomen and pelvis was performed following the standard protocol without intravenous contrast.  Comparison: CT of the abdomen and pelvis performed 07/22/2004  Findings: The visualized lung bases are clear.  Diffuse coronary artery calcifications are seen.  The patient is status post median sternotomy.  The liver and spleen are unremarkable in appearance.  The patient is status post cholecystectomy.  The pancreas and adrenal glands are unremarkable.  The kidneys are unremarkable in appearance.  There is no evidence of hydronephrosis.  No renal or ureteral stones are seen.  No perinephric stranding is appreciated.  No free fluid is identified.  The small bowel is unremarkable in appearance.  The stomach is within normal limits.  No acute vascular abnormalities are seen.  Scattered calcification is noted along the abdominal aorta and its branches.  The appendix is not definitely seen; there is no evidence for appendicitis.  The colon is largely decompressed.  Scattered diverticulosis is noted along the distal descending and proximal sigmoid colon, without evidence of diverticulitis.  The bladder is mildly distended and grossly unremarkable in appearance.  The patient is status post hysterectomy; no suspicious adnexal masses are seen.  No inguinal lymphadenopathy is seen.  No acute osseous abnormalities are identified.  Multilevel vacuum phenomenon and disc space narrowing noted along the lower thoracic and lumbar spine.  IMPRESSION:  1.  No acute abnormalities seen within the abdomen or pelvis. 2.   Scattered diverticulosis along the distal descending and proximal sigmoid colon, without evidence of diverticulitis. 3.  Scattered calcification along the abdominal aorta and its branches. 4.  Diffuse coronary artery calcifications seen. 5.  Mild  diffuse degenerative change along the lower thoracic and lumbar spine.   Original Report Authenticated By: Tonia Ghent, M.D.    Dg Chest 2 View  03/07/2012  *RADIOLOGY REPORT*  Clinical Data: Status post pacemaker placement  CHEST - 2 VIEW  Comparison: 06/10/2011  Findings: Left subclavian dual chamber pacemaker is in place with appropriate positioning of leads.  No evidence of pneumothorax, edema or pleural fluid after the procedure.  The heart size is stable.  IMPRESSION: No complications evident after pacemaker placement.   Original Report Authenticated By: Irish Lack, M.D.    US Renal  03/05/2012  *RADIOLOGY REPORT*  Clinical Data: Acute renal failure.  RENAL/URINARY TRACT ULTRASOUND COMPLETE  Comparison:  CT scan dated 03/04/2012  Findings:  Right Kidney:  Normal.  9.6 cm in length.  Left Kidney:  Normal.  10.9 cm in length.  Bladder:  Normal.  IMPRESSION: Normal renal ultrasound.  No evidence of obstruction or renal cortical thinning.   Original Report Authenticated By: Francene Boyers, M.D.     Microbiology: Recent Results (from the past 240 hour(s))  MRSA PCR SCREENING     Status: Normal   Collection Time   03/06/12  2:57 AM      Component Value Range Status Comment   MRSA by PCR NEGATIVE  NEGATIVE Final      Labs: Basic Metabolic Panel:  Lab 03/07/12 1308 03/06/12 0500 03/05/12 0500 03/04/12 1955  NA 139 140 138 140  K 4.0 4.0 3.8 5.1  CL 109 108 102 102  CO2 23 24 24 26   GLUCOSE 169* 88 122* 129*  BUN 24* 48* 75* 78*  CREATININE 0.94 1.33* 2.22* 2.08*  CALCIUM 8.9 8.4 8.1* 9.2  MG -- -- -- --  PHOS -- 3.0 -- --   Liver Function Tests:  Lab 03/06/12 0500 03/04/12 1955  AST -- 18  ALT -- 13  ALKPHOS -- 125*  BILITOT --  0.2*  PROT -- 6.9  ALBUMIN 2.9* 3.7    Lab 03/04/12 1955  LIPASE 78*  AMYLASE --   No results found for this basename: AMMONIA:5 in the last 168 hours CBC:  Lab 03/07/12 0506 03/06/12 0500 03/05/12 0500 03/04/12 1955  WBC 5.8 5.8 7.8 8.2  NEUTROABS -- -- -- 5.6  HGB 10.2* 9.7* 9.9* 11.8*  HCT 31.8* 30.7* 31.6* 37.2  MCV 89.8 89.5 89.0 88.8  PLT 98* 106* 127* 158   Cardiac Enzymes: No results found for this basename: CKTOTAL:5,CKMB:5,CKMBINDEX:5,TROPONINI:5 in the last 168 hours BNP: BNP (last 3 results) No results found for this basename: PROBNP:3 in the last 8760 hours CBG:  Lab 03/08/12 0702 03/07/12 2153 03/07/12 1631 03/07/12 1117 03/07/12 0620  GLUCAP 114* 180* 127* 114* 150*       Signed:  Penny Pia  [facility] 03/08/2012, 11:54 AM

## 2012-03-13 ENCOUNTER — Other Ambulatory Visit: Payer: Self-pay | Admitting: Neurology

## 2012-03-13 MED ORDER — LEVOTHYROXINE SODIUM 100 MCG PO TABS: 100.0000 ug | ORAL_TABLET | Freq: Every day | ORAL | Status: DC

## 2012-03-15 ENCOUNTER — Ambulatory Visit (INDEPENDENT_AMBULATORY_CARE_PROVIDER_SITE_OTHER): Payer: Medicare Other | Admitting: *Deleted

## 2012-03-15 ENCOUNTER — Other Ambulatory Visit: Payer: Self-pay | Admitting: *Deleted

## 2012-03-15 ENCOUNTER — Encounter: Payer: Self-pay | Admitting: Internal Medicine

## 2012-03-15 DIAGNOSIS — I495 Sick sinus syndrome: Secondary | ICD-10-CM

## 2012-03-15 LAB — PACEMAKER DEVICE OBSERVATION
AL AMPLITUDE: 4 mv
AL IMPEDENCE PM: 493 Ohm
AL THRESHOLD: 0.5 V
RV LEAD AMPLITUDE: 5.6 mv
RV LEAD IMPEDENCE PM: 572 Ohm

## 2012-03-15 MED ORDER — CLOPIDOGREL BISULFATE 75 MG PO TABS: 75.0000 mg | ORAL_TABLET | Freq: Every evening | ORAL | Status: DC

## 2012-03-15 NOTE — Progress Notes (Signed)
Pacer check in clinic  

## 2012-03-22 ENCOUNTER — Other Ambulatory Visit: Payer: Self-pay | Admitting: *Deleted

## 2012-03-22 MED ORDER — CLOPIDOGREL BISULFATE 75 MG PO TABS: 75.0000 mg | ORAL_TABLET | Freq: Every evening | ORAL | Status: DC

## 2012-03-29 ENCOUNTER — Other Ambulatory Visit: Payer: Self-pay | Admitting: *Deleted

## 2012-03-29 DIAGNOSIS — E7800 Pure hypercholesterolemia, unspecified: Secondary | ICD-10-CM

## 2012-04-27 ENCOUNTER — Ambulatory Visit (INDEPENDENT_AMBULATORY_CARE_PROVIDER_SITE_OTHER): Payer: Medicare Other | Admitting: Endocrinology

## 2012-04-27 ENCOUNTER — Encounter: Payer: Self-pay | Admitting: Endocrinology

## 2012-04-27 VITALS — BP 136/80 | HR 87 | Temp 97.5°F | Wt 218.0 lb

## 2012-04-27 DIAGNOSIS — E1029 Type 1 diabetes mellitus with other diabetic kidney complication: Secondary | ICD-10-CM | POA: Insufficient documentation

## 2012-04-27 DIAGNOSIS — E119 Type 2 diabetes mellitus without complications: Secondary | ICD-10-CM

## 2012-04-27 NOTE — Progress Notes (Signed)
Subjective:    Patient ID: [name], female    DOB: 12-08-1932, 79 y.o.   MRN: [id number]  HPI Pt returns for f/u of insulin-requiring DM (dx'ed 1988; complicated by PAD, renal insufficiency, CAD, and gastroparesis).  no cbg record, but states cbg's vary from 65-162.  There is no trend throughout the day. pt states she feels well in general.  She takes NPH insulin 45 units QAM,and 10 units QPM. Past Medical History  Diagnosis Date  . CAD (coronary artery disease)     Multivessel s/p PCI w/DES 2008 and CABG 2011  . HTN (hypertension)   . Type II or unspecified type diabetes mellitus without mention of complication, not stated as uncontrolled   . PVD (peripheral vascular disease)   . Diverticulosis of colon   . Morbid obesity   . Gout   . Depression   . Osteoarthritis   . Osteopenia   . Hyperlipidemia   . LBP (low back pain)     Lumbar disc disease/lumbar spinal stenosis  . Disc disease, degenerative, cervical   . History of thrombocytopenia   . Anxiety   . GERD (gastroesophageal reflux disease)   . Barrett esophagus   . Gastroparesis   . Helicobacter pylori gastritis   . Allergic rhinitis   . Anemia   . Myocardial infarction   . Hypothyroidism   . H/O hiatal hernia     Past Surgical History  Procedure Date  . Coronary stent placement     Drug-eluting stent to the left anterior descending, circumflex and right coronary artery in Jan 2009  . Cholecystectomy   . Abdominal hysterectomy   . Tubal ligation   . Ovarian cyst removal   . Coronary stent placement     x 3 Dr Excell Seltzer  . Coronary artery bypass graft     02/2010  . Eye surgery     BIL CATARACT REMOVAL 06/2010  . Shoulder arthroscopy 06/16/2011    Procedure: ARTHROSCOPY SHOULDER;  Surgeon: Kennieth Rad, MD;  Location: [facility] OR;  Service: Orthopedics;  Laterality: Left;  LEFT SHOULDER ARTHROSCOPY ACROMIALPLASTY, POSSIBLE MINI OPEN CUFF REPAIR   . Cardiac catheterization     2011  DR COOPER (APPT NEXT WEEK)     History   Social History  . Marital Status: Widowed    Spouse Name: N/A    Number of Children: N/A  . Years of Education: N/A   Occupational History  . RETIRED LPN    Social History Main Topics  . Smoking status: Former Games developer  . Smokeless tobacco: Never Used     Comment: quit 30 yrs ago  . Alcohol Use: No  . Drug Use: No  . Sexually Active: Not on file   Other Topics Concern  . Not on file   Social History Narrative   Widowed 2004.., Lives alone..Family history is negative for premature coronary artery disease. Mother died at age 80 with heart disease in her later years, father died at age 19 from a stroke.Marland KitchenShe  has 8 siblings, none of whom have coronary artery disease.    Current Outpatient Prescriptions on File Prior to Visit  Medication Sig Dispense Refill  . acetaminophen (TYLENOL) 500 MG tablet Take 500 mg by mouth 2 (two) times daily as needed. For pain      . aspirin 81 MG chewable tablet Chew 81 mg by mouth every evening.       . chlorthalidone (HYGROTON) 25 MG tablet Take 1 tablet (25 mg total) by  mouth daily.  30 tablet  0  . clopidogrel (PLAVIX) 75 MG tablet Take 1 tablet (75 mg total) by mouth every evening.  30 tablet  6  . hydrALAZINE (APRESOLINE) 25 MG tablet Take 1 tablet (25 mg total) by mouth every 8 (eight) hours.  42 tablet  0  . iron polysaccharides (NIFEREX) 150 MG capsule Take 150 mg by mouth daily.        Marland Kitchen levothyroxine (SYNTHROID, LEVOTHROID) 100 MCG tablet Take 1 tablet (100 mcg total) by mouth daily.  30 tablet  0  . nitroGLYCERIN (NITROSTAT) 0.4 MG SL tablet Place 0.4 mg under the tongue every 5 (five) minutes as needed. For chest pain      . omeprazole (PRILOSEC) 20 MG capsule Take 20 mg by mouth 2 (two) times daily.       . pravastatin (PRAVACHOL) 40 MG tablet Take 1 tablet (40 mg total) by mouth every evening.  90 tablet  3  . PRESCRIPTION MEDICATION Take 2 tablets by mouth every evening. Pt. On a study medication through [facility] cardiology       . [DISCONTINUED] atorvastatin (LIPITOR) 20 MG tablet Take 20 mg by mouth daily.        Allergies  Allergen Reactions  . Ciprofloxacin Nausea And Vomiting    syncope  . Codeine Other (See Comments)    HALLUCINATIONS  . Diltiazem Hcl Other (See Comments)    : low heart rate  . Hydrocodone Other (See Comments)    Makes her pass out  . Lovastatin Other (See Comments)    Pt doesn't remember a reaction  . Metformin Other (See Comments)    diarrhea  . Penicillins Hives  . Shellfish Allergy Hives  . Sulfonamide Derivatives Nausea And Vomiting    Family History  Problem Relation Age of Onset  . Diabetes Mother   . Hypertension Mother   . Stroke Father   . Coronary artery disease Other     BP 136/80  Pulse 87  Temp 97.5 F (36.4 C) (Oral)  Wt 218 lb (98.884 kg)  SpO2 97%  Review of Systems denies hypoglycemia.      Objective:   Physical Exam VITAL SIGNS:  See vs page GENERAL: no distress Pulses: dorsalis pedis intact bilat.   Feet: no deformity, except that the left 2nd toe overlaps the 3rd.  no ulcer on the feet.  feet are of normal color and temp.  no edema.  There is bilateral onychomycosis.   Neuro: sensation is intact to touch on the feet.       Assessment & Plan:  DM: therapy limited by noncompliance with f/u.  i'll do the best i can.

## 2012-04-27 NOTE — Patient Instructions (Addendum)
check your blood sugar 2 times a day.  vary the time of day when you check, between before the 3 meals, and at bedtime.  also check if you have symptoms of your blood sugar being too high or too low.  please keep a record of the readings and bring it to your next appointment here.  please call us sooner if your blood sugar goes below 70, or if it stays over 200.   Please come back for a follow-up appointment in 3 months. blood tests are being requested for you today.  We'll contact you with results.  pending the test results, please change the insulin to 90 units each morning, and 10 units in the evening.   You can stop the janumet pill.  Call if this causes your blood sugar to go up.

## 2012-05-22 ENCOUNTER — Encounter (INDEPENDENT_AMBULATORY_CARE_PROVIDER_SITE_OTHER): Payer: Self-pay | Admitting: Ophthalmology

## 2012-06-13 ENCOUNTER — Encounter: Payer: Self-pay | Admitting: Internal Medicine

## 2012-06-13 ENCOUNTER — Ambulatory Visit (INDEPENDENT_AMBULATORY_CARE_PROVIDER_SITE_OTHER): Payer: Medicare Other | Admitting: Internal Medicine

## 2012-06-13 VITALS — BP 157/86 | HR 81 | Ht 67.0 in | Wt 218.0 lb

## 2012-06-13 DIAGNOSIS — I495 Sick sinus syndrome: Secondary | ICD-10-CM

## 2012-06-13 DIAGNOSIS — I10 Essential (primary) hypertension: Secondary | ICD-10-CM

## 2012-06-13 LAB — PACEMAKER DEVICE OBSERVATION
ATRIAL PACING PM: 74
BAMS-0001: 150 {beats}/min
BATTERY VOLTAGE: 2.79 V
RV LEAD THRESHOLD: 0.75 V
VENTRICULAR PACING PM: 36

## 2012-06-13 NOTE — Patient Instructions (Signed)
Your physician wants you to follow-up in: Nov with Dr Johney Frame Bonita Quin will receive a reminder letter in the mail two months in advance. If you don't receive a letter, please call our office to schedule the follow-up appointment.

## 2012-06-13 NOTE — Progress Notes (Signed)
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  Since her recent PPM ipmlant, the patient reports doing very well.  Her SOB and SOB are much improved. Today, she denies symptoms of palpitations, chest pain,  lower extremity edema, dizziness, presyncope, or syncope.  The patient is otherwise without complaint today.   Past Medical History  Diagnosis Date  . CAD (coronary artery disease)     Multivessel s/p PCI w/DES 2008 and CABG 2011  . HTN (hypertension)   . Type II or unspecified type diabetes mellitus without mention of complication, not stated as uncontrolled   . PVD (peripheral vascular disease)   . Diverticulosis of colon   . Morbid obesity   . Gout   . Depression   . Osteoarthritis   . Osteopenia   . Hyperlipidemia   . LBP (low back pain)     Lumbar disc disease/lumbar spinal stenosis  . Disc disease, degenerative, cervical   . History of thrombocytopenia   . Anxiety   . GERD (gastroesophageal reflux disease)   . Barrett esophagus   . Gastroparesis   . Helicobacter pylori gastritis   . Allergic rhinitis   . Anemia   . Myocardial infarction   . Hypothyroidism   . H/O hiatal hernia   . Sick sinus syndrome     PPM implant   Past Surgical History  Procedure Laterality Date  . Coronary stent placement      Drug-eluting stent to the left anterior descending, circumflex and right coronary artery in Jan 2009  . Cholecystectomy    . Abdominal hysterectomy    . Tubal ligation    . Ovarian cyst removal    . Coronary stent placement      x 3 Dr Excell Seltzer  . Coronary artery bypass graft      02/2010  . Eye surgery      BIL CATARACT REMOVAL 06/2010  . Shoulder arthroscopy  06/16/2011    Procedure: ARTHROSCOPY SHOULDER;  Surgeon: Kennieth Rad, MD;  Location: [facility] OR;  Service: Orthopedics;  Laterality: Left;  LEFT SHOULDER ARTHROSCOPY ACROMIALPLASTY, POSSIBLE MINI OPEN CUFF  REPAIR   . Cardiac catheterization      2011  DR COOPER (APPT NEXT WEEK)  . Pacemaker insertion  03/06/12    MDT Adapta L implanted by Dr Johney Frame for SSS    Current Outpatient Prescriptions  Medication Sig Dispense Refill  . acetaminophen (TYLENOL) 500 MG tablet Take 500 mg by mouth 2 (two) times daily as needed. For pain      . allopurinol (ZYLOPRIM) 100 MG tablet Take 100 mg by mouth daily.      Marland Kitchen aspirin 81 MG chewable tablet Chew 81 mg by mouth every evening.       . chlorthalidone (HYGROTON) 25 MG tablet Take 1 tablet (25 mg total) by mouth daily.  30 tablet  0  . clopidogrel (PLAVIX) 75 MG tablet Take 1 tablet (75 mg total) by mouth every evening.  30 tablet  6  . hydrALAZINE (APRESOLINE) 25 MG tablet Take 1 tablet (25 mg total) by mouth every 8 (eight) hours.  42 tablet  0  . insulin NPH (HUMULIN N,NOVOLIN N) 100 UNIT/ML injection 45 units each morning, and 10 units in the evening      . iron polysaccharides (NIFEREX) 150 MG capsule Take 150 mg by mouth daily.        Marland Kitchen  levothyroxine (SYNTHROID, LEVOTHROID) 100 MCG tablet Take 1 tablet (100 mcg total) by mouth daily.  30 tablet  0  . nitroGLYCERIN (NITROSTAT) 0.4 MG SL tablet Place 0.4 mg under the tongue every 5 (five) minutes as needed. For chest pain      . omeprazole (PRILOSEC) 20 MG capsule Take 20 mg by mouth 2 (two) times daily.       . pravastatin (PRAVACHOL) 40 MG tablet Take 1 tablet (40 mg total) by mouth every evening.  90 tablet  3  . PRESCRIPTION MEDICATION Take 2 tablets by mouth every evening. Pt. On a study medication through [facility] cardiology      . [DISCONTINUED] atorvastatin (LIPITOR) 20 MG tablet Take 20 mg by mouth daily.       No current facility-administered medications for this visit.    Physical Exam: Filed Vitals:   06/13/12 1025  BP: 157/86  Pulse: 81  Height: 5\' 7"  (1.702 m)  Weight: 218 lb (98.884 kg)    GEN- The patient is well appearing, alert and oriented x 3 today.   Head- normocephalic,  atraumatic Eyes-  Sclera clear, conjunctiva pink Ears- hearing intact Oropharynx- clear Lungs- Clear to ausculation bilaterally, normal work of breathing Chest- pacemaker pocket is well healed Heart- Regular rate and rhythm, no murmurs, rubs or gallops, PMI not laterally displaced GI- soft, NT, ND, + BS Extremities- no clubbing, cyanosis, or edema  Pacemaker interrogation- reviewed in detail today,  See PACEART report  Assessment and Plan:  1. Bradycardia Normal pacemaker function See Pace Art report No changes today  2. HTN Above goal Salt restriction advised Given multiple allergies and prior renal failure, I am reluctant to make changes today  Return to the device clinic 11/14

## 2012-06-21 ENCOUNTER — Telehealth: Payer: Self-pay | Admitting: Internal Medicine

## 2012-06-21 NOTE — Telephone Encounter (Signed)
Left message on Whitney's voicemail with this information (EF). I do not see a diagnosis of CHF in the pt's chart.

## 2012-06-21 NOTE — Telephone Encounter (Signed)
New problem    Need a most recent EJ & diagnosis.  To confirm that she does have heart failure.

## 2012-07-18 ENCOUNTER — Other Ambulatory Visit: Payer: Self-pay | Admitting: *Deleted

## 2012-07-18 ENCOUNTER — Telehealth: Payer: Self-pay | Admitting: *Deleted

## 2012-07-18 MED ORDER — LOSARTAN POTASSIUM 100 MG PO TABS: 100.0000 mg | ORAL_TABLET | Freq: Every day | ORAL | Status: DC

## 2012-07-18 NOTE — Telephone Encounter (Signed)
Call patient to verify if she was still taking Losartan 100mg . Patient states it was stopped at the hospital when she was sick but is taking it again and have been taking it once daily everyday. Rx request sent to  [facility] on [address] in [place].  Losartan 100mg , once daily by mouth, #30, 3RF   Rubbie Goostree CMA

## 2012-08-01 ENCOUNTER — Other Ambulatory Visit: Payer: Self-pay | Admitting: *Deleted

## 2012-08-01 MED ORDER — LOSARTAN POTASSIUM 100 MG PO TABS: 100.0000 mg | ORAL_TABLET | Freq: Every day | ORAL | Status: DC

## 2012-09-20 ENCOUNTER — Other Ambulatory Visit: Payer: Self-pay | Admitting: Cardiovascular Disease

## 2012-10-09 ENCOUNTER — Other Ambulatory Visit: Payer: Self-pay | Admitting: Cardiovascular Disease

## 2012-10-21 ENCOUNTER — Other Ambulatory Visit: Payer: Self-pay | Admitting: Cardiovascular Disease

## 2012-10-28 ENCOUNTER — Encounter (HOSPITAL_COMMUNITY): Payer: Self-pay | Admitting: *Deleted

## 2012-10-28 ENCOUNTER — Emergency Department (HOSPITAL_COMMUNITY): Payer: Medicare Other

## 2012-10-28 ENCOUNTER — Observation Stay (HOSPITAL_COMMUNITY)
Admission: EM | Admit: 2012-10-28 | Discharge: 2012-10-29 | Disposition: A | Discharge status: Home / Self Care | Payer: Medicare Other | Attending: Internal Medicine | Admitting: Internal Medicine

## 2012-10-28 DIAGNOSIS — M545 Low back pain, unspecified: Secondary | ICD-10-CM

## 2012-10-28 DIAGNOSIS — I2510 Atherosclerotic heart disease of native coronary artery without angina pectoris: Secondary | ICD-10-CM

## 2012-10-28 DIAGNOSIS — K219 Gastro-esophageal reflux disease without esophagitis: Secondary | ICD-10-CM

## 2012-10-28 DIAGNOSIS — M949 Disorder of cartilage, unspecified: Secondary | ICD-10-CM

## 2012-10-28 DIAGNOSIS — I739 Peripheral vascular disease, unspecified: Secondary | ICD-10-CM

## 2012-10-28 DIAGNOSIS — R609 Edema, unspecified: Secondary | ICD-10-CM

## 2012-10-28 DIAGNOSIS — F329 Major depressive disorder, single episode, unspecified: Secondary | ICD-10-CM

## 2012-10-28 DIAGNOSIS — M48061 Spinal stenosis, lumbar region without neurogenic claudication: Secondary | ICD-10-CM

## 2012-10-28 DIAGNOSIS — N179 Acute kidney failure, unspecified: Secondary | ICD-10-CM

## 2012-10-28 DIAGNOSIS — M1990 Unspecified osteoarthritis, unspecified site: Secondary | ICD-10-CM

## 2012-10-28 DIAGNOSIS — J309 Allergic rhinitis, unspecified: Secondary | ICD-10-CM

## 2012-10-28 DIAGNOSIS — F411 Generalized anxiety disorder: Secondary | ICD-10-CM

## 2012-10-28 DIAGNOSIS — B029 Zoster without complications: Secondary | ICD-10-CM

## 2012-10-28 DIAGNOSIS — IMO0002 Reserved for concepts with insufficient information to code with codable children: Secondary | ICD-10-CM

## 2012-10-28 DIAGNOSIS — I5031 Acute diastolic (congestive) heart failure: Secondary | ICD-10-CM

## 2012-10-28 DIAGNOSIS — E119 Type 2 diabetes mellitus without complications: Secondary | ICD-10-CM

## 2012-10-28 DIAGNOSIS — R490 Dysphonia: Secondary | ICD-10-CM

## 2012-10-28 DIAGNOSIS — Z7902 Long term (current) use of antithrombotics/antiplatelets: Secondary | ICD-10-CM | POA: Insufficient documentation

## 2012-10-28 DIAGNOSIS — Z950 Presence of cardiac pacemaker: Secondary | ICD-10-CM | POA: Insufficient documentation

## 2012-10-28 DIAGNOSIS — Z8719 Personal history of other diseases of the digestive system: Secondary | ICD-10-CM

## 2012-10-28 DIAGNOSIS — E860 Dehydration: Secondary | ICD-10-CM

## 2012-10-28 DIAGNOSIS — Z0000 Encounter for general adult medical examination without abnormal findings: Secondary | ICD-10-CM

## 2012-10-28 DIAGNOSIS — F3289 Other specified depressive episodes: Secondary | ICD-10-CM

## 2012-10-28 DIAGNOSIS — I10 Essential (primary) hypertension: Secondary | ICD-10-CM

## 2012-10-28 DIAGNOSIS — R809 Proteinuria, unspecified: Secondary | ICD-10-CM

## 2012-10-28 DIAGNOSIS — R197 Diarrhea, unspecified: Secondary | ICD-10-CM

## 2012-10-28 DIAGNOSIS — R109 Unspecified abdominal pain: Secondary | ICD-10-CM

## 2012-10-28 DIAGNOSIS — M5030 Other cervical disc degeneration, unspecified cervical region: Secondary | ICD-10-CM

## 2012-10-28 DIAGNOSIS — M51379 Other intervertebral disc degeneration, lumbosacral region without mention of lumbar back pain or lower extremity pain: Secondary | ICD-10-CM

## 2012-10-28 DIAGNOSIS — M25512 Pain in left shoulder: Secondary | ICD-10-CM

## 2012-10-28 DIAGNOSIS — Z9861 Coronary angioplasty status: Secondary | ICD-10-CM | POA: Insufficient documentation

## 2012-10-28 DIAGNOSIS — E785 Hyperlipidemia, unspecified: Secondary | ICD-10-CM

## 2012-10-28 DIAGNOSIS — E1029 Type 1 diabetes mellitus with other diabetic kidney complication: Secondary | ICD-10-CM

## 2012-10-28 DIAGNOSIS — K3184 Gastroparesis: Secondary | ICD-10-CM

## 2012-10-28 DIAGNOSIS — R0602 Shortness of breath: Secondary | ICD-10-CM | POA: Insufficient documentation

## 2012-10-28 DIAGNOSIS — Z794 Long term (current) use of insulin: Secondary | ICD-10-CM | POA: Insufficient documentation

## 2012-10-28 DIAGNOSIS — I495 Sick sinus syndrome: Secondary | ICD-10-CM

## 2012-10-28 DIAGNOSIS — I5033 Acute on chronic diastolic (congestive) heart failure: Secondary | ICD-10-CM

## 2012-10-28 DIAGNOSIS — I129 Hypertensive chronic kidney disease with stage 1 through stage 4 chronic kidney disease, or unspecified chronic kidney disease: Secondary | ICD-10-CM | POA: Insufficient documentation

## 2012-10-28 DIAGNOSIS — R079 Chest pain, unspecified: Principal | ICD-10-CM

## 2012-10-28 DIAGNOSIS — L299 Pruritus, unspecified: Secondary | ICD-10-CM

## 2012-10-28 DIAGNOSIS — M109 Gout, unspecified: Secondary | ICD-10-CM

## 2012-10-28 DIAGNOSIS — Z79899 Other long term (current) drug therapy: Secondary | ICD-10-CM | POA: Insufficient documentation

## 2012-10-28 DIAGNOSIS — N182 Chronic kidney disease, stage 2 (mild): Secondary | ICD-10-CM

## 2012-10-28 DIAGNOSIS — D649 Anemia, unspecified: Secondary | ICD-10-CM

## 2012-10-28 DIAGNOSIS — M5137 Other intervertebral disc degeneration, lumbosacral region: Secondary | ICD-10-CM

## 2012-10-28 DIAGNOSIS — Z951 Presence of aortocoronary bypass graft: Secondary | ICD-10-CM | POA: Insufficient documentation

## 2012-10-28 DIAGNOSIS — M899 Disorder of bone, unspecified: Secondary | ICD-10-CM

## 2012-10-28 DIAGNOSIS — M549 Dorsalgia, unspecified: Secondary | ICD-10-CM

## 2012-10-28 DIAGNOSIS — R42 Dizziness and giddiness: Secondary | ICD-10-CM

## 2012-10-28 DIAGNOSIS — K5730 Diverticulosis of large intestine without perforation or abscess without bleeding: Secondary | ICD-10-CM

## 2012-10-28 HISTORY — DX: Chronic kidney disease, stage 2 (mild): N18.2

## 2012-10-28 LAB — CBC
Hemoglobin: 12.5 g/dL (ref 12.0–15.0)
RBC: 4.01 MIL/uL (ref 3.87–5.11)
WBC: 8.4 10*3/uL (ref 4.0–10.5)

## 2012-10-28 LAB — BASIC METABOLIC PANEL
CO2: 29 mEq/L (ref 19–32)
Chloride: 102 mEq/L (ref 96–112)
GFR calc non Af Amer: 50 mL/min — ABNORMAL LOW (ref >90)
Glucose, Bld: 179 mg/dL — ABNORMAL HIGH (ref 70–99)
Potassium: 4 mEq/L (ref 3.5–5.1)
Sodium: 140 mEq/L (ref 135–145)

## 2012-10-28 LAB — POCT I-STAT TROPONIN I: Troponin i, poc: 0.01 ng/mL (ref 0.00–0.08)

## 2012-10-28 LAB — GLUCOSE, CAPILLARY: Glucose-Capillary: 89 mg/dL (ref 70–99)

## 2012-10-28 MED ORDER — ASPIRIN 325 MG PO TABS
325.0000 mg | ORAL_TABLET | Freq: Once | ORAL | Status: AC
  Administered 2012-10-28: 325 mg via ORAL
  Filled 2012-10-28: qty 1

## 2012-10-28 MED ORDER — SODIUM CHLORIDE 0.9 % IV BOLUS (SEPSIS)
500.0000 mL | Freq: Once | INTRAVENOUS | Status: AC
  Administered 2012-10-28: 500 mL via INTRAVENOUS

## 2012-10-28 NOTE — ED Notes (Signed)
Pt c/o intermittent left CP since last night, woke up from pain, became diaphoretic. Also experienced nausea, but denies vomiting. Pain exacerbated by nothing, but pt becomes SOB when walking. Pain relived by nothing.

## 2012-10-28 NOTE — ED Provider Notes (Signed)
History    CSN: [id number] Arrival date & time 10/28/12  1906  First MD Initiated Contact with Patient 10/28/12 1925     Chief Complaint  Patient presents with  . Chest Pain   (Consider location/radiation/quality/duration/timing/severity/associated sxs/prior Treatment) The history is provided by the patient and a relative. No language interpreter was used.  [name] is a 79 y/o F with PMHx of CAD, HTN, DM type II, gout, depression, GERD, gastroparesis, CABG x 5, cardiac cath 2011, PM placement 02/2012 due to sick sinus syndrome presenting to the ED with intermittent chest pain that has been ongoing since yesterday. Patient reported that chest pain began last night, woke her up out of a sound sleep, stated that the pain is a stabbing pain localized to the left side of the chest with radiation to the left arm. Patient reported that episode began at 2:00AM this morning and lasted until 6:00AM this morning - stated that another episode occurred 2.5 hours ago and stated that it is still occurring, but subtle. Reported that she nothing makes the pain better or worse - stated that the pain occurs at rest. Reported that she has taken nothing for the pain. Stated that she gets shortness of breath with activity. Stated that she has been feeling rather dizzy lately. Denied falling, injury, numbness, tingling, chills, fever, vomiting, diarrhea, headache, weakness, blurred vision, loss of vision, difficulty breathing.  PCP Dr. Parke Simmers Cardio: Dr. Excell Seltzer  Past Medical History  Diagnosis Date  . CAD (coronary artery disease) 2009    Multivessel s/p PCI w/DES 2009 and CABG 2011  . HTN (hypertension)   . Type II or unspecified type diabetes mellitus without mention of complication, not stated as uncontrolled   . PVD (peripheral vascular disease)   . Diverticulosis of colon   . Morbid obesity   . Gout   . Depression   . Osteoarthritis   . Osteopenia   . Hyperlipidemia   . LBP (low back pain)    Lumbar disc disease/lumbar spinal stenosis  . Disc disease, degenerative, cervical   . History of thrombocytopenia   . Anxiety   . GERD (gastroesophageal reflux disease)   . Barrett esophagus   . Gastroparesis   . Helicobacter pylori gastritis   . Allergic rhinitis   . Anemia   . Myocardial infarction   . Hypothyroidism   . H/O hiatal hernia   . Sick sinus syndrome     Dual-chamber PPM implant 02/2012  . CKD (chronic kidney disease), stage II     GFR 60-89 ml/min   Past Surgical History  Procedure Laterality Date  . Coronary stent placement      Drug-eluting stent to the left anterior descending, circumflex and right coronary artery in Jan 2009  . Cholecystectomy    . Abdominal hysterectomy    . Tubal ligation    . Ovarian cyst removal    . Coronary artery bypass graft  2011    LIMA-LAD, SVG-DIAG, SVG-OM1-OM2, SVG-PDA  . Eye surgery      BIL CATARACT REMOVAL 06/2010  . Shoulder arthroscopy  06/16/2011    Procedure: ARTHROSCOPY SHOULDER;  Surgeon: Kennieth Rad, MD;  Location: [facility] OR;  Service: Orthopedics;  Laterality: Left;  LEFT SHOULDER ARTHROSCOPY ACROMIALPLASTY, POSSIBLE MINI OPEN CUFF REPAIR   . Cardiac catheterization      2011  DR COOPER (APPT NEXT WEEK)  . Pacemaker insertion  03/06/12    MDT Adapta L implanted by Dr Johney Frame for SSS  Family History  Problem Relation Age of Onset  . Diabetes Mother   . Hypertension Mother   . Stroke Father   . Coronary artery disease Other    History  Substance Use Topics  . Smoking status: Former Games developer  . Smokeless tobacco: Never Used     Comment: quit 30 yrs ago  . Alcohol Use: No   OB History   Grav Para Term Preterm Abortions TAB SAB Ect Mult Living                 Review of Systems  Constitutional: Negative for fever and chills.  HENT: Negative for neck pain.   Respiratory: Positive for shortness of breath (with activity). Negative for chest tightness.   Cardiovascular: Positive for chest pain.    Gastrointestinal: Positive for nausea. Negative for abdominal pain.  Genitourinary: Negative for decreased urine volume and difficulty urinating.  Neurological: Positive for dizziness. Negative for weakness and light-headedness.  All other systems reviewed and are negative.    Allergies  Ciprofloxacin; Codeine; Diltiazem hcl; Hydrocodone; Lovastatin; Metformin; Penicillins; Shellfish allergy; and Sulfonamide derivatives  Home Medications   Current Outpatient Rx  Name  Route  Sig  Dispense  Refill  . acetaminophen (TYLENOL) 500 MG tablet   Oral   Take 500 mg by mouth 2 (two) times daily as needed for pain.          Marland Kitchen allopurinol (ZYLOPRIM) 100 MG tablet   Oral   Take 100 mg by mouth daily.         Marland Kitchen aspirin 81 MG chewable tablet   Oral   Chew 81 mg by mouth every evening.          . bumetanide (BUMEX) 1 MG tablet   Oral   Take 3 mg by mouth daily.         . chlorthalidone (HYGROTON) 25 MG tablet   Oral   Take 1 tablet (25 mg total) by mouth daily.   30 tablet   0   . clopidogrel (PLAVIX) 75 MG tablet   Oral   Take 75 mg by mouth daily.         . hydrALAZINE (APRESOLINE) 25 MG tablet   Oral   Take 1 tablet (25 mg total) by mouth every 8 (eight) hours.   42 tablet   0   . insulin NPH (HUMULIN N,NOVOLIN N) 100 UNIT/ML injection   Subcutaneous   Inject 10-45 Units into the skin 2 (two) times daily at 8 am and 10 pm. 45 units each morning, and 10 units in the evening         . iron polysaccharides (NIFEREX) 150 MG capsule   Oral   Take 150 mg by mouth daily.           Marland Kitchen levothyroxine (SYNTHROID, LEVOTHROID) 100 MCG tablet   Oral   Take 1 tablet (100 mcg total) by mouth daily.   30 tablet   0     **The patient needs to make an appointment with Dr ...   . losartan (COZAAR) 100 MG tablet   Oral   Take 1 tablet (100 mg total) by mouth daily.   90 tablet   3   . nitroGLYCERIN (NITROSTAT) 0.4 MG SL tablet   Sublingual   Place 0.4 mg under the  tongue every 5 (five) minutes as needed for chest pain.          Marland Kitchen omeprazole (PRILOSEC) 20 MG capsule   Oral  Take 20 mg by mouth 2 (two) times daily.          . pravastatin (PRAVACHOL) 40 MG tablet   Oral   Take 40 mg by mouth daily.         Marland Kitchen PRESCRIPTION MEDICATION   Oral   Take 2 tablets by mouth every evening. Pt. On a study medication through [facility] cardiology         . SitaGLIPtin-MetFORMIN HCl (JANUMET PO)   Oral   Take by mouth.          BP 153/58  Pulse 63  Temp(Src) 97.6 F (36.4 C) (Oral)  Resp 18  Ht 5\' 7"  (1.702 m)  Wt 227 lb 4.8 oz (103.103 kg)  BMI 35.59 kg/m2  SpO2 100% Physical Exam  Nursing note and vitals reviewed. Constitutional: She is oriented to person, place, and time. She appears well-developed and well-nourished. No distress.  Calm and cooperative NAD  HENT:  Head: Normocephalic and atraumatic.  Mouth/Throat: Oropharynx is clear and moist.  Eyes: Conjunctivae and EOM are normal. Pupils are equal, round, and reactive to light. Right eye exhibits no discharge. Left eye exhibits no discharge.  Neck: Normal range of motion. Neck supple.  Cardiovascular: Normal rate, regular rhythm and normal heart sounds.  Exam reveals no friction rub.   No murmur heard. Pulses:      Radial pulses are 2+ on the right side, and 2+ on the left side.       Dorsalis pedis pulses are 2+ on the right side, and 2+ on the left side.  Negative swelling to ankles and feet bilaterally Negative pitting edema  Pulmonary/Chest: Effort normal and breath sounds normal. No respiratory distress. She has no wheezes. She has no rales. She exhibits tenderness.    Chest pain reproducible upon palpation to the left side of the chest  Musculoskeletal: Normal range of motion.  Lymphadenopathy:    She has no cervical adenopathy.  Neurological: She is alert and oriented to person, place, and time. No cranial nerve deficit. She exhibits normal muscle tone. Coordination  normal.  Cranial nerves III-XII grossly intact Strength 5+/5+ with resistance  Skin: Skin is warm and dry. No rash noted. She is not diaphoretic. No erythema.  Linear scar noted to sternal region secondary to surgery.   Psychiatric: She has a normal mood and affect. Her behavior is normal. Thought content normal.    ED Course  Procedures (including critical care time)  1:28AM Spoke with Dr. Leodis Binet from [facility]- patient to be admitted to Telemetry for observation.    Date: 10/28/2012  Rate: 63  Rhythm: normal sinus rhythm  QRS Axis: normal  Intervals: normal  ST/T Wave abnormalities: nonspecific T wave changes  Conduction Disutrbances:none  Narrative Interpretation: atrial paced PM noted - T-wave inversions noted.   Old EKG Reviewed: changes noted Dr. Mayford Knife - Cardiology - reviewed the EKG and reported that this is a normal variant in patient's with a pacemaker.    Labs Reviewed  CBC - Abnormal; Notable for the following:    Platelets 127 (*)    All other components within normal limits  BASIC METABOLIC PANEL - Abnormal; Notable for the following:    Glucose, Bld 179 (*)    BUN 31 (*)    GFR calc non Af Amer 50 (*)    GFR calc Af Amer 58 (*)    All other components within normal limits  CBC - Abnormal; Notable for the following:  Platelets 114 (*)    All other components within normal limits  CREATININE, SERUM - Abnormal; Notable for the following:    GFR calc non Af Amer 50 (*)    GFR calc Af Amer 58 (*)    All other components within normal limits  GLUCOSE, CAPILLARY - Abnormal; Notable for the following:    Glucose-Capillary 206 (*)    All other components within normal limits  GLUCOSE, CAPILLARY - Abnormal; Notable for the following:    Glucose-Capillary 191 (*)    All other components within normal limits  PRO B NATRIURETIC PEPTIDE  GLUCOSE, CAPILLARY  APTT  PROTIME-INR  TROPONIN I  TROPONIN I  TROPONIN I  TROPONIN I  POCT I-STAT TROPONIN I     Dg Chest 2 View  10/28/2012   *RADIOLOGY REPORT*  Clinical Data: Chest pain  CHEST - 2 VIEW  Comparison: Prior radiograph from 03/07/2012  Findings: Left-sided dual lead transvenous pacemaker / AICD is stable position with electrodes overlying the right atrium and ventricle.  Median sternotomy wires of underlying cavity markers are again noted.  Heart size is stable, and remains within normal limits.  The lungs are hypoinflated.  Minimal linear opacity at the left lung bases is most consistent with subsegmental atelectasis.  There is no airspace consolidation, pleural effusion, or pulmonary edema. No pneumothorax.  No acute osseous abnormality.  IMPRESSION:  Hypoinflation with mild left basilar atelectasis.  No acute cardiopulmonary process.   Original Report Authenticated By: Rise Mu, M.D.   1. Chest pain   2. CAD (coronary artery disease)   3. HYPERLIPIDEMIA   4. HYPERTENSION   5. CORONARY ARTERY DISEASE   6. CHEST PAIN   7. CKD (chronic kidney disease) stage 2, GFR 60-89 ml/min   8. DM (diabetes mellitus)   9. SHINGLES   10. GOUT   11. Morbid obesity   12. ANXIETY   13. DEPRESSION   14. SINUS BRADYCARDIA   15. DIASTOLIC HEART FAILURE, ACUTE   16. Acute on chronic diastolic heart failure   17. PERIPHERAL VASCULAR DISEASE   18. ALLERGIC RHINITIS   19. GERD   20. Gastroparesis   21. DIVERTICULOSIS, COLON   22. OSTEOARTHRITIS   23. DISC DISEASE, CERVICAL   24. DISC DISEASE, LUMBAR   25. SPINAL STENOSIS, LUMBAR   26. LOW BACK PAIN   27. BACK PAIN   28. OSTEOPENIA   29. DIZZINESS   30. PERIPHERAL EDEMA   31. Abdominal pain, unspecified site   32. Proteinuria   33. MUSCLE STRAIN, RIGHT BUTTOCK   34. Personal History of Other Diseases of Digestive Disease   35. ANEMIA-NOS   36. PRURITUS   37. HOARSENESS   38. Left shoulder pain   39. Preventative health care   40. Acute renal failure   41. Diarrhea   42. Dehydration   43. Type I (juvenile type) diabetes  mellitus with renal manifestations, not stated as uncontrolled(250.41)     MDM  Patient is a 79 y/o F with CABG x 5 and MDT Adapta PM presenting to the ED with intermittent chest pain that has been ongoing started yesterday evening, focal to the left side of the chest with radiation down the left arm at times - described as a sharp, stabbing pain. Mild nausea and dizziness reported. Alert and oriented x 3. EKG noted T-wave inversions in various leads - reviewed by Dr. Mayford Knife who reported a normal variant and recommended two sets of troponins with admission to internal medicine. Lungs  clear to auscultation. Chest pain reproducible upon palpation, mainly to the left side of the chest. Strength intact with resistance. Pulses palpable. Negative swelling and pitting edema noted to lower extremities bilaterally. Patient reported at least 5 episodes of sharp shooting pain that lasted approximately 2-3 minutes while being in the ED - when offered nitroglycerin patient refused. Patient was given ASA 325 mg. EKG negative STEMI. First set of troponins 0.01, second set of troponins 0.30. CBC negative findings. BMP elevation in BUN noted - azotemia. BNP 381.5. Chest xray noted hypoinflation with mild left basilar atelectasis with negative acute abnormalities noted. Patient stable, afebrile.  Patient admitted to Internal Medicine - spoke with Dr. Leodis Binet - admitted to Telemetry for observation due to chest pain reoccurrence and patient being high risk. Discussed with patient and patient agreed to plan.            Raymon Mutton, PA-C 10/29/12 1646

## 2012-10-28 NOTE — ED Notes (Signed)
Pt worried about sugar being low. CBG was checked 89

## 2012-10-29 ENCOUNTER — Encounter (HOSPITAL_COMMUNITY): Payer: Self-pay | Admitting: *Deleted

## 2012-10-29 DIAGNOSIS — I2510 Atherosclerotic heart disease of native coronary artery without angina pectoris: Secondary | ICD-10-CM

## 2012-10-29 DIAGNOSIS — R079 Chest pain, unspecified: Principal | ICD-10-CM

## 2012-10-29 LAB — CBC
Platelets: 114 10*3/uL — ABNORMAL LOW (ref 150–400)
RBC: 4.16 MIL/uL (ref 3.87–5.11)
RDW: 13.3 % (ref 11.5–15.5)
WBC: 8.3 10*3/uL (ref 4.0–10.5)

## 2012-10-29 LAB — PROTIME-INR: Prothrombin Time: 13.4 seconds (ref 11.6–15.2)

## 2012-10-29 LAB — GLUCOSE, CAPILLARY

## 2012-10-29 LAB — CREATININE, SERUM
GFR calc Af Amer: 58 mL/min — ABNORMAL LOW (ref >90)
GFR calc non Af Amer: 50 mL/min — ABNORMAL LOW (ref >90)

## 2012-10-29 LAB — APTT: aPTT: 26 seconds (ref 24–37)

## 2012-10-29 LAB — TROPONIN I: Troponin I: <0.3 ng/mL (ref <0.30)

## 2012-10-29 MED ORDER — ASPIRIN 81 MG PO CHEW
81.0000 mg | CHEWABLE_TABLET | Freq: Every evening | ORAL | Status: DC
  Filled 2012-10-29: qty 1

## 2012-10-29 MED ORDER — NITROGLYCERIN 0.4 MG SL SUBL: 0.4000 mg | SUBLINGUAL_TABLET | SUBLINGUAL | Status: DC | PRN

## 2012-10-29 MED ORDER — POLYSACCHARIDE IRON COMPLEX 150 MG PO CAPS
150.0000 mg | ORAL_CAPSULE | Freq: Every day | ORAL | Status: DC
  Administered 2012-10-29: 150 mg via ORAL
  Filled 2012-10-29: qty 1

## 2012-10-29 MED ORDER — CHLORTHALIDONE 25 MG PO TABS
25.0000 mg | ORAL_TABLET | Freq: Every day | ORAL | Status: DC
  Administered 2012-10-29: 25 mg via ORAL
  Filled 2012-10-29: qty 1

## 2012-10-29 MED ORDER — INSULIN NPH (HUMAN) (ISOPHANE) 100 UNIT/ML ~~LOC~~ SUSP
45.0000 [IU] | Freq: Every day | SUBCUTANEOUS | Status: DC
  Filled 2012-10-29: qty 10

## 2012-10-29 MED ORDER — ONDANSETRON HCL 4 MG/2ML IJ SOLN: 4.0000 mg | Freq: Four times a day (QID) | INTRAMUSCULAR | Status: DC | PRN

## 2012-10-29 MED ORDER — INSULIN ASPART 100 UNIT/ML ~~LOC~~ SOLN
0.0000 [IU] | Freq: Three times a day (TID) | SUBCUTANEOUS | Status: DC
  Administered 2012-10-29 (×2): 3 [IU] via SUBCUTANEOUS

## 2012-10-29 MED ORDER — PANTOPRAZOLE SODIUM 40 MG PO TBEC
40.0000 mg | DELAYED_RELEASE_TABLET | Freq: Every day | ORAL | Status: DC
  Administered 2012-10-29: 40 mg via ORAL
  Filled 2012-10-29: qty 1

## 2012-10-29 MED ORDER — CARVEDILOL 3.125 MG PO TABS
3.1250 mg | ORAL_TABLET | Freq: Two times a day (BID) | ORAL | Status: DC
  Filled 2012-10-29 (×2): qty 1

## 2012-10-29 MED ORDER — ACETAMINOPHEN 500 MG PO TABS: 500.0000 mg | ORAL_TABLET | Freq: Two times a day (BID) | ORAL | Status: DC | PRN

## 2012-10-29 MED ORDER — HYDRALAZINE HCL 20 MG/ML IJ SOLN: 10.0000 mg | INTRAMUSCULAR | Status: DC | PRN

## 2012-10-29 MED ORDER — HYDRALAZINE HCL 25 MG PO TABS
25.0000 mg | ORAL_TABLET | Freq: Three times a day (TID) | ORAL | Status: DC
  Administered 2012-10-29 (×2): 25 mg via ORAL
  Filled 2012-10-29 (×5): qty 1

## 2012-10-29 MED ORDER — SIMVASTATIN 40 MG PO TABS
40.0000 mg | ORAL_TABLET | Freq: Every day | ORAL | Status: DC
  Filled 2012-10-29: qty 1

## 2012-10-29 MED ORDER — ALLOPURINOL 100 MG PO TABS
100.0000 mg | ORAL_TABLET | Freq: Every day | ORAL | Status: DC
  Administered 2012-10-29: 100 mg via ORAL
  Filled 2012-10-29: qty 1

## 2012-10-29 MED ORDER — INSULIN ASPART 100 UNIT/ML ~~LOC~~ SOLN: 0.0000 [IU] | Freq: Every day | SUBCUTANEOUS | Status: DC

## 2012-10-29 MED ORDER — INSULIN NPH (HUMAN) (ISOPHANE) 100 UNIT/ML ~~LOC~~ SUSP: 10.0000 [IU] | Freq: Every day | SUBCUTANEOUS | Status: DC

## 2012-10-29 MED ORDER — HEPARIN SODIUM (PORCINE) 5000 UNIT/ML IJ SOLN
5000.0000 [IU] | Freq: Three times a day (TID) | INTRAMUSCULAR | Status: DC
  Administered 2012-10-29: 5000 [IU] via SUBCUTANEOUS
  Filled 2012-10-29 (×4): qty 1

## 2012-10-29 MED ORDER — LEVOTHYROXINE SODIUM 100 MCG PO TABS
100.0000 ug | ORAL_TABLET | Freq: Every day | ORAL | Status: DC
  Administered 2012-10-29: 100 ug via ORAL
  Filled 2012-10-29 (×2): qty 1

## 2012-10-29 MED ORDER — CLOPIDOGREL BISULFATE 75 MG PO TABS
75.0000 mg | ORAL_TABLET | Freq: Every day | ORAL | Status: DC
  Administered 2012-10-29: 75 mg via ORAL
  Filled 2012-10-29: qty 1

## 2012-10-29 MED ORDER — BUMETANIDE 2 MG PO TABS
3.0000 mg | ORAL_TABLET | Freq: Every day | ORAL | Status: DC
  Administered 2012-10-29: 3 mg via ORAL
  Filled 2012-10-29: qty 1

## 2012-10-29 MED ORDER — ACETAMINOPHEN 325 MG PO TABS: 650.0000 mg | ORAL_TABLET | ORAL | Status: DC | PRN

## 2012-10-29 MED ORDER — LOSARTAN POTASSIUM 50 MG PO TABS
100.0000 mg | ORAL_TABLET | Freq: Every day | ORAL | Status: DC
  Administered 2012-10-29: 100 mg via ORAL
  Filled 2012-10-29: qty 2

## 2012-10-29 NOTE — Progress Notes (Signed)
Pt given d/c instructions at this time; pt verbalized understanding; IV and tele monitor removed; pt to d/c home with family.

## 2012-10-29 NOTE — ED Notes (Addendum)
Pt requested something to eat and MD [place]  ok'd it. Pt given a happy meal with a sprite zero.

## 2012-10-29 NOTE — Discharge Summary (Signed)
[facility]                                                                                   [name], is a 79 y.o. female  DOB May 21, 1932  MRN [id number].  Admission date:  10/28/2012  Discharge Date:  10/29/2012  Primary MD  Geraldo Pitter, MD  Admitting Physician  Lynden Oxford, MD  Admission Diagnosis  CAD (coronary artery disease) [414.00] Chest pain [786.50]  Discharge Diagnosis     Principal Problem:   CHEST PAIN Active Problems:   HYPERLIPIDEMIA   HYPERTENSION   CORONARY ARTERY DISEASE   CKD (chronic kidney disease) stage 2, GFR 60-89 ml/min   DM (diabetes mellitus)      Past Medical History  Diagnosis Date  . CAD (coronary artery disease) 2009    Multivessel s/p PCI w/DES 2009 and CABG 2011  . HTN (hypertension)   . Type II or unspecified type diabetes mellitus without mention of complication, not stated as uncontrolled   . PVD (peripheral vascular disease)   . Diverticulosis of colon   . Morbid obesity   . Gout   . Depression   . Osteoarthritis   . Osteopenia   . Hyperlipidemia   . LBP (low back pain)     Lumbar disc disease/lumbar spinal stenosis  . Disc disease, degenerative, cervical   . History of thrombocytopenia   . Anxiety   . GERD (gastroesophageal reflux disease)   . Barrett esophagus   . Gastroparesis   . Helicobacter pylori gastritis   . Allergic rhinitis   . Anemia   . Myocardial infarction   . Hypothyroidism   . H/O hiatal hernia   . Sick sinus syndrome     Dual-chamber PPM implant 02/2012  . CKD (chronic kidney disease), stage II     GFR 60-89 ml/min    Past Surgical History  Procedure Laterality Date  . Coronary stent placement      Drug-eluting stent to the left anterior descending, circumflex and right coronary artery in Jan 2009  . Cholecystectomy    . Abdominal hysterectomy    . Tubal ligation    . Ovarian cyst removal    . Coronary artery bypass graft  2011    LIMA-LAD, SVG-DIAG, SVG-OM1-OM2, SVG-PDA   . Eye surgery      BIL CATARACT REMOVAL 06/2010  . Shoulder arthroscopy  06/16/2011    Procedure: ARTHROSCOPY SHOULDER;  Surgeon: Kennieth Rad, MD;  Location: [facility] OR;  Service: Orthopedics;  Laterality: Left;  LEFT SHOULDER ARTHROSCOPY ACROMIALPLASTY, POSSIBLE MINI OPEN CUFF REPAIR   . Cardiac catheterization      2011  DR COOPER (APPT NEXT WEEK)  . Pacemaker insertion  03/06/12    MDT Adapta L implanted by Dr Johney Frame for SSS     Recommendations for primary care physician for things to follow:      Discharge Diagnoses:   Principal Problem:   CHEST PAIN Active Problems:   HYPERLIPIDEMIA   HYPERTENSION   CORONARY ARTERY DISEASE   CKD (chronic kidney disease) stage 2, GFR 60-89 ml/min   DM (diabetes mellitus)    Discharge Condition: Stable   Diet recommendation:  See Discharge Instructions below   Consults Cardiology    History of present illness and  Hospital Course:     Kindly see H&P for history of present illness and admission details, please review complete Labs, Consult reports and Test reports for all details in brief [name], is a 79 y.o. female, patient with history of CAD,DM-2,Dyslipidemia, admitted for atypical chest pain, ruled out for MI, was seen by Cardiology and cleared for Home DC, she will resume all her home meds for secondary prevention and for her DM-2,HTN unchanged.         Today   Subjective:   [name] today has no headache,no chest abdominal pain,no new weakness tingling or numbness, feels much better wants to go home today.    Objective:   Blood pressure 153/58, pulse 63, temperature 97.6 F (36.4 C), temperature source Oral, resp. rate 18, height 5\' 7"  (1.702 m), weight 103.103 kg (227 lb 4.8 oz), SpO2 100.00%.   Intake/Output Summary (Last 24 hours) at 10/29/12 1447 Last data filed at 10/29/12 1447  Gross per 24 hour  Intake    840 ml  Output      0 ml  Net    840 ml    Exam Awake Alert, Oriented *3, No new F.N  deficits, Normal affect [place].AT,PERRAL Supple Neck,No JVD, No cervical lymphadenopathy appriciated.  Symmetrical Chest wall movement, Good air movement bilaterally, CTAB RRR,No Gallops,Rubs or new Murmurs, No Parasternal Heave +ve B.Sounds, Abd Soft, Non tender, No organomegaly appriciated, No rebound -guarding or rigidity. No Cyanosis, Clubbing or edema, No new Rash or bruise  Data Review   Major procedures and Radiology Reports - PLEASE review detailed and final reports for all details in brief -       Dg Chest 2 View  10/28/2012   *RADIOLOGY REPORT*  Clinical Data: Chest pain  CHEST - 2 VIEW  Comparison: Prior radiograph from 03/07/2012  Findings: Left-sided dual lead transvenous pacemaker / AICD is stable position with electrodes overlying the right atrium and ventricle.  Median sternotomy wires of underlying cavity markers are again noted.  Heart size is stable, and remains within normal limits.  The lungs are hypoinflated.  Minimal linear opacity at the left lung bases is most consistent with subsegmental atelectasis.  There is no airspace consolidation, pleural effusion, or pulmonary edema. No pneumothorax.  No acute osseous abnormality.  IMPRESSION:  Hypoinflation with mild left basilar atelectasis.  No acute cardiopulmonary process.   Original Report Authenticated By: Rise Mu, M.D.    Micro Results      No results found for this or any previous visit (from the past 240 hour(s)).   CBC w Diff: Lab Results  Component Value Date   WBC 8.3 10/29/2012   HGB 12.4 10/29/2012   HCT 37.7 10/29/2012   PLT 114* 10/29/2012   LYMPHOPCT 22 03/04/2012   MONOPCT 5 03/04/2012   EOSPCT 5 03/04/2012   BASOPCT 0 03/04/2012    CMP: Lab Results  Component Value Date   NA 140 10/28/2012   K 4.0 10/28/2012   CL 102 10/28/2012   CO2 29 10/28/2012   BUN 31* 10/28/2012   CREATININE 1.04 10/29/2012   PROT 6.9 03/04/2012   ALBUMIN 2.9* 03/06/2012   BILITOT 0.2* 03/04/2012   ALKPHOS 125*  03/04/2012   AST 18 03/04/2012   ALT 13 03/04/2012  .   Discharge Instructions     Follow with Primary MD Geraldo Pitter, MD in 7 days  Get CBC, CMP, checked 7 days by Primary MD and again as instructed by your Primary MD.    Get Medicines reviewed and adjusted.  Please request your Prim.MD to go over all Hospital Tests and Procedure/Radiological results at the follow up, please get all Hospital records sent to your Prim MD by signing hospital release before you go home.  Activity: As tolerated with Full fall precautions use walker/cane & assistance as needed   Diet:  Cardiac - Low Carb  For Heart failure patients - Check your Weight same time everyday, if you gain over 2 pounds, or you develop in leg swelling, experience more shortness of breath or chest pain, call your Primary MD immediately. Follow Cardiac Low Salt Diet and 1.8 lit/day fluid restriction.  Disposition Home   If you experience worsening of your admission symptoms, develop shortness of breath, life threatening emergency, suicidal or homicidal thoughts you must seek medical attention immediately by calling 911 or calling your MD immediately  if symptoms less severe.  You Must read complete instructions/literature along with all the possible adverse reactions/side effects for all the Medicines you take and that have been prescribed to you. Take any new Medicines after you have completely understood and accpet all the possible adverse reactions/side effects.   Do not drive and provide baby sitting services if your were admitted for syncope or siezures until you have seen by Primary MD or a Neurologist and advised to do so again.  Do not drive when taking Pain medications.    Do not take more than prescribed Pain, Sleep and Anxiety Medications  Special Instructions: If you have smoked or chewed Tobacco  in the last 2 yrs please stop smoking, stop any regular Alcohol  and or any Recreational drug use.  Wear Seat  belts while driving.   Please note  You were cared for by a hospitalist during your hospital stay. If you have any questions about your discharge medications or the care you received while you were in the hospital after you are discharged, you can call the unit and asked to speak with the hospitalist on call if the hospitalist that took care of you is not available. Once you are discharged, your primary care physician will handle any further medical issues. Please note that NO REFILLS for any discharge medications will be authorized once you are discharged, as it is imperative that you return to your primary care physician (or establish a relationship with a primary care physician if you do not have one) for your aftercare needs so that they can reassess your need for medications and monitor your lab values.    Follow-up Information   Follow up with Geraldo Pitter, MD. Schedule an appointment as soon as possible for a visit in 1 week.   Contact information:   [address] [place] [address] [phone]       Follow up with Tonny Bollman, MD. Schedule an appointment as soon as possible for a visit in 1 week.   Contact information:   [address] [place] [address] [phone]         Discharge Medications     Medication List         acetaminophen 500 MG tablet  Commonly known as:  TYLENOL  Take 500 mg by mouth 2 (two) times daily as needed for pain.     allopurinol 100 MG tablet  Commonly known as:  ZYLOPRIM  Take 100 mg by  mouth daily.     aspirin 81 MG chewable tablet  Chew 81 mg by mouth every evening.     bumetanide 1 MG tablet  Commonly known as:  BUMEX  Take 3 mg by mouth daily.     chlorthalidone 25 MG tablet  Commonly known as:  HYGROTON  Take 1 tablet (25 mg total) by mouth daily.     clopidogrel 75 MG tablet  Commonly known as:  PLAVIX  Take 75 mg by mouth daily.     hydrALAZINE 25 MG tablet  Commonly known as:   APRESOLINE  Take 1 tablet (25 mg total) by mouth every 8 (eight) hours.     insulin NPH 100 UNIT/ML injection  Commonly known as:  HUMULIN N,NOVOLIN N  Inject 10-45 Units into the skin 2 (two) times daily at 8 am and 10 pm. 45 units each morning, and 10 units in the evening     iron polysaccharides 150 MG capsule  Commonly known as:  NIFEREX  Take 150 mg by mouth daily.     JANUMET PO  Take by mouth.     levothyroxine 100 MCG tablet  Commonly known as:  SYNTHROID, LEVOTHROID  Take 1 tablet (100 mcg total) by mouth daily.     losartan 100 MG tablet  Commonly known as:  COZAAR  Take 1 tablet (100 mg total) by mouth daily.     nitroGLYCERIN 0.4 MG SL tablet  Commonly known as:  NITROSTAT  Place 0.4 mg under the tongue every 5 (five) minutes as needed for chest pain.     omeprazole 20 MG capsule  Commonly known as:  PRILOSEC  Take 20 mg by mouth 2 (two) times daily.     pravastatin 40 MG tablet  Commonly known as:  PRAVACHOL  Take 40 mg by mouth daily.     PRESCRIPTION MEDICATION  Take 2 tablets by mouth every evening. Pt. On a study medication through [facility] cardiology           Total Time in preparing paper work, data evaluation and todays exam - 35 minutes  Leroy Sea M.D on 10/29/2012 at 2:47 PM  [facility] Office  [phone]

## 2012-10-29 NOTE — ED Provider Notes (Signed)
Medical screening examination/treatment/procedure(s) were conducted as a shared visit with non-physician practitioner(s) and myself.  I personally evaluated the patient during the encounter  79 yo female with prior CABG presenting with chest pain.  Well appearing.  Heart sounds normal.  Lungs clear to auscultation.  Initial cardiac workup reassuring.  Plan to admit to rule out ACS given her elevated risk.    Candyce Churn, MD 10/29/12 [phone]

## 2012-10-29 NOTE — Consult Note (Addendum)
CARDIOLOGY CONSULT NOTE   Patient ID: [name] MRN: [id number] DOB/AGE: 09-08-1932 79 y.o.  Admit date: 10/28/2012  Primary Physician   Geraldo Pitter, MD Primary Cardiologist   Tonny Bollman, MD Primary EP Card: Hillis Range, MD Reason for Consultation  Chest pain  [name] is a 79 y.o. female with a history of CAD, sick sinus syndrome, HTN, DM, PVD, CKD stage II and hyperlipidemia. DES to LAD, Cx, and RCA 04/2007. CABG 02/2010. Dual-chamber PPM 02/2012.   She presented to the ED yesterday evening c/o intermittent sharp, stabbing chest pain that began Saturday. Pain began Saturday without exertion and resolved without intervention. She was OK yesterday am but the pain woke her up from a nap yesterday afternoon. The pain was located deep to left breast and did not radiate to neck/jaw/arm. Severity 7-8/10 at it's worst. Each episode lasted "a few minutes" but later pt stated the pain lasted hours. Pain did not change with deep breathing or movement, and she did not try meds to alleviate the pain. Accompanied by nausea and lightheadedness; denies SOB, diaphoresis, abdominal pain, vomiting. Patient reports this is completely different from anginal pain she has experienced in the past.   Today she is pain-free. No episodes of chest pain experienced since last night. Patient expresses a wish to be discharged. She has not noticed any change in her ability to ambulate and no change in DOE. Activity level at baseline is poor as she walks with a cane.    Past Medical History  Diagnosis Date  . CAD (coronary artery disease) 2009    Multivessel s/p PCI w/DES 2009 and CABG 2011  . HTN (hypertension)   . Type II or unspecified type diabetes mellitus without mention of complication, not stated as uncontrolled   . PVD (peripheral vascular disease)   . Diverticulosis of colon   . Morbid obesity   . Gout   . Depression   . Osteoarthritis   . Osteopenia   . Hyperlipidemia   . LBP  (low back pain)     Lumbar disc disease/lumbar spinal stenosis  . Disc disease, degenerative, cervical   . History of thrombocytopenia   . Anxiety   . GERD (gastroesophageal reflux disease)   . Barrett esophagus   . Gastroparesis   . Helicobacter pylori gastritis   . Allergic rhinitis   . Anemia   . Myocardial infarction   . Hypothyroidism   . H/O hiatal hernia   . Sick sinus syndrome     Dual-chamber PPM implant 02/2012  . CKD (chronic kidney disease), stage II     GFR 60-89 ml/min     Past Surgical History  Procedure Laterality Date  . Coronary stent placement      Drug-eluting stent to the left anterior descending, circumflex and right coronary artery in Jan 2009  . Cholecystectomy    . Abdominal hysterectomy    . Tubal ligation    . Ovarian cyst removal    . Coronary artery bypass graft  2011    LIMA-LAD, SVG-DIAG, SVG-OM1-OM2, SVG-PDA  . Eye surgery      BIL CATARACT REMOVAL 06/2010  . Shoulder arthroscopy  06/16/2011    Procedure: ARTHROSCOPY SHOULDER;  Surgeon: Kennieth Rad, MD;  Location: [facility] OR;  Service: Orthopedics;  Laterality: Left;  LEFT SHOULDER ARTHROSCOPY ACROMIALPLASTY, POSSIBLE MINI OPEN CUFF REPAIR   . Cardiac catheterization      2011  DR COOPER (APPT NEXT WEEK)  . Pacemaker insertion  03/06/12  MDT Adapta L implanted by Dr Johney Frame for SSS   Allergies  Allergen Reactions  . Ciprofloxacin Nausea And Vomiting    syncope  . Codeine Other (See Comments)    HALLUCINATIONS  . Diltiazem Hcl Other (See Comments)    : low heart rate  . Hydrocodone Other (See Comments)    Makes her pass out  . Lovastatin Other (See Comments)    Pt doesn't remember a reaction  . Metformin Other (See Comments)    diarrhea  . Penicillins Hives  . Shellfish Allergy Hives  . Sulfonamide Derivatives Nausea And Vomiting    I have reviewed the patient's current medications . allopurinol  100 mg Oral Daily  . aspirin  81 mg Oral QPM  . bumetanide  3 mg Oral Daily  .  carvedilol  3.125 mg Oral BID WC  . chlorthalidone  25 mg Oral Daily  . clopidogrel  75 mg Oral Daily  . heparin  5,000 Units Subcutaneous Q8H  . hydrALAZINE  25 mg Oral Q8H  . insulin aspart  0-15 Units Subcutaneous TID WC  . insulin aspart  0-5 Units Subcutaneous QHS  . insulin NPH  10 Units Subcutaneous QHS  . insulin NPH  45 Units Subcutaneous QAC breakfast  . iron polysaccharides  150 mg Oral Daily  . levothyroxine  100 mcg Oral QAC breakfast  . losartan  100 mg Oral Daily  . pantoprazole  40 mg Oral Daily  . simvastatin  40 mg Oral q1800     acetaminophen, acetaminophen, hydrALAZINE, nitroGLYCERIN, nitroGLYCERIN, ondansetron (ZOFRAN) IV  Prior to Admission medications   Medication Sig Start Date End Date Taking? Authorizing Provider  acetaminophen (TYLENOL) 500 MG tablet Take 500 mg by mouth 2 (two) times daily as needed for pain.    Yes Historical Provider, MD  allopurinol (ZYLOPRIM) 100 MG tablet Take 100 mg by mouth daily.   Yes Historical Provider, MD  aspirin 81 MG chewable tablet Chew 81 mg by mouth every evening.    Yes Historical Provider, MD  bumetanide (BUMEX) 1 MG tablet Take 3 mg by mouth daily.   Yes Historical Provider, MD  chlorthalidone (HYGROTON) 25 MG tablet Take 1 tablet (25 mg total) by mouth daily. 03/08/12  Yes Penny Pia, MD  clopidogrel (PLAVIX) 75 MG tablet Take 75 mg by mouth daily.   Yes Historical Provider, MD  hydrALAZINE (APRESOLINE) 25 MG tablet Take 1 tablet (25 mg total) by mouth every 8 (eight) hours. 03/08/12  Yes Penny Pia, MD  insulin NPH (HUMULIN N,NOVOLIN N) 100 UNIT/ML injection Inject 10-45 Units into the skin 2 (two) times daily at 8 am and 10 pm. 45 units each morning, and 10 units in the evening 03/08/12  Yes Penny Pia, MD  iron polysaccharides (NIFEREX) 150 MG capsule Take 150 mg by mouth daily.     Yes Historical Provider, MD  levothyroxine (SYNTHROID, LEVOTHROID) 100 MCG tablet Take 1 tablet (100 mcg total) by mouth daily.  03/13/12  Yes Romero Belling, MD  losartan (COZAAR) 100 MG tablet Take 1 tablet (100 mg total) by mouth daily. 08/01/12  Yes Tonny Bollman, MD  nitroGLYCERIN (NITROSTAT) 0.4 MG SL tablet Place 0.4 mg under the tongue every 5 (five) minutes as needed for chest pain.    Yes Historical Provider, MD  omeprazole (PRILOSEC) 20 MG capsule Take 20 mg by mouth 2 (two) times daily.    Yes Historical Provider, MD  pravastatin (PRAVACHOL) 40 MG tablet Take 40 mg by mouth daily.  Yes Historical Provider, MD  PRESCRIPTION MEDICATION Take 2 tablets by mouth every evening. Pt. On a study medication through [facility] cardiology   Yes Historical Provider, MD  SitaGLIPtin-MetFORMIN HCl (JANUMET PO) Take by mouth.   Yes Historical Provider, MD     History   Social History  . Marital Status: Widowed    Spouse Name: N/A    Number of Children: N/A  . Years of Education: N/A   Occupational History  . RETIRED LPN    Social History Main Topics  . Smoking status: Former Games developer  . Smokeless tobacco: Never Used     Comment: quit 30 yrs ago  . Alcohol Use: No  . Drug Use: No  . Sexually Active: Not on file   Other Topics Concern  . Not on file   Social History Narrative   Widowed 2004.., Lives alone..Family history is negative for premature coronary artery disease. Mother died at age 54 with heart disease in her later years, father died at age 17 from a stroke.Marland KitchenShe  has 8 siblings, none of whom have coronary artery disease.          Family Status  Relation Status Death Age  . Mother Deceased     Old Age  . Father Deceased    Family History  Problem Relation Age of Onset  . Diabetes Mother   . Hypertension Mother   . Stroke Father   . Coronary artery disease Other      ROS:  Full 14 point review of systems complete and found to be negative unless listed above.  Physical Exam: Blood pressure 153/58, pulse 63, temperature 97.6 F (36.4 C), temperature source Oral, resp. rate 18, height 5\' 7"  (1.702  m), weight 227 lb 4.8 oz (103.103 kg), SpO2 100.00%.  General: Well developed, well nourished, female in no acute distress. Lungs: No increase in respiratory effort. CTAB.  Heart: HRRR S1 S2, no rub/gallop. DPs 1+ bilaterally. Radials 2+ bilaterally Neck: No carotid bruits. JVP approx 9cm. Abdomen: Bowel sounds present, abdomen soft and non-tender without masses or hernias noted. Extremities: LE skin warm and dry. No clubbing or cyanosis. Mild 1+ edema bilaterally, R>L.  Neuro: Alert and oriented X 3. No focal deficits noted. Psych:  Good affect, responds appropriately.  Labs:   Lab Results  Component Value Date   WBC 8.3 10/29/2012   HGB 12.4 10/29/2012   HCT 37.7 10/29/2012   MCV 90.6 10/29/2012   PLT 114* 10/29/2012    Recent Labs  10/29/12 0117  INR 1.04     Recent Labs Lab 10/28/12 1930 10/29/12 0530  NA 140  --   K 4.0  --   CL 102  --   CO2 29  --   BUN 31*  --   CREATININE 1.04 1.04  CALCIUM 9.4  --   GLUCOSE 179*  --     Recent Labs  10/29/12 10/29/12 0530 10/29/12 1050  TROPONINI <0.30 <0.30 <0.30    Recent Labs  10/28/12 2007  TROPIPOC 0.01   Pro B Natriuretic peptide (BNP)  Date/Time Value Range Status  10/28/2012  7:30 PM 381.5  0 - 450 pg/mL Final  11/29/2010 11:05 PM 151.2  0 - 450 pg/mL Final   Echo: 03/06/2012 -Normal LV size and systolic function, EF 55-60%.  -Moderate diastolic dysfunction.  -The RV was poorly visualized but probably mildly dilated with normal systolic function. -Mild pulmonary hypertension.  ECG:  10/28/2012 Atrial paced regular rhythm. No acute ischemic changes.  Radiology:  Dg Chest 2 View 10/28/2012   *RADIOLOGY REPORT*  Clinical Data: Chest pain  CHEST - 2 VIEW Comparison: Prior radiograph from 03/07/2012  Findings: Left-sided dual lead transvenous pacemaker / AICD is stable position with electrodes overlying the right atrium and ventricle.  Median sternotomy wires of underlying cavity markers are again noted.  Heart  size is stable, and remains within normal limits.  The lungs are hypoinflated.  Minimal linear opacity at the left lung bases is most consistent with subsegmental atelectasis.  There is no airspace consolidation, pleural effusion, or pulmonary edema. No pneumothorax.  No acute osseous abnormality. IMPRESSION:  Hypoinflation with mild left basilar atelectasis.  No acute cardiopulmonary process.   Original Report Authenticated By: Rise Mu, M.D.    ASSESSMENT AND PLAN:    1. Chest pain:  79 y.o. female with a history of CAD, sick sinus syndrome, HTN, DM, PVD, and hyperlipidemia. DES to LAD, Cx, and RCA 04/2007. CABG 02/2010. Dual-chamber PPM 02/2012. Patient presented to ED yesterday c/o of sharp, stabbing intermittent chest pain that she describes feels completely different from anginal pain she has experienced in the past. Currently pain-free. Negative troponins. Continue medical therapy for underlying CAD: ASA, plavix, losartan ,and pravastatin. Stable for discharge from cardiac standpoint. F/u with Dr. Excell Seltzer in office.   2. HTN: Blood pressure is not well-controlled. Currently taking thiazide diuretic (chlorthalidone), loop diuretic (bumetanide), ARB (losartan), and vasodilator (hydralazine) at home. Consider addition of beta blocker or calcium channel blocker (norvasc).    3. Hyperlipidemia: Continue pravastatin as prescribed.   4. DM: Internal medicine to follow.   5. CKD stage II: Creatinine and GFR unchanged. Internal medicine to follow.   6. SSS: ECG is A-paced, T wave inversions noted, MD to review.   Signed: Nyra Jabs, PA-S2  Theodore Demark, PA-C 10/29/2012 1:37 PM Beeper [phone] Patient seen and examined. I agree with the assessment and plan as detailed above. See also my additional thoughts below.   There is no evidence of active ischemia at this time. I have reviewed all of the data completely and I have examined the patient. No further cardiac workup is needed in  the hospital. She can be discharged home.  Willa Rough, MD, [facility] 10/29/2012 4:38 PM   Co-Sign MD

## 2012-10-29 NOTE — Progress Notes (Signed)
Pt requesting to d/c home; MD paged at this time; no d/c orders entered at this time; will await callback.

## 2012-10-29 NOTE — H&P (Addendum)
[facility] History and Physical  [name]  WJX:[id number]  DOB: 17-Mar-1933  DOA: 10/28/2012  Referring physician: Raymon Mutton PCP: Geraldo Pitter, MD   Chief Complaint: chest pain  [name] is a 79 y.o. female with Past medical history of coronary artery disease, hypertension, PVD, hyperlipidemia for which she is on a research trial medication presents with the complaint of chest pain that started today. The patient was asymptomatic before 2 day's. Yesterday she had an episode of chest pain that was located on the left side, sharp in nature , nonradiating , reproducible, woke her up from sleep, like stabbing. The pain does not resemble her prior heart attacks. The pain resolved on its own. And then she had a due to episodes of chest pain throughout the day today and therefore she came to the ER. She was home hypertensive in the ER. In mentions her blood pressure at home has been running good. She mentions she is compliant with all the medications. Denies any dizziness, lightheadedness, nausea, leg swelling worsening shortness of breath, cough.  Review of Systems: as mentioned in the history of present illness.  A Comprehensive review of the other systems is negative.  Past Medical History  Diagnosis Date  . CAD (coronary artery disease)     Multivessel s/p PCI w/DES 2008 and CABG 2011  . HTN (hypertension)   . Type II or unspecified type diabetes mellitus without mention of complication, not stated as uncontrolled   . PVD (peripheral vascular disease)   . Diverticulosis of colon   . Morbid obesity   . Gout   . Depression   . Osteoarthritis   . Osteopenia   . Hyperlipidemia   . LBP (low back pain)     Lumbar disc disease/lumbar spinal stenosis  . Disc disease, degenerative, cervical   . History of thrombocytopenia   . Anxiety   . GERD (gastroesophageal reflux disease)   . Barrett esophagus   . Gastroparesis   . Helicobacter pylori gastritis   .  Allergic rhinitis   . Anemia   . Myocardial infarction   . Hypothyroidism   . H/O hiatal hernia   . Sick sinus syndrome     PPM implant   Past Surgical History  Procedure Laterality Date  . Coronary stent placement      Drug-eluting stent to the left anterior descending, circumflex and right coronary artery in Jan 2009  . Cholecystectomy    . Abdominal hysterectomy    . Tubal ligation    . Ovarian cyst removal    . Coronary stent placement      x 3 Dr Excell Seltzer  . Coronary artery bypass graft      02/2010  . Eye surgery      BIL CATARACT REMOVAL 06/2010  . Shoulder arthroscopy  06/16/2011    Procedure: ARTHROSCOPY SHOULDER;  Surgeon: Kennieth Rad, MD;  Location: [facility] OR;  Service: Orthopedics;  Laterality: Left;  LEFT SHOULDER ARTHROSCOPY ACROMIALPLASTY, POSSIBLE MINI OPEN CUFF REPAIR   . Cardiac catheterization      2011  DR COOPER (APPT NEXT WEEK)  . Pacemaker insertion  03/06/12    MDT Adapta L implanted by Dr Johney Frame for SSS   Social History:  reports that she has quit smoking. She has never used smokeless tobacco. She reports that she does not drink alcohol or use illicit drugs. Patient is coming from home. Patient can participate in ADLs.  Allergies  Allergen Reactions  . Ciprofloxacin Nausea  And Vomiting    syncope  . Codeine Other (See Comments)    HALLUCINATIONS  . Diltiazem Hcl Other (See Comments)    : low heart rate  . Hydrocodone Other (See Comments)    Makes her pass out  . Lovastatin Other (See Comments)    Pt doesn't remember a reaction  . Metformin Other (See Comments)    diarrhea  . Penicillins Hives  . Shellfish Allergy Hives  . Sulfonamide Derivatives Nausea And Vomiting    Family History  Problem Relation Age of Onset  . Diabetes Mother   . Hypertension Mother   . Stroke Father   . Coronary artery disease Other     Prior to Admission medications   Medication Sig Start Date End Date Taking? Authorizing Provider  acetaminophen (TYLENOL) 500  MG tablet Take 500 mg by mouth 2 (two) times daily as needed for pain.    Yes Historical Provider, MD  allopurinol (ZYLOPRIM) 100 MG tablet Take 100 mg by mouth daily.   Yes Historical Provider, MD  aspirin 81 MG chewable tablet Chew 81 mg by mouth every evening.    Yes Historical Provider, MD  bumetanide (BUMEX) 1 MG tablet Take 3 mg by mouth daily.   Yes Historical Provider, MD  chlorthalidone (HYGROTON) 25 MG tablet Take 1 tablet (25 mg total) by mouth daily. 03/08/12  Yes Penny Pia, MD  clopidogrel (PLAVIX) 75 MG tablet Take 75 mg by mouth daily.   Yes Historical Provider, MD  hydrALAZINE (APRESOLINE) 25 MG tablet Take 1 tablet (25 mg total) by mouth every 8 (eight) hours. 03/08/12  Yes Penny Pia, MD  insulin NPH (HUMULIN N,NOVOLIN N) 100 UNIT/ML injection Inject 10-45 Units into the skin 2 (two) times daily at 8 am and 10 pm. 45 units each morning, and 10 units in the evening 03/08/12  Yes Penny Pia, MD  iron polysaccharides (NIFEREX) 150 MG capsule Take 150 mg by mouth daily.     Yes Historical Provider, MD  levothyroxine (SYNTHROID, LEVOTHROID) 100 MCG tablet Take 1 tablet (100 mcg total) by mouth daily. 03/13/12  Yes Romero Belling, MD  losartan (COZAAR) 100 MG tablet Take 1 tablet (100 mg total) by mouth daily. 08/01/12  Yes Tonny Bollman, MD  nitroGLYCERIN (NITROSTAT) 0.4 MG SL tablet Place 0.4 mg under the tongue every 5 (five) minutes as needed for chest pain.    Yes Historical Provider, MD  omeprazole (PRILOSEC) 20 MG capsule Take 20 mg by mouth 2 (two) times daily.    Yes Historical Provider, MD  pravastatin (PRAVACHOL) 40 MG tablet Take 40 mg by mouth daily.   Yes Historical Provider, MD  PRESCRIPTION MEDICATION Take 2 tablets by mouth every evening. Pt. On a study medication through [facility] cardiology   Yes Historical Provider, MD  SitaGLIPtin-MetFORMIN HCl (JANUMET PO) Take by mouth.   Yes Historical Provider, MD    Physical Exam: Filed Vitals:   10/28/12 2330 10/29/12 0000  10/29/12 0100 10/29/12 0234  BP: 178/56 160/88 177/63   Pulse: 61 55 69   Temp:      TempSrc:    Oral  Resp: 18 22 19    Height:    5\' 7"  (1.702 m)  Weight:    103.103 kg (227 lb 4.8 oz)  SpO2: 100% 100% 100%     General: Alert, Awake and Oriented to Time, Place and Person. Appear in mild distress Eyes: PERRL ENT: Oral Mucosa clear moist. Neck: no  JVD,  no Carotid Bruits,  no  Stiffness Cardiovascular: S1 and S2 Present, Murmur  present in aortic area , Peripheral Pulses Present Respiratory: Clear to Auscultation , Bilateral Air entry equal and Decrease Abdomen: Bowel Sound Present, Soft and Non tender  Skin: no decubitus Ulcer Extremities: Pedal edema bilaterally present , calf tenderness  absent . Neurologic: Mental status, Motor strength, Sensation, reflexes, Proprioception Grossly Unremarkable.  Labs on Admission:  Basic Metabolic Panel:  Recent Labs Lab 10/28/12 1930  NA 140  K 4.0  CL 102  CO2 29  GLUCOSE 179*  BUN 31*  CREATININE 1.04  CALCIUM 9.4   Liver Function Tests: No results found for this basename: AST, ALT, ALKPHOS, BILITOT, PROT, ALBUMIN,  in the last 168 hours No results found for this basename: LIPASE, AMYLASE,  in the last 168 hours No results found for this basename: AMMONIA,  in the last 168 hours CBC:  Recent Labs Lab 10/28/12 1930  WBC 8.4  HGB 12.5  HCT 36.3  MCV 90.5  PLT 127*   Cardiac Enzymes:  Recent Labs Lab 10/29/12  TROPONINI <0.30    BNP (last 3 results)  Recent Labs  10/28/12 1930  PROBNP 381.5   CBG:  Recent Labs Lab 10/28/12 2347  GLUCAP 89    Radiological Exams on Admission: Dg Chest 2 View  10/28/2012   *RADIOLOGY REPORT*  Clinical Data: Chest pain  CHEST - 2 VIEW  Comparison: Prior radiograph from 03/07/2012  Findings: Left-sided dual lead transvenous pacemaker / AICD is stable position with electrodes overlying the right atrium and ventricle.  Median sternotomy wires of underlying cavity markers are  again noted.  Heart size is stable, and remains within normal limits.  The lungs are hypoinflated.  Minimal linear opacity at the left lung bases is most consistent with subsegmental atelectasis.  There is no airspace consolidation, pleural effusion, or pulmonary edema. No pneumothorax.  No acute osseous abnormality.  IMPRESSION:  Hypoinflation with mild left basilar atelectasis.  No acute cardiopulmonary process.   Original Report Authenticated By: Rise Mu, M.D.    EKG: Independently reviewed. no ST-T wave changes suggestive of acute ischemia. T inversion secondary to pacemaker   Assessment/Plan Principal Problem:   CHEST PAIN Active Problems:   HYPERLIPIDEMIA   HYPERTENSION   CORONARY ARTERY DISEASE   CKD (chronic kidney disease) stage 2, GFR 60-89 ml/min   DM (diabetes mellitus)   1. Chest pain Atypical in nature. But she has a significant history of coronary artery disease and other cardiovascular disease. Considering that we would admit her for observation. I will get serial troponins, EKG in the morning, monitor her on telemetry. Continue aspirin and Plavix   2. Accelerated Hypertension Continue her home medication, Hydralazine as needed for control  3. Hyperlipidemia Continue statin. May need to discuss with cardiology about research trial medication  4.  Diabetes Insulin sliding scale, and continue Lantus  DVT Prophylaxis: Heparin Nutrition: Cardiac diet  Code Status: Full  Family Communication: grand son was at bedside   Author: Lynden Oxford, MD [facility] Pager: [phone] 10/29/2012 2:43 AM    If 7PM-7AM, please contact night-coverage [email or web address] Password [email or web address]

## 2012-10-30 ENCOUNTER — Other Ambulatory Visit: Payer: Self-pay | Admitting: *Deleted

## 2012-10-30 MED ORDER — NITROGLYCERIN 0.4 MG SL SUBL: 0.4000 mg | SUBLINGUAL_TABLET | SUBLINGUAL | Status: DC | PRN

## 2012-10-31 NOTE — ED Provider Notes (Signed)
Medical screening examination/treatment/procedure(s) were conducted as a shared visit with non-physician practitioner(s) and myself.  I personally evaluated the patient during the encounter.   Please see my separate note.    Candyce Churn, MD 10/31/12 1049

## 2012-11-07 ENCOUNTER — Encounter: Payer: Self-pay | Admitting: Cardiovascular Disease

## 2012-11-07 ENCOUNTER — Ambulatory Visit (INDEPENDENT_AMBULATORY_CARE_PROVIDER_SITE_OTHER): Payer: Medicare Other | Admitting: Cardiovascular Disease

## 2012-11-07 VITALS — BP 130/60 | HR 63 | Ht 67.0 in | Wt 226.0 lb

## 2012-11-07 DIAGNOSIS — I2510 Atherosclerotic heart disease of native coronary artery without angina pectoris: Secondary | ICD-10-CM

## 2012-11-07 NOTE — Patient Instructions (Addendum)
Your physician recommends that you continue on your current medications as directed. Please refer to the Current Medication list given to you today.  Your physician wants you to follow-up in: 6 MONTHS with Dr Cooper.  You will receive a reminder letter in the mail two months in advance. If you don't receive a letter, please call our office to schedule the follow-up appointment.  

## 2012-11-09 ENCOUNTER — Encounter: Payer: Self-pay | Admitting: Cardiovascular Disease

## 2012-11-09 NOTE — Progress Notes (Signed)
HPI:  79 year old woman presenting for followup evaluation. The patient has been followed for coronary artery disease status post CABG in 2011. She underwent permanent pacemaker placement in 2013.  She was hospitalized last week with chest pain. She described as a sharp, stabbing pain over the left chest. This was different from the pain of her angina. She was observed overnight and ruled out for myocardial infarction.  Since returning home from the hospital, she is doing well. She denies recurrence of chest pain. She denies dyspnea, orthopnea, or PND. There has been no lightheadedness, palpitations, or syncope. Her activity level is limited. She has no other complaints today.  Outpatient Encounter Prescriptions as of 11/07/2012  Medication Sig Dispense Refill  . acetaminophen (TYLENOL) 500 MG tablet Take 500 mg by mouth 2 (two) times daily as needed for pain.       Marland Kitchen allopurinol (ZYLOPRIM) 100 MG tablet Take 100 mg by mouth daily.      Marland Kitchen aspirin 81 MG chewable tablet Chew 81 mg by mouth every evening.       . bumetanide (BUMEX) 1 MG tablet Take 3 mg by mouth daily.      . chlorthalidone (HYGROTON) 25 MG tablet Take 1 tablet (25 mg total) by mouth daily.  30 tablet  0  . clopidogrel (PLAVIX) 75 MG tablet Take 75 mg by mouth daily.      . colchicine 0.6 MG tablet Take 0.6 mg by mouth as needed.      . hydrALAZINE (APRESOLINE) 25 MG tablet Take one tablet 3 times daily      . insulin NPH (HUMULIN N,NOVOLIN N) 100 UNIT/ML injection Inject 10-45 Units into the skin 2 (two) times daily at 8 am and 10 pm. 45 units each morning, and 10 units in the evening      . iron polysaccharides (NIFEREX) 150 MG capsule Take 150 mg by mouth daily.        Marland Kitchen levothyroxine (SYNTHROID, LEVOTHROID) 100 MCG tablet Take 1 tablet (100 mcg total) by mouth daily.  30 tablet  0  . losartan (COZAAR) 100 MG tablet Take 1 tablet (100 mg total) by mouth daily.  90 tablet  3  . nitroGLYCERIN (NITROSTAT) 0.4 MG SL tablet Place 1  tablet (0.4 mg total) under the tongue every 5 (five) minutes as needed for chest pain.  25 tablet  3  . omeprazole (PRILOSEC) 20 MG capsule Take 20 mg by mouth 2 (two) times daily.       . pravastatin (PRAVACHOL) 40 MG tablet Take 40 mg by mouth daily.      Marland Kitchen PRESCRIPTION MEDICATION Take 2 tablets by mouth every evening. Pt. On a study medication through [facility] cardiology      . SitaGLIPtin-MetFORMIN HCl (JANUMET PO) Take by mouth.      . [DISCONTINUED] hydrALAZINE (APRESOLINE) 25 MG tablet Take 1 tablet (25 mg total) by mouth every 8 (eight) hours.  42 tablet  0   No facility-administered encounter medications on file as of 11/07/2012.    Allergies  Allergen Reactions  . Ciprofloxacin Nausea And Vomiting    syncope  . Codeine Other (See Comments)    HALLUCINATIONS  . Diltiazem Hcl Other (See Comments)    : low heart rate  . Hydrocodone Other (See Comments)    Makes her pass out  . Lovastatin Other (See Comments)    Pt doesn't remember a reaction  . Metformin Other (See Comments)    diarrhea  . Penicillins Hives  .  Shellfish Allergy Hives  . Sulfonamide Derivatives Nausea And Vomiting    Past Medical History  Diagnosis Date  . CAD (coronary artery disease) 2009    Multivessel s/p PCI w/DES 2009 and CABG 2011  . HTN (hypertension)   . Type II or unspecified type diabetes mellitus without mention of complication, not stated as uncontrolled   . PVD (peripheral vascular disease)   . Diverticulosis of colon   . Morbid obesity   . Gout   . Depression   . Osteoarthritis   . Osteopenia   . Hyperlipidemia   . LBP (low back pain)     Lumbar disc disease/lumbar spinal stenosis  . Disc disease, degenerative, cervical   . History of thrombocytopenia   . Anxiety   . GERD (gastroesophageal reflux disease)   . Barrett esophagus   . Gastroparesis   . Helicobacter pylori gastritis   . Allergic rhinitis   . Anemia   . Myocardial infarction   . Hypothyroidism   . H/O hiatal  hernia   . Sick sinus syndrome     Dual-chamber PPM implant 02/2012  . CKD (chronic kidney disease), stage II     GFR 60-89 ml/min    ROS: Negative except as per HPI  BP 130/60  Pulse 63  Ht 5\' 7"  (1.702 m)  Wt 102.513 kg (226 lb)  BMI 35.39 kg/m2  PHYSICAL EXAM: Pt is alert and oriented, pleasant, obese elderly woman in NAD HEENT: normal Neck: JVP - normal, carotids 2+= without bruits Lungs: CTA bilaterally CV: RRR without murmur or gallop Abd: soft, NT, Positive BS, no hepatomegaly Ext: no C/C/E, distal pulses intact and equal Skin: warm/dry no rash  EKG: Electronic atrial pacemaker 63 beats per minute, T-wave abnormality consider inferolateral ischemia.  ASSESSMENT AND PLAN: 1. CAD status post CABG. I have reviewed her chest x-ray, EKGs, and lab work from the hospital. There is no evidence of myocardial infarction. Her symptoms were atypical. I think she should continue her current medical program without further testing indicated at this time. Her symptoms have completely resolved.  2. Essential hypertension. Blood pressure is well controlled on a combination of losartan and hydralazine.  Tonny Bollman 11/09/2012 5:58 AM

## 2013-02-04 ENCOUNTER — Encounter (INDEPENDENT_AMBULATORY_CARE_PROVIDER_SITE_OTHER): Payer: Medicare Other | Admitting: Ophthalmology

## 2013-02-04 DIAGNOSIS — H35039 Hypertensive retinopathy, unspecified eye: Secondary | ICD-10-CM

## 2013-02-04 DIAGNOSIS — E11319 Type 2 diabetes mellitus with unspecified diabetic retinopathy without macular edema: Secondary | ICD-10-CM

## 2013-02-04 DIAGNOSIS — I10 Essential (primary) hypertension: Secondary | ICD-10-CM

## 2013-02-04 DIAGNOSIS — H43819 Vitreous degeneration, unspecified eye: Secondary | ICD-10-CM

## 2013-02-04 DIAGNOSIS — E1139 Type 2 diabetes mellitus with other diabetic ophthalmic complication: Secondary | ICD-10-CM

## 2013-02-19 ENCOUNTER — Encounter: Payer: Self-pay | Admitting: Endocrinology

## 2013-02-19 ENCOUNTER — Ambulatory Visit (INDEPENDENT_AMBULATORY_CARE_PROVIDER_SITE_OTHER): Payer: Medicare Other | Admitting: Endocrinology

## 2013-02-19 VITALS — BP 140/70 | HR 73 | Temp 98.4°F | Ht 67.0 in | Wt 229.9 lb

## 2013-02-19 DIAGNOSIS — E119 Type 2 diabetes mellitus without complications: Secondary | ICD-10-CM

## 2013-02-19 DIAGNOSIS — E1029 Type 1 diabetes mellitus with other diabetic kidney complication: Secondary | ICD-10-CM

## 2013-02-19 LAB — MICROALBUMIN / CREATININE URINE RATIO
Microalb Creat Ratio: 3.6 mg/g (ref 0.0–30.0)
Microalb, Ur: 9.1 mg/dL — ABNORMAL HIGH (ref 0.0–1.9)

## 2013-02-19 LAB — HEMOGLOBIN A1C: Hgb A1c MFr Bld: 7.5 % — ABNORMAL HIGH (ref 4.6–6.5)

## 2013-02-19 NOTE — Patient Instructions (Signed)
check your blood sugar twice a day.  vary the time of day when you check, between before the 3 meals, and at bedtime.  also check if you have symptoms of your blood sugar being too high or too low.  please keep a record of the readings and bring it to your next appointment here.  You can write it on any piece of paper.  please call us sooner if your blood sugar goes below 70, or if you have a lot of readings over 200. Please come back for a follow-up appointment in 3 months Blood and urine tests are being requested for you today.  We'll contact you with results.

## 2013-02-19 NOTE — Progress Notes (Signed)
Subjective:    Patient ID: [name], female    DOB: 07/21/1932, 79 y.o.   MRN: [id number]  HPI Pt returns for f/u of insulin-requiring DM (dx'ed 1988, when she presented with severe fatigue; complicated by PAD, renal insufficiency, CAD, and gastroparesis).  no cbg record, but states cbg's vary from 98-220.  It is in general higher as the day goes on.  She takes NPH insulin 45 units QAM,and 10 units QPM.  pt states she feels well in general. Past Medical History  Diagnosis Date  . CAD (coronary artery disease) 2009    Multivessel s/p PCI w/DES 2009 and CABG 2011  . HTN (hypertension)   . Type II or unspecified type diabetes mellitus without mention of complication, not stated as uncontrolled   . PVD (peripheral vascular disease)   . Diverticulosis of colon   . Morbid obesity   . Gout   . Depression   . Osteoarthritis   . Osteopenia   . Hyperlipidemia   . LBP (low back pain)     Lumbar disc disease/lumbar spinal stenosis  . Disc disease, degenerative, cervical   . History of thrombocytopenia   . Anxiety   . GERD (gastroesophageal reflux disease)   . Barrett esophagus   . Gastroparesis   . Helicobacter pylori gastritis   . Allergic rhinitis   . Anemia   . Myocardial infarction   . Hypothyroidism   . H/O hiatal hernia   . Sick sinus syndrome     Dual-chamber PPM implant 02/2012  . CKD (chronic kidney disease), stage II     GFR 60-89 ml/min    Past Surgical History  Procedure Laterality Date  . Coronary stent placement      Drug-eluting stent to the left anterior descending, circumflex and right coronary artery in Jan 2009  . Cholecystectomy    . Abdominal hysterectomy    . Tubal ligation    . Ovarian cyst removal    . Coronary artery bypass graft  2011    LIMA-LAD, SVG-DIAG, SVG-OM1-OM2, SVG-PDA  . Eye surgery      BIL CATARACT REMOVAL 06/2010  . Shoulder arthroscopy  06/16/2011    Procedure: ARTHROSCOPY SHOULDER;  Surgeon: Kennieth Rad, MD;  Location: [facility] OR;   Service: Orthopedics;  Laterality: Left;  LEFT SHOULDER ARTHROSCOPY ACROMIALPLASTY, POSSIBLE MINI OPEN CUFF REPAIR   . Cardiac catheterization      2011  DR COOPER (APPT NEXT WEEK)  . Pacemaker insertion  03/06/12    MDT Adapta L implanted by Dr Johney Frame for SSS    History   Social History  . Marital Status: Widowed    Spouse Name: N/A    Number of Children: N/A  . Years of Education: N/A   Occupational History  . RETIRED LPN    Social History Main Topics  . Smoking status: Former Games developer  . Smokeless tobacco: Never Used     Comment: quit 30 yrs ago  . Alcohol Use: No  . Drug Use: No  . Sexual Activity: Not on file   Other Topics Concern  . Not on file   Social History Narrative   Widowed 2004.., Lives alone..Family history is negative for premature coronary artery disease. Mother died at age 5 with heart disease in her later years, father died at age 91 from a stroke.Marland KitchenShe  has 8 siblings, none of whom have coronary artery disease.          Current Outpatient Prescriptions on File Prior to Visit  Medication Sig Dispense Refill  . acetaminophen (TYLENOL) 500 MG tablet Take 500 mg by mouth 2 (two) times daily as needed for pain.       Marland Kitchen allopurinol (ZYLOPRIM) 100 MG tablet Take 100 mg by mouth daily.      Marland Kitchen aspirin 81 MG chewable tablet Chew 81 mg by mouth every evening.       . bumetanide (BUMEX) 1 MG tablet Take 3 mg by mouth daily.      . chlorthalidone (HYGROTON) 25 MG tablet Take 1 tablet (25 mg total) by mouth daily.  30 tablet  0  . clopidogrel (PLAVIX) 75 MG tablet Take 75 mg by mouth daily.      . colchicine 0.6 MG tablet Take 0.6 mg by mouth as needed.      . hydrALAZINE (APRESOLINE) 25 MG tablet Take one tablet 3 times daily      . insulin NPH (HUMULIN N,NOVOLIN N) 100 UNIT/ML injection Inject 10-45 Units into the skin 2 (two) times daily at 8 am and 10 pm. 50 units each morning, and 5 units in the evening      . iron polysaccharides (NIFEREX) 150 MG capsule Take  150 mg by mouth daily.        Marland Kitchen levothyroxine (SYNTHROID, LEVOTHROID) 100 MCG tablet Take 1 tablet (100 mcg total) by mouth daily.  30 tablet  0  . losartan (COZAAR) 100 MG tablet Take 1 tablet (100 mg total) by mouth daily.  90 tablet  3  . nitroGLYCERIN (NITROSTAT) 0.4 MG SL tablet Place 1 tablet (0.4 mg total) under the tongue every 5 (five) minutes as needed for chest pain.  25 tablet  3  . pravastatin (PRAVACHOL) 40 MG tablet Take 40 mg by mouth daily.      Marland Kitchen PRESCRIPTION MEDICATION Take 2 tablets by mouth every evening. Pt. On a study medication through [facility] cardiology      . omeprazole (PRILOSEC) 20 MG capsule Take 20 mg by mouth 2 (two) times daily.       . SitaGLIPtin-MetFORMIN HCl (JANUMET PO) Take by mouth.      . [DISCONTINUED] atorvastatin (LIPITOR) 20 MG tablet Take 20 mg by mouth daily.       No current facility-administered medications on file prior to visit.    Allergies  Allergen Reactions  . Ciprofloxacin Nausea And Vomiting    syncope  . Codeine Other (See Comments)    HALLUCINATIONS  . Diltiazem Hcl Other (See Comments)    : low heart rate  . Hydrocodone Other (See Comments)    Makes her pass out  . Lovastatin Other (See Comments)    Pt doesn't remember a reaction  . Metformin Other (See Comments)    diarrhea  . Penicillins Hives  . Shellfish Allergy Hives  . Sulfonamide Derivatives Nausea And Vomiting    Family History  Problem Relation Age of Onset  . Diabetes Mother   . Hypertension Mother   . Stroke Father   . Coronary artery disease Other     BP 140/70  Pulse 73  Temp(Src) 98.4 F (36.9 C) (Oral)  Ht 5\' 7"  (1.702 m)  Wt 229 lb 14.4 oz (104.282 kg)  BMI 36.00 kg/m2  SpO2 98%  Review of Systems denies hypoglycemia and weight change.      Objective:   Physical Exam VITAL SIGNS:  See vs page GENERAL: no distress  Lab Results  Component Value Date   HGBA1C 7.5* 02/19/2013  Assessment & Plan:  DM: This insulin regimen was  chosen from multiple options, for its simplicity.  The benefits of glycemic control must be weighed against the risks of hypoglycemia.  Based on the pattern of her cbg's, she needs some adjustment in her therapy.  this is the best control this pt should aim for, given this regimen, which does match insulin to her changing needs throughout the day. CAD: in this context, she should avoid hypoglycemia.

## 2013-02-22 ENCOUNTER — Other Ambulatory Visit: Payer: Self-pay | Admitting: *Deleted

## 2013-02-22 MED ORDER — INSULIN NPH (HUMAN) (ISOPHANE) 100 UNIT/ML ~~LOC~~ SUSP: 50.0000 [IU] | Freq: Two times a day (BID) | SUBCUTANEOUS | Status: DC

## 2013-02-26 ENCOUNTER — Other Ambulatory Visit: Payer: Self-pay | Admitting: *Deleted

## 2013-02-26 MED ORDER — INSULIN NPH (HUMAN) (ISOPHANE) 100 UNIT/ML ~~LOC~~ SUSP: 55.0000 [IU] | Freq: Every day | SUBCUTANEOUS | Status: DC

## 2013-02-26 NOTE — Telephone Encounter (Signed)
Medication clarification

## 2013-03-04 ENCOUNTER — Encounter (INDEPENDENT_AMBULATORY_CARE_PROVIDER_SITE_OTHER): Payer: Medicare Other | Admitting: Ophthalmology

## 2013-03-14 ENCOUNTER — Encounter (INDEPENDENT_AMBULATORY_CARE_PROVIDER_SITE_OTHER): Payer: Medicare Other | Admitting: Ophthalmology

## 2013-03-18 ENCOUNTER — Other Ambulatory Visit (HOSPITAL_COMMUNITY): Payer: Self-pay

## 2013-03-19 ENCOUNTER — Other Ambulatory Visit: Payer: Self-pay

## 2013-03-19 MED ORDER — NONFORMULARY OR COMPOUNDED ITEM: Status: DC

## 2013-03-19 MED ORDER — ATORVASTATIN CALCIUM 20 MG PO TABS: ORAL_TABLET | ORAL | Status: DC

## 2013-03-22 ENCOUNTER — Encounter (INDEPENDENT_AMBULATORY_CARE_PROVIDER_SITE_OTHER): Payer: Medicare Other | Admitting: Ophthalmology

## 2013-03-29 ENCOUNTER — Ambulatory Visit (INDEPENDENT_AMBULATORY_CARE_PROVIDER_SITE_OTHER): Payer: Medicare Other | Admitting: Internal Medicine

## 2013-03-29 ENCOUNTER — Encounter: Payer: Self-pay | Admitting: Internal Medicine

## 2013-03-29 VITALS — BP 130/68 | HR 78 | Ht 67.0 in | Wt 232.0 lb

## 2013-03-29 DIAGNOSIS — I495 Sick sinus syndrome: Secondary | ICD-10-CM

## 2013-03-29 DIAGNOSIS — I10 Essential (primary) hypertension: Secondary | ICD-10-CM

## 2013-03-29 NOTE — Progress Notes (Signed)
PCP: Geraldo Pitter, MD Primary Cardiologist:  Dr Pamella Pert is a 79 y.o. female who presents today for routine electrophysiology followup.  Since her last visit, the patient reports doing very well.  Today, she denies symptoms of palpitations, chest pain, SOB,  lower extremity edema, dizziness, presyncope, or syncope.  The patient is otherwise without complaint today.   Past Medical History  Diagnosis Date  . CAD (coronary artery disease) 2009    Multivessel s/p PCI w/DES 2009 and CABG 2011  . HTN (hypertension)   . Type II or unspecified type diabetes mellitus without mention of complication, not stated as uncontrolled   . PVD (peripheral vascular disease)   . Diverticulosis of colon   . Morbid obesity   . Gout   . Depression   . Osteoarthritis   . Osteopenia   . Hyperlipidemia   . LBP (low back pain)     Lumbar disc disease/lumbar spinal stenosis  . Disc disease, degenerative, cervical   . History of thrombocytopenia   . Anxiety   . GERD (gastroesophageal reflux disease)   . Barrett esophagus   . Gastroparesis   . Helicobacter pylori gastritis   . Allergic rhinitis   . Anemia   . Myocardial infarction   . Hypothyroidism   . H/O hiatal hernia   . Sick sinus syndrome     Dual-chamber PPM implant 02/2012  . CKD (chronic kidney disease), stage II     GFR 60-89 ml/min   Past Surgical History  Procedure Laterality Date  . Coronary stent placement      Drug-eluting stent to the left anterior descending, circumflex and right coronary artery in Jan 2009  . Cholecystectomy    . Abdominal hysterectomy    . Tubal ligation    . Ovarian cyst removal    . Coronary artery bypass graft  2011    LIMA-LAD, SVG-DIAG, SVG-OM1-OM2, SVG-PDA  . Eye surgery      BIL CATARACT REMOVAL 06/2010  . Shoulder arthroscopy  06/16/2011    Procedure: ARTHROSCOPY SHOULDER;  Surgeon: Kennieth Rad, MD;  Location: [facility] OR;  Service: Orthopedics;  Laterality: Left;  LEFT SHOULDER ARTHROSCOPY  ACROMIALPLASTY, POSSIBLE MINI OPEN CUFF REPAIR   . Cardiac catheterization      2011  DR COOPER (APPT NEXT WEEK)  . Pacemaker insertion  03/06/12    MDT Adapta L implanted by Dr Johney Frame for SSS    Current Outpatient Prescriptions  Medication Sig Dispense Refill  . acetaminophen (TYLENOL) 500 MG tablet Take 500 mg by mouth 2 (two) times daily as needed for pain.       Marland Kitchen allopurinol (ZYLOPRIM) 100 MG tablet Take 100 mg by mouth daily.      Marland Kitchen amLODipine (NORVASC) 5 MG tablet Take 5 mg by mouth daily.       Marland Kitchen aspirin 81 MG chewable tablet Chew 81 mg by mouth every evening.       Marland Kitchen atorvastatin (LIPITOR) 20 MG tablet Take one tablet by mouth daily--Reveal study Drug  1 tablet  0  . bumetanide (BUMEX) 1 MG tablet Take 3 mg by mouth daily.      . chlorthalidone (HYGROTON) 25 MG tablet Take 1 tablet (25 mg total) by mouth daily.  30 tablet  0  . clopidogrel (PLAVIX) 75 MG tablet Take 75 mg by mouth daily.      . colchicine 0.6 MG tablet Take 0.6 mg by mouth as needed.      . diclofenac (  VOLTAREN) 75 MG EC tablet Take 75 mg by mouth 2 (two) times daily.       . hydrALAZINE (APRESOLINE) 25 MG tablet Take 25 mg by mouth daily.       . insulin NPH (HUMULIN N,NOVOLIN N) 100 UNIT/ML injection Inject 55 Units into the skin daily. 50 units at 8:00 AM and 5 units at 10: 00 PM  2 vial  3  . iron polysaccharides (NIFEREX) 150 MG capsule Take 150 mg by mouth daily.        Marland Kitchen levothyroxine (SYNTHROID, LEVOTHROID) 100 MCG tablet Take 1 tablet (100 mcg total) by mouth daily.  30 tablet  0  . losartan (COZAAR) 100 MG tablet Take 1 tablet (100 mg total) by mouth daily.  90 tablet  3  . nitroGLYCERIN (NITROSTAT) 0.4 MG SL tablet Place 1 tablet (0.4 mg total) under the tongue every 5 (five) minutes as needed for chest pain.  25 tablet  3  . NONFORMULARY OR COMPOUNDED ITEM Anacetrapib 100mg  or matching placebo daily (Reveal study drug)       No current facility-administered medications for this visit.    Physical  Exam: Filed Vitals:   03/29/13 0909  BP: 130/68  Pulse: 78  Height: 5\' 7"  (1.702 m)  Weight: 232 lb (105.235 kg)    GEN- The patient is overweight appearing, alert and oriented x 3 today.   Head- normocephalic, atraumatic Eyes-  Sclera clear, conjunctiva pink Ears- hearing intact Oropharynx- clear Lungs- Clear to ausculation bilaterally, normal work of breathing Chest- pacemaker pocket is well healed Heart- Regular rate and rhythm, no murmurs, rubs or gallops, PMI not laterally displaced GI- soft, NT, ND, + BS Extremities- no clubbing, cyanosis, or edema  Pacemaker interrogation- reviewed in detail today,  See PACEART report  Assessment and Plan:  1. Sick sinus syndrome Normal pacemaker function See Pace Art report No changes today  2. HTN Stable No changes  [facility] Return to see Nehemiah Settle in the EP clinic in 1 year

## 2013-03-29 NOTE — Patient Instructions (Addendum)
Remote monitoring is used to monitor your pacemaker from home. This monitoring reduces the number of office visits required to check your device to one time per year. It allows [place] to keep an eye on the functioning of your device to ensure it is working properly. You are scheduled for a device check from home on 07-01-2013. You may send your transmission at any time that day. If you have a wireless device, the transmission will be sent automatically. After your physician reviews your transmission, you will receive a postcard with your next transmission date.  Your physician recommends that you schedule a follow-up appointment in: 12 months with Domenic Polite, PA  Please continue to follow up with Dr.Cooper as scheduled.

## 2013-04-01 LAB — MDC_IDC_ENUM_SESS_TYPE_INCLINIC
Battery Remaining Longevity: 11
Battery Voltage: 2.79 V
Brady Statistic AP VP Percent: 46 %
Brady Statistic AS VP Percent: 5 %
Lead Channel Pacing Threshold Amplitude: 0.5 V
Lead Channel Pacing Threshold Amplitude: 0.75 V
Lead Channel Sensing Intrinsic Amplitude: 1.4 mV
Lead Channel Sensing Intrinsic Amplitude: 4 mV
Lead Channel Setting Pacing Amplitude: 2 V
Lead Channel Setting Pacing Amplitude: 2.5 V
Lead Channel Setting Pacing Pulse Width: 0.4 ms

## 2013-04-17 ENCOUNTER — Encounter (INDEPENDENT_AMBULATORY_CARE_PROVIDER_SITE_OTHER): Payer: Medicare Other | Admitting: Ophthalmology

## 2013-04-17 DIAGNOSIS — E11319 Type 2 diabetes mellitus with unspecified diabetic retinopathy without macular edema: Secondary | ICD-10-CM

## 2013-04-17 DIAGNOSIS — E1139 Type 2 diabetes mellitus with other diabetic ophthalmic complication: Secondary | ICD-10-CM

## 2013-04-17 DIAGNOSIS — H43819 Vitreous degeneration, unspecified eye: Secondary | ICD-10-CM

## 2013-04-17 DIAGNOSIS — H35039 Hypertensive retinopathy, unspecified eye: Secondary | ICD-10-CM

## 2013-04-17 DIAGNOSIS — E1165 Type 2 diabetes mellitus with hyperglycemia: Secondary | ICD-10-CM

## 2013-04-17 DIAGNOSIS — I10 Essential (primary) hypertension: Secondary | ICD-10-CM

## 2013-04-22 ENCOUNTER — Other Ambulatory Visit: Payer: Self-pay | Admitting: Cardiovascular Disease

## 2013-04-24 NOTE — Telephone Encounter (Signed)
Was this patient put on a reveal study drug, lipitor, at her last visit? Please advise so I will know whether to refill the pravastatin or not. Thanks, MI

## 2013-04-25 NOTE — Telephone Encounter (Signed)
Is this patient to be taking pravastatin along with the study drug atorvastatin that she was put on? She stated that she has been taking both of them because she was not told to do otherwise. I was not sure if she was to be on two statins at the same time so I am sending to you for review. Please advise. Thanks, MI

## 2013-05-22 ENCOUNTER — Ambulatory Visit: Payer: Medicare Other | Admitting: Endocrinology

## 2013-06-17 ENCOUNTER — Encounter (INDEPENDENT_AMBULATORY_CARE_PROVIDER_SITE_OTHER): Payer: Medicare Other | Admitting: Ophthalmology

## 2013-07-01 ENCOUNTER — Ambulatory Visit (INDEPENDENT_AMBULATORY_CARE_PROVIDER_SITE_OTHER): Payer: Medicare Other | Admitting: *Deleted

## 2013-07-01 DIAGNOSIS — I495 Sick sinus syndrome: Secondary | ICD-10-CM

## 2013-07-01 LAB — MDC_IDC_ENUM_SESS_TYPE_REMOTE
Brady Statistic AP VS Percent: 33.1 %
Brady Statistic AS VS Percent: 16.8 %
Lead Channel Impedance Value: 435 Ohm
Lead Channel Pacing Threshold Amplitude: 0.5 V
Lead Channel Pacing Threshold Amplitude: 0.875 V
Lead Channel Pacing Threshold Pulse Width: 0.4 ms
Lead Channel Pacing Threshold Pulse Width: 0.4 ms
Lead Channel Sensing Intrinsic Amplitude: 5.6 mV
Lead Channel Setting Sensing Sensitivity: 2 mV
MDC IDC MSMT BATTERY IMPEDANCE: 112 Ohm
MDC IDC MSMT BATTERY VOLTAGE: 2.79 V
MDC IDC MSMT LEADCHNL RA IMPEDANCE VALUE: 433 Ohm
MDC IDC MSMT LEADCHNL RA SENSING INTR AMPL: 1.4 mV
MDC IDC SET LEADCHNL RA PACING AMPLITUDE: 2 V
MDC IDC SET LEADCHNL RV PACING AMPLITUDE: 2.5 V
MDC IDC SET LEADCHNL RV PACING PULSEWIDTH: 0.4 ms
MDC IDC STAT BRADY AP VP PERCENT: 45.2 %
MDC IDC STAT BRADY AS VP PERCENT: 4.9 %

## 2013-07-04 ENCOUNTER — Other Ambulatory Visit: Payer: Self-pay | Admitting: Cardiovascular Disease

## 2013-07-31 ENCOUNTER — Encounter: Payer: Self-pay | Admitting: Internal Medicine

## 2013-08-02 ENCOUNTER — Other Ambulatory Visit: Payer: Self-pay | Admitting: Cardiovascular Disease

## 2013-09-05 ENCOUNTER — Other Ambulatory Visit: Payer: Self-pay | Admitting: Cardiovascular Disease

## 2013-09-10 ENCOUNTER — Ambulatory Visit (INDEPENDENT_AMBULATORY_CARE_PROVIDER_SITE_OTHER): Payer: Medicare Other | Admitting: Cardiovascular Disease

## 2013-09-10 ENCOUNTER — Encounter: Payer: Self-pay | Admitting: Cardiovascular Disease

## 2013-09-10 VITALS — BP 142/66 | HR 71 | Ht 67.0 in | Wt 246.1 lb

## 2013-09-10 DIAGNOSIS — R0602 Shortness of breath: Secondary | ICD-10-CM

## 2013-09-10 DIAGNOSIS — I10 Essential (primary) hypertension: Secondary | ICD-10-CM

## 2013-09-10 DIAGNOSIS — R0989 Other specified symptoms and signs involving the circulatory and respiratory systems: Secondary | ICD-10-CM

## 2013-09-10 DIAGNOSIS — I2510 Atherosclerotic heart disease of native coronary artery without angina pectoris: Secondary | ICD-10-CM

## 2013-09-10 DIAGNOSIS — I5033 Acute on chronic diastolic (congestive) heart failure: Secondary | ICD-10-CM

## 2013-09-10 LAB — BASIC METABOLIC PANEL
BUN: 46 mg/dL — ABNORMAL HIGH (ref 6–23)
CALCIUM: 9.5 mg/dL (ref 8.4–10.5)
CO2: 30 meq/L (ref 19–32)
CREATININE: 1.3 mg/dL — AB (ref 0.4–1.2)
Chloride: 106 mEq/L (ref 96–112)
GFR: 49.51 mL/min — ABNORMAL LOW (ref >60.00)
GLUCOSE: 77 mg/dL (ref 70–99)
Potassium: 3.9 mEq/L (ref 3.5–5.1)
Sodium: 142 mEq/L (ref 135–145)

## 2013-09-10 LAB — BRAIN NATRIURETIC PEPTIDE: Pro B Natriuretic peptide (BNP): 75 pg/mL (ref 0.0–100.0)

## 2013-09-10 MED ORDER — BUMETANIDE 2 MG PO TABS: 2.0000 mg | ORAL_TABLET | Freq: Every day | ORAL | Status: DC

## 2013-09-10 NOTE — Patient Instructions (Addendum)
Your physician recommends that you have lab work today: BMP and BNP  Your physician has recommended you make the following change in your medication: INCREASE Bumex to 2mg  daily  Your physician recommends that you schedule a follow-up appointment in: 3 MONTHS with PA/NP   Your physician has requested that you have a carotid duplex in 3 MONTHS. This test is an ultrasound of the carotid arteries in your neck. It looks at blood flow through these arteries that supply the brain with blood. Allow one hour for this exam. There are no restrictions or special instructions.

## 2013-09-10 NOTE — Progress Notes (Signed)
HPI:  [name] returns for follow-up evaluation. The patient has been followed for coronary artery disease status post CABG in 2011. She underwent permanent pacemaker placement in 2013. She also has chronic diastolic heart failure likely related to obesity, HTN, and ischemic disease.  The patient describes worsening shortness of breath and edema over the past few weeks. She denies orthopnea, PND, or chest pain. She was given furosemide by Dr. Parke SimmersBland take for a limited period of time. She also takes Bumex 1 mg daily.  The patient has also been experiencing symptoms of dizziness. She has attributed this to an inner ear infection. She tried some medication but she did not tolerate it.   Outpatient Encounter Prescriptions as of 09/10/2013  Medication Sig  . acetaminophen (TYLENOL) 500 MG tablet Take 500 mg by mouth 2 (two) times daily as needed for pain.   Marland Kitchen. allopurinol (ZYLOPRIM) 100 MG tablet Take 100 mg by mouth daily.  Marland Kitchen. amLODipine (NORVASC) 5 MG tablet Take 5 mg by mouth daily.   Marland Kitchen. aspirin 81 MG chewable tablet Chew 81 mg by mouth every evening.   Marland Kitchen. atorvastatin (LIPITOR) 20 MG tablet Take one tablet by mouth daily--Reveal study Drug  . bumetanide (BUMEX) 1 MG tablet Take 3 mg by mouth daily.  . chlorthalidone (HYGROTON) 25 MG tablet Take 1 tablet (25 mg total) by mouth daily.  . clopidogrel (PLAVIX) 75 MG tablet TAKE ONE TABLET BY MOUTH IN THE EVENING *NEEDS  TO  BE  SEEN*  . colchicine 0.6 MG tablet Take 0.6 mg by mouth as needed.  . diclofenac (VOLTAREN) 75 MG EC tablet Take 75 mg by mouth 2 (two) times daily.   . hydrALAZINE (APRESOLINE) 25 MG tablet Take 25 mg by mouth daily.   . insulin NPH (HUMULIN N,NOVOLIN N) 100 UNIT/ML injection Inject 55 Units into the skin daily. 50 units at 8:00 AM and 5 units at 10: 00 PM  . iron polysaccharides (NIFEREX) 150 MG capsule Take 150 mg by mouth daily.    Marland Kitchen. levothyroxine (SYNTHROID, LEVOTHROID) 100 MCG tablet Take 1 tablet (100 mcg total) by mouth  daily.  Marland Kitchen. losartan (COZAAR) 100 MG tablet Take 1 tablet (100 mg total) by mouth daily.  . nitroGLYCERIN (NITROSTAT) 0.4 MG SL tablet Place 1 tablet (0.4 mg total) under the tongue every 5 (five) minutes as needed for chest pain.  . NONFORMULARY OR COMPOUNDED ITEM Anacetrapib 100mg  or matching placebo daily (Reveal study drug)  . ONE TOUCH ULTRA TEST test strip   . pioglitazone (ACTOS) 15 MG tablet     Allergies  Allergen Reactions  . Ciprofloxacin Nausea And Vomiting    syncope  . Codeine Other (See Comments)    HALLUCINATIONS  . Diltiazem Hcl Other (See Comments)    : low heart rate  . Hydrocodone Other (See Comments)    Makes her pass out  . Lovastatin Other (See Comments)    Pt doesn't remember a reaction  . Metformin Other (See Comments)    diarrhea  . Penicillins Hives  . Shellfish Allergy Hives  . Sulfonamide Derivatives Nausea And Vomiting    Past Medical History  Diagnosis Date  . CAD (coronary artery disease) 2009    Multivessel s/p PCI w/DES 2009 and CABG 2011  . HTN (hypertension)   . Type II or unspecified type diabetes mellitus without mention of complication, not stated as uncontrolled   . PVD (peripheral vascular disease)   . Diverticulosis of colon   . Morbid  obesity   . Gout   . Depression   . Osteoarthritis   . Osteopenia   . Hyperlipidemia   . LBP (low back pain)     Lumbar disc disease/lumbar spinal stenosis  . Disc disease, degenerative, cervical   . History of thrombocytopenia   . Anxiety   . GERD (gastroesophageal reflux disease)   . Barrett esophagus   . Gastroparesis   . Helicobacter pylori gastritis   . Allergic rhinitis   . Anemia   . Myocardial infarction   . Hypothyroidism   . H/O hiatal hernia   . Sick sinus syndrome     Dual-chamber PPM implant 02/2012  . CKD (chronic kidney disease), stage II     GFR 60-89 ml/min    ROS: Negative except as per HPI  BP 142/66  Pulse 71  Ht 5\' 7"  (1.702 m)  Wt 111.639 kg (246 lb 1.9 oz)   BMI 38.54 kg/m2  PHYSICAL EXAM: Pt is alert and oriented, pleasant obese woman in NAD HEENT: normal Neck: JVP - normal, carotids 2+= with a left carotid bruit Lungs: Bibasilar rales CV: RRR without murmur or gallop Abd: soft, NT, Positive BS Ext: 2+ pretibial edema bilaterally, distal pulses intact and equal Skin: warm/dry no rash  EKG:  AV sequential pacing 70 beats per minute  ASSESSMENT AND PLAN: 1. Acute on chronic diastolic heart failure. Recommend increase Bumex to 2 mg daily. We'll check a metabolic panel and BNP today. Will arrange followup in 3 months.  2. Coronary artery disease status post CABG. The patient is stable without symptoms of angina. She is appropriately on aspirin and a statin drug.  3. Hypertension. Blood pressure controlled on multidrug therapy with amlodipine, Bumex, chlorthalidone, hydralazine, and losartan.  4. Left carotid bruit. Check carotid duplex scan.  Tonny Bollman 09/10/2013 11:33 AM

## 2013-09-12 ENCOUNTER — Other Ambulatory Visit: Payer: Self-pay

## 2013-09-12 DIAGNOSIS — I5033 Acute on chronic diastolic (congestive) heart failure: Secondary | ICD-10-CM

## 2013-09-25 ENCOUNTER — Other Ambulatory Visit (INDEPENDENT_AMBULATORY_CARE_PROVIDER_SITE_OTHER): Payer: Medicare Other

## 2013-09-25 DIAGNOSIS — I5033 Acute on chronic diastolic (congestive) heart failure: Secondary | ICD-10-CM

## 2013-09-25 LAB — BASIC METABOLIC PANEL
BUN: 57 mg/dL — ABNORMAL HIGH (ref 6–23)
CALCIUM: 9.1 mg/dL (ref 8.4–10.5)
CHLORIDE: 104 meq/L (ref 96–112)
CO2: 28 meq/L (ref 19–32)
CREATININE: 2.1 mg/dL — AB (ref 0.4–1.2)
GFR: 28.59 mL/min — ABNORMAL LOW (ref >60.00)
GLUCOSE: 310 mg/dL — AB (ref 70–99)
Potassium: 4 mEq/L (ref 3.5–5.1)
Sodium: 140 mEq/L (ref 135–145)

## 2013-09-25 LAB — BRAIN NATRIURETIC PEPTIDE: Pro B Natriuretic peptide (BNP): 61 pg/mL (ref 0.0–100.0)

## 2013-09-27 ENCOUNTER — Other Ambulatory Visit: Payer: Self-pay

## 2013-09-27 DIAGNOSIS — I5033 Acute on chronic diastolic (congestive) heart failure: Secondary | ICD-10-CM

## 2013-09-27 MED ORDER — BUMETANIDE 1 MG PO TABS: 1.0000 mg | ORAL_TABLET | Freq: Every day | ORAL | Status: DC

## 2013-09-30 ENCOUNTER — Telehealth: Payer: Self-pay | Admitting: Cardiovascular Disease

## 2013-09-30 DIAGNOSIS — I5033 Acute on chronic diastolic (congestive) heart failure: Secondary | ICD-10-CM

## 2013-09-30 NOTE — Telephone Encounter (Signed)
Called stating she can not tolerate the Bumex.  States it makes her feel bad and very weak. States she is still having swelling in ankles and legs.  States wt is up and down.  Wt yesterday was 235 which is down from wt when she was in office on 09/10/13 (246).  States she has been on Lasix in past but she thinks they changed to Bumex due to her being on Chlorthalidone. Spoke w/Dr. Anne FuSkains (DOD) who suggested she stop Bumex and to get BMET tomorrow.  Her BUN and Creat were elevated on lab from 09/25/13.  She will come in tomorrow for labs.

## 2013-09-30 NOTE — Telephone Encounter (Signed)
New message     Pt states that she cannot tolerate bumex.  It makes her feel "fainty".  Please call

## 2013-10-01 ENCOUNTER — Other Ambulatory Visit (INDEPENDENT_AMBULATORY_CARE_PROVIDER_SITE_OTHER): Payer: Medicare Other

## 2013-10-01 DIAGNOSIS — I5033 Acute on chronic diastolic (congestive) heart failure: Secondary | ICD-10-CM

## 2013-10-01 LAB — BASIC METABOLIC PANEL
BUN: 40 mg/dL — AB (ref 6–23)
CHLORIDE: 104 meq/L (ref 96–112)
CO2: 29 mEq/L (ref 19–32)
CREATININE: 1.3 mg/dL — AB (ref 0.4–1.2)
Calcium: 9 mg/dL (ref 8.4–10.5)
GFR: 52.69 mL/min — AB (ref >60.00)
GLUCOSE: 146 mg/dL — AB (ref 70–99)
Potassium: 3.6 mEq/L (ref 3.5–5.1)
Sodium: 138 mEq/L (ref 135–145)

## 2013-10-03 ENCOUNTER — Encounter: Payer: Self-pay | Admitting: Internal Medicine

## 2013-10-03 ENCOUNTER — Ambulatory Visit (INDEPENDENT_AMBULATORY_CARE_PROVIDER_SITE_OTHER): Payer: Medicare Other | Admitting: *Deleted

## 2013-10-03 DIAGNOSIS — I495 Sick sinus syndrome: Secondary | ICD-10-CM

## 2013-10-03 NOTE — Progress Notes (Signed)
Remote pacemaker transmission.   

## 2013-10-07 LAB — MDC_IDC_ENUM_SESS_TYPE_REMOTE
Battery Impedance: 136 Ohm
Brady Statistic AP VP Percent: 45 %
Brady Statistic AP VS Percent: 34 %
Brady Statistic AS VS Percent: 16 %
Date Time Interrogation Session: 20150625132734
Lead Channel Impedance Value: 433 Ohm
Lead Channel Impedance Value: 435 Ohm
Lead Channel Pacing Threshold Pulse Width: 0.4 ms
Lead Channel Pacing Threshold Pulse Width: 0.4 ms
Lead Channel Sensing Intrinsic Amplitude: 2.8 mV
Lead Channel Setting Sensing Sensitivity: 2 mV
MDC IDC MSMT BATTERY REMAINING LONGEVITY: 125 mo
MDC IDC MSMT BATTERY VOLTAGE: 2.79 V
MDC IDC MSMT LEADCHNL RA PACING THRESHOLD AMPLITUDE: 0.375 V
MDC IDC MSMT LEADCHNL RV PACING THRESHOLD AMPLITUDE: 0.875 V
MDC IDC MSMT LEADCHNL RV SENSING INTR AMPL: 8 mV
MDC IDC SET LEADCHNL RA PACING AMPLITUDE: 2 V
MDC IDC SET LEADCHNL RV PACING AMPLITUDE: 2.5 V
MDC IDC SET LEADCHNL RV PACING PULSEWIDTH: 0.4 ms
MDC IDC STAT BRADY AS VP PERCENT: 5 %

## 2013-10-09 ENCOUNTER — Other Ambulatory Visit: Payer: Medicare Other

## 2013-10-09 ENCOUNTER — Encounter: Payer: Self-pay | Admitting: Cardiology

## 2013-10-13 ENCOUNTER — Other Ambulatory Visit: Payer: Self-pay | Admitting: Cardiovascular Disease

## 2013-10-25 ENCOUNTER — Other Ambulatory Visit: Payer: Self-pay

## 2013-10-25 DIAGNOSIS — I5033 Acute on chronic diastolic (congestive) heart failure: Secondary | ICD-10-CM

## 2013-10-25 MED ORDER — FUROSEMIDE 40 MG PO TABS: 40.0000 mg | ORAL_TABLET | Freq: Every day | ORAL | Status: DC

## 2013-11-07 ENCOUNTER — Other Ambulatory Visit (INDEPENDENT_AMBULATORY_CARE_PROVIDER_SITE_OTHER): Payer: Medicare Other

## 2013-11-07 DIAGNOSIS — I5033 Acute on chronic diastolic (congestive) heart failure: Secondary | ICD-10-CM

## 2013-11-07 LAB — BASIC METABOLIC PANEL
BUN: 36 mg/dL — AB (ref 6–23)
CHLORIDE: 103 meq/L (ref 96–112)
CO2: 26 meq/L (ref 19–32)
Calcium: 9.1 mg/dL (ref 8.4–10.5)
Creatinine, Ser: 1.2 mg/dL (ref 0.4–1.2)
GFR: 57.38 mL/min — ABNORMAL LOW (ref >60.00)
Glucose, Bld: 138 mg/dL — ABNORMAL HIGH (ref 70–99)
Potassium: 3.7 mEq/L (ref 3.5–5.1)
Sodium: 140 mEq/L (ref 135–145)

## 2013-11-29 ENCOUNTER — Inpatient Hospital Stay (HOSPITAL_COMMUNITY)
Admission: EM | Admit: 2013-11-29 | Discharge: 2013-12-03 | DRG: 287 | Disposition: A | Discharge status: Home / Self Care | Payer: Medicare Other | Attending: Internal Medicine | Admitting: Internal Medicine

## 2013-11-29 ENCOUNTER — Telehealth: Payer: Self-pay | Admitting: Cardiovascular Disease

## 2013-11-29 ENCOUNTER — Emergency Department (HOSPITAL_COMMUNITY): Payer: Medicare Other

## 2013-11-29 ENCOUNTER — Encounter (HOSPITAL_COMMUNITY): Payer: Self-pay | Admitting: Emergency Medicine

## 2013-11-29 DIAGNOSIS — N182 Chronic kidney disease, stage 2 (mild): Secondary | ICD-10-CM | POA: Diagnosis present

## 2013-11-29 DIAGNOSIS — Z87891 Personal history of nicotine dependence: Secondary | ICD-10-CM

## 2013-11-29 DIAGNOSIS — E118 Type 2 diabetes mellitus with unspecified complications: Secondary | ICD-10-CM

## 2013-11-29 DIAGNOSIS — E119 Type 2 diabetes mellitus without complications: Secondary | ICD-10-CM | POA: Diagnosis not present

## 2013-11-29 DIAGNOSIS — M109 Gout, unspecified: Secondary | ICD-10-CM | POA: Diagnosis present

## 2013-11-29 DIAGNOSIS — Z885 Allergy status to narcotic agent status: Secondary | ICD-10-CM | POA: Diagnosis not present

## 2013-11-29 DIAGNOSIS — I200 Unstable angina: Secondary | ICD-10-CM | POA: Diagnosis present

## 2013-11-29 DIAGNOSIS — Y849 Medical procedure, unspecified as the cause of abnormal reaction of the patient, or of later complication, without mention of misadventure at the time of the procedure: Secondary | ICD-10-CM | POA: Diagnosis present

## 2013-11-29 DIAGNOSIS — Z823 Family history of stroke: Secondary | ICD-10-CM

## 2013-11-29 DIAGNOSIS — I129 Hypertensive chronic kidney disease with stage 1 through stage 4 chronic kidney disease, or unspecified chronic kidney disease: Secondary | ICD-10-CM | POA: Diagnosis present

## 2013-11-29 DIAGNOSIS — I2582 Chronic total occlusion of coronary artery: Secondary | ICD-10-CM | POA: Diagnosis not present

## 2013-11-29 DIAGNOSIS — I10 Essential (primary) hypertension: Secondary | ICD-10-CM | POA: Diagnosis present

## 2013-11-29 DIAGNOSIS — Z951 Presence of aortocoronary bypass graft: Secondary | ICD-10-CM | POA: Diagnosis present

## 2013-11-29 DIAGNOSIS — I2510 Atherosclerotic heart disease of native coronary artery without angina pectoris: Secondary | ICD-10-CM | POA: Diagnosis not present

## 2013-11-29 DIAGNOSIS — K5730 Diverticulosis of large intestine without perforation or abscess without bleeding: Secondary | ICD-10-CM | POA: Diagnosis present

## 2013-11-29 DIAGNOSIS — E785 Hyperlipidemia, unspecified: Secondary | ICD-10-CM | POA: Diagnosis present

## 2013-11-29 DIAGNOSIS — I739 Peripheral vascular disease, unspecified: Secondary | ICD-10-CM | POA: Diagnosis present

## 2013-11-29 DIAGNOSIS — Z8249 Family history of ischemic heart disease and other diseases of the circulatory system: Secondary | ICD-10-CM | POA: Diagnosis not present

## 2013-11-29 DIAGNOSIS — Z7902 Long term (current) use of antithrombotics/antiplatelets: Secondary | ICD-10-CM | POA: Diagnosis not present

## 2013-11-29 DIAGNOSIS — Z90710 Acquired absence of both cervix and uterus: Secondary | ICD-10-CM | POA: Diagnosis not present

## 2013-11-29 DIAGNOSIS — Z9861 Coronary angioplasty status: Secondary | ICD-10-CM | POA: Diagnosis not present

## 2013-11-29 DIAGNOSIS — Z9849 Cataract extraction status, unspecified eye: Secondary | ICD-10-CM | POA: Diagnosis not present

## 2013-11-29 DIAGNOSIS — Z7982 Long term (current) use of aspirin: Secondary | ICD-10-CM

## 2013-11-29 DIAGNOSIS — I252 Old myocardial infarction: Secondary | ICD-10-CM | POA: Diagnosis not present

## 2013-11-29 DIAGNOSIS — I5032 Chronic diastolic (congestive) heart failure: Secondary | ICD-10-CM | POA: Diagnosis present

## 2013-11-29 DIAGNOSIS — Z880 Allergy status to penicillin: Secondary | ICD-10-CM

## 2013-11-29 DIAGNOSIS — IMO0001 Reserved for inherently not codable concepts without codable children: Secondary | ICD-10-CM | POA: Diagnosis present

## 2013-11-29 DIAGNOSIS — Z79899 Other long term (current) drug therapy: Secondary | ICD-10-CM

## 2013-11-29 DIAGNOSIS — D649 Anemia, unspecified: Secondary | ICD-10-CM | POA: Diagnosis present

## 2013-11-29 DIAGNOSIS — E039 Hypothyroidism, unspecified: Secondary | ICD-10-CM | POA: Diagnosis present

## 2013-11-29 DIAGNOSIS — Z833 Family history of diabetes mellitus: Secondary | ICD-10-CM | POA: Diagnosis not present

## 2013-11-29 DIAGNOSIS — Z794 Long term (current) use of insulin: Secondary | ICD-10-CM

## 2013-11-29 DIAGNOSIS — I5033 Acute on chronic diastolic (congestive) heart failure: Secondary | ICD-10-CM

## 2013-11-29 LAB — TROPONIN I

## 2013-11-29 LAB — COMPREHENSIVE METABOLIC PANEL
ALBUMIN: 3.2 g/dL — AB (ref 3.5–5.2)
ALK PHOS: 91 U/L (ref 39–117)
ALT: 20 U/L (ref 0–35)
ANION GAP: 12 (ref 5–15)
AST: 20 U/L (ref 0–37)
BUN: 30 mg/dL — AB (ref 6–23)
CALCIUM: 9.1 mg/dL (ref 8.4–10.5)
CO2: 25 mEq/L (ref 19–32)
Chloride: 103 mEq/L (ref 96–112)
Creatinine, Ser: 1.01 mg/dL (ref 0.50–1.10)
GFR calc Af Amer: 60 mL/min — ABNORMAL LOW (ref >90)
GFR calc non Af Amer: 52 mL/min — ABNORMAL LOW (ref >90)
Glucose, Bld: 196 mg/dL — ABNORMAL HIGH (ref 70–99)
POTASSIUM: 4.1 meq/L (ref 3.7–5.3)
Sodium: 140 mEq/L (ref 137–147)
Total Bilirubin: 0.3 mg/dL (ref 0.3–1.2)
Total Protein: 6.6 g/dL (ref 6.0–8.3)

## 2013-11-29 LAB — I-STAT CHEM 8, ED
BUN: 41 mg/dL — AB (ref 6–23)
Calcium, Ion: 1.2 mmol/L (ref 1.13–1.30)
Chloride: 104 mEq/L (ref 96–112)
Creatinine, Ser: 1.2 mg/dL — ABNORMAL HIGH (ref 0.50–1.10)
Glucose, Bld: 155 mg/dL — ABNORMAL HIGH (ref 70–99)
HCT: 37 % (ref 36.0–46.0)
Hemoglobin: 12.6 g/dL (ref 12.0–15.0)
Potassium: 4.6 mEq/L (ref 3.7–5.3)
SODIUM: 138 meq/L (ref 137–147)
TCO2: 27 mmol/L (ref 0–100)

## 2013-11-29 LAB — GLUCOSE, CAPILLARY: Glucose-Capillary: 179 mg/dL — ABNORMAL HIGH (ref 70–99)

## 2013-11-29 LAB — LIPASE, BLOOD: Lipase: 31 U/L (ref 11–59)

## 2013-11-29 LAB — MAGNESIUM: Magnesium: 2.2 mg/dL (ref 1.5–2.5)

## 2013-11-29 LAB — I-STAT TROPONIN, ED: TROPONIN I, POC: 0 ng/mL (ref 0.00–0.08)

## 2013-11-29 MED ORDER — INSULIN NPH (HUMAN) (ISOPHANE) 100 UNIT/ML ~~LOC~~ SUSP
15.0000 [IU] | Freq: Two times a day (BID) | SUBCUTANEOUS | Status: DC
  Administered 2013-11-30 – 2013-12-03 (×7): 15 [IU] via SUBCUTANEOUS
  Filled 2013-11-29 (×2): qty 10

## 2013-11-29 MED ORDER — AMLODIPINE BESYLATE 5 MG PO TABS
5.0000 mg | ORAL_TABLET | Freq: Every day | ORAL | Status: DC
  Administered 2013-11-29 – 2013-12-02 (×4): 5 mg via ORAL
  Filled 2013-11-29 (×5): qty 1

## 2013-11-29 MED ORDER — NITROGLYCERIN 0.4 MG SL SUBL: 0.4000 mg | SUBLINGUAL_TABLET | SUBLINGUAL | Status: DC | PRN

## 2013-11-29 MED ORDER — HEPARIN (PORCINE) IN NACL 100-0.45 UNIT/ML-% IJ SOLN
1250.0000 [IU]/h | INTRAMUSCULAR | Status: DC
  Administered 2013-11-29 – 2013-11-30 (×2): 1050 [IU]/h via INTRAVENOUS
  Administered 2013-12-01: 1250 [IU]/h via INTRAVENOUS
  Filled 2013-11-29 (×6): qty 250

## 2013-11-29 MED ORDER — LEVOTHYROXINE SODIUM 100 MCG PO TABS: 100.0000 ug | ORAL_TABLET | Freq: Every day | ORAL | Status: DC

## 2013-11-29 MED ORDER — PIOGLITAZONE HCL 15 MG PO TABS
15.0000 mg | ORAL_TABLET | Freq: Every day | ORAL | Status: DC
  Administered 2013-11-29 – 2013-12-02 (×4): 15 mg via ORAL
  Filled 2013-11-29 (×5): qty 1

## 2013-11-29 MED ORDER — POLYSACCHARIDE IRON COMPLEX 150 MG PO CAPS
150.0000 mg | ORAL_CAPSULE | Freq: Every day | ORAL | Status: DC
  Administered 2013-11-29 – 2013-12-02 (×4): 150 mg via ORAL
  Filled 2013-11-29 (×5): qty 1

## 2013-11-29 MED ORDER — INSULIN ASPART 100 UNIT/ML ~~LOC~~ SOLN
0.0000 [IU] | Freq: Three times a day (TID) | SUBCUTANEOUS | Status: DC
  Administered 2013-11-30 – 2013-12-01 (×4): 1 [IU] via SUBCUTANEOUS
  Administered 2013-12-01 (×2): 2 [IU] via SUBCUTANEOUS
  Administered 2013-12-02: 1 [IU] via SUBCUTANEOUS

## 2013-11-29 MED ORDER — SODIUM CHLORIDE 0.9 % IV SOLN
INTRAVENOUS | Status: DC
  Administered 2013-11-29: 17:00:00 via INTRAVENOUS

## 2013-11-29 MED ORDER — CLOPIDOGREL BISULFATE 75 MG PO TABS
75.0000 mg | ORAL_TABLET | Freq: Every day | ORAL | Status: DC
  Administered 2013-11-29 – 2013-12-02 (×4): 75 mg via ORAL
  Filled 2013-11-29 (×5): qty 1

## 2013-11-29 MED ORDER — LEVOTHYROXINE SODIUM 100 MCG PO TABS
100.0000 ug | ORAL_TABLET | Freq: Every day | ORAL | Status: DC
  Administered 2013-11-30 – 2013-12-03 (×4): 100 ug via ORAL
  Filled 2013-11-29 (×6): qty 1

## 2013-11-29 MED ORDER — NITROGLYCERIN 2 % TD OINT
0.5000 [in_us] | TOPICAL_OINTMENT | Freq: Four times a day (QID) | TRANSDERMAL | Status: DC
  Administered 2013-11-29 – 2013-12-02 (×11): 0.5 [in_us] via TOPICAL
  Filled 2013-11-29: qty 30

## 2013-11-29 MED ORDER — ONDANSETRON HCL 4 MG/2ML IJ SOLN
4.0000 mg | Freq: Four times a day (QID) | INTRAMUSCULAR | Status: DC | PRN
  Administered 2013-12-02: 4 mg via INTRAVENOUS

## 2013-11-29 MED ORDER — HYDRALAZINE HCL 25 MG PO TABS
25.0000 mg | ORAL_TABLET | Freq: Every day | ORAL | Status: DC
  Administered 2013-11-29 – 2013-12-02 (×4): 25 mg via ORAL
  Filled 2013-11-29 (×5): qty 1

## 2013-11-29 MED ORDER — ASPIRIN 81 MG PO CHEW: 81.0000 mg | CHEWABLE_TABLET | Freq: Every evening | ORAL | Status: DC

## 2013-11-29 MED ORDER — ACETAMINOPHEN 325 MG PO TABS
650.0000 mg | ORAL_TABLET | ORAL | Status: DC | PRN
  Administered 2013-11-30 – 2013-12-01 (×2): 650 mg via ORAL
  Filled 2013-11-29 (×2): qty 2

## 2013-11-29 MED ORDER — CHLORTHALIDONE 25 MG PO TABS
25.0000 mg | ORAL_TABLET | Freq: Every day | ORAL | Status: DC
  Administered 2013-11-29: 25 mg via ORAL
  Filled 2013-11-29 (×2): qty 1

## 2013-11-29 MED ORDER — ASPIRIN EC 81 MG PO TBEC
81.0000 mg | DELAYED_RELEASE_TABLET | Freq: Every day | ORAL | Status: DC
  Administered 2013-11-29 – 2013-12-02 (×4): 81 mg via ORAL
  Filled 2013-11-29 (×5): qty 1

## 2013-11-29 MED ORDER — LOSARTAN POTASSIUM 50 MG PO TABS
100.0000 mg | ORAL_TABLET | Freq: Every day | ORAL | Status: DC
  Administered 2013-11-29 – 2013-12-01 (×3): 100 mg via ORAL
  Filled 2013-11-29 (×4): qty 2

## 2013-11-29 MED ORDER — ALLOPURINOL 100 MG PO TABS
100.0000 mg | ORAL_TABLET | Freq: Every day | ORAL | Status: DC
Start: 2013-11-29 — End: 2013-11-29

## 2013-11-29 MED ORDER — ASPIRIN 81 MG PO CHEW
324.0000 mg | CHEWABLE_TABLET | ORAL | Status: AC
  Filled 2013-11-29: qty 4

## 2013-11-29 MED ORDER — INSULIN NPH (HUMAN) (ISOPHANE) 100 UNIT/ML ~~LOC~~ SUSP: 15.0000 [IU] | Freq: Two times a day (BID) | SUBCUTANEOUS | Status: DC

## 2013-11-29 MED ORDER — FUROSEMIDE 40 MG PO TABS
40.0000 mg | ORAL_TABLET | Freq: Every day | ORAL | Status: DC
  Administered 2013-11-29 – 2013-12-01 (×3): 40 mg via ORAL
  Filled 2013-11-29 (×3): qty 1

## 2013-11-29 MED ORDER — ASPIRIN 325 MG PO TABS
325.0000 mg | ORAL_TABLET | ORAL | Status: AC
  Administered 2013-11-29: 325 mg via ORAL
  Filled 2013-11-29: qty 1

## 2013-11-29 MED ORDER — INSULIN ASPART 100 UNIT/ML ~~LOC~~ SOLN: 0.0000 [IU] | Freq: Every day | SUBCUTANEOUS | Status: DC

## 2013-11-29 MED ORDER — ONDANSETRON HCL 4 MG/2ML IJ SOLN
4.0000 mg | Freq: Four times a day (QID) | INTRAMUSCULAR | Status: DC | PRN
  Filled 2013-11-29: qty 2

## 2013-11-29 MED ORDER — METOPROLOL TARTRATE 12.5 MG HALF TABLET
12.5000 mg | ORAL_TABLET | Freq: Two times a day (BID) | ORAL | Status: DC
  Administered 2013-11-29 – 2013-12-03 (×8): 12.5 mg via ORAL
  Filled 2013-11-29 (×9): qty 1

## 2013-11-29 MED ORDER — ATORVASTATIN CALCIUM 20 MG PO TABS
20.0000 mg | ORAL_TABLET | Freq: Every day | ORAL | Status: DC
  Administered 2013-11-29 – 2013-12-02 (×4): 20 mg via ORAL
  Filled 2013-11-29 (×5): qty 1

## 2013-11-29 MED ORDER — AMLODIPINE BESYLATE 5 MG PO TABS: 5.0000 mg | ORAL_TABLET | Freq: Every day | ORAL | Status: DC

## 2013-11-29 MED ORDER — ALLOPURINOL 100 MG PO TABS
100.0000 mg | ORAL_TABLET | Freq: Every day | ORAL | Status: DC
  Administered 2013-11-29 – 2013-12-02 (×4): 100 mg via ORAL
  Filled 2013-11-29 (×5): qty 1

## 2013-11-29 MED ORDER — ASPIRIN EC 81 MG PO TBEC: 81.0000 mg | DELAYED_RELEASE_TABLET | Freq: Every day | ORAL | Status: DC

## 2013-11-29 MED ORDER — ASPIRIN 300 MG RE SUPP: 300.0000 mg | RECTAL | Status: AC

## 2013-11-29 MED ORDER — HEPARIN BOLUS VIA INFUSION
4000.0000 [IU] | Freq: Once | INTRAVENOUS | Status: AC
  Administered 2013-11-29: 4000 [IU] via INTRAVENOUS
  Filled 2013-11-29: qty 4000

## 2013-11-29 NOTE — Telephone Encounter (Signed)
Spoke with pt who reports chest pain throughout the night. She takes NTG and it goes away but then comes back. She is currently having chest pain. Pt instructed to call 911 for transport to hospital.

## 2013-11-29 NOTE — ED Provider Notes (Signed)
CSN: [id number]     Arrival date & time 11/29/13  1055 History   First MD Initiated Contact with Patient 11/29/13 1115     Chief Complaint  Patient presents with  . Chest Pain    HPI  [name] is a 80 year old female with a history of coronary artery disease (status post PCI with drug-eluting stent 2009, CABG 2011), diastolic heart failure (status post pacemaker 2013), hypertension, diabetes who presents with chest pain since last night.  Patient states that her chest pain is on the left side of her chest, is a cramping type of pain, that is 8/10 in severity and does not radiate. She states that the pain is different from her previous episodes of chest pain are associated with ischemic events, stating that this pain is not as sharp or severe. She does state that she has taken nitroglycerin 3 times with successful palliation of her symptoms. She does also endorsed some nausea without any vomiting. She states that the chest pain is worsened with exertion, and is associated with dyspnea on exertion as well.  Past Medical History  Diagnosis Date  . CAD (coronary artery disease) 2009    Multivessel s/p PCI w/DES 2009 and CABG 2011  . HTN (hypertension)   . Type II or unspecified type diabetes mellitus without mention of complication, not stated as uncontrolled   . PVD (peripheral vascular disease)   . Diverticulosis of colon   . Morbid obesity   . Gout   . Depression   . Osteoarthritis   . Osteopenia   . Hyperlipidemia   . LBP (low back pain)     Lumbar disc disease/lumbar spinal stenosis  . Disc disease, degenerative, cervical   . History of thrombocytopenia   . Anxiety   . GERD (gastroesophageal reflux disease)   . Barrett esophagus   . Gastroparesis   . Helicobacter pylori gastritis   . Allergic rhinitis   . Anemia   . Myocardial infarction   . Hypothyroidism   . H/O hiatal hernia   . Sick sinus syndrome     Dual-chamber PPM implant 02/2012  . CKD (chronic kidney disease),  stage II     GFR 60-89 ml/min   Past Surgical History  Procedure Laterality Date  . Coronary stent placement      Drug-eluting stent to the left anterior descending, circumflex and right coronary artery in Jan 2009  . Cholecystectomy    . Abdominal hysterectomy    . Tubal ligation    . Ovarian cyst removal    . Coronary artery bypass graft  2011    LIMA-LAD, SVG-DIAG, SVG-OM1-OM2, SVG-PDA  . Eye surgery      BIL CATARACT REMOVAL 06/2010  . Shoulder arthroscopy  06/16/2011    Procedure: ARTHROSCOPY SHOULDER;  Surgeon: Kennieth Rad, MD;  Location: [facility] OR;  Service: Orthopedics;  Laterality: Left;  LEFT SHOULDER ARTHROSCOPY ACROMIALPLASTY, POSSIBLE MINI OPEN CUFF REPAIR   . Cardiac catheterization      2011  DR COOPER (APPT NEXT WEEK)  . Pacemaker insertion  03/06/12    MDT Adapta L implanted by Dr Johney Frame for SSS   Family History  Problem Relation Age of Onset  . Diabetes Mother   . Hypertension Mother   . Stroke Father   . Coronary artery disease Other    History  Substance Use Topics  . Smoking status: Former Games developer  . Smokeless tobacco: Never Used     Comment: quit 30 yrs ago  . Alcohol  Use: No   OB History   Grav Para Term Preterm Abortions TAB SAB Ect Mult Living                 Review of Systems  Constitutional: Negative for fever and chills.  Respiratory: Positive for shortness of breath. Negative for cough and wheezing.   Cardiovascular: Positive for chest pain.  Gastrointestinal: Negative for vomiting, abdominal pain, diarrhea, constipation and blood in stool.  Genitourinary: Negative for dysuria and hematuria.  Neurological: Negative for light-headedness and headaches.      Allergies  Ciprofloxacin; Codeine; Diltiazem hcl; Hydrocodone; Lovastatin; Metformin; Penicillins; Shellfish allergy; and Sulfonamide derivatives  Home Medications   Prior to Admission medications   Medication Sig Start Date End Date Taking? Authorizing Provider  acetaminophen  (TYLENOL) 500 MG tablet Take 500 mg by mouth 2 (two) times daily as needed for pain.    Yes Historical Provider, MD  allopurinol (ZYLOPRIM) 100 MG tablet Take 100 mg by mouth daily.   Yes Historical Provider, MD  amLODipine (NORVASC) 5 MG tablet Take 5 mg by mouth daily.  02/03/13  Yes Historical Provider, MD  aspirin 81 MG chewable tablet Chew 81 mg by mouth every evening.    Yes Historical Provider, MD  atorvastatin (LIPITOR) 20 MG tablet Take one tablet by mouth daily--Reveal study Drug 03/19/13  Yes Tonny Bollman, MD  chlorthalidone (HYGROTON) 25 MG tablet Take 1 tablet (25 mg total) by mouth daily. 03/08/12  Yes Penny Pia, MD  clopidogrel (PLAVIX) 75 MG tablet Take 1 tablet (75 mg total) by mouth daily.   Yes Tonny Bollman, MD  colchicine 0.6 MG tablet Take 0.6 mg by mouth as needed.   Yes Historical Provider, MD  diclofenac (VOLTAREN) 75 MG EC tablet Take 75 mg by mouth 2 (two) times daily.  01/21/13  Yes Historical Provider, MD  furosemide (LASIX) 40 MG tablet Take 1 tablet (40 mg total) by mouth daily. 10/25/13  Yes Tonny Bollman, MD  hydrALAZINE (APRESOLINE) 25 MG tablet Take 25 mg by mouth daily.  03/08/12  Yes Penny Pia, MD  insulin NPH Human (HUMULIN N,NOVOLIN N) 100 UNIT/ML injection Inject 10-40 Units into the skin 2 (two) times daily before a meal. 10 units at night, and 40 units in the morning.   Yes Historical Provider, MD  iron polysaccharides (NIFEREX) 150 MG capsule Take 150 mg by mouth daily.     Yes Historical Provider, MD  levothyroxine (SYNTHROID, LEVOTHROID) 100 MCG tablet Take 1 tablet (100 mcg total) by mouth daily. 03/13/12  Yes Romero Belling, MD  losartan (COZAAR) 100 MG tablet Take 1 tablet (100 mg total) by mouth daily. 08/01/12  Yes Tonny Bollman, MD  nitroGLYCERIN (NITROSTAT) 0.4 MG SL tablet Place 1 tablet (0.4 mg total) under the tongue every 5 (five) minutes as needed for chest pain. 10/30/12  Yes Tonny Bollman, MD  ONE TOUCH ULTRA TEST test strip 1 each by  Other route daily as needed (blood sugar).  07/10/13  Yes Historical Provider, MD  pioglitazone (ACTOS) 15 MG tablet Take 15 mg by mouth daily.  08/12/13  Yes Historical Provider, MD  NONFORMULARY OR COMPOUNDED ITEM Anacetrapib 100mg  or matching placebo daily (Reveal study drug) 03/19/13   Tonny Bollman, MD   BP 117/71  Pulse 61  Temp(Src) 98 F (36.7 C) (Oral)  Resp 15  Ht 5\' 7"  (1.702 m)  Wt 235 lb (106.595 kg)  BMI 36.80 kg/m2  SpO2 100% Physical Exam  Constitutional: She is oriented to person,  place, and time. She appears well-developed and well-nourished. No distress.  HENT:  Head: Normocephalic and atraumatic.  Eyes: EOM are normal. Pupils are equal, round, and reactive to light.  Neck: Normal range of motion. Neck supple. No thyromegaly present.  Left carotid bruit  Cardiovascular: Normal rate and regular rhythm.  Exam reveals no gallop and no friction rub.   No murmur heard. Pulmonary/Chest: Effort normal and breath sounds normal. She has no wheezes. She has no rales.  Abdominal: Soft. Bowel sounds are normal. There is no tenderness.  Musculoskeletal: Normal range of motion. She exhibits edema ( 2+ pitting edema bilateral lower extremities, patient states is stable).  Lymphadenopathy:    She has no cervical adenopathy.  Neurological: She is alert and oriented to person, place, and time.  Skin: She is not diaphoretic.    ED Course  Procedures (including critical care time) Labs Review Labs Reviewed  I-STAT CHEM 8, ED - Abnormal; Notable for the following:    BUN 41 (*)    Creatinine, Ser 1.20 (*)    Glucose, Bld 155 (*)    All other components within normal limits  TROPONIN I  TROPONIN I  TROPONIN I  COMPREHENSIVE METABOLIC PANEL  LIPASE, BLOOD  MAGNESIUM  HEPARIN LEVEL (UNFRACTIONATED)  I-STAT TROPOININ, ED    Imaging Review Dg Chest 2 View  11/29/2013   CLINICAL DATA:  Chest pain and weakness  EXAM: CHEST  2 VIEW  COMPARISON:  Radiograph 10/28/2012  FINDINGS:  Left-sided pacemaker overlies normal cardiac silhouette. Sternotomy wires are noted. There is mild central venous pulmonary congestion unchanged. No pulmonary edema. No pneumothorax.  IMPRESSION: Mild central venous pulmonary congestion.  No interval change.   Electronically Signed   By: Genevive BiStewart  Edmunds M.D.   On: 11/29/2013 13:07     EKG Interpretation   Date/Time:  Friday November 29 2013 11:04:31 EDT Ventricular Rate:  62 PR Interval:  202 QRS Duration: 78 QT Interval:  404 QTC Calculation: 410 R Axis:   38 Text Interpretation:  Normal sinus rhythm Low voltage QRS Cannot rule out  Anterior infarct , age undetermined ST \\T \ T wave abnormality, consider  inferolateral ischemia Abnormal ECG No significant change since last  tracing Confirmed by BEATON  MD, ROBERT ([id number]) on 11/29/2013 11:22:30 AM      MDM   Final diagnoses:  Unstable angina    Patient is a 80 year old female with history of coronary artery disease, diastolic heart failure, hypertension, diabetes who presents with exertional chest pain and dyspnea, concerning for ACS. EKG with no evidence of new ischemic event.  - I-STAT chem 8 and troponin  - Chest x-ray  - Zofran for nausea  1:31 PM  - Troponin negative.  - Chest x-ray only remarkable for mild venous pulmonary congestion that is stable.  - Creatinine elevated at 1.2, though stable with results from last several months.   4:40 PM  - Cardiology to admit with possible catheterization.   Harold BarbanLawrence Sherald Balbuena, MD 11/29/13 [phone]

## 2013-11-29 NOTE — H&P (Signed)
Samantha SneddonShirley A [name] is an 80 y.o. female.    Primary Cardiologist:Dr. Excell Seltzerooper PCP:  Geraldo PitterBLAND,VEITA J, MD  Chief Complaint: chest pain beginning last night HPI: 80 year old female with hx s/p PCI with DES 2008, PCI of RCA, LCX & LAD 2009 and CABG 2011 (LIMA-LAD:VG -1st diag; seq VG-OM 1,2; VG-PDA.), hypertension, type II diabetes, bradycardia and syncope resulting in PPM. Last echo 02/2012 with EF 55-60% Features are consistent with a pseudonormal left ventricular filling pattern, with concomitant abnormal relaxation and increased filling pressure (grade 2 diastolic dysfunction).Mitral valve: Moderately calcified annulus. Trivialregurgitation.- Left atrium: The atrium was mildly dilated.- Right ventricle: Poorly visualized. The cavity size was mildly dilated. Systolic function was normal.- Right atrium: The atrium was mildly dilated.- Tricuspid valve: Peak RV-RA gradient: 29mm Hg (S).- Pulmonary arteries: PA peak pressure: 39mm Hg (S),assuming RA pressure 10 mmHg. - Systemic veins: IVC was not visualized.  No stress tests since CABG.  Today she presents after chest pain that started last evening. It was lt ant chest pain, tightness that would resolve with NTG but would then return.  By 5 AM it radiated to her Lt arm.  + nausea, no diaphoresis and SOB only with walking. She took 3-4 NTG over the course of the night. This AM her family brought her to the ER.    Currently no pain, some nausea.  EKG with a pacing with deep t wave inversions. In II, III, AVF, V3-6.  (TWI unchanged from 2014 ECG)  Troponin is negative.   She has DOE and on last visit with Dr. Excell Seltzerooper she had edema of lower ext.so her bumex was increased to 2 mg. She did not tolerate bumex, so it was changed to lasix.   Her edema improved but she has some chronic DOE.    Past Medical History  Diagnosis Date  . CAD (coronary artery disease) 2009    Multivessel s/p PCI w/DES 2009 and CABG 2011  . HTN (hypertension)   . Type II or  unspecified type diabetes mellitus without mention of complication, not stated as uncontrolled   . PVD (peripheral vascular disease)   . Diverticulosis of colon   . Morbid obesity   . Gout   . Depression   . Osteoarthritis   . Osteopenia   . Hyperlipidemia   . LBP (low back pain)     Lumbar disc disease/lumbar spinal stenosis  . Disc disease, degenerative, cervical   . History of thrombocytopenia   . Anxiety   . GERD (gastroesophageal reflux disease)   . Barrett esophagus   . Gastroparesis   . Helicobacter pylori gastritis   . Allergic rhinitis   . Anemia   . Myocardial infarction   . Hypothyroidism   . H/O hiatal hernia   . Sick sinus syndrome     Dual-chamber PPM implant 02/2012  . CKD (chronic kidney disease), stage II     GFR 60-89 ml/min    Past Surgical History  Procedure Laterality Date  . Coronary stent placement      Drug-eluting stent to the left anterior descending, circumflex and right coronary artery in Jan 2009  . Cholecystectomy    . Abdominal hysterectomy    . Tubal ligation    . Ovarian cyst removal    . Coronary artery bypass graft  2011    LIMA-LAD, SVG-DIAG, SVG-OM1-OM2, SVG-PDA  . Eye surgery      BIL CATARACT REMOVAL 06/2010  . Shoulder arthroscopy  06/16/2011    Procedure: ARTHROSCOPY SHOULDER;  Surgeon: Kennieth Rad, MD;  Location: [facility] OR;  Service: Orthopedics;  Laterality: Left;  LEFT SHOULDER ARTHROSCOPY ACROMIALPLASTY, POSSIBLE MINI OPEN CUFF REPAIR   . Cardiac catheterization      2011  DR COOPER (APPT NEXT WEEK)  . Pacemaker insertion  03/06/12    MDT Adapta L implanted by Dr Johney Frame for SSS    Family History  Problem Relation Age of Onset  . Diabetes Mother   . Hypertension Mother   . Stroke Father   . Coronary artery disease Other    Social History:  reports that she has quit smoking. She has never used smokeless tobacco. She reports that she does not drink alcohol or use illicit drugs.  Allergies:  Allergies  Allergen  Reactions  . Ciprofloxacin Nausea And Vomiting    syncope  . Codeine Other (See Comments)    HALLUCINATIONS  . Diltiazem Hcl Other (See Comments)    : low heart rate  . Hydrocodone Other (See Comments)    Makes her pass out  . Lovastatin Other (See Comments)    Pt doesn't remember a reaction  . Metformin Other (See Comments)    diarrhea  . Penicillins Hives  . Shellfish Allergy Hives  . Sulfonamide Derivatives Nausea And Vomiting    No current facility-administered medications on file prior to encounter.   Current Outpatient Prescriptions on File Prior to Encounter  Medication Sig Dispense Refill  . acetaminophen (TYLENOL) 500 MG tablet Take 500 mg by mouth 2 (two) times daily as needed for pain.       Marland Kitchen allopurinol (ZYLOPRIM) 100 MG tablet Take 100 mg by mouth daily.      Marland Kitchen amLODipine (NORVASC) 5 MG tablet Take 5 mg by mouth daily.       Marland Kitchen aspirin 81 MG chewable tablet Chew 81 mg by mouth every evening.       Marland Kitchen atorvastatin (LIPITOR) 20 MG tablet Take one tablet by mouth daily--Reveal study Drug  1 tablet  0  . chlorthalidone (HYGROTON) 25 MG tablet Take 1 tablet (25 mg total) by mouth daily.  30 tablet  0  . clopidogrel (PLAVIX) 75 MG tablet Take 1 tablet (75 mg total) by mouth daily.  30 tablet  2  . colchicine 0.6 MG tablet Take 0.6 mg by mouth as needed.      . diclofenac (VOLTAREN) 75 MG EC tablet Take 75 mg by mouth 2 (two) times daily.       . furosemide (LASIX) 40 MG tablet Take 1 tablet (40 mg total) by mouth daily.  90 tablet  3  . hydrALAZINE (APRESOLINE) 25 MG tablet Take 25 mg by mouth daily.       . iron polysaccharides (NIFEREX) 150 MG capsule Take 150 mg by mouth daily.        Marland Kitchen levothyroxine (SYNTHROID, LEVOTHROID) 100 MCG tablet Take 1 tablet (100 mcg total) by mouth daily.  30 tablet  0  . losartan (COZAAR) 100 MG tablet Take 1 tablet (100 mg total) by mouth daily.  90 tablet  3  . nitroGLYCERIN (NITROSTAT) 0.4 MG SL tablet Place 1 tablet (0.4 mg total) under  the tongue every 5 (five) minutes as needed for chest pain.  25 tablet  3  . ONE TOUCH ULTRA TEST test strip 1 each by Other route daily as needed (blood sugar).       . pioglitazone (ACTOS) 15 MG tablet Take 15 mg by  mouth daily.       . NONFORMULARY OR COMPOUNDED ITEM Anacetrapib 100mg  or matching placebo daily (Reveal study drug)         Results for orders placed during the hospital encounter of 11/29/13 (from the past 48 hour(s))  I-STAT TROPOININ, ED     Status: None   Collection Time    11/29/13  1:23 PM      Result Value Ref Range   Troponin i, poc 0.00  0.00 - 0.08 ng/mL   Comment 3            Comment: Due to the release kinetics of cTnI,     a negative result within the first hours     of the onset of symptoms does not rule out     myocardial infarction with certainty.     If myocardial infarction is still suspected,     repeat the test at appropriate intervals.  I-STAT CHEM 8, ED     Status: Abnormal   Collection Time    11/29/13  1:26 PM      Result Value Ref Range   Sodium 138  137 - 147 mEq/L   Potassium 4.6  3.7 - 5.3 mEq/L   Chloride 104  96 - 112 mEq/L   BUN 41 (*) 6 - 23 mg/dL   Creatinine, Ser 1.61 (*) 0.50 - 1.10 mg/dL   Glucose, Bld 096 (*) 70 - 99 mg/dL   Calcium, Ion 0.45  4.09 - 1.30 mmol/L   TCO2 27  0 - 100 mmol/L   Hemoglobin 12.6  12.0 - 15.0 g/dL   HCT 81.1  91.4 - 78.2 %   Dg Chest 2 View  11/29/2013   CLINICAL DATA:  Chest pain and weakness  EXAM: CHEST  2 VIEW  COMPARISON:  Radiograph 10/28/2012  FINDINGS: Left-sided pacemaker overlies normal cardiac silhouette. Sternotomy wires are noted. There is mild central venous pulmonary congestion unchanged. No pulmonary edema. No pneumothorax.  IMPRESSION: Mild central venous pulmonary congestion.  No interval change.   Electronically Signed   By: Genevive Bi M.D.   On: 11/29/2013 13:07    ROS: General:no colds or fevers, no weight changes Skin:no rashes or ulcers HEENT:no blurred vision, no  congestion CV:see HPI PUL:see HPI GI:no diarrhea constipation or melena, no indigestion GU:no hematuria, no dysuria MS:no joint pain, no claudication Neuro:no syncope, no lightheadedness Endo:+ diabetes- last hgb A1C 7.5, + thyroid disease   Blood pressure 148/49, pulse 60, temperature 98 F (36.7 C), temperature source Oral, resp. rate 16, height 5\' 7"  (1.702 m), weight 235 lb (106.595 kg), SpO2 100.00%. PE: General:Pleasant affect, NAD Skin:Warm and dry, brisk capillary refill HEENT:normocephalic, sclera clear, mucus membranes moist Neck:supple, no JVD, + lt carotid bruit for doppler in Sept  Heart:S1S2 RRR without murmur, gallup, rub or click Lungs:clear without rales, rhonchi, or wheezes NFA:OZHYQ, soft, non tender, + BS, do not palpate liver spleen or masses Ext:1+ lower ext edema lt > rt, 2+ pedal pulses, 2+ radial pulses Neuro:alert and oriented X 3, MAE, follows commands, + facial symmetry    Assessment/Plan Principal Problem:   Unstable angina, negative troponin initial, pain would resolve with NTG, recheck troponin, would keep overnight and rule out, she should have myoview study 4 years out of CABG, if troponins positive would need cath.  Active Problems:   CORONARY ARTERY DISEASE, hx of PCI to LCX, RCA, LAD then CABG 2011   HYPERLIPIDEMIA   CKD (chronic kidney disease) stage 2, GFR  60-89 ml/min   DM (diabetes mellitus) MD to see for further plan.   [facility] Nurse Practitioner Certified [facility] Pager [phone] or after 5pm or weekends call [phone] 11/29/2013, 3:55 PM   Patient seen and examined with Nada Boozer, NP. We discussed all aspects of the encounter. I agree with the assessment and plan as stated above.   Chest pain radiating to left arm and NTG responsive concerning for [place]. ECG and Trop ok. Would admit for full rule out. Place on heparin, NTG paste. Treat with asa, b-blocker, statin. Probable cath Monday.   Truman Hayward 4:36 PM

## 2013-11-29 NOTE — ED Notes (Signed)
Cardiology at bedside.

## 2013-11-29 NOTE — ED Notes (Signed)
IV team at bedside 

## 2013-11-29 NOTE — Progress Notes (Signed)
ANTICOAGULATION CONSULT NOTE - Initial Consult  Pharmacy Consult for Heparin  Indication: chest pain/ACS  Allergies  Allergen Reactions  . Ciprofloxacin Nausea And Vomiting    syncope  . Codeine Other (See Comments)    HALLUCINATIONS  . Diltiazem Hcl Other (See Comments)    : low heart rate  . Hydrocodone Other (See Comments)    Makes her pass out  . Lovastatin Other (See Comments)    Pt doesn't remember a reaction  . Metformin Other (See Comments)    diarrhea  . Penicillins Hives  . Shellfish Allergy Hives  . Sulfonamide Derivatives Nausea And Vomiting    Patient Measurements: Height: 5\' 7"  (170.2 cm) Weight: 235 lb (106.595 kg) IBW/kg (Calculated) : 61.6 Heparin dosing weight: 86 kg  Vital Signs: Temp: 98 F (36.7 C) (08/21 1105) Temp src: Oral (08/21 1105) BP: 117/71 mmHg (08/21 1557) Pulse Rate: 61 (08/21 1557)  Labs:  Recent Labs  11/29/13 1326  HGB 12.6  HCT 37.0  CREATININE 1.20*    Estimated Creatinine Clearance: 47.8 ml/min (by C-G formula based on Cr of 1.2).   Medical History: Past Medical History  Diagnosis Date  . CAD (coronary artery disease) 2009    Multivessel s/p PCI w/DES 2009 and CABG 2011  . HTN (hypertension)   . Type II or unspecified type diabetes mellitus without mention of complication, not stated as uncontrolled   . PVD (peripheral vascular disease)   . Diverticulosis of colon   . Morbid obesity   . Gout   . Depression   . Osteoarthritis   . Osteopenia   . Hyperlipidemia   . LBP (low back pain)     Lumbar disc disease/lumbar spinal stenosis  . Disc disease, degenerative, cervical   . History of thrombocytopenia   . Anxiety   . GERD (gastroesophageal reflux disease)   . Barrett esophagus   . Gastroparesis   . Helicobacter pylori gastritis   . Allergic rhinitis   . Anemia   . Myocardial infarction   . Hypothyroidism   . H/O hiatal hernia   . Sick sinus syndrome     Dual-chamber PPM implant 02/2012  . CKD (chronic  kidney disease), stage II     GFR 60-89 ml/min    Assessment: [age] YOF with multiple medical problems, presented with chest pain, pharmacy is consulted to start IV heparin for ACS. Current hgb 12.6, other labs pending. Not on anticoagulant at home.   Goal of Therapy:  Heparin level 0.3-0.7 units/ml Monitor platelets by anticoagulation protocol: Yes   Plan:  Heparin bolus 4000 units bolus  Heparin infusion 1050 units/hr F/u 8 hr heparin level at 0100 Daily heparin level and CBC  Bayard HuggerMei Lousie Calico, PharmD, BCPS  Clinical Pharmacist  Pager: [phone]   11/29/2013,4:32 PM

## 2013-11-29 NOTE — Telephone Encounter (Signed)
New message    Patient calling C/O discomfort in chest . H/O pacemaker.   Pain goes / comes level about a 8 .

## 2013-11-29 NOTE — ED Notes (Signed)
Attempted IV start, IV team will try. IV team at bedside now.

## 2013-11-29 NOTE — ED Notes (Signed)
Attempted report 

## 2013-11-29 NOTE — ED Notes (Signed)
2 RNs attempted IV access with no success.

## 2013-11-29 NOTE — ED Notes (Signed)
Pt here for CP that began last night and was a intermittent cramping pain that would go away with nitro and come back after.  Pt has nausea and no other symptoms

## 2013-11-30 LAB — BASIC METABOLIC PANEL
ANION GAP: 15 (ref 5–15)
BUN: 33 mg/dL — ABNORMAL HIGH (ref 6–23)
CHLORIDE: 105 meq/L (ref 96–112)
CO2: 22 meq/L (ref 19–32)
Calcium: 9.6 mg/dL (ref 8.4–10.5)
Creatinine, Ser: 1.16 mg/dL — ABNORMAL HIGH (ref 0.50–1.10)
GFR calc Af Amer: 51 mL/min — ABNORMAL LOW (ref >90)
GFR calc non Af Amer: 44 mL/min — ABNORMAL LOW (ref >90)
Glucose, Bld: 138 mg/dL — ABNORMAL HIGH (ref 70–99)
Potassium: 4.5 mEq/L (ref 3.7–5.3)
SODIUM: 142 meq/L (ref 137–147)

## 2013-11-30 LAB — CBC
HCT: 36.1 % (ref 36.0–46.0)
Hemoglobin: 11.3 g/dL — ABNORMAL LOW (ref 12.0–15.0)
MCH: 29.4 pg (ref 26.0–34.0)
MCHC: 31.3 g/dL (ref 30.0–36.0)
MCV: 94 fL (ref 78.0–100.0)
PLATELETS: 215 10*3/uL (ref 150–400)
RBC: 3.84 MIL/uL — AB (ref 3.87–5.11)
RDW: 13.9 % (ref 11.5–15.5)
WBC: 10.5 10*3/uL (ref 4.0–10.5)

## 2013-11-30 LAB — HEPARIN LEVEL (UNFRACTIONATED)
HEPARIN UNFRACTIONATED: 0.6 [IU]/mL (ref 0.30–0.70)
Heparin Unfractionated: 0.49 IU/mL (ref 0.30–0.70)

## 2013-11-30 LAB — GLUCOSE, CAPILLARY
GLUCOSE-CAPILLARY: 149 mg/dL — AB (ref 70–99)
Glucose-Capillary: 143 mg/dL — ABNORMAL HIGH (ref 70–99)
Glucose-Capillary: 123 mg/dL — ABNORMAL HIGH (ref 70–99)
Glucose-Capillary: 200 mg/dL — ABNORMAL HIGH (ref 70–99)

## 2013-11-30 LAB — PROTIME-INR
INR: 1.1 (ref 0.00–1.49)
PROTHROMBIN TIME: 14.2 s (ref 11.6–15.2)

## 2013-11-30 LAB — HEMOGLOBIN A1C
HEMOGLOBIN A1C: 6.6 % — AB (ref <5.7)
Mean Plasma Glucose: 143 mg/dL — ABNORMAL HIGH (ref <117)

## 2013-11-30 LAB — LIPID PANEL
CHOLESTEROL: 136 mg/dL (ref 0–200)
HDL: 90 mg/dL (ref >39)
LDL Cholesterol: 25 mg/dL (ref 0–99)
TRIGLYCERIDES: 107 mg/dL (ref <150)
Total CHOL/HDL Ratio: 1.5 RATIO
VLDL: 21 mg/dL (ref 0–40)

## 2013-11-30 LAB — TROPONIN I: Troponin I: <0.3 ng/mL (ref <0.30)

## 2013-11-30 MED ORDER — DICLOFENAC SODIUM 75 MG PO TBEC
75.0000 mg | DELAYED_RELEASE_TABLET | Freq: Two times a day (BID) | ORAL | Status: DC
  Administered 2013-11-30 – 2013-12-01 (×2): 75 mg via ORAL
  Filled 2013-11-30 (×3): qty 1

## 2013-11-30 NOTE — Progress Notes (Signed)
Patient Name: [name] Date of Encounter: 11/30/2013     Principal Problem:   Unstable angina Active Problems:   HYPERLIPIDEMIA   CORONARY ARTERY DISEASE, hx of PCI to LCX, RCA, LAD then CABG 2011   CKD (chronic kidney disease) stage 2, GFR 60-89 ml/min   DM (diabetes mellitus)    SUBJECTIVE  Feeling okay. Some self limited CP this AM.  CURRENT MEDS . allopurinol  100 mg Oral Q2000  . amLODipine  5 mg Oral Q2000  . aspirin  324 mg Oral NOW   Or  . aspirin  300 mg Rectal NOW  . aspirin EC  81 mg Oral Q2000  . atorvastatin  20 mg Oral Q2000  . chlorthalidone  25 mg Oral Q2000  . clopidogrel  75 mg Oral Q2000  . furosemide  40 mg Oral Daily  . hydrALAZINE  25 mg Oral Q2000  . insulin aspart  0-5 Units Subcutaneous QHS  . insulin aspart  0-9 Units Subcutaneous TID WC  . insulin NPH Human  15 Units Subcutaneous BID AC & HS  . iron polysaccharides  150 mg Oral Q2000  . levothyroxine  100 mcg Oral QAC breakfast  . losartan  100 mg Oral Q2000  . metoprolol tartrate  12.5 mg Oral BID  . nitroGLYCERIN  0.5 inch Topical 4 times per day  . pioglitazone  15 mg Oral Q2000    OBJECTIVE  Filed Vitals:   11/29/13 1708 11/29/13 1830 11/29/13 2031 11/30/13 0441  BP: 175/45 160/55 128/43 132/50  Pulse: 60 60 59 60  Temp:  98.2 F (36.8 C) 98.1 F (36.7 C) 98.9 F (37.2 C)  TempSrc:  Oral Oral Oral  Resp: 19 20 18 17   Height:      Weight:    234 lb 9.6 oz (106.414 kg)  SpO2: 100% 100% 100% 98%    Intake/Output Summary (Last 24 hours) at 11/30/13 0859 Last data filed at 11/30/13 0500  Gross per 24 hour  Intake    120 ml  Output    700 ml  Net   -580 ml   Filed Weights   11/29/13 1105 11/30/13 0441  Weight: 235 lb (106.595 kg) 234 lb 9.6 oz (106.414 kg)    PHYSICAL EXAM  General: Pleasant, NAD. Sleepy. Obese Neuro: Alert and oriented X 3. Moves all extremities spontaneously. Psych: Normal affect. HEENT:  Normal  Neck: Supple without bruits or  JVD. Lungs:  Resp regular and unlabored, expiratory wheezing  Heart: RRR no s3, s4, + murmur Abdomen: Soft, non-tender, non-distended, BS + x 4.  Extremities: No clubbing, cyanosis or edema. DP/PT/Radials 2+ and equal bilaterally.  Accessory Clinical Findings  CBC  Recent Labs  11/29/13 1326 11/30/13 0422  WBC  --  10.5  HGB 12.6 11.3*  HCT 37.0 36.1  MCV  --  94.0  PLT  --  215   Basic Metabolic Panel  Recent Labs  11/29/13 1602 11/30/13 0422  NA 140 142  K 4.1 4.5  CL 103 105  CO2 25 22  GLUCOSE 196* 138*  BUN 30* 33*  CREATININE 1.01 1.16*  CALCIUM 9.1 9.6  MG 2.2  --    Liver Function Tests  Recent Labs  11/29/13 1602  AST 20  ALT 20  ALKPHOS 91  BILITOT 0.3  PROT 6.6  ALBUMIN 3.2*    Recent Labs  11/29/13 1602  LIPASE 31   Cardiac Enzymes  Recent Labs  11/29/13 1602 11/29/13 2226 11/30/13 0422  TROPONINI <0.30 <0.30 <0.30    Fasting Lipid Panel  Recent Labs  11/30/13 0422  CHOL 136  HDL 90  LDLCALC 25  TRIG 107  CHOLHDL 1.5     TELE  Paced. Freq missed beats and PVCs  Radiology/Studies  Dg Chest 2 View  11/29/2013   CLINICAL DATA:  Chest pain and weakness  EXAM: CHEST  2 VIEW  COMPARISON:  Radiograph 10/28/2012  FINDINGS: Left-sided pacemaker overlies normal cardiac silhouette. Sternotomy wires are noted. There is mild central venous pulmonary congestion unchanged. No pulmonary edema. No pneumothorax.  IMPRESSION: Mild central venous pulmonary congestion.  No interval change.   ASSESSMENT AND PLAN  [name] is a 80 y.o. female with a history of CAD s/p multiple PCIs and CABG 2011 (LIMA-LAD:VG -1st diag; seq VG-OM 1,2; VG-PDA.), hypertension, type II diabetes, bradycardia and syncope resulting in PPM who presented to [facility] yesterday with chest pain concerning for [place].   [place]/CAD- Chest pain radiating to left arm and NTG responsive.  -- Troponin neg x3. ECG with no acute ST or TW changes -- Continue ASA/plavix, BB,  statin. NTG. -- Cath Monday likely per Dr. Clarise Cruz.   DM- SSI   HTN- well controlled on metoprolol 12.5 mg BID, losartan 100mg , amlodipine 5mg  qd, hydralazine 25mg    [name]  Pager [phone]  Patient examined chart reviewed agree with cath on Monday Hold chlorthalidone / diuretic prior to cath  [facility]

## 2013-11-30 NOTE — Progress Notes (Signed)
ANTICOAGULATION CONSULT NOTE - Follow Up Consult  Pharmacy Consult for Heparin  Indication: chest pain/ACS  Allergies  Allergen Reactions  . Ciprofloxacin Nausea And Vomiting    syncope  . Codeine Other (See Comments)    HALLUCINATIONS  . Diltiazem Hcl Other (See Comments)    : low heart rate  . Hydrocodone Other (See Comments)    Makes her pass out  . Lovastatin Other (See Comments)    Pt doesn't remember a reaction  . Metformin Other (See Comments)    diarrhea  . Penicillins Hives  . Shellfish Allergy Hives  . Sulfonamide Derivatives Nausea And Vomiting    Patient Measurements: Height:  (170.2 cm) Weight: 234 lb 9.6 oz (106.414 kg) IBW/kg (Calculated) : 61.6  Vital Signs: Temp: 98.9 F (37.2 C) (08/22 0441) Temp src: Oral (08/22 0441) BP: 132/50 mmHg (08/22 0441) Pulse Rate: 60 (08/22 0441)  Labs:  Recent Labs  11/29/13 1326 11/29/13 1602 11/29/13 2226 11/30/13 0422  HGB 12.6  --   --  11.3*  HCT 37.0  --   --  36.1  PLT  --   --   --  215  LABPROT  --   --   --  14.2  INR  --   --   --  1.10  HEPARINUNFRC  --   --   --  0.60  CREATININE 1.20* 1.01  --  1.16*  TROPONINI  --  <0.30 <0.30 <0.30    Estimated Creatinine Clearance: 49.4 ml/min (by C-G formula based on Cr of 1.16).   Assessment: Therapeutic heparin level. Other labs as above.   Goal of Therapy:  Heparin level 0.3-0.7 units/ml Monitor platelets by anticoagulation protocol: Yes   Plan:  -Continue heparin at 1050 units/hr -1200 HL -Daily CBC/HL -Monitor for bleeding  Abran Duke 11/30/2013,7:39 AM

## 2013-11-30 NOTE — Progress Notes (Signed)
ANTICOAGULATION CONSULT NOTE - Follow Up Consult  Pharmacy Consult for Heparin  Indication: chest pain/ACS  Allergies  Allergen Reactions  . Ciprofloxacin Nausea And Vomiting    syncope  . Codeine Other (See Comments)    HALLUCINATIONS  . Diltiazem Hcl Other (See Comments)    : low heart rate  . Hydrocodone Other (See Comments)    Makes her pass out  . Lovastatin Other (See Comments)    Pt doesn't remember a reaction  . Metformin Other (See Comments)    diarrhea  . Penicillins Hives  . Shellfish Allergy Hives  . Sulfonamide Derivatives Nausea And Vomiting    Patient Measurements: Height:  (170.2 cm) Weight: 234 lb 9.6 oz (106.414 kg) IBW/kg (Calculated) : 61.6  Vital Signs: Temp: 98.9 F (37.2 C) (08/22 0441) Temp src: Oral (08/22 0441) BP: 100/52 mmHg (08/22 1016) Pulse Rate: 60 (08/22 0441)  Labs:  Recent Labs  11/29/13 1326 11/29/13 1602 11/29/13 2226 11/30/13 0422 11/30/13 1140  HGB 12.6  --   --  11.3*  --   HCT 37.0  --   --  36.1  --   PLT  --   --   --  215  --   LABPROT  --   --   --  14.2  --   INR  --   --   --  1.10  --   HEPARINUNFRC  --   --   --  0.60 0.49  CREATININE 1.20* 1.01  --  1.16*  --   TROPONINI  --  <0.30 <0.30 <0.30  --     Estimated Creatinine Clearance: 49.4 ml/min (by C-G formula based on Cr of 1.16).   Assessment: 81 YOF on heparin infusion for ACS. Heparin level this afternoon remains therapeutic on 1050 units/hr. Per RN, no s/s of bleeding noted. Heparin drip running well.   Goal of Therapy:  Heparin level 0.3-0.7 units/ml Monitor platelets by anticoagulation protocol: Yes   Plan:  -Continue heparin at 1050 units/hr -Daily CBC/HL -Monitor for bleeding  Vinnie Level, PharmD.  Clinical Pharmacist Pager [phone]

## 2013-12-01 LAB — CBC
HCT: 32.2 % — ABNORMAL LOW (ref 36.0–46.0)
Hemoglobin: 10.5 g/dL — ABNORMAL LOW (ref 12.0–15.0)
MCH: 30.7 pg (ref 26.0–34.0)
MCHC: 32.6 g/dL (ref 30.0–36.0)
MCV: 94.2 fL (ref 78.0–100.0)
Platelets: 187 10*3/uL (ref 150–400)
RBC: 3.42 MIL/uL — AB (ref 3.87–5.11)
RDW: 13.9 % (ref 11.5–15.5)
WBC: 10.2 10*3/uL (ref 4.0–10.5)

## 2013-12-01 LAB — BASIC METABOLIC PANEL
ANION GAP: 12 (ref 5–15)
BUN: 48 mg/dL — ABNORMAL HIGH (ref 6–23)
CALCIUM: 8.8 mg/dL (ref 8.4–10.5)
CO2: 26 meq/L (ref 19–32)
Chloride: 100 mEq/L (ref 96–112)
Creatinine, Ser: 1.64 mg/dL — ABNORMAL HIGH (ref 0.50–1.10)
GFR calc Af Amer: 33 mL/min — ABNORMAL LOW (ref >90)
GFR calc non Af Amer: 29 mL/min — ABNORMAL LOW (ref >90)
Glucose, Bld: 218 mg/dL — ABNORMAL HIGH (ref 70–99)
Potassium: 4.5 mEq/L (ref 3.7–5.3)
SODIUM: 138 meq/L (ref 137–147)

## 2013-12-01 LAB — GLUCOSE, CAPILLARY
Glucose-Capillary: 128 mg/dL — ABNORMAL HIGH (ref 70–99)
Glucose-Capillary: 183 mg/dL — ABNORMAL HIGH (ref 70–99)
Glucose-Capillary: 151 mg/dL — ABNORMAL HIGH (ref 70–99)
Glucose-Capillary: 129 mg/dL — ABNORMAL HIGH (ref 70–99)

## 2013-12-01 LAB — HEPARIN LEVEL (UNFRACTIONATED)
HEPARIN UNFRACTIONATED: 0.45 [IU]/mL (ref 0.30–0.70)
Heparin Unfractionated: 0.22 IU/mL — ABNORMAL LOW (ref 0.30–0.70)

## 2013-12-01 MED ORDER — SODIUM CHLORIDE 0.9 % IJ SOLN: 3.0000 mL | Freq: Two times a day (BID) | INTRAMUSCULAR | Status: DC

## 2013-12-01 MED ORDER — SODIUM CHLORIDE 0.9 % IJ SOLN: 3.0000 mL | INTRAMUSCULAR | Status: DC | PRN

## 2013-12-01 MED ORDER — SODIUM CHLORIDE 0.9 % IV SOLN: 250.0000 mL | INTRAVENOUS | Status: DC | PRN

## 2013-12-01 MED ORDER — ASPIRIN 81 MG PO CHEW
81.0000 mg | CHEWABLE_TABLET | ORAL | Status: AC
  Administered 2013-12-02: 81 mg via ORAL
  Filled 2013-12-01: qty 1

## 2013-12-01 MED ORDER — SODIUM CHLORIDE 0.9 % IV SOLN: 1.0000 mL/kg/h | INTRAVENOUS | Status: DC

## 2013-12-01 NOTE — Progress Notes (Signed)
ANTICOAGULATION CONSULT NOTE - Follow Up Consult  Pharmacy Consult for Heparin  Indication: chest pain/ACS  Allergies  Allergen Reactions  . Ciprofloxacin Nausea And Vomiting    syncope  . Codeine Other (See Comments)    HALLUCINATIONS  . Diltiazem Hcl Other (See Comments)    : low heart rate  . Hydrocodone Other (See Comments)    Makes her pass out  . Lovastatin Other (See Comments)    Pt doesn't remember a reaction  . Metformin Other (See Comments)    diarrhea  . Penicillins Hives  . Shellfish Allergy Hives  . Sulfonamide Derivatives Nausea And Vomiting    Patient Measurements: Height:  (170.2 cm) Weight: 242 lb 8.1 oz (110 kg) IBW/kg (Calculated) : 61.6  Vital Signs: Temp: 98.5 F (36.9 C) (08/23 1549) Temp src: Oral (08/23 1549) BP: 134/31 mmHg (08/23 1549) Pulse Rate: 54 (08/23 1549)  Labs:  Recent Labs  11/29/13 1326 11/29/13 1602 11/29/13 2226  11/30/13 0422 11/30/13 1140 12/01/13 0415 12/01/13 0909 12/01/13 1650  HGB 12.6  --   --   --  11.3*  --  10.5*  --   --   HCT 37.0  --   --   --  36.1  --  32.2*  --   --   PLT  --   --   --   --  215  --  187  --   --   LABPROT  --   --   --   --  14.2  --   --   --   --   INR  --   --   --   --  1.10  --   --   --   --   HEPARINUNFRC  --   --   --   < > 0.60 0.49 0.22*  --  0.45  CREATININE 1.20* 1.01  --   --  1.16*  --   --  1.64*  --   TROPONINI  --  <0.30 <0.30  --  <0.30  --   --   --   --   < > = values in this interval not displayed.  Estimated Creatinine Clearance: 35.6 ml/min (by C-G formula based on Cr of 1.64).   Assessment: 81 YOF on heparin infusion for ACS. Heparin drip 1250 uts/hr heparin level 0.45 at goal.  No bleeding noted, CBC stable, for cath tomorrow.   Goal of Therapy:  Heparin level 0.3-0.7 units/ml Monitor platelets by anticoagulation protocol: Yes   Plan:  -Continue heparin at 1250 units/hr -Daily CBC/HL -Monitor for bleeding   Leota Sauers Pharm.D. CPP,  BCPS Clinical Pharmacist [phone] 12/01/2013 6:22 PM

## 2013-12-01 NOTE — ED Provider Notes (Signed)
I saw and evaluated the patient, reviewed the resident's note and I agree with the findings and plan.   .Face to face Exam:  General:  Awake HEENT:  Atraumatic Resp:  Normal effort Abd:  Nondistended Neuro:No focal weakness Lymph: No adenopathy  I reviewed the ekg with the resident and discussed the findings  Nelia Shi, MD 12/01/13 2145

## 2013-12-01 NOTE — Progress Notes (Signed)
ANTICOAGULATION CONSULT NOTE  Pharmacy Consult for Heparin  Indication: chest pain/ACS  Allergies  Allergen Reactions  . Ciprofloxacin Nausea And Vomiting    syncope  . Codeine Other (See Comments)    HALLUCINATIONS  . Diltiazem Hcl Other (See Comments)    : low heart rate  . Hydrocodone Other (See Comments)    Makes her pass out  . Lovastatin Other (See Comments)    Pt doesn't remember a reaction  . Metformin Other (See Comments)    diarrhea  . Penicillins Hives  . Shellfish Allergy Hives  . Sulfonamide Derivatives Nausea And Vomiting    Patient Measurements: Height:  (170.2 cm) Weight: 234 lb 9.6 oz (106.414 kg) IBW/kg (Calculated) : 61.6  Vital Signs: Temp: 98.5 F (36.9 C) (08/23 0330) Temp src: Oral (08/23 0330) BP: 154/45 mmHg (08/23 0330) Pulse Rate: 60 (08/23 0330)  Labs:  Recent Labs  11/29/13 1326 11/29/13 1602 11/29/13 2226 11/30/13 0422 11/30/13 1140 12/01/13 0415  HGB 12.6  --   --  11.3*  --  10.5*  HCT 37.0  --   --  36.1  --  32.2*  PLT  --   --   --  215  --  187  LABPROT  --   --   --  14.2  --   --   INR  --   --   --  1.10  --   --   HEPARINUNFRC  --   --   --  0.60 0.49 0.22*  CREATININE 1.20* 1.01  --  1.16*  --   --   TROPONINI  --  <0.30 <0.30 <0.30  --   --     Estimated Creatinine Clearance: 49.4 ml/min (by C-G formula based on Cr of 1.16).  Assessment: 80 y.o. female with chest pain for heparin    Goal of Therapy:  Heparin level 0.3-0.7 units/ml Monitor platelets by anticoagulation protocol: Yes   Plan:  Increase Heparin 1250 units/hr Check heparin level in 8 hours.   Eddie Candle 12/01/2013,5:24 AM

## 2013-12-01 NOTE — Progress Notes (Signed)
Patient Name: [name] Date of Encounter: 12/01/2013     Principal Problem:   Unstable angina Active Problems:   HYPERLIPIDEMIA   CORONARY ARTERY DISEASE, hx of PCI to LCX, RCA, LAD then CABG 2011   CKD (chronic kidney disease) stage 2, GFR 60-89 ml/min   DM (diabetes mellitus)    SUBJECTIVE  No CP or SOB. Awaiting cath tomorrow.   CURRENT MEDS . allopurinol  100 mg Oral Q2000  . amLODipine  5 mg Oral Q2000  . aspirin EC  81 mg Oral Q2000  . atorvastatin  20 mg Oral Q2000  . clopidogrel  75 mg Oral Q2000  . diclofenac  75 mg Oral BID  . furosemide  40 mg Oral Daily  . hydrALAZINE  25 mg Oral Q2000  . insulin aspart  0-5 Units Subcutaneous QHS  . insulin aspart  0-9 Units Subcutaneous TID WC  . insulin NPH Human  15 Units Subcutaneous BID AC & HS  . iron polysaccharides  150 mg Oral Q2000  . levothyroxine  100 mcg Oral QAC breakfast  . losartan  100 mg Oral Q2000  . metoprolol tartrate  12.5 mg Oral BID  . nitroGLYCERIN  0.5 inch Topical 4 times per day  . pioglitazone  15 mg Oral Q2000    OBJECTIVE  Filed Vitals:   11/30/13 1336 11/30/13 1953 12/01/13 0330 12/01/13 0605  BP: 130/43 119/37 154/45   Pulse: 59 60 60   Temp: 98.3 F (36.8 C) 98.3 F (36.8 C) 98.5 F (36.9 C)   TempSrc: Oral Oral Oral   Resp: Height:      Weight:    242 lb 8.1 oz (110 kg)  SpO2: 100% 100% 99%     Intake/Output Summary (Last 24 hours) at 12/01/13 0813 Last data filed at 12/01/13 0300  Gross per 24 hour  Intake    480 ml  Output   1300 ml  Net   -820 ml   Filed Weights   11/29/13 1105 11/30/13 0441 12/01/13 0605  Weight: 235 lb (106.595 kg) 234 lb 9.6 oz (106.414 kg) 242 lb 8.1 oz (110 kg)    PHYSICAL EXAM  General: Pleasant, NAD. Sleepy. Obese Neuro: Alert and oriented X 3. Moves all extremities spontaneously. Psych: Normal affect. HEENT:  Normal  Neck: Supple without bruits or JVD. Lungs:  Resp regular and unlabored, expiratory wheezing    Heart: RRR no s3, s4, + murmur Abdomen: Soft, non-tender, non-distended, BS + x 4.  Extremities: No clubbing, cyanosis or edema. DP/PT/Radials 2+ and equal bilaterally.  Accessory Clinical Findings  CBC  Recent Labs  11/30/13 0422 12/01/13 0415  WBC 10.5 10.2  HGB 11.3* 10.5*  HCT 36.1 32.2*  MCV 94.0 94.2  PLT 215 187   Basic Metabolic Panel  Recent Labs  11/29/13 1602 11/30/13 0422  NA 140 142  K 4.1 4.5  CL 103 105  CO2 25 22  GLUCOSE 196* 138*  BUN 30* 33*  CREATININE 1.01 1.16*  CALCIUM 9.1 9.6  MG 2.2  --    Liver Function Tests  Recent Labs  11/29/13 1602  AST 20  ALT 20  ALKPHOS 91  BILITOT 0.3  PROT 6.6  ALBUMIN 3.2*    Recent Labs  11/29/13 1602  LIPASE 31   Cardiac Enzymes  Recent Labs  11/29/13 1602 11/29/13 2226 11/30/13 0422  TROPONINI <0.30 <0.30 <0.30    Fasting Lipid Panel  Recent Labs  11/30/13 0422  CHOL 136  HDL 90  LDLCALC 25  TRIG 107  CHOLHDL 1.5     TELE  Paced. Freq missed beats and PVCs  Radiology/Studies  Dg Chest 2 View  11/29/2013   CLINICAL DATA:  Chest pain and weakness  EXAM: CHEST  2 VIEW  COMPARISON:  Radiograph 10/28/2012  FINDINGS: Left-sided pacemaker overlies normal cardiac silhouette. Sternotomy wires are noted. There is mild central venous pulmonary congestion unchanged. No pulmonary edema. No pneumothorax.  IMPRESSION: Mild central venous pulmonary congestion.  No interval change.   ASSESSMENT AND PLAN  [name] is a 80 y.o. 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   [place]/CAD- Chest pain radiating to left arm and NTG responsive.  -- Troponin neg x3. ECG with no acute ST or TW changes -- Continue ASA/plavix, BB, statin. NTG and heparin gtt -- Cath Monday. Hold chlorthalidone  / diuretic prior to cath. I have placed cath orders.   DM- Hg A1c 6.6. SSI   HTN- well controlled on metoprolol 12.5 mg BID, losartan , amlodipine  qd, hydralazine    Urban Gibson PA-C  Pager [phone]  Patient examined chart reviewed agree with above Holding diuretic before cath

## 2013-12-02 ENCOUNTER — Encounter (HOSPITAL_COMMUNITY)
Admission: EM | Disposition: A | Discharge status: Home / Self Care | Payer: Self-pay | Source: Home / Self Care | Attending: Internal Medicine

## 2013-12-02 DIAGNOSIS — I2510 Atherosclerotic heart disease of native coronary artery without angina pectoris: Principal | ICD-10-CM

## 2013-12-02 HISTORY — PX: LEFT HEART CATHETERIZATION WITH CORONARY ANGIOGRAM: SHX5451

## 2013-12-02 LAB — CBC
HCT: 31 % — ABNORMAL LOW (ref 36.0–46.0)
HCT: 34.4 % — ABNORMAL LOW (ref 36.0–46.0)
HEMOGLOBIN: 10.9 g/dL — AB (ref 12.0–15.0)
Hemoglobin: 9.9 g/dL — ABNORMAL LOW (ref 12.0–15.0)
MCH: 29.8 pg (ref 26.0–34.0)
MCH: 30.4 pg (ref 26.0–34.0)
MCHC: 31.9 g/dL (ref 30.0–36.0)
MCHC: 31.7 g/dL (ref 30.0–36.0)
MCV: 93.4 fL (ref 78.0–100.0)
MCV: 95.8 fL (ref 78.0–100.0)
Platelets: 160 10*3/uL (ref 150–400)
Platelets: 157 10*3/uL (ref 150–400)
RBC: 3.32 MIL/uL — ABNORMAL LOW (ref 3.87–5.11)
RBC: 3.59 MIL/uL — ABNORMAL LOW (ref 3.87–5.11)
RDW: 13.8 % (ref 11.5–15.5)
RDW: 13.8 % (ref 11.5–15.5)
WBC: 9.9 10*3/uL (ref 4.0–10.5)
WBC: 10 10*3/uL (ref 4.0–10.5)

## 2013-12-02 LAB — BASIC METABOLIC PANEL
ANION GAP: 11 (ref 5–15)
Anion gap: 10 (ref 5–15)
BUN: 48 mg/dL — ABNORMAL HIGH (ref 6–23)
BUN: 40 mg/dL — ABNORMAL HIGH (ref 6–23)
CALCIUM: 8.9 mg/dL (ref 8.4–10.5)
CALCIUM: 8.8 mg/dL (ref 8.4–10.5)
CO2: 25 mEq/L (ref 19–32)
CO2: 25 meq/L (ref 19–32)
CREATININE: 1.25 mg/dL — AB (ref 0.50–1.10)
Chloride: 103 mEq/L (ref 96–112)
Chloride: 102 mEq/L (ref 96–112)
Creatinine, Ser: 1.35 mg/dL — ABNORMAL HIGH (ref 0.50–1.10)
GFR calc Af Amer: 42 mL/min — ABNORMAL LOW (ref >90)
GFR calc Af Amer: 46 mL/min — ABNORMAL LOW (ref >90)
GFR calc non Af Amer: 40 mL/min — ABNORMAL LOW (ref >90)
GFR, EST NON AFRICAN AMERICAN: 36 mL/min — AB (ref >90)
GLUCOSE: 134 mg/dL — AB (ref 70–99)
Glucose, Bld: 176 mg/dL — ABNORMAL HIGH (ref 70–99)
Potassium: 4.2 mEq/L (ref 3.7–5.3)
Potassium: 4.7 mEq/L (ref 3.7–5.3)
Sodium: 139 mEq/L (ref 137–147)
Sodium: 137 mEq/L (ref 137–147)

## 2013-12-02 LAB — HEPARIN LEVEL (UNFRACTIONATED): HEPARIN UNFRACTIONATED: 0.4 [IU]/mL (ref 0.30–0.70)

## 2013-12-02 LAB — GLUCOSE, CAPILLARY
Glucose-Capillary: 135 mg/dL — ABNORMAL HIGH (ref 70–99)
Glucose-Capillary: 154 mg/dL — ABNORMAL HIGH (ref 70–99)
Glucose-Capillary: 139 mg/dL — ABNORMAL HIGH (ref 70–99)
Glucose-Capillary: 97 mg/dL (ref 70–99)
Glucose-Capillary: 149 mg/dL — ABNORMAL HIGH (ref 70–99)

## 2013-12-02 SURGERY — LEFT HEART CATHETERIZATION WITH CORONARY ANGIOGRAM
Anesthesia: LOCAL

## 2013-12-02 MED ORDER — DIPHENHYDRAMINE HCL 50 MG/ML IJ SOLN
INTRAMUSCULAR | Status: AC
  Filled 2013-12-02: qty 1

## 2013-12-02 MED ORDER — MIDAZOLAM HCL 2 MG/2ML IJ SOLN
INTRAMUSCULAR | Status: AC
  Filled 2013-12-02: qty 2

## 2013-12-02 MED ORDER — NITROGLYCERIN 1 MG/10 ML FOR IR/CATH LAB
INTRA_ARTERIAL | Status: AC
  Filled 2013-12-02: qty 10

## 2013-12-02 MED ORDER — VERAPAMIL HCL 2.5 MG/ML IV SOLN
INTRAVENOUS | Status: AC
Start: 2013-12-02 — End: 2013-12-02
  Filled 2013-12-02: qty 2

## 2013-12-02 MED ORDER — LIDOCAINE HCL (PF) 1 % IJ SOLN
INTRAMUSCULAR | Status: AC
  Filled 2013-12-02: qty 30

## 2013-12-02 MED ORDER — HEPARIN SODIUM (PORCINE) 1000 UNIT/ML IJ SOLN
INTRAMUSCULAR | Status: AC
  Filled 2013-12-02: qty 1

## 2013-12-02 MED ORDER — HEPARIN (PORCINE) IN NACL 2-0.9 UNIT/ML-% IJ SOLN
INTRAMUSCULAR | Status: AC
  Filled 2013-12-02: qty 1000

## 2013-12-02 MED ORDER — ENOXAPARIN SODIUM 40 MG/0.4ML ~~LOC~~ SOLN
40.0000 mg | SUBCUTANEOUS | Status: DC
  Administered 2013-12-03: 40 mg via SUBCUTANEOUS
  Filled 2013-12-02 (×2): qty 0.4

## 2013-12-02 MED ORDER — FENTANYL CITRATE 0.05 MG/ML IJ SOLN
INTRAMUSCULAR | Status: AC
  Filled 2013-12-02: qty 2

## 2013-12-02 MED ORDER — SODIUM CHLORIDE 0.9 % IV SOLN
INTRAVENOUS | Status: AC
  Administered 2013-12-02: 12:00:00 via INTRAVENOUS

## 2013-12-02 NOTE — Progress Notes (Signed)
Patient arrived back from cath procedure with TR band placed to the Left arm containiing 13cc of air. TR band deflated following protocol. Frequent vitals set up. Band fully deflated and removed, site level 0, no bleeding. No complaints from patient, however she is still sleepy. Will continue to monitor closely. Rise Paganini, RN

## 2013-12-02 NOTE — H&P (View-Only) (Signed)
Patient Name: [name] Date of Encounter: 12/01/2013     Principal Problem:   Unstable angina Active Problems:   HYPERLIPIDEMIA   CORONARY ARTERY DISEASE, hx of PCI to LCX, RCA, LAD then CABG 2011   CKD (chronic kidney disease) stage 2, GFR 60-89 ml/min   DM (diabetes mellitus)    SUBJECTIVE  No CP or SOB. Awaiting cath tomorrow.   CURRENT MEDS . allopurinol  100 mg Oral Q2000  . amLODipine  5 mg Oral Q2000  . aspirin EC  81 mg Oral Q2000  . atorvastatin  20 mg Oral Q2000  . clopidogrel  75 mg Oral Q2000  . diclofenac  75 mg Oral BID  . furosemide  40 mg Oral Daily  . hydrALAZINE  25 mg Oral Q2000  . insulin aspart  0-5 Units Subcutaneous QHS  . insulin aspart  0-9 Units Subcutaneous TID WC  . insulin NPH Human  15 Units Subcutaneous BID AC & HS  . iron polysaccharides  150 mg Oral Q2000  . levothyroxine  100 mcg Oral QAC breakfast  . losartan  100 mg Oral Q2000  . metoprolol tartrate  12.5 mg Oral BID  . nitroGLYCERIN  0.5 inch Topical 4 times per day  . pioglitazone  15 mg Oral Q2000    OBJECTIVE  Filed Vitals:   11/30/13 1336 11/30/13 1953 12/01/13 0330 12/01/13 0605  BP: 130/43 119/37 154/45   Pulse: 59 60 60   Temp: 98.3 F (36.8 C) 98.3 F (36.8 C) 98.5 F (36.9 C)   TempSrc: Oral Oral Oral   Resp: Height:      Weight:    242 lb 8.1 oz (110 kg)  SpO2: 100% 100% 99%     Intake/Output Summary (Last 24 hours) at 12/01/13 0813 Last data filed at 12/01/13 0300  Gross per 24 hour  Intake    480 ml  Output   1300 ml  Net   -820 ml   Filed Weights   11/29/13 1105 11/30/13 0441 12/01/13 0605  Weight: 235 lb (106.595 kg) 234 lb 9.6 oz (106.414 kg) 242 lb 8.1 oz (110 kg)    PHYSICAL EXAM  General: Pleasant, NAD. Sleepy. Obese Neuro: Alert and oriented X 3. Moves all extremities spontaneously. Psych: Normal affect. HEENT:  Normal  Neck: Supple without bruits or JVD. Lungs:  Resp regular and unlabored, expiratory wheezing    Heart: RRR no s3, s4, + murmur Abdomen: Soft, non-tender, non-distended, BS + x 4.  Extremities: No clubbing, cyanosis or edema. DP/PT/Radials 2+ and equal bilaterally.  Accessory Clinical Findings  CBC  Recent Labs  11/30/13 0422 12/01/13 0415  WBC 10.5 10.2  HGB 11.3* 10.5*  HCT 36.1 32.2*  MCV 94.0 94.2  PLT 215 187   Basic Metabolic Panel  Recent Labs  11/29/13 1602 11/30/13 0422  NA 140 142  K 4.1 4.5  CL 103 105  CO2 25 22  GLUCOSE 196* 138*  BUN 30* 33*  CREATININE 1.01 1.16*  CALCIUM 9.1 9.6  MG 2.2  --    Liver Function Tests  Recent Labs  11/29/13 1602  AST 20  ALT 20  ALKPHOS 91  BILITOT 0.3  PROT 6.6  ALBUMIN 3.2*    Recent Labs  11/29/13 1602  LIPASE 31   Cardiac Enzymes  Recent Labs  11/29/13 1602 11/29/13 2226 11/30/13 0422  TROPONINI <0.30 <0.30 <0.30    Fasting Lipid Panel  Recent Labs  11/30/13 0422  CHOL 136  HDL 90  LDLCALC 25  TRIG 107  CHOLHDL 1.5     TELE  Paced. Freq missed beats and PVCs  Radiology/Studies  Dg Chest 2 View  11/29/2013   CLINICAL DATA:  Chest pain and weakness  EXAM: CHEST  2 VIEW  COMPARISON:  Radiograph 10/28/2012  FINDINGS: Left-sided pacemaker overlies normal cardiac silhouette. Sternotomy wires are noted. There is mild central venous pulmonary congestion unchanged. No pulmonary edema. No pneumothorax.  IMPRESSION: Mild central venous pulmonary congestion.  No interval change.   ASSESSMENT AND PLAN  [name] is a 80 y.o. 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   [place]/CAD- Chest pain radiating to left arm and NTG responsive.  -- Troponin neg x3. ECG with no acute ST or TW changes -- Continue ASA/plavix, BB, statin. NTG and heparin gtt -- Cath Monday. Hold chlorthalidone  / diuretic prior to cath. I have placed cath orders.   DM- Hg A1c 6.6. SSI   HTN- well controlled on metoprolol 12.5 mg BID, losartan 100mg, amlodipine 5mg qd, hydralazine 25mg   Signed, STERN, KATHRYN PA-C  Pager [phone]  Patient examined chart reviewed agree with above Holding diuretic before cath 

## 2013-12-02 NOTE — CV Procedure (Signed)
     Left Heart Catheterization with Coronary and Bypass Graft Angiography  Report  [name]  80 y.o.  female 07-31-1932  Procedure Date: 12/02/2013 Referring Physician: Charlton Haws, M.D. Primary Cardiologist: Tonny Bollman, M.D.  INDICATIONS: Prolonged chest pain compatible with unstable angina  PROCEDURE: 1. Left heart catheterization; 2. Coronary angiography; 3. Left ventriculography; 4. Bypass graft angiography; 5. Left internal mammary artery graft to LAD  CONSENT:  The risks, benefits, and details of the procedure were explained in detail to the patient. Risks including death, stroke, heart attack, kidney injury, allergy, limb ischemia, bleeding and radiation injury were discussed.  The patient verbalized understanding and wanted to proceed.  Informed written consent was obtained.  PROCEDURE TECHNIQUE:  After Xylocaine anesthesia a 5 French Slender sheath was placed in the left radial artery with an angiocath and the modified Seldinger technique.  Coronary angiography was done using a 5 F JR 4, JL4, and LBC catheters.  Left ventriculography was done using the JR 4 catheter and hand injection.   The images were reviewed and the case was terminated.  Hemostasis was achieved with a wrist band using 13 cc of air and placed at 8:43 AM.   CONTRAST:  Total of 140 cc.  COMPLICATIONS:  None   HEMODYNAMICS:  Aortic pressure 179/53 mmHg; LV pressure 184/6 mmHg; LVEDP 23 mmHg  ANGIOGRAPHIC DATA:   The left main coronary artery is patent.  The left anterior descending artery is 95% proximal in-stent restenosis. Diffuse disease throughout the remainder of the vessel that in the left ventricular apex. Reflux into the mammary is noted to reflux into the diagonal graft is also noted.  The left circumflex artery is totally occluded. Palpitation in the first obtuse marginal is noted and presumed related to graft patency there is 40% stenosis proximal to the first obtuse marginal the  distal circumflex is totally occluded.  The right coronary artery is totally occluded distally.  BYPASS GRAFT ANGIOGRAPHY: SVG to OM 1 and OM 2 is widely patent.  SVG to diagonal #1 is widely patent.  SVG to PDA is widely patent.  LIMA to LAD is widely patent   LEFT VENTRICULOGRAM:  Left ventricular angiogram was done in the 30 RAO projection and revealed normal cavity size with ejection fraction of 60%.   IMPRESSIONS:  1. Widely patent bypass grafts as noted above including the inframammary to the LAD. 2. Total occlusion of the distal RCA, distal circumflex, and 95% ISR in the proximal LAD. 3. Overall normal LV function 4. An explanation for the prolonged chest pain is not identified.   RECOMMENDATION:  This factor modification. No further ischemic evaluation. Will hold losartan until we're certain that the creatinine is not climbing and if stable in a.m. can be resumed. Potential discharge in a.m.

## 2013-12-02 NOTE — Progress Notes (Signed)
Pt woke up and feeling nauseous. Pt denies chest pain and says it is her stomach. She feels like she needs to throw up. Zofran  Iv given per orders.Will continue to assess. VS stable and CBG-135.

## 2013-12-02 NOTE — Plan of Care (Signed)
Problem: Phase I Progression Outcomes Goal: Pain controlled with appropriate interventions Outcome: Progressing Patient denies pain at cath site, denies chest pain Goal: Voiding-avoid urinary catheter unless indicated Outcome: Completed/Met Date Met:  12/02/13 Voiding in [facility] Goal: Hemodynamically stable Outcome: Progressing Vitals stable Goal: Distal pulses equal to baseline Outcome: Completed/Met Date Met:  12/02/13 Pulses palpable Goal: Vascular site scale level 0 - I Vascular Site Scale Level 0: No bruising/bleeding/hematoma Level I (Mild): Bruising/Ecchymosis, minimal bleeding/ooozing, palpable hematoma < 3 cm Level II (Moderate): Bleeding not affecting hemodynamic parameters, pseudoaneurysm, palpable hematoma > 3 cm Level III (Severe) Bleeding which affects hemodynamic parameters or retroperitoneal hemorrhage  Outcome: Completed/Met Date Met:  12/02/13 Level 0

## 2013-12-02 NOTE — Interval H&P Note (Signed)
Cath Lab Visit (complete for each Cath Lab visit)  Clinical Evaluation Leading to the Procedure:   ACS: Yes.    Non-ACS:    Anginal Classification: CCS IV  Anti-ischemic medical therapy: Minimal Therapy (1 class of medications)  Non-Invasive Test Results: No non-invasive testing performed  Prior CABG: Previous CABG      History and Physical Interval Note:  12/02/2013 7:47 AM  [name]  has presented today for surgery, with the diagnosis of cp  The various methods of treatment have been discussed with the patient and family. After consideration of risks, benefits and other options for treatment, the patient has consented to  Procedure(s): LEFT HEART CATHETERIZATION WITH CORONARY ANGIOGRAM (N/A) as a surgical intervention .  The patient's history has been reviewed, patient examined, no change in status, stable for surgery.  I have reviewed the patient's chart and labs.  Questions were answered to the patient's satisfaction.     Lesleigh Noe

## 2013-12-03 ENCOUNTER — Encounter (HOSPITAL_COMMUNITY): Payer: Self-pay | Admitting: Physician Assistant

## 2013-12-03 LAB — CBC
HEMATOCRIT: 32.7 % — AB (ref 36.0–46.0)
Hemoglobin: 10.6 g/dL — ABNORMAL LOW (ref 12.0–15.0)
MCH: 30.5 pg (ref 26.0–34.0)
MCHC: 32.4 g/dL (ref 30.0–36.0)
MCV: 94.2 fL (ref 78.0–100.0)
Platelets: 177 10*3/uL (ref 150–400)
RBC: 3.47 MIL/uL — AB (ref 3.87–5.11)
RDW: 14 % (ref 11.5–15.5)
WBC: 11.3 10*3/uL — ABNORMAL HIGH (ref 4.0–10.5)

## 2013-12-03 LAB — GLUCOSE, CAPILLARY
Glucose-Capillary: 103 mg/dL — ABNORMAL HIGH (ref 70–99)
Glucose-Capillary: 148 mg/dL — ABNORMAL HIGH (ref 70–99)

## 2013-12-03 MED ORDER — ALUM & MAG HYDROXIDE-SIMETH 200-200-20 MG/5ML PO SUSP
15.0000 mL | ORAL | Status: DC | PRN
  Administered 2013-12-03: 15 mL via ORAL
  Filled 2013-12-03: qty 30

## 2013-12-03 NOTE — Progress Notes (Signed)
Went over discharge instructions with patient. IV discontinued, patient taken off the cardiac monitor. Patient discharged home. Rise Paganini, RN

## 2013-12-03 NOTE — Care Management Note (Signed)
    Page 1 of 1   12/03/2013     4:16:16 PM CARE MANAGEMENT NOTE 12/03/2013  Patient:  [name],[name]   Account Number:  [id number]  Date Initiated:  12/03/2013  Documentation initiated by:  Fortunata Betty  Subjective/Objective Assessment:   Pt adm on 11/29/13 with chest pain.  PTA, pt resides at home with family.     Action/Plan:   Will follow for dc needs as pt progresses.   Anticipated DC Date:  12/03/2013   Anticipated DC Plan:  HOME/SELF CARE      DC Planning Services  CM consult      Choice offered to / List presented to:             Status of service:  Completed, signed off Medicare Important Message given?  YES (If response is "NO", the following Medicare IM given date fields will be blank) Date Medicare IM given:  12/02/2013 Medicare IM given by:  Dustan Hyams Date Additional Medicare IM given:   Additional Medicare IM given by:    Discharge Disposition:  HOME/SELF CARE  Per UR Regulation:  Reviewed for med. necessity/level of care/duration of stay  If discussed at Long Length of Stay Meetings, dates discussed:    Comments:

## 2013-12-03 NOTE — Discharge Summary (Signed)
CARDIOLOGY DISCHARGE SUMMARY   Patient ID: [name] MRN: [id number] DOB/AGE: Sep 14, 1932 80 y.o.  Admit date: 11/29/2013 Discharge date: 12/03/2013  PCP: Samantha Pitter, MD Primary Cardiologist: Dr. Excell [name]  Primary Discharge Diagnosis:  Unstable angina Secondary Discharge Diagnosis:    HYPERTENSION   HYPERLIPIDEMIA   CORONARY ARTERY DISEASE, hx of PCI to LCX, RCA, LAD then CABG 2011   CKD (chronic kidney disease) stage 2, GFR 60-89 ml/min   DM (diabetes mellitus)  Procedures: 1. Left heart catheterization; 2. Coronary angiography; 3. Left ventriculography; 4. Bypass graft angiography; 5. Left internal mammary artery graft to LAD   Hospital Course: [name] is a 80 y.o. female with a istory of CAD. She had prolonged chest pain, responsive to nitroglycerin. She came to the hospital where she was admitted for further evaluation and treatment.  Her cardiac enzymes were negative for MI. Her symptoms were concerning for unstable anginal pain she was taken to the cath lab on 12/02/2013.  There was concern for renal insufficiency, so her Lasix and her (were held. She was hydrated prior to her cardiac catheterization and renal function remained stable. Her diabetes was managed with a combination of her home medication and sliding scale insulin. A hemoglobin A1c was checked, results below. Compliance with a diabetic diet and medications as recommended, but she is doing well.  A lipid profile was checked, results below. Profile is favorable and no med changes are indicated.  Cardiac catheterization results are below. She has distal disease in the circumflex and RCA as well as 95% in-stent restenosis of the LAD, but all of her bypass grafts were patent. Her EF is preserved. Medical therapy is recommended.  On 08/25, she was seen by Dr. Excell [name] and all data were reviewed. Upon review her for home medications, he noted that she was taking hydralazine only once daily. This  medication was discontinued. Her blood pressure is elevated at times but there is a wide range and her readings and she has problems with dizziness sometimes. No medication changes are made at this time and she is to followup as an outpatient.  On 08/25, [name] he was ambulating without chest pain or shortness of breath and considered stable for discharge, to follow up as an outpatient.  Labs:   Lab Results  Component Value Date   WBC 11.3* 12/03/2013   HGB 10.6* 12/03/2013   HCT 32.7* 12/03/2013   MCV 94.2 12/03/2013   PLT 177 12/03/2013    Recent Labs Lab 11/29/13 1602  12/02/13 1050  NA 140  < > 137  K 4.1  < > 4.7  CL 103  < > 102  CO2 25  < > 25  BUN 30*  < > 40*  CREATININE 1.01  < > 1.25*  CALCIUM 9.1  < > 8.8  PROT 6.6  --   --   BILITOT 0.3  --   --   ALKPHOS 91  --   --   ALT 20  --   --   AST 20  --   --   GLUCOSE 196*  < > 176*  < > = values in this interval not displayed.  Lipid Panel     Component Value Date/Time   CHOL 136 11/30/2013 0422   TRIG 107 11/30/2013 0422   HDL 90 11/30/2013 0422   CHOLHDL 1.5 11/30/2013 0422   VLDL 21 11/30/2013 0422   LDLCALC 25 11/30/2013 0422    Pro B Natriuretic  peptide (BNP)  Date/Time Value Ref Range Status  09/25/2013 10:44 AM 61.0  0.0 - 100.0 pg/mL Final  09/10/2013 11:58 AM 75.0  0.0 - 100.0 pg/mL Final     Radiology: Dg Chest 2 View 11/29/2013   CLINICAL DATA:  Chest pain and weakness  EXAM: CHEST  2 VIEW  COMPARISON:  Radiograph 10/28/2012  FINDINGS: Left-sided pacemaker overlies normal cardiac silhouette. Sternotomy wires are noted. There is mild central venous pulmonary congestion unchanged. No pulmonary edema. No pneumothorax.  IMPRESSION: Mild central venous pulmonary congestion.  No interval change.   Electronically Signed   By: Genevive Bi M.D.   On: 11/29/2013 13:07    Cardiac Cath:  ANGIOGRAPHIC DATA: The left main coronary artery is patent.  The left anterior descending artery is 95% proximal in-stent  restenosis. Diffuse disease throughout the remainder of the vessel that in the left ventricular apex. Reflux into the mammary is noted to reflux into the diagonal graft is also noted.  The left circumflex artery is totally occluded. Palpitation in the first obtuse marginal is noted and presumed related to graft patency there is 40% stenosis proximal to the first obtuse marginal the distal circumflex is totally occluded.  The right coronary artery is totally occluded distally.  BYPASS GRAFT ANGIOGRAPHY: SVG to OM 1 and OM 2 is widely patent.  SVG to diagonal #1 is widely patent.  SVG to PDA is widely patent.  LIMA to LAD is widely patent  LEFT VENTRICULOGRAM: Left ventricular angiogram was done in the 30 RAO projection and revealed normal cavity size with ejection fraction of 60%.  IMPRESSIONS: 1. Widely patent bypass grafts as noted above including the inframammary to the LAD.  2. Total occlusion of the distal RCA, distal circumflex, and 95% ISR in the proximal LAD.  3. Overall normal LV function  4. An explanation for the prolonged chest pain is not identified.  RECOMMENDATION: This factor modification. No further ischemic evaluation. Will hold losartan until we're certain that the creatinine is not climbing and if stable in a.m. can be resumed. Potential discharge in a.m.  EKG: AV pacing  FOLLOW UP PLANS AND APPOINTMENTS Allergies  Allergen Reactions  . Ciprofloxacin Nausea And Vomiting    syncope  . Codeine Other (See Comments)    HALLUCINATIONS  . Diltiazem Hcl Other (See Comments)    : low heart rate  . Hydrocodone Other (See Comments)    Makes her pass out  . Lovastatin Other (See Comments)    Pt doesn't remember a reaction  . Metformin Other (See Comments)    diarrhea  . Penicillins Hives  . Shellfish Allergy Hives  . Sulfonamide Derivatives Nausea And Vomiting     Medication List    STOP taking these medications       hydrALAZINE 25 MG tablet  Commonly known as:   APRESOLINE      TAKE these medications       acetaminophen 500 MG tablet  Commonly known as:  TYLENOL  Take 500 mg by mouth 2 (two) times daily as needed for pain.     allopurinol 100 MG tablet  Commonly known as:  ZYLOPRIM  Take 100 mg by mouth daily.     amLODipine 5 MG tablet  Commonly known as:  NORVASC  Take 5 mg by mouth daily.     aspirin 81 MG chewable tablet  Chew 81 mg by mouth every evening.     atorvastatin 20 MG tablet  Commonly known as:  LIPITOR  Take one tablet by mouth daily--Reveal study Drug     chlorthalidone 25 MG tablet  Commonly known as:  HYGROTON  Take 1 tablet (25 mg total) by mouth daily.     clopidogrel 75 MG tablet  Commonly known as:  PLAVIX  Take 1 tablet (75 mg total) by mouth daily.     colchicine 0.6 MG tablet  Take 0.6 mg by mouth as needed.     diclofenac 75 MG EC tablet  Commonly known as:  VOLTAREN  Take 75 mg by mouth 2 (two) times daily.     furosemide 40 MG tablet  Commonly known as:  LASIX  Take 1 tablet (40 mg total) by mouth daily.     insulin NPH Human 100 UNIT/ML injection  Commonly known as:  HUMULIN N,NOVOLIN N  Inject 10-40 Units into the skin 2 (two) times daily before a meal. 10 units at night, and 40 units in the morning.     iron polysaccharides 150 MG capsule  Commonly known as:  NIFEREX  Take 150 mg by mouth daily.     levothyroxine 100 MCG tablet  Commonly known as:  SYNTHROID, LEVOTHROID  Take 1 tablet (100 mcg total) by mouth daily.     losartan 100 MG tablet  Commonly known as:  COZAAR  Take 1 tablet (100 mg total) by mouth daily.     nitroGLYCERIN 0.4 MG SL tablet  Commonly known as:  NITROSTAT  Place 1 tablet (0.4 mg total) under the tongue every 5 (five) minutes as needed for chest pain.     NONFORMULARY OR COMPOUNDED ITEM  Anacetrapib  or matching placebo daily (Reveal study drug)     ONE TOUCH ULTRA TEST test strip  Generic drug:  glucose blood  1 each by Other route daily as  needed (blood sugar).     pioglitazone 15 MG tablet  Commonly known as:  ACTOS  Take 15 mg by mouth daily.        Discharge Instructions   Diet - low sodium heart healthy    Complete by:  As directed      Diet Carb Modified    Complete by:  As directed      Increase activity slowly    Complete by:  As directed           Follow-up Information   Follow up with Tereso Newcomer, PA-C On 12/11/2013. (at 10:15 am)    Specialty:  Physician Assistant   Contact information:   [address] [place] [address] [phone]       BRING ALL MEDICATIONS WITH YOU TO FOLLOW UP APPOINTMENTS  Time spent with patient to include physician time: 39 min Signed: Theodore Demark, PA-C 12/03/2013, 10:56 AM Co-Sign MD

## 2013-12-03 NOTE — Progress Notes (Signed)
Patient Name: [name] Date of Encounter: 12/03/2013  Principal Problem:   Unstable angina Active Problems:   HYPERLIPIDEMIA   CORONARY ARTERY DISEASE, hx of PCI to LCX, RCA, LAD then CABG 2011   CKD (chronic kidney disease) stage 2, GFR 60-89 ml/min   DM (diabetes mellitus)    Patient Profile: 81 yo female w/ hx CABG 2011, CKD, HTN, DM, HLD, PPM for syncope. Admitted 08/21 with chest pain. Ez neg MI, cath yest w/ med rx for distal CAD. Grafts patent, EF 60%  SUBJECTIVE: Pt chest pain has resolved. No SOB. Ready for d/c   OBJECTIVE Filed Vitals:   12/02/13 1300 12/02/13 2104 12/02/13 2117 12/03/13 0500  BP: 163/43 141/43 146/54 155/48  Pulse: 66 63  60  Temp: 98.5 F (36.9 C) 98.5 F (36.9 C)  98.7 F (37.1 C)  TempSrc: Oral Oral  Oral  Resp: Height:      Weight:    245 lb 2.4 oz (111.2 kg)  SpO2: 99% 100%  99%    Intake/Output Summary (Last 24 hours) at 12/03/13 0800 Last data filed at 12/02/13 1300  Gross per 24 hour  Intake    240 ml  Output    600 ml  Net   -360 ml   Filed Weights   12/01/13 0605 12/02/13 0500 12/03/13 0500  Weight: 242 lb 8.1 oz (110 kg) 244 lb 14.9 oz (111.1 kg) 245 lb 2.4 oz (111.2 kg)    PHYSICAL EXAM General: Well developed, well nourished, female in no acute distress. Head: Normocephalic, atraumatic.  Neck: Supple without bruits, JVD not elevated. Lungs:  Resp regular and unlabored, CTA. Heart: RRR, S1, S2, no S3, S4, 2/6 murmur; no rub. Abdomen: Soft, non-tender, non-distended, BS + x 4.  Extremities: No clubbing, cyanosis, no edema. Left Radial cath site without ecchymosis/hematoma Neuro: Alert and oriented X 3. Moves all extremities spontaneously. Psych: Normal affect.  LABS: CBC: Recent Labs  12/02/13 1050 12/03/13 0519  WBC 10.0 11.3*  HGB 10.9* 10.6*  HCT 34.4* 32.7*  MCV 95.8 94.2  PLT 157 177   Basic Metabolic Panel: Recent Labs  12/02/13 0452 12/02/13 1050  NA 139 137  K 4.2 4.7  CL  103 102  CO2 25 25  GLUCOSE 134* 176*  BUN 48* 40*  CREATININE 1.35* 1.25*  CALCIUM 8.9 8.8   TELE:  SR, no significant ectopy.      Radiology/Studies: No results found.  Current Medications:  . allopurinol  100 mg Oral Q2000  . amLODipine  5 mg Oral Q2000  . aspirin EC  81 mg Oral Q2000  . atorvastatin  20 mg Oral Q2000  . clopidogrel  75 mg Oral Q2000  . enoxaparin (LOVENOX) injection  40 mg Subcutaneous Q24H  . hydrALAZINE  25 mg Oral Q2000  . insulin aspart  0-5 Units Subcutaneous QHS  . insulin aspart  0-9 Units Subcutaneous TID WC  . insulin NPH Human  15 Units Subcutaneous BID AC & HS  . iron polysaccharides  150 mg Oral Q2000  . levothyroxine  100 mcg Oral QAC breakfast  . metoprolol tartrate  12.5 mg Oral BID  . pioglitazone  15 mg Oral Q2000      ASSESSMENT AND PLAN: Principal Problem:   Unstable angina - Pt had prolonged chest pain, responsive to NTG without ECG changes or ez elevations. Cath w/ all grafts patent, EF 60%. Med rx for distal CFX dz, on ASA,  Plavix, statin, BB.  Active Problems:   HTN - BP not well controlled. She is on low-dose BB, A-pacing most of the time, some AV pacing. Consider increasing BB &/or amlodipine.    HYPERLIPIDEMIA - continue statin    CORONARY ARTERY DISEASE, hx of PCI to LCX, RCA, LAD then CABG 2011 - grafts patent at cath    CKD (chronic kidney disease) stage 2, GFR 60-89 ml/min - OK so far after cath, follow.    DM (diabetes mellitus) - encourage DM diet compliance, continue rx    SSS - s/p MDT PPM - no recent interrogation, will get done.  Plan - d/c home  Signed, Theodore Demark , PA-C 8:00 AM 12/03/2013  Patient seen, examined. Available data reviewed. Agree with findings, assessment, and plan as outlined by Theodore Demark, PA-C. The patient was independently interviewed and examined. Her left radial site is clear. Heart is regular rate and rhythm without murmur. Lungs are clear. There is no peripheral edema. Agree  with plans as outlined above. The patient is ready for discharge home today. All of her bypass grafts are patent. Blood pressure readings are up and down here in the hospital. She does have chronic dizziness.  I have reviewed her home medicines and she is taking hydralazine once daily. I would recommend discontinuing this drug. Otherwise continue home antihypertensive therapy.  Tonny Bollman, M.D. 12/03/2013 9:32 AM '[

## 2013-12-11 ENCOUNTER — Encounter: Payer: Self-pay | Admitting: Physician Assistant

## 2013-12-11 ENCOUNTER — Ambulatory Visit (HOSPITAL_COMMUNITY): Payer: Medicare Other | Attending: Cardiovascular Disease | Admitting: Cardiology

## 2013-12-11 ENCOUNTER — Ambulatory Visit (INDEPENDENT_AMBULATORY_CARE_PROVIDER_SITE_OTHER): Payer: Medicare Other | Admitting: Physician Assistant

## 2013-12-11 VITALS — BP 132/70 | HR 62 | Ht 67.0 in | Wt 243.0 lb

## 2013-12-11 DIAGNOSIS — I10 Essential (primary) hypertension: Secondary | ICD-10-CM | POA: Insufficient documentation

## 2013-12-11 DIAGNOSIS — I2510 Atherosclerotic heart disease of native coronary artery without angina pectoris: Secondary | ICD-10-CM | POA: Insufficient documentation

## 2013-12-11 DIAGNOSIS — I739 Peripheral vascular disease, unspecified: Secondary | ICD-10-CM | POA: Insufficient documentation

## 2013-12-11 DIAGNOSIS — Z951 Presence of aortocoronary bypass graft: Secondary | ICD-10-CM | POA: Insufficient documentation

## 2013-12-11 DIAGNOSIS — I6529 Occlusion and stenosis of unspecified carotid artery: Secondary | ICD-10-CM

## 2013-12-11 DIAGNOSIS — E785 Hyperlipidemia, unspecified: Secondary | ICD-10-CM | POA: Diagnosis not present

## 2013-12-11 DIAGNOSIS — R42 Dizziness and giddiness: Secondary | ICD-10-CM | POA: Diagnosis not present

## 2013-12-11 DIAGNOSIS — R0989 Other specified symptoms and signs involving the circulatory and respiratory systems: Secondary | ICD-10-CM | POA: Insufficient documentation

## 2013-12-11 DIAGNOSIS — I5032 Chronic diastolic (congestive) heart failure: Secondary | ICD-10-CM

## 2013-12-11 DIAGNOSIS — Z950 Presence of cardiac pacemaker: Secondary | ICD-10-CM

## 2013-12-11 NOTE — Progress Notes (Signed)
Carotid duplex performed 

## 2013-12-11 NOTE — Patient Instructions (Signed)
Your physician recommends that you continue on your current medications as directed. Please refer to the Current Medication list given to you today.  Your physician wants you to follow-up in: 6 months with Dr. Cooper.  You will receive a reminder letter in the mail two months in advance. If you don't receive a letter, please call our office to schedule the follow-up appointment.  

## 2013-12-11 NOTE — Progress Notes (Signed)
Cardiology Office Note    Date:  12/11/2013   ID:  [name], DOB June 01, 1932, MRN [id number]  PCP:  Geraldo Pitter, MD  Cardiologist:  Dr. Tonny Bollman   Electrophysiologist:  Dr. Hillis Range    History of Present Illness: [name] is a 80 y.o. female with a hx of CAD s/p CABG 2011, SSS s/p PPM, HTN, diastolic CHF, DM2, CKD.  She was recently admitted 8/21-8/25 with chest pain.  CEs remained normal.  She underwent cardiac cath that demonstrated patent bypass grafts.  Med Rx was continued.  She returns for FU.  The patient denies chest pain, significant shortness of breath, syncope, PND or significant pedal edema.  She sleeps on 6 pillows chronically.     Studies:  - LHC (8/15):  S-OM1/OM2 patent, S-D1 patent, S-PDA patent, L-LAD patent, EF 60% >>> Med Rx  - Echo (02/2012):  EF 55-60%, Gr 2 DD, MAC, mild LAE, normal RVF, PASP 39 mmHg   Recent Labs/Images: 09/25/2013: Pro B Natriuretic peptide (BNP) 61.0  11/29/2013: ALT 20  11/30/2013: HDL Cholesterol by NMR 90; LDL (calc) 25  12/02/2013: Creatinine 1.25*; Potassium 4.7  12/03/2013: Hemoglobin 10.6*     Wt Readings from Last 3 Encounters:  12/11/13 243 lb (110.224 kg)  12/03/13 245 lb 2.4 oz (111.2 kg)  12/03/13 245 lb 2.4 oz (111.2 kg)     Past Medical History  Diagnosis Date  . CAD (coronary artery disease) 2009    Multivessel s/p PCI w/DES 2009 and CABG 2011  . HTN (hypertension)   . Type II or unspecified type diabetes mellitus without mention of complication, not stated as uncontrolled   . PVD (peripheral vascular disease)   . Diverticulosis of colon   . Morbid obesity   . Gout   . Depression   . Osteoarthritis   . Osteopenia   . Hyperlipidemia   . LBP (low back pain)     Lumbar disc disease/lumbar spinal stenosis  . Disc disease, degenerative, cervical   . History of thrombocytopenia   . Anxiety   . GERD (gastroesophageal reflux disease)   . Barrett esophagus   . Gastroparesis   .  Helicobacter pylori gastritis   . Allergic rhinitis   . Anemia   . Myocardial infarction   . Hypothyroidism   . H/O hiatal hernia   . Sick sinus syndrome     MDT Dual-chamber PPM implant 02/2012  . CKD (chronic kidney disease), stage II     GFR 60-89 ml/min    Current Outpatient Prescriptions  Medication Sig Dispense Refill  . acetaminophen (TYLENOL) 500 MG tablet Take 500 mg by mouth 2 (two) times daily as needed for pain.       Marland Kitchen allopurinol (ZYLOPRIM) 100 MG tablet Take 100 mg by mouth daily.      Marland Kitchen amLODipine (NORVASC) 5 MG tablet Take 5 mg by mouth daily.       Marland Kitchen aspirin 81 MG tablet Take 81 mg by mouth daily.      Marland Kitchen atorvastatin (LIPITOR) 20 MG tablet Take one tablet by mouth daily--Reveal study Drug  1 tablet  0  . chlorthalidone (HYGROTON) 25 MG tablet Take 1 tablet (25 mg total) by mouth daily.  30 tablet  0  . clopidogrel (PLAVIX) 75 MG tablet Take 1 tablet (75 mg total) by mouth daily.  30 tablet  2  . colchicine 0.6 MG tablet Take 0.6 mg by mouth as needed.      Marland Kitchen  diclofenac (VOLTAREN) 75 MG EC tablet Take 75 mg by mouth 2 (two) times daily.       . furosemide (LASIX) 40 MG tablet Take 1 tablet (40 mg total) by mouth daily.  90 tablet  3  . insulin NPH Human (HUMULIN N,NOVOLIN N) 100 UNIT/ML injection Inject 10-40 Units into the skin 2 (two) times daily before a meal. 10 units at night, and 40 units in the morning.      . iron polysaccharides (NIFEREX) 150 MG capsule Take 150 mg by mouth daily.        Marland Kitchen levothyroxine (SYNTHROID, LEVOTHROID) 100 MCG tablet Take 1 tablet (100 mcg total) by mouth daily.  30 tablet  0  . losartan (COZAAR) 100 MG tablet Take 1 tablet (100 mg total) by mouth daily.  90 tablet  3  . nitroGLYCERIN (NITROSTAT) 0.4 MG SL tablet Place 1 tablet (0.4 mg total) under the tongue every 5 (five) minutes as needed for chest pain.  25 tablet  3  . NONFORMULARY OR COMPOUNDED ITEM Anacetrapib  or matching placebo daily (Reveal study drug)      . ONE TOUCH  ULTRA TEST test strip 1 each by Other route daily as needed (blood sugar).       . pioglitazone (ACTOS) 15 MG tablet Take 15 mg by mouth daily.        No current facility-administered medications for this visit.     Allergies:   Ciprofloxacin; Codeine; Diltiazem hcl; Hydrocodone; Lovastatin; Metformin; Penicillins; Shellfish allergy; and Sulfonamide derivatives   Social History:  The patient  reports that she has quit smoking. She has never used smokeless tobacco. She reports that she does not drink alcohol or use illicit drugs.   Family History:  The patient's family history includes Coronary artery disease in her other; Diabetes in her mother; Heart attack in her mother; Hypertension in her mother; Stroke in her father.   ROS:  Please see the history of present illness.      All other systems reviewed and negative.   PHYSICAL EXAM: VS:  BP 132/70  Pulse 62  Ht  (1.702 m)  Wt 243 lb (110.224 kg)  BMI 38.05 kg/m2 Well nourished, well developed, in no acute distress HEENT: normal Neck: no JVD Cardiac:  normal S1, S2; RRR; no murmur Lungs:  clear to auscultation bilaterally, no wheezing, rhonchi or rales Abd: soft, nontender, no hepatomegaly Ext: no edemaL wrist without hematoma or mass Skin: warm and dry Neuro:  CNs 2-12 intact, no focal abnormalities noted  EKG:  AV paced HR 62     ASSESSMENT AND PLAN:  1. Coronary Artery Disease:  No angina. Recent cardiac catheterization with patent bypass grafts. Continue current medical regimen which includes aspirin, amlodipine, statin, Plavix.   2. Chronic diastolic heart failure: Volume stable. Continue current diuretic regimen. 3. HYPERTENSION: Fair control. Continue to monitor. 4. HYPERLIPIDEMIA: Continue statin. She is in the Reveal study. 5. S/P placement of cardiac pacemaker: Follow up with the EP as planned.   Disposition:  FU with Dr. Tonny Bollman in 6 mos.    Signed, Brynda Rim, MHS 12/11/2013 10:59 AM      [facility] [address] [place], [place], [place]  [address] Phone: [phone]; Fax: [phone]

## 2014-01-06 ENCOUNTER — Encounter: Payer: Medicare Other | Admitting: *Deleted

## 2014-01-06 ENCOUNTER — Telehealth: Payer: Self-pay | Admitting: Cardiology

## 2014-01-06 NOTE — Telephone Encounter (Signed)
Spoke with pt and reminded pt of remote transmission that is due today. Pt verbalized understanding.   

## 2014-01-10 ENCOUNTER — Encounter: Payer: Self-pay | Admitting: Cardiology

## 2014-01-16 ENCOUNTER — Other Ambulatory Visit: Payer: Self-pay | Admitting: Cardiovascular Disease

## 2014-01-20 ENCOUNTER — Ambulatory Visit (INDEPENDENT_AMBULATORY_CARE_PROVIDER_SITE_OTHER): Payer: Medicare Other | Admitting: *Deleted

## 2014-01-20 DIAGNOSIS — I495 Sick sinus syndrome: Secondary | ICD-10-CM

## 2014-01-20 LAB — MDC_IDC_ENUM_SESS_TYPE_REMOTE
Battery Impedance: 112 Ohm
Battery Voltage: 2.79 V
Brady Statistic AP VS Percent: 0 %
Brady Statistic AS VP Percent: 21 %
Brady Statistic AS VS Percent: 3 %
Date Time Interrogation Session: 20151012115033
Lead Channel Pacing Threshold Amplitude: 0.5 V
Lead Channel Pacing Threshold Amplitude: 0.75 V
Lead Channel Pacing Threshold Pulse Width: 0.4 ms
Lead Channel Pacing Threshold Pulse Width: 0.4 ms
Lead Channel Sensing Intrinsic Amplitude: 2.8 mV
MDC IDC MSMT BATTERY REMAINING LONGEVITY: 122 mo
MDC IDC MSMT LEADCHNL RA IMPEDANCE VALUE: 416 Ohm
MDC IDC MSMT LEADCHNL RV IMPEDANCE VALUE: 430 Ohm
MDC IDC SET LEADCHNL RA PACING AMPLITUDE: 2 V
MDC IDC SET LEADCHNL RV PACING AMPLITUDE: 2.5 V
MDC IDC SET LEADCHNL RV PACING PULSEWIDTH: 0.4 ms
MDC IDC SET LEADCHNL RV SENSING SENSITIVITY: 2 mV
MDC IDC STAT BRADY AP VP PERCENT: 76 %

## 2014-01-20 NOTE — Progress Notes (Signed)
Remote pacemaker transmission.   

## 2014-02-06 ENCOUNTER — Encounter: Payer: Self-pay | Admitting: Cardiology

## 2014-02-12 ENCOUNTER — Encounter: Payer: Self-pay | Admitting: Internal Medicine

## 2014-02-27 ENCOUNTER — Telehealth: Payer: Self-pay | Admitting: Cardiovascular Disease

## 2014-02-27 NOTE — Telephone Encounter (Signed)
I left a message on Nicole's confidential voicemail with EF and diagnosis.

## 2014-02-27 NOTE — Telephone Encounter (Signed)
New problem   Need to know if pt has CHF and need her last echocardiogram results. Please call

## 2014-03-20 ENCOUNTER — Encounter (HOSPITAL_COMMUNITY): Payer: Self-pay | Admitting: Internal Medicine

## 2014-04-16 ENCOUNTER — Encounter: Payer: Self-pay | Admitting: *Deleted

## 2014-04-24 ENCOUNTER — Other Ambulatory Visit: Payer: Self-pay | Admitting: *Deleted

## 2014-04-24 MED ORDER — CLOPIDOGREL BISULFATE 75 MG PO TABS: 75.0000 mg | ORAL_TABLET | Freq: Every day | ORAL | Status: DC

## 2014-05-12 DIAGNOSIS — I1310 Hypertensive heart and chronic kidney disease without heart failure, with stage 1 through stage 4 chronic kidney disease, or unspecified chronic kidney disease: Secondary | ICD-10-CM | POA: Diagnosis not present

## 2014-05-12 DIAGNOSIS — E0821 Diabetes mellitus due to underlying condition with diabetic nephropathy: Secondary | ICD-10-CM | POA: Diagnosis not present

## 2014-05-12 DIAGNOSIS — I10 Essential (primary) hypertension: Secondary | ICD-10-CM | POA: Diagnosis not present

## 2014-05-12 DIAGNOSIS — Z0000 Encounter for general adult medical examination without abnormal findings: Secondary | ICD-10-CM | POA: Diagnosis not present

## 2014-05-12 DIAGNOSIS — L89202 Pressure ulcer of unspecified hip, stage 2: Secondary | ICD-10-CM | POA: Diagnosis not present

## 2014-05-12 DIAGNOSIS — R269 Unspecified abnormalities of gait and mobility: Secondary | ICD-10-CM | POA: Diagnosis not present

## 2014-05-13 ENCOUNTER — Telehealth: Payer: Self-pay | Admitting: Cardiology

## 2014-05-13 NOTE — Telephone Encounter (Signed)
After showing remote transmission to device tech and finding out transmission was normal. Called pt and informed her that transmission was normal.

## 2014-05-19 ENCOUNTER — Encounter: Payer: Self-pay | Admitting: Podiatry

## 2014-05-19 ENCOUNTER — Ambulatory Visit (INDEPENDENT_AMBULATORY_CARE_PROVIDER_SITE_OTHER): Payer: Medicare Other | Admitting: Podiatry

## 2014-05-19 ENCOUNTER — Encounter: Payer: Self-pay | Admitting: Internal Medicine

## 2014-05-19 VITALS — BP 203/90 | HR 66 | Resp 14 | Ht 67.0 in | Wt 239.6 lb

## 2014-05-19 DIAGNOSIS — I739 Peripheral vascular disease, unspecified: Secondary | ICD-10-CM

## 2014-05-19 DIAGNOSIS — M79676 Pain in unspecified toe(s): Secondary | ICD-10-CM | POA: Diagnosis not present

## 2014-05-19 DIAGNOSIS — L84 Corns and callosities: Secondary | ICD-10-CM | POA: Diagnosis not present

## 2014-05-19 DIAGNOSIS — B351 Tinea unguium: Secondary | ICD-10-CM

## 2014-05-19 NOTE — Progress Notes (Signed)
   Subjective:    Patient ID: [name], female    DOB: 06-26-1932, [age] y.o.   MRN: [id number]  HPI Comments: N debridement L left medial 2nd toe, 10 toenails D and O long-term C hard painful corm left 2nd toe     10 toenails elongated thickened A diabetic pt, difficult to cut T none      Review of Systems  All other systems reviewed and are negative.      Objective:   Physical Exam  Orientated 3  Vascular: DP pulses 2/4 bilaterally PT pulses 1/4 bilaterally PT edema bilaterally  Neurological: Sensation to 10 g monofilament wire intact 5/5 bilaterally Vibratory sensation intact bilaterally Ankle reflex equal and reactive bilaterally  Dermatological: The toenails are elongated hypertrophic, incurvated, discolored and tender to palpation 6-10 Keratoses medial second left toe  Musculoskeletal: Hammertoe deformity second bilaterally      Assessment & Plan:   Assessment: Diminished posterior tibial pulses consistent with peripheral arterial disease Symptomatic onychomycoses 6-10 Keratoses second left  Plan: Debrided toenails 10 and keratoses 1 without a bleeding

## 2014-05-19 NOTE — Patient Instructions (Signed)
Diabetes and Foot Care Diabetes may cause you to have problems because of poor blood supply (circulation) to your feet and legs. This may cause the skin on your feet to become thinner, break easier, and heal more slowly. Your skin may become dry, and the skin may peel and crack. You may also have nerve damage in your legs and feet causing decreased feeling in them. You may not notice minor injuries to your feet that could lead to infections or more serious problems. Taking care of your feet is one of the most important things you can do for yourself.  HOME CARE INSTRUCTIONS  Wear shoes at all times, even in the house. Do not go barefoot. Bare feet are easily injured.  Check your feet daily for blisters, cuts, and redness. If you cannot see the bottom of your feet, use a mirror or ask someone for help.  Wash your feet with warm water (do not use hot water) and mild soap. Then pat your feet and the areas between your toes until they are completely dry. Do not soak your feet as this can dry your skin.  Apply a moisturizing lotion or petroleum jelly (that does not contain alcohol and is unscented) to the skin on your feet and to dry, brittle toenails. Do not apply lotion between your toes.  Trim your toenails straight across. Do not dig under them or around the cuticle. File the edges of your nails with an emery board or nail file.  Do not cut corns or calluses or try to remove them with medicine.  Wear clean socks or stockings every day. Make sure they are not too tight. Do not wear knee-high stockings since they may decrease blood flow to your legs.  Wear shoes that fit properly and have enough cushioning. To break in new shoes, wear them for just a few hours a day. This prevents you from injuring your feet. Always look in your shoes before you put them on to be sure there are no objects inside.  Do not cross your legs. This may decrease the blood flow to your feet.  If you find a minor scrape,  cut, or break in the skin on your feet, keep it and the skin around it clean and dry. These areas may be cleansed with mild soap and water. Do not cleanse the area with peroxide, alcohol, or iodine.  When you remove an adhesive bandage, be sure not to damage the skin around it.  If you have a wound, look at it several times a day to make sure it is healing.  Do not use heating pads or hot water bottles. They may burn your skin. If you have lost feeling in your feet or legs, you may not know it is happening until it is too late.  Make sure your health care provider performs a complete foot exam at least annually or more often if you have foot problems. Report any cuts, sores, or bruises to your health care provider immediately. SEEK MEDICAL CARE IF:   You have an injury that is not healing.  You have cuts or breaks in the skin.  You have an ingrown nail.  You notice redness on your legs or feet.  You feel burning or tingling in your legs or feet.  You have pain or cramps in your legs and feet.  Your legs or feet are numb.  Your feet always feel cold. SEEK IMMEDIATE MEDICAL CARE IF:   There is increasing redness,   swelling, or pain in or around a wound.  There is a red line that goes up your leg.  Pus is coming from a wound.  You develop a fever or as directed by your health care provider.  You notice a bad smell coming from an ulcer or wound. Document Released: 03/25/2000 Document Revised: 11/28/2012 Document Reviewed: 09/04/2012 ExitCare Patient Information 2015 [employer]. This information is not intended to replace advice given to you by your health care provider. Make sure you discuss any questions you have with your health care provider.  

## 2014-06-09 ENCOUNTER — Encounter: Payer: Self-pay | Admitting: Internal Medicine

## 2014-06-09 ENCOUNTER — Ambulatory Visit (INDEPENDENT_AMBULATORY_CARE_PROVIDER_SITE_OTHER): Payer: Medicare Other | Admitting: Internal Medicine

## 2014-06-09 VITALS — BP 152/70 | HR 80 | Ht 67.0 in | Wt 245.1 lb

## 2014-06-09 DIAGNOSIS — I2510 Atherosclerotic heart disease of native coronary artery without angina pectoris: Secondary | ICD-10-CM | POA: Diagnosis not present

## 2014-06-09 DIAGNOSIS — I495 Sick sinus syndrome: Secondary | ICD-10-CM | POA: Diagnosis not present

## 2014-06-09 DIAGNOSIS — R609 Edema, unspecified: Secondary | ICD-10-CM

## 2014-06-09 DIAGNOSIS — I5032 Chronic diastolic (congestive) heart failure: Secondary | ICD-10-CM | POA: Diagnosis not present

## 2014-06-09 DIAGNOSIS — I10 Essential (primary) hypertension: Secondary | ICD-10-CM

## 2014-06-09 LAB — MDC_IDC_ENUM_SESS_TYPE_INCLINIC
Battery Impedance: 136 Ohm
Battery Remaining Longevity: 118 mo
Battery Voltage: 2.79 V
Brady Statistic AP VP Percent: 75 %
Brady Statistic AP VS Percent: 0 %
Brady Statistic AS VP Percent: 21 %
Date Time Interrogation Session: 20160229170421
Lead Channel Impedance Value: 421 Ohm
Lead Channel Pacing Threshold Amplitude: 0.5 V
Lead Channel Pacing Threshold Amplitude: 0.75 V
Lead Channel Pacing Threshold Pulse Width: 0.4 ms
Lead Channel Sensing Intrinsic Amplitude: 2 mV
Lead Channel Sensing Intrinsic Amplitude: 5.6 mV
Lead Channel Setting Pacing Amplitude: 2.5 V
Lead Channel Setting Pacing Pulse Width: 0.4 ms
MDC IDC MSMT LEADCHNL RV IMPEDANCE VALUE: 455 Ohm
MDC IDC MSMT LEADCHNL RV PACING THRESHOLD PULSEWIDTH: 0.4 ms
MDC IDC SET LEADCHNL RA PACING AMPLITUDE: 2 V
MDC IDC SET LEADCHNL RV SENSING SENSITIVITY: 2 mV
MDC IDC STAT BRADY AS VS PERCENT: 4 %

## 2014-06-09 NOTE — Patient Instructions (Signed)
Your physician wants you to follow-up in: 12 months with Samantha BalsamAmber Seiler, NP. You will receive a reminder letter in the mail two months in advance. If you don't receive a letter, please call our office to schedule the follow-up appointment.   Remote monitoring is used to monitor your Pacemaker or ICD from home. This monitoring reduces the number of office visits required to check your device to one time per year. It allows us to keep an eye on the functioning of your device to ensure it is working properly. You are scheduled for a device check from home on 09/08/2014. You may send your transmission at any time that day. If you have a wireless device, the transmission will be sent automatically. After your physician reviews your transmission, you will receive a postcard with your next transmission date.   Your physician has recommended you make the following change in your medication:  Try to decrease the diclofenac to once a day if possible

## 2014-06-09 NOTE — Progress Notes (Signed)
Electrophysiology Office Note   Date:  06/09/2014   ID:  [name], DOB 10/13/1932, MRN [id number]  PCP:  Geraldo Pitter, MD  Cardiologist:  Dr Excell Seltzer Primary Electrophysiologist: Hillis Range, MD    Chief Complaint  Patient presents with  . Follow-up    SA node dysfunction     History of Present Illness: [name] is a 81 y.o. female who presents today for electrophysiology evaluation.   She is doing reasonably well at this time.  She remains active.   She has occasional dizziness. Her swelling is stable.  BP is elevated.  She takes diclofenac for leg pain.  Today, she denies symptoms of palpitations, chest pain, shortness of breath (above baseline), orthopnea, PND, claudication, dizziness, presyncope, syncope, bleeding, or neurologic sequela. The patient is tolerating medications without difficulties and is otherwise without complaint today.    Past Medical History  Diagnosis Date  . CAD (coronary artery disease) 2009    Multivessel s/p PCI w/DES 2009 and CABG 2011  . HTN (hypertension)   . Type II or unspecified type diabetes mellitus without mention of complication, not stated as uncontrolled   . PVD (peripheral vascular disease)   . Diverticulosis of colon   . Morbid obesity   . Gout   . Depression   . Osteoarthritis   . Osteopenia   . Hyperlipidemia   . LBP (low back pain)     Lumbar disc disease/lumbar spinal stenosis  . Disc disease, degenerative, cervical   . History of thrombocytopenia   . Anxiety   . GERD (gastroesophageal reflux disease)   . Barrett esophagus   . Gastroparesis   . Helicobacter pylori gastritis   . Allergic rhinitis   . Anemia   . Myocardial infarction   . Hypothyroidism   . H/O hiatal hernia   . Sick sinus syndrome     MDT Dual-chamber PPM implant 02/2012  . CKD (chronic kidney disease), stage II     GFR 60-89 ml/min   Past Surgical History  Procedure Laterality Date  . Coronary stent placement      Drug-eluting  stent to the left anterior descending, circumflex and right coronary artery in Jan 2009  . Cholecystectomy    . Abdominal hysterectomy    . Tubal ligation    . Ovarian cyst removal    . Coronary artery bypass graft  2011    LIMA-LAD, SVG-DIAG, SVG-OM1-OM2, SVG-PDA  . Eye surgery      BIL CATARACT REMOVAL 06/2010  . Shoulder arthroscopy  06/16/2011    Procedure: ARTHROSCOPY SHOULDER;  Surgeon: Kennieth Rad, MD;  Location: [facility] OR;  Service: Orthopedics;  Laterality: Left;  LEFT SHOULDER ARTHROSCOPY ACROMIALPLASTY, POSSIBLE MINI OPEN CUFF REPAIR   . Cardiac catheterization      2011  DR COOPER (APPT NEXT WEEK)  . Pacemaker insertion  03/06/12    MDT Adapta L implanted by Dr Johney Frame for SSS  . Permanent pacemaker insertion N/A 03/06/2012    Procedure: PERMANENT PACEMAKER INSERTION;  Surgeon: Hillis Range, MD;  Location[facility] C[facility] LAB;  Service: Cardiovascular;  Laterality: N/A;  . Left heart catheterization with coronary angiogram N/A 12/02/2013    Procedure: LEFT HEART CATHETERIZATION WITH CORONARY ANGIOGRAM;  Surgeon: Lesleigh Noe, MD;  Location: [facility] CATH LAB;  Service: Cardiovascular;  Laterality: N/A;     Current Outpatient Prescriptions  Medication Sig Dispense Refill  . acetaminophen (TYLENOL) 500 MG tablet Take 500 mg by mouth 2 (two) times daily as  needed for pain.     Marland Kitchen allopurinol (ZYLOPRIM) 100 MG tablet Take 100 mg by mouth daily.    Marland Kitchen amLODipine (NORVASC) 5 MG tablet Take 5 mg by mouth daily.     Marland Kitchen aspirin 81 MG tablet Take 81 mg by mouth daily.    Marland Kitchen atorvastatin (LIPITOR) 20 MG tablet Take one tablet by mouth daily--Reveal study Drug 1 tablet 0  . chlorthalidone (HYGROTON) 25 MG tablet Take 1 tablet (25 mg total) by mouth daily. 30 tablet 0  . clopidogrel (PLAVIX) 75 MG tablet Take 1 tablet (75 mg total) by mouth daily. 90 tablet 0  . colchicine 0.6 MG tablet Take 0.6 mg by mouth daily as needed (gout).     Marland Kitchen diclofenac (VOLTAREN) 75 MG EC tablet Take 75 mg by mouth 2 (two)  times daily.     . furosemide (LASIX) 40 MG tablet Take 1 tablet (40 mg total) by mouth daily. 90 tablet 3  . insulin NPH Human (HUMULIN N,NOVOLIN N) 100 UNIT/ML injection Inject 10-40 Units into the skin 2 (two) times daily before a meal. 10 units at night, and 40 units in the morning.    . iron polysaccharides (NIFEREX) 150 MG capsule Take 150 mg by mouth daily.      Marland Kitchen levothyroxine (SYNTHROID, LEVOTHROID) 100 MCG tablet Take 1 tablet (100 mcg total) by mouth daily. 30 tablet 0  . losartan (COZAAR) 100 MG tablet Take 1 tablet (100 mg total) by mouth daily. 90 tablet 3  . nitroGLYCERIN (NITROSTAT) 0.4 MG SL tablet Place 1 tablet (0.4 mg total) under the tongue every 5 (five) minutes as needed for chest pain. (Patient taking differently: Place 0.4 mg under the tongue every 5 (five) minutes as needed for chest pain (MAX 3 TABLETS). ) 25 tablet 3  . NONFORMULARY OR COMPOUNDED ITEM Anacetrapib  or matching placebo daily (Reveal study drug)    . ONE TOUCH ULTRA TEST test strip 1 each by Other route daily as needed (blood sugar).     . pioglitazone (ACTOS) 15 MG tablet Take 15 mg by mouth daily.      No current facility-administered medications for this visit.    Allergies:   Ciprofloxacin; Codeine; Diltiazem hcl; Hydrocodone; Lovastatin; Metformin; Penicillins; Shellfish allergy; and Sulfonamide derivatives   Social History:  The patient  reports that she has quit smoking. She has never used smokeless tobacco. She reports that she does not drink alcohol or use illicit drugs.   Family History:  The patient's family history includes Coronary artery disease in her other; Diabetes in her mother; Heart attack in her mother; Hypertension in her mother; Stroke in her father.    ROS:  Please see the history of present illness.   All other systems are reviewed and negative.    PHYSICAL EXAM: VS:  BP 152/70 mmHg  Pulse 80  Ht  (1.702 m)  Wt 245 lb 1.9 oz (111.186 kg)  BMI 38.38 kg/m2 , BMI  Body mass index is 38.38 kg/(m^2). WUJ:[id number] obese, in no acute distress HEENT: normal Neck: no JVD, carotid bruits, or masses Cardiac: RRR; no murmurs, rubs, or gallops,+2 edema  Respiratory:  clear to auscultation bilaterally, normal work of breathing GI: soft, nontender, nondistended, + BS MS: no deformity or atrophy Skin: warm and dry, device pocket is well healed Neuro:  Strength and sensation are intact Psych: euthymic mood, full affect   Device interrogation is reviewed today in detail.  See PaceArt for details.   Recent  Labs: 09/25/2013: Pro B Natriuretic peptide (BNP) 61.0 11/29/2013: ALT 20; Magnesium 2.2 12/02/2013: BUN 40*; Creatinine 1.25*; Potassium 4.7; Sodium 137 12/03/2013: Hemoglobin 10.6*; Platelets 177    Lipid Panel     Component Value Date/Time   CHOL 136 11/30/2013 0422   TRIG 107 11/30/2013 0422   TRIG 192* 04/20/2006 1246   HDL 90 11/30/2013 0422   CHOLHDL 1.5 11/30/2013 0422   CHOLHDL 4.1 CALC 04/20/2006 1246   VLDL 21 11/30/2013 0422   LDLCALC 25 11/30/2013 0422   LDLDIRECT 82.5 09/11/2009 0000   LDLDIRECT 125.8 04/20/2006 1246     Wt Readings from Last 3 Encounters:  06/09/14 245 lb 1.9 oz (111.186 kg)  05/19/14 239 lb 9.6 oz (108.682 kg)  12/11/13 243 lb (110.224 kg)      Other studies Reviewed: Additional studies/ records that were reviewed today include: Charlotte Sanes[facility] [employer]avers recent note    ASSESSMENT AND PLAN:   1. Sick sinus syndrome Normal pacemaker function See Arita Missace Art report She is V paced >80% with sensed AV of 200 msec.  I have turned MVP on today. Emma Programmer, applications(Device RN) with call in 2 days to see if her clinical status has changed with this change  2. HTN Stable No changes Avoid NSAIDS as above 2 gram sodium diet  3. Edema Avoid NSAIDs 2 gram sodium diet  4. CAD No ischemic symptoms  5. Obesity Weight loss is encouraged  [facility] Return to see Gypsy BalsamAmber Seiler NP in the EP clinic in 1 year Signed, Hillis RangeJames Kariah Loredo, MD    06/09/2014 5:01 PM     [facility] [address] [place] [address] [phone] (office) [phone] (fax)

## 2014-06-11 ENCOUNTER — Telehealth: Payer: Self-pay | Admitting: *Deleted

## 2014-06-11 NOTE — Telephone Encounter (Signed)
Called patient to assess s/s since programming changes made 06/09/14. Pt reports she hasn't noticed any improvement but did report an episode of SOB when waking up this morning, relieved in 5 minutes with rest. Patient instructed to call back if SOB episodes continue, pt states she will see Dr. Excell Seltzerooper Friday morning.

## 2014-06-13 ENCOUNTER — Ambulatory Visit (INDEPENDENT_AMBULATORY_CARE_PROVIDER_SITE_OTHER): Payer: Medicare Other | Admitting: Cardiovascular Disease

## 2014-06-13 ENCOUNTER — Encounter: Payer: Self-pay | Admitting: Cardiovascular Disease

## 2014-06-13 VITALS — BP 160/70 | HR 72 | Ht 67.0 in | Wt 247.2 lb

## 2014-06-13 DIAGNOSIS — I5033 Acute on chronic diastolic (congestive) heart failure: Secondary | ICD-10-CM | POA: Diagnosis not present

## 2014-06-13 MED ORDER — FUROSEMIDE 40 MG PO TABS: 40.0000 mg | ORAL_TABLET | Freq: Every day | ORAL | Status: DC

## 2014-06-13 NOTE — Progress Notes (Signed)
Cardiology Office Note   Date:  06/15/2014   ID:  [name], DOB August 04, 1932, MRN [id number]  PCP:  Geraldo Pitter, MD  Cardiologist:  Tonny Bollman, MD    Chief Complaint  Patient presents with  . Shortness of Breath    at times since pacer adjustment     History of Present Illness: [name] is a 81 y.o. female who presents for follow-up of coronary artery disease. The patient underwent multivessel CABG in 2011. She also has a history of sick sinus syndrome and underwent permanent pacemaker placement. Other comorbid conditions include diastolic heart failure, hypertension, type 2 diabetes, and chronic kidney disease. In 2015 she underwent cardiac catheterization after presenting with symptoms concerning for unstable angina pectoris. This demonstrated patency of all of her bypass grafts.  She had some pacemaker adjustment made last week and feels a little short of breath with activity since that time. Has mild leg swelling but no recent change. No orthopnea, PND, or chest pain. She's been trying to avoid salt. Her leg swelling is not as bad when she first wakes up in the morning. She has stopped taking furosemide because she feels like it aggravates her gout.  Past Medical History  Diagnosis Date  . CAD (coronary artery disease) 2009    Multivessel s/p PCI w/DES 2009 and CABG 2011  . HTN (hypertension)   . Type II or unspecified type diabetes mellitus without mention of complication, not stated as uncontrolled   . PVD (peripheral vascular disease)   . Diverticulosis of colon   . Morbid obesity   . Gout   . Depression   . Osteoarthritis   . Osteopenia   . Hyperlipidemia   . LBP (low back pain)     Lumbar disc disease/lumbar spinal stenosis  . Disc disease, degenerative, cervical   . History of thrombocytopenia   . Anxiety   . GERD (gastroesophageal reflux disease)   . Barrett esophagus   . Gastroparesis   . Helicobacter pylori gastritis   . Allergic rhinitis     . Anemia   . Myocardial infarction   . Hypothyroidism   . H/O hiatal hernia   . Sick sinus syndrome     MDT Dual-chamber PPM implant 02/2012  . CKD (chronic kidney disease), stage II     GFR 60-89 ml/min    Past Surgical History  Procedure Laterality Date  . Coronary stent placement      Drug-eluting stent to the left anterior descending, circumflex and right coronary artery in Jan 2009  . Cholecystectomy    . Abdominal hysterectomy    . Tubal ligation    . Ovarian cyst removal    . Coronary artery bypass graft  2011    LIMA-LAD, SVG-DIAG, SVG-OM1-OM2, SVG-PDA  . Eye surgery      BIL CATARACT REMOVAL 06/2010  . Shoulder arthroscopy  06/16/2011    Procedure: ARTHROSCOPY SHOULDER;  Surgeon: Kennieth Rad, MD;  Location: [facility] OR;  Service: Orthopedics;  Laterality: Left;  LEFT SHOULDER ARTHROSCOPY ACROMIALPLASTY, POSSIBLE MINI OPEN CUFF REPAIR   . Cardiac catheterization      2011  DR Dann Ventress (APPT NEXT WEEK)  . Pacemaker insertion  03/06/12    MDT Adapta L implanted by Dr Johney Frame for SSS  . Permanent pacemaker insertion N/A 03/06/2012    Procedure: PERMANENT PACEMAKER INSERTION;  Surgeon: Hillis Range, MD;  Location: [facility] CATH LAB;  Service: Cardiovascular;  Laterality: N/A;  . Left heart catheterization with coronary angiogram  N/A 12/02/2013    Procedure: LEFT HEART CATHETERIZATION WITH CORONARY ANGIOGRAM;  Surgeon: Lesleigh NoeHenry W Smith III, MD;  [facility];  Service: Cardiovascular;  Laterality: N/A;    Current Outpatient Prescriptions  Medication Sig Dispense Refill  . acetaminophen (TYLENOL) 500 MG tablet Take 500 mg by mouth 2 (two) times daily as needed for pain.     Marland Kitchen. allopurinol (ZYLOPRIM) 100 MG tablet Take 100 mg by mouth daily.    Marland Kitchen. amLODipine (NORVASC) 5 MG tablet Take 5 mg by mouth daily.     Marland Kitchen. aspirin 81 MG tablet Take 81 mg by mouth daily.    Marland Kitchen. atorvastatin (LIPITOR) 20 MG tablet Take one tablet by mouth daily--Reveal study Drug 1 tablet 0  . chlorthalidone  (HYGROTON) 25 MG tablet Take 1 tablet (25 mg total) by mouth daily. 30 tablet 0  . clopidogrel (PLAVIX) 75 MG tablet Take 1 tablet (75 mg total) by mouth daily. 90 tablet 0  . colchicine 0.6 MG tablet Take 0.6 mg by mouth daily as needed (gout).     Marland Kitchen. diclofenac (VOLTAREN) 75 MG EC tablet Take 75 mg by mouth 2 (two) times daily.     . furosemide (LASIX) 40 MG tablet Take 1 tablet (40 mg total) by mouth daily. 90 tablet 3  . insulin NPH Human (HUMULIN N,NOVOLIN N) 100 UNIT/ML injection Inject 10-40 Units into the skin 2 (two) times daily before a meal. 10 units at night, and 40 units in the morning.    . iron polysaccharides (NIFEREX) 150 MG capsule Take 150 mg by mouth daily.      Marland Kitchen. levothyroxine (SYNTHROID, LEVOTHROID) 100 MCG tablet Take 1 tablet (100 mcg total) by mouth daily. 30 tablet 0  . losartan (COZAAR) 100 MG tablet Take 1 tablet (100 mg total) by mouth daily. 90 tablet 3  . nitroGLYCERIN (NITROSTAT) 0.4 MG SL tablet Place 1 tablet (0.4 mg total) under the tongue every 5 (five) minutes as needed for chest pain. (Patient taking differently: Place 0.4 mg under the tongue every 5 (five) minutes as needed for chest pain (MAX 3 TABLETS). ) 25 tablet 3  . NONFORMULARY OR COMPOUNDED ITEM Anacetrapib 100mg  or matching placebo daily (Reveal study drug)    . ONE TOUCH ULTRA TEST test strip 1 each by Other route daily as needed (blood sugar).     . pioglitazone (ACTOS) 15 MG tablet Take 15 mg by mouth daily.      No current facility-administered medications for this visit.    Allergies:   Ciprofloxacin; Codeine; Hydrocodone; Penicillins; Shellfish allergy; Diltiazem hcl; Sulfonamide derivatives; Lovastatin; and Metformin   Social History:  The patient  reports that she has quit smoking. She has never used smokeless tobacco. She reports that she does not drink alcohol or use illicit drugs.   Family History:  The patient's  family history includes Coronary artery disease in her other; Diabetes in  her mother; Heart attack in her mother; Hypertension in her mother; Stroke in her father.    ROS:  Please see the history of present illness.  Otherwise, review of systems is positive for shortness of breath, leg swelling.  All other systems are reviewed and negative.    PHYSICAL EXAM: VS:  BP 160/70 mmHg  Pulse 72  Ht 5\' 7"  (1.702 m)  Wt 247 lb 3.2 oz (112.129 kg)  BMI 38.71 kg/m2  SpO2 83% , BMI Body mass index is 38.71 kg/(m^2). GEN: Well nourished, well developed, in no acute distress  HEENT: normal Neck:  no masses, no carotid bruits. Mild jugular venous distention Cardiac: RRR grade 2/6 systolic ejection murmur at the right upper sternal border              Respiratory:  clear to auscultation bilaterally, normal work of breathing GI: soft, nontender, nondistended, + BS MS: no deformity or atrophy Ext: 2+ pretibial edema Skin: warm and dry, no rash Neuro:  Strength and sensation are intact Psych: euthymic mood, full affect  EKG:  EKG is not ordered today.  Recent Labs: 09/25/2013: Pro B Natriuretic peptide (BNP) 61.0 11/29/2013: ALT 20; Magnesium 2.2 12/02/2013: BUN 40*; Creatinine 1.25*; Potassium 4.7; Sodium 137 12/03/2013: Hemoglobin 10.6*; Platelets 177   Lipid Panel     Component Value Date/Time   CHOL 136 11/30/2013 0422   TRIG 107 11/30/2013 0422   TRIG 192* 04/20/2006 1246   HDL 90 11/30/2013 0422   CHOLHDL 1.5 11/30/2013 0422   CHOLHDL 4.1 CALC 04/20/2006 1246   VLDL 21 11/30/2013 0422   LDLCALC 25 11/30/2013 0422   LDLDIRECT 82.5 09/11/2009 0000   LDLDIRECT 125.8 04/20/2006 1246      Wt Readings from Last 3 Encounters:  06/13/14 247 lb 3.2 oz (112.129 kg)  06/09/14 245 lb 1.9 oz (111.186 kg)  05/19/14 239 lb 9.6 oz (108.682 kg)     Cardiac Studies Reviewed: - LHC (8/15): S-OM1/OM2 patent, S-D1 patent, S-PDA patent, L-LAD patent, EF 60% >>> Med Rx - Echo (02/2012): EF 55-60%, Gr 2 DD, MAC, mild LAE, normal RVF, PASP 39 mmHg  ASSESSMENT AND  PLAN: 1.  Coronary artery disease, native vessel. Patency of her bypass grafts demonstrated a cardiac catheterization last year. She will continue on her current medical program and an absence of further anginal symptoms.  2. Chronic diastolic heart failure, [place] Heart Association functional class 3: Medical program reviewed. She does have some evidence of volume overload on exam. I would like her to start furosemide 40 mg daily. Will schedule a follow-up metabolic panel in 2 weeks. We'll schedule a PA/NP visit in 3 months and I would like to see her back in 6 months.  3. Essential hypertension: Blood pressure above goal. Furosemide to be added. She will let us know if her gout worsens.  4. Hyperlipidemia: Remains on a statin drug. She is followed in the Reveal Study.  5. Shortness of breath. May be related to her chronic diastolic heart failure and obesity. However, she does report some worsening symptoms since her pacemaker was adjusted. This was just done a few days ago so I think we should wait a couple more weeks before any further adjustments are made. She will let us know and I will put her back in touch with the device clinic if necessary.   Current medicines are reviewed with the patient today.  The patient does not have concerns regarding medicines.  The following changes have been made:  See above  Labs/ tests ordered today include:   Orders Placed This Encounter  Procedures  . Basic Metabolic Panel (BMET)    Disposition:   FU 3 months with PA/NP, 6 months with me.  Signed, Tonny Bollman, MD  06/15/2014 11:50 AM    [facility] [address] [place], [place], [place]  [address] Phone: [phone]; Fax: [phone]

## 2014-06-13 NOTE — Patient Instructions (Addendum)
Your physician has recommended you make the following change in your medication:  1) RESTART: Take one 40 mg tablet of Lasix daily      Your physician recommends that you return for lab work in: 2 Weeks (BMET)  Your physician recommends that you schedule a follow-up appointment in: 3 months with NP or PA  Your physician wants you to follow-up in: 6 months with Dr. Excell Seltzerooper. You will receive a reminder letter in the mail two months in advance. If you don't receive a letter, please call our office to schedule the follow-up appointment.

## 2014-06-15 ENCOUNTER — Encounter: Payer: Self-pay | Admitting: Cardiovascular Disease

## 2014-06-23 ENCOUNTER — Other Ambulatory Visit (INDEPENDENT_AMBULATORY_CARE_PROVIDER_SITE_OTHER): Payer: Medicare Other | Admitting: *Deleted

## 2014-06-23 DIAGNOSIS — I5033 Acute on chronic diastolic (congestive) heart failure: Secondary | ICD-10-CM | POA: Diagnosis not present

## 2014-06-23 LAB — BASIC METABOLIC PANEL
BUN: 53 mg/dL — ABNORMAL HIGH (ref 6–23)
CO2: 33 mEq/L — ABNORMAL HIGH (ref 19–32)
CREATININE: 1.8 mg/dL — AB (ref 0.40–1.20)
Calcium: 9 mg/dL (ref 8.4–10.5)
Chloride: 102 mEq/L (ref 96–112)
GFR: 34.85 mL/min — ABNORMAL LOW (ref >60.00)
Glucose, Bld: 89 mg/dL (ref 70–99)
Potassium: 3.5 mEq/L (ref 3.5–5.1)
Sodium: 140 mEq/L (ref 135–145)

## 2014-06-25 ENCOUNTER — Other Ambulatory Visit: Payer: Self-pay

## 2014-06-25 DIAGNOSIS — I5033 Acute on chronic diastolic (congestive) heart failure: Secondary | ICD-10-CM

## 2014-07-23 ENCOUNTER — Other Ambulatory Visit: Payer: Medicare Other

## 2014-07-30 ENCOUNTER — Other Ambulatory Visit (INDEPENDENT_AMBULATORY_CARE_PROVIDER_SITE_OTHER): Payer: Medicare Other | Admitting: *Deleted

## 2014-07-30 DIAGNOSIS — I5033 Acute on chronic diastolic (congestive) heart failure: Secondary | ICD-10-CM | POA: Diagnosis not present

## 2014-07-30 LAB — BASIC METABOLIC PANEL
BUN: 34 mg/dL — ABNORMAL HIGH (ref 6–23)
CO2: 26 mEq/L (ref 19–32)
Calcium: 9.4 mg/dL (ref 8.4–10.5)
Chloride: 106 mEq/L (ref 96–112)
Creatinine, Ser: 1.15 mg/dL (ref 0.40–1.20)
GFR: 58.43 mL/min — AB (ref >60.00)
Glucose, Bld: 171 mg/dL — ABNORMAL HIGH (ref 70–99)
Potassium: 3.9 mEq/L (ref 3.5–5.1)
SODIUM: 138 meq/L (ref 135–145)

## 2014-08-03 ENCOUNTER — Encounter (HOSPITAL_COMMUNITY): Payer: Self-pay | Admitting: Family Medicine

## 2014-08-03 ENCOUNTER — Emergency Department (HOSPITAL_COMMUNITY): Payer: Medicare Other

## 2014-08-03 ENCOUNTER — Emergency Department (HOSPITAL_COMMUNITY)
Admission: EM | Admit: 2014-08-03 | Discharge: 2014-08-03 | Disposition: A | Discharge status: Home / Self Care | Payer: Medicare Other | Attending: Emergency Medicine | Admitting: Emergency Medicine

## 2014-08-03 DIAGNOSIS — Z9861 Coronary angioplasty status: Secondary | ICD-10-CM | POA: Insufficient documentation

## 2014-08-03 DIAGNOSIS — E785 Hyperlipidemia, unspecified: Secondary | ICD-10-CM | POA: Diagnosis not present

## 2014-08-03 DIAGNOSIS — Z8719 Personal history of other diseases of the digestive system: Secondary | ICD-10-CM | POA: Insufficient documentation

## 2014-08-03 DIAGNOSIS — I2510 Atherosclerotic heart disease of native coronary artery without angina pectoris: Secondary | ICD-10-CM | POA: Diagnosis not present

## 2014-08-03 DIAGNOSIS — H6121 Impacted cerumen, right ear: Secondary | ICD-10-CM | POA: Insufficient documentation

## 2014-08-03 DIAGNOSIS — Z9889 Other specified postprocedural states: Secondary | ICD-10-CM | POA: Insufficient documentation

## 2014-08-03 DIAGNOSIS — Z87891 Personal history of nicotine dependence: Secondary | ICD-10-CM | POA: Diagnosis not present

## 2014-08-03 DIAGNOSIS — Z79899 Other long term (current) drug therapy: Secondary | ICD-10-CM | POA: Insufficient documentation

## 2014-08-03 DIAGNOSIS — Z9842 Cataract extraction status, left eye: Secondary | ICD-10-CM | POA: Insufficient documentation

## 2014-08-03 DIAGNOSIS — Z9841 Cataract extraction status, right eye: Secondary | ICD-10-CM | POA: Diagnosis not present

## 2014-08-03 DIAGNOSIS — Z8619 Personal history of other infectious and parasitic diseases: Secondary | ICD-10-CM | POA: Insufficient documentation

## 2014-08-03 DIAGNOSIS — R51 Headache: Secondary | ICD-10-CM | POA: Diagnosis not present

## 2014-08-03 DIAGNOSIS — Z791 Long term (current) use of non-steroidal anti-inflammatories (NSAID): Secondary | ICD-10-CM | POA: Insufficient documentation

## 2014-08-03 DIAGNOSIS — I129 Hypertensive chronic kidney disease with stage 1 through stage 4 chronic kidney disease, or unspecified chronic kidney disease: Secondary | ICD-10-CM | POA: Insufficient documentation

## 2014-08-03 DIAGNOSIS — Z880 Allergy status to penicillin: Secondary | ICD-10-CM | POA: Diagnosis not present

## 2014-08-03 DIAGNOSIS — I10 Essential (primary) hypertension: Secondary | ICD-10-CM | POA: Diagnosis not present

## 2014-08-03 DIAGNOSIS — E039 Hypothyroidism, unspecified: Secondary | ICD-10-CM | POA: Diagnosis not present

## 2014-08-03 DIAGNOSIS — Z7982 Long term (current) use of aspirin: Secondary | ICD-10-CM | POA: Insufficient documentation

## 2014-08-03 DIAGNOSIS — Z951 Presence of aortocoronary bypass graft: Secondary | ICD-10-CM | POA: Insufficient documentation

## 2014-08-03 DIAGNOSIS — M109 Gout, unspecified: Secondary | ICD-10-CM | POA: Insufficient documentation

## 2014-08-03 DIAGNOSIS — Z7902 Long term (current) use of antithrombotics/antiplatelets: Secondary | ICD-10-CM | POA: Diagnosis not present

## 2014-08-03 DIAGNOSIS — M1990 Unspecified osteoarthritis, unspecified site: Secondary | ICD-10-CM | POA: Insufficient documentation

## 2014-08-03 DIAGNOSIS — D649 Anemia, unspecified: Secondary | ICD-10-CM | POA: Insufficient documentation

## 2014-08-03 DIAGNOSIS — I252 Old myocardial infarction: Secondary | ICD-10-CM | POA: Diagnosis not present

## 2014-08-03 DIAGNOSIS — Z8659 Personal history of other mental and behavioral disorders: Secondary | ICD-10-CM | POA: Diagnosis not present

## 2014-08-03 DIAGNOSIS — Z950 Presence of cardiac pacemaker: Secondary | ICD-10-CM | POA: Insufficient documentation

## 2014-08-03 DIAGNOSIS — N182 Chronic kidney disease, stage 2 (mild): Secondary | ICD-10-CM | POA: Insufficient documentation

## 2014-08-03 DIAGNOSIS — R011 Cardiac murmur, unspecified: Secondary | ICD-10-CM | POA: Insufficient documentation

## 2014-08-03 DIAGNOSIS — R519 Headache, unspecified: Secondary | ICD-10-CM

## 2014-08-03 LAB — URINALYSIS, ROUTINE W REFLEX MICROSCOPIC
BILIRUBIN URINE: NEGATIVE
Glucose, UA: NEGATIVE mg/dL
Hgb urine dipstick: NEGATIVE
Ketones, ur: NEGATIVE mg/dL
Nitrite: NEGATIVE
PH: 5.5 (ref 5.0–8.0)
Protein, ur: NEGATIVE mg/dL
Specific Gravity, Urine: 1.021 (ref 1.005–1.030)
Urobilinogen, UA: 0.2 mg/dL (ref 0.0–1.0)

## 2014-08-03 LAB — CBC WITH DIFFERENTIAL/PLATELET
BASOS ABS: 0 10*3/uL (ref 0.0–0.1)
Basophils Relative: 0 % (ref 0–1)
EOS ABS: 0.3 10*3/uL (ref 0.0–0.7)
EOS PCT: 4 % (ref 0–5)
HCT: 37.4 % (ref 36.0–46.0)
Hemoglobin: 12.1 g/dL (ref 12.0–15.0)
Lymphocytes Relative: 20 % (ref 12–46)
Lymphs Abs: 1.6 10*3/uL (ref 0.7–4.0)
MCH: 30.2 pg (ref 26.0–34.0)
MCHC: 32.4 g/dL (ref 30.0–36.0)
MCV: 93.3 fL (ref 78.0–100.0)
Monocytes Absolute: 0.5 10*3/uL (ref 0.1–1.0)
Monocytes Relative: 6 % (ref 3–12)
NEUTROS ABS: 5.4 10*3/uL (ref 1.7–7.7)
Neutrophils Relative %: 70 % (ref 43–77)
PLATELETS: 186 10*3/uL (ref 150–400)
RBC: 4.01 MIL/uL (ref 3.87–5.11)
RDW: 13.8 % (ref 11.5–15.5)
WBC: 7.7 10*3/uL (ref 4.0–10.5)

## 2014-08-03 LAB — BASIC METABOLIC PANEL
ANION GAP: 9 (ref 5–15)
BUN: 28 mg/dL — ABNORMAL HIGH (ref 6–23)
CALCIUM: 9.2 mg/dL (ref 8.4–10.5)
CHLORIDE: 103 mmol/L (ref 96–112)
CO2: 25 mmol/L (ref 19–32)
Creatinine, Ser: 1.05 mg/dL (ref 0.50–1.10)
GFR calc Af Amer: 57 mL/min — ABNORMAL LOW (ref >90)
GFR, EST NON AFRICAN AMERICAN: 49 mL/min — AB (ref >90)
Glucose, Bld: 72 mg/dL (ref 70–99)
POTASSIUM: 3.9 mmol/L (ref 3.5–5.1)
Sodium: 137 mmol/L (ref 135–145)

## 2014-08-03 LAB — URINE MICROSCOPIC-ADD ON

## 2014-08-03 LAB — PROTIME-INR
INR: 1.03 (ref 0.00–1.49)
Prothrombin Time: 13.6 seconds (ref 11.6–15.2)

## 2014-08-03 LAB — CBG MONITORING, ED: Glucose-Capillary: 89 mg/dL (ref 70–99)

## 2014-08-03 LAB — TROPONIN I

## 2014-08-03 MED ORDER — AMLODIPINE BESYLATE 5 MG PO TABS
10.0000 mg | ORAL_TABLET | Freq: Once | ORAL | Status: AC
  Administered 2014-08-03: 10 mg via ORAL
  Filled 2014-08-03: qty 2

## 2014-08-03 MED ORDER — ACETAMINOPHEN 500 MG PO TABS
1000.0000 mg | ORAL_TABLET | Freq: Once | ORAL | Status: AC
  Administered 2014-08-03: 1000 mg via ORAL
  Filled 2014-08-03: qty 2

## 2014-08-03 MED ORDER — LOSARTAN POTASSIUM 50 MG PO TABS
100.0000 mg | ORAL_TABLET | Freq: Once | ORAL | Status: AC
  Administered 2014-08-03: 100 mg via ORAL
  Filled 2014-08-03: qty 2

## 2014-08-03 NOTE — Discharge Instructions (Signed)
Hypertension  Continue your current blood pressure medications as prescribed ( Norvasc 5 mg a day and Cozaar 100 mg a day). These blood pressure medications were listed as to be taken in the chart from your cardiologist visit at the beginning of March. Follow-up with your family doctor this week to confirm that you have all of your appropriate prescriptions and medications and that you understand how to take them.   Hypertension, commonly called high blood pressure, is when the force of blood pumping through your arteries is too strong. Your arteries are the blood vessels that carry blood from your heart throughout your body. A blood pressure reading consists of a higher number over a lower number, such as 110/72. The higher number (systolic) is the pressure inside your arteries when your heart pumps. The lower number (diastolic) is the pressure inside your arteries when your heart relaxes. Ideally you want your blood pressure below 120/80. Hypertension forces your heart to work harder to pump blood. Your arteries may become narrow or stiff. Having hypertension puts you at risk for heart disease, stroke, and other problems.  RISK FACTORS Some risk factors for high blood pressure are controllable. Others are not.  Risk factors you cannot control include:   Race. You may be at higher risk if you are African American.  Age. Risk increases with age.  Gender. Men are at higher risk than women before age 79 years. After age 865, women are at higher risk than men. Risk factors you can control include:  Not getting enough exercise or physical activity.  Being overweight.  Getting too much fat, sugar, calories, or salt in your diet.  Drinking too much alcohol. SIGNS AND SYMPTOMS Hypertension does not usually cause signs or symptoms. Extremely high blood pressure (hypertensive crisis) may cause headache, anxiety, shortness of breath, and nosebleed. DIAGNOSIS  To check if you have hypertension, your  health care provider will measure your blood pressure while you are seated, with your arm held at the level of your heart. It should be measured at least twice using the same arm. Certain conditions can cause a difference in blood pressure between your right and left arms. A blood pressure reading that is higher than normal on one occasion does not mean that you need treatment. If one blood pressure reading is high, ask your health care provider about having it checked again. TREATMENT  Treating high blood pressure includes making lifestyle changes and possibly taking medicine. Living a healthy lifestyle can help lower high blood pressure. You may need to change some of your habits. Lifestyle changes may include:  Following the DASH diet. This diet is high in fruits, vegetables, and whole grains. It is low in salt, red meat, and added sugars.  Getting at least 2 hours of brisk physical activity every week.  Losing weight if necessary.  Not smoking.  Limiting alcoholic beverages.  Learning ways to reduce stress. If lifestyle changes are not enough to get your blood pressure under control, your health care provider may prescribe medicine. You may need to take more than one. Work closely with your health care provider to understand the risks and benefits. HOME CARE INSTRUCTIONS  Have your blood pressure rechecked as directed by your health care provider.   Take medicines only as directed by your health care provider. Follow the directions carefully. Blood pressure medicines must be taken as prescribed. The medicine does not work as well when you skip doses. Skipping doses also puts you at risk for  problems.   Do not smoke.   Monitor your blood pressure at home as directed by your health care provider. SEEK MEDICAL CARE IF:   You think you are having a reaction to medicines taken.  You have recurrent headaches or feel dizzy.  You have swelling in your ankles.  You have trouble  with your vision. SEEK IMMEDIATE MEDICAL CARE IF:  You develop a severe headache or confusion.  You have unusual weakness, numbness, or feel faint.  You have severe chest or abdominal pain.  You vomit repeatedly.  You have trouble breathing. MAKE SURE YOU:   Understand these instructions.  Will watch your condition.  Will get help right away if you are not doing well or get worse. Document Released: 03/28/2005 Document Revised: 08/12/2013 Document Reviewed: 01/18/2013 [facility] Patient Information 2015 [place], [place]. This information is not intended to replace advice given to you by your health care provider. Make sure you discuss any questions you have with your health care provider. General Headache Without Cause A headache is pain or discomfort felt around the head or neck area. The specific cause of a headache may not be found. There are many causes and types of headaches. A few common ones are:  Tension headaches.  Migraine headaches.  Cluster headaches.  Chronic daily headaches. HOME CARE INSTRUCTIONS   Keep all follow-up appointments with your caregiver or any specialist referral.  Only take over-the-counter or prescription medicines for pain or discomfort as directed by your caregiver.  Lie down in a dark, quiet room when you have a headache.  Keep a headache journal to find out what may trigger your migraine headaches. For example, write down:  What you eat and drink.  How much sleep you get.  Any change to your diet or medicines.  Try massage or other relaxation techniques.  Put ice packs or heat on the head and neck. Use these 3 to 4 times per day for 15 to 20 minutes each time, or as needed.  Limit stress.  Sit up straight, and do not tense your muscles.  Quit smoking if you smoke.  Limit alcohol use.  Decrease the amount of caffeine you drink, or stop drinking caffeine.  Eat and sleep on a regular schedule.  Get 7 to 9 hours of sleep, or as  recommended by your caregiver.  Keep lights dim if bright lights bother you and make your headaches worse. SEEK MEDICAL CARE IF:   You have problems with the medicines you were prescribed.  Your medicines are not working.  You have a change from the usual headache.  You have nausea or vomiting. SEEK IMMEDIATE MEDICAL CARE IF:   Your headache becomes severe.  You have a fever.  You have a stiff neck.  You have loss of vision.  You have muscular weakness or loss of muscle control.  You start losing your balance or have trouble walking.  You feel faint or pass out.  You have severe symptoms that are different from your first symptoms. MAKE SURE YOU:   Understand these instructions.  Will watch your condition.  Will get help right away if you are not doing well or get worse. Document Released: 03/28/2005 Document Revised: 06/20/2011 Document Reviewed: 04/13/2011 [facility] Patient Information 2015 [place], [place]. This information is not intended to replace advice given to you by your health care provider. Make sure you discuss any questions you have with your health care provider.

## 2014-08-03 NOTE — ED Notes (Signed)
Pt here for intermittent HA and hypertension since last night. sts her BP has been 220 sys after taking medication

## 2014-08-03 NOTE — ED Provider Notes (Signed)
CSN: [id number]     Arrival date & time 08/03/14  0754 History   First MD Initiated Contact with Patient 08/03/14 0802     Chief Complaint  Patient presents with  . Headache  . Hypertension     (Consider location/radiation/quality/duration/timing/severity/associated sxs/prior Treatment) HPI The patient reports that she developed a right sided frontal headache yesterday evening. She states the severity of it has been waxing and waning. It has a sharp and throbbing quality to it. The patient denies any associated visual changes. She reports that she has been nauseated but had no vomiting. She denies any focal weakness numbness tingling or gait incoordination. She describes it as gradual in onset. The patient reports that her blood pressures were elevated yesterday evening despite having taken her medications regularly. She states that her systolic blood pressure had been as high as 220 mmHg yesterday. She did take 500 mg of Tylenol before going to bed and that seemed to help somewhat. This morning she reports her blood pressure was still elevated and she still had her right-sided headache. The patient did not take her morning blood pressure medications prior to coming into the emergency department. She denies any problems with frequent headaches. She denies sinus infections or nasal drainage or discharge. She denies any other associated chest pain, shortness of breath, abdominal pain, fevers, abnormal swelling of her lower extremities. Past Medical History  Diagnosis Date  . CAD (coronary artery disease) 2009    Multivessel s/p PCI w/DES 2009 and CABG 2011  . HTN (hypertension)   . Type II or unspecified type diabetes mellitus without mention of complication, not stated as uncontrolled   . PVD (peripheral vascular disease)   . Diverticulosis of colon   . Morbid obesity   . Gout   . Depression   . Osteoarthritis   . Osteopenia   . Hyperlipidemia   . LBP (low back pain)     Lumbar disc  disease/lumbar spinal stenosis  . Disc disease, degenerative, cervical   . History of thrombocytopenia   . Anxiety   . GERD (gastroesophageal reflux disease)   . Barrett esophagus   . Gastroparesis   . Helicobacter pylori gastritis   . Allergic rhinitis   . Anemia   . Myocardial infarction   . Hypothyroidism   . H/O hiatal hernia   . Sick sinus syndrome     MDT Dual-chamber PPM implant 02/2012  . CKD (chronic kidney disease), stage II     GFR 60-89 ml/min   Past Surgical History  Procedure Laterality Date  . Coronary stent placement      Drug-eluting stent to the left anterior descending, circumflex and right coronary artery in Jan 2009  . Cholecystectomy    . Abdominal hysterectomy    . Tubal ligation    . Ovarian cyst removal    . Coronary artery bypass graft  2011    LIMA-LAD, SVG-DIAG, SVG-OM1-OM2, SVG-PDA  . Eye surgery      BIL CATARACT REMOVAL 06/2010  . Shoulder arthroscopy  06/16/2011    Procedure: ARTHROSCOPY SHOULDER;  Surgeon: Kennieth Rad, MD;  Location: [facility] OR;  Service: Orthopedics;  Laterality: Left;  LEFT SHOULDER ARTHROSCOPY ACROMIALPLASTY, POSSIBLE MINI OPEN CUFF REPAIR   . Cardiac catheterization      2011  DR COOPER (APPT NEXT WEEK)  . Pacemaker insertion  03/06/12    MDT Adapta L implanted by Dr Johney Frame for SSS  . Permanent pacemaker insertion N/A 03/06/2012    Procedure: PERMANENT PACEMAKER  INSERTION;  Surgeon: Hillis RangeJames Allred, MD;  [facility];  Service: Cardiovascular;  Laterality: N/A;  . Left heart catheterization with coronary angiogram N/A 12/02/2013    Procedure: LEFT HEART CATHETERIZATION WITH CORONARY ANGIOGRAM;  Surgeon: Lesleigh NoeHenry W Smith III, MD;  [facility];  Service: Cardiovascular;  Laterality: N/A;   Family History  Problem Relation Age of Onset  . Diabetes Mother   . Hypertension Mother   . Stroke Father   . Coronary artery disease Other   . Heart attack Mother    History  Substance Use Topics  . Smoking status:  Former Games developermoker  . Smokeless tobacco: Never Used     Comment: quit 30 yrs ago  . Alcohol Use: No   OB History    No data available     Review of Systems  10 Systems reviewed and are negative for acute change except as noted in the HPI.   Allergies  Ciprofloxacin; Codeine; Hydrocodone; Penicillins; Shellfish allergy; Diltiazem hcl; Sulfonamide derivatives; Lovastatin; and Metformin  Home Medications   Prior to Admission medications   Medication Sig Start Date End Date Taking? Authorizing Provider  acetaminophen (TYLENOL) 500 MG tablet Take 500 mg by mouth 2 (two) times daily as needed for pain.    Yes Historical Provider, MD  allopurinol (ZYLOPRIM) 100 MG tablet Take 100 mg by mouth daily.   Yes Historical Provider, MD  aspirin 81 MG tablet Take 81 mg by mouth daily.   Yes Historical Provider, MD  atorvastatin (LIPITOR) 20 MG tablet Take one tablet by mouth daily--Reveal study Drug 03/19/13  Yes Tonny BollmanMichael Cooper, MD  clopidogrel (PLAVIX) 75 MG tablet Take 1 tablet (75 mg total) by mouth daily. 04/24/14  Yes Lewayne BuntingBrian S Crenshaw, MD  colchicine 0.6 MG tablet Take 0.6 mg by mouth daily as needed (gout).    Yes Historical Provider, MD  diclofenac (VOLTAREN) 75 MG EC tablet Take 75 mg by mouth 2 (two) times daily.  01/21/13  Yes Historical Provider, MD  furosemide (LASIX) 40 MG tablet Take 40 mg by mouth daily.   Yes Historical Provider, MD  iron polysaccharides (NIFEREX) 150 MG capsule Take 150 mg by mouth daily.     Yes Historical Provider, MD  nitroGLYCERIN (NITROSTAT) 0.4 MG SL tablet Place 1 tablet (0.4 mg total) under the tongue every 5 (five) minutes as needed for chest pain. Patient taking differently: Place 0.4 mg under the tongue every 5 (five) minutes as needed for chest pain (MAX 3 TABLETS).  10/30/12  Yes Tonny BollmanMichael Cooper, MD  NONFORMULARY OR COMPOUNDED ITEM Anacetrapib 100mg  or matching placebo daily (Reveal study drug) 03/19/13  Yes Tonny BollmanMichael Cooper, MD  ONE TOUCH ULTRA TEST test strip 1  each by Other route daily as needed (blood sugar).  07/10/13  Yes Historical Provider, MD  chlorthalidone (HYGROTON) 25 MG tablet Take 1 tablet (25 mg total) by mouth daily. Patient not taking: Reported on 08/03/2014 03/08/12   Penny Piarlando Vega, MD  levothyroxine (SYNTHROID, LEVOTHROID) 100 MCG tablet Take 1 tablet (100 mcg total) by mouth daily. Patient not taking: Reported on 08/03/2014 03/13/12   Romero BellingSean Ellison, MD  losartan (COZAAR) 100 MG tablet Take 1 tablet (100 mg total) by mouth daily. Patient not taking: Reported on 08/03/2014 08/01/12   Tonny BollmanMichael Cooper, MD   BP 121/48 mmHg  Pulse 62  Temp(Src) 98 F (36.7 C) (Oral)  Resp 16  SpO2 100% Physical Exam  Constitutional: She is oriented to person, place, and time. She appears well-developed and  well-nourished.  The patient is morbidly obese. She is nontoxic and alert. Her mental status is clear. She has no respiratory distress. Objectively she is calm and in no acute distress.  HENT:  Head: Normocephalic and atraumatic.  Right Ear: External ear normal.  Left Ear: External ear normal.  Nose: Nose normal.  Mouth/Throat: Oropharynx is clear and moist.  Bilateral TMs normal without erythema or bulging. There is a moderate amount of cerumen impaction on the right.  Eyes: EOM are normal. Pupils are equal, round, and reactive to light.  Patient has bilateral cataract repairs.  Neck: Neck supple.  Cardiovascular: Normal rate, regular rhythm and intact distal pulses.   Patient has intermittent ectopic beats. A 2/6 systolic ejection murmur.  Pulmonary/Chest: Effort normal and breath sounds normal.  Abdominal: Soft. Bowel sounds are normal. She exhibits no distension. There is no tenderness.  Musculoskeletal: Normal range of motion. She exhibits no edema or tenderness.  Neurological: She is alert and oriented to person, place, and time. She has normal strength. No cranial nerve deficit. She exhibits normal muscle tone. Coordination normal. GCS eye  subscore is 4. GCS verbal subscore is 5. GCS motor subscore is 6.  Patient has 5 out of 5 grip strength and push pulls in the upper extremities. She also has 5 out of 5 flexion and extension at the ankles. She is able to elevate both lower extremities off of the bed and hold for 5 seconds. She reports sensation intact to light touch 4 extremities. Her speech is clear with normal cognitive function.  Skin: Skin is warm, dry and intact.  Psychiatric: She has a normal mood and affect.    ED Course  Procedures (including critical care time) Labs Review Labs Reviewed  BASIC METABOLIC PANEL - Abnormal; Notable for the following:    BUN 28 (*)    GFR calc non Af Amer 49 (*)    GFR calc Af Amer 57 (*)    All other components within normal limits  URINALYSIS, ROUTINE W REFLEX MICROSCOPIC - Abnormal; Notable for the following:    Leukocytes, UA SMALL (*)    All other components within normal limits  URINE MICROSCOPIC-ADD ON - Abnormal; Notable for the following:    Squamous Epithelial / LPF MANY (*)    All other components within normal limits  TROPONIN I  CBC WITH DIFFERENTIAL/PLATELET  PROTIME-INR  CBG MONITORING, ED    Imaging Review Ct Head Wo Contrast  08/03/2014   CLINICAL DATA:  Headache and hypertension.  EXAM: CT HEAD WITHOUT CONTRAST  TECHNIQUE: Contiguous axial images were obtained from the base of the skull through the vertex without contrast.  COMPARISON:  07/04/2010  FINDINGS: Mild cerebral atrophy is unchanged. Stable low-density in the right frontal subcortical white matter. No evidence for acute hemorrhage, mass lesion, midline shift, hydrocephalus or large infarct. Visualized and paranasal sinuses are clear. No acute bone abnormality. There appears to be mild proptosis involving both globes. This finding is unchanged.  IMPRESSION: No acute intracranial abnormality.   Electronically Signed   By: Richarda Overlie M.D.   On: 08/03/2014 09:53     EKG Interpretation   Date/Time:   Sunday August 03 2014 08:38:55 EDT Ventricular Rate:  182 PR Interval:    QRS Duration: 51 QT Interval:  287 QTC Calculation: 499 R Axis:   -83 Text Interpretation:  dual paced. no change from old. Confirmed by  Donnald Garre, MD, Lebron Conners [phone]) on 08/03/2014 11:04:32 AM     Recheck: The patient's  headache resolved completely and her blood pressure normalized to 138/42 with administration of her normal blood pressure medications. MDM   Final diagnoses:  Essential hypertension  Acute nonintractable headache, unspecified headache type   Upon arrival the patient had not yet had her a.m. medications. She reported hypertension with headache. She was administered Cozaar 100 mg as per her usual dose and Norvasc 10 mg. The patient's blood pressures have become normotensive and her headache has resolved. Diagnostic workup was negative for any acute findings. Patient had no neurologic dysfunction. There were no signs of acute infectious etiology. At this time there is consideration for the patient having missed doses of medication or taking the medication incorrectly as she responded very well to administration of her home medications. Review of the EMR are shows a cardiology visit with plan to continue the noted medications. I have advised the patient she needs to follow-up with her family doctor to confirm that she has all of her appropriate prescriptions and is clear on how and when to take her medications.    Arby Barrette, MD 08/03/14 [phone]

## 2014-08-03 NOTE — ED Notes (Signed)
Patient transported to CT 

## 2014-08-03 NOTE — ED Notes (Signed)
Pt's medtronic device interrogated. Rep repots device is functioning properly. No events.

## 2014-08-03 NOTE — ED Notes (Signed)
Pt eating [place]key sandwich and cranberry juice.

## 2014-08-07 DIAGNOSIS — E119 Type 2 diabetes mellitus without complications: Secondary | ICD-10-CM | POA: Diagnosis not present

## 2014-08-07 DIAGNOSIS — I1310 Hypertensive heart and chronic kidney disease without heart failure, with stage 1 through stage 4 chronic kidney disease, or unspecified chronic kidney disease: Secondary | ICD-10-CM | POA: Diagnosis not present

## 2014-08-07 DIAGNOSIS — I10 Essential (primary) hypertension: Secondary | ICD-10-CM | POA: Diagnosis not present

## 2014-08-07 DIAGNOSIS — H1012 Acute atopic conjunctivitis, left eye: Secondary | ICD-10-CM | POA: Diagnosis not present

## 2014-08-20 ENCOUNTER — Ambulatory Visit: Payer: Medicare Other | Admitting: Podiatry

## 2014-08-25 DIAGNOSIS — H35712 Central serous chorioretinopathy, left eye: Secondary | ICD-10-CM | POA: Diagnosis not present

## 2014-08-25 DIAGNOSIS — E119 Type 2 diabetes mellitus without complications: Secondary | ICD-10-CM | POA: Diagnosis not present

## 2014-08-25 DIAGNOSIS — H5202 Hypermetropia, left eye: Secondary | ICD-10-CM | POA: Diagnosis not present

## 2014-09-01 DIAGNOSIS — E119 Type 2 diabetes mellitus without complications: Secondary | ICD-10-CM | POA: Diagnosis not present

## 2014-09-01 DIAGNOSIS — I130 Hypertensive heart and chronic kidney disease with heart failure and stage 1 through stage 4 chronic kidney disease, or unspecified chronic kidney disease: Secondary | ICD-10-CM | POA: Diagnosis not present

## 2014-09-01 DIAGNOSIS — I110 Hypertensive heart disease with heart failure: Secondary | ICD-10-CM | POA: Diagnosis not present

## 2014-09-02 ENCOUNTER — Encounter (INDEPENDENT_AMBULATORY_CARE_PROVIDER_SITE_OTHER): Payer: Medicare Other | Admitting: Ophthalmology

## 2014-09-02 DIAGNOSIS — H43813 Vitreous degeneration, bilateral: Secondary | ICD-10-CM

## 2014-09-02 DIAGNOSIS — H3532 Exudative age-related macular degeneration: Secondary | ICD-10-CM | POA: Diagnosis not present

## 2014-09-02 DIAGNOSIS — E11319 Type 2 diabetes mellitus with unspecified diabetic retinopathy without macular edema: Secondary | ICD-10-CM

## 2014-09-02 DIAGNOSIS — E11329 Type 2 diabetes mellitus with mild nonproliferative diabetic retinopathy without macular edema: Secondary | ICD-10-CM

## 2014-09-02 DIAGNOSIS — H35033 Hypertensive retinopathy, bilateral: Secondary | ICD-10-CM | POA: Diagnosis not present

## 2014-09-02 DIAGNOSIS — I10 Essential (primary) hypertension: Secondary | ICD-10-CM

## 2014-09-02 DIAGNOSIS — H3531 Nonexudative age-related macular degeneration: Secondary | ICD-10-CM

## 2014-09-03 ENCOUNTER — Ambulatory Visit: Payer: Medicare Other | Admitting: Podiatry

## 2014-09-09 ENCOUNTER — Ambulatory Visit (INDEPENDENT_AMBULATORY_CARE_PROVIDER_SITE_OTHER): Payer: Medicare Other | Admitting: *Deleted

## 2014-09-09 DIAGNOSIS — Z1231 Encounter for screening mammogram for malignant neoplasm of breast: Secondary | ICD-10-CM | POA: Diagnosis not present

## 2014-09-09 DIAGNOSIS — I495 Sick sinus syndrome: Secondary | ICD-10-CM | POA: Diagnosis not present

## 2014-09-09 NOTE — Progress Notes (Signed)
Remote pacemaker transmission.   

## 2014-09-13 LAB — CUP PACEART REMOTE DEVICE CHECK
Battery Impedance: 160 Ohm
Battery Remaining Longevity: 117 mo
Battery Voltage: 2.79 V
Brady Statistic AP VP Percent: 54 %
Brady Statistic AP VS Percent: 30 %
Date Time Interrogation Session: 20160530175624
Lead Channel Impedance Value: 427 Ohm
Lead Channel Pacing Threshold Amplitude: 0.5 V
Lead Channel Pacing Threshold Amplitude: 0.875 V
Lead Channel Pacing Threshold Pulse Width: 0.4 ms
Lead Channel Setting Pacing Amplitude: 2 V
Lead Channel Setting Pacing Amplitude: 2.5 V
Lead Channel Setting Pacing Pulse Width: 0.4 ms
Lead Channel Setting Sensing Sensitivity: 2 mV
MDC IDC MSMT LEADCHNL RA PACING THRESHOLD PULSEWIDTH: 0.4 ms
MDC IDC MSMT LEADCHNL RV IMPEDANCE VALUE: 446 Ohm
MDC IDC MSMT LEADCHNL RV SENSING INTR AMPL: 5.6 mV — AB
MDC IDC STAT BRADY AS VP PERCENT: 7 %
MDC IDC STAT BRADY AS VS PERCENT: 9 %

## 2014-09-16 ENCOUNTER — Ambulatory Visit: Payer: Medicare Other | Admitting: Podiatry

## 2014-09-21 NOTE — Progress Notes (Signed)
Cardiology Office Note   Date:  09/22/2014   ID:  [name], DOB Feb 24, 1933, MRN [id number]  PCP:  Geraldo Pitter, MD  Cardiologist:  Dr. Tonny Bollman   Electrophysiologist:  Dr. Hillis Range   Chief Complaint  Patient presents with  . Coronary Artery Disease  . Congestive Heart Failure     History of Present Illness: [name] is a 81 y.o. female with a hx of CAD status post CABG in 2011, sick sinus syndrome status post pacemaker, diastolic HF, HTN, diabetes, CKD.  Cardiac catheterization in 11/2013 demonstrated patent bypass grafts. Last seen by Dr. Excell Seltzer 06/13/14. She complained of dyspnea and did have some volume excess on exam. She was placed on Lasix. She had also reported some increased symptoms since her pacemaker was adjusted in the office. Follow-up labs did indicate worsening creatinine. Her Lasix was stopped with improvement in renal function.   Studies/Reports Reviewed Today:  Carotid US 12/12/13 R 1-39% L 40-59% FU 1 year  LHC 12/02/13 LM:   patent. LAD:  95% proximal in-stent restenosis. Diffuse disease throughout the remainder of the vessel that in the left ventricular apex. Reflux into the mammary is noted to reflux into the diagonal graft is also noted. LCx:  totally occluded.  40% stenosis proximal to the first obtuse marginal; the distal circumflex is totally occluded. RCA:  totally occluded distally. SVG to OM 1 and OM 2 is widely patent. SVG to diagonal #1 is widely patent. SVG to PDA is widely patent. LIMA to LAD is widely patent EF:  60%.  Echo 03/06/12 - EF 55% to 60%. Grade 2 diastolic dysfunction). - Aortic valve: There was no stenosis. - Mitral valve: Moderately calcified annulus. Trivialregurgitation. - Left atrium: The atrium was mildly dilated. - Right ventricle: Poorly visualized. The cavity size was mildly dilated. Systolic function was normal. - Right atrium: The atrium was mildly dilated. - Tricuspid valve: Peak RV-RA  gradient: 29mm Hg (S). - Pulmonary arteries: PA peak pressure: 39mm Hg (S), assuming RA pressure 10 mmHg. - Systemic veins: IVC was not visualized. Impressions:- Normal LV size and systolic function, EF 55-60%. Moderatediastolic dysfunction. The RV was poorly visualized butprobably mildly dilated with normal systolic function.Mild pulmonary hypertension.  Myoview 05/2005 Normal study, EF 63%   Past Medical History  Diagnosis Date  . CAD (coronary artery disease) 2009    Multivessel s/p PCI w/DES 2009 and CABG 2011  . HTN (hypertension)   . Type II or unspecified type diabetes mellitus without mention of complication, not stated as uncontrolled   . PVD (peripheral vascular disease)   . Diverticulosis of colon   . Morbid obesity   . Gout   . Depression   . Osteoarthritis   . Osteopenia   . Hyperlipidemia   . LBP (low back pain)     Lumbar disc disease/lumbar spinal stenosis  . Disc disease, degenerative, cervical   . History of thrombocytopenia   . Anxiety   . GERD (gastroesophageal reflux disease)   . Barrett esophagus   . Gastroparesis   . Helicobacter pylori gastritis   . Allergic rhinitis   . Anemia   . Myocardial infarction   . Hypothyroidism   . H/O hiatal hernia   . Sick sinus syndrome     MDT Dual-chamber PPM implant 02/2012  . CKD (chronic kidney disease), stage II     GFR 60-89 ml/min    Past Surgical History  Procedure Laterality Date  . Coronary stent placement  Drug-eluting stent to the left anterior descending, circumflex and right coronary artery in Jan 2009  . Cholecystectomy    . Abdominal hysterectomy    . Tubal ligation    . Ovarian cyst removal    . Coronary artery bypass graft  2011    LIMA-LAD, SVG-DIAG, SVG-OM1-OM2, SVG-PDA  . Eye surgery      BIL CATARACT REMOVAL 06/2010  . Shoulder arthroscopy  06/16/2011    Procedure: ARTHROSCOPY SHOULDER;  Surgeon: Kennieth Rad, MD;  Location: [facility] OR;  Service: Orthopedics;  Laterality: Left;   LEFT SHOULDER ARTHROSCOPY ACROMIALPLASTY, POSSIBLE MINI OPEN CUFF REPAIR   . Cardiac catheterization      2011  DR COOPER (APPT NEXT WEEK)  . Pacemaker insertion  03/06/12    MDT Adapta L implanted by Dr Johney Frame for SSS  . Permanent pacemaker insertion N/A 03/06/2012    Procedure: PERMANENT PACEMAKER INSERTION;  Surgeon: Hillis Range, MD;  Location: [facility] CATH LAB;  Service: Cardiovascular;  Laterality: N/A;  . Left heart catheterization with coronary angiogram N/A 12/02/2013    Procedure: LEFT HEART CATHETERIZATION WITH CORONARY ANGIOGRAM;  Surgeon: Lesleigh Noe, MD;  Location: [facility] CATH LAB;  Service: Cardiovascular;  Laterality: N/A;     Current Outpatient Prescriptions  Medication Sig Dispense Refill  . acetaminophen (TYLENOL) 500 MG tablet Take 500 mg by mouth 2 (two) times daily as needed for pain.     Marland Kitchen allopurinol (ZYLOPRIM) 100 MG tablet Take 100 mg by mouth daily.    Marland Kitchen aspirin 81 MG tablet Take 81 mg by mouth daily.    Marland Kitchen atorvastatin (LIPITOR) 20 MG tablet Take one tablet by mouth daily--Reveal study Drug 1 tablet 0  . chlorthalidone (HYGROTON) 25 MG tablet Take 1 tablet (25 mg total) by mouth daily. (Patient not taking: Reported on 08/03/2014) 30 tablet 0  . clopidogrel (PLAVIX) 75 MG tablet Take 1 tablet (75 mg total) by mouth daily. 90 tablet 0  . colchicine 0.6 MG tablet Take 0.6 mg by mouth daily as needed (gout).     Marland Kitchen diclofenac (VOLTAREN) 75 MG EC tablet Take 75 mg by mouth 2 (two) times daily.     . furosemide (LASIX) 40 MG tablet Take 40 mg by mouth daily.    . iron polysaccharides (NIFEREX) 150 MG capsule Take 150 mg by mouth daily.      Marland Kitchen levothyroxine (SYNTHROID, LEVOTHROID) 100 MCG tablet Take 1 tablet (100 mcg total) by mouth daily. (Patient not taking: Reported on 08/03/2014) 30 tablet 0  . losartan (COZAAR) 100 MG tablet Take 1 tablet (100 mg total) by mouth daily. (Patient not taking: Reported on 08/03/2014) 90 tablet 3  . nitroGLYCERIN (NITROSTAT) 0.4 MG SL tablet  Place 1 tablet (0.4 mg total) under the tongue every 5 (five) minutes as needed for chest pain. (Patient taking differently: Place 0.4 mg under the tongue every 5 (five) minutes as needed for chest pain (MAX 3 TABLETS). ) 25 tablet 3  . NONFORMULARY OR COMPOUNDED ITEM Anacetrapib 100mg  or matching placebo daily (Reveal study drug)    . ONE TOUCH ULTRA TEST test strip 1 each by Other route daily as needed (blood sugar).      No current facility-administered medications for this visit.    Allergies:   Ciprofloxacin; Codeine; Hydrocodone; Penicillins; Shellfish allergy; Diltiazem hcl; Sulfonamide derivatives; Lovastatin; and Metformin    Social History:  The patient  reports that she has quit smoking. She has never used smokeless tobacco. She reports that she  does not drink alcohol or use illicit drugs.   Family History:  The patient's family history includes Coronary artery disease in her other; Diabetes in her mother; Heart attack in her mother; Hypertension in her mother; Stroke in her father.    ROS:   Please see the history of present illness.   ROS    PHYSICAL EXAM: VS:  There were no vitals taken for this visit.    Wt Readings from Last 3 Encounters:  06/13/14 247 lb 3.2 oz (112.129 kg)  06/09/14 245 lb 1.9 oz (111.186 kg)  05/19/14 239 lb 9.6 oz (108.682 kg)     GEN: Well nourished, well developed, in no acute distress HEENT: normal Neck: no JVD, no carotid bruits, no masses Cardiac:  Normal S1/S2, RRR; no murmur ,  no rubs or gallops, no edema  Respiratory:  clear to auscultation bilaterally, no wheezing, rhonchi or rales. GI: soft, nontender, nondistended, + BS MS: no deformity or atrophy Skin: warm and dry  Neuro:  CNs II-XII intact, Strength and sensation are intact Psych: Normal affect   EKG:  EKG is ordered today.  It demonstrates:      Recent Labs: 09/25/2013: Pro B Natriuretic peptide (BNP) 61.0 11/29/2013: ALT 20; Magnesium 2.2 08/03/2014: BUN 28*;  Creatinine, Ser 1.05; Hemoglobin 12.1; Platelets 186; Potassium 3.9; Sodium 137    Lipid Panel    Component Value Date/Time   CHOL 136 11/30/2013 0422   TRIG 107 11/30/2013 0422   TRIG 192* 04/20/2006 1246   HDL 90 11/30/2013 0422   CHOLHDL 1.5 11/30/2013 0422   CHOLHDL 4.1 CALC 04/20/2006 1246   VLDL 21 11/30/2013 0422   LDLCALC 25 11/30/2013 0422   LDLDIRECT 82.5 09/11/2009 0000   LDLDIRECT 125.8 04/20/2006 1246      ASSESSMENT AND PLAN:  Coronary artery disease involving native coronary artery of native heart without angina pectoris  Chronic diastolic CHF (congestive heart failure)  Essential hypertension  Hyperlipidemia  Carotid stenosis, bilateral  CKD (chronic kidney disease) stage 2, GFR 60-89 ml/min     Current medicines are reviewed at length with the patient today.  Concerns regarding medicines are as outlined above.  The following changes have been made:    As above  Labs/ tests ordered today include:   No orders of the defined types were placed in this encounter.     Disposition:   FU with    Signed, Tereso Newcomer, PA-C, MHS 09/22/2014 8:40 AM    [facility] [address] [place], [place], [place]  [address] Phone: [phone]; Fax: [phone]    This encounter was created in error - please disregard.

## 2014-09-22 ENCOUNTER — Encounter: Payer: Medicare Other | Admitting: Physician Assistant

## 2014-09-22 DIAGNOSIS — J398 Other specified diseases of upper respiratory tract: Secondary | ICD-10-CM | POA: Diagnosis not present

## 2014-09-22 DIAGNOSIS — I110 Hypertensive heart disease with heart failure: Secondary | ICD-10-CM | POA: Diagnosis not present

## 2014-09-22 DIAGNOSIS — I1310 Hypertensive heart and chronic kidney disease without heart failure, with stage 1 through stage 4 chronic kidney disease, or unspecified chronic kidney disease: Secondary | ICD-10-CM | POA: Diagnosis not present

## 2014-09-29 ENCOUNTER — Encounter: Payer: Self-pay | Admitting: Cardiology

## 2014-09-30 ENCOUNTER — Encounter (INDEPENDENT_AMBULATORY_CARE_PROVIDER_SITE_OTHER): Payer: Medicare Other | Admitting: Ophthalmology

## 2014-10-01 ENCOUNTER — Encounter: Payer: Self-pay | Admitting: Internal Medicine

## 2014-10-01 DIAGNOSIS — E0800 Diabetes mellitus due to underlying condition with hyperosmolarity without nonketotic hyperglycemic-hyperosmolar coma (NKHHC): Secondary | ICD-10-CM | POA: Diagnosis not present

## 2014-10-01 DIAGNOSIS — I10 Essential (primary) hypertension: Secondary | ICD-10-CM | POA: Diagnosis not present

## 2014-10-01 DIAGNOSIS — J398 Other specified diseases of upper respiratory tract: Secondary | ICD-10-CM | POA: Diagnosis not present

## 2014-10-06 ENCOUNTER — Encounter (INDEPENDENT_AMBULATORY_CARE_PROVIDER_SITE_OTHER): Payer: Medicare Other | Admitting: Ophthalmology

## 2014-10-06 ENCOUNTER — Other Ambulatory Visit: Payer: Self-pay | Admitting: Cardiology

## 2014-10-06 DIAGNOSIS — H43813 Vitreous degeneration, bilateral: Secondary | ICD-10-CM

## 2014-10-06 DIAGNOSIS — H35033 Hypertensive retinopathy, bilateral: Secondary | ICD-10-CM

## 2014-10-06 DIAGNOSIS — E11329 Type 2 diabetes mellitus with mild nonproliferative diabetic retinopathy without macular edema: Secondary | ICD-10-CM

## 2014-10-06 DIAGNOSIS — H3532 Exudative age-related macular degeneration: Secondary | ICD-10-CM | POA: Diagnosis not present

## 2014-10-06 DIAGNOSIS — H3531 Nonexudative age-related macular degeneration: Secondary | ICD-10-CM | POA: Diagnosis not present

## 2014-10-06 DIAGNOSIS — I10 Essential (primary) hypertension: Secondary | ICD-10-CM | POA: Diagnosis not present

## 2014-10-06 DIAGNOSIS — E11319 Type 2 diabetes mellitus with unspecified diabetic retinopathy without macular edema: Secondary | ICD-10-CM

## 2014-11-03 ENCOUNTER — Encounter (INDEPENDENT_AMBULATORY_CARE_PROVIDER_SITE_OTHER): Payer: Medicare Other | Admitting: Ophthalmology

## 2014-11-20 ENCOUNTER — Encounter (INDEPENDENT_AMBULATORY_CARE_PROVIDER_SITE_OTHER): Payer: Medicare Other | Admitting: Ophthalmology

## 2014-12-08 ENCOUNTER — Ambulatory Visit (INDEPENDENT_AMBULATORY_CARE_PROVIDER_SITE_OTHER): Payer: Medicare Other | Admitting: *Deleted

## 2014-12-08 DIAGNOSIS — I495 Sick sinus syndrome: Secondary | ICD-10-CM

## 2014-12-08 NOTE — Progress Notes (Signed)
PPM remote received.  

## 2014-12-10 LAB — CUP PACEART REMOTE DEVICE CHECK
Battery Impedance: 184 Ohm
Battery Remaining Longevity: 113 mo
Brady Statistic AP VP Percent: 52 %
Brady Statistic AP VS Percent: 28 %
Brady Statistic AS VP Percent: 8 %
Brady Statistic AS VS Percent: 12 %
Lead Channel Impedance Value: 427 Ohm
Lead Channel Pacing Threshold Amplitude: 0.5 V
Lead Channel Pacing Threshold Pulse Width: 0.4 ms
Lead Channel Sensing Intrinsic Amplitude: 5.6 mV
Lead Channel Setting Pacing Amplitude: 2 V
Lead Channel Setting Pacing Pulse Width: 0.4 ms
MDC IDC MSMT BATTERY VOLTAGE: 2.79 V
MDC IDC MSMT LEADCHNL RV IMPEDANCE VALUE: 438 Ohm
MDC IDC MSMT LEADCHNL RV PACING THRESHOLD AMPLITUDE: 0.875 V
MDC IDC MSMT LEADCHNL RV PACING THRESHOLD PULSEWIDTH: 0.4 ms
MDC IDC SESS DTM: 20160829174625
MDC IDC SET LEADCHNL RV PACING AMPLITUDE: 2.5 V
MDC IDC SET LEADCHNL RV SENSING SENSITIVITY: 2 mV

## 2014-12-12 ENCOUNTER — Encounter: Payer: Self-pay | Admitting: Cardiology

## 2014-12-30 DIAGNOSIS — M1000 Idiopathic gout, unspecified site: Secondary | ICD-10-CM | POA: Diagnosis not present

## 2014-12-30 DIAGNOSIS — E119 Type 2 diabetes mellitus without complications: Secondary | ICD-10-CM | POA: Diagnosis not present

## 2014-12-30 DIAGNOSIS — I130 Hypertensive heart and chronic kidney disease with heart failure and stage 1 through stage 4 chronic kidney disease, or unspecified chronic kidney disease: Secondary | ICD-10-CM | POA: Diagnosis not present

## 2014-12-30 DIAGNOSIS — I10 Essential (primary) hypertension: Secondary | ICD-10-CM | POA: Diagnosis not present

## 2014-12-30 DIAGNOSIS — I1310 Hypertensive heart and chronic kidney disease without heart failure, with stage 1 through stage 4 chronic kidney disease, or unspecified chronic kidney disease: Secondary | ICD-10-CM | POA: Diagnosis not present

## 2015-01-06 ENCOUNTER — Other Ambulatory Visit: Payer: Self-pay | Admitting: *Deleted

## 2015-01-06 MED ORDER — CLOPIDOGREL BISULFATE 75 MG PO TABS: ORAL_TABLET | ORAL | Status: DC

## 2015-01-06 NOTE — Telephone Encounter (Signed)
Refill encounter for faxed Rx request sent to [facility] office.

## 2015-01-13 ENCOUNTER — Encounter: Payer: Self-pay | Admitting: Internal Medicine

## 2015-01-22 ENCOUNTER — Telehealth: Payer: Self-pay

## 2015-01-22 ENCOUNTER — Other Ambulatory Visit: Payer: Self-pay

## 2015-01-22 DIAGNOSIS — E785 Hyperlipidemia, unspecified: Secondary | ICD-10-CM

## 2015-01-22 NOTE — Telephone Encounter (Signed)
RE: Closing of REVEAL Research Study  Received: Today    Tonny BollmanMichael Cooper, MD  Starla LinkSally A Milks, RN; Iona CoachLauren W Barri Neidlinger, RN           thx Kennon RoundsSally.   Malene Blaydes - can you call in atorvastatin 20 mg daily and check lipids and LFT's in 3 months? thx       Previous Messages     ----- Message -----   From: Starla LinkSally A Milks, RN   Sent: 01/22/2015 12:26 PM    To: Tonny BollmanMichael Cooper, MD  Subject: Closing of REVEAL Research Study          Mrs. [name],has been taking part in the REVEAL Research Study for the past 4 years. As part of the study, she has been taking atorvastatin 20 mg daily and anacetrapib 100 mg or placebo daily. Both medications were provided to the patient as part of the study. The study has ended and she will need a prescription for a statin. It is recommended that she continues on a similar lipid-lowering potency to that which she has been taking during the study. Also, it is advised against measuring lipids at this stage as it may be difficult to interpret the results until several weeks after stopping the study treatment. She will stop all study medication on 01/27/2015.   So that statin therapy is not interrupted, could you please call in a prescription for statin therapy for [name].   Thank you, Claire ShownSally Milks, RN, Research Nurse                 Attempted to reach the pt but no answer at home number.

## 2015-01-26 MED ORDER — ATORVASTATIN CALCIUM 20 MG PO TABS: 20.0000 mg | ORAL_TABLET | Freq: Every day | ORAL | Status: DC

## 2015-01-26 NOTE — Telephone Encounter (Signed)
I spoke with the pt and made her aware that I will send in Rx for atorvastatin 20mg  daily.  The [facility].  I have arranged for the pt to have repeat labs 05/11/15.

## 2015-01-28 ENCOUNTER — Other Ambulatory Visit: Payer: Self-pay

## 2015-02-24 ENCOUNTER — Other Ambulatory Visit: Payer: Self-pay | Admitting: Cardiovascular Disease

## 2015-03-09 ENCOUNTER — Ambulatory Visit (INDEPENDENT_AMBULATORY_CARE_PROVIDER_SITE_OTHER): Payer: Medicare Other | Admitting: *Deleted

## 2015-03-09 ENCOUNTER — Telehealth: Payer: Self-pay | Admitting: Cardiology

## 2015-03-09 DIAGNOSIS — I495 Sick sinus syndrome: Secondary | ICD-10-CM

## 2015-03-09 NOTE — Progress Notes (Signed)
Remote pacemaker transmission.   

## 2015-03-09 NOTE — Telephone Encounter (Signed)
Spoke with pt and reminded pt of remote transmission that is due today. Pt verbalized understanding.   

## 2015-03-16 LAB — CUP PACEART REMOTE DEVICE CHECK
Battery Impedance: 184 Ohm
Battery Remaining Longevity: 113 mo
Battery Voltage: 2.79 V
Brady Statistic AP VP Percent: 52 %
Brady Statistic AP VS Percent: 28 %
Brady Statistic AS VS Percent: 11 %
Implantable Lead Implant Date: 20131126
Implantable Lead Implant Date: 20131126
Implantable Lead Location: 753859
Implantable Lead Location: 753860
Implantable Lead Model: 5092
Lead Channel Impedance Value: 416 Ohm
Lead Channel Impedance Value: 428 Ohm
Lead Channel Pacing Threshold Amplitude: 0.5 V
Lead Channel Pacing Threshold Amplitude: 0.875 V
Lead Channel Pacing Threshold Pulse Width: 0.4 ms
Lead Channel Sensing Intrinsic Amplitude: 5.6 mV
Lead Channel Setting Pacing Amplitude: 2 V
Lead Channel Setting Pacing Amplitude: 2.5 V
Lead Channel Setting Pacing Pulse Width: 0.4 ms
Lead Channel Setting Sensing Sensitivity: 2 mV
MDC IDC MSMT LEADCHNL RA PACING THRESHOLD PULSEWIDTH: 0.4 ms
MDC IDC SESS DTM: 20161128163204
MDC IDC STAT BRADY AS VP PERCENT: 8 %

## 2015-03-17 ENCOUNTER — Encounter: Payer: Self-pay | Admitting: Cardiology

## 2015-03-30 ENCOUNTER — Ambulatory Visit: Payer: Medicare Other | Admitting: Cardiovascular Disease

## 2015-03-31 DIAGNOSIS — I130 Hypertensive heart and chronic kidney disease with heart failure and stage 1 through stage 4 chronic kidney disease, or unspecified chronic kidney disease: Secondary | ICD-10-CM | POA: Diagnosis not present

## 2015-03-31 DIAGNOSIS — M10172 Lead-induced gout, left ankle and foot: Secondary | ICD-10-CM | POA: Diagnosis not present

## 2015-03-31 DIAGNOSIS — I110 Hypertensive heart disease with heart failure: Secondary | ICD-10-CM | POA: Diagnosis not present

## 2015-03-31 DIAGNOSIS — E0821 Diabetes mellitus due to underlying condition with diabetic nephropathy: Secondary | ICD-10-CM | POA: Diagnosis not present

## 2015-03-31 DIAGNOSIS — M1000 Idiopathic gout, unspecified site: Secondary | ICD-10-CM | POA: Diagnosis not present

## 2015-03-31 DIAGNOSIS — I10 Essential (primary) hypertension: Secondary | ICD-10-CM | POA: Diagnosis not present

## 2015-03-31 DIAGNOSIS — E034 Atrophy of thyroid (acquired): Secondary | ICD-10-CM | POA: Diagnosis not present

## 2015-03-31 DIAGNOSIS — Z23 Encounter for immunization: Secondary | ICD-10-CM | POA: Diagnosis not present

## 2015-04-16 ENCOUNTER — Encounter: Payer: Self-pay | Admitting: Cardiovascular Disease

## 2015-04-16 ENCOUNTER — Ambulatory Visit (INDEPENDENT_AMBULATORY_CARE_PROVIDER_SITE_OTHER): Payer: Medicare Other | Admitting: Cardiovascular Disease

## 2015-04-16 VITALS — BP 142/82 | HR 74 | Ht 67.0 in | Wt 240.2 lb

## 2015-04-16 DIAGNOSIS — R011 Cardiac murmur, unspecified: Secondary | ICD-10-CM

## 2015-04-16 DIAGNOSIS — I5032 Chronic diastolic (congestive) heart failure: Secondary | ICD-10-CM

## 2015-04-16 MED ORDER — CARVEDILOL 3.125 MG PO TABS: 3.1250 mg | ORAL_TABLET | Freq: Two times a day (BID) | ORAL | Status: DC

## 2015-04-16 MED ORDER — CLOPIDOGREL BISULFATE 75 MG PO TABS: ORAL_TABLET | ORAL | Status: DC

## 2015-04-16 NOTE — Progress Notes (Signed)
Cardiology Office Note Date:  04/17/2015   ID:  [name], DOB 1932-05-02, MRN [id number]  PCP:  Geraldo Pitter, MD  Cardiologist:  Tonny Bollman, MD    Chief Complaint  Patient presents with  . Congestive Heart Failure   History of Present Illness: [name] is a 82 y.o. female who presents for follow-up of coronary artery disease and CHF. The patient underwent multivessel CABG in 2011. She also has a history of sick sinus syndrome and underwent permanent pacemaker placement. Other comorbid conditions include diastolic heart failure, hypertension, type 2 diabetes, and chronic kidney disease. In 2015 she underwent cardiac catheterization after presenting with symptoms concerning for unstable angina pectoris. This demonstrated patency of all of her bypass grafts.  She strictly follows a no salt diet and reports leg swelling has dramatically improved.  She does report some markedly elevated blood pressures on occasion. She recently had a headache and checked her blood pressure , with a reading of 220 over 90. Otherwise she has felt well and denies shortness of breath, leg swelling, orthopnea, or PND. She's had no recent chest pain or pressure.  Past Medical History  Diagnosis Date  . CAD (coronary artery disease) 2009    Multivessel s/p PCI w/DES 2009 and CABG 2011  . HTN (hypertension)   . Type II or unspecified type diabetes mellitus without mention of complication, not stated as uncontrolled   . PVD (peripheral vascular disease) (HCC)   . Diverticulosis of colon   . Morbid obesity (HCC)   . Gout   . Depression   . Osteoarthritis   . Osteopenia   . Hyperlipidemia   . LBP (low back pain)     Lumbar disc disease/lumbar spinal stenosis  . Disc disease, degenerative, cervical   . History of thrombocytopenia   . Anxiety   . GERD (gastroesophageal reflux disease)   . Barrett esophagus   . Gastroparesis   . Helicobacter pylori gastritis   . Allergic rhinitis   . Anemia    . Myocardial infarction (HCC)   . Hypothyroidism   . H/O hiatal hernia   . Sick sinus syndrome [facility])     MDT Dual-chamber PPM implant 02/2012  . CKD (chronic kidney disease), stage II     GFR 60-89 ml/min    Past Surgical History  Procedure Laterality Date  . Coronary stent placement      Drug-eluting stent to the left anterior descending, circumflex and right coronary artery in Jan 2009  . Cholecystectomy    . Abdominal hysterectomy    . Tubal ligation    . Ovarian cyst removal    . Coronary artery bypass graft  2011    LIMA-LAD, SVG-DIAG, SVG-OM1-OM2, SVG-PDA  . Eye surgery      BIL CATARACT REMOVAL 06/2010  . Shoulder arthroscopy  06/16/2011    Procedure: ARTHROSCOPY SHOULDER;  Surgeon: Kennieth Rad, MD;  Location: [facility] OR;  Service: Orthopedics;  Laterality: Left;  LEFT SHOULDER ARTHROSCOPY ACROMIALPLASTY, POSSIBLE MINI OPEN CUFF REPAIR   . Cardiac catheterization      2011  DR Christiann Hagerty (APPT NEXT WEEK)  . Pacemaker insertion  03/06/12    MDT Adapta L implanted by Dr Johney Frame for SSS  . Permanent pacemaker insertion N/A 03/06/2012    Procedure: PERMANENT PACEMAKER INSERTION;  Surgeon: Hillis Range, MD;  Location: [facility] CATH LAB;  Service: Cardiovascular;  Laterality: N/A;  . Left heart catheterization with coronary angiogram N/A 12/02/2013    Procedure: LEFT HEART CATHETERIZATION  WITH CORONARY ANGIOGRAM;  Surgeon: Lesleigh Noe, MD;  Location: [facility] CATH LAB;  Service: Cardiovascular;  Laterality: N/A;    Current Outpatient Prescriptions  Medication Sig Dispense Refill  . acetaminophen (TYLENOL) 500 MG tablet Take 500 mg by mouth 2 (two) times daily as needed for pain.     Marland Kitchen allopurinol (ZYLOPRIM) 100 MG tablet Take 100 mg by mouth daily.    Marland Kitchen amLODipine (NORVASC) 5 MG tablet Take 5 mg by mouth at bedtime.    Marland Kitchen aspirin 81 MG tablet Take 81 mg by mouth daily.    Marland Kitchen atorvastatin (LIPITOR) 20 MG tablet Take 1 tablet (20 mg total) by mouth daily. 90 tablet 3  . clopidogrel (PLAVIX)  75 MG tablet Take 1 tablet by mouth  daily 90 tablet 3  . colchicine 0.6 MG tablet Take 0.6 mg by mouth daily as needed (gout).     Marland Kitchen diclofenac (VOLTAREN) 75 MG EC tablet Take 75 mg by mouth 2 (two) times daily.     . furosemide (LASIX) 40 MG tablet Take 40 mg by mouth daily.    . iron polysaccharides (NIFEREX) 150 MG capsule Take 150 mg by mouth daily.      Marland Kitchen levothyroxine (SYNTHROID, LEVOTHROID) 100 MCG tablet Take 1 tablet (100 mcg total) by mouth daily. 30 tablet 0  . losartan (COZAAR) 100 MG tablet Take 1 tablet (100 mg total) by mouth daily. 90 tablet 3  . nitroGLYCERIN (NITROSTAT) 0.4 MG SL tablet Place 1 tablet (0.4 mg total) under the tongue every 5 (five) minutes as needed for chest pain. (Patient taking differently: Place 0.4 mg under the tongue every 5 (five) minutes as needed for chest pain (MAX 3 TABLETS). ) 25 tablet 3  . ONE TOUCH ULTRA TEST test strip 1 each by Other route daily as needed (blood sugar).     . carvedilol (COREG) 3.125 MG tablet Take 1 tablet (3.125 mg total) by mouth 2 (two) times daily. 180 tablet 3   No current facility-administered medications for this visit.    Allergies:   Ciprofloxacin; Codeine; Hydrocodone; Penicillins; Shellfish allergy; Diltiazem hcl; Sulfonamide derivatives; Lovastatin; and Metformin   Social History:  The patient  reports that she has quit smoking. She has never used smokeless tobacco. She reports that she does not drink alcohol or use illicit drugs.   Family History:  The patient's  family history includes Coronary artery disease in her other; Diabetes in her mother; Heart attack in her mother; Hypertension in her mother; Stroke in her father.   ROS:  Please see the history of present illness.  All other systems are reviewed and negative.   PHYSICAL EXAM: VS:  BP 142/82 mmHg  Pulse 74  Ht 5\' 7"  (1.702 m)  Wt 240 lb 3.2 oz (108.954 kg)  BMI 37.61 kg/m2 , BMI Body mass index is 37.61 kg/(m^2). GEN: Well nourished, well  developed, in no acute distress HEENT: normal Neck: no JVD, no masses. No carotid bruits Cardiac: RRR with 2/6 harsh mid-peaking systolic murmur at the LSB            Respiratory:  clear to auscultation bilaterally, normal work of breathing GI: soft, nontender, nondistended, + BS MS: no deformity or atrophy Ext: no pretibial edema, pedal pulses 2+= bilaterally Skin: warm and dry, no rash Neuro:  Strength and sensation are intact Psych: euthymic mood, full affect  EKG:  EKG is ordered today. The ekg ordered today shows AV sequential pacing 72 bpm  Recent  Labs: 08/03/2014: BUN 28*; Creatinine, Ser 1.05; Hemoglobin 12.1; Platelets 186; Potassium 3.9; Sodium 137   Lipid Panel     Component Value Date/Time   CHOL 136 11/30/2013 0422   TRIG 107 11/30/2013 0422   TRIG 192* 04/20/2006 1246   HDL 90 11/30/2013 0422   CHOLHDL 1.5 11/30/2013 0422   CHOLHDL 4.1 CALC 04/20/2006 1246   VLDL 21 11/30/2013 0422   LDLCALC 25 11/30/2013 0422   LDLDIRECT 82.5 09/11/2009 0000   LDLDIRECT 125.8 04/20/2006 1246      Wt Readings from Last 3 Encounters:  04/16/15 240 lb 3.2 oz (108.954 kg)  06/13/14 247 lb 3.2 oz (112.129 kg)  06/09/14 245 lb 1.9 oz (111.186 kg)     Cardiac Studies Reviewed: 2D Echo 03/06/2012: Impressions:  - Normal LV size and systolic function, EF 55-60%. Moderate diastolic dysfunction. The RV was poorly visualized but probably mildly dilated with normal systolic function. Mild pulmonary hypertension.  ASSESSMENT AND PLAN: 1.  CAD, native vessel: no symptoms of angina. Overall stable without complaints at present. medicatinos reviewed and will be continued. Add coreg 3.125 mg BID.  2. Chronic diastolic heart failure, NYHA 2: appears euvolemic and clear lung fields on exam. Add coreg for more consistent BP control. FU 3 months with an echo to assess harsh systolic murmur/chronic CHF. Counseled regarding diet/sodium restriction.  3. HTN with CHF: add coreg.  Continue lasix, losartan, amlodipine.  4. Hyperlipidemia: on statin drug. FU lipids at time of ROV  Current medicines are reviewed with the patient today.  The patient does not have concerns regarding medicines.  Labs/ tests ordered today include:   Orders Placed This Encounter  Procedures  . Basic Metabolic Panel (BMET)  . EKG 12-Lead  . Echocardiogram   Disposition:   FU 3 months with APP  Signed, Tonny Bollmanooper, Albin Duckett, MD  04/17/2015 7:20 PM    [facility] [address], [place], [place]  [address] Phone: [phone](336) [phone]; Fax: [phone]) [phone]

## 2015-04-16 NOTE — Patient Instructions (Signed)
Medication Instructions:  Your physician has recommended you make the following change in your medication:  1. START Carvedilol 3.125mg  take one tablet by mouth twice a day  Labwork: Your physician recommends that you return for a FASTING LIPID and CMP on 05/11/15--nothing to eat or drink after midnight, lab opens at 7:30 AM  Testing/Procedures: Your physician has requested that you have an echocardiogram in 3 MONTHS. Echocardiography is a painless test that uses sound waves to create images of your heart. It provides your doctor with information about the size and shape of your heart and how well your heart's chambers and valves are working. This procedure takes approximately one hour. There are no restrictions for this procedure.  Follow-Up: Your physician recommends that you schedule a follow-up appointment in: 3 MONTHS with PA/NP   Any Other Special Instructions Will Be Listed Below (If Applicable).     If you need a refill on your cardiac medications before your next appointment, please call your pharmacy.

## 2015-04-21 DIAGNOSIS — M85851 Other specified disorders of bone density and structure, right thigh: Secondary | ICD-10-CM | POA: Diagnosis not present

## 2015-04-21 DIAGNOSIS — M85852 Other specified disorders of bone density and structure, left thigh: Secondary | ICD-10-CM | POA: Diagnosis not present

## 2015-05-11 ENCOUNTER — Other Ambulatory Visit: Payer: Medicare Other

## 2015-05-11 ENCOUNTER — Other Ambulatory Visit (INDEPENDENT_AMBULATORY_CARE_PROVIDER_SITE_OTHER): Payer: Medicare Other | Admitting: *Deleted

## 2015-05-11 DIAGNOSIS — I5032 Chronic diastolic (congestive) heart failure: Secondary | ICD-10-CM

## 2015-05-11 DIAGNOSIS — R011 Cardiac murmur, unspecified: Secondary | ICD-10-CM

## 2015-05-11 DIAGNOSIS — E785 Hyperlipidemia, unspecified: Secondary | ICD-10-CM

## 2015-05-11 LAB — HEPATIC FUNCTION PANEL
ALBUMIN: 3.7 g/dL (ref 3.6–5.1)
ALT: 20 U/L (ref 6–29)
AST: 23 U/L (ref 10–35)
Alkaline Phosphatase: 72 U/L (ref 33–130)
BILIRUBIN TOTAL: 0.5 mg/dL (ref 0.2–1.2)
Bilirubin, Direct: 0.1 mg/dL (ref <=0.2)
Indirect Bilirubin: 0.4 mg/dL (ref 0.2–1.2)
Total Protein: 6.8 g/dL (ref 6.1–8.1)

## 2015-05-11 LAB — LIPID PANEL
CHOL/HDL RATIO: 1.5 ratio (ref <=5.0)
Cholesterol: 149 mg/dL (ref 125–200)
HDL: 97 mg/dL (ref >=46)
LDL CALC: 39 mg/dL (ref <130)
Triglycerides: 66 mg/dL (ref <150)
VLDL: 13 mg/dL (ref <30)

## 2015-05-11 NOTE — Addendum Note (Signed)
Addended by: Tonita Phoenix on: 05/11/2015 07:49 AM   Modules accepted: Orders

## 2015-05-12 LAB — BASIC METABOLIC PANEL
BUN: 29 mg/dL — ABNORMAL HIGH (ref 7–25)
CALCIUM: 9.8 mg/dL (ref 8.6–10.4)
CO2: 25 mmol/L (ref 20–31)
Chloride: 105 mmol/L (ref 98–110)
Creat: 0.99 mg/dL — ABNORMAL HIGH (ref 0.60–0.88)
Glucose, Bld: 51 mg/dL — ABNORMAL LOW (ref 65–99)
POTASSIUM: 4 mmol/L (ref 3.5–5.3)
Sodium: 138 mmol/L (ref 135–146)

## 2015-06-10 ENCOUNTER — Encounter: Payer: Self-pay | Admitting: *Deleted

## 2015-06-19 ENCOUNTER — Telehealth: Payer: Self-pay | Admitting: Cardiology

## 2015-06-19 NOTE — Telephone Encounter (Signed)
Spoke w/ pt and she informed me that she has lost some pieces to her home monitor but isn't sure which pieces she has lost. She agreed to an appt on 08-17-15 at 4:30 PM with [facility] and she is going to bring her monitor w/ her so we can see what is missing and order it for her.

## 2015-06-30 DIAGNOSIS — I130 Hypertensive heart and chronic kidney disease with heart failure and stage 1 through stage 4 chronic kidney disease, or unspecified chronic kidney disease: Secondary | ICD-10-CM | POA: Diagnosis not present

## 2015-06-30 DIAGNOSIS — J390 Retropharyngeal and parapharyngeal abscess: Secondary | ICD-10-CM | POA: Diagnosis not present

## 2015-06-30 DIAGNOSIS — N189 Chronic kidney disease, unspecified: Secondary | ICD-10-CM | POA: Diagnosis not present

## 2015-06-30 DIAGNOSIS — E039 Hypothyroidism, unspecified: Secondary | ICD-10-CM | POA: Diagnosis not present

## 2015-07-12 NOTE — Progress Notes (Signed)
Cardiology Office Note:    Date:  07/13/2015   ID:  [name], DOB [birth year], MRN [id number]  PCP:  [name],Samantha J, MD  Cardiologist:  Dr. Tonny BollmanMichael Cooper   Electrophysiologist:  Dr. Hillis RangeJames Allred   Chief Complaint  Patient presents with  . Follow-up    CAD, CHF    History of Present Illness:     Samantha SneddonShirley A [name] is a 82 y.o. female with a hx of CAD status post CABG in 2011, sick sinus syndrome status post pacemaker, diastolic HF, HTN, diabetes, CKD. Cardiac catheterization in 11/2013 demonstrated patent bypass grafts. Last seen by Dr. Excell Seltzerooper 04/16/15. Coreg was added to her regimen. Echo was arranged for eval of a murmur. This was done this AM.   Returns for FU.  Here alone.  Doing well.  She walks with a walker.  DOE is stable.  She denies orthopnea, PND, weight changes.  LE edema is stable.  Denies chest pain, syncope.  She has increased bruising.  O/w denies bleeding issues.     Past Medical History  Diagnosis Date  . CAD (coronary artery disease) 2009    Multivessel s/p PCI w/DES 2009 and CABG 2011  . HTN (hypertension)   . Type II or unspecified type diabetes mellitus without mention of complication, not stated as uncontrolled   . PVD (peripheral vascular disease) (HCC)   . Diverticulosis of colon   . Morbid obesity (HCC)   . Gout   . Depression   . Osteoarthritis   . Osteopenia   . Hyperlipidemia   . LBP (low back pain)     Lumbar disc disease/lumbar spinal stenosis  . Disc disease, degenerative, cervical   . History of thrombocytopenia   . Anxiety   . GERD (gastroesophageal reflux disease)   . Barrett esophagus   . Gastroparesis   . Helicobacter pylori gastritis   . Allergic rhinitis   . Anemia   . Myocardial infarction (HCC)   . Hypothyroidism   . H/O hiatal hernia   . Sick sinus syndrome [facility](HCC)     MDT Dual-chamber PPM implant 02/2012  . CKD (chronic kidney disease), stage II     GFR 60-89 ml/min    Past Surgical History  Procedure Laterality Date    . Coronary stent placement      Drug-eluting stent to the left anterior descending, circumflex and right coronary artery in Jan 2009  . Cholecystectomy    . Abdominal hysterectomy    . Tubal ligation    . Ovarian cyst removal    . Coronary artery bypass graft  2011    LIMA-LAD, SVG-DIAG, SVG-OM1-OM2, SVG-PDA  . Eye surgery      BIL CATARACT REMOVAL 06/2010  . Shoulder arthroscopy  06/16/2011    Procedure: ARTHROSCOPY SHOULDER;  Surgeon: Kennieth RadArthur F Carter, MD;  [facility];  Service: Orthopedics;  Laterality: Left;  LEFT SHOULDER ARTHROSCOPY ACROMIALPLASTY, POSSIBLE MINI OPEN CUFF REPAIR   . Cardiac catheterization      2011  DR COOPER (APPT NEXT WEEK)  . Pacemaker insertion  03/06/12    MDT Adapta L implanted by Dr Johney FrameAllred for SSS  . Permanent pacemaker insertion N/A 03/06/2012    Procedure: PERMANENT PACEMAKER INSERTION;  Surgeon: Hillis RangeJames Allred, MD;  [facility];  Service: Cardiovascular;  Laterality: N/A;  . Left heart catheterization with coronary angiogram N/A 12/02/2013    Procedure: LEFT HEART CATHETERIZATION WITH CORONARY ANGIOGRAM;  Surgeon: Lesleigh NoeHenry W Smith III, MD;  [facility];  Service: Cardiovascular;  Laterality: N/A;    Current Medications: Outpatient Prescriptions Prior to Visit  Medication Sig Dispense Refill  . acetaminophen (TYLENOL) 500 MG tablet Take 500 mg by mouth 2 (two) times daily as needed for pain.     Marland Kitchen allopurinol (ZYLOPRIM) 100 MG tablet Take 100 mg by mouth daily.    Marland Kitchen aspirin 81 MG tablet Take 81 mg by mouth daily.    Marland Kitchen atorvastatin (LIPITOR) 20 MG tablet Take 1 tablet (20 mg total) by mouth daily. 90 tablet 3  . carvedilol (COREG) 3.125 MG tablet Take 1 tablet (3.125 mg total) by mouth 2 (two) times daily. 180 tablet 3  . clopidogrel (PLAVIX) 75 MG tablet Take 1 tablet by mouth  daily 90 tablet 3  . colchicine 0.6 MG tablet Take 0.6 mg by mouth daily as needed (gout).     Marland Kitchen diclofenac (VOLTAREN) 75 MG EC tablet Take 75 mg by mouth 2  (two) times daily.     . furosemide (LASIX) 40 MG tablet Take 40 mg by mouth daily.    . iron polysaccharides (NIFEREX) 150 MG capsule Take 150 mg by mouth daily.      Marland Kitchen levothyroxine (SYNTHROID, LEVOTHROID) 100 MCG tablet Take 1 tablet (100 mcg total) by mouth daily. 30 tablet 0  . losartan (COZAAR) 100 MG tablet Take 1 tablet (100 mg total) by mouth daily. 90 tablet 3  . nitroGLYCERIN (NITROSTAT) 0.4 MG SL tablet Place 1 tablet (0.4 mg total) under the tongue every 5 (five) minutes as needed for chest pain. (Patient taking differently: Place 0.4 mg under the tongue every 5 (five) minutes as needed for chest pain (MAX 3 TABLETS). ) 25 tablet 3  . ONE TOUCH ULTRA TEST test strip 1 each by Other route daily as needed (blood sugar).     Marland Kitchen amLODipine (NORVASC) 5 MG tablet Take 5 mg by mouth at bedtime.     No facility-administered medications prior to visit.     Allergies:   Ciprofloxacin; Codeine; Hydrocodone; Penicillins; Shellfish allergy; Diltiazem hcl; Sulfonamide derivatives; Lovastatin; and Metformin   Social History   Social History  . Marital Status: Widowed    Spouse Name: N/A  . Number of Children: N/A  . Years of Education: N/A   Occupational History  . RETIRED LPN    Social History Main Topics  . Smoking status: Former Games developer  . Smokeless tobacco: Never Used     Comment: quit 30 yrs ago  . Alcohol Use: No  . Drug Use: No  . Sexual Activity: Not Asked   Other Topics Concern  . None   Social History Narrative   Widowed 2004.., Lives alone..Family history is negative for premature coronary artery disease. Mother died at age 17 with heart disease in her later years, father died at age 70 from a stroke.Marland KitchenShe  has 8 siblings, none of whom have coronary artery disease.           Family History:  The patient's family history includes Coronary artery disease in her other; Diabetes in her mother; Heart attack in her mother; Hypertension in her mother; Stroke in her father.    ROS:   Please see the history of present illness.    Review of Systems  Hematologic/Lymphatic: Bruises/bleeds easily.   All other systems reviewed and are negative.   Physical Exam:    VS:  BP 170/72 mmHg  Pulse 66  Ht  (1.676 m)  Wt 236 lb 12.8 oz (107.412 kg)  BMI 38.24 kg/m2   GEN: Well nourished, well developed, in no acute distress HEENT: normal Neck: no JVD, no masses Cardiac: Normal S1/S2, RRR; early 1/6 systolic murmur RUSB, trace - 1+ bilat LE edema     Respiratory:  clear to auscultation bilaterally; no wheezing, rhonchi or rales GI: soft, nontender, nondistended MS: no deformity or atrophy Skin: warm and dry Neuro: No focal deficits  Psych: Alert and oriented x 3, normal affect  Wt Readings from Last 3 Encounters:  07/13/15 236 lb 12.8 oz (107.412 kg)  04/16/15 240 lb 3.2 oz (108.954 kg)  06/13/14 247 lb 3.2 oz (112.129 kg)      Studies/Labs Reviewed:     EKG:  EKG is not ordered today.  The ekg ordered today demonstrates n/a  Recent Labs: 08/03/2014: Hemoglobin 12.1; Platelets 186 05/11/2015: ALT 20; BUN 29*; Creat 0.99*; Potassium 4.0; Sodium 138   Recent Lipid Panel    Component Value Date/Time   CHOL 149 05/11/2015 0749   TRIG 66 05/11/2015 0749   TRIG 192* 04/20/2006 1246   HDL 97 05/11/2015 0749   CHOLHDL 1.5 05/11/2015 0749   CHOLHDL 4.1 CALC 04/20/2006 1246   VLDL 13 05/11/2015 0749   LDLCALC 39 05/11/2015 0749   LDLDIRECT 82.5 09/11/2009 0000   LDLDIRECT 125.8 04/20/2006 1246    Additional studies/ records that were reviewed today include:   Carotid US 12/12/13 R 1-39% L 40-59% FU 1 year  LHC 12/02/13 LM: patent. LAD: 95% proximal in-stent restenosis. Diffuse disease throughout the remainder of the vessel that in the left ventricular apex. Reflux into the mammary is noted to reflux into the diagonal graft is also noted. LCx: totally occluded. 40% stenosis proximal to the first obtuse marginal; the distal circumflex is  totally occluded. RCA: totally occluded distally. SVG to OM 1 and OM 2 is widely patent. SVG to diagonal #1 is widely patent. SVG to PDA is widely patent. LIMA to LAD is widely patent EF: 60%.  Echo 03/06/12 EF 55% to 60%. Grade 2 diastolic dysfunction, MAC, trivial MR, mild LAE, normal RVSF, mild RAE, PASP 39 mmHg  Myoview 05/2005 Normal study, EF 63%   ASSESSMENT:     1. Coronary artery disease involving native coronary artery of native heart without angina pectoris   2. Chronic diastolic heart failure (HCC)   3. Essential hypertension   4. Hyperlipidemia   5. Carotid stenosis, bilateral   6. CKD (chronic kidney disease), stage 2 (mild)   7. S/P placement of cardiac pacemaker   8. Murmur     PLAN:     In order of problems listed above:  1. CAD - s/p CABG.  LHC with patent grafts in 2015.  No angina.  Continue ASA, Plavix, beta-blocker, statin.    2. Chronic diastolic CHF - Volume stable.  She weighs herself daily and limits NaCl.  Continue current Rx.   3. Essential hypertension - BP remains elevated.  HR in the 60s.  Will increase Amlodipine to 10 mg QD.   4. Hyperlipidemia - Continue statin.  5. Carotid stenosis, bilateral -  Overdue for carotid US. Will arrange.   6. CKD - Recent creatinine stable.    7. S/p PPM - FU with EP as planned.  8. Murmur - Echo from today is pending.   Medication Adjustments/Labs and Tests Ordered: Current medicines are reviewed at length with the patient today.  Concerns regarding medicines are outlined above.  Medication changes, Labs and Tests ordered today are outlined in  the Patient Instructions noted below. Patient Instructions  Medication Instructions:  1. INCREASE AMLODIPINE TO 10 MG DAILY  Labwork: NONE  Testing/Procedures: Your physician has requested that you have a carotid duplex. ANY TIME YOU ARE READY; JUST TRY TO HAVE THIS DONE BEFORE YOUR APPT WITH [name], PAC IN 3 MONTHS OR DR. Excell Seltzer APPT IN 6 MONTHS.  This test is an ultrasound of the carotid arteries in your neck. It looks at blood flow through these arteries that supply the brain with blood. Allow one hour for this exam. There are no restrictions or special instructions.  Follow-Up: 1. [name], PAC 3 MONTHS  2. Your physician wants you to follow-up in: 6 MONTHS WITH DR. Theodoro Parma will receive a reminder letter in the mail two months in advance. If you don't receive a letter, please call our office to schedule the follow-up appointment.  Any Other Special Instructions Will Be Listed Below (If Applicable).  If you need a refill on your cardiac medications before your next appointment, please call your pharmacy.   Signed, Tereso Newcomer, PA-C  07/13/2015 8:31 PM    [facility] [address] [place], [place], [place]  [address] Phone: [phone]; Fax: [phone]

## 2015-07-13 ENCOUNTER — Ambulatory Visit (INDEPENDENT_AMBULATORY_CARE_PROVIDER_SITE_OTHER): Payer: Medicare Other | Admitting: Physician Assistant

## 2015-07-13 ENCOUNTER — Ambulatory Visit (HOSPITAL_COMMUNITY): Payer: Medicare Other | Attending: Cardiovascular Disease

## 2015-07-13 ENCOUNTER — Other Ambulatory Visit: Payer: Self-pay

## 2015-07-13 ENCOUNTER — Encounter: Payer: Self-pay | Admitting: Physician Assistant

## 2015-07-13 VITALS — BP 170/72 | HR 66 | Ht 66.0 in | Wt 236.8 lb

## 2015-07-13 DIAGNOSIS — Z950 Presence of cardiac pacemaker: Secondary | ICD-10-CM | POA: Insufficient documentation

## 2015-07-13 DIAGNOSIS — I10 Essential (primary) hypertension: Secondary | ICD-10-CM | POA: Diagnosis not present

## 2015-07-13 DIAGNOSIS — E119 Type 2 diabetes mellitus without complications: Secondary | ICD-10-CM | POA: Diagnosis not present

## 2015-07-13 DIAGNOSIS — R011 Cardiac murmur, unspecified: Secondary | ICD-10-CM | POA: Insufficient documentation

## 2015-07-13 DIAGNOSIS — I5032 Chronic diastolic (congestive) heart failure: Secondary | ICD-10-CM | POA: Insufficient documentation

## 2015-07-13 DIAGNOSIS — N182 Chronic kidney disease, stage 2 (mild): Secondary | ICD-10-CM

## 2015-07-13 DIAGNOSIS — E669 Obesity, unspecified: Secondary | ICD-10-CM | POA: Diagnosis not present

## 2015-07-13 DIAGNOSIS — E785 Hyperlipidemia, unspecified: Secondary | ICD-10-CM | POA: Insufficient documentation

## 2015-07-13 DIAGNOSIS — Z951 Presence of aortocoronary bypass graft: Secondary | ICD-10-CM | POA: Insufficient documentation

## 2015-07-13 DIAGNOSIS — I110 Hypertensive heart disease with heart failure: Secondary | ICD-10-CM | POA: Diagnosis not present

## 2015-07-13 DIAGNOSIS — Z6837 Body mass index (BMI) 37.0-37.9, adult: Secondary | ICD-10-CM | POA: Diagnosis not present

## 2015-07-13 DIAGNOSIS — I059 Rheumatic mitral valve disease, unspecified: Secondary | ICD-10-CM | POA: Insufficient documentation

## 2015-07-13 DIAGNOSIS — I6523 Occlusion and stenosis of bilateral carotid arteries: Secondary | ICD-10-CM

## 2015-07-13 DIAGNOSIS — I2510 Atherosclerotic heart disease of native coronary artery without angina pectoris: Secondary | ICD-10-CM | POA: Diagnosis not present

## 2015-07-13 DIAGNOSIS — I351 Nonrheumatic aortic (valve) insufficiency: Secondary | ICD-10-CM | POA: Diagnosis not present

## 2015-07-13 MED ORDER — AMLODIPINE BESYLATE 10 MG PO TABS: 10.0000 mg | ORAL_TABLET | Freq: Every day | ORAL | Status: DC

## 2015-07-13 NOTE — Patient Instructions (Addendum)
Medication Instructions:  1. INCREASE AMLODIPINE TO 10 MG DAILY  Labwork: NONE  Testing/Procedures: Your physician has requested that you have a carotid duplex. ANY TIME YOU ARE READY; JUST TRY TO HAVE THIS DONE BEFORE YOUR APPT WITH SCOTT WEAVER, PAC IN 3 MONTHS OR DR. Excell SeltzerOOPER APPT IN 6 MONTHS. This test is an ultrasound of the carotid arteries in your neck. It looks at blood flow through these arteries that supply the brain with blood. Allow one hour for this exam. There are no restrictions or special instructions.  Follow-Up: 1. SCOTT WEAVER, PAC 3 MONTHS  2. Your physician wants you to follow-up in: 6 MONTHS WITH DR. Theodoro ParmaOOPER You will receive a reminder letter in the mail two months in advance. If you don't receive a letter, please call our office to schedule the follow-up appointment.  Any Other Special Instructions Will Be Listed Below (If Applicable).  If you need a refill on your cardiac medications before your next appointment, please call your pharmacy.

## 2015-08-24 ENCOUNTER — Ambulatory Visit (INDEPENDENT_AMBULATORY_CARE_PROVIDER_SITE_OTHER): Payer: Medicare Other | Admitting: *Deleted

## 2015-08-24 ENCOUNTER — Encounter: Payer: Self-pay | Admitting: Internal Medicine

## 2015-08-24 DIAGNOSIS — I495 Sick sinus syndrome: Secondary | ICD-10-CM

## 2015-08-25 LAB — CUP PACEART INCLINIC DEVICE CHECK
Battery Remaining Longevity: 108 mo
Brady Statistic AP VP Percent: 56 %
Brady Statistic AS VP Percent: 9 %
Brady Statistic AS VS Percent: 9 %
Implantable Lead Implant Date: 20131126
Implantable Lead Implant Date: 20131126
Implantable Lead Location: 753860
Implantable Lead Model: 5076
Implantable Lead Model: 5092
Lead Channel Impedance Value: 425 Ohm
Lead Channel Pacing Threshold Amplitude: 0.5 V
Lead Channel Pacing Threshold Amplitude: 0.5 V
Lead Channel Pacing Threshold Pulse Width: 0.4 ms
Lead Channel Pacing Threshold Pulse Width: 0.4 ms
Lead Channel Pacing Threshold Pulse Width: 0.4 ms
Lead Channel Setting Pacing Amplitude: 2 V
Lead Channel Setting Pacing Amplitude: 2.5 V
Lead Channel Setting Sensing Sensitivity: 2 mV
MDC IDC LEAD LOCATION: 753859
MDC IDC MSMT BATTERY IMPEDANCE: 208 Ohm
MDC IDC MSMT BATTERY VOLTAGE: 2.79 V
MDC IDC MSMT LEADCHNL RA IMPEDANCE VALUE: 421 Ohm
MDC IDC MSMT LEADCHNL RA PACING THRESHOLD PULSEWIDTH: 0.4 ms
MDC IDC MSMT LEADCHNL RA SENSING INTR AMPL: 1 mV
MDC IDC MSMT LEADCHNL RV PACING THRESHOLD AMPLITUDE: 0.75 V
MDC IDC MSMT LEADCHNL RV PACING THRESHOLD AMPLITUDE: 0.75 V
MDC IDC MSMT LEADCHNL RV SENSING INTR AMPL: 4 mV
MDC IDC SESS DTM: 20170515181158
MDC IDC SET LEADCHNL RV PACING PULSEWIDTH: 0.4 ms
MDC IDC STAT BRADY AP VS PERCENT: 26 %

## 2015-08-25 NOTE — Progress Notes (Signed)
Pacemaker check in clinic. Normal device function. Thresholds, sensing, impedances consistent with previous measurements. Device programmed to maximize longevity. (7) mode switches (<0.1%)--all <30 sec, no EGMs. No high ventricular rates noted. Device programmed at appropriate safety margins. Histogram distribution appropriate for patient activity level. Device programmed to optimize intrinsic conduction. Estimated longevity 9 years. Plan to follow up with AS in 3 months. Patient education completed including demonstration on how to use carelink monitor.

## 2015-09-29 DIAGNOSIS — E039 Hypothyroidism, unspecified: Secondary | ICD-10-CM | POA: Diagnosis not present

## 2015-09-29 DIAGNOSIS — K210 Gastro-esophageal reflux disease with esophagitis: Secondary | ICD-10-CM | POA: Diagnosis not present

## 2015-09-29 DIAGNOSIS — I10 Essential (primary) hypertension: Secondary | ICD-10-CM | POA: Diagnosis not present

## 2015-09-29 DIAGNOSIS — N189 Chronic kidney disease, unspecified: Secondary | ICD-10-CM | POA: Diagnosis not present

## 2015-09-29 DIAGNOSIS — Z0000 Encounter for general adult medical examination without abnormal findings: Secondary | ICD-10-CM | POA: Diagnosis not present

## 2015-09-29 DIAGNOSIS — M10172 Lead-induced gout, left ankle and foot: Secondary | ICD-10-CM | POA: Diagnosis not present

## 2015-09-29 DIAGNOSIS — I130 Hypertensive heart and chronic kidney disease with heart failure and stage 1 through stage 4 chronic kidney disease, or unspecified chronic kidney disease: Secondary | ICD-10-CM | POA: Diagnosis not present

## 2015-09-29 DIAGNOSIS — Z79899 Other long term (current) drug therapy: Secondary | ICD-10-CM | POA: Diagnosis not present

## 2015-10-23 NOTE — Progress Notes (Signed)
Cardiology Office Note:    Date:  10/26/2015   ID:  [name], DOB Jun 06, 1932, MRN [id number]  PCP:  Geraldo Pitter, MD  Cardiologist:  Dr. Tonny Bollman   Electrophysiologist:  Dr. Hillis Range   Referring MD: Renaye Rakers, MD   Chief Complaint  Patient presents with  . Follow-up    CHF, CAD    History of Present Illness:    [name] is a 82 y.o. female with a hx of CAD status post CABG in 2011, sick sinus syndrome status post pacemaker, diastolic HF, HTN, diabetes, CKD. Cardiac catheterization in 11/2013 demonstrated patent bypass grafts.   I saw her in FU 4/17.  Returns for FU.     Past Medical History  Diagnosis Date  . CAD (coronary artery disease) 2009    a. Multivessel s/p PCI w/DES 2009 // b. s/p CABG 2011  //  c. LHC 8/15: pLAD 95 ISR, LCx 100, pOM1 40, dRCA 100, S-OM1/OM2 ok, S-D1 ok, S-PDA ok, L-LAD ok, EF 60%  . HTN (hypertension)   . Type II or unspecified type diabetes mellitus without mention of complication, not stated as uncontrolled   . PVD (peripheral vascular disease) (HCC)   . Carotid artery disease (HCC)     a. Carotid US 9/15: RICA 1-39%; LICA 40-59% >> FU 1 year  . History of echocardiogram     a. Echo 11/13: EF 55% to 60%. Grade 2 diastolic dysfunction, MAC, trivial MR, mild LAE, normal RVSF, mild RAE, PASP 39 mmHg  //  b. Echo 4/17: EF 55-60%, normal wall motion, trivial AI, MAC, moderate LAE, mild RVE, PASP 35 mmHg  . Gout   . Depression   . Osteoarthritis   . Osteopenia   . Hyperlipidemia   . LBP (low back pain)     Lumbar disc disease/lumbar spinal stenosis  . Disc disease, degenerative, cervical   . History of thrombocytopenia   . Anxiety   . GERD (gastroesophageal reflux disease)   . Barrett esophagus   . Gastroparesis   . Helicobacter pylori gastritis   . Allergic rhinitis   . Anemia   . Myocardial infarction (HCC)   . Hypothyroidism   . H/O hiatal hernia   . Sick sinus syndrome [facility])     MDT Dual-chamber PPM implant  02/2012  . CKD (chronic kidney disease), stage II     GFR 60-89 ml/min  . Diverticulosis   . Morbid obesity [facility])     Past Surgical History  Procedure Laterality Date  . Coronary stent placement      Drug-eluting stent to the left anterior descending, circumflex and right coronary artery in Jan 2009  . Cholecystectomy    . Abdominal hysterectomy    . Tubal ligation    . Ovarian cyst removal    . Coronary artery bypass graft  2011    LIMA-LAD, SVG-DIAG, SVG-OM1-OM2, SVG-PDA  . Eye surgery      BIL CATARACT REMOVAL 06/2010  . Shoulder arthroscopy  06/16/2011    Procedure: ARTHROSCOPY SHOULDER;  Surgeon: Kennieth Rad, MD;  Location: [facility] OR;  Service: Orthopedics;  Laterality: Left;  LEFT SHOULDER ARTHROSCOPY ACROMIALPLASTY, POSSIBLE MINI OPEN CUFF REPAIR   . Cardiac catheterization      2011  DR COOPER (APPT NEXT WEEK)  . Pacemaker insertion  03/06/12    MDT Adapta L implanted by Dr Johney Frame for SSS  . Permanent pacemaker insertion N/A 03/06/2012    Procedure: PERMANENT PACEMAKER INSERTION;  Surgeon: Fayrene Fearing  Allred, MD;  Location: [facility] C[facility] LAB;  Service: Cardiovascular;  Laterality: N/A;  . Left heart catheterization with coronary angiogram N/A 12/02/2013    Procedure: LEFT HEART CATHETERIZATION WITH CORONARY ANGIOGRAM;  Surgeon: Lesleigh Noe, MD;  Location[facility] C[facility] LAB;  Service: Cardiovascular;  Laterality: N/A;    Current Medications: Outpatient Prescriptions Prior to Visit  Medication Sig Dispense Refill  . acetaminophen (TYLENOL) 500 MG tablet Take 500 mg by mouth 2 (two) times daily as needed for pain.     Marland Kitchen allopurinol (ZYLOPRIM) 100 MG tablet Take 100 mg by mouth daily.    Marland Kitchen amLODipine (NORVASC) 10 MG tablet Take 1 tablet (10 mg total) by mouth at bedtime. 90 tablet 3  . aspirin 81 MG tablet Take 81 mg by mouth daily.    Marland Kitchen atorvastatin (LIPITOR) 20 MG tablet Take 1 tablet (20 mg total) by mouth daily. 90 tablet 3  . carvedilol (COREG) 3.125 MG tablet Take 1 tablet (3.125 mg  total) by mouth 2 (two) times daily. 180 tablet 3  . chlorthalidone (HYGROTON) 25 MG tablet Take 25 mg by mouth daily.     . clopidogrel (PLAVIX) 75 MG tablet Take 1 tablet by mouth  daily 90 tablet 3  . colchicine 0.6 MG tablet Take 0.6 mg by mouth daily as needed (gout).     Marland Kitchen diclofenac (VOLTAREN) 75 MG EC tablet Take 75 mg by mouth 2 (two) times daily.     . furosemide (LASIX) 40 MG tablet Take 40 mg by mouth daily. Reported on 08/24/2015    . HUMULIN N 100 UNIT/ML injection Inject 10-45 Units into the skin 2 (two) times daily before a meal. 45 units in the morning and 10 units HS    . hydrALAZINE (APRESOLINE) 25 MG tablet Take 25 mg by mouth 3 (three) times daily.     . iron polysaccharides (NIFEREX) 150 MG capsule Take 150 mg by mouth daily.      Marland Kitchen levothyroxine (SYNTHROID, LEVOTHROID) 100 MCG tablet Take 1 tablet (100 mcg total) by mouth daily. 30 tablet 0  . losartan (COZAAR) 100 MG tablet Take 1 tablet (100 mg total) by mouth daily. 90 tablet 3  . nitroGLYCERIN (NITROSTAT) 0.4 MG SL tablet Place 1 tablet (0.4 mg total) under the tongue every 5 (five) minutes as needed for chest pain. (Patient taking differently: Place 0.4 mg under the tongue every 5 (five) minutes as needed for chest pain (MAX 3 TABLETS). ) 25 tablet 3  . ONE TOUCH ULTRA TEST test strip 1 each by Other route daily as needed (blood sugar).     . pioglitazone (ACTOS) 15 MG tablet Take 15 mg by mouth daily.      No facility-administered medications prior to visit.      Allergies:   Ciprofloxacin; Codeine; Hydrocodone; Penicillins; Shellfish allergy; Diltiazem hcl; Sulfonamide derivatives; Lovastatin; and Metformin   Social History   Social History  . Marital Status: Widowed    Spouse Name: N/A  . Number of Children: N/A  . Years of Education: N/A   Occupational History  . RETIRED LPN    Social History Main Topics  . Smoking status: Former Games developer  . Smokeless tobacco: Never Used     Comment: quit 30 yrs ago  .  Alcohol Use: No  . Drug Use: No  . Sexual Activity: Not on file   Other Topics Concern  . Not on file   Social History Narrative   Widowed 2004.., Lives alone..Family history  is negative for premature coronary artery disease. Mother died at age 80 with heart disease in her later years, father died at age 80 from a stroke.Marland Kitchen.She  has 8 siblings, none of whom have coronary artery disease.           Family History:  The patient's family history includes Coronary artery disease in her other; Diabetes in her mother; Heart attack in her mother; Hypertension in her mother; Stroke in her father.   ROS:   Please see the history of present illness.    ROS All other systems reviewed and are negative.   Physical Exam:    VS:  There were no vitals taken for this visit.    Wt Readings from Last 3 Encounters:  07/13/15 236 lb 12.8 oz (107.412 kg)  04/16/15 240 lb 3.2 oz (108.954 kg)  06/13/14 247 lb 3.2 oz (112.129 kg)     Physical Exam  Studies/Labs Reviewed:   EKG:  EKG is  ordered today.  The ekg ordered today demonstrates   Recent Labs: 05/11/2015: ALT 20; BUN 29*; Creat 0.99*; Potassium 4.0; Sodium 138   Recent Lipid Panel    Component Value Date/Time   CHOL 149 05/11/2015 0749   TRIG 66 05/11/2015 0749   TRIG 192* 04/20/2006 1246   HDL 97 05/11/2015 0749   CHOLHDL 1.5 05/11/2015 0749   CHOLHDL 4.1 CALC 04/20/2006 1246   VLDL 13 05/11/2015 0749   LDLCALC 39 05/11/2015 0749   LDLDIRECT 82.5 09/11/2009 0000   LDLDIRECT 125.8 04/20/2006 1246    Additional studies/ records that were reviewed today include:    Echo 07/13/15 Left ventricle: The cavity size was normal. Wall thickness was  normal. Systolic function was normal. The estimated ejection  fraction was in the range of 55% to 60%. Wall motion was normal;  there were no regional wall motion abnormalities. - Ventricular septum: Septal motion showed paradox. These changes  are consistent with right ventricular  pacing. - Aortic valve: There was trivial regurgitation. - Mitral valve: Calcified annulus. - Left atrium: The atrium was moderately dilated. - Right ventricle: The cavity size was mildly dilated. - Pulmonary arteries: PA peak pressure: 35 mm Hg (S).  Carotid US 12/12/13 R 1-39% L 40-59% FU 1 year  LHC 12/02/13 LM: patent. LAD: 95% proximal in-stent restenosis. Diffuse disease throughout the remainder of the vessel that in the left ventricular apex. Reflux into the mammary is noted to reflux into the diagonal graft is also noted. LCx: totally occluded. 40% stenosis proximal to the first obtuse marginal; the distal circumflex is totally occluded. RCA: totally occluded distally. SVG to OM 1 and OM 2 is widely patent. SVG to diagonal #1 is widely patent. SVG to PDA is widely patent. LIMA to LAD is widely patent EF: 60%.  Echo 03/06/12 EF 55% to 60%. Grade 2 diastolic dysfunction, MAC, trivial MR, mild LAE, normal RVSF, mild RAE, PASP 39 mmHg  Myoview 05/2005 Normal study, EF 63%  ASSESSMENT:    1. Coronary artery disease involving native coronary artery of native heart without angina pectoris   2. Chronic diastolic heart failure (HCC)   3. Essential hypertension   4. Hyperlipidemia   5. Bilateral carotid artery disease (HCC)   6. CKD (chronic kidney disease), stage 2 (mild)   7. S/P placement of cardiac pacemaker    PLAN:    In order of problems listed above:  1. CAD - s/p CABG. LHC with patent grafts in 2015.  angina. Continue ASA, Plavix,  beta-blocker, statin.   2. Chronic diastolic CHF - Volume stable.  Continue current Rx.   3. Essential hypertension -   4. Hyperlipidemia - Continue statin.  5. Carotid stenosis, bilateral - Overdue for carotid US. These are pending.    6. CKD - Recent creatinine stable.   7. S/p PPM - FU with EP as planned.   Medication Adjustments/Labs and Tests Ordered: Current medicines are reviewed at length with the patient  today.  Concerns regarding medicines are outlined above.  Medication changes, Labs and Tests ordered today are outlined in the Patient Instructions noted below. There are no Patient Instructions on file for this visit. Signed, Tereso Newcomer, PA-C  10/26/2015 10:17 AM    [facility] [address] [place], [place], [place]  [address] Phone: [phone]; Fax: [phone]     This encounter was created in error - please disregard.

## 2015-10-26 ENCOUNTER — Encounter (INDEPENDENT_AMBULATORY_CARE_PROVIDER_SITE_OTHER): Payer: Medicare Other | Admitting: Physician Assistant

## 2015-10-26 ENCOUNTER — Encounter: Payer: Self-pay | Admitting: Physician Assistant

## 2015-10-28 ENCOUNTER — Encounter: Payer: Self-pay | Admitting: Physician Assistant

## 2015-11-22 ENCOUNTER — Encounter (HOSPITAL_COMMUNITY): Payer: Self-pay | Admitting: *Deleted

## 2015-11-22 ENCOUNTER — Observation Stay (HOSPITAL_COMMUNITY)
Admission: EM | Admit: 2015-11-22 | Discharge: 2015-11-24 | Disposition: A | Discharge status: Home / Self Care | Payer: Medicare Other | Attending: Internal Medicine | Admitting: Internal Medicine

## 2015-11-22 DIAGNOSIS — Z79899 Other long term (current) drug therapy: Secondary | ICD-10-CM | POA: Insufficient documentation

## 2015-11-22 DIAGNOSIS — Z66 Do not resuscitate: Secondary | ICD-10-CM | POA: Insufficient documentation

## 2015-11-22 DIAGNOSIS — K219 Gastro-esophageal reflux disease without esophagitis: Secondary | ICD-10-CM | POA: Insufficient documentation

## 2015-11-22 DIAGNOSIS — E785 Hyperlipidemia, unspecified: Secondary | ICD-10-CM | POA: Insufficient documentation

## 2015-11-22 DIAGNOSIS — I5032 Chronic diastolic (congestive) heart failure: Secondary | ICD-10-CM | POA: Diagnosis present

## 2015-11-22 DIAGNOSIS — D649 Anemia, unspecified: Secondary | ICD-10-CM | POA: Diagnosis not present

## 2015-11-22 DIAGNOSIS — IMO0001 Reserved for inherently not codable concepts without codable children: Secondary | ICD-10-CM | POA: Diagnosis present

## 2015-11-22 DIAGNOSIS — R0602 Shortness of breath: Secondary | ICD-10-CM | POA: Diagnosis not present

## 2015-11-22 DIAGNOSIS — Z7902 Long term (current) use of antithrombotics/antiplatelets: Secondary | ICD-10-CM | POA: Diagnosis not present

## 2015-11-22 DIAGNOSIS — I10 Essential (primary) hypertension: Secondary | ICD-10-CM | POA: Diagnosis present

## 2015-11-22 DIAGNOSIS — I200 Unstable angina: Secondary | ICD-10-CM | POA: Diagnosis not present

## 2015-11-22 DIAGNOSIS — Z950 Presence of cardiac pacemaker: Secondary | ICD-10-CM | POA: Insufficient documentation

## 2015-11-22 DIAGNOSIS — Z8249 Family history of ischemic heart disease and other diseases of the circulatory system: Secondary | ICD-10-CM | POA: Insufficient documentation

## 2015-11-22 DIAGNOSIS — Z87891 Personal history of nicotine dependence: Secondary | ICD-10-CM | POA: Insufficient documentation

## 2015-11-22 DIAGNOSIS — N182 Chronic kidney disease, stage 2 (mild): Secondary | ICD-10-CM | POA: Diagnosis present

## 2015-11-22 DIAGNOSIS — E1122 Type 2 diabetes mellitus with diabetic chronic kidney disease: Secondary | ICD-10-CM

## 2015-11-22 DIAGNOSIS — I130 Hypertensive heart and chronic kidney disease with heart failure and stage 1 through stage 4 chronic kidney disease, or unspecified chronic kidney disease: Secondary | ICD-10-CM | POA: Insufficient documentation

## 2015-11-22 DIAGNOSIS — Z955 Presence of coronary angioplasty implant and graft: Secondary | ICD-10-CM | POA: Insufficient documentation

## 2015-11-22 DIAGNOSIS — I5033 Acute on chronic diastolic (congestive) heart failure: Secondary | ICD-10-CM | POA: Insufficient documentation

## 2015-11-22 DIAGNOSIS — Z794 Long term (current) use of insulin: Secondary | ICD-10-CM | POA: Diagnosis not present

## 2015-11-22 DIAGNOSIS — Z7982 Long term (current) use of aspirin: Secondary | ICD-10-CM | POA: Insufficient documentation

## 2015-11-22 DIAGNOSIS — I252 Old myocardial infarction: Secondary | ICD-10-CM | POA: Diagnosis not present

## 2015-11-22 DIAGNOSIS — E039 Hypothyroidism, unspecified: Secondary | ICD-10-CM | POA: Insufficient documentation

## 2015-11-22 DIAGNOSIS — R079 Chest pain, unspecified: Secondary | ICD-10-CM

## 2015-11-22 DIAGNOSIS — Z880 Allergy status to penicillin: Secondary | ICD-10-CM | POA: Insufficient documentation

## 2015-11-22 DIAGNOSIS — I495 Sick sinus syndrome: Secondary | ICD-10-CM | POA: Insufficient documentation

## 2015-11-22 DIAGNOSIS — I25110 Atherosclerotic heart disease of native coronary artery with unstable angina pectoris: Principal | ICD-10-CM | POA: Insufficient documentation

## 2015-11-22 DIAGNOSIS — N183 Chronic kidney disease, stage 3 (moderate): Secondary | ICD-10-CM | POA: Diagnosis not present

## 2015-11-22 DIAGNOSIS — E1143 Type 2 diabetes mellitus with diabetic autonomic (poly)neuropathy: Secondary | ICD-10-CM | POA: Diagnosis not present

## 2015-11-22 DIAGNOSIS — Z6835 Body mass index (BMI) 35.0-35.9, adult: Secondary | ICD-10-CM | POA: Insufficient documentation

## 2015-11-22 DIAGNOSIS — I5030 Unspecified diastolic (congestive) heart failure: Secondary | ICD-10-CM | POA: Diagnosis present

## 2015-11-22 DIAGNOSIS — E118 Type 2 diabetes mellitus with unspecified complications: Secondary | ICD-10-CM

## 2015-11-22 HISTORY — DX: Chronic diastolic (congestive) heart failure: I50.32

## 2015-11-22 LAB — CBC
HCT: 35.5 % — ABNORMAL LOW (ref 36.0–46.0)
HEMOGLOBIN: 11.3 g/dL — AB (ref 12.0–15.0)
MCH: 30.1 pg (ref 26.0–34.0)
MCHC: 31.8 g/dL (ref 30.0–36.0)
MCV: 94.7 fL (ref 78.0–100.0)
Platelets: 215 10*3/uL (ref 150–400)
RBC: 3.75 MIL/uL — AB (ref 3.87–5.11)
RDW: 13.5 % (ref 11.5–15.5)
WBC: 16.2 10*3/uL — ABNORMAL HIGH (ref 4.0–10.5)

## 2015-11-22 NOTE — ED Provider Notes (Signed)
[facility]-EMERGENCY DEPT Provider Note   CSN: [id number] Arrival date & time: 11/22/15  2236  First Provider Contact:  First MD Initiated Contact with Patient 11/22/15 2359     By signing my name below, I, Linna Darner, attest that this documentation has been prepared under the direction and in the presence of physician practitioner, Charlynne Pander, MD. Electronically Signed: Linna Darner, Scribe. 11/23/2015. 12:00 AM.  History   Chief Complaint Chief Complaint  Patient presents with  . Chest Pain    The history is provided by the patient. No language interpreter was used.     HPI Comments: [name] is a 82 y.o. female with extensive PMHx, including CAD, HTN, HLD, DM, and CKD stage II who presents to the Emergency Department complaining of sudden onset, constant, gradually improving, chest tightness for the last 6 hours. Pt notes h/o cardiac stent placement and reports she wears a pacemaker. She states her chest tightness is non-radiating. Pt notes she took nitroglycerin x2 with some relief of her chest tightness. She states she took her blood pressure medication twice yesterday and is supposed to take it x3 daily. She denies using aspirin today. Pt denies chest pain or any other associated symptoms. Per chart review, pt's last cardiac catheterization was in 2015 which revealed diffuse disease; cardiology wants to do medical management.  Cardiologist: Dr. Excell Seltzer  Past Medical History:  Diagnosis Date  . Allergic rhinitis   . Anemia   . Anxiety   . Barrett esophagus   . CAD (coronary artery disease) 2009   a. Multivessel s/p PCI w/DES 2009 // b. s/p CABG 2011  //  c. LHC 8/15: pLAD 95 ISR, LCx 100, pOM1 40, dRCA 100, S-OM1/OM2 ok, S-D1 ok, S-PDA ok, L-LAD ok, EF 60%  . Carotid artery disease (HCC)    a. Carotid US 9/15: RICA 1-39%; LICA 40-59% >> FU 1 year  . CKD (chronic kidney disease), stage II    GFR 60-89 ml/min  . Depression   . Disc disease, degenerative, cervical    . Diverticulosis   . Gastroparesis   . GERD (gastroesophageal reflux disease)   . Gout   . H/O hiatal hernia   . Helicobacter pylori gastritis   . History of echocardiogram    a. Echo 11/13: EF 55% to 60%. Grade 2 diastolic dysfunction, MAC, trivial MR, mild LAE, normal RVSF, mild RAE, PASP 39 mmHg  //  b. Echo 4/17: EF 55-60%, normal wall motion, trivial AI, MAC, moderate LAE, mild RVE, PASP 35 mmHg  . History of thrombocytopenia   . HTN (hypertension)   . Hyperlipidemia   . Hypothyroidism   . LBP (low back pain)    Lumbar disc disease/lumbar spinal stenosis  . Morbid obesity (HCC)   . Myocardial infarction (HCC)   . Osteoarthritis   . Osteopenia   . PVD (peripheral vascular disease) (HCC)   . Sick sinus syndrome [facility])    MDT Dual-chamber PPM implant 02/2012  . Type II or unspecified type diabetes mellitus without mention of complication, not stated as uncontrolled     Patient Active Problem List   Diagnosis Date Noted  . Unstable angina (HCC) 11/29/2013  . Type I (juvenile type) diabetes mellitus with renal manifestations, not stated as uncontrolled 04/27/2012  . DM (diabetes mellitus) (HCC) 04/27/2012  . Acute renal failure (HCC) 03/05/2012  . Diarrhea 03/05/2012  . Dehydration 03/05/2012  . CKD (chronic kidney disease) stage 2, GFR 60-89 ml/min 03/05/2012  . Carotid artery disease (  HCC) 08/11/2010  . Left shoulder pain 07/08/2010  . Preventative health care 07/08/2010  . HOARSENESS 06/04/2010  . PRURITUS 05/11/2010  . ANEMIA-NOS 04/02/2010  . Chronic diastolic heart failure (HCC) 02/11/2010  . Acute on chronic diastolic heart failure (HCC) 11/26/2009  . SINUS BRADYCARDIA 10/30/2009  . ALLERGIC RHINITIS 09/11/2009  . BACK PAIN 09/11/2009  . MUSCLE STRAIN, RIGHT BUTTOCK 09/11/2009  . SHINGLES 05/11/2009  . SHOULDER PAIN, LEFT 12/29/2008  . Proteinuria 12/12/2008  . Abdominal pain, unspecified site 09/24/2007  . Coronary atherosclerosis 05/03/2007  . CHEST PAIN  04/20/2007  . DIZZINESS 02/28/2007  . ANXIETY 02/15/2007  . GERD 02/15/2007  . Gastroparesis 02/15/2007  . DISC DISEASE, CERVICAL 02/15/2007  . DISC DISEASE, LUMBAR 02/15/2007  . SPINAL STENOSIS, LUMBAR 02/15/2007  . PERIPHERAL EDEMA 02/15/2007  . Personal History of Other Diseases of Digestive Disease 02/15/2007  . HYPERLIPIDEMIA 01/01/2007  . GOUT 01/01/2007  . Morbid obesity (HCC) 01/01/2007  . DEPRESSION 01/01/2007  . PERIPHERAL VASCULAR DISEASE 01/01/2007  . DIVERTICULOSIS, COLON 01/01/2007  . OSTEOARTHRITIS 01/01/2007  . LOW BACK PAIN 01/01/2007  . OSTEOPENIA 01/01/2007  . Essential hypertension 10/26/2006    Past Surgical History:  Procedure Laterality Date  . ABDOMINAL HYSTERECTOMY    . CARDIAC CATHETERIZATION     2011  DR COOPER (APPT NEXT WEEK)  . CHOLECYSTECTOMY    . CORONARY ARTERY BYPASS GRAFT  2011   LIMA-LAD, SVG-DIAG, SVG-OM1-OM2, SVG-PDA  . CORONARY STENT PLACEMENT     Drug-eluting stent to the left anterior descending, circumflex and right coronary artery in Jan 2009  . EYE SURGERY     BIL CATARACT REMOVAL 06/2010  . LEFT HEART CATHETERIZATION WITH CORONARY ANGIOGRAM N/A 12/02/2013   Procedure: LEFT HEART CATHETERIZATION WITH CORONARY ANGIOGRAM;  Surgeon: Lesleigh Noe, MD;  Location: [facility] CATH LAB;  Service: Cardiovascular;  Laterality: N/A;  . OVARIAN CYST REMOVAL    . PACEMAKER INSERTION  03/06/12   MDT Adapta L implanted by Dr Johney Frame for SSS  . PERMANENT PACEMAKER INSERTION N/A 03/06/2012   Procedure: PERMANENT PACEMAKER INSERTION;  Surgeon: Hillis Range, MD;  Location: [facility] CATH LAB;  Service: Cardiovascular;  Laterality: N/A;  . SHOULDER ARTHROSCOPY  06/16/2011   Procedure: ARTHROSCOPY SHOULDER;  Surgeon: Kennieth Rad, MD;  Location: [facility] OR;  Service: Orthopedics;  Laterality: Left;  LEFT SHOULDER ARTHROSCOPY ACROMIALPLASTY, POSSIBLE MINI OPEN CUFF REPAIR   . TUBAL LIGATION      OB History    No data available       Home Medications    Prior  to Admission medications   Medication Sig Start Date End Date Taking? Authorizing Provider  acetaminophen (TYLENOL) 500 MG tablet Take 500 mg by mouth 2 (two) times daily as needed for pain.    Yes Historical Provider, MD  allopurinol (ZYLOPRIM) 100 MG tablet Take 100 mg by mouth daily.   Yes Historical Provider, MD  amLODipine (NORVASC) 10 MG tablet Take 1 tablet (10 mg total) by mouth at bedtime. 07/13/15  Yes Beatrice Lecher, PA-C  aspirin 81 MG tablet Take 81 mg by mouth daily.   Yes Historical Provider, MD  atorvastatin (LIPITOR) 20 MG tablet Take 1 tablet (20 mg total) by mouth daily. 01/26/15  Yes Tonny Bollman, MD  azelastine (OPTIVAR) 0.05 % ophthalmic solution Place 1 drop into both eyes 2 (two) times daily. 10/26/15  Yes Historical Provider, MD  carvedilol (COREG) 3.125 MG tablet Take 1 tablet (3.125 mg total) by mouth 2 (two) times daily.  04/16/15  Yes Tonny BollmanMichael Cooper, MD  chlorthalidone (HYGROTON) 25 MG tablet Take 25 mg by mouth daily.  07/06/15  Yes Historical Provider, MD  clopidogrel (PLAVIX) 75 MG tablet Take 1 tablet by mouth  daily 04/16/15  Yes Tonny BollmanMichael Cooper, MD  colchicine 0.6 MG tablet Take 0.6 mg by mouth daily as needed (gout).    Yes Historical Provider, MD  diclofenac (VOLTAREN) 75 MG EC tablet Take 75 mg by mouth 2 (two) times daily.  01/21/13  Yes Historical Provider, MD  furosemide (LASIX) 40 MG tablet Take 40 mg by mouth daily. Reported on 08/24/2015   Yes Historical Provider, MD  HUMULIN N 100 UNIT/ML injection Inject 10-45 Units into the skin 2 (two) times daily before a meal. 45 units in the morning and 10 units at bedtime 07/06/15  Yes Historical Provider, MD  hydrALAZINE (APRESOLINE) 25 MG tablet Take 25 mg by mouth 3 (three) times daily.  07/06/15  Yes Historical Provider, MD  iron polysaccharides (NIFEREX) 150 MG capsule Take 150 mg by mouth daily.     Yes Historical Provider, MD  levothyroxine (SYNTHROID, LEVOTHROID) 100 MCG tablet Take 1 tablet (100 mcg total) by mouth  daily. 03/13/12  Yes Romero BellingSean Ellison, MD  losartan (COZAAR) 100 MG tablet Take 1 tablet (100 mg total) by mouth daily. 08/01/12  Yes Tonny BollmanMichael Cooper, MD  nitroGLYCERIN (NITROSTAT) 0.4 MG SL tablet Place 1 tablet (0.4 mg total) under the tongue every 5 (five) minutes as needed for chest pain. 10/30/12  Yes Tonny BollmanMichael Cooper, MD  pioglitazone (ACTOS) 15 MG tablet Take 15 mg by mouth daily.  07/06/15  Yes Historical Provider, MD  ONE TOUCH ULTRA TEST test strip 1 each by Other route daily as needed (blood sugar).  07/10/13   Historical Provider, MD    Family History Family History  Problem Relation Age of Onset  . Diabetes Mother   . Hypertension Mother   . Heart attack Mother   . Stroke Father   . Coronary artery disease Other     Social History Social History  Substance Use Topics  . Smoking status: Former Games developermoker  . Smokeless tobacco: Never Used     Comment: quit 30 yrs ago  . Alcohol use No     Allergies   Ciprofloxacin; Codeine; Hydrocodone; Penicillins; Shellfish allergy; Diltiazem hcl; Sulfonamide derivatives; Lovastatin; and Metformin   Review of Systems Review of Systems  Respiratory: Positive for chest tightness.   Cardiovascular: Negative for chest pain.  All other systems reviewed and are negative.   Physical Exam Updated Vital Signs BP 176/66   Pulse (!) 59   Temp 98.6 F (37 C)   Resp 17   Ht 5\' 5"  (1.651 m)   Wt 228 lb 1 oz (103.4 kg)   SpO2 100%   BMI 37.95 kg/m   Physical Exam  Constitutional: She is oriented to person, place, and time. She appears well-developed and well-nourished. No distress.  HENT:  Head: Normocephalic and atraumatic.  Eyes: Conjunctivae and EOM are normal.  Neck: Neck supple. No tracheal deviation present.  Cardiovascular: Normal rate and normal heart sounds.   Pulmonary/Chest: Effort normal. No respiratory distress.  Lungs are clear.  Musculoskeletal: Normal range of motion.  Neurological: She is alert and oriented to person, place,  and time.  Skin: Skin is warm and dry.  Psychiatric: She has a normal mood and affect. Her behavior is normal.  Nursing note and vitals reviewed.    ED Treatments / Results  Labs (all  labs ordered are listed, but only abnormal results are displayed) Labs Reviewed  BASIC METABOLIC PANEL - Abnormal; Notable for the following:       Result Value   Sodium 134 (*)    CO2 21 (*)    BUN 24 (*)    Creatinine, Ser 1.13 (*)    GFR calc non Af Amer 44 (*)    GFR calc Af Amer 51 (*)    All other components within normal limits  CBC - Abnormal; Notable for the following:    WBC 16.2 (*)    RBC 3.75 (*)    Hemoglobin 11.3 (*)    HCT 35.5 (*)    All other components within normal limits  TROPONIN I - Abnormal; Notable for the following:    Troponin I 0.03 (*)    All other components within normal limits  BRAIN NATRIURETIC PEPTIDE    EKG  EKG Interpretation  Date/Time:  Sunday November 22 2015 22:50:06 EDT Ventricular Rate:  68 PR Interval:  164 QRS Duration: 70 QT Interval:  388 QTC Calculation: 412 R Axis:   53 Text Interpretation:  Atrial-paced rhythm Septal infarct , age undetermined T wave abnormality, consider inferior ischemia T wave abnormality, consider anterolateral ischemia Abnormal ECG Similar to Nov 30 2013 Confirmed by Wilkie Aye  MD, COURTNEY ([id number]) on 11/22/2015 10:56:30 PM       Radiology Dg Chest 2 View  Result Date: 11/23/2015 CLINICAL DATA:  Chest tightness for 2 hours. Irregular heart beat. Shortness of breath, nausea, and dizziness. EXAM: CHEST  2 VIEW COMPARISON:  11/29/2013 FINDINGS: Postoperative changes in the mediastinum. Cardiac pacemaker. Shallow inspiration. Heart size and pulmonary vascularity are normal. No focal airspace disease or consolidation in the lungs. No blunting of costophrenic angles. No pneumothorax. Degenerative changes in the spine. IMPRESSION: No active cardiopulmonary disease. Electronically Signed   By: Burman Nieves M.D.   On:  11/23/2015 01:10    Procedures Procedures (including critical care time)  DIAGNOSTIC STUDIES: Oxygen Saturation is 100% on RA, normal by my interpretation.    COORDINATION OF CARE: 12:00 AM Discussed treatment plan with pt at bedside and pt agreed to plan.  Medications Ordered in ED Medications  aspirin tablet 325 mg (not administered)  amLODipine (NORVASC) tablet 10 mg (10 mg Oral Given 11/23/15 0127)  hydrALAZINE (APRESOLINE) tablet 25 mg (25 mg Oral Given 11/23/15 0127)     Initial Impression / Assessment and Plan / ED Course  I have reviewed the triage vital signs and the nursing notes.  Pertinent labs & imaging results that were available during my care of the patient were reviewed by me and considered in my medical decision making (see chart for details).  Clinical Course    KIAIRA POINTER is a 82 y.o. female here with chest pain. Has significant CAD with stents. Pain free now after nitro. Will get labs, trop. Likely admit.   2:35 AM Trop positive 0.03. Pain free. I tried to call cardiology multiple times but received no call back. Called hospitalist to admit. Cardiology to consult in the morning. Held heparin for now. Given ASA in the ED. No pain so held nitro.   I personally performed the services described in this documentation, which was scribed in my presence. The recorded information has been reviewed and is accurate.   Final Clinical Impressions(s) / ED Diagnoses   Final diagnoses:  None    New Prescriptions New Prescriptions   No medications on file     Onalee Hua  Achilles Dunk, MD 11/23/15 [phone]

## 2015-11-22 NOTE — ED Triage Notes (Signed)
The pt is c/o chest tightness for 2 hours with an irregularf heart beat  With sob nausea and dizziness

## 2015-11-23 ENCOUNTER — Encounter (HOSPITAL_COMMUNITY): Payer: Self-pay | Admitting: Internal Medicine

## 2015-11-23 ENCOUNTER — Emergency Department (HOSPITAL_COMMUNITY): Payer: Medicare Other

## 2015-11-23 DIAGNOSIS — I200 Unstable angina: Secondary | ICD-10-CM | POA: Diagnosis not present

## 2015-11-23 DIAGNOSIS — R079 Chest pain, unspecified: Secondary | ICD-10-CM | POA: Diagnosis present

## 2015-11-23 DIAGNOSIS — N182 Chronic kidney disease, stage 2 (mild): Secondary | ICD-10-CM

## 2015-11-23 DIAGNOSIS — R0602 Shortness of breath: Secondary | ICD-10-CM | POA: Diagnosis not present

## 2015-11-23 LAB — GLUCOSE, CAPILLARY
GLUCOSE-CAPILLARY: 91 mg/dL (ref 65–99)
GLUCOSE-CAPILLARY: 226 mg/dL — AB (ref 65–99)
Glucose-Capillary: 109 mg/dL — ABNORMAL HIGH (ref 65–99)
Glucose-Capillary: 107 mg/dL — ABNORMAL HIGH (ref 65–99)
Glucose-Capillary: 116 mg/dL — ABNORMAL HIGH (ref 65–99)

## 2015-11-23 LAB — TROPONIN I
Troponin I: 0.03 ng/mL (ref <0.03)
Troponin I: 0.03 ng/mL (ref <0.03)
Troponin I: <0.03 ng/mL (ref <0.03)

## 2015-11-23 LAB — URINALYSIS, ROUTINE W REFLEX MICROSCOPIC
Bilirubin Urine: NEGATIVE
Glucose, UA: NEGATIVE mg/dL
Hgb urine dipstick: NEGATIVE
KETONES UR: NEGATIVE mg/dL
NITRITE: NEGATIVE
PH: 5.5 (ref 5.0–8.0)
Protein, ur: NEGATIVE mg/dL
SPECIFIC GRAVITY, URINE: 1.016 (ref 1.005–1.030)

## 2015-11-23 LAB — BASIC METABOLIC PANEL
ANION GAP: 7 (ref 5–15)
BUN: 24 mg/dL — ABNORMAL HIGH (ref 6–20)
CALCIUM: 9.1 mg/dL (ref 8.9–10.3)
CO2: 21 mmol/L — AB (ref 22–32)
Chloride: 106 mmol/L (ref 101–111)
Creatinine, Ser: 1.13 mg/dL — ABNORMAL HIGH (ref 0.44–1.00)
GFR calc non Af Amer: 44 mL/min — ABNORMAL LOW (ref >60)
GFR, EST AFRICAN AMERICAN: 51 mL/min — AB (ref >60)
Glucose, Bld: 93 mg/dL (ref 65–99)
POTASSIUM: 3.5 mmol/L (ref 3.5–5.1)
Sodium: 134 mmol/L — ABNORMAL LOW (ref 135–145)

## 2015-11-23 LAB — URINE MICROSCOPIC-ADD ON

## 2015-11-23 LAB — BRAIN NATRIURETIC PEPTIDE: B NATRIURETIC PEPTIDE 5: 75.7 pg/mL (ref 0.0–100.0)

## 2015-11-23 MED ORDER — HYDRALAZINE HCL 20 MG/ML IJ SOLN: 10.0000 mg | INTRAMUSCULAR | Status: DC | PRN

## 2015-11-23 MED ORDER — POLYSACCHARIDE IRON COMPLEX 150 MG PO CAPS
150.0000 mg | ORAL_CAPSULE | Freq: Every day | ORAL | Status: DC
  Administered 2015-11-23 – 2015-11-24 (×2): 150 mg via ORAL
  Filled 2015-11-23 (×2): qty 1

## 2015-11-23 MED ORDER — ASPIRIN 325 MG PO TABS
325.0000 mg | ORAL_TABLET | Freq: Once | ORAL | Status: AC
  Administered 2015-11-23: 325 mg via ORAL
  Filled 2015-11-23: qty 1

## 2015-11-23 MED ORDER — ENOXAPARIN SODIUM 40 MG/0.4ML ~~LOC~~ SOLN
40.0000 mg | SUBCUTANEOUS | Status: DC
  Administered 2015-11-23 – 2015-11-24 (×2): 40 mg via SUBCUTANEOUS
  Filled 2015-11-23 (×2): qty 0.4

## 2015-11-23 MED ORDER — AMLODIPINE BESYLATE 10 MG PO TABS
10.0000 mg | ORAL_TABLET | Freq: Every day | ORAL | Status: DC
  Administered 2015-11-23: 10 mg via ORAL
  Filled 2015-11-23: qty 1

## 2015-11-23 MED ORDER — PIOGLITAZONE HCL 15 MG PO TABS
15.0000 mg | ORAL_TABLET | Freq: Every day | ORAL | Status: DC
Start: 2015-11-23 — End: 2015-11-24
  Administered 2015-11-23 – 2015-11-24 (×2): 15 mg via ORAL
  Filled 2015-11-23 (×2): qty 1

## 2015-11-23 MED ORDER — HYDRALAZINE HCL 25 MG PO TABS
25.0000 mg | ORAL_TABLET | Freq: Once | ORAL | Status: AC
  Administered 2015-11-23: 25 mg via ORAL
  Filled 2015-11-23: qty 1

## 2015-11-23 MED ORDER — KETOTIFEN FUMARATE 0.025 % OP SOLN
1.0000 [drp] | Freq: Two times a day (BID) | OPHTHALMIC | Status: DC
  Administered 2015-11-23 – 2015-11-24 (×3): 1 [drp] via OPHTHALMIC
  Filled 2015-11-23: qty 5

## 2015-11-23 MED ORDER — ALLOPURINOL 100 MG PO TABS
100.0000 mg | ORAL_TABLET | Freq: Every day | ORAL | Status: DC
  Administered 2015-11-23 – 2015-11-24 (×2): 100 mg via ORAL
  Filled 2015-11-23 (×2): qty 1

## 2015-11-23 MED ORDER — CARVEDILOL 3.125 MG PO TABS
3.1250 mg | ORAL_TABLET | Freq: Two times a day (BID) | ORAL | Status: DC
  Administered 2015-11-23 – 2015-11-24 (×3): 3.125 mg via ORAL
  Filled 2015-11-23 (×3): qty 1

## 2015-11-23 MED ORDER — HYDRALAZINE HCL 25 MG PO TABS
25.0000 mg | ORAL_TABLET | Freq: Three times a day (TID) | ORAL | Status: DC
  Administered 2015-11-23 – 2015-11-24 (×5): 25 mg via ORAL
  Filled 2015-11-23 (×5): qty 1

## 2015-11-23 MED ORDER — INSULIN ASPART 100 UNIT/ML ~~LOC~~ SOLN
0.0000 [IU] | SUBCUTANEOUS | Status: DC
  Administered 2015-11-23: 3 [IU] via SUBCUTANEOUS
  Administered 2015-11-23: 2 [IU] via SUBCUTANEOUS
  Administered 2015-11-24: 1 [IU] via SUBCUTANEOUS

## 2015-11-23 MED ORDER — LEVOTHYROXINE SODIUM 100 MCG PO TABS
100.0000 ug | ORAL_TABLET | Freq: Every day | ORAL | Status: DC
  Administered 2015-11-23 – 2015-11-24 (×2): 100 ug via ORAL
  Filled 2015-11-23 (×3): qty 1

## 2015-11-23 MED ORDER — CHLORTHALIDONE 25 MG PO TABS
25.0000 mg | ORAL_TABLET | Freq: Every day | ORAL | Status: DC
  Administered 2015-11-23 – 2015-11-24 (×2): 25 mg via ORAL
  Filled 2015-11-23 (×2): qty 1

## 2015-11-23 MED ORDER — ONDANSETRON HCL 4 MG/2ML IJ SOLN: 4.0000 mg | Freq: Four times a day (QID) | INTRAMUSCULAR | Status: DC | PRN

## 2015-11-23 MED ORDER — AMLODIPINE BESYLATE 5 MG PO TABS
10.0000 mg | ORAL_TABLET | Freq: Once | ORAL | Status: AC
  Administered 2015-11-23: 10 mg via ORAL
  Filled 2015-11-23: qty 2

## 2015-11-23 MED ORDER — CLOPIDOGREL BISULFATE 75 MG PO TABS
75.0000 mg | ORAL_TABLET | Freq: Every day | ORAL | Status: DC
Start: 2015-11-23 — End: 2015-11-24
  Administered 2015-11-23 – 2015-11-24 (×2): 75 mg via ORAL
  Filled 2015-11-23 (×2): qty 1

## 2015-11-23 MED ORDER — INSULIN NPH (HUMAN) (ISOPHANE) 100 UNIT/ML ~~LOC~~ SUSP
10.0000 [IU] | Freq: Two times a day (BID) | SUBCUTANEOUS | Status: DC
  Administered 2015-11-23 – 2015-11-24 (×2): 10 [IU] via SUBCUTANEOUS
  Filled 2015-11-23: qty 10

## 2015-11-23 MED ORDER — NITROGLYCERIN 0.4 MG SL SUBL: 0.4000 mg | SUBLINGUAL_TABLET | SUBLINGUAL | Status: DC | PRN

## 2015-11-23 MED ORDER — ATORVASTATIN CALCIUM 20 MG PO TABS
20.0000 mg | ORAL_TABLET | Freq: Every day | ORAL | Status: DC
  Administered 2015-11-23: 20 mg via ORAL
  Filled 2015-11-23: qty 1

## 2015-11-23 MED ORDER — COLCHICINE 0.6 MG PO TABS: 0.6000 mg | ORAL_TABLET | Freq: Every day | ORAL | Status: DC | PRN

## 2015-11-23 MED ORDER — NITROGLYCERIN 0.4 MG/HR TD PT24
0.4000 mg | MEDICATED_PATCH | Freq: Every day | TRANSDERMAL | Status: DC
  Administered 2015-11-23 – 2015-11-24 (×2): 0.4 mg via TRANSDERMAL
  Filled 2015-11-23 (×2): qty 1

## 2015-11-23 MED ORDER — ASPIRIN 81 MG PO CHEW
81.0000 mg | CHEWABLE_TABLET | Freq: Every day | ORAL | Status: DC
  Administered 2015-11-23 – 2015-11-24 (×2): 81 mg via ORAL
  Filled 2015-11-23 (×2): qty 1

## 2015-11-23 MED ORDER — ACETAMINOPHEN 325 MG PO TABS: 650.0000 mg | ORAL_TABLET | ORAL | Status: DC | PRN

## 2015-11-23 NOTE — Progress Notes (Signed)
Patient admitted after midnight.  C/o chest tightness- long cardiac history.  Patient also had "chills" no sign of UTI/PNA or other infection.  Cards consult-- only mild increase in troponin, now back to normal  Marlin CanaryJessica Mersadez Linden DO

## 2015-11-23 NOTE — ED Notes (Signed)
Pt in xray, they will bring pt to 19 after xray.

## 2015-11-23 NOTE — Consult Note (Signed)
Date: 11/23/2015               Patient Name:  [name] MRN: [id number]  DOB: 1932-09-01 Age / Sex: 82 y.o., female   PCP: Renaye Rakers, MD         Requesting Physician: Dr. Joseph Art, DO    Consulting Reason:  Chest Tightness     Chief Complaint: Chest tightness with chills.  History of Present Illness:  [name] is a 82 y.o. female with CAD status post PCI and CABG last cardiac cath in August 2015 which showed patent bypass graft presents to the ER because of chest tightness. Patient's symptoms started last evening while at home. Patient felt retrosternal pressure-like symptoms nonradiating , associated with nausea but no vomitus. Mild palpitations and chills. Her chest tightness improved after taking 2 doses of S/L nitroglycerine but continue to have chills. She denies any fever,congestion,SOB, cough,diarrhea or dysuria. In the ER EKG was showing atrial paced rhythm and cardiac markers are negative and given the patient's history, patient is being admitted for further management and workup.  Currently she is not having any chest pain or tightness. Still having some chills .  Meds: Current Facility-Administered Medications  Medication Dose Route Frequency Provider Last Rate Last Dose  . acetaminophen (TYLENOL) tablet 650 mg  650 mg Oral Q4H PRN Samantha Clos, MD      . allopurinol (ZYLOPRIM) tablet 100 mg  100 mg Oral Daily Samantha Clos, MD   100 mg at 11/23/15 1008  . amLODipine (NORVASC) tablet 10 mg  10 mg Oral QHS Samantha Clos, MD      . aspirin chewable tablet 81 mg  81 mg Oral Daily Samantha Clos, MD   81 mg at 11/23/15 1008  . atorvastatin (LIPITOR) tablet 20 mg  20 mg Oral q1800 Samantha Clos, MD      . carvedilol (COREG) tablet 3.125 mg  3.125 mg Oral BID WC Samantha Clos, MD   3.125 mg at 11/23/15 0835  . chlorthalidone (HYGROTON) tablet 25 mg  25 mg Oral Daily Samantha Clos, MD   25 mg at 11/23/15 1008  . clopidogrel  (PLAVIX) tablet 75 mg  75 mg Oral Daily Samantha Clos, MD   75 mg at 11/23/15 1008  . colchicine tablet 0.6 mg  0.6 mg Oral Daily PRN Samantha Clos, MD      . enoxaparin (LOVENOX) injection 40 mg  40 mg Subcutaneous Q24H Samantha Clos, MD   40 mg at 11/23/15 0835  . hydrALAZINE (APRESOLINE) injection 10 mg  10 mg Intravenous Q4H PRN Samantha Clos, MD      . hydrALAZINE (APRESOLINE) tablet 25 mg  25 mg Oral Q8H Samantha Clos, MD   25 mg at 11/23/15 1416  . insulin aspart (novoLOG) injection 0-9 Units  0-9 Units Subcutaneous Q4H Samantha Clos, MD      . insulin NPH Human (HUMULIN N,NOVOLIN N) injection 10 Units  10 Units Subcutaneous BID AC & HS Samantha Clos, MD   Stopped at 11/23/15 0840  . iron polysaccharides (NIFEREX) capsule 150 mg  150 mg Oral Daily Samantha Clos, MD   150 mg at 11/23/15 1008  . ketotifen (ZADITOR) 0.025 % ophthalmic solution 1 drop  1 drop Both Eyes BID Samantha Clos, MD   1 drop at 11/23/15 1013  . levothyroxine (SYNTHROID, LEVOTHROID) tablet 100 mcg  100 mcg Oral QAC breakfast Samantha  Demetra Shiner, MD   100 mcg at 11/23/15 0649  . nitroGLYCERIN (NITRODUR - Dosed in mg/24 hr) patch 0.4 mg  0.4 mg Transdermal Daily Samantha Clos, MD   0.4 mg at 11/23/15 1008  . nitroGLYCERIN (NITROSTAT) SL tablet 0.4 mg  0.4 mg Sublingual Q5 min PRN Samantha Clos, MD      . ondansetron [facility]) injection 4 mg  4 mg Intravenous Q6H PRN Samantha Clos, MD      . pioglitazone (ACTOS) tablet 15 mg  15 mg Oral QAC breakfast Samantha Clos, MD   15 mg at 11/23/15 8119    Allergies: Allergies as of 11/22/2015 - Review Complete 11/22/2015  Allergen Reaction Noted  . Ciprofloxacin Nausea And Vomiting 08/11/2010  . Codeine Other (See Comments) 06/02/2011  . Hydrocodone Other (See Comments) 06/10/2011  . Penicillins Hives 01/01/2007  . Shellfish allergy Hives 06/10/2011  . Diltiazem hcl Other (See Comments) 02/15/2007  .  Sulfonamide derivatives Nausea And Vomiting 01/01/2007  . Lovastatin Other (See Comments) 02/15/2007  . Metformin Other (See Comments) 02/15/2007   Past Medical History:  Diagnosis Date  . Allergic rhinitis   . Anemia   . Anxiety   . Barrett esophagus   . CAD (coronary artery disease) 2009   a. Multivessel s/p PCI w/DES 2009 // b. s/p CABG 2011  //  c. LHC 8/15: pLAD 95 ISR, LCx 100, pOM1 40, dRCA 100, S-OM1/OM2 ok, S-D1 ok, S-PDA ok, L-LAD ok, EF 60%  . Carotid artery disease (HCC)    a. Carotid US 9/15: RICA 1-39%; LICA 40-59% >> FU 1 year  . CKD (chronic kidney disease), stage II    GFR 60-89 ml/min  . Depression   . Disc disease, degenerative, cervical   . Diverticulosis   . Gastroparesis   . GERD (gastroesophageal reflux disease)   . Gout   . H/O hiatal hernia   . Helicobacter pylori gastritis   . History of echocardiogram    a. Echo 11/13: EF 55% to 60%. Grade 2 diastolic dysfunction, MAC, trivial MR, mild LAE, normal RVSF, mild RAE, PASP 39 mmHg  //  b. Echo 4/17: EF 55-60%, normal wall motion, trivial AI, MAC, moderate LAE, mild RVE, PASP 35 mmHg  . History of thrombocytopenia   . HTN (hypertension)   . Hyperlipidemia   . Hypothyroidism   . LBP (low back pain)    Lumbar disc disease/lumbar spinal stenosis  . Morbid obesity (HCC)   . Myocardial infarction (HCC)   . Osteoarthritis   . Osteopenia   . PVD (peripheral vascular disease) (HCC)   . Sick sinus syndrome [facility])    MDT Dual-chamber PPM implant 02/2012  . Type II or unspecified type diabetes mellitus without mention of complication, not stated as uncontrolled    Past Surgical History:  Procedure Laterality Date  . ABDOMINAL HYSTERECTOMY    . CARDIAC CATHETERIZATION     2011  DR COOPER (APPT NEXT WEEK)  . CHOLECYSTECTOMY    . CORONARY ARTERY BYPASS GRAFT  2011   LIMA-LAD, SVG-DIAG, SVG-OM1-OM2, SVG-PDA  . CORONARY STENT PLACEMENT     Drug-eluting stent to the left anterior descending, circumflex and right  coronary artery in Jan 2009  . EYE SURGERY     BIL CATARACT REMOVAL 06/2010  . LEFT HEART CATHETERIZATION WITH CORONARY ANGIOGRAM N/A 12/02/2013   Procedure: LEFT HEART CATHETERIZATION WITH CORONARY ANGIOGRAM;  Surgeon: Lesleigh Noe, MD;  Location: [facility] CATH LAB;  Service: Cardiovascular;  Laterality:  N/A;  . OVARIAN CYST REMOVAL    . PACEMAKER INSERTION  03/06/12   MDT Adapta L implanted by Dr Johney FrameAllred for SSS  . PERMANENT PACEMAKER INSERTION N/A 03/06/2012   Procedure: PERMANENT PACEMAKER INSERTION;  Surgeon: Hillis RangeJames Allred, MD;  [facility];  Service: Cardiovascular;  Laterality: N/A;  . SHOULDER ARTHROSCOPY  06/16/2011   Procedure: ARTHROSCOPY SHOULDER;  Surgeon: Kennieth RadArthur F Carter, MD;  [facility];  Service: Orthopedics;  Laterality: Left;  LEFT SHOULDER ARTHROSCOPY ACROMIALPLASTY, POSSIBLE MINI OPEN CUFF REPAIR   . TUBAL LIGATION     Family History  Problem Relation Age of Onset  . Diabetes Mother   . Hypertension Mother   . Heart attack Mother   . Stroke Father   . Coronary artery disease Other    Social History   Social History  . Marital status: Widowed    Spouse name: N/A  . Number of children: N/A  . Years of education: N/A   Occupational History  . RETIRED LPN    Social History Main Topics  . Smoking status: Former Games developermoker  . Smokeless tobacco: Never Used     Comment: quit 30 yrs ago  . Alcohol use No  . Drug use: No  . Sexual activity: Not on file   Other Topics Concern  . Not on file   Social History Narrative   Widowed 2004.., Lives alone..Family history is negative for premature coronary artery disease. Mother died at age 80 with heart disease in her later years, father died at age 80 from a stroke.Marland Kitchen.She  has 8 siblings, none of whom have coronary artery disease.          Review of Systems: Pertinent items are noted in HPI.  Physical Exam: Blood pressure (!) 147/58, pulse 61, temperature 98 F (36.7 C), temperature source Oral, resp. rate  20, height 5\' 7"  (1.702 m), weight 224 lb 4.8 oz (101.7 kg), SpO2 98 %. BP (!) 147/58 (BP Location: Left Arm)   Pulse 61   Temp 98 F (36.7 C) (Oral)   Resp 20   Ht 5\' 7"  (1.702 m)   Wt 224 lb 4.8 oz (101.7 kg)   SpO2 98%   BMI 35.13 kg/m   General Appearance:    Alert, cooperative, no distress, appears stated age  Head:    Normocephalic, without obvious abnormality, atraumatic  Eyes:    PERRL, conjunctiva/corneas clear, EOM's intact, fundi    benign, both eyes     Nose:   Nares normal, septum midline, mucosa normal, no drainage    or sinus tenderness  Throat:   Lips, mucosa, and tongue normal;   Neck:   Supple, symmetrical, trachea midline, no adenopathy;    thyroid:  no enlargement/tenderness/nodules; no carotid   bruit or JVD  Back:     Symmetric, no curvature, ROM normal, no CVA tenderness  Lungs:     Clear to auscultation bilaterally, respirations unlabored  Chest Wall:    No tenderness or deformity   Heart:    Regular rate and rhythm, S1 and S2 normal, 2/4 systolic murmur,     Abdomen:     Soft, non-tender, bowel sounds active all four quadrants,    no masses, no organomegaly        Extremities:   Extremities normal, atraumatic, no cyanosis or edema  Pulses:   2+ and symmetric all extremities  Skin:   Skin color, texture, turgor normal, no rashes or lesions     Neurologic:  CNII-XII intact, normal strength, sensation and reflexes    throughout     Lab results: CBC    Component Value Date/Time   WBC 16.2 (H) 11/22/2015 2315   RBC 3.75 (L) 11/22/2015 2315   HGB 11.3 (L) 11/22/2015 2315   HCT 35.5 (L) 11/22/2015 2315   PLT 215 11/22/2015 2315   MCV 94.7 11/22/2015 2315   MCH 30.1 11/22/2015 2315   MCHC 31.8 11/22/2015 2315   RDW 13.5 11/22/2015 2315   LYMPHSABS 1.6 08/03/2014 0826   MONOABS 0.5 08/03/2014 0826   EOSABS 0.3 08/03/2014 0826   BASOSABS 0.0 08/03/2014 0826   CMP Latest Ref Rng & Units 11/22/2015 05/11/2015 08/03/2014  Glucose 65 - 99 mg/dL 93  45(W) 72  BUN 6 - 20 mg/dL 09(W) 11(B) 14(N)  Creatinine 0.44 - 1.00 mg/dL 8.29(F) 6.21(H) 0.86  Sodium 135 - 145 mmol/L 134(L) 138 137  Potassium 3.5 - 5.1 mmol/L 3.5 4.0 3.9  Chloride 101 - 111 mmol/L 106 105 103  CO2 22 - 32 mmol/L 21(L) 25 25  Calcium 8.9 - 10.3 mg/dL 9.1 9.8 9.2  Total Protein 6.1 - 8.1 g/dL - 6.8 -  Total Bilirubin 0.2 - 1.2 mg/dL - 0.5 -  Alkaline Phos 33 - 130 U/L - 72 -  AST 10 - 35 U/L - 23 -  ALT 6 - 29 U/L - 20 -   Urinalysis    Component Value Date/Time   COLORURINE YELLOW 11/23/2015 1159   APPEARANCEUR CLEAR 11/23/2015 1159   LABSPEC 1.016 11/23/2015 1159   PHURINE 5.5 11/23/2015 1159   GLUCOSEU NEGATIVE 11/23/2015 1159   GLUCOSEU 100 (?) 09/11/2009 0000   HGBUR NEGATIVE 11/23/2015 1159   HGBUR negative 05/11/2010 1330   BILIRUBINUR NEGATIVE 11/23/2015 1159   KETONESUR NEGATIVE 11/23/2015 1159   PROTEINUR NEGATIVE 11/23/2015 1159   UROBILINOGEN 0.2 08/03/2014 0937   NITRITE NEGATIVE 11/23/2015 1159   LEUKOCYTESUR TRACE (A) 11/23/2015 1159     Imaging results: Dg Chest 2 View  Result Date: 11/23/2015 CLINICAL DATA:  Chest tightness for 2 hours. Irregular heart beat. Shortness of breath, nausea, and dizziness. EXAM: CHEST  2 VIEW COMPARISON:  11/29/2013 FINDINGS: Postoperative changes in the mediastinum. Cardiac pacemaker. Shallow inspiration. Heart size and pulmonary vascularity are normal. No focal airspace disease or consolidation in the lungs. No blunting of costophrenic angles. No pneumothorax. Degenerative changes in the spine. IMPRESSION: No active cardiopulmonary disease. Electronically Signed   By: Burman Nieves M.D.   On: 11/23/2015 01:10    Other results: [id number] rhythm Septal infarct , age undetermined T wave abnormality, consider inferior ischemia T wave abnormality, consider anterolateral ischemia Abnormal ECG Similar to Nov 30 2013 Confirmed by Wilkie Aye MD, Toni Amend ([id number]) on 11/22/2015 10:56:30 PM    Assessment,  Plan, & Recommendations by Problem:    KHLOIE HAMADA is a 82 y.o. female with CAD status post PCI and CABG last cardiac cath in August 2015 which showed patent bypass graft presents to the ER because of chest tightness. Patient's symptoms started last evening while at home. Patient felt retrosternal pressure-like symptoms nonradiating , associated with nausea but no vomitus. Mild palpitations and chills. Her chest tightness improved after taking 2 doses of S/L nitroglycerine but continue to have chills.   Chest Pain: Considering her extensive cardiac history with chest tightness which responded to nitroglycerine, although she does not have any acute changes in her ECG and Trop. Is negative, Angina can not be ruled out. -Myoview tomorrow to rule out  any reversible ischemia. -Continue current home meds. ASA, Lipitor, Coreg, Plavix.  Leukocytosis: She C/O shaking chills although denies any fever, cough,congestion, dysuria, diarrhea. Her WBC was 16.2 today with UA showing only few leukocytes. DG chest is negative for any infiltrate. She is afebrile.Infection might be a possibility -Repeat CBC with differential tomorrow - Will need blood culture if trending up.  Hypertension: Monitor and continue home meds.Amlodipine, Hydralazine, hygroton.  DM: Seems stable, Continue with Insulin.   Dispo: Disposition is deferred at this time, awaiting improvement of current medical problems. Anticipated discharge in approximately 1-2day(s).   The patient does have a current PCP (Renaye RakersVeita Bland, MD) and [facility] after [facility].  The patient does not have transportation limitations that hinder transportation to clinic appointments.  Signed: Arnetha CourserSumayya Yaritsa Savarino, MD 11/23/2015, 3:10 PM

## 2015-11-23 NOTE — ED Notes (Signed)
Reported critical troponin to Dr Silverio LayYao.

## 2015-11-23 NOTE — H&P (Signed)
History and Physical    [name]:[id number] DOB: 1932-08-12 DOA: 11/22/2015  PCP: Geraldo Pitter, MD  Patient coming from: Home.  Chief Complaint: Chest pain.  [name] is a 82 y.o. female with CAD status post PCI and CABG last cardiac cath in August 2015 which showed patent bypass graft presents to the ER because of chest pain. Patient's symptoms started last evening while at home. Patient felt retrosternal pressure-like symptoms nonradiating no associated shortness of breath diaphoresis. Patient took a nitroglycerin sublingual despite which discomfort persisted and patient had to take another one 7 minutes later following which patient's symptoms resolved. In the ER EKG was showing atrial paced rhythm and cardiac markers are negative and given the patient's history, patient is being admitted for further management and workup. Presently patient is chest pain-free. Abdomen appears benign.  ED Course:  EKG chest x-ray and cardiac markers are negative.  Review of Systems: As per HPI, rest all negative.   Past Medical History:  Diagnosis Date  . Allergic rhinitis   . Anemia   . Anxiety   . Barrett esophagus   . CAD (coronary artery disease) 2009   a. Multivessel s/p PCI w/DES 2009 // b. s/p CABG 2011  //  c. LHC 8/15: pLAD 95 ISR, LCx 100, pOM1 40, dRCA 100, S-OM1/OM2 ok, S-D1 ok, S-PDA ok, L-LAD ok, EF 60%  . Carotid artery disease (HCC)    a. Carotid US 9/15: RICA 1-39%; LICA 40-59% >> FU 1 year  . CKD (chronic kidney disease), stage II    GFR 60-89 ml/min  . Depression   . Disc disease, degenerative, cervical   . Diverticulosis   . Gastroparesis   . GERD (gastroesophageal reflux disease)   . Gout   . H/O hiatal hernia   . Helicobacter pylori gastritis   . History of echocardiogram    a. Echo 11/13: EF 55% to 60%. Grade 2 diastolic dysfunction, MAC, trivial MR, mild LAE, normal RVSF, mild RAE, PASP 39 mmHg  //  b. Echo 4/17: EF 55-60%, normal wall motion,  trivial AI, MAC, moderate LAE, mild RVE, PASP 35 mmHg  . History of thrombocytopenia   . HTN (hypertension)   . Hyperlipidemia   . Hypothyroidism   . LBP (low back pain)    Lumbar disc disease/lumbar spinal stenosis  . Morbid obesity (HCC)   . Myocardial infarction (HCC)   . Osteoarthritis   . Osteopenia   . PVD (peripheral vascular disease) (HCC)   . Sick sinus syndrome [facility])    MDT Dual-chamber PPM implant 02/2012  . Type II or unspecified type diabetes mellitus without mention of complication, not stated as uncontrolled     Past Surgical History:  Procedure Laterality Date  . ABDOMINAL HYSTERECTOMY    . CARDIAC CATHETERIZATION     2011  DR COOPER (APPT NEXT WEEK)  . CHOLECYSTECTOMY    . CORONARY ARTERY BYPASS GRAFT  2011   LIMA-LAD, SVG-DIAG, SVG-OM1-OM2, SVG-PDA  . CORONARY STENT PLACEMENT     Drug-eluting stent to the left anterior descending, circumflex and right coronary artery in Jan 2009  . EYE SURGERY     BIL CATARACT REMOVAL 06/2010  . LEFT HEART CATHETERIZATION WITH CORONARY ANGIOGRAM N/A 12/02/2013   Procedure: LEFT HEART CATHETERIZATION WITH CORONARY ANGIOGRAM;  Surgeon: Lesleigh Noe, MD;  Location: [facility] C[facility] LAB;  Service: Cardiovascular;  Laterality: N/A;  . OVARIAN CYST REMOVAL    . PACEMAKER INSERTION  03/06/12  MDT Adapta L implanted by Dr Johney Frame for SSS  . PERMANENT PACEMAKER INSERTION N/A 03/06/2012   Procedure: PERMANENT PACEMAKER INSERTION;  Surgeon: Hillis Range, MD;  Location: [facility] CATH LAB;  Service: Cardiovascular;  Laterality: N/A;  . SHOULDER ARTHROSCOPY  06/16/2011   Procedure: ARTHROSCOPY SHOULDER;  Surgeon: Kennieth Rad, MD;  Location: [facility] OR;  Service: Orthopedics;  Laterality: Left;  LEFT SHOULDER ARTHROSCOPY ACROMIALPLASTY, POSSIBLE MINI OPEN CUFF REPAIR   . TUBAL LIGATION       reports that she has quit smoking. She has never used smokeless tobacco. She reports that she does not drink alcohol or use drugs.  Allergies  Allergen Reactions    . Ciprofloxacin Nausea And Vomiting    syncope  . Codeine Other (See Comments)    HALLUCINATIONS  . Hydrocodone Other (See Comments)    Makes her pass out  . Penicillins Hives  . Shellfish Allergy Hives  . Diltiazem Hcl Other (See Comments)    : low heart rate  . Sulfonamide Derivatives Nausea And Vomiting  . Lovastatin Other (See Comments)    Pt doesn't remember a reaction  . Metformin Other (See Comments)    diarrhea    Family History  Problem Relation Age of Onset  . Diabetes Mother   . Hypertension Mother   . Heart attack Mother   . Stroke Father   . Coronary artery disease Other     Prior to Admission medications   Medication Sig Start Date End Date Taking? Authorizing Provider  acetaminophen (TYLENOL) 500 MG tablet Take 500 mg by mouth 2 (two) times daily as needed for pain.    Yes Historical Provider, MD  allopurinol (ZYLOPRIM) 100 MG tablet Take 100 mg by mouth daily.   Yes Historical Provider, MD  amLODipine (NORVASC) 10 MG tablet Take 1 tablet (10 mg total) by mouth at bedtime. 07/13/15  Yes Beatrice Lecher, PA-C  aspirin 81 MG tablet Take 81 mg by mouth daily.   Yes Historical Provider, MD  atorvastatin (LIPITOR) 20 MG tablet Take 1 tablet (20 mg total) by mouth daily. 01/26/15  Yes Tonny Bollman, MD  azelastine (OPTIVAR) 0.05 % ophthalmic solution Place 1 drop into both eyes 2 (two) times daily. 10/26/15  Yes Historical Provider, MD  carvedilol (COREG) 3.125 MG tablet Take 1 tablet (3.125 mg total) by mouth 2 (two) times daily. 04/16/15  Yes Tonny Bollman, MD  chlorthalidone (HYGROTON) 25 MG tablet Take 25 mg by mouth daily.  07/06/15  Yes Historical Provider, MD  clopidogrel (PLAVIX) 75 MG tablet Take 1 tablet by mouth  daily 04/16/15  Yes Tonny Bollman, MD  colchicine 0.6 MG tablet Take 0.6 mg by mouth daily as needed (gout).    Yes Historical Provider, MD  diclofenac (VOLTAREN) 75 MG EC tablet Take 75 mg by mouth 2 (two) times daily.  01/21/13  Yes Historical  Provider, MD  furosemide (LASIX) 40 MG tablet Take 40 mg by mouth daily. Reported on 08/24/2015   Yes Historical Provider, MD  HUMULIN N 100 UNIT/ML injection Inject 10-45 Units into the skin 2 (two) times daily before a meal. 45 units in the morning and 10 units at bedtime 07/06/15  Yes Historical Provider, MD  hydrALAZINE (APRESOLINE) 25 MG tablet Take 25 mg by mouth 3 (three) times daily.  07/06/15  Yes Historical Provider, MD  iron polysaccharides (NIFEREX) 150 MG capsule Take 150 mg by mouth daily.     Yes Historical Provider, MD  levothyroxine (SYNTHROID, LEVOTHROID) 100 MCG  tablet Take 1 tablet (100 mcg total) by mouth daily. 03/13/12  Yes Romero BellingSean Ellison, MD  losartan (COZAAR) 100 MG tablet Take 1 tablet (100 mg total) by mouth daily. 08/01/12  Yes Tonny BollmanMichael Cooper, MD  nitroGLYCERIN (NITROSTAT) 0.4 MG SL tablet Place 1 tablet (0.4 mg total) under the tongue every 5 (five) minutes as needed for chest pain. 10/30/12  Yes Tonny BollmanMichael Cooper, MD  pioglitazone (ACTOS) 15 MG tablet Take 15 mg by mouth daily.  07/06/15  Yes Historical Provider, MD  ONE TOUCH ULTRA TEST test strip 1 each by Other route daily as needed (blood sugar).  07/10/13   Historical Provider, MD    Physical Exam: Vitals:   11/23/15 0215 11/23/15 0230 11/23/15 0353 11/23/15 0401  BP: 151/61 154/61  (!) 187/49  Pulse: 63 64  65  Resp: 18 18    Temp:      TempSrc:    Oral  SpO2: 100% 100%  100%  Weight:   224 lb 4.8 oz (101.7 kg)   Height:   5\' 7"  (1.702 m)       Constitutional: Not in distress. Vitals:   11/23/15 0215 11/23/15 0230 11/23/15 0353 11/23/15 0401  BP: 151/61 154/61  (!) 187/49  Pulse: 63 64  65  Resp: 18 18    Temp:      TempSrc:    Oral  SpO2: 100% 100%  100%  Weight:   224 lb 4.8 oz (101.7 kg)   Height:   5\' 7"  (1.702 m)    Eyes: Anicteric no pallor. ENMT:  No discharge from the ears eyes nose or mouth. Neck:  No mass felt. No JVD appreciated. Respiratory:  No rhonchi or crepitations. Cardiovascular:   S1-S2 heard. Abdomen:  Soft nontender bowel sounds present. No guarding or rigidity. Musculoskeletal:  No edema. Skin:  No rash. Neurologic: alert awake oriented to time place and person. Moves all extremities. Psychiatric:  Appears normal.   Labs on Admission: I have personally reviewed following labs and imaging studies  CBC:  Recent Labs Lab 11/22/15 2315  WBC 16.2*  HGB 11.3*  HCT 35.5*  MCV 94.7  PLT 215   Basic Metabolic Panel:  Recent Labs Lab 11/22/15 2315  NA 134*  K 3.5  CL 106  CO2 21*  GLUCOSE 93  BUN 24*  CREATININE 1.13*  CALCIUM 9.1   GFR: Estimated Creatinine Clearance: 47.8 mL/min (by C-G formula based on SCr of 1.13 mg/dL). Liver Function Tests: No results for input(s): AST, ALT, ALKPHOS, BILITOT, PROT, ALBUMIN in the last 168 hours. No results for input(s): LIPASE, AMYLASE in the last 168 hours. No results for input(s): AMMONIA in the last 168 hours. Coagulation Profile: No results for input(s): INR, PROTIME in the last 168 hours. Cardiac Enzymes:  Recent Labs Lab 11/22/15 2315  TROPONINI 0.03*   BNP (last 3 results) No results for input(s): PROBNP in the last 8760 hours. HbA1C: No results for input(s): HGBA1C in the last 72 hours. CBG:  Recent Labs Lab 11/23/15 0405  GLUCAP 91   Lipid Profile: No results for input(s): CHOL, HDL, LDLCALC, TRIG, CHOLHDL, LDLDIRECT in the last 72 hours. Thyroid Function Tests: No results for input(s): TSH, T4TOTAL, FREET4, T3FREE, THYROIDAB in the last 72 hours. Anemia Panel: No results for input(s): VITAMINB12, FOLATE, FERRITIN, TIBC, IRON, RETICCTPCT in the last 72 hours. Urine analysis:    Component Value Date/Time   COLORURINE YELLOW 08/03/2014 0937   APPEARANCEUR CLEAR 08/03/2014 0937   LABSPEC 1.021 08/03/2014 [id number]  PHURINE 5.5 08/03/2014 0937   GLUCOSEU NEGATIVE 08/03/2014 0937   GLUCOSEU 100 (?) 09/11/2009 0000   HGBUR NEGATIVE 08/03/2014 0937   HGBUR negative 05/11/2010 1330    BILIRUBINUR NEGATIVE 08/03/2014 0937   KETONESUR NEGATIVE 08/03/2014 0937   PROTEINUR NEGATIVE 08/03/2014 0937   UROBILINOGEN 0.2 08/03/2014 0937   NITRITE NEGATIVE 08/03/2014 0937   LEUKOCYTESUR SMALL (A) 08/03/2014 0937   Sepsis Labs: @LABRCNTIP (procalcitonin:4,lacticidven:4) )No results found for this or any previous visit (from the past 240 hour(s)).   Radiological Exams on Admission: Dg Chest 2 View  Result Date: 11/23/2015 CLINICAL DATA:  Chest tightness for 2 hours. Irregular heart beat. Shortness of breath, nausea, and dizziness. EXAM: CHEST  2 VIEW COMPARISON:  11/29/2013 FINDINGS: Postoperative changes in the mediastinum. Cardiac pacemaker. Shallow inspiration. Heart size and pulmonary vascularity are normal. No focal airspace disease or consolidation in the lungs. No blunting of costophrenic angles. No pneumothorax. Degenerative changes in the spine. IMPRESSION: No active cardiopulmonary disease. Electronically Signed   By: Burman NievesWilliam  Stevens M.D.   On: 11/23/2015 01:10    EKG: Independently reviewed. Paced rhythm.  Assessment/Plan Principal Problem:   Unstable angina (HCC) Active Problems:   Essential hypertension   Chronic diastolic heart failure (HCC)   CKD (chronic kidney disease) stage 2, GFR 60-89 ml/min   DM (diabetes mellitus), type 2 with renal complications (HCC)   Chest pain   1. Chest pain concerning for unstable angina - will continue with aspirin, Plavix, Coreg and statins and I have placed patient on nitroglycerin patch. Cycle cardiac markers. Will keep patient nothing by mouth except medication in anticipation of possible stress test or cardiac cath. In anticipation of which will hold patient's Lasix and Cozaar for now.  2. Diabetes mellitus type 2 - patient is on NPH insulin which we will reduce the dose for now since patient is nothing by mouth. Patient takes 45 units in the morning and 10 in the night. For now I have placed patient on NPH 10 units twice a day  with sliding scale coverage. Closely follow CBGs and change back to home dose once cardiac procedures all over. 3. Hypertension uncontrolled - we'll continue home medications. For now holding off Cozaar due to possible cardiac cath. Patient is on when necessary IV hydralazine and nitroglycerin patch. 4. Chronic kidney disease stage II - creatinine appears to be at baseline. 5. Diastolic CHF last EF measured in April 2017 was 55-60% - appears euvolemic holding Lasix due to possible cardiac cath. 6. Hypothyroidism on Synthroid. 7. Anemia follow CBC.   DVT prophylaxis:  Lovenox. Code Status:  DO NOT RESUSCITATE.  Family Communication:  Discussed with patient.  Disposition Plan:  Home.  Consults called:  None.  Admission status:  Observation. Telemetry.    Eduard ClosKAKRAKANDY,Eustace Hur N. MD [facility] Pager [phone].  If 7PM-7AM, please contact night-coverage [email or web address] Password [email or web address]  11/23/2015, 4:24 AM

## 2015-11-23 NOTE — Care Management Obs Status (Signed)
MEDICARE OBSERVATION STATUS NOTIFICATION   Patient Details  Name: [name] MRN: [id number] Date of Birth: 1932-10-25   Medicare Observation Status Notification Given:  Yes    Hanley HaysDowell, Carleen Rhue T, RN 11/23/2015, 1:15 PM

## 2015-11-24 ENCOUNTER — Observation Stay (HOSPITAL_BASED_OUTPATIENT_CLINIC_OR_DEPARTMENT_OTHER): Payer: Medicare Other

## 2015-11-24 ENCOUNTER — Observation Stay (HOSPITAL_COMMUNITY): Payer: Medicare Other

## 2015-11-24 ENCOUNTER — Encounter (HOSPITAL_COMMUNITY): Payer: Self-pay | Admitting: General Practice

## 2015-11-24 DIAGNOSIS — R079 Chest pain, unspecified: Secondary | ICD-10-CM

## 2015-11-24 DIAGNOSIS — E1122 Type 2 diabetes mellitus with diabetic chronic kidney disease: Secondary | ICD-10-CM

## 2015-11-24 DIAGNOSIS — Z794 Long term (current) use of insulin: Secondary | ICD-10-CM

## 2015-11-24 DIAGNOSIS — I200 Unstable angina: Secondary | ICD-10-CM | POA: Diagnosis not present

## 2015-11-24 DIAGNOSIS — I25119 Atherosclerotic heart disease of native coronary artery with unspecified angina pectoris: Secondary | ICD-10-CM

## 2015-11-24 DIAGNOSIS — I10 Essential (primary) hypertension: Secondary | ICD-10-CM | POA: Diagnosis not present

## 2015-11-24 DIAGNOSIS — N182 Chronic kidney disease, stage 2 (mild): Secondary | ICD-10-CM | POA: Diagnosis not present

## 2015-11-24 LAB — NM MYOCAR MULTI W/SPECT W/WALL MOTION / EF
CHL CUP MPHR: 139 {beats}/min
CHL CUP NUCLEAR SRS: 1
CHL CUP STRESS STAGE 1 SPEED: 0 mph
CHL CUP STRESS STAGE 2 GRADE: 0 %
CHL CUP STRESS STAGE 2 HR: 62 {beats}/min
CHL CUP STRESS STAGE 2 SPEED: 0 mph
CHL CUP STRESS STAGE 3 DBP: 47 mmHg
CHL CUP STRESS STAGE 3 SBP: 122 mmHg
CHL CUP STRESS STAGE 4 DBP: 53 mmHg
CHL CUP STRESS STAGE 4 GRADE: 0 %
CSEPPMHR: 45 %
Estimated workload: 1 METS
LV sys vol: 32 mL
LVDIAVOL: 81 mL (ref 46–106)
Peak BP: 147 mmHg
Peak HR: 63 {beats}/min
Percent HR: 53 %
RATE: 0.26
Rest HR: 63 {beats}/min
SDS: 3
SSS: 4
Stage 1 Grade: 0 %
Stage 1 HR: 62 {beats}/min
Stage 3 Grade: 0 %
Stage 3 HR: 74 {beats}/min
Stage 3 Speed: 0 mph
Stage 4 HR: 63 {beats}/min
Stage 4 SBP: 147 mmHg
Stage 4 Speed: 0 mph
TID: 1.05

## 2015-11-24 LAB — CBC WITH DIFFERENTIAL/PLATELET
BASOS ABS: 0 10*3/uL (ref 0.0–0.1)
BASOS PCT: 0 %
EOS ABS: 0.2 10*3/uL (ref 0.0–0.7)
Eosinophils Relative: 1 %
HCT: 35.5 % — ABNORMAL LOW (ref 36.0–46.0)
HEMOGLOBIN: 11.3 g/dL — AB (ref 12.0–15.0)
Lymphocytes Relative: 17 %
Lymphs Abs: 1.8 10*3/uL (ref 0.7–4.0)
MCH: 29.9 pg (ref 26.0–34.0)
MCHC: 31.8 g/dL (ref 30.0–36.0)
MCV: 93.9 fL (ref 78.0–100.0)
MONOS PCT: 8 %
Monocytes Absolute: 0.8 10*3/uL (ref 0.1–1.0)
NEUTROS PCT: 74 %
Neutro Abs: 7.9 10*3/uL — ABNORMAL HIGH (ref 1.7–7.7)
Platelets: 199 10*3/uL (ref 150–400)
RBC: 3.78 MIL/uL — ABNORMAL LOW (ref 3.87–5.11)
RDW: 13.4 % (ref 11.5–15.5)
WBC: 10.6 10*3/uL — ABNORMAL HIGH (ref 4.0–10.5)

## 2015-11-24 LAB — GLUCOSE, CAPILLARY
GLUCOSE-CAPILLARY: 112 mg/dL — AB (ref 65–99)
GLUCOSE-CAPILLARY: 150 mg/dL — AB (ref 65–99)
GLUCOSE-CAPILLARY: 165 mg/dL — AB (ref 65–99)
GLUCOSE-CAPILLARY: 99 mg/dL (ref 65–99)
Glucose-Capillary: 110 mg/dL — ABNORMAL HIGH (ref 65–99)

## 2015-11-24 MED ORDER — REGADENOSON 0.4 MG/5ML IV SOLN
INTRAVENOUS | Status: AC
  Filled 2015-11-24: qty 5

## 2015-11-24 MED ORDER — TECHNETIUM TC 99M TETROFOSMIN IV KIT
30.0000 | PACK | Freq: Once | INTRAVENOUS | Status: AC | PRN
  Administered 2015-11-24: 30 via INTRAVENOUS

## 2015-11-24 MED ORDER — TECHNETIUM TC 99M TETROFOSMIN IV KIT
10.0000 | PACK | Freq: Once | INTRAVENOUS | Status: AC | PRN
  Administered 2015-11-24: 10 via INTRAVENOUS

## 2015-11-24 MED ORDER — REGADENOSON 0.4 MG/5ML IV SOLN
0.4000 mg | Freq: Once | INTRAVENOUS | Status: AC
  Administered 2015-11-24: 0.4 mg via INTRAVENOUS
  Filled 2015-11-24: qty 5

## 2015-11-24 NOTE — Progress Notes (Signed)
The patient was seen in nuclear medicine for a lexiscan myoview. She tolerated the procedure well.   PERFUSION STUDY TO FOLLOW  Suzzette RighterErin Smith, NP  [facility]

## 2015-11-24 NOTE — Progress Notes (Signed)
Order received to discharge patient.  Patient expresses readiness to discharge.  Discharge instructions, follow up, medications and instructions for their use were discussed with patient and patient voiced understanding.  Telemetry monitor removed and CCMD notified. 

## 2015-11-24 NOTE — Discharge Summary (Signed)
Physician Discharge Summary  [name] JXB:[id number] DOB: 1932-10-01 DOA: 11/22/2015  PCP: Geraldo PitterBLAND,VEITA J, MD  Admit date: 11/22/2015 Discharge date: 11/24/2015   Recommendations for Outpatient Follow-Up:   Outpatient TSH if not done recently  Discharge Diagnosis:   Principal Problem:   Unstable angina [facility](HCC) Active Problems:   Essential hypertension   Chronic diastolic heart failure (HCC)   CKD (chronic kidney disease) stage 2, GFR 60-89 ml/min   DM (diabetes mellitus), type 2 with renal complications (HCC)   Chest pain   Discharge disposition:  Home.  Discharge Condition: Improved.  Diet recommendation: Low sodium, heart healthy.  Carbohydrate-modified  Wound care: None.   History of Present Illness:   Samantha SneddonShirley A [name] is a 82 y.o. female with CAD status post PCI and CABG last cardiac cath in August 2015 which showed patent bypass graft presents to the ER because of chest pain. Patient's symptoms started last evening while at home. Patient felt retrosternal pressure-like symptoms nonradiating no associated shortness of breath diaphoresis. Patient took a nitroglycerin sublingual despite which discomfort persisted and patient had to take another one 7 minutes later following which patient's symptoms resolved. In the ER EKG was showing atrial paced rhythm and cardiac markers are negative and given the patient's history, patient is being admitted for further management and workup. Presently patient is chest pain-free. Abdomen appears benign.    Hospital Course by Problem:   1. Chest pain concerning for unstable angina - will continue with aspirin, Plavix, Coreg and statins.  s/p stress test that was low risk 2. Diabetes mellitus type 2 -resume home insulin 3. Hypertension uncontrolled - resume home meds 4. Chronic kidney disease stage II - creatinine appears to be at baseline. 5. Diastolic CHF last EF measured in April 2017 was 55-60% - appears euvolemic holding Lasix  due to possible cardiac cath. 6. Hypothyroidism on Synthroid- outpatient TSH 7.  Leukocytosis- no sign of PNA/UTI-- CBC in 1-2 weeks as outpatient- patient instructed to return with fever.    Medical Consultants:    cards   Discharge Exam:   Vitals:   11/24/15 1100 11/24/15 1300  BP: (!) 155/48 (!) 149/44  Pulse: 63 (!) 59  Resp: 20 18  Temp: 98.3 F (36.8 C) 97.5 F (36.4 C)   Vitals:   11/24/15 1009 11/24/15 1011 11/24/15 1100 11/24/15 1300  BP: (!) 124/46 (!) 147/53 (!) 155/48 (!) 149/44  Pulse:   63 (!) 59  Resp:   20 18  Temp:   98.3 F (36.8 C) 97.5 F (36.4 C)  TempSrc:   Oral Oral  SpO2:   100% 100%  Weight:      Height:        Gen:  NAD    The results of significant diagnostics from this hospitalization (including imaging, microbiology, ancillary and laboratory) are listed below for reference.     Procedures and Diagnostic Studies:   Dg Chest 2 View  Result Date: 11/23/2015 CLINICAL DATA:  Chest tightness for 2 hours. Irregular heart beat. Shortness of breath, nausea, and dizziness. EXAM: CHEST  2 VIEW COMPARISON:  11/29/2013 FINDINGS: Postoperative changes in the mediastinum. Cardiac pacemaker. Shallow inspiration. Heart size and pulmonary vascularity are normal. No focal airspace disease or consolidation in the lungs. No blunting of costophrenic angles. No pneumothorax. Degenerative changes in the spine. IMPRESSION: No active cardiopulmonary disease. Electronically Signed   By: Burman NievesWilliam  Stevens M.D.   On: 11/23/2015 01:10     Labs:   Basic Metabolic Panel:  Recent Labs Lab 11/22/15 2315  NA 134*  K 3.5  CL 106  CO2 21*  GLUCOSE 93  BUN 24*  CREATININE 1.13*  CALCIUM 9.1   GFR Estimated Creatinine Clearance: 47.8 mL/min (by C-G formula based on SCr of 1.13 mg/dL). Liver Function Tests: No results for input(s): AST, ALT, ALKPHOS, BILITOT, PROT, ALBUMIN in the last 168 hours. No results for input(s): LIPASE, AMYLASE in the last 168  hours. No results for input(s): AMMONIA in the last 168 hours. Coagulation profile No results for input(s): INR, PROTIME in the last 168 hours.  CBC:  Recent Labs Lab 11/22/15 2315 11/24/15 1344  WBC 16.2* 10.6*  NEUTROABS  --  7.9*  HGB 11.3* 11.3*  HCT 35.5* 35.5*  MCV 94.7 93.9  PLT 215 199   Cardiac Enzymes:  Recent Labs Lab 11/22/15 2315 11/23/15 0450 11/23/15 1150 11/23/15 1644  TROPONINI 0.03* 0.03* <0.03 <0.03   BNP: Invalid input(s): POCBNP CBG:  Recent Labs Lab 11/24/15 0006 11/24/15 0219 11/24/15 0415 11/24/15 0759 11/24/15 1139  GLUCAP 165* 150* 110* 112* 99   D-Dimer No results for input(s): DDIMER in the last 72 hours. Hgb A1c No results for input(s): HGBA1C in the last 72 hours. Lipid Profile No results for input(s): CHOL, HDL, LDLCALC, TRIG, CHOLHDL, LDLDIRECT in the last 72 hours. Thyroid function studies No results for input(s): TSH, T4TOTAL, T3FREE, THYROIDAB in the last 72 hours.  Invalid input(s): FREET3 Anemia work up No results for input(s): VITAMINB12, FOLATE, FERRITIN, TIBC, IRON, RETICCTPCT in the last 72 hours. Microbiology No results found for this or any previous visit (from the past 240 hour(s)).   Discharge Instructions:   Discharge Instructions    Diet - low sodium heart healthy    Complete by:  As directed   Diet Carb Modified    Complete by:  As directed   Discharge instructions    Complete by:  As directed   TSH if not checked recently by PCP   Increase activity slowly    Complete by:  As directed       Medication List    TAKE these medications   acetaminophen 500 MG tablet Commonly known as:  TYLENOL Take 500 mg by mouth 2 (two) times daily as needed for pain.   allopurinol 100 MG tablet Commonly known as:  ZYLOPRIM Take 100 mg by mouth daily.   amLODipine 10 MG tablet Commonly known as:  NORVASC Take 1 tablet (10 mg total) by mouth at bedtime.   aspirin 81 MG tablet Take 81 mg by mouth daily.     atorvastatin 20 MG tablet Commonly known as:  LIPITOR Take 1 tablet (20 mg total) by mouth daily.   azelastine 0.05 % ophthalmic solution Commonly known as:  OPTIVAR Place 1 drop into both eyes 2 (two) times daily.   carvedilol 3.125 MG tablet Commonly known as:  COREG Take 1 tablet (3.125 mg total) by mouth 2 (two) times daily.   chlorthalidone 25 MG tablet Commonly known as:  HYGROTON Take 25 mg by mouth daily.   clopidogrel 75 MG tablet Commonly known as:  PLAVIX Take 1 tablet by mouth  daily   colchicine 0.6 MG tablet Take 0.6 mg by mouth daily as needed (gout).   diclofenac 75 MG EC tablet Commonly known as:  VOLTAREN Take 75 mg by mouth 2 (two) times daily.   furosemide 40 MG tablet Commonly known as:  LASIX Take 40 mg by mouth daily. Reported on 08/24/2015  HUMULIN N 100 UNIT/ML injection Generic drug:  insulin NPH Human Inject 10-45 Units into the skin 2 (two) times daily before a meal. 45 units in the morning and 10 units at bedtime   hydrALAZINE 25 MG tablet Commonly known as:  APRESOLINE Take 25 mg by mouth 3 (three) times daily.   iron polysaccharides 150 MG capsule Commonly known as:  NIFEREX Take 150 mg by mouth daily.   levothyroxine 100 MCG tablet Commonly known as:  SYNTHROID, LEVOTHROID Take 1 tablet (100 mcg total) by mouth daily.   losartan 100 MG tablet Commonly known as:  COZAAR Take 1 tablet (100 mg total) by mouth daily.   nitroGLYCERIN 0.4 MG SL tablet Commonly known as:  NITROSTAT Place 1 tablet (0.4 mg total) under the tongue every 5 (five) minutes as needed for chest pain.   ONE TOUCH ULTRA TEST test strip Generic drug:  glucose blood 1 each by Other route daily as needed (blood sugar).   pioglitazone 15 MG tablet Commonly known as:  ACTOS Take 15 mg by mouth daily.      Follow-up Information    Tereso NewcomerScott Weaver, PA-C Follow up on 12/23/2015.   Specialties:  Cardiology, Physician Assistant Why:  at 10:15am for hospital  follow up.  Contact information: [address] [place] [address] [phone]            Time coordinating discharge: 35 min  Signed:  Tavian Callander Juanetta GoslingU Russel Morain   [facility] 11/24/2015, 3:29 PM

## 2015-11-24 NOTE — Progress Notes (Signed)
     SUBJECTIVE: No chest pain or SOB this am.   Tele: AV paced.   BP (!) 158/47   Pulse 62   Temp 98 F (36.7 C) (Oral)   Resp 20   Ht 5\' 7"  (1.702 m)   Wt 224 lb 4.8 oz (101.7 kg)   SpO2 100%   BMI 35.13 kg/m  No intake or output data in the 24 hours ending 11/24/15 0759  PHYSICAL EXAM General: Well developed, well nourished, in no acute distress. Alert and oriented x 3.  Psych:  Good affect, responds appropriately Neck: No JVD. No masses noted.  Lungs: Clear bilaterally with no wheezes or rhonci noted.  Heart: RRR with systolic murmur noted. Abdomen: Bowel sounds are present. Soft, non-tender.  Extremities: Trace to 1+ bilateral lower extremity edema.   LABS: Basic Metabolic Panel:  Recent Labs  45/40/9808/13/17 2315  NA 134*  K 3.5  CL 106  CO2 21*  GLUCOSE 93  BUN 24*  CREATININE 1.13*  CALCIUM 9.1   CBC:  Recent Labs  11/22/15 2315  WBC 16.2*  HGB 11.3*  HCT 35.5*  MCV 94.7  PLT 215   Cardiac Enzymes:  Recent Labs  11/23/15 0450 11/23/15 1150 11/23/15 1644  TROPONINI 0.03* <0.03 <0.03   Current Meds: . allopurinol  100 mg Oral Daily  . amLODipine  10 mg Oral QHS  . aspirin  81 mg Oral Daily  . atorvastatin  20 mg Oral q1800  . carvedilol  3.125 mg Oral BID WC  . chlorthalidone  25 mg Oral Daily  . clopidogrel  75 mg Oral Daily  . enoxaparin (LOVENOX) injection  40 mg Subcutaneous Q24H  . hydrALAZINE  25 mg Oral Q8H  . insulin aspart  0-9 Units Subcutaneous Q4H  . insulin NPH Human  10 Units Subcutaneous BID AC & HS  . iron polysaccharides  150 mg Oral Daily  . ketotifen  1 drop Both Eyes BID  . levothyroxine  100 mcg Oral QAC breakfast  . nitroGLYCERIN  0.4 mg Transdermal Daily  . pioglitazone  15 mg Oral QAC breakfast     ASSESSMENT AND PLAN: 82 y.o.femalewith history of CAD s/p CABG and prior PCI admitted with chest pain and chills. Her chest pain resolved with SL NTG x 2. Troponin essentially normal.   1. CAD with chest pain:  cannot exclude unstable angina. Pt with known CAD s/p CABG. Admitted with chest pain. Troponin negative. EKG with AV pacing. Plans in place for stress myoview today. We will follow up the results of the stress test and make further plans. Continue ASA, statin, beta blocker and Plavix.   2. Hypertension: BP elevated on current home therapy. Monitor and consider increasing Hydralazine.    [name]  8/15/20177:59 AM

## 2015-12-01 ENCOUNTER — Encounter (HOSPITAL_COMMUNITY): Payer: Medicare Other

## 2015-12-04 ENCOUNTER — Other Ambulatory Visit: Payer: Self-pay | Admitting: Cardiovascular Disease

## 2015-12-08 DIAGNOSIS — I10 Essential (primary) hypertension: Secondary | ICD-10-CM | POA: Diagnosis not present

## 2015-12-08 DIAGNOSIS — N189 Chronic kidney disease, unspecified: Secondary | ICD-10-CM | POA: Diagnosis not present

## 2015-12-08 DIAGNOSIS — E0821 Diabetes mellitus due to underlying condition with diabetic nephropathy: Secondary | ICD-10-CM | POA: Diagnosis not present

## 2015-12-08 DIAGNOSIS — I1310 Hypertensive heart and chronic kidney disease without heart failure, with stage 1 through stage 4 chronic kidney disease, or unspecified chronic kidney disease: Secondary | ICD-10-CM | POA: Diagnosis not present

## 2015-12-14 ENCOUNTER — Other Ambulatory Visit: Payer: Self-pay | Admitting: Cardiovascular Disease

## 2015-12-14 DIAGNOSIS — E785 Hyperlipidemia, unspecified: Secondary | ICD-10-CM

## 2015-12-16 ENCOUNTER — Encounter: Payer: Self-pay | Admitting: Physician Assistant

## 2015-12-21 ENCOUNTER — Telehealth: Payer: Self-pay | Admitting: Physician Assistant

## 2015-12-21 DIAGNOSIS — I779 Disorder of arteries and arterioles, unspecified: Secondary | ICD-10-CM

## 2015-12-21 DIAGNOSIS — I739 Peripheral vascular disease, unspecified: Principal | ICD-10-CM

## 2015-12-21 NOTE — Telephone Encounter (Signed)
New message    Pt verbalized that she is calling to reschedule the Carotid, there is no order.

## 2015-12-21 NOTE — Telephone Encounter (Signed)
Message sent to me today by Yolande JollyKedra in scheduling who states ptcb to reschedule carotid us, states no order in epic . New order placed in epic today; though I still see the other order as well that was place 07/2015. I will ask for scheduling to call pt back and now schedule test.

## 2015-12-23 ENCOUNTER — Ambulatory Visit: Payer: Medicare Other | Admitting: Physician Assistant

## 2015-12-28 ENCOUNTER — Ambulatory Visit (HOSPITAL_COMMUNITY)
Admission: RE | Admit: 2015-12-28 | Discharge: 2015-12-28 | Disposition: A | Discharge status: Home / Self Care | Payer: Medicare Other | Source: Ambulatory Visit | Attending: Physician Assistant | Admitting: Physician Assistant

## 2015-12-28 DIAGNOSIS — I779 Disorder of arteries and arterioles, unspecified: Secondary | ICD-10-CM | POA: Diagnosis present

## 2015-12-28 DIAGNOSIS — I10 Essential (primary) hypertension: Secondary | ICD-10-CM | POA: Diagnosis not present

## 2015-12-28 DIAGNOSIS — I6523 Occlusion and stenosis of bilateral carotid arteries: Secondary | ICD-10-CM | POA: Diagnosis not present

## 2015-12-28 DIAGNOSIS — I2510 Atherosclerotic heart disease of native coronary artery without angina pectoris: Secondary | ICD-10-CM | POA: Diagnosis not present

## 2015-12-28 DIAGNOSIS — E119 Type 2 diabetes mellitus without complications: Secondary | ICD-10-CM | POA: Insufficient documentation

## 2015-12-28 DIAGNOSIS — Z951 Presence of aortocoronary bypass graft: Secondary | ICD-10-CM | POA: Diagnosis not present

## 2015-12-28 DIAGNOSIS — E785 Hyperlipidemia, unspecified: Secondary | ICD-10-CM | POA: Diagnosis not present

## 2015-12-28 DIAGNOSIS — I739 Peripheral vascular disease, unspecified: Secondary | ICD-10-CM | POA: Insufficient documentation

## 2015-12-28 DIAGNOSIS — Z87891 Personal history of nicotine dependence: Secondary | ICD-10-CM | POA: Insufficient documentation

## 2015-12-29 ENCOUNTER — Telehealth: Payer: Self-pay | Admitting: *Deleted

## 2015-12-29 ENCOUNTER — Encounter: Payer: Self-pay | Admitting: Physician Assistant

## 2015-12-29 NOTE — Telephone Encounter (Signed)
Pt notified of carotid results and findings by phone with verbal understanding. Pt scheduled appt to see Bing NeighborsScott W. PA 10/23 same day Dr. Excell Seltzerooper is in the office , this is based on 07/2015 ov note needing 6 month f/u..Marland Kitchen

## 2016-01-18 ENCOUNTER — Encounter: Payer: Self-pay | Admitting: Physician Assistant

## 2016-01-26 DIAGNOSIS — I10 Essential (primary) hypertension: Secondary | ICD-10-CM | POA: Diagnosis not present

## 2016-01-26 DIAGNOSIS — M10172 Lead-induced gout, left ankle and foot: Secondary | ICD-10-CM | POA: Diagnosis not present

## 2016-01-26 DIAGNOSIS — E119 Type 2 diabetes mellitus without complications: Secondary | ICD-10-CM | POA: Diagnosis not present

## 2016-01-29 ENCOUNTER — Ambulatory Visit (INDEPENDENT_AMBULATORY_CARE_PROVIDER_SITE_OTHER): Payer: Medicare Other | Admitting: *Deleted

## 2016-01-29 ENCOUNTER — Telehealth: Payer: Self-pay | Admitting: Cardiology

## 2016-01-29 DIAGNOSIS — I495 Sick sinus syndrome: Secondary | ICD-10-CM

## 2016-01-29 NOTE — Telephone Encounter (Signed)
Called pt and requested that she send a remote transmission for her PPM. Pt verbalized understanding and said we would have it within the hour.

## 2016-02-01 ENCOUNTER — Ambulatory Visit: Payer: Medicare Other | Admitting: Physician Assistant

## 2016-02-01 NOTE — Progress Notes (Deleted)
Cardiology Office Note:    Date:  02/01/2016   ID:  [name], DOB 27-Jul-1932, MRN [id number]  PCP:  Geraldo Pitter, MD  Cardiologist:  Dr. Tonny Bollman   Electrophysiologist:  Dr. Hillis Range   Referring MD: Renaye Rakers, MD   No chief complaint on file. ***  History of Present Illness:    [name] is a 82 y.o. female with a hx of CAD s/p CABG in 2001, SSS s/p PPM, diastolic CHF, HTN, DM, CKD.  LHC in 8/15 demonstrated patent bypass grafts.  Last seen in 4/17.    She was admitted in 8/17 with complaints of chest pain.  MI was r/o and she was seen by Cardiology.  Inpatient Myoview was low risk.    She returns for FU.  ***  Prior CV studies that were reviewed today include:    Carotid US 9/17 R 1-39; L 40-59 >> FU 1 year  Myoview 11/24/15 The patient has V pacing with corresponding ECG changes. The study is normal. No ischemia. No evidence of MI This is a low risk study. The left ventricular ejection fraction is normal (55-65%). Nuclear stress EF: 60%.  Echo 07/13/15 EF 55-60, no RWMA, MAD, mod LAE, mild RVE, PASP 35  Carotid US 12/12/13 R 1-39% L 40-59% FU 1 year  LHC 12/02/13 LM: patent. LAD: 95% proximal in-stent restenosis. Diffuse disease throughout the remainder of the vessel that in the left ventricular apex. Reflux into the mammary is noted to reflux into the diagonal graft is also noted. LCx: totally occluded. 40% stenosis proximal to the first obtuse marginal; the distal circumflex is totally occluded. RCA: totally occluded distally. SVG to OM 1 and OM 2 is widely patent. SVG to diagonal #1 is widely patent. SVG to PDA is widely patent. LIMA to LAD is widely patent EF: 60%.  Echo 03/06/12 EF 55% to 60%. Grade 2 diastolic dysfunction, MAC, trivial MR, mild LAE, normal RVSF, mild RAE, PASP 39 mmHg  Myoview 05/2005 Normal study, EF 63%  Past Medical History:  Diagnosis Date  . Allergic rhinitis   . Anemia   . Anxiety   . Barrett  esophagus   . CAD (coronary artery disease) 2009   a. Multivessel s/p PCI w/DES 2009 // b. s/p CABG 2011  //  c. LHC 8/15: pLAD 95 ISR, LCx 100, pOM1 40, dRCA 100, S-OM1/OM2 ok, S-D1 ok, S-PDA ok, L-LAD ok, EF 60%  . Carotid artery disease (HCC)    a. Carotid US 9/15: RICA 1-39%; LICA 40-59% >> FU 1 year  //  b. Carotid US 9/17: R 1-39%, L 40-59% >> FU 1 year  . Chronic diastolic heart failure (HCC)   . CKD (chronic kidney disease), stage II    GFR 60-89 ml/min  . Depression   . Disc disease, degenerative, cervical   . Diverticulosis   . Gastroparesis   . GERD (gastroesophageal reflux disease)   . Gout   . H/O hiatal hernia   . Helicobacter pylori gastritis   . History of echocardiogram    a. Echo 11/13: EF 55% to 60%. Grade 2 diastolic dysfunction, MAC, trivial MR, mild LAE, normal RVSF, mild RAE, PASP 39 mmHg  //  b. Echo 4/17: EF 55-60%, normal wall motion, trivial AI, MAC, moderate LAE, mild RVE, PASP 35 mmHg  . History of thrombocytopenia   . HTN (hypertension)   . Hyperlipidemia   . Hypothyroidism   . LBP (low back pain)    Lumbar disc  disease/lumbar spinal stenosis  . Morbid obesity (HCC)   . Myocardial infarction   . Osteoarthritis   . Osteopenia   . PVD (peripheral vascular disease) (HCC)   . Sick sinus syndrome [facility])    MDT Dual-chamber PPM implant 02/2012  . Type II or unspecified type diabetes mellitus without mention of complication, not stated as uncontrolled     Past Surgical History:  Procedure Laterality Date  . ABDOMINAL HYSTERECTOMY    . CARDIAC CATHETERIZATION     2011  DR COOPER (APPT NEXT WEEK)  . CHOLECYSTECTOMY    . CORONARY ARTERY BYPASS GRAFT  2011   LIMA-LAD, SVG-DIAG, SVG-OM1-OM2, SVG-PDA  . CORONARY STENT PLACEMENT     Drug-eluting stent to the left anterior descending, circumflex and right coronary artery in Jan 2009  . EYE SURGERY     BIL CATARACT REMOVAL 06/2010  . LEFT HEART CATHETERIZATION WITH CORONARY ANGIOGRAM N/A 12/02/2013    Procedure: LEFT HEART CATHETERIZATION WITH CORONARY ANGIOGRAM;  Surgeon: Lesleigh Noe, MD;  Location: [facility] C[facility] LAB;  Service: Cardiovascular;  Laterality: N/A;  . OVARIAN CYST REMOVAL    . PACEMAKER INSERTION  03/06/12   MDT Adapta L implanted by Dr Johney Frame for SSS  . PERMANENT PACEMAKER INSERTION N/A 03/06/2012   Procedure: PERMANENT PACEMAKER INSERTION;  Surgeon: Hillis Range, MD;  Location[facility] LAB;  Service: Cardiovascular;  Laterality: N/A;  . SHOULDER ARTHROSCOPY  06/16/2011   Procedure: ARTHROSCOPY SHOULDER;  Surgeon: Kennieth Rad, MD;  Location[facility];  Service: Orthopedics;  Laterality: Left;  LEFT SHOULDER ARTHROSCOPY ACROMIALPLASTY, POSSIBLE MINI OPEN CUFF REPAIR   . TUBAL LIGATION      Current Medications: No outpatient prescriptions have been marked as taking for the 02/01/16 encounter (Appointment) with Beatrice Lecher, PA-C.     Allergies:   Ciprofloxacin; Codeine; Hydrocodone; Penicillins; Shellfish allergy; Diltiazem hcl; Sulfonamide derivatives; Lovastatin; and Metformin   Social History   Social History  . Marital status: Widowed    Spouse name: N/A  . Number of children: N/A  . Years of education: N/A   Occupational History  . RETIRED LPN    Social History Main Topics  . Smoking status: Former Games developer  . Smokeless tobacco: Never Used     Comment: quit 30 yrs ago  . Alcohol use No  . Drug use: No  . Sexual activity: Not on file   Other Topics Concern  . Not on file   Social History Narrative   Widowed 2004.., Lives alone..Family history is negative for premature coronary artery disease. Mother died at age 63 with heart disease in her later years, father died at age 29 from a stroke.Marland KitchenShe  has 8 siblings, none of whom have coronary artery disease.           Family History:  The patient's ***family history includes Coronary artery disease in her other; Diabetes in her mother; Heart attack in her mother; Hypertension in her mother; Stroke in her  father.   ROS:   Please see the history of present illness.    ROS All other systems reviewed and are negative.   EKGs/Labs/Other Test Reviewed:    EKG:  EKG is *** ordered today.  The ekg ordered today demonstrates ***  Recent Labs: 05/11/2015: ALT 20 11/22/2015: B Natriuretic Peptide 75.7; BUN 24; Creatinine, Ser 1.13; Potassium 3.5; Sodium 134 11/24/2015: Hemoglobin 11.3; Platelets 199   Recent Lipid Panel    Component Value Date/Time   CHOL 149 05/11/2015 0749  TRIG 66 05/11/2015 0749   TRIG 192 (H) 04/20/2006 1246   HDL 97 05/11/2015 0749   CHOLHDL 1.5 05/11/2015 0749   VLDL 13 05/11/2015 0749   LDLCALC 39 05/11/2015 0749   LDLDIRECT 82.5 09/11/2009 0000     Physical Exam:    VS:  There were no vitals taken for this visit.    Wt Readings from Last 3 Encounters:  11/23/15 224 lb 4.8 oz (101.7 kg)  07/13/15 236 lb 12.8 oz (107.4 kg)  04/16/15 240 lb 3.2 oz (109 kg)     ***Physical Exam  ASSESSMENT:    1. Coronary artery disease involving native coronary artery of native heart without angina pectoris   2. Chronic diastolic heart failure (HCC)   3. Essential hypertension   4. Pure hypercholesterolemia   5. Bilateral carotid artery disease (HCC)   6. Cardiac pacemaker in situ    PLAN:    In order of problems listed above:  1. CAD - s/p CABG.  LHC with patent grafts in 2015.  Myoview in 8/17 during admission for chest pain was low risk.  ***  -  Continue ASA, Plavix, beta-blocker, statin.  *** 2. Chronic diastolic CHF -  *** 3. Essential hypertension - *** 4. Hyperlipidemia - *** LDL in 1/17 was 39.  Continue statin. 5. Carotid artery disease - Recent US with stable disease. Repeat in 12/2016. 6. S/p PPM - FU with EP as planned.   Medication Adjustments/Labs and Tests Ordered: Current medicines are reviewed at length with the patient today.  Concerns regarding medicines are outlined above.  Medication changes, Labs and Tests ordered today are outlined  in the Patient Instructions noted below. There are no Patient Instructions on file for this visit. Signed, Tereso NewcomerScott Weaver, PA-C  02/01/2016 8:27 AM    [facility] [address], [place], [place]  [address] Phone: [phone]; Fax: [phone]

## 2016-02-02 ENCOUNTER — Encounter: Payer: Self-pay | Admitting: Cardiology

## 2016-02-02 NOTE — Progress Notes (Signed)
Remote pacemaker transmission.   

## 2016-02-05 LAB — CUP PACEART REMOTE DEVICE CHECK
Battery Remaining Longevity: 105 mo
Brady Statistic AP VP Percent: 54 %
Date Time Interrogation Session: 20171020184406
Implantable Lead Implant Date: 20131126
Implantable Lead Model: 5092
Lead Channel Pacing Threshold Amplitude: 0.5 V
Lead Channel Pacing Threshold Amplitude: 0.875 V
Lead Channel Pacing Threshold Pulse Width: 0.4 ms
Lead Channel Setting Pacing Amplitude: 2.5 V
Lead Channel Setting Sensing Sensitivity: 2 mV
MDC IDC LEAD IMPLANT DT: 20131126
MDC IDC LEAD LOCATION: 753859
MDC IDC LEAD LOCATION: 753860
MDC IDC MSMT BATTERY IMPEDANCE: 233 Ohm
MDC IDC MSMT BATTERY VOLTAGE: 2.79 V
MDC IDC MSMT LEADCHNL RA IMPEDANCE VALUE: 410 Ohm
MDC IDC MSMT LEADCHNL RA PACING THRESHOLD PULSEWIDTH: 0.4 ms
MDC IDC MSMT LEADCHNL RA SENSING INTR AMPL: 1.4 mV
MDC IDC MSMT LEADCHNL RV IMPEDANCE VALUE: 434 Ohm
MDC IDC MSMT LEADCHNL RV SENSING INTR AMPL: 5.6 mV
MDC IDC SET LEADCHNL RA PACING AMPLITUDE: 2 V
MDC IDC SET LEADCHNL RV PACING PULSEWIDTH: 0.4 ms
MDC IDC STAT BRADY AP VS PERCENT: 25 %
MDC IDC STAT BRADY AS VP PERCENT: 9 %
MDC IDC STAT BRADY AS VS PERCENT: 13 %

## 2016-02-15 ENCOUNTER — Encounter (INDEPENDENT_AMBULATORY_CARE_PROVIDER_SITE_OTHER): Payer: Medicare Other | Admitting: Ophthalmology

## 2016-02-15 DIAGNOSIS — H35033 Hypertensive retinopathy, bilateral: Secondary | ICD-10-CM | POA: Diagnosis not present

## 2016-02-15 DIAGNOSIS — H43813 Vitreous degeneration, bilateral: Secondary | ICD-10-CM | POA: Diagnosis not present

## 2016-02-15 DIAGNOSIS — I10 Essential (primary) hypertension: Secondary | ICD-10-CM

## 2016-02-15 DIAGNOSIS — H353221 Exudative age-related macular degeneration, left eye, with active choroidal neovascularization: Secondary | ICD-10-CM

## 2016-03-07 DIAGNOSIS — E119 Type 2 diabetes mellitus without complications: Secondary | ICD-10-CM | POA: Diagnosis not present

## 2016-03-07 DIAGNOSIS — N189 Chronic kidney disease, unspecified: Secondary | ICD-10-CM | POA: Diagnosis not present

## 2016-03-07 DIAGNOSIS — I130 Hypertensive heart and chronic kidney disease with heart failure and stage 1 through stage 4 chronic kidney disease, or unspecified chronic kidney disease: Secondary | ICD-10-CM | POA: Diagnosis not present

## 2016-03-07 DIAGNOSIS — I10 Essential (primary) hypertension: Secondary | ICD-10-CM | POA: Diagnosis not present

## 2016-03-07 DIAGNOSIS — M189 Osteoarthritis of first carpometacarpal joint, unspecified: Secondary | ICD-10-CM | POA: Diagnosis not present

## 2016-03-10 ENCOUNTER — Encounter (HOSPITAL_COMMUNITY): Payer: Self-pay | Admitting: Emergency Medicine

## 2016-03-10 ENCOUNTER — Emergency Department (HOSPITAL_COMMUNITY)
Admission: EM | Admit: 2016-03-10 | Discharge: 2016-03-10 | Disposition: A | Discharge status: Home / Self Care | Payer: Medicare Other | Attending: Dermatology | Admitting: Dermatology

## 2016-03-10 DIAGNOSIS — Z5321 Procedure and treatment not carried out due to patient leaving prior to being seen by health care provider: Secondary | ICD-10-CM | POA: Diagnosis not present

## 2016-03-10 DIAGNOSIS — Z87891 Personal history of nicotine dependence: Secondary | ICD-10-CM | POA: Diagnosis not present

## 2016-03-10 DIAGNOSIS — R21 Rash and other nonspecific skin eruption: Secondary | ICD-10-CM | POA: Insufficient documentation

## 2016-03-10 DIAGNOSIS — Z955 Presence of coronary angioplasty implant and graft: Secondary | ICD-10-CM | POA: Diagnosis not present

## 2016-03-10 DIAGNOSIS — I2510 Atherosclerotic heart disease of native coronary artery without angina pectoris: Secondary | ICD-10-CM | POA: Diagnosis not present

## 2016-03-10 DIAGNOSIS — I5032 Chronic diastolic (congestive) heart failure: Secondary | ICD-10-CM | POA: Insufficient documentation

## 2016-03-10 DIAGNOSIS — Z951 Presence of aortocoronary bypass graft: Secondary | ICD-10-CM | POA: Insufficient documentation

## 2016-03-10 DIAGNOSIS — Z950 Presence of cardiac pacemaker: Secondary | ICD-10-CM | POA: Insufficient documentation

## 2016-03-10 DIAGNOSIS — E039 Hypothyroidism, unspecified: Secondary | ICD-10-CM | POA: Insufficient documentation

## 2016-03-10 DIAGNOSIS — I130 Hypertensive heart and chronic kidney disease with heart failure and stage 1 through stage 4 chronic kidney disease, or unspecified chronic kidney disease: Secondary | ICD-10-CM | POA: Insufficient documentation

## 2016-03-10 DIAGNOSIS — E119 Type 2 diabetes mellitus without complications: Secondary | ICD-10-CM | POA: Insufficient documentation

## 2016-03-10 DIAGNOSIS — Z794 Long term (current) use of insulin: Secondary | ICD-10-CM | POA: Insufficient documentation

## 2016-03-10 DIAGNOSIS — Z7982 Long term (current) use of aspirin: Secondary | ICD-10-CM | POA: Insufficient documentation

## 2016-03-10 DIAGNOSIS — N182 Chronic kidney disease, stage 2 (mild): Secondary | ICD-10-CM | POA: Insufficient documentation

## 2016-03-10 NOTE — ED Triage Notes (Signed)
Pt. reports persistent generalized skin itching onset Sunday unrelieved by OTC Benadryl and Prednisone prescribed by her PCP this week . Respirations unlabored / no oral swelling . Denies fever or chills .

## 2016-03-10 NOTE — ED Notes (Signed)
Pt reports having "a pill at home that will stop the itching." Pt requesting to leave AMA

## 2016-03-14 DIAGNOSIS — B86 Scabies: Secondary | ICD-10-CM | POA: Diagnosis not present

## 2016-03-17 ENCOUNTER — Encounter (INDEPENDENT_AMBULATORY_CARE_PROVIDER_SITE_OTHER): Payer: Medicare Other | Admitting: Ophthalmology

## 2016-03-17 DIAGNOSIS — H35033 Hypertensive retinopathy, bilateral: Secondary | ICD-10-CM

## 2016-03-17 DIAGNOSIS — I10 Essential (primary) hypertension: Secondary | ICD-10-CM

## 2016-03-17 DIAGNOSIS — H43813 Vitreous degeneration, bilateral: Secondary | ICD-10-CM | POA: Diagnosis not present

## 2016-03-17 DIAGNOSIS — H353221 Exudative age-related macular degeneration, left eye, with active choroidal neovascularization: Secondary | ICD-10-CM | POA: Diagnosis not present

## 2016-03-30 DIAGNOSIS — N189 Chronic kidney disease, unspecified: Secondary | ICD-10-CM | POA: Diagnosis not present

## 2016-03-30 DIAGNOSIS — E119 Type 2 diabetes mellitus without complications: Secondary | ICD-10-CM | POA: Diagnosis not present

## 2016-03-30 DIAGNOSIS — M130 Polyarthritis, unspecified: Secondary | ICD-10-CM | POA: Diagnosis not present

## 2016-03-30 DIAGNOSIS — E039 Hypothyroidism, unspecified: Secondary | ICD-10-CM | POA: Diagnosis not present

## 2016-04-14 ENCOUNTER — Encounter (INDEPENDENT_AMBULATORY_CARE_PROVIDER_SITE_OTHER): Payer: Medicare Other | Admitting: Ophthalmology

## 2016-04-14 DIAGNOSIS — I10 Essential (primary) hypertension: Secondary | ICD-10-CM | POA: Diagnosis not present

## 2016-04-14 DIAGNOSIS — E11319 Type 2 diabetes mellitus with unspecified diabetic retinopathy without macular edema: Secondary | ICD-10-CM | POA: Diagnosis not present

## 2016-04-14 DIAGNOSIS — H43813 Vitreous degeneration, bilateral: Secondary | ICD-10-CM | POA: Diagnosis not present

## 2016-04-14 DIAGNOSIS — H353221 Exudative age-related macular degeneration, left eye, with active choroidal neovascularization: Secondary | ICD-10-CM | POA: Diagnosis not present

## 2016-04-14 DIAGNOSIS — H35033 Hypertensive retinopathy, bilateral: Secondary | ICD-10-CM

## 2016-04-14 DIAGNOSIS — E113293 Type 2 diabetes mellitus with mild nonproliferative diabetic retinopathy without macular edema, bilateral: Secondary | ICD-10-CM | POA: Diagnosis not present

## 2016-04-25 ENCOUNTER — Encounter: Payer: Self-pay | Admitting: Gastroenterology

## 2016-04-26 ENCOUNTER — Ambulatory Visit
Admission: RE | Admit: 2016-04-26 | Discharge: 2016-04-26 | Disposition: A | Discharge status: Home / Self Care | Payer: Medicare Other | Source: Ambulatory Visit | Attending: Family Medicine | Admitting: Family Medicine

## 2016-04-26 ENCOUNTER — Other Ambulatory Visit: Payer: Self-pay | Admitting: Family Medicine

## 2016-04-26 DIAGNOSIS — I10 Essential (primary) hypertension: Secondary | ICD-10-CM | POA: Diagnosis not present

## 2016-04-26 DIAGNOSIS — J209 Acute bronchitis, unspecified: Secondary | ICD-10-CM | POA: Diagnosis not present

## 2016-04-26 DIAGNOSIS — R05 Cough: Secondary | ICD-10-CM

## 2016-04-26 DIAGNOSIS — E0821 Diabetes mellitus due to underlying condition with diabetic nephropathy: Secondary | ICD-10-CM | POA: Diagnosis not present

## 2016-04-26 DIAGNOSIS — R059 Cough, unspecified: Secondary | ICD-10-CM

## 2016-05-03 ENCOUNTER — Ambulatory Visit (INDEPENDENT_AMBULATORY_CARE_PROVIDER_SITE_OTHER): Payer: Medicare Other | Admitting: *Deleted

## 2016-05-03 DIAGNOSIS — E039 Hypothyroidism, unspecified: Secondary | ICD-10-CM | POA: Diagnosis not present

## 2016-05-03 DIAGNOSIS — E0821 Diabetes mellitus due to underlying condition with diabetic nephropathy: Secondary | ICD-10-CM | POA: Diagnosis not present

## 2016-05-03 DIAGNOSIS — M130 Polyarthritis, unspecified: Secondary | ICD-10-CM | POA: Diagnosis not present

## 2016-05-03 DIAGNOSIS — I495 Sick sinus syndrome: Secondary | ICD-10-CM | POA: Diagnosis not present

## 2016-05-03 DIAGNOSIS — I10 Essential (primary) hypertension: Secondary | ICD-10-CM | POA: Diagnosis not present

## 2016-05-03 DIAGNOSIS — M1000 Idiopathic gout, unspecified site: Secondary | ICD-10-CM | POA: Diagnosis not present

## 2016-05-04 ENCOUNTER — Telehealth: Payer: Self-pay | Admitting: Cardiology

## 2016-05-04 NOTE — Telephone Encounter (Signed)
Spoke with pt and reminded pt of remote transmission that is due today. Pt verbalized understanding.   

## 2016-05-05 ENCOUNTER — Encounter: Payer: Self-pay | Admitting: Cardiology

## 2016-05-05 LAB — CUP PACEART REMOTE DEVICE CHECK
Battery Remaining Longevity: 100 mo
Brady Statistic AP VP Percent: 59 %
Brady Statistic AS VS Percent: 10 %
Date Time Interrogation Session: 20180124160412
Implantable Lead Implant Date: 20131126
Implantable Lead Implant Date: 20131126
Implantable Lead Location: 753859
Implantable Pulse Generator Implant Date: 20131126
Lead Channel Impedance Value: 410 Ohm
Lead Channel Impedance Value: 425 Ohm
Lead Channel Pacing Threshold Amplitude: 0.875 V
Lead Channel Setting Pacing Amplitude: 2 V
Lead Channel Setting Sensing Sensitivity: 2 mV
MDC IDC LEAD LOCATION: 753860
MDC IDC MSMT BATTERY IMPEDANCE: 257 Ohm
MDC IDC MSMT BATTERY VOLTAGE: 2.79 V
MDC IDC MSMT LEADCHNL RA PACING THRESHOLD AMPLITUDE: 0.5 V
MDC IDC MSMT LEADCHNL RA PACING THRESHOLD PULSEWIDTH: 0.4 ms
MDC IDC MSMT LEADCHNL RV PACING THRESHOLD PULSEWIDTH: 0.4 ms
MDC IDC SET LEADCHNL RV PACING AMPLITUDE: 2.5 V
MDC IDC SET LEADCHNL RV PACING PULSEWIDTH: 0.4 ms
MDC IDC STAT BRADY AP VS PERCENT: 20 %
MDC IDC STAT BRADY AS VP PERCENT: 11 %

## 2016-05-05 NOTE — Progress Notes (Signed)
Remote pacemaker transmission.   

## 2016-05-17 ENCOUNTER — Telehealth: Payer: Self-pay | Admitting: Cardiovascular Disease

## 2016-05-17 NOTE — Telephone Encounter (Signed)
New Message     Pt c/o swelling: STAT is pt has developed SOB within 24 hours 1. How long have you been experiencing swelling? A week   2. Where is the swelling located? Feet and legs  3.  Are you currently taking a "fluid pill"? Yes   4.  Are you currently SOB? No   5.  Have you traveled recently? No   Pt is on prednisone and has gained 3.6lbs in a week, please follow up with pt, you do not need to call them back unless you have questions

## 2016-05-19 NOTE — Telephone Encounter (Signed)
The insurance nurse called  To let MD know that pt that pt had gained 3.6 lbs and have experience an increase in LE swelling for the last week.  I Spoke with pt she states that her PCP had D/C her Declofen oc 75 mg that she was taking for arthritis, because it was bothering her stomach and  prescribed prednisone 10 mg instead. Pt has been taking the prednisone 10 mg for 3 weeks. Pt has notice an increased LE swelling and that she has gained weight,and increase SOB  since starting the prednisone. Pt would like to know if it is the prednisone that is causing the increased swelling. Pt taken Lasix 40 mg daily.

## 2016-05-19 NOTE — Telephone Encounter (Signed)
DISCUSSED WITH DR  Excell SeltzerOOPER .PER  DR COOPER HAVE PT INCREASE LASIX TO 40 MG BID    FOR THREE DAYS  THEN  RESUME  1 DAILY . CALLED PT  WITH NEW INSTRUCTIONS.  PER  PT  STATED  HAD  NOT  BEEN TAKING LASIX   FOR SOME TIME  SAID  DR  BLAND HAD  DISCONTINUED . CALLED  AND  SPOKE WITH  DR  Tedra SenegalBLAND'S NURSE  AND SHE  SPOKE WITH  DR  Parke SimmersBLAND  AND  DR BLAND SAID SHE  HAD NOT STOPPED  LASIX . CALLED PT  BACK   AND  INSTRUCTED  PT  TO RESTART   LASIX  40 MG   DAILY   AND  TO MONITOR IF NO IMPROVEMENT   CALL BACK . DR Virgil BenedictOOPER AWARE. PT VERBALIZED UNDERSTANDING./CY

## 2016-05-24 ENCOUNTER — Telehealth: Payer: Self-pay | Admitting: Cardiovascular Disease

## 2016-05-24 DIAGNOSIS — I5032 Chronic diastolic (congestive) heart failure: Secondary | ICD-10-CM

## 2016-05-24 NOTE — Telephone Encounter (Signed)
I spoke with the pt and she states her swelling has gone down tremendously and weight is down 6 lbs.  The pt said she is having cramps in her hands and legs and I instructed her to come into the office tomorrow to have a BMP drawn. Pt agreed with plan.  I left a voicemail for [facility] nurse with update on patient.

## 2016-05-24 NOTE — Telephone Encounter (Signed)
New message     Please call pt, and check in with her: Did you tell the pt to restart her lasik?  Pt states that you were going to follow up with her in 3 days and no one has called , pt has lost 6lbs and is having cramping in hands and legs

## 2016-05-25 ENCOUNTER — Other Ambulatory Visit: Payer: Medicare Other | Admitting: *Deleted

## 2016-05-25 DIAGNOSIS — I5032 Chronic diastolic (congestive) heart failure: Secondary | ICD-10-CM | POA: Diagnosis not present

## 2016-05-25 LAB — BASIC METABOLIC PANEL
BUN / CREAT RATIO: 29 — AB (ref 12–28)
BUN: 34 mg/dL — AB (ref 8–27)
CO2: 28 mmol/L (ref 18–29)
CREATININE: 1.17 mg/dL — AB (ref 0.57–1.00)
Calcium: 9.9 mg/dL (ref 8.7–10.3)
Chloride: 98 mmol/L (ref 96–106)
GFR calc non Af Amer: 44 mL/min/{1.73_m2} — ABNORMAL LOW (ref >59)
GFR, EST AFRICAN AMERICAN: 50 mL/min/{1.73_m2} — AB (ref >59)
GLUCOSE: 83 mg/dL (ref 65–99)
Potassium: 4 mmol/L (ref 3.5–5.2)
SODIUM: 142 mmol/L (ref 134–144)

## 2016-05-25 NOTE — Telephone Encounter (Signed)
I spoke with the pt and made her aware of BMP results. Potassium was normal. I advised the pt to continue her current dosage of Furosemide 40mg  daily.

## 2016-05-26 ENCOUNTER — Encounter (INDEPENDENT_AMBULATORY_CARE_PROVIDER_SITE_OTHER): Payer: Medicare Other | Admitting: Ophthalmology

## 2016-05-31 ENCOUNTER — Other Ambulatory Visit: Payer: Self-pay

## 2016-05-31 DIAGNOSIS — I5032 Chronic diastolic (congestive) heart failure: Secondary | ICD-10-CM

## 2016-05-31 MED ORDER — CLOPIDOGREL BISULFATE 75 MG PO TABS: ORAL_TABLET | ORAL | 0 refills | Status: DC

## 2016-06-01 ENCOUNTER — Encounter (INDEPENDENT_AMBULATORY_CARE_PROVIDER_SITE_OTHER): Payer: Medicare Other | Admitting: Ophthalmology

## 2016-06-01 DIAGNOSIS — H353221 Exudative age-related macular degeneration, left eye, with active choroidal neovascularization: Secondary | ICD-10-CM | POA: Diagnosis not present

## 2016-06-01 DIAGNOSIS — E11319 Type 2 diabetes mellitus with unspecified diabetic retinopathy without macular edema: Secondary | ICD-10-CM

## 2016-06-01 DIAGNOSIS — E113293 Type 2 diabetes mellitus with mild nonproliferative diabetic retinopathy without macular edema, bilateral: Secondary | ICD-10-CM

## 2016-06-01 DIAGNOSIS — H35033 Hypertensive retinopathy, bilateral: Secondary | ICD-10-CM

## 2016-06-01 DIAGNOSIS — I10 Essential (primary) hypertension: Secondary | ICD-10-CM

## 2016-06-01 DIAGNOSIS — H43813 Vitreous degeneration, bilateral: Secondary | ICD-10-CM

## 2016-06-06 DIAGNOSIS — I10 Essential (primary) hypertension: Secondary | ICD-10-CM | POA: Diagnosis not present

## 2016-06-06 DIAGNOSIS — M1000 Idiopathic gout, unspecified site: Secondary | ICD-10-CM | POA: Diagnosis not present

## 2016-06-06 DIAGNOSIS — I130 Hypertensive heart and chronic kidney disease with heart failure and stage 1 through stage 4 chronic kidney disease, or unspecified chronic kidney disease: Secondary | ICD-10-CM | POA: Diagnosis not present

## 2016-06-06 DIAGNOSIS — E119 Type 2 diabetes mellitus without complications: Secondary | ICD-10-CM | POA: Diagnosis not present

## 2016-06-20 ENCOUNTER — Other Ambulatory Visit: Payer: Self-pay | Admitting: Physician Assistant

## 2016-06-23 DIAGNOSIS — E039 Hypothyroidism, unspecified: Secondary | ICD-10-CM | POA: Diagnosis not present

## 2016-06-23 DIAGNOSIS — I130 Hypertensive heart and chronic kidney disease with heart failure and stage 1 through stage 4 chronic kidney disease, or unspecified chronic kidney disease: Secondary | ICD-10-CM | POA: Diagnosis not present

## 2016-06-23 DIAGNOSIS — M1009 Idiopathic gout, multiple sites: Secondary | ICD-10-CM | POA: Diagnosis not present

## 2016-06-23 DIAGNOSIS — R0602 Shortness of breath: Secondary | ICD-10-CM | POA: Diagnosis not present

## 2016-06-29 ENCOUNTER — Encounter (INDEPENDENT_AMBULATORY_CARE_PROVIDER_SITE_OTHER): Payer: Medicare Other | Admitting: Ophthalmology

## 2016-06-30 ENCOUNTER — Encounter (INDEPENDENT_AMBULATORY_CARE_PROVIDER_SITE_OTHER): Payer: Medicare Other | Admitting: Ophthalmology

## 2016-06-30 DIAGNOSIS — H353221 Exudative age-related macular degeneration, left eye, with active choroidal neovascularization: Secondary | ICD-10-CM | POA: Diagnosis not present

## 2016-06-30 DIAGNOSIS — H43813 Vitreous degeneration, bilateral: Secondary | ICD-10-CM

## 2016-06-30 DIAGNOSIS — I10 Essential (primary) hypertension: Secondary | ICD-10-CM | POA: Diagnosis not present

## 2016-06-30 DIAGNOSIS — E113293 Type 2 diabetes mellitus with mild nonproliferative diabetic retinopathy without macular edema, bilateral: Secondary | ICD-10-CM

## 2016-06-30 DIAGNOSIS — E11319 Type 2 diabetes mellitus with unspecified diabetic retinopathy without macular edema: Secondary | ICD-10-CM

## 2016-06-30 DIAGNOSIS — H35033 Hypertensive retinopathy, bilateral: Secondary | ICD-10-CM

## 2016-07-06 ENCOUNTER — Ambulatory Visit (INDEPENDENT_AMBULATORY_CARE_PROVIDER_SITE_OTHER): Payer: Medicare Other | Admitting: Podiatry

## 2016-07-06 ENCOUNTER — Encounter: Payer: Self-pay | Admitting: Podiatry

## 2016-07-06 VITALS — BP 141/69 | HR 78

## 2016-07-06 DIAGNOSIS — L84 Corns and callosities: Secondary | ICD-10-CM

## 2016-07-06 DIAGNOSIS — M79676 Pain in unspecified toe(s): Secondary | ICD-10-CM

## 2016-07-06 DIAGNOSIS — B351 Tinea unguium: Secondary | ICD-10-CM | POA: Diagnosis not present

## 2016-07-06 NOTE — Progress Notes (Signed)
   Subjective:    Patient ID: [name], female    DOB: 1933/03/21, 83 y.o.   MRN: [id number]  HPI this patient presents to the office with chief complaint of long thick painful nails. Patient  has not been seen in this office for over 2 years.  She says the nails are painful walking and wearing her shoes. She also has a painful corn on the second toe left foot. She also admits to a painful corn on the second toe left foot. She presents the office today for an evaluation and treatment of her diabetic feet    Review of Systems  All other systems reviewed and are negative.      Objective:   Physical Exam GENERAL APPEARANCE: Alert, conversant. Appropriately groomed. No acute distress.  VASCULAR: Pedal pulses are  palpable at  [facility] and PT bilateral.  Capillary refill time is immediate to all digits,  Normal temperature gradient.    NEUROLOGIC: sensation is normal to 5.07 monofilament at 5/5 sites bilateral.  Light touch is intact bilateral, Muscle strength normal.  MUSCULOSKELETAL: acceptable muscle strength, tone and stability bilateral.  Intrinsic muscluature intact bilateral.  HAV  B/L Contracted second toe left foot.  DERMATOLOGIC: skin color, texture, and turgor are within normal limits.  No preulcerative lesions or ulcers  are seen, no interdigital maceration noted.  No open lesions present.  Corn second toe medial aspect left foot. No drainage noted. Swelling  B/L.  NAILS  Thick disfigured discolored nails both feet.         Assessment & Plan:  Onychomycosis  B/L  DM  Contracted second digit with corn. Debridement of long thick painful nails.  Debridement of corn  Padding dispensed.  RTC 3 months.   Helane GuntherGregory Madilyne Tadlock DPM

## 2016-07-19 ENCOUNTER — Other Ambulatory Visit: Payer: Self-pay | Admitting: Cardiovascular Disease

## 2016-07-19 DIAGNOSIS — I5032 Chronic diastolic (congestive) heart failure: Secondary | ICD-10-CM

## 2016-07-22 ENCOUNTER — Encounter: Payer: Self-pay | Admitting: Podiatry

## 2016-07-22 ENCOUNTER — Ambulatory Visit (INDEPENDENT_AMBULATORY_CARE_PROVIDER_SITE_OTHER): Payer: Medicare Other | Admitting: Podiatry

## 2016-07-22 DIAGNOSIS — M779 Enthesopathy, unspecified: Secondary | ICD-10-CM | POA: Diagnosis not present

## 2016-07-22 DIAGNOSIS — L84 Corns and callosities: Secondary | ICD-10-CM

## 2016-07-22 MED ORDER — TRIAMCINOLONE ACETONIDE 10 MG/ML IJ SUSP
10.0000 mg | Freq: Once | INTRAMUSCULAR | Status: AC
  Administered 2016-07-22: 10 mg

## 2016-07-24 NOTE — Progress Notes (Signed)
Subjective:     Patient ID: [name], female   DOB: [birth year], 83 y.o.   MRN: [id number]  HPI patient presents with quite a bit of pain in the fourth toe left foot with fluid buildup noted   Review of Systems     Objective:   Physical Exam Neurovascular status intact with inflammatory changes with interphalangeal joint capsulitis noted    Assessment:     Chronic inflammatory capsulitis of the digit    Plan:     Sterile prep done and carefully injected the interphalangeal joint with 2 mg [place] some Kenalog and applied padding and reappoint as needed and may ultimately require surgery for condition

## 2016-08-02 ENCOUNTER — Ambulatory Visit (INDEPENDENT_AMBULATORY_CARE_PROVIDER_SITE_OTHER): Payer: Medicare Other | Admitting: *Deleted

## 2016-08-02 DIAGNOSIS — I495 Sick sinus syndrome: Secondary | ICD-10-CM

## 2016-08-03 NOTE — Progress Notes (Signed)
Remote pacemaker transmission.   

## 2016-08-04 LAB — CUP PACEART REMOTE DEVICE CHECK
Battery Impedance: 281 Ohm
Battery Remaining Longevity: 99 mo
Brady Statistic AP VP Percent: 60 %
Brady Statistic AS VS Percent: 8 %
Date Time Interrogation Session: 20180425150722
Implantable Lead Implant Date: 20131126
Implantable Lead Implant Date: 20131126
Implantable Lead Location: 753859
Implantable Lead Model: 5076
Implantable Pulse Generator Implant Date: 20131126
Lead Channel Impedance Value: 439 Ohm
Lead Channel Impedance Value: 478 Ohm
Lead Channel Setting Pacing Amplitude: 2 V
Lead Channel Setting Pacing Pulse Width: 0.4 ms
Lead Channel Setting Sensing Sensitivity: 2 mV
MDC IDC LEAD LOCATION: 753860
MDC IDC MSMT BATTERY VOLTAGE: 2.79 V
MDC IDC MSMT LEADCHNL RA PACING THRESHOLD AMPLITUDE: 0.5 V
MDC IDC MSMT LEADCHNL RA PACING THRESHOLD PULSEWIDTH: 0.4 ms
MDC IDC MSMT LEADCHNL RV PACING THRESHOLD AMPLITUDE: 0.875 V
MDC IDC MSMT LEADCHNL RV PACING THRESHOLD PULSEWIDTH: 0.4 ms
MDC IDC SET LEADCHNL RV PACING AMPLITUDE: 2.5 V
MDC IDC STAT BRADY AP VS PERCENT: 21 %
MDC IDC STAT BRADY AS VP PERCENT: 11 %

## 2016-08-05 ENCOUNTER — Encounter: Payer: Self-pay | Admitting: Cardiology

## 2016-08-11 ENCOUNTER — Encounter (INDEPENDENT_AMBULATORY_CARE_PROVIDER_SITE_OTHER): Payer: Medicare Other | Admitting: Ophthalmology

## 2016-08-11 DIAGNOSIS — M130 Polyarthritis, unspecified: Secondary | ICD-10-CM | POA: Diagnosis not present

## 2016-08-11 DIAGNOSIS — E119 Type 2 diabetes mellitus without complications: Secondary | ICD-10-CM | POA: Diagnosis not present

## 2016-08-11 DIAGNOSIS — E039 Hypothyroidism, unspecified: Secondary | ICD-10-CM | POA: Diagnosis not present

## 2016-08-11 DIAGNOSIS — I1310 Hypertensive heart and chronic kidney disease without heart failure, with stage 1 through stage 4 chronic kidney disease, or unspecified chronic kidney disease: Secondary | ICD-10-CM | POA: Diagnosis not present

## 2016-08-15 ENCOUNTER — Encounter (INDEPENDENT_AMBULATORY_CARE_PROVIDER_SITE_OTHER): Payer: Medicare Other | Admitting: Ophthalmology

## 2016-08-15 DIAGNOSIS — H35033 Hypertensive retinopathy, bilateral: Secondary | ICD-10-CM | POA: Diagnosis not present

## 2016-08-15 DIAGNOSIS — I10 Essential (primary) hypertension: Secondary | ICD-10-CM | POA: Diagnosis not present

## 2016-08-15 DIAGNOSIS — H353221 Exudative age-related macular degeneration, left eye, with active choroidal neovascularization: Secondary | ICD-10-CM | POA: Diagnosis not present

## 2016-08-15 DIAGNOSIS — E113293 Type 2 diabetes mellitus with mild nonproliferative diabetic retinopathy without macular edema, bilateral: Secondary | ICD-10-CM | POA: Diagnosis not present

## 2016-08-15 DIAGNOSIS — H43813 Vitreous degeneration, bilateral: Secondary | ICD-10-CM

## 2016-08-15 DIAGNOSIS — E11319 Type 2 diabetes mellitus with unspecified diabetic retinopathy without macular edema: Secondary | ICD-10-CM | POA: Diagnosis not present

## 2016-09-13 DIAGNOSIS — I10 Essential (primary) hypertension: Secondary | ICD-10-CM | POA: Diagnosis not present

## 2016-09-15 DIAGNOSIS — R600 Localized edema: Secondary | ICD-10-CM | POA: Diagnosis not present

## 2016-09-15 DIAGNOSIS — L0390 Cellulitis, unspecified: Secondary | ICD-10-CM | POA: Diagnosis not present

## 2016-09-19 DIAGNOSIS — H35361 Drusen (degenerative) of macula, right eye: Secondary | ICD-10-CM | POA: Diagnosis not present

## 2016-09-19 DIAGNOSIS — H353122 Nonexudative age-related macular degeneration, left eye, intermediate dry stage: Secondary | ICD-10-CM | POA: Diagnosis not present

## 2016-09-19 DIAGNOSIS — H353222 Exudative age-related macular degeneration, left eye, with inactive choroidal neovascularization: Secondary | ICD-10-CM | POA: Diagnosis not present

## 2016-09-19 DIAGNOSIS — H353112 Nonexudative age-related macular degeneration, right eye, intermediate dry stage: Secondary | ICD-10-CM | POA: Diagnosis not present

## 2016-09-27 ENCOUNTER — Other Ambulatory Visit: Payer: Self-pay | Admitting: Cardiology

## 2016-09-27 DIAGNOSIS — I5032 Chronic diastolic (congestive) heart failure: Secondary | ICD-10-CM

## 2016-10-01 NOTE — Progress Notes (Deleted)
Electrophysiology Office Note Date: 10/01/2016  ID:  [name], DOB 13-Dec-1932, MRN [id number]  PCP: Lucianne Lei, MD Primary Cardiologist: Burt Knack Electrophysiologist: Allred  CC: Pacemaker follow-up  [name] is a 83 y.o. female seen today for Dr Rayann Heman.  She presents today for routine electrophysiology followup.  Since last being seen in our clinic, the patient reports doing very well.  She denies chest pain, palpitations, dyspnea, PND, orthopnea, nausea, vomiting, dizziness, syncope, edema, weight gain, or early satiety.  Device History: MDT dual chamber PPM implanted 2013 for SSS   Past Medical History:  Diagnosis Date  . Allergic rhinitis   . Anemia   . Anxiety   . Barrett esophagus   . CAD (coronary artery disease) 2009   a. Multivessel s/p PCI w/DES 2009 // b. s/p CABG 2011  //  c. LHC 8/15: pLAD 95 ISR, LCx 100, pOM1 40, dRCA 100, S-OM1/OM2 ok, S-D1 ok, S-PDA ok, L-LAD ok, EF 60%  . Carotid artery disease ([place])    a. Carotid US 2/70: RICA 6-23%; LICA 76-28% >> FU 1 year  //  b. Carotid US 9/17: R 1-39%, L 40-59% >> FU 1 year  . Chronic diastolic heart failure ([place])   . CKD (chronic kidney disease), stage II    GFR 60-89 ml/min  . Depression   . Disc disease, degenerative, cervical   . Diverticulosis   . Gastroparesis   . GERD (gastroesophageal reflux disease)   . Gout   . H/O hiatal hernia   . Helicobacter pylori gastritis   . History of echocardiogram    a. Echo 11/13: EF 55% to 60%. Grade 2 diastolic dysfunction, MAC, trivial MR, mild LAE, normal RVSF, mild RAE, PASP 39 mmHg  //  b. Echo 4/17: EF 55-60%, normal wall motion, trivial AI, MAC, moderate LAE, mild RVE, PASP 35 mmHg  . History of thrombocytopenia   . HTN (hypertension)   . Hyperlipidemia   . Hypothyroidism   . LBP (low back pain)    Lumbar disc disease/lumbar spinal stenosis  . Morbid obesity ([place])   . Myocardial infarction ([place])   . Osteoarthritis   . Osteopenia   . PVD  (peripheral vascular disease) ([place])   . Sick sinus syndrome [facility])    MDT Dual-chamber PPM implant 02/2012  . Type II or unspecified type diabetes mellitus without mention of complication, not stated as uncontrolled    Past Surgical History:  Procedure Laterality Date  . ABDOMINAL HYSTERECTOMY    . CARDIAC CATHETERIZATION     2011  DR COOPER (APPT NEXT WEEK)  . CHOLECYSTECTOMY    . CORONARY ARTERY BYPASS GRAFT  2011   LIMA-LAD, SVG-DIAG, SVG-OM1-OM2, SVG-PDA  . CORONARY STENT PLACEMENT     Drug-eluting stent to the left anterior descending, circumflex and right coronary artery in Jan 2009  . EYE SURGERY     BIL CATARACT REMOVAL 06/2010  . LEFT HEART CATHETERIZATION WITH CORONARY ANGIOGRAM N/A 12/02/2013   Procedure: LEFT HEART CATHETERIZATION WITH CORONARY ANGIOGRAM;  Surgeon: Sinclair Grooms, MD;  [facility] LAB;  Service: Cardiovascular;  Laterality: N/A;  . OVARIAN CYST REMOVAL    . PACEMAKER INSERTION  03/06/12   MDT Adapta L implanted by Dr Rayann Heman for SSS  . PERMANENT PACEMAKER INSERTION N/A 03/06/2012   Procedure: PERMANENT PACEMAKER INSERTION;  Surgeon: Thompson Grayer, MD;  Location: [facility] CATH LAB;  Service: Cardiovascular;  Laterality: N/A;  . SHOULDER ARTHROSCOPY  06/16/2011   Procedure: ARTHROSCOPY SHOULDER;  Surgeon: Sharmon Revere, MD;  Location: [place];  Service: Orthopedics;  Laterality: Left;  LEFT SHOULDER ARTHROSCOPY ACROMIALPLASTY, POSSIBLE MINI OPEN CUFF REPAIR   . TUBAL LIGATION      Current Outpatient Prescriptions  Medication Sig Dispense Refill  . acetaminophen (TYLENOL) 500 MG tablet Take 500 mg by mouth 2 (two) times daily as needed for pain.     Marland Kitchen allopurinol (ZYLOPRIM) 100 MG tablet Take 100 mg by mouth daily.    Marland Kitchen amLODipine (NORVASC) 10 MG tablet Take 1 tablet (10 mg total) by mouth at bedtime. *Please call and schedule a one year follow up appointment* 90 tablet 0  . aspirin 81 MG tablet Take 81 mg by mouth daily.    Marland Kitchen atorvastatin (LIPITOR) 20 MG  tablet Take 1 tablet (20 mg total) by mouth daily. 90 tablet 3  . azelastine (OPTIVAR) 0.05 % ophthalmic solution Place 1 drop into both eyes 2 (two) times daily.    . carvedilol (COREG) 3.125 MG tablet Take 1 tablet (3.125 mg total) by mouth 2 (two) times daily. 180 tablet 3  . chlorthalidone (HYGROTON) 25 MG tablet Take 25 mg by mouth daily.     . clopidogrel (PLAVIX) 75 MG tablet TAKE 1 TABLET BY MOUTH  DAILY 30 tablet 0  . colchicine 0.6 MG tablet Take 0.6 mg by mouth daily as needed (gout).     Marland Kitchen diclofenac (VOLTAREN) 75 MG EC tablet Take 75 mg by mouth 2 (two) times daily.     . furosemide (LASIX) 40 MG tablet Take 40 mg by mouth daily. Reported on 08/24/2015    . HUMULIN N 100 UNIT/ML injection Inject 10-45 Units into the skin 2 (two) times daily before a meal. 45 units in the morning and 10 units at bedtime    . hydrALAZINE (APRESOLINE) 25 MG tablet Take 25 mg by mouth 3 (three) times daily.     . iron polysaccharides (NIFEREX) 150 MG capsule Take 150 mg by mouth daily.      Marland Kitchen levothyroxine (SYNTHROID, LEVOTHROID) 100 MCG tablet Take 1 tablet (100 mcg total) by mouth daily. 30 tablet 0  . losartan (COZAAR) 100 MG tablet Take 1 tablet (100 mg total) by mouth daily. 90 tablet 3  . NITROSTAT 0.4 MG SL tablet DISSOLVE ONE TABLET UNDER THE TONGUE EVERY 5 MINUTES AS NEEDED FOR CHEST PAIN.  DO NOT EXCEED A TOTAL OF 3 DOSES IN 15 MINUTES 25 tablet 3  . ONE TOUCH ULTRA TEST test strip 1 each by Other route daily as needed (blood sugar).     . pioglitazone (ACTOS) 15 MG tablet Take 15 mg by mouth daily.      No current facility-administered medications for this visit.     Allergies:   Ciprofloxacin; Codeine; Hydrocodone; Penicillins; Shellfish allergy; Diltiazem hcl; Sulfonamide derivatives; Lovastatin; and Metformin   Social History: Social History   Social History  . Marital status: Widowed    Spouse name: N/A  . Number of children: N/A  . Years of education: N/A   Occupational History    . RETIRED LPN    Social History Main Topics  . Smoking status: Former Research scientist (life sciences)  . Smokeless tobacco: Never Used     Comment: quit 30 yrs ago  . Alcohol use No  . Drug use: No  . Sexual activity: Not on file   Other Topics Concern  . Not on file   Social History Narrative   Widowed 2004.., Lives alone..Family history is negative for premature  coronary artery disease. Mother died at age 59 with heart disease in her later years, father died at age 49 from a stroke.Marland KitchenShe  has 8 siblings, none of whom have coronary artery disease.          Family History: Family History  Problem Relation Age of Onset  . Diabetes Mother   . Hypertension Mother   . Heart attack Mother   . Stroke Father   . Coronary artery disease Other      Review of Systems: All other systems reviewed and are otherwise negative except as noted above.   Physical Exam: VS:  There were no vitals taken for this visit. , BMI There is no height or weight on file to calculate BMI.  GEN- The patient is well appearing, alert and oriented x 3 today.   HEENT: normocephalic, atraumatic; sclera clear, conjunctiva pink; hearing intact; oropharynx clear; neck supple, no JVP Lymph- no cervical lymphadenopathy Lungs- Clear to ausculation bilaterally, normal work of breathing.  No wheezes, rales, rhonchi Heart- Regular rate and rhythm, no murmurs, rubs or gallops, PMI not laterally displaced GI- soft, non-tender, non-distended, bowel sounds present, no hepatosplenomegaly Extremities- no clubbing, cyanosis, or edema; DP/PT/radial pulses 2+ bilaterally MS- no significant deformity or atrophy Skin- warm and dry, no rash or lesion; PPM pocket well healed Psych- euthymic mood, full affect Neuro- strength and sensation are intact  PPM Interrogation- reviewed in detail today,  See PACEART report  EKG:  EKG is ordered today. The ekg ordered today shows ***  Recent Labs: 11/22/2015: B Natriuretic Peptide 75.7 11/24/2015:  Hemoglobin 11.3; Platelets 199 05/25/2016: BUN 34; Creatinine, Ser 1.17; Potassium 4.0; Sodium 142   Wt Readings from Last 3 Encounters:  03/10/16 235 lb (106.6 kg)  11/23/15 224 lb 4.8 oz (101.7 kg)  07/13/15 236 lb 12.8 oz (107.4 kg)     Other studies Reviewed: Additional studies/ records that were reviewed today include:Dr Burt Knack and Dr Jackalyn Lombard office notes  Assessment and Plan:  1.  Symptomatic bradycardia Normal PPM function See Pace Art report No changes today  2.  HTN Stable No change required today  3.  CAD No recent ischemic symptoms Continue current therapy   4.  Chronic diastolic heart failure Stable No change required today    Current medicines are reviewed at length with the patient today.   The patient {ACTIONS; HAS/DOES NOT HAVE:19233} concerns regarding her medicines.  The following changes were made today:  {NONE DEFAULTED:18576::"none"}  Labs/ tests ordered today include: *** No orders of the defined types were placed in this encounter.    Disposition:   Follow up with [facility], Dr Rayann Heman 1 year, Dr Burt Knack 6 months (overdue)   Signed, Chanetta Marshall, NP 10/01/2016 8:45 PM  [place] [address] [phone] (office) [phone] (fax

## 2016-10-03 ENCOUNTER — Encounter (INDEPENDENT_AMBULATORY_CARE_PROVIDER_SITE_OTHER): Payer: Medicare Other | Admitting: Ophthalmology

## 2016-10-05 ENCOUNTER — Encounter: Payer: Medicare Other | Admitting: Nurse Practitioner

## 2016-10-05 DIAGNOSIS — I130 Hypertensive heart and chronic kidney disease with heart failure and stage 1 through stage 4 chronic kidney disease, or unspecified chronic kidney disease: Secondary | ICD-10-CM | POA: Diagnosis not present

## 2016-10-05 DIAGNOSIS — I1310 Hypertensive heart and chronic kidney disease without heart failure, with stage 1 through stage 4 chronic kidney disease, or unspecified chronic kidney disease: Secondary | ICD-10-CM | POA: Diagnosis not present

## 2016-10-06 ENCOUNTER — Emergency Department (HOSPITAL_COMMUNITY): Payer: Medicare Other

## 2016-10-06 ENCOUNTER — Encounter (HOSPITAL_COMMUNITY): Payer: Self-pay

## 2016-10-06 ENCOUNTER — Emergency Department (HOSPITAL_COMMUNITY)
Admission: EM | Admit: 2016-10-06 | Discharge: 2016-10-07 | Disposition: A | Discharge status: Home / Self Care | Payer: Medicare Other | Attending: Emergency Medicine | Admitting: Emergency Medicine

## 2016-10-06 DIAGNOSIS — R030 Elevated blood-pressure reading, without diagnosis of hypertension: Secondary | ICD-10-CM | POA: Diagnosis not present

## 2016-10-06 DIAGNOSIS — Z794 Long term (current) use of insulin: Secondary | ICD-10-CM | POA: Insufficient documentation

## 2016-10-06 DIAGNOSIS — E1122 Type 2 diabetes mellitus with diabetic chronic kidney disease: Secondary | ICD-10-CM | POA: Insufficient documentation

## 2016-10-06 DIAGNOSIS — N182 Chronic kidney disease, stage 2 (mild): Secondary | ICD-10-CM | POA: Diagnosis not present

## 2016-10-06 DIAGNOSIS — I130 Hypertensive heart and chronic kidney disease with heart failure and stage 1 through stage 4 chronic kidney disease, or unspecified chronic kidney disease: Secondary | ICD-10-CM | POA: Diagnosis not present

## 2016-10-06 DIAGNOSIS — Z955 Presence of coronary angioplasty implant and graft: Secondary | ICD-10-CM | POA: Insufficient documentation

## 2016-10-06 DIAGNOSIS — I2510 Atherosclerotic heart disease of native coronary artery without angina pectoris: Secondary | ICD-10-CM | POA: Diagnosis not present

## 2016-10-06 DIAGNOSIS — Z950 Presence of cardiac pacemaker: Secondary | ICD-10-CM | POA: Diagnosis not present

## 2016-10-06 DIAGNOSIS — Z87891 Personal history of nicotine dependence: Secondary | ICD-10-CM | POA: Diagnosis not present

## 2016-10-06 DIAGNOSIS — I252 Old myocardial infarction: Secondary | ICD-10-CM | POA: Insufficient documentation

## 2016-10-06 DIAGNOSIS — Z79899 Other long term (current) drug therapy: Secondary | ICD-10-CM | POA: Diagnosis not present

## 2016-10-06 DIAGNOSIS — I517 Cardiomegaly: Secondary | ICD-10-CM | POA: Diagnosis not present

## 2016-10-06 DIAGNOSIS — Z7982 Long term (current) use of aspirin: Secondary | ICD-10-CM | POA: Diagnosis not present

## 2016-10-06 DIAGNOSIS — I5032 Chronic diastolic (congestive) heart failure: Secondary | ICD-10-CM | POA: Diagnosis not present

## 2016-10-06 DIAGNOSIS — M79601 Pain in right arm: Secondary | ICD-10-CM | POA: Diagnosis not present

## 2016-10-06 DIAGNOSIS — M25511 Pain in right shoulder: Secondary | ICD-10-CM | POA: Insufficient documentation

## 2016-10-06 DIAGNOSIS — E039 Hypothyroidism, unspecified: Secondary | ICD-10-CM | POA: Diagnosis not present

## 2016-10-06 DIAGNOSIS — Z951 Presence of aortocoronary bypass graft: Secondary | ICD-10-CM | POA: Insufficient documentation

## 2016-10-06 LAB — CBC WITH DIFFERENTIAL/PLATELET
BASOS ABS: 0 10*3/uL (ref 0.0–0.1)
Basophils Relative: 0 %
Eosinophils Absolute: 0.3 10*3/uL (ref 0.0–0.7)
Eosinophils Relative: 2 %
HEMATOCRIT: 34 % — AB (ref 36.0–46.0)
Hemoglobin: 11.1 g/dL — ABNORMAL LOW (ref 12.0–15.0)
LYMPHS PCT: 17 %
Lymphs Abs: 2.3 10*3/uL (ref 0.7–4.0)
MCH: 30.6 pg (ref 26.0–34.0)
MCHC: 32.6 g/dL (ref 30.0–36.0)
MCV: 93.7 fL (ref 78.0–100.0)
Monocytes Absolute: 1.4 10*3/uL — ABNORMAL HIGH (ref 0.1–1.0)
Monocytes Relative: 10 %
NEUTROS ABS: 9.8 10*3/uL — AB (ref 1.7–7.7)
Neutrophils Relative %: 71 %
Platelets: 241 10*3/uL (ref 150–400)
RBC: 3.63 MIL/uL — AB (ref 3.87–5.11)
RDW: 13.8 % (ref 11.5–15.5)
WBC: 13.8 10*3/uL — AB (ref 4.0–10.5)

## 2016-10-06 LAB — COMPREHENSIVE METABOLIC PANEL
ALBUMIN: 3.5 g/dL (ref 3.5–5.0)
ALT: 15 U/L (ref 14–54)
AST: 18 U/L (ref 15–41)
Alkaline Phosphatase: 89 U/L (ref 38–126)
Anion gap: 8 (ref 5–15)
BUN: 40 mg/dL — AB (ref 6–20)
CHLORIDE: 107 mmol/L (ref 101–111)
CO2: 25 mmol/L (ref 22–32)
CREATININE: 1.26 mg/dL — AB (ref 0.44–1.00)
Calcium: 10.3 mg/dL (ref 8.9–10.3)
GFR calc non Af Amer: 39 mL/min — ABNORMAL LOW (ref >60)
GFR, EST AFRICAN AMERICAN: 45 mL/min — AB (ref >60)
GLUCOSE: 160 mg/dL — AB (ref 65–99)
Potassium: 3.9 mmol/L (ref 3.5–5.1)
SODIUM: 140 mmol/L (ref 135–145)
Total Bilirubin: 0.5 mg/dL (ref 0.3–1.2)
Total Protein: 7.1 g/dL (ref 6.5–8.1)

## 2016-10-06 LAB — PROTIME-INR
INR: 1
Prothrombin Time: 13.2 seconds (ref 11.4–15.2)

## 2016-10-06 LAB — CBG MONITORING, ED: Glucose-Capillary: 149 mg/dL — ABNORMAL HIGH (ref 65–99)

## 2016-10-06 MED ORDER — ACETAMINOPHEN 325 MG PO TABS
650.0000 mg | ORAL_TABLET | Freq: Once | ORAL | Status: AC
  Administered 2016-10-07: 650 mg via ORAL
  Filled 2016-10-06: qty 2

## 2016-10-06 NOTE — ED Triage Notes (Signed)
Per EMS, pt from home.  Pt c/o rt shoulder.  No injury.  Started at 3:30 am.  No neuro changes.  No chest pain.  Vitals:  170/66, hr 86, resp 16, cbg 221

## 2016-10-06 NOTE — ED Provider Notes (Signed)
[place] DEPT Provider Note   CSN: [id number] Arrival date & time: 10/06/16  1734     History   Chief Complaint Chief Complaint  Patient presents with  . Shoulder Pain    HPI [name] is a 83 y.o. female.  Patient with a history of CAD, GERD, CKD, HTN, HLD, carotid artery disease, presents with complaint of pain in the right UE that started during the night in her right lateral neck. The pain includes the entire right arm and includes weakness to where she feels she needs to move her right arm with the left hand. She has some pain in the right leg but no weakness. No falls. She denies chest pain, SOB, abdominal pain or headache. No visual changes, nausea, vomiting, recent illness or injury. She denies pain or weakness in the right UE in the past. Per family at bedside, no disorientation or confusion today. Grandson reports he called EMS because she complained of pain.   The history is provided by the patient and a relative. No language interpreter was used.  Shoulder Pain   Pertinent negatives include no numbness.    Past Medical History:  Diagnosis Date  . Allergic rhinitis   . Anemia   . Anxiety   . Barrett esophagus   . CAD (coronary artery disease) 2009   a. Multivessel s/p PCI w/DES 2009 // b. s/p CABG 2011  //  c. LHC 8/15: pLAD 95 ISR, LCx 100, pOM1 40, dRCA 100, S-OM1/OM2 ok, S-D1 ok, S-PDA ok, L-LAD ok, EF 60%  . Carotid artery disease ([place])    a. Carotid US 4/48: RICA 1-85%; LICA 63-14% >> FU 1 year  //  b. Carotid US 9/17: R 1-39%, L 40-59% >> FU 1 year  . Chronic diastolic heart failure ([place])   . CKD (chronic kidney disease), stage II    GFR 60-89 ml/min  . Depression   . Disc disease, degenerative, cervical   . Diverticulosis   . Gastroparesis   . GERD (gastroesophageal reflux disease)   . Gout   . H/O hiatal hernia   . Helicobacter pylori gastritis   . History of echocardiogram    a. Echo 11/13: EF 55% to 60%. Grade 2 diastolic dysfunction,  MAC, trivial MR, mild LAE, normal RVSF, mild RAE, PASP 39 mmHg  //  b. Echo 4/17: EF 55-60%, normal wall motion, trivial AI, MAC, moderate LAE, mild RVE, PASP 35 mmHg  . History of thrombocytopenia   . HTN (hypertension)   . Hyperlipidemia   . Hypothyroidism   . LBP (low back pain)    Lumbar disc disease/lumbar spinal stenosis  . Morbid obesity ([place])   . Myocardial infarction ([place])   . Osteoarthritis   . Osteopenia   . PVD (peripheral vascular disease) ([place])   . Sick sinus syndrome [facility])    MDT Dual-chamber PPM implant 02/2012  . Type II or unspecified type diabetes mellitus without mention of complication, not stated as uncontrolled     Patient Active Problem List   Diagnosis Date Noted  . Chest pain 11/23/2015  . Unstable angina ([place]) 11/29/2013  . Type I (juvenile type) diabetes mellitus with renal manifestations, not stated as uncontrolled(250.41) 04/27/2012  . DM (diabetes mellitus), type 2 with renal complications ([place]) 97/05/6376  . Acute renal failure ([place]) 03/05/2012  . Diarrhea 03/05/2012  . Dehydration 03/05/2012  . CKD (chronic kidney disease) stage 2, GFR 60-89 ml/min 03/05/2012  . Carotid artery disease ([place]) 08/11/2010  . Left shoulder  pain 07/08/2010  . Preventative health care 07/08/2010  . HOARSENESS 06/04/2010  . PRURITUS 05/11/2010  . ANEMIA-NOS 04/02/2010  . Chronic diastolic heart failure ([place]) 02/11/2010  . Acute on chronic diastolic heart failure ([place]) 11/26/2009  . SINUS BRADYCARDIA 10/30/2009  . ALLERGIC RHINITIS 09/11/2009  . BACK PAIN 09/11/2009  . MUSCLE STRAIN, RIGHT BUTTOCK 09/11/2009  . SHINGLES 05/11/2009  . SHOULDER PAIN, LEFT 12/29/2008  . Proteinuria 12/12/2008  . Abdominal pain, unspecified site 09/24/2007  . Coronary atherosclerosis 05/03/2007  . CHEST PAIN 04/20/2007  . DIZZINESS 02/28/2007  . ANXIETY 02/15/2007  . GERD 02/15/2007  . Gastroparesis 02/15/2007  . [place] DISEASE, CERVICAL 02/15/2007  . [place] DISEASE, LUMBAR 02/15/2007   . SPINAL STENOSIS, LUMBAR 02/15/2007  . PERIPHERAL EDEMA 02/15/2007  . Personal History of Other Diseases of Digestive Disease 02/15/2007  . HYPERLIPIDEMIA 01/01/2007  . GOUT 01/01/2007  . Morbid obesity ([place]) 01/01/2007  . DEPRESSION 01/01/2007  . PERIPHERAL VASCULAR DISEASE 01/01/2007  . DIVERTICULOSIS, COLON 01/01/2007  . OSTEOARTHRITIS 01/01/2007  . LOW BACK PAIN 01/01/2007  . OSTEOPENIA 01/01/2007  . Essential hypertension 10/26/2006    Past Surgical History:  Procedure Laterality Date  . ABDOMINAL HYSTERECTOMY    . CARDIAC CATHETERIZATION     2011  DR COOPER (APPT NEXT WEEK)  . CHOLECYSTECTOMY    . CORONARY ARTERY BYPASS GRAFT  2011   LIMA-LAD, SVG-DIAG, SVG-OM1-OM2, SVG-PDA  . CORONARY STENT PLACEMENT     Drug-eluting stent to the left anterior descending, circumflex and right coronary artery in Jan 2009  . EYE SURGERY     BIL CATARACT REMOVAL 06/2010  . LEFT HEART CATHETERIZATION WITH CORONARY ANGIOGRAM N/A 12/02/2013   Procedure: LEFT HEART CATHETERIZATION WITH CORONARY ANGIOGRAM;  Surgeon: Sinclair Grooms, MD;  Location: [facility] CATH LAB;  Service: Cardiovascular;  Laterality: N/A;  . OVARIAN CYST REMOVAL    . PACEMAKER INSERTION  03/06/12   MDT Adapta L implanted by Dr Rayann Heman for SSS  . PERMANENT PACEMAKER INSERTION N/A 03/06/2012   Procedure: PERMANENT PACEMAKER INSERTION;  Surgeon: Thompson Grayer, MD;  Location: [facility] CATH LAB;  Service: Cardiovascular;  Laterality: N/A;  . SHOULDER ARTHROSCOPY  06/16/2011   Procedure: ARTHROSCOPY SHOULDER;  Surgeon: Sharmon Revere, MD;  Location: [place];  Service: Orthopedics;  Laterality: Left;  LEFT SHOULDER ARTHROSCOPY ACROMIALPLASTY, POSSIBLE MINI OPEN CUFF REPAIR   . TUBAL LIGATION      OB History    No data available       Home Medications    Prior to Admission medications   Medication Sig Start Date End Date Taking? Authorizing Provider  acetaminophen (TYLENOL) 500 MG tablet Take 500 mg by mouth 2 (two) times daily as  needed for pain.     [provider]  allopurinol (ZYLOPRIM) 100 MG tablet Take 100 mg by mouth daily.    [provider]  amLODipine (NORVASC) 10 MG tablet Take 1 tablet (10 mg total) by mouth at bedtime. *Please call and schedule a one year follow up appointment* 06/21/16   Richardson Dopp T, PA-C  aspirin 81 MG tablet Take 81 mg by mouth daily.    [provider]  atorvastatin (LIPITOR) 20 MG tablet Take 1 tablet (20 mg total) by mouth daily. 12/15/15   Sherren Mocha, MD  azelastine (OPTIVAR) 0.05 % ophthalmic solution Place 1 drop into both eyes 2 (two) times daily. 10/26/15   [provider]  carvedilol (COREG) 3.125 MG tablet Take 1 tablet (3.125 mg total) by mouth  2 (two) times daily. 04/16/15   Sherren Mocha, MD  chlorthalidone (HYGROTON) 25 MG tablet Take 25 mg by mouth daily.  07/06/15   [provider]  clopidogrel (PLAVIX) 75 MG tablet TAKE 1 TABLET BY MOUTH  DAILY 09/28/16   Sherren Mocha, MD  colchicine 0.6 MG tablet Take 0.6 mg by mouth daily as needed (gout).     [provider]  diclofenac (VOLTAREN) 75 MG EC tablet Take 75 mg by mouth 2 (two) times daily.  01/21/13   [provider]  furosemide (LASIX) 40 MG tablet Take 40 mg by mouth daily. Reported on 08/24/2015    [provider]  HUMULIN N 100 UNIT/ML injection Inject 10-45 Units into the skin 2 (two) times daily before a meal. 45 units in the morning and 10 units at bedtime 07/06/15   [provider]  hydrALAZINE (APRESOLINE) 25 MG tablet Take 25 mg by mouth 3 (three) times daily.  07/06/15   [provider]  iron polysaccharides (NIFEREX) 150 MG capsule Take 150 mg by mouth daily.      [provider]  levothyroxine (SYNTHROID, LEVOTHROID) 100 MCG tablet Take 1 tablet (100 mcg total) by mouth daily. 03/13/12   Renato Shin, MD  losartan (COZAAR) 100 MG tablet Take 1 tablet (100 mg total) by mouth daily. 08/01/12   Sherren Mocha, MD    NITROSTAT 0.4 MG SL tablet DISSOLVE ONE TABLET UNDER THE TONGUE EVERY 5 MINUTES AS NEEDED FOR CHEST PAIN.  DO NOT EXCEED A TOTAL OF 3 DOSES IN 15 MINUTES 12/04/15   Sherren Mocha, MD  ONE TOUCH ULTRA TEST test strip 1 each by Other route daily as needed (blood sugar).  07/10/13   [provider]  pioglitazone (ACTOS) 15 MG tablet Take 15 mg by mouth daily.  07/06/15   [provider]    Family History Family History  Problem Relation Age of Onset  . Diabetes Mother   . Hypertension Mother   . Heart attack Mother   . Stroke Father   . Coronary artery disease Other     Social History Social History  Substance Use Topics  . Smoking status: Former Research scientist (life sciences)  . Smokeless tobacco: Never Used     Comment: quit 30 yrs ago  . Alcohol use No     Allergies   Ciprofloxacin; Codeine; Hydrocodone; Penicillins; Shellfish allergy; Diltiazem hcl; Sulfonamide derivatives; Lovastatin; and Metformin   Review of Systems Review of Systems  Constitutional: Negative for chills and fever.  HENT: Negative.   Eyes: Negative for visual disturbance.  Respiratory: Negative.  Negative for shortness of breath.   Cardiovascular: Negative.  Negative for chest pain.  Gastrointestinal: Negative.  Negative for abdominal pain, nausea and vomiting.  Genitourinary: Negative.   Musculoskeletal:       See HPI.  Skin: Negative.  Negative for color change.  Neurological: Positive for weakness. Negative for numbness and headaches.  Psychiatric/Behavioral: Negative for confusion.     Physical Exam Updated Vital Signs BP (!) 181/42 (BP Location: Left Arm)   Pulse 63   Temp 98.2 F (36.8 C) (Oral)   Resp 16   SpO2 100%   Physical Exam  Constitutional: She is oriented to person, place, and time. She appears well-developed and well-nourished.  HENT:  Head: Normocephalic.  Neck: Normal range of motion. Neck supple.  Cardiovascular: Normal rate and regular rhythm.   No carotid bruit.   Pulmonary/Chest: Effort normal and breath sounds normal. She has no wheezes. She  has no rales. She exhibits no tenderness.  Abdominal: Soft. Bowel sounds are normal. There is no tenderness. There is no rebound and no guarding.  Musculoskeletal: Normal range of motion.  Neurological: She is alert and oriented to person, place, and time.  CN's 3-12 grossly intact. Speech is clear and focused. No facial asymmetry. Right UE is weak to grip strength when compared to the left. No deficits of coordination. No sensory discrepancy.   Skin: Skin is warm and dry. No rash noted. No erythema.  Psychiatric: She has a normal mood and affect.     ED Treatments / Results  Labs (all labs ordered are listed, but only abnormal results are displayed) Labs Reviewed - No data to display  EKG  EKG Interpretation None       Radiology No results found.  Procedures Procedures (including critical care time)  Medications Ordered in ED Medications - No data to display   Initial Impression / Assessment and Plan / ED Course  I have reviewed the triage vital signs and the nursing notes.  Pertinent labs & imaging results that were available during my care of the patient were reviewed by me and considered in my medical decision making (see chart for details).     Patient with complaint of RUE pain since this morning. Worse with movement. Having lateral neck pain without midline cervical pain. No numbness.   Discussed with Dr. Ralene Bathe who has seen and evaluated the patient. Symptoms felt to be muscular in nature. She has Tylenol at home. She can be discharged home to follow up with PCP for recheck.   Final Clinical Impressions(s) / ED Diagnoses   Final diagnoses:  None   1. Acute pain of right shoulder  New Prescriptions New Prescriptions   No medications on file     Dennie Bible 10/07/16 0802    Quintella Reichert, MD 10/07/16 [phone]

## 2016-10-07 ENCOUNTER — Ambulatory Visit: Payer: Medicare Other | Admitting: Podiatry

## 2016-10-07 NOTE — Discharge Instructions (Signed)
Follow up with Dr. Parke SimmersBland tomorrow for recheck of right shoulder and neck pain. Return here with any worsening symptoms.

## 2016-10-11 ENCOUNTER — Ambulatory Visit: Payer: Medicare Other | Admitting: Podiatry

## 2016-10-18 DIAGNOSIS — J9801 Acute bronchospasm: Secondary | ICD-10-CM | POA: Diagnosis not present

## 2016-10-18 DIAGNOSIS — N189 Chronic kidney disease, unspecified: Secondary | ICD-10-CM | POA: Diagnosis not present

## 2016-10-18 DIAGNOSIS — I1310 Hypertensive heart and chronic kidney disease without heart failure, with stage 1 through stage 4 chronic kidney disease, or unspecified chronic kidney disease: Secondary | ICD-10-CM | POA: Diagnosis not present

## 2016-10-18 DIAGNOSIS — I10 Essential (primary) hypertension: Secondary | ICD-10-CM | POA: Diagnosis not present

## 2016-10-19 ENCOUNTER — Ambulatory Visit: Payer: Medicare Other | Admitting: Podiatry

## 2016-10-25 DIAGNOSIS — E119 Type 2 diabetes mellitus without complications: Secondary | ICD-10-CM | POA: Diagnosis not present

## 2016-10-25 DIAGNOSIS — I1310 Hypertensive heart and chronic kidney disease without heart failure, with stage 1 through stage 4 chronic kidney disease, or unspecified chronic kidney disease: Secondary | ICD-10-CM | POA: Diagnosis not present

## 2016-11-01 ENCOUNTER — Telehealth: Payer: Self-pay | Admitting: Cardiology

## 2016-11-01 ENCOUNTER — Other Ambulatory Visit: Payer: Self-pay | Admitting: Cardiovascular Disease

## 2016-11-01 ENCOUNTER — Encounter: Payer: Medicare Other | Admitting: *Deleted

## 2016-11-01 DIAGNOSIS — I5032 Chronic diastolic (congestive) heart failure: Secondary | ICD-10-CM

## 2016-11-01 NOTE — Telephone Encounter (Signed)
Attempted to confirm remote transmission with pt. No answer and was unable to leave a message.   

## 2016-11-02 ENCOUNTER — Other Ambulatory Visit: Payer: Self-pay | Admitting: Physician Assistant

## 2016-11-02 DIAGNOSIS — I5032 Chronic diastolic (congestive) heart failure: Secondary | ICD-10-CM

## 2016-11-02 MED ORDER — CARVEDILOL 3.125 MG PO TABS: 3.1250 mg | ORAL_TABLET | Freq: Two times a day (BID) | ORAL | 0 refills | Status: DC

## 2016-11-04 ENCOUNTER — Encounter: Payer: Self-pay | Admitting: Cardiology

## 2016-11-04 NOTE — Progress Notes (Signed)
Letter  

## 2016-11-05 NOTE — Progress Notes (Signed)
Electrophysiology Office Note Date: 11/09/2016  ID:  [name], DOB April 19, 1932, MRN [id number]  PCP: Lucianne Lei, MD Primary Cardiologist: Burt Knack Electrophysiologist: Allred  CC: Pacemaker follow-up  [name] is a 83 y.o. female seen today for Dr Rayann Heman.  She presents today for routine electrophysiology followup.  Since last being seen in our clinic, the patient reports doing relatively well.  Her gout is bothering her this morning.  She was recently started on Entresto by her PCP and has follow up labs scheduled for Friday. Her edema has improved since switching from Losartan.  She denies chest pain, palpitations, dyspnea, PND, orthopnea, nausea, vomiting, dizziness, syncope, edema, weight gain, or early satiety.  Device History: MDT dual chamber PPM implanted 2013 for SSS   Past Medical History:  Diagnosis Date  . Allergic rhinitis   . Anemia   . Anxiety   . Barrett esophagus   . CAD (coronary artery disease) 2009   a. Multivessel s/p PCI w/DES 2009 // b. s/p CABG 2011  //  c. LHC 8/15: pLAD 95 ISR, LCx 100, pOM1 40, dRCA 100, S-OM1/OM2 ok, S-D1 ok, S-PDA ok, L-LAD ok, EF 60%  . Carotid artery disease ([place])    a. Carotid US 8/56: RICA 3-14%; LICA 97-02% >> FU 1 year  //  b. Carotid US 9/17: R 1-39%, L 40-59% >> FU 1 year  . Chronic diastolic heart failure ([place])   . CKD (chronic kidney disease), stage II    GFR 60-89 ml/min  . Depression   . Disc disease, degenerative, cervical   . Diverticulosis   . Gastroparesis   . GERD (gastroesophageal reflux disease)   . Gout   . H/O hiatal hernia   . Helicobacter pylori gastritis   . History of echocardiogram    a. Echo 11/13: EF 55% to 60%. Grade 2 diastolic dysfunction, MAC, trivial MR, mild LAE, normal RVSF, mild RAE, PASP 39 mmHg  //  b. Echo 4/17: EF 55-60%, normal wall motion, trivial AI, MAC, moderate LAE, mild RVE, PASP 35 mmHg  . History of thrombocytopenia   . HTN (hypertension)   . Hyperlipidemia   .  Hypothyroidism   . LBP (low back pain)    Lumbar disc disease/lumbar spinal stenosis  . Morbid obesity ([place])   . Myocardial infarction ([place])   . Osteoarthritis   . Osteopenia   . PVD (peripheral vascular disease) ([place])   . Sick sinus syndrome [facility])    MDT Dual-chamber PPM implant 02/2012  . Type II or unspecified type diabetes mellitus without mention of complication, not stated as uncontrolled    Past Surgical History:  Procedure Laterality Date  . ABDOMINAL HYSTERECTOMY    . CARDIAC CATHETERIZATION     2011  DR COOPER (APPT NEXT WEEK)  . CHOLECYSTECTOMY    . CORONARY ARTERY BYPASS GRAFT  2011   LIMA-LAD, SVG-DIAG, SVG-OM1-OM2, SVG-PDA  . CORONARY STENT PLACEMENT     Drug-eluting stent to the left anterior descending, circumflex and right coronary artery in Jan 2009  . EYE SURGERY     BIL CATARACT REMOVAL 06/2010  . LEFT HEART CATHETERIZATION WITH CORONARY ANGIOGRAM N/A 12/02/2013   Procedure: LEFT HEART CATHETERIZATION WITH CORONARY ANGIOGRAM;  Surgeon: Sinclair Grooms, MD;  Location[facility] C[facility] LAB;  Service: Cardiovascular;  Laterality: N/A;  . OVARIAN CYST REMOVAL    . PACEMAKER INSERTION  03/06/12   MDT Adapta L implanted by Dr Rayann Heman for SSS  . PERMANENT PACEMAKER INSERTION N/A  03/06/2012   Procedure: PERMANENT PACEMAKER INSERTION;  Surgeon: Thompson Grayer, MD;  Location[facility] LAB;  Service: Cardiovascular;  Laterality: N/A;  . SHOULDER ARTHROSCOPY  06/16/2011   Procedure: ARTHROSCOPY SHOULDER;  Surgeon: Sharmon Revere, MD;  Location: [place];  Service: Orthopedics;  Laterality: Left;  LEFT SHOULDER ARTHROSCOPY ACROMIALPLASTY, POSSIBLE MINI OPEN CUFF REPAIR   . TUBAL LIGATION      Current Outpatient Prescriptions  Medication Sig Dispense Refill  . Fluticasone-Umeclidin-Vilant (TRELEGY ELLIPTA) 100-62.5-25 MCG/INH AEPB Inhale 1 puff into the lungs daily.    . sacubitril-valsartan (ENTRESTO) 24-26 MG Take 1 tablet by mouth 2 (two) times daily.    Marland Kitchen acetaminophen (TYLENOL) 500  MG tablet Take 500 mg by mouth 2 (two) times daily as needed for pain.     Marland Kitchen allopurinol (ZYLOPRIM) 100 MG tablet Take 100 mg by mouth daily.    Marland Kitchen amLODipine (NORVASC) 5 MG tablet Take 5 mg by mouth daily.     Marland Kitchen aspirin 81 MG tablet Take 81 mg by mouth daily.    Marland Kitchen atorvastatin (LIPITOR) 20 MG tablet Take 1 tablet (20 mg total) by mouth daily. 90 tablet 3  . azelastine (OPTIVAR) 0.05 % ophthalmic solution Place 1 drop into both eyes 2 (two) times daily.    . carvedilol (COREG) 3.125 MG tablet Take 1 tablet (3.125 mg total) by mouth 2 (two) times daily. 180 tablet 0  . chlorthalidone (HYGROTON) 25 MG tablet Take 25 mg by mouth daily.     . clopidogrel (PLAVIX) 75 MG tablet TAKE 1 TABLET BY MOUTH  DAILY 15 tablet 0  . colchicine 0.6 MG tablet Take 0.6 mg by mouth daily as needed (gout).     Marland Kitchen HUMULIN N 100 UNIT/ML injection Inject 10-45 Units into the skin 2 (two) times daily before a meal. 45 units in the morning and 10 units at bedtime    . hydrALAZINE (APRESOLINE) 25 MG tablet Take 25 mg by mouth 3 (three) times daily.     . iron polysaccharides (NIFEREX) 150 MG capsule Take 150 mg by mouth daily.      Marland Kitchen levothyroxine (SYNTHROID, LEVOTHROID) 100 MCG tablet Take 1 tablet (100 mcg total) by mouth daily. 30 tablet 0  . NITROSTAT 0.4 MG SL tablet DISSOLVE ONE TABLET UNDER THE TONGUE EVERY 5 MINUTES AS NEEDED FOR CHEST PAIN.  DO NOT EXCEED A TOTAL OF 3 DOSES IN 15 MINUTES 25 tablet 3  . ONE TOUCH ULTRA TEST test strip 1 each by Other route daily as needed (blood sugar).     . pioglitazone (ACTOS) 15 MG tablet Take 15 mg by mouth daily.     . SYMBICORT 80-4.5 MCG/ACT inhaler Inhale 2 puffs into the lungs daily as needed (sob wheezing).      No current facility-administered medications for this visit.     Allergies:   Ciprofloxacin; Codeine; Hydrocodone; Penicillins; Shellfish allergy; Diltiazem hcl; Sulfonamide derivatives; Lovastatin; and Metformin   Social History: Social History   Social  History  . Marital status: Widowed    Spouse name: N/A  . Number of children: N/A  . Years of education: N/A   Occupational History  . RETIRED LPN    Social History Main Topics  . Smoking status: Former Research scientist (life sciences)  . Smokeless tobacco: Never Used     Comment: quit 30 yrs ago  . Alcohol use No  . Drug use: No  . Sexual activity: Not on file   Other Topics Concern  . Not on  file   Social History Narrative   Widowed 2004.., Lives alone..Family history is negative for premature coronary artery disease. Mother died at age 95 with heart disease in her later years, father died at age 78 from a stroke.Marland KitchenShe  has 8 siblings, none of whom have coronary artery disease.          Family History: Family History  Problem Relation Age of Onset  . Diabetes Mother   . Hypertension Mother   . Heart attack Mother   . Stroke Father   . Coronary artery disease Other      Review of Systems: All other systems reviewed and are otherwise negative except as noted above.   Physical Exam: VS:  BP 122/64   Pulse 61   Ht 5' 7"  (1.702 m)   Wt 229 lb 12.8 oz (104.2 kg)   BMI 35.99 kg/m  , BMI Body mass index is 35.99 kg/m.  GEN- The patient is obese appearing, alert and oriented x 3 today.   HEENT: normocephalic, atraumatic; sclera clear, conjunctiva pink; hearing intact; oropharynx clear; neck supple Lungs- Clear to ausculation bilaterally, normal work of breathing.  No wheezes, rales, rhonchi Heart- Regular rate and rhythm, no murmurs, rubs or gallops GI- soft, non-tender, non-distended, bowel sounds present Extremities- no clubbing, cyanosis, or edema MS- no significant deformity or atrophy Skin- warm and dry, no rash or lesion; PPM pocket well healed Psych- euthymic mood, full affect Neuro- strength and sensation are intact  PPM Interrogation- reviewed in detail today,  See PACEART report  EKG:  EKG is ordered today. The ekg ordered today shows AV pacing at 61bpm  Recent  Labs: 11/22/2015: B Natriuretic Peptide 75.7 10/06/2016: ALT 15; BUN 40; Creatinine, Ser 1.26; Hemoglobin 11.1; Platelets 241; Potassium 3.9; Sodium 140   Wt Readings from Last 3 Encounters:  11/09/16 229 lb 12.8 oz (104.2 kg)  03/10/16 235 lb (106.6 kg)  11/23/15 224 lb 4.8 oz (101.7 kg)     Other studies Reviewed: Additional studies/ records that were reviewed today include: Dr Rayann Heman and Dr Antionette Char office notes  Assessment and Plan:  1.  Sick sinus syndrome Normal PPM function See Claudia Desanctis Art report No changes today 1 VHR episode, NSVT, asymptomatic. EF normal by echo 07/2015. Will follow.   2.  HTN Stable No change required today  3.  CAD s/p CABG No recent ischemic symptoms Continue medical therapy  4.  Chronic diastolic heart failure Stable No change required today   Current medicines are reviewed at length with the patient today.   The patient does not have concerns regarding her medicines.  The following changes were made today:  none  Labs/ tests ordered today include: none Orders Placed This Encounter  Procedures  . CUP PACEART INCLINIC DEVICE CHECK     Disposition:   Follow up with [facility], Dr Burt Knack as scheduled, me in 1 year     Signed, Chanetta Marshall, NP 11/09/2016 9:12 AM  [address] [place] [address] [phone] (office) [phone] (fax

## 2016-11-09 ENCOUNTER — Encounter: Payer: Self-pay | Admitting: Nurse Practitioner

## 2016-11-09 ENCOUNTER — Ambulatory Visit (INDEPENDENT_AMBULATORY_CARE_PROVIDER_SITE_OTHER): Payer: Medicare Other | Admitting: Nurse Practitioner

## 2016-11-09 VITALS — BP 122/64 | HR 61 | Ht 67.0 in | Wt 229.8 lb

## 2016-11-09 DIAGNOSIS — I5032 Chronic diastolic (congestive) heart failure: Secondary | ICD-10-CM

## 2016-11-09 DIAGNOSIS — I495 Sick sinus syndrome: Secondary | ICD-10-CM | POA: Diagnosis not present

## 2016-11-09 DIAGNOSIS — I2510 Atherosclerotic heart disease of native coronary artery without angina pectoris: Secondary | ICD-10-CM | POA: Diagnosis not present

## 2016-11-09 DIAGNOSIS — I10 Essential (primary) hypertension: Secondary | ICD-10-CM

## 2016-11-09 LAB — CUP PACEART INCLINIC DEVICE CHECK
Date Time Interrogation Session: 20180801085317
Implantable Lead Implant Date: 20131126
Implantable Lead Location: 753860
Implantable Lead Model: 5076
Implantable Lead Model: 5092
MDC IDC LEAD IMPLANT DT: 20131126
MDC IDC LEAD LOCATION: 753859
MDC IDC PG IMPLANT DT: 20131126

## 2016-11-09 NOTE — Patient Instructions (Signed)
Medication Instructions:  Your physician recommends that you continue on your current medications as directed. Please refer to the Current Medication list given to you today.   Labwork: None Ordered   Testing/Procedures: None Ordered   Follow-Up: Your physician wants you to follow-up in: 1 year with Gypsy BalsamAmber Seiler, NP.  You will receive a reminder letter in the mail two months in advance. If you don't receive a letter, please call our office to schedule the follow-up appointment.   Remote monitoring is used to monitor your Pacemaker of ICD from home. This monitoring reduces the number of office visits required to check your device to one time per year. It allows us to keep an eye on the functioning of your device to ensure it is working properly. You are scheduled for a device check from home on 02/08/17. You may send your transmission at any time that day. If you have a wireless device, the transmission will be sent automatically. After your physician reviews your transmission, you will receive a postcard with your next transmission date.    Any Other Special Instructions Will Be Listed Below (If Applicable).  We are giving you samples of Entresto 24/26.    If you need a refill on your cardiac medications before your next appointment, please call your pharmacy.

## 2016-11-11 DIAGNOSIS — M1000 Idiopathic gout, unspecified site: Secondary | ICD-10-CM | POA: Diagnosis not present

## 2016-11-11 DIAGNOSIS — I10 Essential (primary) hypertension: Secondary | ICD-10-CM | POA: Diagnosis not present

## 2016-11-11 DIAGNOSIS — I1310 Hypertensive heart and chronic kidney disease without heart failure, with stage 1 through stage 4 chronic kidney disease, or unspecified chronic kidney disease: Secondary | ICD-10-CM | POA: Diagnosis not present

## 2016-11-11 DIAGNOSIS — E119 Type 2 diabetes mellitus without complications: Secondary | ICD-10-CM | POA: Diagnosis not present

## 2016-11-11 DIAGNOSIS — I130 Hypertensive heart and chronic kidney disease with heart failure and stage 1 through stage 4 chronic kidney disease, or unspecified chronic kidney disease: Secondary | ICD-10-CM | POA: Diagnosis not present

## 2016-11-29 ENCOUNTER — Other Ambulatory Visit: Payer: Self-pay | Admitting: Cardiovascular Disease

## 2016-11-29 DIAGNOSIS — I130 Hypertensive heart and chronic kidney disease with heart failure and stage 1 through stage 4 chronic kidney disease, or unspecified chronic kidney disease: Secondary | ICD-10-CM | POA: Diagnosis not present

## 2016-11-29 DIAGNOSIS — I5032 Chronic diastolic (congestive) heart failure: Secondary | ICD-10-CM

## 2016-11-29 DIAGNOSIS — I1310 Hypertensive heart and chronic kidney disease without heart failure, with stage 1 through stage 4 chronic kidney disease, or unspecified chronic kidney disease: Secondary | ICD-10-CM | POA: Diagnosis not present

## 2016-11-29 DIAGNOSIS — N189 Chronic kidney disease, unspecified: Secondary | ICD-10-CM | POA: Diagnosis not present

## 2016-12-03 ENCOUNTER — Encounter (HOSPITAL_COMMUNITY): Payer: Self-pay | Admitting: Emergency Medicine

## 2016-12-03 ENCOUNTER — Observation Stay (HOSPITAL_COMMUNITY)
Admission: EM | Admit: 2016-12-03 | Discharge: 2016-12-05 | Disposition: A | Discharge status: Home / Self Care | Payer: Medicare Other | Attending: Internal Medicine | Admitting: Internal Medicine

## 2016-12-03 DIAGNOSIS — R531 Weakness: Secondary | ICD-10-CM | POA: Diagnosis not present

## 2016-12-03 DIAGNOSIS — E876 Hypokalemia: Secondary | ICD-10-CM | POA: Insufficient documentation

## 2016-12-03 DIAGNOSIS — Z8679 Personal history of other diseases of the circulatory system: Secondary | ICD-10-CM | POA: Diagnosis not present

## 2016-12-03 DIAGNOSIS — L97529 Non-pressure chronic ulcer of other part of left foot with unspecified severity: Secondary | ICD-10-CM | POA: Diagnosis not present

## 2016-12-03 DIAGNOSIS — E785 Hyperlipidemia, unspecified: Secondary | ICD-10-CM | POA: Diagnosis not present

## 2016-12-03 DIAGNOSIS — I130 Hypertensive heart and chronic kidney disease with heart failure and stage 1 through stage 4 chronic kidney disease, or unspecified chronic kidney disease: Secondary | ICD-10-CM | POA: Diagnosis not present

## 2016-12-03 DIAGNOSIS — E108 Type 1 diabetes mellitus with unspecified complications: Secondary | ICD-10-CM | POA: Diagnosis not present

## 2016-12-03 DIAGNOSIS — Z794 Long term (current) use of insulin: Secondary | ICD-10-CM | POA: Diagnosis not present

## 2016-12-03 DIAGNOSIS — IMO0001 Reserved for inherently not codable concepts without codable children: Secondary | ICD-10-CM | POA: Diagnosis present

## 2016-12-03 DIAGNOSIS — I10 Essential (primary) hypertension: Secondary | ICD-10-CM | POA: Diagnosis present

## 2016-12-03 DIAGNOSIS — N183 Chronic kidney disease, stage 3 unspecified: Secondary | ICD-10-CM | POA: Diagnosis present

## 2016-12-03 DIAGNOSIS — Z880 Allergy status to penicillin: Secondary | ICD-10-CM | POA: Insufficient documentation

## 2016-12-03 DIAGNOSIS — I700 Atherosclerosis of aorta: Secondary | ICD-10-CM | POA: Insufficient documentation

## 2016-12-03 DIAGNOSIS — E1151 Type 2 diabetes mellitus with diabetic peripheral angiopathy without gangrene: Secondary | ICD-10-CM | POA: Insufficient documentation

## 2016-12-03 DIAGNOSIS — Z79899 Other long term (current) drug therapy: Secondary | ICD-10-CM | POA: Insufficient documentation

## 2016-12-03 DIAGNOSIS — E039 Hypothyroidism, unspecified: Secondary | ICD-10-CM | POA: Insufficient documentation

## 2016-12-03 DIAGNOSIS — D649 Anemia, unspecified: Secondary | ICD-10-CM | POA: Insufficient documentation

## 2016-12-03 DIAGNOSIS — M109 Gout, unspecified: Secondary | ICD-10-CM | POA: Diagnosis not present

## 2016-12-03 DIAGNOSIS — Z882 Allergy status to sulfonamides status: Secondary | ICD-10-CM | POA: Insufficient documentation

## 2016-12-03 DIAGNOSIS — I2510 Atherosclerotic heart disease of native coronary artery without angina pectoris: Secondary | ICD-10-CM | POA: Insufficient documentation

## 2016-12-03 DIAGNOSIS — Z7902 Long term (current) use of antithrombotics/antiplatelets: Secondary | ICD-10-CM | POA: Diagnosis not present

## 2016-12-03 DIAGNOSIS — E669 Obesity, unspecified: Secondary | ICD-10-CM | POA: Diagnosis not present

## 2016-12-03 DIAGNOSIS — E118 Type 2 diabetes mellitus with unspecified complications: Secondary | ICD-10-CM

## 2016-12-03 DIAGNOSIS — E11621 Type 2 diabetes mellitus with foot ulcer: Secondary | ICD-10-CM | POA: Insufficient documentation

## 2016-12-03 DIAGNOSIS — Z6835 Body mass index (BMI) 35.0-35.9, adult: Secondary | ICD-10-CM | POA: Insufficient documentation

## 2016-12-03 DIAGNOSIS — Z7982 Long term (current) use of aspirin: Secondary | ICD-10-CM | POA: Insufficient documentation

## 2016-12-03 DIAGNOSIS — I5032 Chronic diastolic (congestive) heart failure: Secondary | ICD-10-CM | POA: Diagnosis not present

## 2016-12-03 DIAGNOSIS — E1122 Type 2 diabetes mellitus with diabetic chronic kidney disease: Secondary | ICD-10-CM

## 2016-12-03 DIAGNOSIS — I5030 Unspecified diastolic (congestive) heart failure: Secondary | ICD-10-CM | POA: Diagnosis present

## 2016-12-03 DIAGNOSIS — R42 Dizziness and giddiness: Secondary | ICD-10-CM | POA: Diagnosis not present

## 2016-12-03 DIAGNOSIS — Z951 Presence of aortocoronary bypass graft: Secondary | ICD-10-CM | POA: Insufficient documentation

## 2016-12-03 DIAGNOSIS — E1143 Type 2 diabetes mellitus with diabetic autonomic (poly)neuropathy: Secondary | ICD-10-CM | POA: Diagnosis not present

## 2016-12-03 DIAGNOSIS — N179 Acute kidney failure, unspecified: Secondary | ICD-10-CM | POA: Diagnosis not present

## 2016-12-03 DIAGNOSIS — Z950 Presence of cardiac pacemaker: Secondary | ICD-10-CM | POA: Diagnosis not present

## 2016-12-03 DIAGNOSIS — E860 Dehydration: Secondary | ICD-10-CM | POA: Diagnosis present

## 2016-12-03 DIAGNOSIS — N182 Chronic kidney disease, stage 2 (mild): Secondary | ICD-10-CM | POA: Diagnosis present

## 2016-12-03 DIAGNOSIS — R7309 Other abnormal glucose: Secondary | ICD-10-CM | POA: Diagnosis not present

## 2016-12-03 LAB — CBC WITH DIFFERENTIAL/PLATELET
Basophils Absolute: 0 10*3/uL (ref 0.0–0.1)
Basophils Relative: 0 %
Eosinophils Absolute: 0.2 10*3/uL (ref 0.0–0.7)
Eosinophils Relative: 2 %
HCT: 34.4 % — ABNORMAL LOW (ref 36.0–46.0)
Hemoglobin: 10.9 g/dL — ABNORMAL LOW (ref 12.0–15.0)
Lymphocytes Relative: 21 %
Lymphs Abs: 1.4 10*3/uL (ref 0.7–4.0)
MCH: 30 pg (ref 26.0–34.0)
MCHC: 31.7 g/dL (ref 30.0–36.0)
MCV: 94.8 fL (ref 78.0–100.0)
Monocytes Absolute: 0.6 10*3/uL (ref 0.1–1.0)
Monocytes Relative: 9 %
Neutro Abs: 4.5 10*3/uL (ref 1.7–7.7)
Neutrophils Relative %: 68 %
Platelets: 164 10*3/uL (ref 150–400)
RBC: 3.63 MIL/uL — ABNORMAL LOW (ref 3.87–5.11)
RDW: 13.9 % (ref 11.5–15.5)
WBC: 6.6 10*3/uL (ref 4.0–10.5)

## 2016-12-03 LAB — COMPREHENSIVE METABOLIC PANEL
ALT: 14 U/L (ref 14–54)
AST: 23 U/L (ref 15–41)
Albumin: 3.3 g/dL — ABNORMAL LOW (ref 3.5–5.0)
Alkaline Phosphatase: 58 U/L (ref 38–126)
Anion gap: 10 (ref 5–15)
BUN: 72 mg/dL — ABNORMAL HIGH (ref 6–20)
CO2: 26 mmol/L (ref 22–32)
Calcium: 9.8 mg/dL (ref 8.9–10.3)
Chloride: 102 mmol/L (ref 101–111)
Creatinine, Ser: 2.15 mg/dL — ABNORMAL HIGH (ref 0.44–1.00)
GFR calc Af Amer: 23 mL/min — ABNORMAL LOW (ref >60)
GFR calc non Af Amer: 20 mL/min — ABNORMAL LOW (ref >60)
Glucose, Bld: 196 mg/dL — ABNORMAL HIGH (ref 65–99)
Potassium: 3.8 mmol/L (ref 3.5–5.1)
Sodium: 138 mmol/L (ref 135–145)
Total Bilirubin: 0.5 mg/dL (ref 0.3–1.2)
Total Protein: 5.7 g/dL — ABNORMAL LOW (ref 6.5–8.1)

## 2016-12-03 LAB — TROPONIN I: Troponin I: <0.03 ng/mL (ref <0.03)

## 2016-12-03 MED ORDER — ONDANSETRON HCL 4 MG/2ML IJ SOLN
4.0000 mg | Freq: Once | INTRAMUSCULAR | Status: AC
  Administered 2016-12-03: 4 mg via INTRAVENOUS
  Filled 2016-12-03: qty 2

## 2016-12-03 NOTE — ED Triage Notes (Signed)
Brought by ems from home.  Reports feeling okay when she went to bed at 0200. Woke up around 0900 feeling weak all over.  Also c/o headache on and off and nausea.  EMS reports demand pacemaker noted to go in and out of paced rhythm between a rate of 60-90.  [id number].  Patient took insulin and ate dinner prior to coming to the hospital.

## 2016-12-04 ENCOUNTER — Observation Stay (HOSPITAL_COMMUNITY): Payer: Medicare Other

## 2016-12-04 ENCOUNTER — Emergency Department (HOSPITAL_COMMUNITY): Payer: Medicare Other

## 2016-12-04 DIAGNOSIS — N182 Chronic kidney disease, stage 2 (mild): Secondary | ICD-10-CM | POA: Diagnosis not present

## 2016-12-04 DIAGNOSIS — L97529 Non-pressure chronic ulcer of other part of left foot with unspecified severity: Secondary | ICD-10-CM | POA: Diagnosis not present

## 2016-12-04 DIAGNOSIS — I10 Essential (primary) hypertension: Secondary | ICD-10-CM

## 2016-12-04 DIAGNOSIS — N179 Acute kidney failure, unspecified: Secondary | ICD-10-CM

## 2016-12-04 DIAGNOSIS — E1122 Type 2 diabetes mellitus with diabetic chronic kidney disease: Secondary | ICD-10-CM | POA: Diagnosis not present

## 2016-12-04 DIAGNOSIS — N183 Chronic kidney disease, stage 3 unspecified: Secondary | ICD-10-CM | POA: Diagnosis present

## 2016-12-04 DIAGNOSIS — E11621 Type 2 diabetes mellitus with foot ulcer: Secondary | ICD-10-CM | POA: Diagnosis not present

## 2016-12-04 DIAGNOSIS — Z794 Long term (current) use of insulin: Secondary | ICD-10-CM

## 2016-12-04 DIAGNOSIS — R531 Weakness: Secondary | ICD-10-CM | POA: Diagnosis not present

## 2016-12-04 DIAGNOSIS — E860 Dehydration: Secondary | ICD-10-CM

## 2016-12-04 DIAGNOSIS — I5032 Chronic diastolic (congestive) heart failure: Secondary | ICD-10-CM | POA: Diagnosis not present

## 2016-12-04 LAB — URINALYSIS, ROUTINE W REFLEX MICROSCOPIC
BILIRUBIN URINE: NEGATIVE
Glucose, UA: NEGATIVE mg/dL
Hgb urine dipstick: NEGATIVE
KETONES UR: NEGATIVE mg/dL
Nitrite: NEGATIVE
PH: 5 (ref 5.0–8.0)
Protein, ur: NEGATIVE mg/dL
Specific Gravity, Urine: 1.014 (ref 1.005–1.030)

## 2016-12-04 LAB — CBC
HCT: 33 % — ABNORMAL LOW (ref 36.0–46.0)
HEMOGLOBIN: 10.5 g/dL — AB (ref 12.0–15.0)
MCH: 29.7 pg (ref 26.0–34.0)
MCHC: 31.8 g/dL (ref 30.0–36.0)
MCV: 93.2 fL (ref 78.0–100.0)
PLATELETS: 167 10*3/uL (ref 150–400)
RBC: 3.54 MIL/uL — AB (ref 3.87–5.11)
RDW: 13.7 % (ref 11.5–15.5)
WBC: 7.5 10*3/uL (ref 4.0–10.5)

## 2016-12-04 LAB — TROPONIN I

## 2016-12-04 LAB — GLUCOSE, CAPILLARY
GLUCOSE-CAPILLARY: 78 mg/dL (ref 65–99)
GLUCOSE-CAPILLARY: 167 mg/dL — AB (ref 65–99)
Glucose-Capillary: 128 mg/dL — ABNORMAL HIGH (ref 65–99)
Glucose-Capillary: 149 mg/dL — ABNORMAL HIGH (ref 65–99)

## 2016-12-04 LAB — BASIC METABOLIC PANEL
ANION GAP: 10 (ref 5–15)
BUN: 69 mg/dL — ABNORMAL HIGH (ref 6–20)
CHLORIDE: 104 mmol/L (ref 101–111)
CO2: 25 mmol/L (ref 22–32)
Calcium: 9.3 mg/dL (ref 8.9–10.3)
Creatinine, Ser: 1.85 mg/dL — ABNORMAL HIGH (ref 0.44–1.00)
GFR, EST AFRICAN AMERICAN: 28 mL/min — AB (ref >60)
GFR, EST NON AFRICAN AMERICAN: 24 mL/min — AB (ref >60)
Glucose, Bld: 126 mg/dL — ABNORMAL HIGH (ref 65–99)
POTASSIUM: 3.2 mmol/L — AB (ref 3.5–5.1)
SODIUM: 139 mmol/L (ref 135–145)

## 2016-12-04 MED ORDER — AMLODIPINE BESYLATE 5 MG PO TABS
5.0000 mg | ORAL_TABLET | Freq: Every evening | ORAL | Status: DC
  Filled 2016-12-04: qty 1

## 2016-12-04 MED ORDER — KETOTIFEN FUMARATE 0.025 % OP SOLN
1.0000 [drp] | Freq: Two times a day (BID) | OPHTHALMIC | Status: DC
  Administered 2016-12-04 – 2016-12-05 (×4): 1 [drp] via OPHTHALMIC
  Filled 2016-12-04: qty 5

## 2016-12-04 MED ORDER — INSULIN ASPART 100 UNIT/ML ~~LOC~~ SOLN
0.0000 [IU] | Freq: Three times a day (TID) | SUBCUTANEOUS | Status: DC
  Administered 2016-12-04 – 2016-12-05 (×2): 2 [IU] via SUBCUTANEOUS

## 2016-12-04 MED ORDER — CLOPIDOGREL BISULFATE 75 MG PO TABS
75.0000 mg | ORAL_TABLET | Freq: Every day | ORAL | Status: DC
  Administered 2016-12-04 – 2016-12-05 (×2): 75 mg via ORAL
  Filled 2016-12-04 (×2): qty 1

## 2016-12-04 MED ORDER — SODIUM CHLORIDE 0.9 % IV SOLN
INTRAVENOUS | Status: DC
  Administered 2016-12-04 (×2): via INTRAVENOUS

## 2016-12-04 MED ORDER — CARVEDILOL 3.125 MG PO TABS
3.1250 mg | ORAL_TABLET | Freq: Two times a day (BID) | ORAL | Status: DC
  Administered 2016-12-04 – 2016-12-05 (×3): 3.125 mg via ORAL
  Filled 2016-12-04 (×3): qty 1

## 2016-12-04 MED ORDER — COLCHICINE 0.6 MG PO TABS: 0.6000 mg | ORAL_TABLET | Freq: Every day | ORAL | Status: DC | PRN

## 2016-12-04 MED ORDER — POTASSIUM CHLORIDE CRYS ER 20 MEQ PO TBCR
40.0000 meq | EXTENDED_RELEASE_TABLET | Freq: Once | ORAL | Status: AC
  Administered 2016-12-04: 40 meq via ORAL
  Filled 2016-12-04: qty 2

## 2016-12-04 MED ORDER — HYDRALAZINE HCL 25 MG PO TABS
25.0000 mg | ORAL_TABLET | Freq: Three times a day (TID) | ORAL | Status: DC
  Administered 2016-12-04: 25 mg via ORAL
  Filled 2016-12-04 (×2): qty 1

## 2016-12-04 MED ORDER — AMLODIPINE BESYLATE 5 MG PO TABS
5.0000 mg | ORAL_TABLET | Freq: Every morning | ORAL | Status: DC
  Administered 2016-12-05: 5 mg via ORAL
  Filled 2016-12-04: qty 1

## 2016-12-04 MED ORDER — ENOXAPARIN SODIUM 40 MG/0.4ML ~~LOC~~ SOLN
40.0000 mg | SUBCUTANEOUS | Status: DC
  Administered 2016-12-04: 40 mg via SUBCUTANEOUS
  Filled 2016-12-04: qty 0.4

## 2016-12-04 MED ORDER — SODIUM CHLORIDE 0.9 % IV BOLUS (SEPSIS)
1000.0000 mL | Freq: Once | INTRAVENOUS | Status: AC
  Administered 2016-12-04: 1000 mL via INTRAVENOUS

## 2016-12-04 MED ORDER — ACETAMINOPHEN 650 MG RE SUPP
650.0000 mg | Freq: Four times a day (QID) | RECTAL | Status: DC | PRN
Start: 2016-12-04 — End: 2016-12-05

## 2016-12-04 MED ORDER — CALCIUM CARBONATE ANTACID 500 MG PO CHEW
2.0000 | CHEWABLE_TABLET | ORAL | Status: DC | PRN
Start: 2016-12-04 — End: 2016-12-05

## 2016-12-04 MED ORDER — LEVOTHYROXINE SODIUM 100 MCG PO TABS
100.0000 ug | ORAL_TABLET | Freq: Every day | ORAL | Status: DC
  Administered 2016-12-04 – 2016-12-05 (×2): 100 ug via ORAL
  Filled 2016-12-04 (×2): qty 1

## 2016-12-04 MED ORDER — ASPIRIN 81 MG PO CHEW
81.0000 mg | CHEWABLE_TABLET | Freq: Every day | ORAL | Status: DC
  Administered 2016-12-04 – 2016-12-05 (×2): 81 mg via ORAL
  Filled 2016-12-04 (×2): qty 1

## 2016-12-04 MED ORDER — ALLOPURINOL 100 MG PO TABS
100.0000 mg | ORAL_TABLET | Freq: Every day | ORAL | Status: DC
  Administered 2016-12-04 – 2016-12-05 (×2): 100 mg via ORAL
  Filled 2016-12-04 (×2): qty 1

## 2016-12-04 MED ORDER — INSULIN GLARGINE 100 UNIT/ML ~~LOC~~ SOLN
20.0000 [IU] | Freq: Every day | SUBCUTANEOUS | Status: DC
  Administered 2016-12-04 – 2016-12-05 (×2): 20 [IU] via SUBCUTANEOUS
  Filled 2016-12-04 (×3): qty 0.2

## 2016-12-04 MED ORDER — ATORVASTATIN CALCIUM 20 MG PO TABS
20.0000 mg | ORAL_TABLET | Freq: Every day | ORAL | Status: DC
  Administered 2016-12-04 – 2016-12-05 (×2): 20 mg via ORAL
  Filled 2016-12-04 (×2): qty 1

## 2016-12-04 MED ORDER — ONDANSETRON HCL 4 MG/2ML IJ SOLN: 4.0000 mg | Freq: Four times a day (QID) | INTRAMUSCULAR | Status: DC | PRN

## 2016-12-04 MED ORDER — ONDANSETRON HCL 4 MG PO TABS: 4.0000 mg | ORAL_TABLET | Freq: Four times a day (QID) | ORAL | Status: DC | PRN

## 2016-12-04 MED ORDER — ACETAMINOPHEN 325 MG PO TABS: 650.0000 mg | ORAL_TABLET | Freq: Four times a day (QID) | ORAL | Status: DC | PRN

## 2016-12-04 NOTE — Progress Notes (Signed)
Progress Note    [name]  [name]:[id number] DOB: [birth year]  DOA: 12/03/2016 PCP: Renaye Rakers, MD    Brief Narrative:   Chief complaint: Follow-up weakness/AKI.  Medical records reviewed and are as summarized below:  [name] is an 83 y.o. female with a PMH of CAD, SSS with dual-chamber pacer, chronic diastolic CHF, hypertension and type 2 diabetes who was admitted 12/03/16 with a chief complaint of feeling weak all over associated with intermittent headache and nausea. In the ED, creatinine was 2.1 and she was noted to have a diabetic foot ulcer on the second toe of her left foot.  Assessment/Plan:   Principal Problems:   Generalized weakness with AKI (acute kidney injury) (HCC) on stage III CKD Given 1 L bolus in the ED and then IV fluids initiated 75 mL per hour. Diuretics and ACE inhibitor held. Creatinine improved to 2.15---> 1.85. Baseline creatinine 1.1-1.2. Baseline GFR 45-51 consistent with stage III CKD. We'll obtain PT evaluation.    Diabetic toe ulcer Plain films reviewed. Need better visualization of the area to rule out osteomyelitis. CT subsequently done which showed arthropathic changes throughout the midfoot and forefoot consistent with Charcot arthropathy and gout with nonspecific fragmentation of the second middle phalanx. There was no surrounding soft tissue findings suggestive of infection.  Active Problems:   Essential hypertension Currently being managed with Norvasc, hydralazine and Coreg. Chlorthalidone, Entresto and Lasix on hold. Blood pressure controlled.    Chronic diastolic heart failure (HCC) 2-D echo 07/13/15 showed an EF of 55-60 percent.    DM (diabetes mellitus), type 2 with renal complications (HCC) Home regimen consists of 55 units of NPH twice a day and Actos. Currently being managed with Lantus 20 units daily and insulin sensitive SSI 3 times a day.    Normocytic anemia Likely from anemia of chronic kidney disease.     Obesity Body mass index is 35.15 kg/m.     Hypothyroidism Continue Synthroid.    Hyperlipidemia Continue statin.    History of sick sinus syndrome status post pacemaker    Gout Continue allopurinol and colchicine.    Hypokalemia We'll give 40 mEq of potassium today.    CAD status post CABG in 2011, PCI with DES 2009 Continue aspirin, Plavix, and statin.  Family Communication/Anticipated D/C date and plan/Code Status   DVT prophylaxis: Lovenox ordered. Code Status:DO NOT RESUSCITATE.  Family Communication: No family at the bedside. Patient was instructed to have the nurse page me if her family would like to speak with me. Disposition Plan: Home in hours of renal function back to baseline.   Medical Consultants:    None.   Anti-Infectives:    None  Subjective:   Feels weak.  Had diarrhea yesterday.  No nausea or vomiting.  Poor appetite. Has some pain in the left 2nd toe.  Objective:    Vitals:   12/04/16 0145 12/04/16 0200 12/04/16 0215 12/04/16 0246  BP: (!) 127/43 (!) 108/46 (!) 124/48 (!) 147/52  Pulse: (!) 50 63 (!) 57 100  Resp: 17 15 15 16   Temp:    97.9 F (36.6 C)  TempSrc:    Oral  SpO2: 100% 100% 99% 100%  Weight:    101.8 kg (224 lb 6.4 oz)  Height:        Intake/Output Summary (Last 24 hours) at 12/04/16 0721 Last data filed at 12/04/16 5621  Gross per 24 hour  Intake  162.5 ml  Output                0 ml  Net            162.5 ml   Filed Weights   12/03/16 2234 12/04/16 0246  Weight: 99.3 kg (219 lb) 101.8 kg (224 lb 6.4 oz)    Exam: General: No acute distress. Elderly female, with alopecia. Cardiovascular: Heart sounds show a regular rate, and rhythm. No gallops or rubs. No murmurs. No JVD. Lungs: Clear to auscultation bilaterally with good air movement. No rales, rhonchi or wheezes. Abdomen: Soft, nontender, nondistended with normal active bowel sounds. No masses. No hepatosplenomegaly. Neurological: Alert and  oriented 3. Moves all extremities 4, generalized weakness. Cranial nerves II through XII grossly intact. Skin: Warm and dry. No rashes or lesions. Toe as pictured below. Extremities: No clubbing or cyanosis. Trace edema. Pedal pulses 2+. Left 2nd toe as pictured.Onychomycosis present. Psychiatric: Mood and affect are normal. Insight and judgment are normal.     Data Reviewed:   I have personally reviewed following labs and imaging studies:  Labs: Labs show the following: Sodium 139, potassium 3.2, chloride 104, bicarbonate 25, BUN 69, creatinine 1.85, glucose 126. WBC 7.5, hemoglobin 10.5, hematocrit 33, platelets 167.  Troponins negative 2.  LFTs WNL. Protein 5.7, albumin 3.3.  Microbiology No results found for this or any previous visit (from the past 240 hour(s)).  Procedures and diagnostic studies:  12/04/16 Dg Chest 2 View: Stable appearance of the chest with aortic atherosclerosis and borderline cardiomegaly. No acute pneumonic consolidation or CHF.   12/04/16 Dg Foot 2 Views Left:  Poor visualization of the left second total due to overlap on the lateral projection. The second middle phalanx appearance abnormal and may be partially eroded. CT may provide better assessment of this bone. Alternatively, oblique radiographs could be considered.   12/05/16: Ct Foot Left Wo Contrast: 1. Arthropathic changes throughout the midfoot and forefoot, suspected to reflect a combination of Charcot arthropathy and possible gout. 2. Nonspecific fragmentation of the second middle phalanx. This could be the sequela of arthropathy, prior trauma or prior infection. The lack of impressive surrounding soft tissue findings argues against acute infection. 3. Dorsal forefoot soft tissue swelling. No focal abscess identified.   Medications:   . allopurinol  100 mg Oral Daily  . amLODipine  5 mg Oral QPM  . aspirin  81 mg Oral Daily  . atorvastatin  20 mg Oral Daily  . carvedilol  3.125 mg Oral BID WC   . clopidogrel  75 mg Oral Daily  . enoxaparin (LOVENOX) injection  40 mg Subcutaneous Q24H  . hydrALAZINE  25 mg Oral TID  . insulin aspart  0-9 Units Subcutaneous TID WC  . insulin glargine  20 Units Subcutaneous Daily  . ketotifen  1 drop Both Eyes BID  . levothyroxine  100 mcg Oral QAC breakfast   Continuous Infusions: . sodium chloride 75 mL/hr at 12/04/16 0350     LOS: 0 days   Maebel Marasco  [facility] Pager [phone]. If unable to reach me by pager, please call my cell phone at [phone].  *Please refer to [email or web address], password [email or web address] to get updated schedule on who will round on this patient, as hospitalists switch teams weekly. If 7PM-7AM, please contact night-coverage at [email or web address], password [email or web address] for any overnight needs.  12/04/2016, 7:21 AM

## 2016-12-04 NOTE — ED Notes (Signed)
Taken to xray.

## 2016-12-04 NOTE — Progress Notes (Signed)
New Admission Note:  Arrival Method: By bed from ED around 0300 Mental Orientation: Alert and oriented Telemetry: Box 11, CCMD notified Assessment: Completed Skin: Completed, refer to flowsheets IV: Left forearm Pain: Denies Tubes: None Safety Measures: Safety Fall Prevention Plan was given, discussed  Admission: Completed [address] Orientation: Patient has been orientated to the room, unit and the staff. Family: None  Orders have been reviewed and implemented. Will continue to monitor the patient. Call light has been placed within reach and bed alarm has been activated.   Alfonse Ras, RN  Phone Number: [phone]

## 2016-12-04 NOTE — Progress Notes (Signed)
Patient became dizzy when up to use the [facility]. B/P 129/35 HR 60. Patient drank juice and was told to use bed pan for now. Will monitor. MD informed.  Avelina Laine RN

## 2016-12-04 NOTE — Evaluation (Signed)
Physical Therapy Evaluation Patient Details Name: [name] MRN: [id number] DOB: June 29, 1932 Today's Date: 12/04/2016   History of Present Illness  Pt is an 83 y.o. female with a PMH of CAD, SSS with dual-chamber pacer, chronic diastolic CHF, hypertension and type 2 diabetes who was admitted 12/03/16 with a chief complaint of feeling weak all over associated with intermittent headache and nausea. In the ED, creatinine was 2.1 and she was noted to have a diabetic foot ulcer on the second toe of her left foot.  Clinical Impression  Pt admitted with above diagnosis. Pt currently with functional limitations due to the deficits listed below (see PT Problem List). On eval, pt required min/mod assist bed mobility and min assist transfers. Pt unable to progress with ambulation due to dizziness. Pt required return to supine in bed after using the [facility]. While on BSC, pt with c/o dizziness. Upon return to supine in bed, she appeared lethargic. BP 106/32 and HR 58. RN notified. Repeat BP taken after 5 minutes and found to be 129/35. Pt will benefit from skilled PT to increase their independence and safety with mobility to allow discharge to the venue listed below.  Pending pt progress and LOS, SNF may need to be considered.     Follow Up Recommendations Home health PT;Supervision/Assistance - 24 hour    Equipment Recommendations       Recommendations for Other Services       Precautions / Restrictions Precautions Precautions: Fall      Mobility  Bed Mobility Overal bed mobility: Needs Assistance Bed Mobility: Supine to Sit;Sit to Supine     Supine to sit: Min assist Sit to supine: Mod assist   General bed mobility comments: verbal cues for sequencing, assist to elevate trunk out of bed, assist with BLE into bed  Transfers Overall transfer level: Needs assistance Equipment used: Rolling walker (2 wheeled) Transfers: Sit to/from [employer] Sit to Stand: Min assist Stand  pivot transfers: Min assist       General transfer comment: Pt performed SPT bed <>BSC without AD. Pt performed sit <>stand with RW for toilet hygeine from [facility]. Pt with c/o dizziness and not feeling well requiring return to supine in bed.   Ambulation/Gait             General Gait Details: unable due to dizziness  Stairs            Wheelchair Mobility    Modified Rankin (Stroke Patients Only)       Balance                                             Pertinent Vitals/Pain Pain Assessment: No/denies pain    Home Living Family/patient expects to be discharged to:: Private residence Living Arrangements: Children Available Help at Discharge: Family;Available 24 hours/day Type of Home: House Home Access: Stairs to enter   [employer] of Steps: 8 Home Layout: Able to live on main level with bedroom/bathroom Home Equipment: Walker - 4 wheels;Bedside commode;Cane - single point      Prior Function Level of Independence: Needs assistance   Gait / Transfers Assistance Needed: Mod I ambulation household distances with rollator. Family assists with stairs to enter/exit house.  ADL's / Homemaking Assistance Needed: Family provides set-up for showering. Pt does all the cooking. Family assists with household chores.  Hand Dominance        Extremity/Trunk Assessment   Upper Extremity Assessment Upper Extremity Assessment: Generalized weakness    Lower Extremity Assessment Lower Extremity Assessment: Generalized weakness    Cervical / Trunk Assessment Cervical / Trunk Assessment: Kyphotic  Communication   Communication: No difficulties  Cognition Arousal/Alertness: Awake/alert Behavior During Therapy: WFL for tasks assessed/performed Overall Cognitive Status: Within Functional Limits for tasks assessed                                        General Comments      Exercises     Assessment/Plan     PT Assessment Patient needs continued PT services  PT Problem List Decreased strength;Decreased mobility;Decreased activity tolerance;Cardiopulmonary status limiting activity;Decreased balance       PT Treatment Interventions DME instruction;Gait training;Stair training;Functional mobility training;Balance training;Therapeutic exercise;Therapeutic activities;Patient/family education    PT Goals (Current goals can be found in the Care Plan section)  Acute Rehab PT Goals Patient Stated Goal: home PT Goal Formulation: With patient Time For Goal Achievement: 12/18/16 Potential to Achieve Goals: Good    Frequency Min 3X/week   Barriers to discharge        Co-evaluation               AM-PAC PT "6 Clicks" Daily Activity  Outcome Measure Difficulty turning over in bed (including adjusting bedclothes, sheets and blankets)?: Unable Difficulty moving from lying on back to sitting on the side of the bed? : Unable Difficulty sitting down on and standing up from a chair with arms (e.g., wheelchair, bedside commode, etc,.)?: Unable Help needed moving to and from a bed to chair (including a wheelchair)?: A Little Help needed walking in hospital room?: A Lot Help needed climbing 3-5 steps with a railing? : Total 6 Click Score: 9    End of Session Equipment Utilized During Treatment: Gait belt Activity Tolerance: Treatment limited secondary to medical complications (Comment) (dizziness, hypotension) Patient left: in bed;with bed alarm set;with call bell/phone within reach;with family/visitor present;with nursing/sitter in room Nurse Communication: Mobility status;Other (comment) (dizziness, low BP) PT Visit Diagnosis: Muscle weakness (generalized) (M62.81);Difficulty in walking, not elsewhere classified (R26.2)    Time: 9166-0600 PT Time Calculation (min) (ACUTE ONLY): 49 min   Charges:   PT Evaluation $PT Eval Moderate Complexity: 1 Mod PT Treatments $Therapeutic Activity: 23-37  mins   PT G Codes:   PT G-Codes **NOT FOR INPATIENT CLASS** Functional Assessment Tool Used: AM-PAC 6 Clicks Basic Mobility Functional Limitation: Mobility: Walking and moving around Mobility: Walking and Moving Around Current Status (K5997): At least 60 percent but less than 80 percent impaired, limited or restricted Mobility: Walking and Moving Around Goal Status [id number]): At least 20 percent but less than 40 percent impaired, limited or restricted    Aida Raider, PT  Office # [phone] Pager [phone]   Ilda Foil 12/04/2016, 3:23 PM

## 2016-12-04 NOTE — Progress Notes (Signed)
MD stated to hold BP medications tonight.   Larey Days, RN

## 2016-12-04 NOTE — ED Notes (Signed)
Patient transported to X-ray 

## 2016-12-04 NOTE — H&P (Addendum)
History and Physical    [name] MBW:[id number] DOB: [birth year] DOA: 12/03/2016  PCP: Lucianne Lei, MD  Patient coming from: Home  I have personally briefly reviewed patient's old medical records in [place]  Chief Complaint: Generalized weakness  [name] is a 83 y.o. female with medical history significant of CAD, SSS with dual chamber pacer, chronic diastolic CHF with nl EF, HTN, DM2.  Patient presents to the ED with c/o generalized weakness.  She said she was felling okay when she went to bed at 2 pm.  Woke up at 0900 feeling weak all over.  She reports intermittent headache and nausea.  She denies CP, SOB, abd pain, focal or sided weakness, dysuria.  Does have ulcer on 2nd toe of L foot that she is supposed to see podiatrist next week about.   ED Course: CXR neg, UA neg, labs are remarkable for AKI with creat 2.1 up from a 1.1 baseline, BUN 72.  HGB 10.9 down from 11.1 in June.  EKG shows paced rhythm.  Trop neg x1.   Review of Systems: As per HPI otherwise 10 point review of systems negative.   Past Medical History:  Diagnosis Date  . Allergic rhinitis   . Anemia   . Anxiety   . Barrett esophagus   . CAD (coronary artery disease) 2009   a. Multivessel s/p PCI w/DES 2009 // b. s/p CABG 2011  //  c. LHC 8/15: pLAD 95 ISR, LCx 100, pOM1 40, dRCA 100, S-OM1/OM2 ok, S-D1 ok, S-PDA ok, L-LAD ok, EF 60%  . Carotid artery disease ([place])    a. Carotid US 0/17: RICA 7-93%; LICA 90-30% >> FU 1 year  //  b. Carotid US 9/17: R 1-39%, L 40-59% >> FU 1 year  . Chronic diastolic heart failure ([place])   . CKD (chronic kidney disease), stage II    GFR 60-89 ml/min  . Depression   . Disc disease, degenerative, cervical   . Diverticulosis   . Gastroparesis   . GERD (gastroesophageal reflux disease)   . Gout   . H/O hiatal hernia   . Helicobacter pylori gastritis   . History of echocardiogram    a. Echo 11/13: EF 55% to 60%. Grade 2 diastolic dysfunction, MAC,  trivial MR, mild LAE, normal RVSF, mild RAE, PASP 39 mmHg  //  b. Echo 4/17: EF 55-60%, normal wall motion, trivial AI, MAC, moderate LAE, mild RVE, PASP 35 mmHg  . History of thrombocytopenia   . HTN (hypertension)   . Hyperlipidemia   . Hypothyroidism   . LBP (low back pain)    Lumbar disc disease/lumbar spinal stenosis  . Morbid obesity ([place])   . Myocardial infarction ([place])   . Osteoarthritis   . Osteopenia   . PVD (peripheral vascular disease) ([place])   . Sick sinus syndrome [facility])    MDT Dual-chamber PPM implant 02/2012  . Type II or unspecified type diabetes mellitus without mention of complication, not stated as uncontrolled     Past Surgical History:  Procedure Laterality Date  . ABDOMINAL HYSTERECTOMY    . CARDIAC CATHETERIZATION     2011  DR COOPER (APPT NEXT WEEK)  . CHOLECYSTECTOMY    . CORONARY ARTERY BYPASS GRAFT  2011   LIMA-LAD, SVG-DIAG, SVG-OM1-OM2, SVG-PDA  . CORONARY STENT PLACEMENT     Drug-eluting stent to the left anterior descending, circumflex and right coronary artery in Jan 2009  . EYE SURGERY     BIL  CATARACT REMOVAL 06/2010  . LEFT HEART CATHETERIZATION WITH CORONARY ANGIOGRAM N/A 12/02/2013   Procedure: LEFT HEART CATHETERIZATION WITH CORONARY ANGIOGRAM;  Surgeon: Sinclair Grooms, MD;  Location: [facility] CATH LAB;  Service: Cardiovascular;  Laterality: N/A;  . OVARIAN CYST REMOVAL    . PACEMAKER INSERTION  03/06/12   MDT Adapta L implanted by Dr Rayann Heman for SSS  . PERMANENT PACEMAKER INSERTION N/A 03/06/2012   Procedure: PERMANENT PACEMAKER INSERTION;  Surgeon: Thompson Grayer, MD;  Location: [facility] CATH LAB;  Service: Cardiovascular;  Laterality: N/A;  . SHOULDER ARTHROSCOPY  06/16/2011   Procedure: ARTHROSCOPY SHOULDER;  Surgeon: Sharmon Revere, MD;  Location: [place];  Service: Orthopedics;  Laterality: Left;  LEFT SHOULDER ARTHROSCOPY ACROMIALPLASTY, POSSIBLE MINI OPEN CUFF REPAIR   . TUBAL LIGATION       reports that she has quit smoking. She has never used  smokeless tobacco. She reports that she does not drink alcohol or use drugs.  Allergies  Allergen Reactions  . Ciprofloxacin Nausea And Vomiting    syncope  . Codeine Other (See Comments)    HALLUCINATIONS  . Hydrocodone Other (See Comments)    Makes her pass out  . Penicillins Hives  . Shellfish Allergy Hives  . Diltiazem Hcl Other (See Comments)    : low heart rate  . Sulfonamide Derivatives Nausea And Vomiting  . Lovastatin Other (See Comments)    Pt doesn't remember a reaction  . Metformin Other (See Comments)    diarrhea    Family History  Problem Relation Age of Onset  . Diabetes Mother   . Hypertension Mother   . Heart attack Mother   . Stroke Father   . Coronary artery disease Other      Prior to Admission medications   Medication Sig Start Date End Date Taking? Authorizing Provider  allopurinol (ZYLOPRIM) 100 MG tablet Take 100 mg by mouth daily.   Yes [provider]  amLODipine (NORVASC) 5 MG tablet Take 5 mg by mouth every evening.  09/02/16  Yes [provider]  aspirin 81 MG tablet Take 81 mg by mouth daily.   Yes [provider]  atorvastatin (LIPITOR) 20 MG tablet Take 1 tablet (20 mg total) by mouth daily. 12/15/15  Yes Sherren Mocha, MD  azelastine (OPTIVAR) 0.05 % ophthalmic solution Place 1 drop into both eyes 2 (two) times daily. 10/26/15  Yes [provider]  carvedilol (COREG) 3.125 MG tablet Take 1 tablet (3.125 mg total) by mouth 2 (two) times daily. 11/02/16  Yes Weaver, Scott T, PA-C  chlorthalidone (HYGROTON) 25 MG tablet Take 25 mg by mouth daily.  07/06/15  Yes [provider]  clopidogrel (PLAVIX) 75 MG tablet TAKE 1 TABLET BY MOUTH  DAILY 11/29/16  Yes Sherren Mocha, MD  colchicine 0.6 MG tablet Take 0.6 mg by mouth daily as needed (gout).    Yes [provider]  furosemide (LASIX) 40 MG tablet Take 40 mg by mouth daily.   Yes [provider]  HUMULIN N 100 UNIT/ML injection Inject  10-45 Units into the skin 2 (two) times daily before a meal. 45 units in the morning and 10 units at bedtime 07/06/15  Yes [provider]  hydrALAZINE (APRESOLINE) 25 MG tablet Take 25 mg by mouth 3 (three) times daily.  07/06/15  Yes [provider]  levothyroxine (SYNTHROID, LEVOTHROID) 100 MCG tablet Take 1 tablet (100 mcg total) by mouth daily. 03/13/12  Yes Renato Shin, MD  NITROSTAT 0.4 MG  SL tablet DISSOLVE ONE TABLET UNDER THE TONGUE EVERY 5 MINUTES AS NEEDED FOR CHEST PAIN.  DO NOT EXCEED A TOTAL OF 3 DOSES IN 15 MINUTES 12/04/15  Yes Sherren Mocha, MD  pioglitazone (ACTOS) 15 MG tablet Take 15 mg by mouth daily.  07/06/15  Yes [provider]  sacubitril-valsartan (ENTRESTO) 24-26 MG Take 1 tablet by mouth 2 (two) times daily.   Yes [provider]  ONE TOUCH ULTRA TEST test strip 1 each by Other route daily as needed (blood sugar).  07/10/13   [provider]    Physical Exam: Vitals:   12/03/16 2345 12/04/16 0000 12/04/16 0030 12/04/16 0100  BP: (!) 120/48 (!) 178/59 (!) 102/39 (!) 111/41  Pulse: 63 69 (!) 58 (!) 55  Resp: _0 Temp:      TempSrc:      SpO2: 100% 100% 100% 97%  Weight:      Height:        Constitutional: NAD, calm, comfortable Eyes: PERRL, lids and conjunctivae normal ENMT: Mucous membranes are moist. Posterior pharynx clear of any exudate or lesions.Normal dentition.  Neck: normal, supple, no masses, no thyromegaly Respiratory: clear to auscultation bilaterally, no wheezing, no crackles. Normal respiratory effort. No accessory muscle use.  Cardiovascular: Regular rate and rhythm, no murmurs / rubs / gallops. No extremity edema. 2+ pedal pulses. No carotid bruits.  Abdomen: no tenderness, no masses palpated. No hepatosplenomegaly. Bowel sounds positive.  Musculoskeletal: no clubbing / cyanosis. No joint deformity upper and lower extremities. Good ROM, no contractures. Normal muscle tone.  Skin: Does have  ulcer on 2nd toe of L foot Neurologic: CN 2-12 grossly intact. Sensation intact, DTR normal. Strength 5/5 in all 4.  Psychiatric: Normal judgment and insight. Alert and oriented x 3. Normal mood.    Labs on Admission: I have personally reviewed following labs and imaging studies  CBC:  Recent Labs Lab 12/03/16 2310  WBC 6.6  NEUTROABS 4.5  HGB 10.9*  HCT 34.4*  MCV 94.8  PLT 950   Basic Metabolic Panel:  Recent Labs Lab 12/03/16 2310  NA 138  K 3.8  CL 102  CO2 26  GLUCOSE 196*  BUN 72*  CREATININE 2.15*  CALCIUM 9.8   GFR: Estimated Creatinine Clearance: 24.4 mL/min (A) (by C-G formula based on SCr of 2.15 mg/dL (H)). Liver Function Tests:  Recent Labs Lab 12/03/16 2310  AST 23  ALT 14  ALKPHOS 58  BILITOT 0.5  PROT 5.7*  ALBUMIN 3.3*   No results for input(s): LIPASE, AMYLASE in the last 168 hours. No results for input(s): AMMONIA in the last 168 hours. Coagulation Profile: No results for input(s): INR, PROTIME in the last 168 hours. Cardiac Enzymes:  Recent Labs Lab 12/03/16 2310  TROPONINI <0.03   BNP (last 3 results) No results for input(s): PROBNP in the last 8760 hours. HbA1C: No results for input(s): HGBA1C in the last 72 hours. CBG: No results for input(s): GLUCAP in the last 168 hours. Lipid Profile: No results for input(s): CHOL, HDL, LDLCALC, TRIG, CHOLHDL, LDLDIRECT in the last 72 hours. Thyroid Function Tests: No results for input(s): TSH, T4TOTAL, FREET4, T3FREE, THYROIDAB in the last 72 hours. Anemia Panel: No results for input(s): VITAMINB12, FOLATE, FERRITIN, TIBC, IRON, RETICCTPCT in the last 72 hours. Urine analysis:    Component Value Date/Time   COLORURINE YELLOW 12/03/2016 2309   APPEARANCEUR HAZY (A) 12/03/2016 2309   LABSPEC 1.014 12/03/2016 2309   PHURINE 5.0 12/03/2016  [place] 12/03/2016 2309   GLUCOSEU 100 (?) 09/11/2009 0000   HGBUR NEGATIVE 12/03/2016 2309   HGBUR negative 05/11/2010 [place] 12/03/2016 [place] 12/03/2016 2309   PROTEINUR NEGATIVE 12/03/2016 2309   UROBILINOGEN 0.2 08/03/2014 0937   NITRITE NEGATIVE 12/03/2016 2309   LEUKOCYTESUR SMALL (A) 12/03/2016 2309    Radiological Exams on Admission: Dg Chest 2 View  Result Date: 12/04/2016 CLINICAL DATA:  Weakness, headache and nausea EXAM: CHEST  2 VIEW COMPARISON:  10/06/2016. FINDINGS: Lung volumes are low with borderline cardiomegaly and aortic atherosclerosis. Right atrial and right ventricular pacing leads are again noted with left-sided pacemaker apparatus. No pneumonic consolidation, effusion nor overt pulmonary edema. No acute nor suspicious osseous abnormalities. IMPRESSION: Stable appearance of the chest with aortic atherosclerosis and borderline cardiomegaly. No acute pneumonic consolidation or CHF. Electronically Signed   By: Ashley Royalty M.D.   On: 12/04/2016 00:50    EKG: Independently reviewed.  Assessment/Plan Principal Problem:   AKI (acute kidney injury) ([place]) Active Problems:   Essential hypertension   Chronic diastolic heart failure (HCC)   CKD (chronic kidney disease) stage 2, GFR 60-89 ml/min   DM (diabetes mellitus), type 2 with renal complications ([place])    1. AKI on CKD stage 2 - 1. Pre-renal pattern of FTD:DUKGU ratio, possibly dehydration? 2. Intake and output 3. Hold diuretics 4. Hold ACEi 5. IVF: 1L bolus in ED and 75 cc/hr NS 6. Repeat BMP in AM 7. If no rapid improvement then will need further work up: renal US, urine lytes, and possibly nephrology consult. 2. DM2 - 1. Takes total of 55 units NPH at home, will hold this 2. So will do 20 units Lantus QDay while here in hospital given AKI 3. And sensitive scale SSI AC 4. Additionally will discontinue her Actos (Actos not recommended in patients with h/o heart failure). 3. HTN - 1. Continue norvasc hydralazine, coreg 4. Chronic diastolic CHF - 1. Watch for exacerbation with treatment of AKI  and hydration. 5. H/o CAD - continue ASA, plavix 6. L 2nd toe ulcer - 1. Will get X ray 2. But doesn't seem very impressive for major infection, deep ulcer, etc 3. Doubt it is causing patients presentation at this point  DVT prophylaxis: Lovenox Code Status: DNR - confirmed with patient Family Communication: No family in room Disposition Plan: Home after admit Consults called: None Admission status: Place in obs   [place], [place] [facility] Pager [phone]  If 7AM-7PM, please contact day team taking care of patient [email or web address] Password [email or web address]  12/04/2016, 1:52 AM

## 2016-12-04 NOTE — Progress Notes (Signed)
Messaged MD about BP medications d/t BP 103/40, P 61. Awaiting response.   Larey Days, RN

## 2016-12-05 DIAGNOSIS — E860 Dehydration: Secondary | ICD-10-CM | POA: Diagnosis not present

## 2016-12-05 DIAGNOSIS — E1122 Type 2 diabetes mellitus with diabetic chronic kidney disease: Secondary | ICD-10-CM | POA: Diagnosis not present

## 2016-12-05 DIAGNOSIS — L97529 Non-pressure chronic ulcer of other part of left foot with unspecified severity: Secondary | ICD-10-CM | POA: Diagnosis not present

## 2016-12-05 DIAGNOSIS — Z794 Long term (current) use of insulin: Secondary | ICD-10-CM | POA: Diagnosis not present

## 2016-12-05 DIAGNOSIS — N179 Acute kidney failure, unspecified: Secondary | ICD-10-CM | POA: Diagnosis not present

## 2016-12-05 DIAGNOSIS — N183 Chronic kidney disease, stage 3 (moderate): Secondary | ICD-10-CM | POA: Diagnosis not present

## 2016-12-05 DIAGNOSIS — I5032 Chronic diastolic (congestive) heart failure: Secondary | ICD-10-CM | POA: Diagnosis not present

## 2016-12-05 DIAGNOSIS — I10 Essential (primary) hypertension: Secondary | ICD-10-CM | POA: Diagnosis not present

## 2016-12-05 LAB — GLUCOSE, CAPILLARY
GLUCOSE-CAPILLARY: 123 mg/dL — AB (ref 65–99)
GLUCOSE-CAPILLARY: 100 mg/dL — AB (ref 65–99)
GLUCOSE-CAPILLARY: 158 mg/dL — AB (ref 65–99)
GLUCOSE-CAPILLARY: 143 mg/dL — AB (ref 65–99)

## 2016-12-05 LAB — BASIC METABOLIC PANEL
ANION GAP: 4 — AB (ref 5–15)
BUN: 53 mg/dL — ABNORMAL HIGH (ref 6–20)
CALCIUM: 9.4 mg/dL (ref 8.9–10.3)
CHLORIDE: 108 mmol/L (ref 101–111)
CO2: 29 mmol/L (ref 22–32)
CREATININE: 1.44 mg/dL — AB (ref 0.44–1.00)
GFR calc non Af Amer: 33 mL/min — ABNORMAL LOW (ref >60)
GFR, EST AFRICAN AMERICAN: 38 mL/min — AB (ref >60)
Glucose, Bld: 91 mg/dL (ref 65–99)
Potassium: 3.7 mmol/L (ref 3.5–5.1)
SODIUM: 141 mmol/L (ref 135–145)

## 2016-12-05 NOTE — Progress Notes (Signed)
Progress Note    Samantha DARWIN  [name]:[id number] DOB: Jul 17, 1932  DOA: 12/03/2016 PCP: Renaye Rakers, MD    Brief Narrative:   Chief complaint: Follow-up weakness/AKI.  Medical records reviewed and are as summarized below:  [name] is an 83 y.o. female with a PMH of CAD, SSS with dual-chamber pacer, chronic diastolic CHF, hypertension and type 2 diabetes who was admitted 12/03/16 with a chief complaint of feeling weak all over associated with intermittent headache and nausea. In the ED, creatinine was 2.1 and she was noted to have a diabetic foot ulcer on the second toe of her left foot.  Assessment/Plan:   Principal Problems:   Generalized weakness with AKI (acute kidney injury) (HCC) on stage III CKD Given 1 L bolus in the ED and then IV fluids initiated 75 mL per hour. Diuretics and ACE inhibitor held. Creatinine improved to 2.15---> 1.85--->1.44. Baseline creatinine 1.1-1.2. Baseline GFR 45-51 consistent with stage III CKD. PT evaluation performed 12/04/16: Home health PT/24-hour supervision recommended.    Diabetic toe ulcer CT showed arthropathic changes throughout the midfoot and forefoot consistent with Charcot arthropathy and gout with nonspecific fragmentation of the second middle phalanx. There was no surrounding soft tissue findings suggestive of infection.  Active Problems:   Essential hypertension Currently being managed with Norvasc, hydralazine and Coreg. Chlorthalidone, Entresto and Lasix on hold. Blood pressure controlled, but gets dizzy after she takes her BP meds.  Will D/C hydralazine. Currently not orthostatic.    Chronic diastolic heart failure (HCC) 2-D echo 07/13/15 showed an EF of 55-60 percent.    DM (diabetes mellitus), type 2 with renal complications (HCC) Home regimen consists of 55 units of NPH twice a day and Actos. Currently being managed with Lantus 20 units daily and insulin sensitive SSI 3 times a day. CBG range 78-167.    Normocytic  anemia Likely from anemia of chronic kidney disease.    Obesity Body mass index is 35.08 kg/m.     Hypothyroidism Continue Synthroid.    Hyperlipidemia Continue statin.    History of sick sinus syndrome status post pacemaker    Gout Continue allopurinol and colchicine.    Hypokalemia Repleted.    CAD status post CABG in 2011, PCI with DES 2009 Continue aspirin, Plavix, and statin.  Family Communication/Anticipated D/C date and plan/Code Status   DVT prophylaxis: Lovenox ordered. Code Status:DO NOT RESUSCITATE.  Family Communication: No family at the bedside.  Disposition Plan: Home in hours of renal function back to baseline.   Medical Consultants:    None.   Anti-Infectives:    None  Subjective:   No complaints of dizziness today. PT notes that she has had improved activity tolerance today. No chest pain, nausea or vomiting.  Objective:    Vitals:   12/04/16 1442 12/04/16 1817 12/04/16 2141 12/05/16 0432  BP: (!) 129/35 (!) 134/50 (!) 103/40 (!) 136/45  Pulse: 60 75 61 60  Resp: 18 18 18 18   Temp:  98.9 F (37.2 C) 98.9 F (37.2 C) 98.5 F (36.9 C)  TempSrc:  Oral Oral Oral  SpO2: 100% 100% 100% 100%  Weight:   101.6 kg (224 lb)   Height:        Intake/Output Summary (Last 24 hours) at 12/05/16 0718 Last data filed at 12/05/16 9562  Gross per 24 hour  Intake              720 ml  Output  575 ml  Net              145 ml   Filed Weights   12/03/16 2234 12/04/16 2141  Weight: 99.3 kg (219 lb) 101.6 kg (224 lb)    Exam: General: No acute distress. Cardiovascular: Heart sounds show a regular rate, and rhythm. No gallops or rubs. No murmurs. No JVD. Lungs: Clear to auscultation bilaterally with good air movement. No rales, rhonchi or wheezes. Abdomen: Soft, nontender, nondistended with normal active bowel sounds. No masses. No hepatosplenomegaly. Neurological: Alert and oriented 3. Moves all extremities 4 with equal strength.  Cranial nerves II through XII grossly intact. Skin: Warm and dry. No rashes or lesions. Extremities: No clubbing or cyanosis. No edema. Pedal pulses 2+. Left second toe with nodular erosion. Onychomycosis. Psychiatric: Mood and affect are normal. Insight and judgment are good.  Data Reviewed:   I have personally reviewed following labs and imaging studies:  Labs: Labs show the following: Sodium 139, potassium 3.2, chloride 104, bicarbonate 25, BUN 69, creatinine 1.85, glucose 126. WBC 7.5, hemoglobin 10.5, hematocrit 33, platelets 167.  Troponins negative 2.  LFTs WNL. Protein 5.7, albumin 3.3.  Microbiology No results found for this or any previous visit (from the past 240 hour(s)).  Procedures and diagnostic studies:  12/04/16 Dg Chest 2 View: Stable appearance of the chest with aortic atherosclerosis and borderline cardiomegaly. No acute pneumonic consolidation or CHF.   12/04/16 Dg Foot 2 Views Left:  Poor visualization of the left second total due to overlap on the lateral projection. The second middle phalanx appearance abnormal and may be partially eroded. CT may provide better assessment of this bone. Alternatively, oblique radiographs could be considered.   12/05/16: Ct Foot Left Wo Contrast: 1. Arthropathic changes throughout the midfoot and forefoot, suspected to reflect a combination of Charcot arthropathy and possible gout. 2. Nonspecific fragmentation of the second middle phalanx. This could be the sequela of arthropathy, prior trauma or prior infection. The lack of impressive surrounding soft tissue findings argues against acute infection. 3. Dorsal forefoot soft tissue swelling. No focal abscess identified.   Medications:   . allopurinol  100 mg Oral Daily  . amLODipine  5 mg Oral q morning - 10a  . aspirin  81 mg Oral Daily  . atorvastatin  20 mg Oral Daily  . carvedilol  3.125 mg Oral BID WC  . clopidogrel  75 mg Oral Daily  . enoxaparin (LOVENOX) injection  40 mg  Subcutaneous Q24H  . hydrALAZINE  25 mg Oral TID  . insulin aspart  0-9 Units Subcutaneous TID WC  . insulin glargine  20 Units Subcutaneous Daily  . ketotifen  1 drop Both Eyes BID  . levothyroxine  100 mcg Oral QAC breakfast   Continuous Infusions: . sodium chloride 75 mL/hr at 12/04/16 1726     LOS: 0 days   Jaydrien Wassenaar  [facility] Pager [phone]. If unable to reach me by pager, please call my cell phone at [phone].  *Please refer to [email or web address], password [email or web address] to get updated schedule on who will round on this patient, as hospitalists switch teams weekly. If 7PM-7AM, please contact night-coverage at [email or web address], password [email or web address] for any overnight needs.  12/05/2016, 7:18 AM

## 2016-12-05 NOTE — Care Management Note (Signed)
Case Management Note  Patient Details  Name: [name] MRN: [id number] Date of Birth: 05/27/1932  Subjective/Objective:      CM following for progression and d/c planning.               Action/Plan: 12/05/2016 Met with pt and discussed OBS status as well as d/c plans. Pt lives with daughter who works during the day but is with the pt in the evening and at night. Pt has a rolling walker which she uses at home and is independent of ADLs generally. Will follow for d/c needs. She is agreeable to HHPT is ordered.   Expected Discharge Date:                  Expected Discharge Plan:  Home w Home Health Services  In-House Referral:  NA  Discharge planning Services  CM Consult  Post Acute Care Choice:  Home Health Choice offered to:  Patient  DME Arranged:    DME Agency:     HH Arranged:    HH Agency:     Status of Service:  In process, will continue to follow  If discussed at Long Length of Stay Meetings, dates discussed:    Additional Comments:  Royal, Cheryl U, RN 12/05/2016, 11:55 AM  

## 2016-12-05 NOTE — Care Management Note (Addendum)
Case Management Note  Patient Details  Name: [name] MRN: [id number] Date of Birth: 16-May-1932  Subjective/Objective:  CM following for progression and d/c planning.                   Action/Plan: 12/05/2016 Met with pt re d/c needs. Pt states that she is familiar with [facility] and wishes to use their services if [facility] is ordered. Noted PT eval recommending HHPT. [facility] notified and they will be able to offer HHPT to this pt , they will not be able to provide [facility] if ordered. This CM to monitor for d/c orders. Pt states that she has DME and has no further needs.  Noted MD plan to d/c this pt today. Per pt her daughter will be home after 5pm and can assist with transportation.   Expected Discharge Date:     12/05/2016             Expected Discharge Plan:  [place]  In-House Referral:  NA  Discharge planning Services  CM Consult  Post Acute Care Choice:  Home Health Choice offered to:  Patient  DME Arranged:   NA DME Agency:   NA  HH Arranged:  [place] Agency:  [place]  Status of Service:  Completed.   If discussed at [place] of Stay Meetings, dates discussed:    Additional Comments:  Adron Bene, RN 12/05/2016, 4:17 PM

## 2016-12-05 NOTE — Care Management Obs Status (Signed)
MEDICARE OBSERVATION STATUS NOTIFICATION   Patient Details  Name: [name] MRN: [id number] Date of Birth: 04/07/1933   Medicare Observation Status Notification Given:  Yes    Rosco Harriott, Annamarie Major, RN 12/05/2016, 11:54 AM

## 2016-12-05 NOTE — Progress Notes (Signed)
Physical Therapy Treatment Patient Details Name: [name] MRN: [id number] DOB: 02/25/1933 Today's Date: 12/05/2016    History of Present Illness Pt is an 83 y.o. female with a PMH of CAD, SSS with dual-chamber pacer, chronic diastolic CHF, hypertension and type 2 diabetes who was admitted 12/03/16 with a chief complaint of feeling weak all over associated with intermittent headache and nausea. In the ED, creatinine was 2.1 and she was noted to have a diabetic foot ulcer on the second toe of her left foot.    PT Comments    Continuing work on functional mobility and activity tolerance; Discussed with RN who tells me BP meds have been adjusted; Definitely with improved activity tolerance today, able to walk the hallways, asymptomatic for dizziness; Tells me her grandsons help her up the steps to enter her home   Follow Up Recommendations  Home health PT;Supervision/Assistance - 24 hour     Equipment Recommendations  None recommended by PT    Recommendations for Other Services       Precautions / Restrictions Precautions Precautions: Fall    Mobility  Bed Mobility               General bed mobility comments: Pt EOB upon arrival  Transfers Overall transfer level: Needs assistance Equipment used: Rolling walker (2 wheeled) Transfers: Sit to/from Stand Sit to Stand: Min guard         General transfer comment: Minguard for safety; Cues for hand placement; pt very much wants to perform task by herself  Ambulation/Gait Ambulation/Gait assistance: Min guard Ambulation Distance (Feet): 100 Feet Assistive device: Rolling walker (2 wheeled) Gait Pattern/deviations: Step-through pattern;Decreased step length - right;Decreased step length - left;Decreased stride length;Trunk flexed     General Gait Details: Cues to self-monitor for activity tolerance   Stairs            Wheelchair Mobility    Modified Rankin (Stroke Patients Only)       Balance                                             Cognition Arousal/Alertness: Awake/alert Behavior During Therapy: WFL for tasks assessed/performed Overall Cognitive Status: Within Functional Limits for tasks assessed                                        Exercises      General Comments General comments (skin integrity, edema, etc.): PT had just received morning meds (including a BP med) prior to PT session; no drop in BP between sitting and standing      Pertinent Vitals/Pain Pain Assessment: No/denies pain    Home Living                      Prior Function            PT Goals (current goals can now be found in the care plan section) Acute Rehab PT Goals Patient Stated Goal: home PT Goal Formulation: With patient Time For Goal Achievement: 12/18/16 Potential to Achieve Goals: Good Progress towards PT goals: Progressing toward goals    Frequency    Min 3X/week      PT Plan Current plan remains appropriate    Co-evaluation  AM-PAC PT "6 Clicks" Daily Activity  Outcome Measure  Difficulty turning over in bed (including adjusting bedclothes, sheets and blankets)?: A Little Difficulty moving from lying on back to sitting on the side of the bed? : A Lot Difficulty sitting down on and standing up from a chair with arms (e.g., wheelchair, bedside commode, etc,.)?: A Little Help needed moving to and from a bed to chair (including a wheelchair)?: A Little Help needed walking in hospital room?: A Little Help needed climbing 3-5 steps with a railing? : A Lot 6 Click Score: 16    End of Session Equipment Utilized During Treatment: Gait belt Activity Tolerance: Patient tolerated treatment well Patient left: in chair;with call bell/phone within reach Nurse Communication: Mobility status PT Visit Diagnosis: Muscle weakness (generalized) (M62.81);Difficulty in walking, not elsewhere classified (R26.2)     Time:  1030-1100 PT Time Calculation (min) (ACUTE ONLY): 30 min  Charges:  $Gait Training: 23-37 mins                    G Codes:       Van Clines, PT  Acute Rehabilitation Services Pager [phone] Office [phone]    Levi Aland 12/05/2016, 12:04 PM

## 2016-12-05 NOTE — Consult Note (Signed)
[facility] CM Primary Care Navigator  12/05/2016  [name] [birth year] [id number]   Met with patientat the bedside to identify possible discharge needs. Patient reports havinggeneralized weakness, dizziness, nausea and headache that had led to this admission. Patient endorses Dr. Lucianne Lei with [facility] as herprimary care provider.   Patientshared using [employer] and [facility] on [address] to obtain medications without difficulty so far.   Patient states she managesher medications at home straight out of the containers.  She reports that grandsons Biomedical engineer and Iona Beard) or daughter Verdis Frederickson) has been providing transportation to her doctors' appointments.  Patient lives with daughter (deaf) and grandsons who provide assistance with her care needs at home.   Anticipated discharge plan is home with home health services per patient.   Patient expressedunderstanding to call primary care provider's office when shereturns home, for a post discharge follow-up appointment within a week or sooner if needed.Patient letter (with PCP's contact number) was provided as areminder.  Patientwas made aware of [facility] care managementservices available for furtherhomemanagement and shedeniesany current needs or concerns for now.  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 She reports monitoring/ recording of blood sugars and weight (monitored directly by [facility]) as well as following diet restrictions and medications. She also mentioned following up with providers whenever needed.   Patient however, opted and verbally agreed to [place] calls to follow up with her recovery at home. Referral made for General EMMI calls for post discharge follow-up.  Patient voiced understandingto request referral from primary care provider to [facility] care management if deemed necessaryfor services in the future.  [facility]  care management information provided for future needs that may arise.    For questions, please contact:  Dannielle Huh, BSN, RN- [facility] Primary Care Navigator  Telephone: [phone] [place]

## 2016-12-05 NOTE — Discharge Summary (Signed)
Physician Discharge Summary  [name] UEA:[id number] DOB: 01-23-1933 DOA: 12/03/2016  PCP: Renaye Rakers, MD  Admit date: 12/03/2016 Discharge date: 12/05/2016  Admitted From: Home Discharge disposition: Home   Recommendations for Outpatient Follow-Up:   1. Needs F/U with PCP for BP check in 1 week.   Discharge Diagnosis:   Principal Problem:   AKI (acute kidney injury) (HCC) Active Problems:   Essential hypertension   Chronic diastolic heart failure (HCC)   DM (diabetes mellitus), type 2 with renal complications (HCC)   CKD (chronic kidney disease), stage III   Diabetic toe ulcer   Normocytic anemia   Obesity   Hyperlipidemia   H/O sick sinus syndrome s/p Pacemaker   Gout   Hypokalemia   CAD s/p CABG in 2011, PCI with DES in 2009  Discharge Condition: Improved.  Diet recommendation: Low sodium, heart healthy.  Carbohydrate-modified.  R  History of Present Illness:   [name] is an 83 y.o. female with a PMH of CAD, SSS with dual-chamber pacer, chronic diastolic CHF, hypertension and type 2 diabetes who was admitted 12/03/16 with a chief complaint of feeling weak all over associated with intermittent headache and nausea. In the ED, creatinine was 2.1 and she was noted to have a diabetic foot ulcer on the second toe of her left foot.  Hospital Course by Problem:    Principal Problems:   Generalized weakness with AKI (acute kidney injury) (HCC) on stage III CKD Given 1 L bolus in the ED and then IV fluids initiated 75 mL per hour. Diuretics and ACE inhibitor held. Creatinine improved to 2.15---> 1.85--->1.44. Baseline creatinine 1.1-1.2. Baseline GFR 45-51 consistent with stage III CKD. PT evaluation performed 12/04/16: Home health PT/24-hour supervision recommended.    Diabetic toe ulcer CT showed arthropathic changes throughout the midfoot and forefoot consistent with Charcot arthropathy and gout with nonspecific fragmentation of the second middle  phalanx. There was no surrounding soft tissue findings suggestive of infection.  Active Problems:   Essential hypertension All antihypertensives discontinued with the exception of Coreg. BP and renal parameters improved at discharge. Will need close follow-up with PCP for further adjustment of BP meds if indicated.    Chronic diastolic heart failure (HCC) 2-D echo 07/13/15 showed an EF of 55-60 percent.    DM (diabetes mellitus), type 2 with renal complications (HCC) Home regimen consists of 55 units of NPH twice a day and Actos. Currently being managed with Lantus 20 units daily and insulin sensitive SSI 3 times a day. CBG range 78-167. Okay to resume home regimen at discharge.    Normocytic anemia Likely from anemia of chronic kidney disease.    Obesity Body mass index is 35.08 kg/m.     Hypothyroidism Continue Synthroid.    Hyperlipidemia Continue statin.    History of sick sinus syndrome status post pacemaker    Gout Continue allopurinol and colchicine.    Hypokalemia Repleted.    CAD status post CABG in 2011, PCI with DES 2009 Continue aspirin, Plavix, and statin.    Medical Consultants:    None.   Discharge Exam:   Vitals:   12/05/16 1039 12/05/16 1653  BP: (!) 183/52 (!) 129/48  Pulse: 60 60  Resp:  18  Temp:  98.2 F (36.8 C)  SpO2:  100%   Vitals:   12/05/16 0432 12/05/16 0915 12/05/16 1039 12/05/16 1653  BP: (!) 136/45 (!) 152/47 (!) 183/52 (!) 129/48  Pulse: 60 60 60 60  Resp: 18 18  18  Temp: 98.5 F (36.9 C) 98.2 F (36.8 C)  98.2 F (36.8 C)  TempSrc: Oral Oral  Oral  SpO2: 100% 100%  100%  Weight:      Height:        General exam: Appears calm and comfortable.  Respiratory system: Clear to auscultation. Respiratory effort normal. Cardiovascular system: S1 & S2 heard, RRR. No JVD,  rubs, gallops or clicks. No murmurs. Gastrointestinal system: Abdomen is nondistended, soft and nontender. No organomegaly or masses felt.  Normal bowel sounds heard. Central nervous system: Alert and oriented. No focal neurological deficits. Extremities: No clubbing,  or cyanosis. No edema. Skin: No rashes, lesions or ulcers. Psychiatry: Judgement and insight appear normal. Mood & affect appropriate.    The results of significant diagnostics from this hospitalization (including imaging, microbiology, ancillary and laboratory) are listed below for reference.     Procedures and Diagnostic Studies:   Dg Chest 2 View  Result Date: 12/04/2016 CLINICAL DATA:  Weakness, headache and nausea EXAM: CHEST  2 VIEW COMPARISON:  10/06/2016. FINDINGS: Lung volumes are low with borderline cardiomegaly and aortic atherosclerosis. Right atrial and right ventricular pacing leads are again noted with left-sided pacemaker apparatus. No pneumonic consolidation, effusion nor overt pulmonary edema. No acute nor suspicious osseous abnormalities. IMPRESSION: Stable appearance of the chest with aortic atherosclerosis and borderline cardiomegaly. No acute pneumonic consolidation or CHF. Electronically Signed   By: Tollie Eth M.D.   On: 12/04/2016 00:50   Ct Foot Left Wo Contrast  Result Date: 12/04/2016 CLINICAL DATA:  Diabetic with ulcer on the second toe, pain and swelling. EXAM: CT OF THE LEFT FOOT WITHOUT CONTRAST TECHNIQUE: Multidetector CT imaging of the left foot was performed according to the standard protocol. Multiplanar CT image reconstructions were also generated. COMPARISON:  Foot radiographs 12/04/2016. FINDINGS: Bones/Joint/Cartilage The bones are demineralized. There are advanced arthropathic changes at the metatarsal phalangeal and interphalangeal joints of the great toe. There is associated marked joint space narrowing, osteophyte formation and erosions. These findings are suggestive of possible gout. As demonstrated radiographically, the second middle phalanx is fragmented and diminutive. The proximal phalanx appears normal. There are  possible small erosions of the distal phalanx. The additional phalanges appear unremarkable. Moderate degenerative changes are present at the Lisfranc joint with osteophytes and subchondral cyst formation, likely Charcot related. There are probable intraosseous ganglia within the navicular. Mild talonavicular degenerative changes are present. The subtalar and tibiotalar joints demonstrate no significant findings. Ligaments Suboptimally assessed by CT. Muscles and Tendons As evaluated by CT, the ankle and foot tendons appear intact. There is diffuse muscular atrophy, attributed to diabetes. No focal intramuscular fluid collections are seen. Soft tissues There is dorsal subcutaneous edema throughout the forefoot. No focal fluid collections are identified. There is probable mild soft tissue swelling in the second toe. Known ulcer is not well visualized. There is no evidence of soft tissue abscess, foreign body or soft tissue emphysema. IMPRESSION: 1. Arthropathic changes throughout the midfoot and forefoot, suspected to reflect a combination of Charcot arthropathy and possible gout. 2. Nonspecific fragmentation of the second middle phalanx. This could be the sequela of arthropathy, prior trauma or prior infection. The lack of impressive surrounding soft tissue findings argues against acute infection. 3. Dorsal forefoot soft tissue swelling. No focal abscess identified. Electronically Signed   By: Carey Bullocks M.D.   On: 12/04/2016 09:30   Dg Foot 2 Views Left  Result Date: 12/04/2016 CLINICAL DATA:  Left second toe ulcer EXAM: LEFT  FOOT - 2 VIEW COMPARISON:  None. FINDINGS: The bones are osteopenic. The reported ulcer of second toe is not visible. This may be due to overlap of soft tissues on the lateral view. There is angulation and possible destruction of the middle phalanx of the second toe. No other focal osseous abnormality. IMPRESSION: Poor visualization of the left second total due to overlap on the  lateral projection. The second middle phalanx appearance abnormal and may be partially eroded. CT may provide better assessment of this bone. Alternatively, oblique radiographs could be considered. Electronically Signed   By: Deatra Robinson M.D.   On: 12/04/2016 06:26     Labs:   Basic Metabolic Panel:  Recent Labs Lab 12/03/16 2310 12/04/16 0153 12/05/16 0732  NA 138 139 141  K 3.8 3.2* 3.7  CL 102 104 108  CO2 26 25 29   GLUCOSE 196* 126* 91  BUN 72* 69* 53*  CREATININE 2.15* 1.85* 1.44*  CALCIUM 9.8 9.3 9.4   GFR Estimated Creatinine Clearance: 36.9 mL/min (A) (by C-G formula based on SCr of 1.44 mg/dL (H)). Liver Function Tests:  Recent Labs Lab 12/03/16 2310  AST 23  ALT 14  ALKPHOS 58  BILITOT 0.5  PROT 5.7*  ALBUMIN 3.3*    CBC:  Recent Labs Lab 12/03/16 2310 12/04/16 0153  WBC 6.6 7.5  NEUTROABS 4.5  --   HGB 10.9* 10.5*  HCT 34.4* 33.0*  MCV 94.8 93.2  PLT 164 167   Cardiac Enzymes:  Recent Labs Lab 12/03/16 2310 12/04/16 0153 12/04/16 0738  TROPONINI <0.03 <0.03 <0.03   CBG:  Recent Labs Lab 12/04/16 1712 12/04/16 2117 12/05/16 0736 12/05/16 1153 12/05/16 1652  GLUCAP 128* 149* 100* 158* 143*     Discharge Instructions:   Discharge Instructions    (HEART FAILURE PATIENTS) Call MD:  Anytime you have any of the following symptoms: 1) 3 pound weight gain in 24 hours or 5 pounds in 1 week 2) shortness of breath, with or without a dry hacking cough 3) swelling in the hands, feet or stomach 4) if you have to sleep on extra pillows at night in order to breathe.    Complete by:  As directed    Call MD for:  extreme fatigue    Complete by:  As directed    Call MD for:  persistant dizziness or light-headedness    Complete by:  As directed    Call MD for:  persistant nausea and vomiting    Complete by:  As directed    Diet - low sodium heart healthy    Complete by:  As directed    Diet Carb Modified    Complete by:  As directed     Increase activity slowly    Complete by:  As directed      Allergies as of 12/05/2016      Reactions   Ciprofloxacin Nausea And Vomiting   syncope   Codeine Other (See Comments)   HALLUCINATIONS   Hydrocodone Other (See Comments)   Makes her pass out   Penicillins Hives   Shellfish Allergy Hives   Diltiazem Hcl Other (See Comments)   : low heart rate   Sulfonamide Derivatives Nausea And Vomiting   Lovastatin Other (See Comments)   Pt doesn't remember a reaction   Metformin Other (See Comments)   diarrhea      Medication List    STOP taking these medications   amLODipine 5 MG tablet Commonly known as:  NORVASC  chlorthalidone 25 MG tablet Commonly known as:  HYGROTON   ENTRESTO 24-26 MG Generic drug:  sacubitril-valsartan   furosemide 40 MG tablet Commonly known as:  LASIX   hydrALAZINE 25 MG tablet Commonly known as:  APRESOLINE     TAKE these medications   allopurinol 100 MG tablet Commonly known as:  ZYLOPRIM Take 100 mg by mouth daily.   aspirin 81 MG tablet Take 81 mg by mouth daily.   atorvastatin 20 MG tablet Commonly known as:  LIPITOR Take 1 tablet (20 mg total) by mouth daily.   azelastine 0.05 % ophthalmic solution Commonly known as:  OPTIVAR Place 1 drop into both eyes 2 (two) times daily.   carvedilol 3.125 MG tablet Commonly known as:  COREG Take 1 tablet (3.125 mg total) by mouth 2 (two) times daily.   clopidogrel 75 MG tablet Commonly known as:  PLAVIX TAKE 1 TABLET BY MOUTH  DAILY   colchicine 0.6 MG tablet Take 0.6 mg by mouth daily as needed (gout).   HUMULIN N 100 UNIT/ML injection Generic drug:  insulin NPH Human Inject 10-45 Units into the skin 2 (two) times daily before a meal. 45 units in the morning and 10 units at bedtime   levothyroxine 100 MCG tablet Commonly known as:  SYNTHROID, LEVOTHROID Take 1 tablet (100 mcg total) by mouth daily.   NITROSTAT 0.4 MG SL tablet Generic drug:  nitroGLYCERIN DISSOLVE ONE  TABLET UNDER THE TONGUE EVERY 5 MINUTES AS NEEDED FOR CHEST PAIN.  DO NOT EXCEED A TOTAL OF 3 DOSES IN 15 MINUTES   ONE TOUCH ULTRA TEST test strip Generic drug:  glucose blood 1 each by Other route daily as needed (blood sugar).   pioglitazone 15 MG tablet Commonly known as:  ACTOS Take 15 mg by mouth daily.            Discharge Care Instructions        Start     Ordered   12/05/16 0000  Increase activity slowly     12/05/16 1652   12/05/16 0000  Diet - low sodium heart healthy     12/05/16 1652   12/05/16 0000  Diet Carb Modified     12/05/16 1652   12/05/16 0000  Call MD for:  persistant dizziness or light-headedness     12/05/16 1652   12/05/16 0000  Call MD for:  extreme fatigue     12/05/16 1652   12/05/16 0000  Call MD for:  persistant nausea and vomiting     12/05/16 1652   12/05/16 0000  (HEART FAILURE PATIENTS) Call MD:  Anytime you have any of the following symptoms: 1) 3 pound weight gain in 24 hours or 5 pounds in 1 week 2) shortness of breath, with or without a dry hacking cough 3) swelling in the hands, feet or stomach 4) if you have to sleep on extra pillows at night in order to breathe.     12/05/16 1655     Follow-up Information    Renaye Rakers, MD Follow up in 1 week(s).   Specialty:  Family Medicine Why:  Repeat blood pressure check, adjustment of medicines for blood pressure. Contact information: [address] [place] [address] [phone]            Time coordinating discharge: 25 minutes.  Signed:  RAMA,CHRISTINA  Pager [phone] [facility] 12/05/2016, 5:16 PM

## 2016-12-05 NOTE — Progress Notes (Signed)
Patient discharged to home with daughter. All discharge instructions reviewed with patient. All followup appts reviewed and changes in medications. IV removed. All belongings with patient. Patient left unit in stable condition via wheelchair.  Avelina Laine RN

## 2016-12-06 DIAGNOSIS — E1122 Type 2 diabetes mellitus with diabetic chronic kidney disease: Secondary | ICD-10-CM | POA: Diagnosis not present

## 2016-12-06 DIAGNOSIS — Z951 Presence of aortocoronary bypass graft: Secondary | ICD-10-CM | POA: Diagnosis not present

## 2016-12-06 DIAGNOSIS — Z950 Presence of cardiac pacemaker: Secondary | ICD-10-CM | POA: Diagnosis not present

## 2016-12-06 DIAGNOSIS — D631 Anemia in chronic kidney disease: Secondary | ICD-10-CM | POA: Diagnosis not present

## 2016-12-06 DIAGNOSIS — I5032 Chronic diastolic (congestive) heart failure: Secondary | ICD-10-CM | POA: Diagnosis not present

## 2016-12-06 DIAGNOSIS — L97529 Non-pressure chronic ulcer of other part of left foot with unspecified severity: Secondary | ICD-10-CM | POA: Diagnosis not present

## 2016-12-06 DIAGNOSIS — Z8679 Personal history of other diseases of the circulatory system: Secondary | ICD-10-CM | POA: Diagnosis not present

## 2016-12-06 DIAGNOSIS — I130 Hypertensive heart and chronic kidney disease with heart failure and stage 1 through stage 4 chronic kidney disease, or unspecified chronic kidney disease: Secondary | ICD-10-CM | POA: Diagnosis not present

## 2016-12-06 DIAGNOSIS — E11621 Type 2 diabetes mellitus with foot ulcer: Secondary | ICD-10-CM | POA: Diagnosis not present

## 2016-12-06 DIAGNOSIS — N183 Chronic kidney disease, stage 3 (moderate): Secondary | ICD-10-CM | POA: Diagnosis not present

## 2016-12-06 DIAGNOSIS — I2510 Atherosclerotic heart disease of native coronary artery without angina pectoris: Secondary | ICD-10-CM | POA: Diagnosis not present

## 2016-12-06 DIAGNOSIS — Z794 Long term (current) use of insulin: Secondary | ICD-10-CM | POA: Diagnosis not present

## 2016-12-06 DIAGNOSIS — M109 Gout, unspecified: Secondary | ICD-10-CM | POA: Diagnosis not present

## 2016-12-06 DIAGNOSIS — N179 Acute kidney failure, unspecified: Secondary | ICD-10-CM | POA: Diagnosis not present

## 2016-12-07 ENCOUNTER — Ambulatory Visit (INDEPENDENT_AMBULATORY_CARE_PROVIDER_SITE_OTHER): Payer: Medicare Other | Admitting: Podiatry

## 2016-12-07 ENCOUNTER — Encounter: Payer: Self-pay | Admitting: Podiatry

## 2016-12-07 DIAGNOSIS — M7752 Other enthesopathy of left foot: Secondary | ICD-10-CM | POA: Diagnosis not present

## 2016-12-07 DIAGNOSIS — L84 Corns and callosities: Secondary | ICD-10-CM | POA: Diagnosis not present

## 2016-12-07 MED ORDER — BETAMETHASONE SOD PHOS & ACET 6 (3-3) MG/ML IJ SUSP: 3.0000 mg | Freq: Once | INTRAMUSCULAR | Status: DC

## 2016-12-07 NOTE — Progress Notes (Signed)
   Subjective: Patient presents to the office today for chief complaint of painful callus lesion  With discoloration to the second digit left foot medial aspect. Toe is exquisitely painful to palpation. Patient denies trauma.. Patient states that the pain is ongoing and is affecting their ability to ambulate without pain. Patient presents today for further treatment and evaluation.  Objective:  Physical Exam General: Alert and oriented x3 in no acute distress  Dermatology: Hyperkeratotic lesion present on the  Medial aspect second digit left foot. Pain on palpation with a central nucleated core noted.  Skin is warm, dry and supple bilateral lower extremities. Negative for open lesions or macerations.  Vascular: Palpable pedal pulses bilaterally. No edema or erythema noted. Capillary refill within normal limits.  Neurological: Epicritic and protective threshold grossly intact bilaterally.   Musculoskeletal Exam: Pain on palpation at the keratotic lesion noted. Range of motion within normal limits bilateral. Muscle strength 5/5 in all groups bilateral.  Assessment: #1  Interdigital corn second digit left foot medial aspect  #2 capsulitis toe second digit   Plan of Care:  #1 Patient evaluated #2 Excisional debridement of keratoic lesion using a chisel blade was performed without incident.  #3 injection of 0.5 mL Celestone Soluspan injected into the PIPJ second digit left foot #4 Patient is to return to the clinic PRN.   Felecia ShellingBrent M. Joann Kulpa, DPM [facility]  Dr. Felecia ShellingBrent M. Jeramie Scogin, DPM    [address]                                        [place], [place] [address]                Office [phone]  Fax [phone]

## 2016-12-08 NOTE — ED Provider Notes (Signed)
[facility] DEPT Provider Note   CSN: [id number] Arrival date & time: 12/03/16  2221     History   Chief Complaint Chief Complaint  Patient presents with  . Weakness    HPI [name] is a 83 y.o. female.  HPI   presents to ER with compalints of generalized weakness with HA and nausea. Denies CP and SOB. Reports no abdominal pain. Reports some anorexia. No dysuria or urinary frequency. Family reports no AMS      Past Medical History:  Diagnosis Date  . Allergic rhinitis   . Anemia   . Anxiety   . Barrett esophagus   . CAD (coronary artery disease) 2009   a. Multivessel s/p PCI w/DES 2009 // b. s/p CABG 2011  //  c. LHC 8/15: pLAD 95 ISR, LCx 100, pOM1 40, dRCA 100, S-OM1/OM2 ok, S-D1 ok, S-PDA ok, L-LAD ok, EF 60%  . Carotid artery disease ([place])    a. Carotid US 9/03: RICA 0-09%; LICA 23-30% >> FU 1 year  //  b. Carotid US 9/17: R 1-39%, L 40-59% >> FU 1 year  . Chronic diastolic heart failure ([place])   . CKD (chronic kidney disease), stage II    GFR 60-89 ml/min  . Depression   . Disc disease, degenerative, cervical   . Diverticulosis   . Gastroparesis   . GERD (gastroesophageal reflux disease)   . Gout   . H/O hiatal hernia   . Helicobacter pylori gastritis   . History of echocardiogram    a. Echo 11/13: EF 55% to 60%. Grade 2 diastolic dysfunction, MAC, trivial MR, mild LAE, normal RVSF, mild RAE, PASP 39 mmHg  //  b. Echo 4/17: EF 55-60%, normal wall motion, trivial AI, MAC, moderate LAE, mild RVE, PASP 35 mmHg  . History of thrombocytopenia   . HTN (hypertension)   . Hyperlipidemia   . Hypothyroidism   . LBP (low back pain)    Lumbar disc disease/lumbar spinal stenosis  . Morbid obesity ([place])   . Myocardial infarction ([place])   . Osteoarthritis   . Osteopenia   . PVD (peripheral vascular disease) ([place])   . Sick sinus syndrome [facility])    MDT Dual-chamber PPM implant 02/2012  . Type II or unspecified type diabetes mellitus without mention of  complication, not stated as uncontrolled     Patient Active Problem List   Diagnosis Date Noted  . CKD (chronic kidney disease), stage III 12/04/2016  . Chest pain 11/23/2015  . Unstable angina ([place]) 11/29/2013  . Type I (juvenile type) diabetes mellitus with renal manifestations, not stated as uncontrolled(250.41) 04/27/2012  . DM (diabetes mellitus), type 2 with renal complications ([place]) 07/62/2633  . AKI (acute kidney injury) ([place]) 03/05/2012  . Diarrhea 03/05/2012  . Carotid artery disease ([place]) 08/11/2010  . Left shoulder pain 07/08/2010  . Preventative health care 07/08/2010  . HOARSENESS 06/04/2010  . PRURITUS 05/11/2010  . ANEMIA-NOS 04/02/2010  . Chronic diastolic heart failure ([place]) 02/11/2010  . Acute on chronic diastolic heart failure ([place]) 11/26/2009  . SINUS BRADYCARDIA 10/30/2009  . ALLERGIC RHINITIS 09/11/2009  . BACK PAIN 09/11/2009  . MUSCLE STRAIN, RIGHT BUTTOCK 09/11/2009  . SHINGLES 05/11/2009  . SHOULDER PAIN, LEFT 12/29/2008  . Proteinuria 12/12/2008  . Abdominal pain, unspecified site 09/24/2007  . Coronary atherosclerosis 05/03/2007  . CHEST PAIN 04/20/2007  . DIZZINESS 02/28/2007  . ANXIETY 02/15/2007  . GERD 02/15/2007  . Gastroparesis 02/15/2007  . [place] DISEASE, CERVICAL 02/15/2007  . [place]  DISEASE, LUMBAR 02/15/2007  . SPINAL STENOSIS, LUMBAR 02/15/2007  . PERIPHERAL EDEMA 02/15/2007  . Personal History of Other Diseases of Digestive Disease 02/15/2007  . HYPERLIPIDEMIA 01/01/2007  . GOUT 01/01/2007  . Morbid obesity ([place]) 01/01/2007  . DEPRESSION 01/01/2007  . PERIPHERAL VASCULAR DISEASE 01/01/2007  . DIVERTICULOSIS, COLON 01/01/2007  . OSTEOARTHRITIS 01/01/2007  . LOW BACK PAIN 01/01/2007  . OSTEOPENIA 01/01/2007  . Essential hypertension 10/26/2006    Past Surgical History:  Procedure Laterality Date  . ABDOMINAL HYSTERECTOMY    . CARDIAC CATHETERIZATION     2011  DR COOPER (APPT NEXT WEEK)  . CHOLECYSTECTOMY    . CORONARY ARTERY  BYPASS GRAFT  2011   LIMA-LAD, SVG-DIAG, SVG-OM1-OM2, SVG-PDA  . CORONARY STENT PLACEMENT     Drug-eluting stent to the left anterior descending, circumflex and right coronary artery in Jan 2009  . EYE SURGERY     BIL CATARACT REMOVAL 06/2010  . LEFT HEART CATHETERIZATION WITH CORONARY ANGIOGRAM N/A 12/02/2013   Procedure: LEFT HEART CATHETERIZATION WITH CORONARY ANGIOGRAM;  Surgeon: Sinclair Grooms, MD;  Location: [facility] CATH LAB;  Service: Cardiovascular;  Laterality: N/A;  . OVARIAN CYST REMOVAL    . PACEMAKER INSERTION  03/06/12   MDT Adapta L implanted by Dr Rayann Heman for SSS  . PERMANENT PACEMAKER INSERTION N/A 03/06/2012   Procedure: PERMANENT PACEMAKER INSERTION;  Surgeon: Thompson Grayer, MD;  Location[facility] LAB;  Service: Cardiovascular;  Laterality: N/A;  . SHOULDER ARTHROSCOPY  06/16/2011   Procedure: ARTHROSCOPY SHOULDER;  Surgeon: Sharmon Revere, MD;  Location: [facility];  Service: Orthopedics;  Laterality: Left;  LEFT SHOULDER ARTHROSCOPY ACROMIALPLASTY, POSSIBLE MINI OPEN CUFF REPAIR   . TUBAL LIGATION      OB History    No data available       Home Medications    Prior to Admission medications   Medication Sig Start Date End Date Taking? Authorizing Provider  allopurinol (ZYLOPRIM) 100 MG tablet Take 100 mg by mouth daily.   Yes [provider]  aspirin 81 MG tablet Take 81 mg by mouth daily.   Yes [provider]  atorvastatin (LIPITOR) 20 MG tablet Take 1 tablet (20 mg total) by mouth daily. 12/15/15  Yes Sherren Mocha, MD  azelastine (OPTIVAR) 0.05 % ophthalmic solution Place 1 drop into both eyes 2 (two) times daily. 10/26/15  Yes [provider]  carvedilol (COREG) 3.125 MG tablet Take 1 tablet (3.125 mg total) by mouth 2 (two) times daily. 11/02/16  Yes Richardson Dopp T, PA-C  clopidogrel (PLAVIX) 75 MG tablet TAKE 1 TABLET BY MOUTH  DAILY 11/29/16  Yes Sherren Mocha, MD  colchicine 0.6 MG tablet Take 0.6 mg by mouth daily as needed (gout).    Yes  [provider]  HUMULIN N 100 UNIT/ML injection Inject 10-45 Units into the skin 2 (two) times daily before a meal. 45 units in the morning and 10 units at bedtime 07/06/15  Yes [provider]  levothyroxine (SYNTHROID, LEVOTHROID) 100 MCG tablet Take 1 tablet (100 mcg total) by mouth daily. 03/13/12  Yes Renato Shin, MD  NITROSTAT 0.4 MG SL tablet DISSOLVE ONE TABLET UNDER THE TONGUE EVERY 5 MINUTES AS NEEDED FOR CHEST PAIN.  DO NOT EXCEED A TOTAL OF 3 DOSES IN 15 MINUTES 12/04/15  Yes Sherren Mocha, MD  pioglitazone (ACTOS) 15 MG tablet Take 15 mg by mouth daily.  07/06/15  Yes [provider]  ONE TOUCH ULTRA TEST test strip 1 each by  Other route daily as needed (blood sugar).  07/10/13   [provider]    Family History Family History  Problem Relation Age of Onset  . Diabetes Mother   . Hypertension Mother   . Heart attack Mother   . Stroke Father   . Coronary artery disease Other     Social History Social History  Substance Use Topics  . Smoking status: Former Research scientist (life sciences)  . Smokeless tobacco: Never Used     Comment: quit 30 yrs ago  . Alcohol use No     Allergies   Ciprofloxacin; Codeine; Hydrocodone; Penicillins; Shellfish allergy; Diltiazem hcl; Sulfonamide derivatives; Lovastatin; and Metformin   Review of Systems Review of Systems  All other systems reviewed and are negative.    Physical Exam Updated Vital Signs BP (!) 129/48 (BP Location: Right Arm)   Pulse 60   Temp 98.2 F (36.8 C) (Oral)   Resp 18   Ht 5' 7"  (1.702 m)   Wt 101.6 kg (224 lb)   SpO2 100%   BMI 35.08 kg/m   Physical Exam  Constitutional: She is oriented to person, place, and time. She appears well-developed and well-nourished. No distress.  HENT:  Head: Normocephalic and atraumatic.  Eyes: EOM are normal.  Neck: Normal range of motion.  Cardiovascular: Normal rate, regular rhythm and normal heart sounds.   Pulmonary/Chest: Effort normal and breath  sounds normal.  Abdominal: Soft. She exhibits no distension. There is no tenderness.  Musculoskeletal: Normal range of motion.  Neurological: She is alert and oriented to person, place, and time.  Skin: Skin is warm and dry.  Psychiatric: She has a normal mood and affect. Judgment normal.  Nursing note and vitals reviewed.    ED Treatments / Results  Labs (all labs ordered are listed, but only abnormal results are displayed)   EKG  EKG Interpretation  Date/Time:  Saturday December 03 2016 22:55:25 EDT Ventricular Rate:  62 PR Interval:    QRS Duration: 208 QT Interval:  509 QTC Calculation: 517 R Axis:   -68 Text Interpretation:  Sinus rhythm Atrial premature complexes Borderline prolonged PR interval Nonspecific IVCD with LAD LVH with secondary repolarization abnormality Baseline wander in lead(s) V2 probable pacemaker spikes No significant change since last tracing EARLIER SAME DATE Confirmed by Rolland Porter [phone]) on 12/04/2016 5:40:00 PM       Radiology No results found.  Procedures Procedures (including critical care time)  Medications Ordered in ED Medications  ondansetron (ZOFRAN) injection 4 mg (4 mg Intravenous Given 12/03/16 2358)  sodium chloride 0.9 % bolus 1,000 mL (1,000 mLs Intravenous New Bag/Given 12/04/16 0110)  potassium chloride SA (K-DUR,KLOR-CON) CR tablet 40 mEq (40 mEq Oral Given 12/04/16 0815)     Initial Impression / Assessment and Plan / ED Course  I have reviewed the triage vital signs and the nursing notes.  Pertinent labs & imaging results that were available during my care of the patient were reviewed by me and considered in my medical decision making (see chart for details).     Will admit for acute kidney injury and generalized weakness. hospitalist admission  Final Clinical Impressions(s) / ED Diagnoses   Final diagnoses:  Ulcer of left second toe ([place])  Type 2 diabetes mellitus with stage 3 chronic kidney disease, with long-term  current use of insulin [facility])    New Prescriptions Discharge Medication List as of 12/05/2016  5:12 PM       Jola Schmidt, MD 12/08/16 [phone]

## 2016-12-09 DIAGNOSIS — I509 Heart failure, unspecified: Secondary | ICD-10-CM | POA: Diagnosis not present

## 2016-12-13 DIAGNOSIS — M109 Gout, unspecified: Secondary | ICD-10-CM | POA: Diagnosis not present

## 2016-12-13 DIAGNOSIS — I2510 Atherosclerotic heart disease of native coronary artery without angina pectoris: Secondary | ICD-10-CM | POA: Diagnosis not present

## 2016-12-13 DIAGNOSIS — Z950 Presence of cardiac pacemaker: Secondary | ICD-10-CM | POA: Diagnosis not present

## 2016-12-13 DIAGNOSIS — L97529 Non-pressure chronic ulcer of other part of left foot with unspecified severity: Secondary | ICD-10-CM | POA: Diagnosis not present

## 2016-12-13 DIAGNOSIS — Z794 Long term (current) use of insulin: Secondary | ICD-10-CM | POA: Diagnosis not present

## 2016-12-13 DIAGNOSIS — N179 Acute kidney failure, unspecified: Secondary | ICD-10-CM | POA: Diagnosis not present

## 2016-12-13 DIAGNOSIS — E11621 Type 2 diabetes mellitus with foot ulcer: Secondary | ICD-10-CM | POA: Diagnosis not present

## 2016-12-13 DIAGNOSIS — Z8679 Personal history of other diseases of the circulatory system: Secondary | ICD-10-CM | POA: Diagnosis not present

## 2016-12-13 DIAGNOSIS — Z951 Presence of aortocoronary bypass graft: Secondary | ICD-10-CM | POA: Diagnosis not present

## 2016-12-13 DIAGNOSIS — I5032 Chronic diastolic (congestive) heart failure: Secondary | ICD-10-CM | POA: Diagnosis not present

## 2016-12-13 DIAGNOSIS — D631 Anemia in chronic kidney disease: Secondary | ICD-10-CM | POA: Diagnosis not present

## 2016-12-13 DIAGNOSIS — N183 Chronic kidney disease, stage 3 (moderate): Secondary | ICD-10-CM | POA: Diagnosis not present

## 2016-12-13 DIAGNOSIS — E1122 Type 2 diabetes mellitus with diabetic chronic kidney disease: Secondary | ICD-10-CM | POA: Diagnosis not present

## 2016-12-13 DIAGNOSIS — I130 Hypertensive heart and chronic kidney disease with heart failure and stage 1 through stage 4 chronic kidney disease, or unspecified chronic kidney disease: Secondary | ICD-10-CM | POA: Diagnosis not present

## 2016-12-14 DIAGNOSIS — I10 Essential (primary) hypertension: Secondary | ICD-10-CM | POA: Diagnosis not present

## 2016-12-14 DIAGNOSIS — I509 Heart failure, unspecified: Secondary | ICD-10-CM | POA: Diagnosis not present

## 2016-12-14 DIAGNOSIS — H3563 Retinal hemorrhage, bilateral: Secondary | ICD-10-CM | POA: Diagnosis not present

## 2016-12-14 DIAGNOSIS — H35043 Retinal micro-aneurysms, unspecified, bilateral: Secondary | ICD-10-CM | POA: Diagnosis not present

## 2016-12-14 DIAGNOSIS — E11319 Type 2 diabetes mellitus with unspecified diabetic retinopathy without macular edema: Secondary | ICD-10-CM | POA: Diagnosis not present

## 2016-12-14 DIAGNOSIS — H35033 Hypertensive retinopathy, bilateral: Secondary | ICD-10-CM | POA: Diagnosis not present

## 2016-12-15 DIAGNOSIS — E11621 Type 2 diabetes mellitus with foot ulcer: Secondary | ICD-10-CM | POA: Diagnosis not present

## 2016-12-15 DIAGNOSIS — Z794 Long term (current) use of insulin: Secondary | ICD-10-CM | POA: Diagnosis not present

## 2016-12-15 DIAGNOSIS — I130 Hypertensive heart and chronic kidney disease with heart failure and stage 1 through stage 4 chronic kidney disease, or unspecified chronic kidney disease: Secondary | ICD-10-CM | POA: Diagnosis not present

## 2016-12-15 DIAGNOSIS — M109 Gout, unspecified: Secondary | ICD-10-CM | POA: Diagnosis not present

## 2016-12-15 DIAGNOSIS — N183 Chronic kidney disease, stage 3 (moderate): Secondary | ICD-10-CM | POA: Diagnosis not present

## 2016-12-15 DIAGNOSIS — Z950 Presence of cardiac pacemaker: Secondary | ICD-10-CM | POA: Diagnosis not present

## 2016-12-15 DIAGNOSIS — D631 Anemia in chronic kidney disease: Secondary | ICD-10-CM | POA: Diagnosis not present

## 2016-12-15 DIAGNOSIS — L97529 Non-pressure chronic ulcer of other part of left foot with unspecified severity: Secondary | ICD-10-CM | POA: Diagnosis not present

## 2016-12-15 DIAGNOSIS — I5032 Chronic diastolic (congestive) heart failure: Secondary | ICD-10-CM | POA: Diagnosis not present

## 2016-12-15 DIAGNOSIS — Z8679 Personal history of other diseases of the circulatory system: Secondary | ICD-10-CM | POA: Diagnosis not present

## 2016-12-15 DIAGNOSIS — N179 Acute kidney failure, unspecified: Secondary | ICD-10-CM | POA: Diagnosis not present

## 2016-12-15 DIAGNOSIS — I2510 Atherosclerotic heart disease of native coronary artery without angina pectoris: Secondary | ICD-10-CM | POA: Diagnosis not present

## 2016-12-15 DIAGNOSIS — E1122 Type 2 diabetes mellitus with diabetic chronic kidney disease: Secondary | ICD-10-CM | POA: Diagnosis not present

## 2016-12-15 DIAGNOSIS — Z951 Presence of aortocoronary bypass graft: Secondary | ICD-10-CM | POA: Diagnosis not present

## 2016-12-19 DIAGNOSIS — I5032 Chronic diastolic (congestive) heart failure: Secondary | ICD-10-CM | POA: Diagnosis not present

## 2016-12-19 DIAGNOSIS — I2510 Atherosclerotic heart disease of native coronary artery without angina pectoris: Secondary | ICD-10-CM | POA: Diagnosis not present

## 2016-12-19 DIAGNOSIS — E11621 Type 2 diabetes mellitus with foot ulcer: Secondary | ICD-10-CM | POA: Diagnosis not present

## 2016-12-19 DIAGNOSIS — I130 Hypertensive heart and chronic kidney disease with heart failure and stage 1 through stage 4 chronic kidney disease, or unspecified chronic kidney disease: Secondary | ICD-10-CM | POA: Diagnosis not present

## 2016-12-19 DIAGNOSIS — N183 Chronic kidney disease, stage 3 (moderate): Secondary | ICD-10-CM | POA: Diagnosis not present

## 2016-12-19 DIAGNOSIS — Z8679 Personal history of other diseases of the circulatory system: Secondary | ICD-10-CM | POA: Diagnosis not present

## 2016-12-19 DIAGNOSIS — E1122 Type 2 diabetes mellitus with diabetic chronic kidney disease: Secondary | ICD-10-CM | POA: Diagnosis not present

## 2016-12-19 DIAGNOSIS — Z950 Presence of cardiac pacemaker: Secondary | ICD-10-CM | POA: Diagnosis not present

## 2016-12-19 DIAGNOSIS — N179 Acute kidney failure, unspecified: Secondary | ICD-10-CM | POA: Diagnosis not present

## 2016-12-19 DIAGNOSIS — Z951 Presence of aortocoronary bypass graft: Secondary | ICD-10-CM | POA: Diagnosis not present

## 2016-12-19 DIAGNOSIS — L97529 Non-pressure chronic ulcer of other part of left foot with unspecified severity: Secondary | ICD-10-CM | POA: Diagnosis not present

## 2016-12-19 DIAGNOSIS — M109 Gout, unspecified: Secondary | ICD-10-CM | POA: Diagnosis not present

## 2016-12-19 DIAGNOSIS — Z794 Long term (current) use of insulin: Secondary | ICD-10-CM | POA: Diagnosis not present

## 2016-12-19 DIAGNOSIS — D631 Anemia in chronic kidney disease: Secondary | ICD-10-CM | POA: Diagnosis not present

## 2016-12-20 ENCOUNTER — Other Ambulatory Visit: Payer: Self-pay | Admitting: Cardiovascular Disease

## 2016-12-20 DIAGNOSIS — E785 Hyperlipidemia, unspecified: Secondary | ICD-10-CM

## 2016-12-21 ENCOUNTER — Other Ambulatory Visit: Payer: Self-pay | Admitting: Physician Assistant

## 2016-12-21 DIAGNOSIS — I5032 Chronic diastolic (congestive) heart failure: Secondary | ICD-10-CM

## 2016-12-22 DIAGNOSIS — N179 Acute kidney failure, unspecified: Secondary | ICD-10-CM | POA: Diagnosis not present

## 2016-12-22 DIAGNOSIS — E11621 Type 2 diabetes mellitus with foot ulcer: Secondary | ICD-10-CM | POA: Diagnosis not present

## 2016-12-22 DIAGNOSIS — Z794 Long term (current) use of insulin: Secondary | ICD-10-CM | POA: Diagnosis not present

## 2016-12-22 DIAGNOSIS — Z8679 Personal history of other diseases of the circulatory system: Secondary | ICD-10-CM | POA: Diagnosis not present

## 2016-12-22 DIAGNOSIS — I5032 Chronic diastolic (congestive) heart failure: Secondary | ICD-10-CM | POA: Diagnosis not present

## 2016-12-22 DIAGNOSIS — L97529 Non-pressure chronic ulcer of other part of left foot with unspecified severity: Secondary | ICD-10-CM | POA: Diagnosis not present

## 2016-12-22 DIAGNOSIS — D631 Anemia in chronic kidney disease: Secondary | ICD-10-CM | POA: Diagnosis not present

## 2016-12-22 DIAGNOSIS — Z950 Presence of cardiac pacemaker: Secondary | ICD-10-CM | POA: Diagnosis not present

## 2016-12-22 DIAGNOSIS — E1122 Type 2 diabetes mellitus with diabetic chronic kidney disease: Secondary | ICD-10-CM | POA: Diagnosis not present

## 2016-12-22 DIAGNOSIS — Z951 Presence of aortocoronary bypass graft: Secondary | ICD-10-CM | POA: Diagnosis not present

## 2016-12-22 DIAGNOSIS — I130 Hypertensive heart and chronic kidney disease with heart failure and stage 1 through stage 4 chronic kidney disease, or unspecified chronic kidney disease: Secondary | ICD-10-CM | POA: Diagnosis not present

## 2016-12-22 DIAGNOSIS — N183 Chronic kidney disease, stage 3 (moderate): Secondary | ICD-10-CM | POA: Diagnosis not present

## 2016-12-22 DIAGNOSIS — M109 Gout, unspecified: Secondary | ICD-10-CM | POA: Diagnosis not present

## 2016-12-22 DIAGNOSIS — I2510 Atherosclerotic heart disease of native coronary artery without angina pectoris: Secondary | ICD-10-CM | POA: Diagnosis not present

## 2016-12-23 DIAGNOSIS — I1310 Hypertensive heart and chronic kidney disease without heart failure, with stage 1 through stage 4 chronic kidney disease, or unspecified chronic kidney disease: Secondary | ICD-10-CM | POA: Diagnosis not present

## 2016-12-26 DIAGNOSIS — L97529 Non-pressure chronic ulcer of other part of left foot with unspecified severity: Secondary | ICD-10-CM | POA: Diagnosis not present

## 2016-12-26 DIAGNOSIS — I2510 Atherosclerotic heart disease of native coronary artery without angina pectoris: Secondary | ICD-10-CM | POA: Diagnosis not present

## 2016-12-26 DIAGNOSIS — Z794 Long term (current) use of insulin: Secondary | ICD-10-CM | POA: Diagnosis not present

## 2016-12-26 DIAGNOSIS — I130 Hypertensive heart and chronic kidney disease with heart failure and stage 1 through stage 4 chronic kidney disease, or unspecified chronic kidney disease: Secondary | ICD-10-CM | POA: Diagnosis not present

## 2016-12-26 DIAGNOSIS — D631 Anemia in chronic kidney disease: Secondary | ICD-10-CM | POA: Diagnosis not present

## 2016-12-26 DIAGNOSIS — E11621 Type 2 diabetes mellitus with foot ulcer: Secondary | ICD-10-CM | POA: Diagnosis not present

## 2016-12-26 DIAGNOSIS — N179 Acute kidney failure, unspecified: Secondary | ICD-10-CM | POA: Diagnosis not present

## 2016-12-26 DIAGNOSIS — I5032 Chronic diastolic (congestive) heart failure: Secondary | ICD-10-CM | POA: Diagnosis not present

## 2016-12-26 DIAGNOSIS — Z8679 Personal history of other diseases of the circulatory system: Secondary | ICD-10-CM | POA: Diagnosis not present

## 2016-12-26 DIAGNOSIS — N183 Chronic kidney disease, stage 3 (moderate): Secondary | ICD-10-CM | POA: Diagnosis not present

## 2016-12-26 DIAGNOSIS — Z951 Presence of aortocoronary bypass graft: Secondary | ICD-10-CM | POA: Diagnosis not present

## 2016-12-26 DIAGNOSIS — E1122 Type 2 diabetes mellitus with diabetic chronic kidney disease: Secondary | ICD-10-CM | POA: Diagnosis not present

## 2016-12-26 DIAGNOSIS — M109 Gout, unspecified: Secondary | ICD-10-CM | POA: Diagnosis not present

## 2016-12-26 DIAGNOSIS — Z950 Presence of cardiac pacemaker: Secondary | ICD-10-CM | POA: Diagnosis not present

## 2016-12-28 DIAGNOSIS — I5032 Chronic diastolic (congestive) heart failure: Secondary | ICD-10-CM | POA: Diagnosis not present

## 2016-12-28 DIAGNOSIS — I130 Hypertensive heart and chronic kidney disease with heart failure and stage 1 through stage 4 chronic kidney disease, or unspecified chronic kidney disease: Secondary | ICD-10-CM | POA: Diagnosis not present

## 2016-12-28 DIAGNOSIS — E1122 Type 2 diabetes mellitus with diabetic chronic kidney disease: Secondary | ICD-10-CM | POA: Diagnosis not present

## 2016-12-28 DIAGNOSIS — M109 Gout, unspecified: Secondary | ICD-10-CM | POA: Diagnosis not present

## 2016-12-28 DIAGNOSIS — Z951 Presence of aortocoronary bypass graft: Secondary | ICD-10-CM | POA: Diagnosis not present

## 2016-12-28 DIAGNOSIS — D631 Anemia in chronic kidney disease: Secondary | ICD-10-CM | POA: Diagnosis not present

## 2016-12-28 DIAGNOSIS — Z950 Presence of cardiac pacemaker: Secondary | ICD-10-CM | POA: Diagnosis not present

## 2016-12-28 DIAGNOSIS — Z8679 Personal history of other diseases of the circulatory system: Secondary | ICD-10-CM | POA: Diagnosis not present

## 2016-12-28 DIAGNOSIS — E11621 Type 2 diabetes mellitus with foot ulcer: Secondary | ICD-10-CM | POA: Diagnosis not present

## 2016-12-28 DIAGNOSIS — N179 Acute kidney failure, unspecified: Secondary | ICD-10-CM | POA: Diagnosis not present

## 2016-12-28 DIAGNOSIS — N183 Chronic kidney disease, stage 3 (moderate): Secondary | ICD-10-CM | POA: Diagnosis not present

## 2016-12-28 DIAGNOSIS — L97529 Non-pressure chronic ulcer of other part of left foot with unspecified severity: Secondary | ICD-10-CM | POA: Diagnosis not present

## 2016-12-28 DIAGNOSIS — Z794 Long term (current) use of insulin: Secondary | ICD-10-CM | POA: Diagnosis not present

## 2016-12-28 DIAGNOSIS — I2510 Atherosclerotic heart disease of native coronary artery without angina pectoris: Secondary | ICD-10-CM | POA: Diagnosis not present

## 2016-12-30 DIAGNOSIS — Z23 Encounter for immunization: Secondary | ICD-10-CM | POA: Diagnosis not present

## 2016-12-30 DIAGNOSIS — I1310 Hypertensive heart and chronic kidney disease without heart failure, with stage 1 through stage 4 chronic kidney disease, or unspecified chronic kidney disease: Secondary | ICD-10-CM | POA: Diagnosis not present

## 2016-12-30 DIAGNOSIS — E119 Type 2 diabetes mellitus without complications: Secondary | ICD-10-CM | POA: Diagnosis not present

## 2016-12-30 DIAGNOSIS — I130 Hypertensive heart and chronic kidney disease with heart failure and stage 1 through stage 4 chronic kidney disease, or unspecified chronic kidney disease: Secondary | ICD-10-CM | POA: Diagnosis not present

## 2016-12-30 DIAGNOSIS — I110 Hypertensive heart disease with heart failure: Secondary | ICD-10-CM | POA: Diagnosis not present

## 2017-01-02 DIAGNOSIS — D631 Anemia in chronic kidney disease: Secondary | ICD-10-CM | POA: Diagnosis not present

## 2017-01-02 DIAGNOSIS — I2510 Atherosclerotic heart disease of native coronary artery without angina pectoris: Secondary | ICD-10-CM | POA: Diagnosis not present

## 2017-01-02 DIAGNOSIS — N183 Chronic kidney disease, stage 3 (moderate): Secondary | ICD-10-CM | POA: Diagnosis not present

## 2017-01-02 DIAGNOSIS — L97529 Non-pressure chronic ulcer of other part of left foot with unspecified severity: Secondary | ICD-10-CM | POA: Diagnosis not present

## 2017-01-02 DIAGNOSIS — Z951 Presence of aortocoronary bypass graft: Secondary | ICD-10-CM | POA: Diagnosis not present

## 2017-01-02 DIAGNOSIS — Z8679 Personal history of other diseases of the circulatory system: Secondary | ICD-10-CM | POA: Diagnosis not present

## 2017-01-02 DIAGNOSIS — I5032 Chronic diastolic (congestive) heart failure: Secondary | ICD-10-CM | POA: Diagnosis not present

## 2017-01-02 DIAGNOSIS — I130 Hypertensive heart and chronic kidney disease with heart failure and stage 1 through stage 4 chronic kidney disease, or unspecified chronic kidney disease: Secondary | ICD-10-CM | POA: Diagnosis not present

## 2017-01-02 DIAGNOSIS — Z950 Presence of cardiac pacemaker: Secondary | ICD-10-CM | POA: Diagnosis not present

## 2017-01-02 DIAGNOSIS — M109 Gout, unspecified: Secondary | ICD-10-CM | POA: Diagnosis not present

## 2017-01-02 DIAGNOSIS — Z794 Long term (current) use of insulin: Secondary | ICD-10-CM | POA: Diagnosis not present

## 2017-01-02 DIAGNOSIS — E11621 Type 2 diabetes mellitus with foot ulcer: Secondary | ICD-10-CM | POA: Diagnosis not present

## 2017-01-02 DIAGNOSIS — E1122 Type 2 diabetes mellitus with diabetic chronic kidney disease: Secondary | ICD-10-CM | POA: Diagnosis not present

## 2017-01-02 DIAGNOSIS — N179 Acute kidney failure, unspecified: Secondary | ICD-10-CM | POA: Diagnosis not present

## 2017-01-04 DIAGNOSIS — I130 Hypertensive heart and chronic kidney disease with heart failure and stage 1 through stage 4 chronic kidney disease, or unspecified chronic kidney disease: Secondary | ICD-10-CM | POA: Diagnosis not present

## 2017-01-04 DIAGNOSIS — N183 Chronic kidney disease, stage 3 (moderate): Secondary | ICD-10-CM | POA: Diagnosis not present

## 2017-01-04 DIAGNOSIS — L97529 Non-pressure chronic ulcer of other part of left foot with unspecified severity: Secondary | ICD-10-CM | POA: Diagnosis not present

## 2017-01-04 DIAGNOSIS — Z8679 Personal history of other diseases of the circulatory system: Secondary | ICD-10-CM | POA: Diagnosis not present

## 2017-01-04 DIAGNOSIS — M109 Gout, unspecified: Secondary | ICD-10-CM | POA: Diagnosis not present

## 2017-01-04 DIAGNOSIS — Z794 Long term (current) use of insulin: Secondary | ICD-10-CM | POA: Diagnosis not present

## 2017-01-04 DIAGNOSIS — N179 Acute kidney failure, unspecified: Secondary | ICD-10-CM | POA: Diagnosis not present

## 2017-01-04 DIAGNOSIS — Z950 Presence of cardiac pacemaker: Secondary | ICD-10-CM | POA: Diagnosis not present

## 2017-01-04 DIAGNOSIS — E11621 Type 2 diabetes mellitus with foot ulcer: Secondary | ICD-10-CM | POA: Diagnosis not present

## 2017-01-04 DIAGNOSIS — Z951 Presence of aortocoronary bypass graft: Secondary | ICD-10-CM | POA: Diagnosis not present

## 2017-01-04 DIAGNOSIS — E1122 Type 2 diabetes mellitus with diabetic chronic kidney disease: Secondary | ICD-10-CM | POA: Diagnosis not present

## 2017-01-04 DIAGNOSIS — D631 Anemia in chronic kidney disease: Secondary | ICD-10-CM | POA: Diagnosis not present

## 2017-01-04 DIAGNOSIS — I2510 Atherosclerotic heart disease of native coronary artery without angina pectoris: Secondary | ICD-10-CM | POA: Diagnosis not present

## 2017-01-04 DIAGNOSIS — I5032 Chronic diastolic (congestive) heart failure: Secondary | ICD-10-CM | POA: Diagnosis not present

## 2017-01-09 ENCOUNTER — Other Ambulatory Visit (HOSPITAL_COMMUNITY): Payer: Self-pay | Admitting: Family Medicine

## 2017-01-09 DIAGNOSIS — I6523 Occlusion and stenosis of bilateral carotid arteries: Secondary | ICD-10-CM

## 2017-01-10 DIAGNOSIS — E119 Type 2 diabetes mellitus without complications: Secondary | ICD-10-CM | POA: Diagnosis not present

## 2017-01-10 DIAGNOSIS — M130 Polyarthritis, unspecified: Secondary | ICD-10-CM | POA: Diagnosis not present

## 2017-01-10 DIAGNOSIS — I130 Hypertensive heart and chronic kidney disease with heart failure and stage 1 through stage 4 chronic kidney disease, or unspecified chronic kidney disease: Secondary | ICD-10-CM | POA: Diagnosis not present

## 2017-01-10 DIAGNOSIS — I110 Hypertensive heart disease with heart failure: Secondary | ICD-10-CM | POA: Diagnosis not present

## 2017-01-11 DIAGNOSIS — I2510 Atherosclerotic heart disease of native coronary artery without angina pectoris: Secondary | ICD-10-CM | POA: Diagnosis not present

## 2017-01-11 DIAGNOSIS — D631 Anemia in chronic kidney disease: Secondary | ICD-10-CM | POA: Diagnosis not present

## 2017-01-11 DIAGNOSIS — N179 Acute kidney failure, unspecified: Secondary | ICD-10-CM | POA: Diagnosis not present

## 2017-01-11 DIAGNOSIS — I130 Hypertensive heart and chronic kidney disease with heart failure and stage 1 through stage 4 chronic kidney disease, or unspecified chronic kidney disease: Secondary | ICD-10-CM | POA: Diagnosis not present

## 2017-01-11 DIAGNOSIS — L97529 Non-pressure chronic ulcer of other part of left foot with unspecified severity: Secondary | ICD-10-CM | POA: Diagnosis not present

## 2017-01-11 DIAGNOSIS — N183 Chronic kidney disease, stage 3 (moderate): Secondary | ICD-10-CM | POA: Diagnosis not present

## 2017-01-11 DIAGNOSIS — Z950 Presence of cardiac pacemaker: Secondary | ICD-10-CM | POA: Diagnosis not present

## 2017-01-11 DIAGNOSIS — I5032 Chronic diastolic (congestive) heart failure: Secondary | ICD-10-CM | POA: Diagnosis not present

## 2017-01-11 DIAGNOSIS — E11621 Type 2 diabetes mellitus with foot ulcer: Secondary | ICD-10-CM | POA: Diagnosis not present

## 2017-01-11 DIAGNOSIS — Z8679 Personal history of other diseases of the circulatory system: Secondary | ICD-10-CM | POA: Diagnosis not present

## 2017-01-11 DIAGNOSIS — M109 Gout, unspecified: Secondary | ICD-10-CM | POA: Diagnosis not present

## 2017-01-11 DIAGNOSIS — E1122 Type 2 diabetes mellitus with diabetic chronic kidney disease: Secondary | ICD-10-CM | POA: Diagnosis not present

## 2017-01-11 DIAGNOSIS — Z951 Presence of aortocoronary bypass graft: Secondary | ICD-10-CM | POA: Diagnosis not present

## 2017-01-11 DIAGNOSIS — Z794 Long term (current) use of insulin: Secondary | ICD-10-CM | POA: Diagnosis not present

## 2017-01-12 ENCOUNTER — Ambulatory Visit (HOSPITAL_COMMUNITY)
Admission: RE | Admit: 2017-01-12 | Discharge: 2017-01-12 | Disposition: A | Discharge status: Home / Self Care | Payer: Medicare Other | Source: Ambulatory Visit | Attending: Cardiology | Admitting: Cardiology

## 2017-01-12 DIAGNOSIS — I10 Essential (primary) hypertension: Secondary | ICD-10-CM | POA: Diagnosis not present

## 2017-01-12 DIAGNOSIS — I2510 Atherosclerotic heart disease of native coronary artery without angina pectoris: Secondary | ICD-10-CM | POA: Insufficient documentation

## 2017-01-12 DIAGNOSIS — I6523 Occlusion and stenosis of bilateral carotid arteries: Secondary | ICD-10-CM | POA: Diagnosis not present

## 2017-01-12 DIAGNOSIS — Z87891 Personal history of nicotine dependence: Secondary | ICD-10-CM | POA: Insufficient documentation

## 2017-01-12 DIAGNOSIS — E785 Hyperlipidemia, unspecified: Secondary | ICD-10-CM | POA: Insufficient documentation

## 2017-01-17 DIAGNOSIS — H35371 Puckering of macula, right eye: Secondary | ICD-10-CM | POA: Diagnosis not present

## 2017-01-17 DIAGNOSIS — E113293 Type 2 diabetes mellitus with mild nonproliferative diabetic retinopathy without macular edema, bilateral: Secondary | ICD-10-CM | POA: Diagnosis not present

## 2017-01-17 DIAGNOSIS — H348312 Tributary (branch) retinal vein occlusion, right eye, stable: Secondary | ICD-10-CM | POA: Diagnosis not present

## 2017-01-17 DIAGNOSIS — H35052 Retinal neovascularization, unspecified, left eye: Secondary | ICD-10-CM | POA: Diagnosis not present

## 2017-01-18 DIAGNOSIS — I2510 Atherosclerotic heart disease of native coronary artery without angina pectoris: Secondary | ICD-10-CM | POA: Diagnosis not present

## 2017-01-18 DIAGNOSIS — E11621 Type 2 diabetes mellitus with foot ulcer: Secondary | ICD-10-CM | POA: Diagnosis not present

## 2017-01-18 DIAGNOSIS — Z950 Presence of cardiac pacemaker: Secondary | ICD-10-CM | POA: Diagnosis not present

## 2017-01-18 DIAGNOSIS — M109 Gout, unspecified: Secondary | ICD-10-CM | POA: Diagnosis not present

## 2017-01-18 DIAGNOSIS — I5032 Chronic diastolic (congestive) heart failure: Secondary | ICD-10-CM | POA: Diagnosis not present

## 2017-01-18 DIAGNOSIS — Z8679 Personal history of other diseases of the circulatory system: Secondary | ICD-10-CM | POA: Diagnosis not present

## 2017-01-18 DIAGNOSIS — D631 Anemia in chronic kidney disease: Secondary | ICD-10-CM | POA: Diagnosis not present

## 2017-01-18 DIAGNOSIS — L97529 Non-pressure chronic ulcer of other part of left foot with unspecified severity: Secondary | ICD-10-CM | POA: Diagnosis not present

## 2017-01-18 DIAGNOSIS — I130 Hypertensive heart and chronic kidney disease with heart failure and stage 1 through stage 4 chronic kidney disease, or unspecified chronic kidney disease: Secondary | ICD-10-CM | POA: Diagnosis not present

## 2017-01-18 DIAGNOSIS — N183 Chronic kidney disease, stage 3 (moderate): Secondary | ICD-10-CM | POA: Diagnosis not present

## 2017-01-18 DIAGNOSIS — Z794 Long term (current) use of insulin: Secondary | ICD-10-CM | POA: Diagnosis not present

## 2017-01-18 DIAGNOSIS — Z951 Presence of aortocoronary bypass graft: Secondary | ICD-10-CM | POA: Diagnosis not present

## 2017-01-18 DIAGNOSIS — N179 Acute kidney failure, unspecified: Secondary | ICD-10-CM | POA: Diagnosis not present

## 2017-01-18 DIAGNOSIS — E1122 Type 2 diabetes mellitus with diabetic chronic kidney disease: Secondary | ICD-10-CM | POA: Diagnosis not present

## 2017-01-25 DIAGNOSIS — M109 Gout, unspecified: Secondary | ICD-10-CM | POA: Diagnosis not present

## 2017-01-25 DIAGNOSIS — I2510 Atherosclerotic heart disease of native coronary artery without angina pectoris: Secondary | ICD-10-CM | POA: Diagnosis not present

## 2017-01-25 DIAGNOSIS — Z794 Long term (current) use of insulin: Secondary | ICD-10-CM | POA: Diagnosis not present

## 2017-01-25 DIAGNOSIS — Z950 Presence of cardiac pacemaker: Secondary | ICD-10-CM | POA: Diagnosis not present

## 2017-01-25 DIAGNOSIS — Z951 Presence of aortocoronary bypass graft: Secondary | ICD-10-CM | POA: Diagnosis not present

## 2017-01-25 DIAGNOSIS — Z8679 Personal history of other diseases of the circulatory system: Secondary | ICD-10-CM | POA: Diagnosis not present

## 2017-01-25 DIAGNOSIS — E1122 Type 2 diabetes mellitus with diabetic chronic kidney disease: Secondary | ICD-10-CM | POA: Diagnosis not present

## 2017-01-25 DIAGNOSIS — L97529 Non-pressure chronic ulcer of other part of left foot with unspecified severity: Secondary | ICD-10-CM | POA: Diagnosis not present

## 2017-01-25 DIAGNOSIS — I130 Hypertensive heart and chronic kidney disease with heart failure and stage 1 through stage 4 chronic kidney disease, or unspecified chronic kidney disease: Secondary | ICD-10-CM | POA: Diagnosis not present

## 2017-01-25 DIAGNOSIS — N179 Acute kidney failure, unspecified: Secondary | ICD-10-CM | POA: Diagnosis not present

## 2017-01-25 DIAGNOSIS — N183 Chronic kidney disease, stage 3 (moderate): Secondary | ICD-10-CM | POA: Diagnosis not present

## 2017-01-25 DIAGNOSIS — D631 Anemia in chronic kidney disease: Secondary | ICD-10-CM | POA: Diagnosis not present

## 2017-01-25 DIAGNOSIS — I5032 Chronic diastolic (congestive) heart failure: Secondary | ICD-10-CM | POA: Diagnosis not present

## 2017-01-25 DIAGNOSIS — E11621 Type 2 diabetes mellitus with foot ulcer: Secondary | ICD-10-CM | POA: Diagnosis not present

## 2017-02-01 ENCOUNTER — Ambulatory Visit (INDEPENDENT_AMBULATORY_CARE_PROVIDER_SITE_OTHER): Payer: Medicare Other | Admitting: Podiatry

## 2017-02-01 ENCOUNTER — Other Ambulatory Visit: Payer: Self-pay | Admitting: *Deleted

## 2017-02-01 DIAGNOSIS — Z8679 Personal history of other diseases of the circulatory system: Secondary | ICD-10-CM | POA: Diagnosis not present

## 2017-02-01 DIAGNOSIS — Z950 Presence of cardiac pacemaker: Secondary | ICD-10-CM | POA: Diagnosis not present

## 2017-02-01 DIAGNOSIS — L84 Corns and callosities: Secondary | ICD-10-CM

## 2017-02-01 DIAGNOSIS — M79676 Pain in unspecified toe(s): Secondary | ICD-10-CM | POA: Diagnosis not present

## 2017-02-01 DIAGNOSIS — D631 Anemia in chronic kidney disease: Secondary | ICD-10-CM | POA: Diagnosis not present

## 2017-02-01 DIAGNOSIS — I5032 Chronic diastolic (congestive) heart failure: Secondary | ICD-10-CM | POA: Diagnosis not present

## 2017-02-01 DIAGNOSIS — Z794 Long term (current) use of insulin: Secondary | ICD-10-CM | POA: Diagnosis not present

## 2017-02-01 DIAGNOSIS — B351 Tinea unguium: Secondary | ICD-10-CM | POA: Diagnosis not present

## 2017-02-01 DIAGNOSIS — E11621 Type 2 diabetes mellitus with foot ulcer: Secondary | ICD-10-CM | POA: Diagnosis not present

## 2017-02-01 DIAGNOSIS — L97529 Non-pressure chronic ulcer of other part of left foot with unspecified severity: Secondary | ICD-10-CM | POA: Diagnosis not present

## 2017-02-01 DIAGNOSIS — I6523 Occlusion and stenosis of bilateral carotid arteries: Secondary | ICD-10-CM

## 2017-02-01 DIAGNOSIS — M109 Gout, unspecified: Secondary | ICD-10-CM | POA: Diagnosis not present

## 2017-02-01 DIAGNOSIS — I130 Hypertensive heart and chronic kidney disease with heart failure and stage 1 through stage 4 chronic kidney disease, or unspecified chronic kidney disease: Secondary | ICD-10-CM | POA: Diagnosis not present

## 2017-02-01 DIAGNOSIS — E1122 Type 2 diabetes mellitus with diabetic chronic kidney disease: Secondary | ICD-10-CM | POA: Diagnosis not present

## 2017-02-01 DIAGNOSIS — M7752 Other enthesopathy of left foot: Secondary | ICD-10-CM

## 2017-02-01 DIAGNOSIS — N179 Acute kidney failure, unspecified: Secondary | ICD-10-CM | POA: Diagnosis not present

## 2017-02-01 DIAGNOSIS — I2510 Atherosclerotic heart disease of native coronary artery without angina pectoris: Secondary | ICD-10-CM | POA: Diagnosis not present

## 2017-02-01 DIAGNOSIS — N183 Chronic kidney disease, stage 3 (moderate): Secondary | ICD-10-CM | POA: Diagnosis not present

## 2017-02-01 DIAGNOSIS — Z951 Presence of aortocoronary bypass graft: Secondary | ICD-10-CM | POA: Diagnosis not present

## 2017-02-01 NOTE — Progress Notes (Signed)
This patient returns to the office stating that her corn on her second toe left foot has become increasingly painful and she is unable to touch the area.  She says she was seen here in August and she was treated with debridement of the skin lesion and received an injection in her toe.  She says the toe was temporarily improved but the pain has returned and she is now unable to touch the second toe due to the pain  . She says in her past history one of her doctors thought she had gout in the second toe  She also says she has thick painful nails that are painful walking and wearing her shoes.  She is unable to self treat.  She presents the office today for an evaluation and treatment of her nails and painful corn.  General Appearance  Alert, conversant and in no acute stress.  Vascular  Dorsalis pedis and posterior pulses are palpable  bilaterally.  Capillary return is within normal limits  Bilaterally. Temperature is within normal limits  Bilaterally  Neurologic  Senn-Weinstein monofilament wire test within normal limits  bilaterally. Muscle power  Within normal limits bilaterally.  Nails Thick disfigured discolored nails with subungual debride bilaterally from hallux to fifth toes bilaterally. No evidence of bacterial infection or drainage bilaterally.  Orthopedic  No limitations of motion of motion feet bilaterally.  No crepitus or effusions noted.  No bony pathology or digital deformities noted. Second toe is dislocated at the PIPJ.  Skin  normotropic skin with no porokeratosis noted bilaterally.  There is a buildup of white necrotic tissue noted at the medial aspect of the second digit left foot.  No redness, swelling or drainage noted at the site of the skin lesion.   Onychomycosis  B/L  Skin lesion second toe left foot.   ROV  Debridement of nails x 10.   Debridement of the corn on the second toe left foot reveals a healing ulcer at the level of the PIPJ second toe left.  This area was to  debrided . after local anesthetic of Xylocaine was used to anesthetize the second toe left.  The toe was then bandaged with Neosporin and dry sterile dressing.   I discussed the pathology with this patient and told her this corn redevelops due to the bone being prominent under the corn due to the dislocation.  She was instructed to check on her toe and bandaging it for the next week  . Padding was also dispensed to separate the first and second digit left foot.  She was told to return to the office in 10 weeks for further evaluation and treatment..Marland Kitchen

## 2017-02-08 ENCOUNTER — Ambulatory Visit (INDEPENDENT_AMBULATORY_CARE_PROVIDER_SITE_OTHER): Payer: Medicare Other | Admitting: *Deleted

## 2017-02-08 DIAGNOSIS — I495 Sick sinus syndrome: Secondary | ICD-10-CM

## 2017-02-09 DIAGNOSIS — I1310 Hypertensive heart and chronic kidney disease without heart failure, with stage 1 through stage 4 chronic kidney disease, or unspecified chronic kidney disease: Secondary | ICD-10-CM | POA: Diagnosis not present

## 2017-02-09 DIAGNOSIS — E039 Hypothyroidism, unspecified: Secondary | ICD-10-CM | POA: Diagnosis not present

## 2017-02-09 DIAGNOSIS — M109 Gout, unspecified: Secondary | ICD-10-CM | POA: Diagnosis not present

## 2017-02-09 NOTE — Progress Notes (Signed)
Remote pacemaker transmission.   

## 2017-02-10 ENCOUNTER — Encounter: Payer: Self-pay | Admitting: Cardiology

## 2017-02-10 LAB — CUP PACEART REMOTE DEVICE CHECK
Battery Impedance: 330 Ohm
Battery Voltage: 2.79 V
Brady Statistic AP VP Percent: 78 %
Date Time Interrogation Session: 20181101124840
Implantable Lead Location: 753859
Implantable Lead Model: 5076
Implantable Pulse Generator Implant Date: 20131126
Lead Channel Impedance Value: 445 Ohm
Lead Channel Pacing Threshold Amplitude: 0.5 V
Lead Channel Pacing Threshold Pulse Width: 0.4 ms
Lead Channel Setting Pacing Amplitude: 2.5 V
Lead Channel Setting Pacing Pulse Width: 0.4 ms
Lead Channel Setting Sensing Sensitivity: 2 mV
MDC IDC LEAD IMPLANT DT: 20131126
MDC IDC LEAD IMPLANT DT: 20131126
MDC IDC LEAD LOCATION: 753860
MDC IDC MSMT BATTERY REMAINING LONGEVITY: 90 mo
MDC IDC MSMT LEADCHNL RV IMPEDANCE VALUE: 465 Ohm
MDC IDC MSMT LEADCHNL RV PACING THRESHOLD AMPLITUDE: 1 V
MDC IDC MSMT LEADCHNL RV PACING THRESHOLD PULSEWIDTH: 0.4 ms
MDC IDC SET LEADCHNL RA PACING AMPLITUDE: 2 V
MDC IDC STAT BRADY AP VS PERCENT: 8 %
MDC IDC STAT BRADY AS VP PERCENT: 13 %
MDC IDC STAT BRADY AS VS PERCENT: 2 %

## 2017-03-15 DIAGNOSIS — I1310 Hypertensive heart and chronic kidney disease without heart failure, with stage 1 through stage 4 chronic kidney disease, or unspecified chronic kidney disease: Secondary | ICD-10-CM | POA: Diagnosis not present

## 2017-03-15 DIAGNOSIS — M1009 Idiopathic gout, multiple sites: Secondary | ICD-10-CM | POA: Diagnosis not present

## 2017-03-15 DIAGNOSIS — I10 Essential (primary) hypertension: Secondary | ICD-10-CM | POA: Diagnosis not present

## 2017-03-15 DIAGNOSIS — E039 Hypothyroidism, unspecified: Secondary | ICD-10-CM | POA: Diagnosis not present

## 2017-03-15 DIAGNOSIS — E119 Type 2 diabetes mellitus without complications: Secondary | ICD-10-CM | POA: Diagnosis not present

## 2017-03-28 DIAGNOSIS — E113293 Type 2 diabetes mellitus with mild nonproliferative diabetic retinopathy without macular edema, bilateral: Secondary | ICD-10-CM | POA: Diagnosis not present

## 2017-03-28 DIAGNOSIS — H353221 Exudative age-related macular degeneration, left eye, with active choroidal neovascularization: Secondary | ICD-10-CM | POA: Diagnosis not present

## 2017-03-28 DIAGNOSIS — H348312 Tributary (branch) retinal vein occlusion, right eye, stable: Secondary | ICD-10-CM | POA: Diagnosis not present

## 2017-03-28 DIAGNOSIS — H35371 Puckering of macula, right eye: Secondary | ICD-10-CM | POA: Diagnosis not present

## 2017-04-07 ENCOUNTER — Other Ambulatory Visit: Payer: Self-pay | Admitting: Cardiovascular Disease

## 2017-04-07 DIAGNOSIS — I5032 Chronic diastolic (congestive) heart failure: Secondary | ICD-10-CM

## 2017-04-12 ENCOUNTER — Ambulatory Visit: Payer: Medicare Other | Admitting: Podiatry

## 2017-04-12 ENCOUNTER — Encounter: Payer: Self-pay | Admitting: Podiatry

## 2017-04-12 DIAGNOSIS — L84 Corns and callosities: Secondary | ICD-10-CM

## 2017-04-12 DIAGNOSIS — M79676 Pain in unspecified toe(s): Secondary | ICD-10-CM

## 2017-04-12 DIAGNOSIS — B351 Tinea unguium: Secondary | ICD-10-CM | POA: Diagnosis not present

## 2017-04-12 DIAGNOSIS — M7752 Other enthesopathy of left foot: Secondary | ICD-10-CM

## 2017-04-12 NOTE — Progress Notes (Signed)
This patient presents the office with continued pain noted on the second toe of her left foot.  She says this toe has become painful and throbs at night, which prevents her from sleep..  She has previously been treated with injection therapy including an anesthetic to allow debridement of the corn to be performed.  She says that the pain returned and she has been living in pain for months. She also says that she has pain and discomfort due to the thickness of her nails.  The nails are painful walking and wearing her shoes. She presents the office today for continued evaluation and treatment of her feet.   General Appearance  Alert, conversant and in no acute stress.  Vascular  Dorsalis pedis and posterior pulses are palpable  bilaterally.  Capillary return is within normal limits  bilaterally. Temperature is within normal limits  Bilaterally.  Neurologic  Senn-Weinstein monofilament wire test within normal limits  bilaterally. Muscle power within normal limits bilaterally.  Nails Thick disfigured discolored nails with subungual debris bilaterally from hallux to fifth toes bilaterally. No evidence of bacterial infection or drainage bilaterally.  Orthopedic  No limitations of motion of motion feet bilaterally.  No crepitus or effusions noted.  Dislocated and deformed second digit left foot.  Skin  normotropic skin with no porokeratosis noted bilaterally.  No signs of infections or ulcers noted.  Corn noted on the medial aspect second toe left foot.   Onychomycosis  B/L  Contracted second toe left foot.  Debridement and grinding of long thick painful nails x 10.  Debridement of corn second toe left foot.  Neosporin/DSD.  The site of the ulcer/corn reveals  a pinhole ulcer with no evidence of any infection or drainage.  After discussion and treatment for her nails and corn. It was decided to have her schedule an appointment with one of the  doctors since she says she is interested in possible amputation  of the second toe left foot. RTC 3 months for nail care.   Helane GuntherGregory Rhilee Currin DPM

## 2017-04-24 ENCOUNTER — Ambulatory Visit (INDEPENDENT_AMBULATORY_CARE_PROVIDER_SITE_OTHER): Payer: Medicare Other

## 2017-04-24 ENCOUNTER — Ambulatory Visit: Payer: Medicare Other | Admitting: Podiatry

## 2017-04-24 ENCOUNTER — Other Ambulatory Visit: Payer: Self-pay | Admitting: Podiatry

## 2017-04-24 ENCOUNTER — Encounter: Payer: Self-pay | Admitting: Podiatry

## 2017-04-24 DIAGNOSIS — I739 Peripheral vascular disease, unspecified: Secondary | ICD-10-CM

## 2017-04-24 DIAGNOSIS — R52 Pain, unspecified: Secondary | ICD-10-CM

## 2017-04-24 DIAGNOSIS — M7752 Other enthesopathy of left foot: Secondary | ICD-10-CM | POA: Diagnosis not present

## 2017-04-24 DIAGNOSIS — G8929 Other chronic pain: Secondary | ICD-10-CM

## 2017-04-24 DIAGNOSIS — M79675 Pain in left toe(s): Secondary | ICD-10-CM

## 2017-04-25 ENCOUNTER — Telehealth: Payer: Self-pay | Admitting: *Deleted

## 2017-04-25 DIAGNOSIS — H348312 Tributary (branch) retinal vein occlusion, right eye, stable: Secondary | ICD-10-CM | POA: Diagnosis not present

## 2017-04-25 DIAGNOSIS — H353111 Nonexudative age-related macular degeneration, right eye, early dry stage: Secondary | ICD-10-CM | POA: Diagnosis not present

## 2017-04-25 DIAGNOSIS — E113293 Type 2 diabetes mellitus with mild nonproliferative diabetic retinopathy without macular edema, bilateral: Secondary | ICD-10-CM | POA: Diagnosis not present

## 2017-04-25 DIAGNOSIS — H353221 Exudative age-related macular degeneration, left eye, with active choroidal neovascularization: Secondary | ICD-10-CM | POA: Diagnosis not present

## 2017-04-25 NOTE — Telephone Encounter (Signed)
-----   Message from Vivi BarrackMatthew R Wagoner, DPM sent at 04/25/2017 12:46 PM EST ----- done ----- Message ----- From: Marissa Nestle'Connell, Jeani Fassnacht D, RN Sent: 04/24/2017   1:28 PM To: Vivi BarrackMatthew R Wagoner, DPM  Dr. Ardelle AntonWagoner, I need clinicals to fax for referral. Joya SanValery

## 2017-04-25 NOTE — Progress Notes (Signed)
Subjective: [name] presents the office today for surgical consultation at request of Dr. Prudence Davidson for concerns of chronic pain to the left second toe.  She states this is been ongoing for several years and she actually had been seen by Dr. Blenda Mounts for this several years ago.  We discussed amputation of the toe but she did not want to proceed with time however the toe is becoming more more painful and should discuss options.  The only thing that she has had so far is a Band-Aid around the toe to help pad it.  She denies ever having so on the toe and she denies any redness or drainage or any swelling.  No recent injury or trauma. Denies any systemic complaints such as fevers, chills, nausea, vomiting. No acute changes since last appointment, and no other complaints at this time.   Past Medical History:  Diagnosis Date  . Allergic rhinitis   . Anemia   . Anxiety   . Barrett esophagus   . CAD (coronary artery disease) 2009   a. Multivessel s/p PCI w/DES 2009 // b. s/p CABG 2011  //  c. LHC 8/15: pLAD 95 ISR, LCx 100, pOM1 40, dRCA 100, S-OM1/OM2 ok, S-D1 ok, S-PDA ok, L-LAD ok, EF 60%  . Carotid artery disease ([place])    a. Carotid US 8/34: RICA 1-96%; LICA 22-29% >> FU 1 year  //  b. Carotid US 9/17: R 1-39%, L 40-59% >> FU 1 year  . Chronic diastolic heart failure ()   . CKD (chronic kidney disease), stage II    GFR 60-89 ml/min  . Depression   . Disc disease, degenerative, cervical   . Diverticulosis   . Gastroparesis   . GERD (gastroesophageal reflux disease)   . Gout   . H/O hiatal hernia   . Helicobacter pylori gastritis   . History of echocardiogram    a. Echo 11/13: EF 55% to 60%. Grade 2 diastolic dysfunction, MAC, trivial MR, mild LAE, normal RVSF, mild RAE, PASP 39 mmHg  //  b. Echo 4/17: EF 55-60%, normal wall motion, trivial AI, MAC, moderate LAE, mild RVE, PASP 35 mmHg  . History of thrombocytopenia   . HTN (hypertension)   . Hyperlipidemia   . Hypothyroidism   . LBP (low back  pain)    Lumbar disc disease/lumbar spinal stenosis  . Morbid obesity ([place])   . Myocardial infarction ([place])   . Osteoarthritis   . Osteopenia   . PVD (peripheral vascular disease) ([place])   . Sick sinus syndrome [facility])    MDT Dual-chamber PPM implant 02/2012  . Type II or unspecified type diabetes mellitus without mention of complication, not stated as uncontrolled     Past Surgical History:  Procedure Laterality Date  . ABDOMINAL HYSTERECTOMY    . CARDIAC CATHETERIZATION     2011  DR COOPER (APPT NEXT WEEK)  . CHOLECYSTECTOMY    . CORONARY ARTERY BYPASS GRAFT  2011   LIMA-LAD, SVG-DIAG, SVG-OM1-OM2, SVG-PDA  . CORONARY STENT PLACEMENT     Drug-eluting stent to the left anterior descending, circumflex and right coronary artery in Jan 2009  . EYE SURGERY     BIL CATARACT REMOVAL 06/2010  . LEFT HEART CATHETERIZATION WITH CORONARY ANGIOGRAM N/A 12/02/2013   Procedure: LEFT HEART CATHETERIZATION WITH CORONARY ANGIOGRAM;  Surgeon: Sinclair Grooms, MD;  Location: [facility] C[facility] LAB;  Service: Cardiovascular;  Laterality: N/A;  . OVARIAN CYST REMOVAL    . PACEMAKER INSERTION  03/06/12  MDT Adapta L implanted by Dr Rayann Heman for SSS  . PERMANENT PACEMAKER INSERTION N/A 03/06/2012   Procedure: PERMANENT PACEMAKER INSERTION;  Surgeon: Thompson Grayer, MD;  Location: [facility] CATH LAB;  Service: Cardiovascular;  Laterality: N/A;  . SHOULDER ARTHROSCOPY  06/16/2011   Procedure: ARTHROSCOPY SHOULDER;  Surgeon: Sharmon Revere, MD;  Location: [place];  Service: Orthopedics;  Laterality: Left;  LEFT SHOULDER ARTHROSCOPY ACROMIALPLASTY, POSSIBLE MINI OPEN CUFF REPAIR   . TUBAL LIGATION       Current Outpatient Medications:  .  allopurinol (ZYLOPRIM) 100 MG tablet, Take 100 mg by mouth daily., Disp: , Rfl:  .  aspirin 81 MG tablet, Take 81 mg by mouth daily., Disp: , Rfl:  .  atorvastatin (LIPITOR) 20 MG tablet, TAKE 1 TABLET BY MOUTH  DAILY, Disp: 90 tablet, Rfl: 1 .  azelastine (OPTIVAR) 0.05 % ophthalmic  solution, Place 1 drop into both eyes 2 (two) times daily., Disp: , Rfl:  .  carvedilol (COREG) 3.125 MG tablet, TAKE 1 TABLET BY MOUTH TWO  TIMES DAILY, Disp: 180 tablet, Rfl: 3 .  clopidogrel (PLAVIX) 75 MG tablet, TAKE 1 TABLET BY MOUTH  DAILY, Disp: 30 tablet, Rfl: 11 .  colchicine 0.6 MG tablet, Take 0.6 mg by mouth daily as needed (gout). , Disp: , Rfl:  .  HUMULIN N 100 UNIT/ML injection, Inject 10-45 Units into the skin 2 (two) times daily before a meal. 45 units in the morning and 10 units at bedtime, Disp: , Rfl:  .  levothyroxine (SYNTHROID, LEVOTHROID) 100 MCG tablet, Take 1 tablet (100 mcg total) by mouth daily., Disp: 30 tablet, Rfl: 0 .  NITROSTAT 0.4 MG SL tablet, DISSOLVE ONE TABLET UNDER THE TONGUE EVERY 5 MINUTES AS NEEDED FOR CHEST PAIN.  DO NOT EXCEED A TOTAL OF 3 DOSES IN 15 MINUTES, Disp: 25 tablet, Rfl: 3 .  ONE TOUCH ULTRA TEST test strip, 1 each by Other route daily as needed (blood sugar). , Disp: , Rfl:  .  pioglitazone (ACTOS) 15 MG tablet, Take 15 mg by mouth daily. , Disp: , Rfl:   Current Facility-Administered Medications:  .  betamethasone acetate-betamethasone sodium phosphate (CELESTONE) injection 3 mg, 3 mg, Intramuscular, Once, Edrick Kins, DPM  Allergies  Allergen Reactions  . Ciprofloxacin Nausea And Vomiting    syncope  . Codeine Other (See Comments)    HALLUCINATIONS  . Hydrocodone Other (See Comments)    Makes her pass out  . Penicillins Hives  . Shellfish Allergy Hives  . Diltiazem Hcl Other (See Comments)    : low heart rate  . Sulfonamide Derivatives Nausea And Vomiting  . Lovastatin Other (See Comments)    Pt doesn't remember a reaction  . Metformin Other (See Comments)    diarrhea    Social History   Socioeconomic History  . Marital status: Widowed    Spouse name: Not on file  . Number of children: Not on file  . Years of education: Not on file  . Highest education level: Not on file  Social Needs  . Financial resource strain:  Not on file  . Food insecurity - worry: Not on file  . Food insecurity - inability: Not on file  . Transportation needs - medical: Not on file  . Transportation needs - non-medical: Not on file  Occupational History  . Occupation: RETIRED LPN  Tobacco Use  . Smoking status: Former Research scientist (life sciences)  . Smokeless tobacco: Never Used  . Tobacco comment: quit 30 yrs ago  Substance and Sexual Activity  . Alcohol use: No  . Drug use: No  . Sexual activity: Not on file  Other Topics Concern  . Not on file  Social History Narrative   Widowed 2004.., Lives alone..Family history is negative for premature coronary artery disease. Mother died at age 22 with heart disease in her later years, father died at age 19 from a stroke.Marland KitchenShe  has 8 siblings, none of whom have coronary artery disease.           Objective: AAO x3, NAD DP/PT pulses palpable however decreased bilaterally, CRT less than 3 seconds There is tenderness of the entire left second digit and there is lateral perforation of the digit.  There is hallux abductus present in the second digit is also taken in shape with a hallux.  There is trace edema to the second toe but there is no erythema or increase in warmth.  There is no open sores identified. No open lesions or pre-ulcerative lesions.  No pain with calf compression, swelling, warmth, erythema  Assessment: Chronic pain left second digit  Plan: -All treatment options discussed with the patient including all alternatives, risks, complications.  -X-ray was obtained and reviewed.  There is changes of the DIPJ.  There is no clinical signs of infection noted and this is likely chronic degenerative changes. -I did an ABI in the office today on the pilot study which revealed "PAD".  Because of this I did order an arterial duplex as well as for the consultation.  Also because of this morning hold off on any surgical intervention at this time until this is evaluated. -She is also had no other  conservative treatment she states except for Band-Aids.  I dispensed.  Offloading pads to see if this will help take pressure off the toe as well.  Discussed the change in shoes.  She would like to start with this before pursuing surgery. -Follow-up after arterial studies or sooner if needed.  Call any questions or concerns meantime and she agrees this plan has no further questions.  Trula Slade DPM

## 2017-04-25 NOTE — Telephone Encounter (Signed)
Fax[facility], [facility] and [facility]

## 2017-04-25 NOTE — Telephone Encounter (Signed)
Faxed referral and orders yesterday without 04/24/2017 [facility] Faxed 04/24/2017 clinicals to [facility].

## 2017-05-01 ENCOUNTER — Ambulatory Visit (HOSPITAL_COMMUNITY): Admission: RE | Admit: 2017-05-01 | Payer: Medicare Other | Source: Ambulatory Visit

## 2017-05-01 DIAGNOSIS — M8589 Other specified disorders of bone density and structure, multiple sites: Secondary | ICD-10-CM | POA: Diagnosis not present

## 2017-05-04 ENCOUNTER — Ambulatory Visit (HOSPITAL_COMMUNITY)
Admission: RE | Admit: 2017-05-04 | Discharge: 2017-05-04 | Disposition: A | Discharge status: Home / Self Care | Payer: Medicare Other | Source: Ambulatory Visit | Attending: Cardiovascular Disease | Admitting: Cardiovascular Disease

## 2017-05-04 DIAGNOSIS — R52 Pain, unspecified: Secondary | ICD-10-CM | POA: Diagnosis not present

## 2017-05-04 DIAGNOSIS — M7752 Other enthesopathy of left foot: Secondary | ICD-10-CM

## 2017-05-04 DIAGNOSIS — I739 Peripheral vascular disease, unspecified: Secondary | ICD-10-CM

## 2017-05-09 ENCOUNTER — Ambulatory Visit: Payer: Medicare Other | Admitting: Cardiovascular Disease

## 2017-05-10 ENCOUNTER — Encounter: Payer: Medicare Other | Admitting: *Deleted

## 2017-05-12 ENCOUNTER — Encounter: Payer: Self-pay | Admitting: Cardiology

## 2017-05-16 DIAGNOSIS — I10 Essential (primary) hypertension: Secondary | ICD-10-CM | POA: Diagnosis not present

## 2017-05-16 DIAGNOSIS — E039 Hypothyroidism, unspecified: Secondary | ICD-10-CM | POA: Diagnosis not present

## 2017-05-16 DIAGNOSIS — I1310 Hypertensive heart and chronic kidney disease without heart failure, with stage 1 through stage 4 chronic kidney disease, or unspecified chronic kidney disease: Secondary | ICD-10-CM | POA: Diagnosis not present

## 2017-05-16 DIAGNOSIS — E119 Type 2 diabetes mellitus without complications: Secondary | ICD-10-CM | POA: Diagnosis not present

## 2017-05-16 DIAGNOSIS — M109 Gout, unspecified: Secondary | ICD-10-CM | POA: Diagnosis not present

## 2017-05-16 DIAGNOSIS — E785 Hyperlipidemia, unspecified: Secondary | ICD-10-CM | POA: Diagnosis not present

## 2017-05-16 DIAGNOSIS — E118 Type 2 diabetes mellitus with unspecified complications: Secondary | ICD-10-CM | POA: Diagnosis not present

## 2017-05-19 DIAGNOSIS — I110 Hypertensive heart disease with heart failure: Secondary | ICD-10-CM | POA: Diagnosis not present

## 2017-05-19 DIAGNOSIS — I130 Hypertensive heart and chronic kidney disease with heart failure and stage 1 through stage 4 chronic kidney disease, or unspecified chronic kidney disease: Secondary | ICD-10-CM | POA: Diagnosis not present

## 2017-05-23 ENCOUNTER — Ambulatory Visit: Payer: Medicare Other | Admitting: Cardiovascular Disease

## 2017-05-23 VITALS — BP 168/70 | HR 61 | Ht 67.0 in | Wt 225.6 lb

## 2017-05-23 DIAGNOSIS — E785 Hyperlipidemia, unspecified: Secondary | ICD-10-CM

## 2017-05-23 DIAGNOSIS — I739 Peripheral vascular disease, unspecified: Secondary | ICD-10-CM

## 2017-05-23 DIAGNOSIS — I2510 Atherosclerotic heart disease of native coronary artery without angina pectoris: Secondary | ICD-10-CM | POA: Diagnosis not present

## 2017-05-23 NOTE — Progress Notes (Signed)
Cardiology Office Note   Date:  05/23/2017   ID:  [name], DOB Dec 17, 1932, MRN [id number]  PCP:  Lucianne Lei, MD  Cardiologist:  Dr. Burt Knack   No chief complaint on file.      History of Present Illness: [name] is a 84 y.o. female who was referred by Dr. Jacqualyn Posey for evaluation of peripheral arterial disease. She has known history of coronary artery disease status post CABG in 2011, sick sinus syndrome status post pacemaker, chronic diastolic heart failure, hypertension, type 2 diabetes and  chronic kidney disease. The patient has a deformity involving left second toe with significant discomfort in that area.  This has worsened recently.  She has no significant calf claudication.  She underwent noninvasive vascular evaluation.  Her vessels were noncompressible and ABI could not be evaluated.  Duplex showed moderate SFA disease in 2 locations with two-vessel runoff below the knee via the anterior and posterior tibial arteries.  Toe pressure was only mildly decreased. The patient's mobility is limited and she walks slowly with a walker.   Past Medical History:  Diagnosis Date  . Allergic rhinitis   . Anemia   . Anxiety   . Barrett esophagus   . CAD (coronary artery disease) 2009   a. Multivessel s/p PCI w/DES 2009 // b. s/p CABG 2011  //  c. LHC 8/15: pLAD 95 ISR, LCx 100, pOM1 40, dRCA 100, S-OM1/OM2 ok, S-D1 ok, S-PDA ok, L-LAD ok, EF 60%  . Carotid artery disease ([place])    a. Carotid US 6/46: RICA 8-03%; LICA 21-22% >> FU 1 year  //  b. Carotid US 9/17: R 1-39%, L 40-59% >> FU 1 year  . Chronic diastolic heart failure ([place])   . CKD (chronic kidney disease), stage II    GFR 60-89 ml/min  . Depression   . Disc disease, degenerative, cervical   . Diverticulosis   . Gastroparesis   . GERD (gastroesophageal reflux disease)   . Gout   . H/O hiatal hernia   . Helicobacter pylori gastritis   . History of echocardiogram    a. Echo 11/13: EF 55% to 60%. Grade 2  diastolic dysfunction, MAC, trivial MR, mild LAE, normal RVSF, mild RAE, PASP 39 mmHg  //  b. Echo 4/17: EF 55-60%, normal wall motion, trivial AI, MAC, moderate LAE, mild RVE, PASP 35 mmHg  . History of thrombocytopenia   . HTN (hypertension)   . Hyperlipidemia   . Hypothyroidism   . LBP (low back pain)    Lumbar disc disease/lumbar spinal stenosis  . Morbid obesity ([place])   . Myocardial infarction ([place])   . Osteoarthritis   . Osteopenia   . PVD (peripheral vascular disease) ([place])   . Sick sinus syndrome [facility])    MDT Dual-chamber PPM implant 02/2012  . Type II or unspecified type diabetes mellitus without mention of complication, not stated as uncontrolled     Past Surgical History:  Procedure Laterality Date  . ABDOMINAL HYSTERECTOMY    . CARDIAC CATHETERIZATION     2011  DR COOPER (APPT NEXT WEEK)  . CHOLECYSTECTOMY    . CORONARY ARTERY BYPASS GRAFT  2011   LIMA-LAD, SVG-DIAG, SVG-OM1-OM2, SVG-PDA  . CORONARY STENT PLACEMENT     Drug-eluting stent to the left anterior descending, circumflex and right coronary artery in Jan 2009  . EYE SURGERY     BIL CATARACT REMOVAL 06/2010  . LEFT HEART CATHETERIZATION WITH CORONARY ANGIOGRAM N/A 12/02/2013  Procedure: LEFT HEART CATHETERIZATION WITH CORONARY ANGIOGRAM;  Surgeon: Sinclair Grooms, MD;  Location: [facility] CATH LAB;  Service: Cardiovascular;  Laterality: N/A;  . OVARIAN CYST REMOVAL    . PACEMAKER INSERTION  03/06/12   MDT Adapta L implanted by Dr Rayann Heman for SSS  . PERMANENT PACEMAKER INSERTION N/A 03/06/2012   Procedure: PERMANENT PACEMAKER INSERTION;  Surgeon: Thompson Grayer, MD;  Location: [facility] CATH LAB;  Service: Cardiovascular;  Laterality: N/A;  . SHOULDER ARTHROSCOPY  06/16/2011   Procedure: ARTHROSCOPY SHOULDER;  Surgeon: Sharmon Revere, MD;  Location: [place];  Service: Orthopedics;  Laterality: Left;  LEFT SHOULDER ARTHROSCOPY ACROMIALPLASTY, POSSIBLE MINI OPEN CUFF REPAIR   . TUBAL LIGATION       Current Outpatient  Medications  Medication Sig Dispense Refill  . allopurinol (ZYLOPRIM) 100 MG tablet Take 100 mg by mouth daily.    Marland Kitchen aspirin 81 MG tablet Take 81 mg by mouth daily.    Marland Kitchen atorvastatin (LIPITOR) 20 MG tablet TAKE 1 TABLET BY MOUTH  DAILY 90 tablet 1  . azelastine (OPTIVAR) 0.05 % ophthalmic solution Place 1 drop into both eyes 2 (two) times daily.    . carvedilol (COREG) 3.125 MG tablet TAKE 1 TABLET BY MOUTH TWO  TIMES DAILY 180 tablet 3  . clopidogrel (PLAVIX) 75 MG tablet TAKE 1 TABLET BY MOUTH  DAILY 30 tablet 11  . colchicine 0.6 MG tablet Take 0.6 mg by mouth daily as needed (gout).     Marland Kitchen diclofenac sodium (VOLTAREN) 1 % GEL Apply 2 g topically 4 (four) times daily.    Marland Kitchen ENTRESTO 97-103 MG Take 1 tablet by mouth 2 (two) times daily.    Marland Kitchen HUMULIN N 100 UNIT/ML injection Inject 10-45 Units into the skin 2 (two) times daily before a meal. 45 units in the morning and 10 units at bedtime    . levothyroxine (SYNTHROID, LEVOTHROID) 100 MCG tablet Take 1 tablet (100 mcg total) by mouth daily. 30 tablet 0  . NITROSTAT 0.4 MG SL tablet DISSOLVE ONE TABLET UNDER THE TONGUE EVERY 5 MINUTES AS NEEDED FOR CHEST PAIN.  DO NOT EXCEED A TOTAL OF 3 DOSES IN 15 MINUTES 25 tablet 3  . ONE TOUCH ULTRA TEST test strip 1 each by Other route daily as needed (blood sugar).     Marland Kitchen oxyCODONE-acetaminophen (PERCOCET/ROXICET) 5-325 MG tablet Take 1 tablet by mouth daily as needed.    . pioglitazone (ACTOS) 15 MG tablet Take 15 mg by mouth daily.      Current Facility-Administered Medications  Medication Dose Route Frequency Provider Last Rate Last Dose  . betamethasone acetate-betamethasone sodium phosphate (CELESTONE) injection 3 mg  3 mg Intramuscular Once Edrick Kins, DPM        Allergies:   Ciprofloxacin; Codeine; Hydrocodone; Penicillins; Shellfish allergy; Diltiazem hcl; Sulfonamide derivatives; Lovastatin; and Metformin    Social History:  The patient  reports that she has quit smoking. she has never used  smokeless tobacco. She reports that she does not drink alcohol or use drugs.   Family History:  The patient's family history includes Coronary artery disease in her other; Diabetes in her mother; Heart attack in her mother; Hypertension in her mother; Stroke in her father.    ROS:  Please see the history of present illness.   Otherwise, review of systems are positive for none.   All other systems are reviewed and negative.    PHYSICAL EXAM: VS:  BP (!) 168/70   Pulse 61  Ht _0  (1.702 m)   Wt 225 lb 9.6 oz (102.3 kg)   SpO2 99%   BMI 35.33 kg/m  , BMI Body mass index is 35.33 kg/m. GEN: Well nourished, well developed, in no acute distress  HEENT: normal  Neck: no JVD, carotid bruits, or masses Cardiac: RRR; no murmurs, rubs, or gallops, mild to moderate bilateral edema  Respiratory:  clear to auscultation bilaterally, normal work of breathing GI: soft, nontender, nondistended, + BS MS: no deformity or atrophy  Skin: warm and dry, no rash Neuro:  Strength and sensation are intact Psych: euthymic mood, full affect Distal pulses are not palpable  EKG:  EKG is not ordered today.   Recent Labs: 12/03/2016: ALT 14 12/04/2016: Hemoglobin 10.5; Platelets 167 12/05/2016: BUN 53; Creatinine, Ser 1.44; Potassium 3.7; Sodium 141    Lipid Panel    Component Value Date/Time   CHOL 149 05/11/2015 0749   TRIG 66 05/11/2015 0749   TRIG 192 (H) 04/20/2006 1246   HDL 97 05/11/2015 0749   CHOLHDL 1.5 05/11/2015 0749   VLDL 13 05/11/2015 0749   LDLCALC 39 05/11/2015 0749   LDLDIRECT 82.5 09/11/2009 0000      Wt Readings from Last 3 Encounters:  05/23/17 225 lb 9.6 oz (102.3 kg)  12/04/16 224 lb (101.6 kg)  11/09/16 229 lb 12.8 oz (104.2 kg)       No flowsheet data found.    ASSESSMENT AND PLAN:  1.  Peripheral arterial disease: The patient's mobility is very limited and she walks slowly with a walker.  No convincing symptoms of claudication which could be due to limited  functional capacity.  She describes localized discomfort at the left second toe which is clearly deformed.  Although she does have peripheral arterial disease, she has in-line flow to the toes via the anterior and posterior tibial arteries.  Toe pressure was only mildly abnormal.  Based on this, if she undergoes amputation of the left second toe, she should have a very good chance of healing. If for some reasons the amputation area does not heal completely, we can then consider proceeding with angiography and possible endovascular intervention to the SFA.  2.  Coronary artery disease involving native coronary arteries: Currently with no anginal symptoms.  Continue medical therapy.  3.  Hyperlipidemia: Continue treatment with atorvastatin with a target LDL of less than 70.   Disposition:   FU me as needed.   Signed,  Kathlyn Sacramento, MD  05/23/2017 5:51 PM    [place]

## 2017-05-23 NOTE — Patient Instructions (Signed)
Medication Instructions: Your physician recommends that you continue on your current medications as directed. Please refer to the Current Medication list given to you today.  If you need a refill on your cardiac medications before your next appointment, please call your pharmacy.   Follow-Up: Your physician wants you to follow-up as needed   Thank you for choosing [facility]!!      

## 2017-05-30 DIAGNOSIS — H353111 Nonexudative age-related macular degeneration, right eye, early dry stage: Secondary | ICD-10-CM | POA: Diagnosis not present

## 2017-05-30 DIAGNOSIS — H348312 Tributary (branch) retinal vein occlusion, right eye, stable: Secondary | ICD-10-CM | POA: Diagnosis not present

## 2017-05-30 DIAGNOSIS — H35371 Puckering of macula, right eye: Secondary | ICD-10-CM | POA: Diagnosis not present

## 2017-05-30 DIAGNOSIS — H353221 Exudative age-related macular degeneration, left eye, with active choroidal neovascularization: Secondary | ICD-10-CM | POA: Diagnosis not present

## 2017-06-02 DIAGNOSIS — E119 Type 2 diabetes mellitus without complications: Secondary | ICD-10-CM | POA: Diagnosis not present

## 2017-06-02 DIAGNOSIS — I1310 Hypertensive heart and chronic kidney disease without heart failure, with stage 1 through stage 4 chronic kidney disease, or unspecified chronic kidney disease: Secondary | ICD-10-CM | POA: Diagnosis not present

## 2017-06-09 ENCOUNTER — Other Ambulatory Visit: Payer: Self-pay | Admitting: Nurse Practitioner

## 2017-06-09 DIAGNOSIS — E785 Hyperlipidemia, unspecified: Secondary | ICD-10-CM

## 2017-06-19 DIAGNOSIS — I130 Hypertensive heart and chronic kidney disease with heart failure and stage 1 through stage 4 chronic kidney disease, or unspecified chronic kidney disease: Secondary | ICD-10-CM | POA: Diagnosis not present

## 2017-06-19 DIAGNOSIS — I110 Hypertensive heart disease with heart failure: Secondary | ICD-10-CM | POA: Diagnosis not present

## 2017-07-14 DIAGNOSIS — E113293 Type 2 diabetes mellitus with mild nonproliferative diabetic retinopathy without macular edema, bilateral: Secondary | ICD-10-CM | POA: Diagnosis not present

## 2017-07-14 DIAGNOSIS — H353221 Exudative age-related macular degeneration, left eye, with active choroidal neovascularization: Secondary | ICD-10-CM | POA: Diagnosis not present

## 2017-07-14 DIAGNOSIS — H353111 Nonexudative age-related macular degeneration, right eye, early dry stage: Secondary | ICD-10-CM | POA: Diagnosis not present

## 2017-07-14 DIAGNOSIS — H348312 Tributary (branch) retinal vein occlusion, right eye, stable: Secondary | ICD-10-CM | POA: Diagnosis not present

## 2017-08-07 DIAGNOSIS — M25562 Pain in left knee: Secondary | ICD-10-CM | POA: Diagnosis not present

## 2017-08-22 DIAGNOSIS — R0602 Shortness of breath: Secondary | ICD-10-CM | POA: Diagnosis not present

## 2017-08-22 DIAGNOSIS — R278 Other lack of coordination: Secondary | ICD-10-CM | POA: Diagnosis not present

## 2017-08-22 DIAGNOSIS — J449 Chronic obstructive pulmonary disease, unspecified: Secondary | ICD-10-CM | POA: Diagnosis not present

## 2017-08-22 DIAGNOSIS — I10 Essential (primary) hypertension: Secondary | ICD-10-CM | POA: Diagnosis not present

## 2017-08-22 DIAGNOSIS — M6281 Muscle weakness (generalized): Secondary | ICD-10-CM | POA: Diagnosis not present

## 2017-08-22 DIAGNOSIS — M79605 Pain in left leg: Secondary | ICD-10-CM | POA: Diagnosis not present

## 2017-08-23 DIAGNOSIS — M25552 Pain in left hip: Secondary | ICD-10-CM | POA: Diagnosis not present

## 2017-08-23 DIAGNOSIS — M25562 Pain in left knee: Secondary | ICD-10-CM | POA: Diagnosis not present

## 2017-08-28 DIAGNOSIS — I10 Essential (primary) hypertension: Secondary | ICD-10-CM | POA: Diagnosis not present

## 2017-08-28 DIAGNOSIS — R278 Other lack of coordination: Secondary | ICD-10-CM | POA: Diagnosis not present

## 2017-08-28 DIAGNOSIS — M6281 Muscle weakness (generalized): Secondary | ICD-10-CM | POA: Diagnosis not present

## 2017-08-28 DIAGNOSIS — I130 Hypertensive heart and chronic kidney disease with heart failure and stage 1 through stage 4 chronic kidney disease, or unspecified chronic kidney disease: Secondary | ICD-10-CM | POA: Diagnosis not present

## 2017-08-28 DIAGNOSIS — M79605 Pain in left leg: Secondary | ICD-10-CM | POA: Diagnosis not present

## 2017-08-28 DIAGNOSIS — J449 Chronic obstructive pulmonary disease, unspecified: Secondary | ICD-10-CM | POA: Diagnosis not present

## 2017-08-28 DIAGNOSIS — R0602 Shortness of breath: Secondary | ICD-10-CM | POA: Diagnosis not present

## 2017-08-28 DIAGNOSIS — I110 Hypertensive heart disease with heart failure: Secondary | ICD-10-CM | POA: Diagnosis not present

## 2017-08-29 DIAGNOSIS — M6281 Muscle weakness (generalized): Secondary | ICD-10-CM | POA: Diagnosis not present

## 2017-08-29 DIAGNOSIS — J449 Chronic obstructive pulmonary disease, unspecified: Secondary | ICD-10-CM | POA: Diagnosis not present

## 2017-08-29 DIAGNOSIS — M79605 Pain in left leg: Secondary | ICD-10-CM | POA: Diagnosis not present

## 2017-08-29 DIAGNOSIS — R278 Other lack of coordination: Secondary | ICD-10-CM | POA: Diagnosis not present

## 2017-08-29 DIAGNOSIS — R0602 Shortness of breath: Secondary | ICD-10-CM | POA: Diagnosis not present

## 2017-08-29 DIAGNOSIS — I10 Essential (primary) hypertension: Secondary | ICD-10-CM | POA: Diagnosis not present

## 2017-08-31 DIAGNOSIS — R0602 Shortness of breath: Secondary | ICD-10-CM | POA: Diagnosis not present

## 2017-08-31 DIAGNOSIS — M79605 Pain in left leg: Secondary | ICD-10-CM | POA: Diagnosis not present

## 2017-08-31 DIAGNOSIS — J449 Chronic obstructive pulmonary disease, unspecified: Secondary | ICD-10-CM | POA: Diagnosis not present

## 2017-08-31 DIAGNOSIS — I10 Essential (primary) hypertension: Secondary | ICD-10-CM | POA: Diagnosis not present

## 2017-08-31 DIAGNOSIS — M6281 Muscle weakness (generalized): Secondary | ICD-10-CM | POA: Diagnosis not present

## 2017-08-31 DIAGNOSIS — R278 Other lack of coordination: Secondary | ICD-10-CM | POA: Diagnosis not present

## 2017-09-01 DIAGNOSIS — M6281 Muscle weakness (generalized): Secondary | ICD-10-CM | POA: Diagnosis not present

## 2017-09-01 DIAGNOSIS — I10 Essential (primary) hypertension: Secondary | ICD-10-CM | POA: Diagnosis not present

## 2017-09-01 DIAGNOSIS — R278 Other lack of coordination: Secondary | ICD-10-CM | POA: Diagnosis not present

## 2017-09-01 DIAGNOSIS — J449 Chronic obstructive pulmonary disease, unspecified: Secondary | ICD-10-CM | POA: Diagnosis not present

## 2017-09-01 DIAGNOSIS — R0602 Shortness of breath: Secondary | ICD-10-CM | POA: Diagnosis not present

## 2017-09-01 DIAGNOSIS — M79605 Pain in left leg: Secondary | ICD-10-CM | POA: Diagnosis not present

## 2017-09-05 DIAGNOSIS — I10 Essential (primary) hypertension: Secondary | ICD-10-CM | POA: Diagnosis not present

## 2017-09-05 DIAGNOSIS — M79605 Pain in left leg: Secondary | ICD-10-CM | POA: Diagnosis not present

## 2017-09-05 DIAGNOSIS — R0602 Shortness of breath: Secondary | ICD-10-CM | POA: Diagnosis not present

## 2017-09-05 DIAGNOSIS — J449 Chronic obstructive pulmonary disease, unspecified: Secondary | ICD-10-CM | POA: Diagnosis not present

## 2017-09-05 DIAGNOSIS — M6281 Muscle weakness (generalized): Secondary | ICD-10-CM | POA: Diagnosis not present

## 2017-09-05 DIAGNOSIS — R278 Other lack of coordination: Secondary | ICD-10-CM | POA: Diagnosis not present

## 2017-09-06 DIAGNOSIS — R0602 Shortness of breath: Secondary | ICD-10-CM | POA: Diagnosis not present

## 2017-09-06 DIAGNOSIS — I10 Essential (primary) hypertension: Secondary | ICD-10-CM | POA: Diagnosis not present

## 2017-09-06 DIAGNOSIS — R278 Other lack of coordination: Secondary | ICD-10-CM | POA: Diagnosis not present

## 2017-09-06 DIAGNOSIS — M6281 Muscle weakness (generalized): Secondary | ICD-10-CM | POA: Diagnosis not present

## 2017-09-06 DIAGNOSIS — J449 Chronic obstructive pulmonary disease, unspecified: Secondary | ICD-10-CM | POA: Diagnosis not present

## 2017-09-06 DIAGNOSIS — M79605 Pain in left leg: Secondary | ICD-10-CM | POA: Diagnosis not present

## 2017-09-07 DIAGNOSIS — R278 Other lack of coordination: Secondary | ICD-10-CM | POA: Diagnosis not present

## 2017-09-07 DIAGNOSIS — R0602 Shortness of breath: Secondary | ICD-10-CM | POA: Diagnosis not present

## 2017-09-07 DIAGNOSIS — J449 Chronic obstructive pulmonary disease, unspecified: Secondary | ICD-10-CM | POA: Diagnosis not present

## 2017-09-07 DIAGNOSIS — M79605 Pain in left leg: Secondary | ICD-10-CM | POA: Diagnosis not present

## 2017-09-07 DIAGNOSIS — M6281 Muscle weakness (generalized): Secondary | ICD-10-CM | POA: Diagnosis not present

## 2017-09-07 DIAGNOSIS — I10 Essential (primary) hypertension: Secondary | ICD-10-CM | POA: Diagnosis not present

## 2017-09-08 DIAGNOSIS — H348312 Tributary (branch) retinal vein occlusion, right eye, stable: Secondary | ICD-10-CM | POA: Diagnosis not present

## 2017-09-08 DIAGNOSIS — I10 Essential (primary) hypertension: Secondary | ICD-10-CM | POA: Diagnosis not present

## 2017-09-08 DIAGNOSIS — M6281 Muscle weakness (generalized): Secondary | ICD-10-CM | POA: Diagnosis not present

## 2017-09-08 DIAGNOSIS — E113293 Type 2 diabetes mellitus with mild nonproliferative diabetic retinopathy without macular edema, bilateral: Secondary | ICD-10-CM | POA: Diagnosis not present

## 2017-09-08 DIAGNOSIS — M79605 Pain in left leg: Secondary | ICD-10-CM | POA: Diagnosis not present

## 2017-09-08 DIAGNOSIS — R0602 Shortness of breath: Secondary | ICD-10-CM | POA: Diagnosis not present

## 2017-09-08 DIAGNOSIS — R278 Other lack of coordination: Secondary | ICD-10-CM | POA: Diagnosis not present

## 2017-09-08 DIAGNOSIS — J449 Chronic obstructive pulmonary disease, unspecified: Secondary | ICD-10-CM | POA: Diagnosis not present

## 2017-09-08 DIAGNOSIS — H353111 Nonexudative age-related macular degeneration, right eye, early dry stage: Secondary | ICD-10-CM | POA: Diagnosis not present

## 2017-09-08 DIAGNOSIS — H353221 Exudative age-related macular degeneration, left eye, with active choroidal neovascularization: Secondary | ICD-10-CM | POA: Diagnosis not present

## 2017-09-09 DIAGNOSIS — M1990 Unspecified osteoarthritis, unspecified site: Secondary | ICD-10-CM | POA: Diagnosis not present

## 2017-09-09 DIAGNOSIS — Z7902 Long term (current) use of antithrombotics/antiplatelets: Secondary | ICD-10-CM | POA: Diagnosis not present

## 2017-09-09 DIAGNOSIS — I509 Heart failure, unspecified: Secondary | ICD-10-CM | POA: Diagnosis not present

## 2017-09-09 DIAGNOSIS — J449 Chronic obstructive pulmonary disease, unspecified: Secondary | ICD-10-CM | POA: Diagnosis not present

## 2017-09-09 DIAGNOSIS — E119 Type 2 diabetes mellitus without complications: Secondary | ICD-10-CM | POA: Diagnosis not present

## 2017-09-09 DIAGNOSIS — M79605 Pain in left leg: Secondary | ICD-10-CM | POA: Diagnosis not present

## 2017-09-09 DIAGNOSIS — I110 Hypertensive heart disease with heart failure: Secondary | ICD-10-CM | POA: Diagnosis not present

## 2017-09-09 DIAGNOSIS — Z794 Long term (current) use of insulin: Secondary | ICD-10-CM | POA: Diagnosis not present

## 2017-09-11 DIAGNOSIS — M79605 Pain in left leg: Secondary | ICD-10-CM | POA: Diagnosis not present

## 2017-09-11 DIAGNOSIS — J449 Chronic obstructive pulmonary disease, unspecified: Secondary | ICD-10-CM | POA: Diagnosis not present

## 2017-09-11 DIAGNOSIS — M1990 Unspecified osteoarthritis, unspecified site: Secondary | ICD-10-CM | POA: Diagnosis not present

## 2017-09-11 DIAGNOSIS — I509 Heart failure, unspecified: Secondary | ICD-10-CM | POA: Diagnosis not present

## 2017-09-11 DIAGNOSIS — I110 Hypertensive heart disease with heart failure: Secondary | ICD-10-CM | POA: Diagnosis not present

## 2017-09-11 DIAGNOSIS — E119 Type 2 diabetes mellitus without complications: Secondary | ICD-10-CM | POA: Diagnosis not present

## 2017-09-11 DIAGNOSIS — Z794 Long term (current) use of insulin: Secondary | ICD-10-CM | POA: Diagnosis not present

## 2017-09-11 DIAGNOSIS — Z7902 Long term (current) use of antithrombotics/antiplatelets: Secondary | ICD-10-CM | POA: Diagnosis not present

## 2017-09-12 ENCOUNTER — Encounter: Payer: Self-pay | Admitting: Podiatry

## 2017-09-12 ENCOUNTER — Ambulatory Visit: Payer: Medicare Other | Admitting: Podiatry

## 2017-09-12 DIAGNOSIS — M7732 Calcaneal spur, left foot: Secondary | ICD-10-CM | POA: Diagnosis not present

## 2017-09-12 MED ORDER — DICLOFENAC SODIUM 1 % TD GEL: 2.0000 g | Freq: Two times a day (BID) | TRANSDERMAL | 0 refills | Status: DC

## 2017-09-12 NOTE — Patient Instructions (Signed)
Achilles Tendinitis  with Rehab Achilles tendinitis is a disorder of the Achilles tendon. The Achilles tendon connects the large calf muscles (Gastrocnemius and Soleus) to the heel bone (calcaneus). This tendon is sometimes called the heel cord. It is important for pushing-off and standing on your toes and is important for walking, running, or jumping. Tendinitis is often caused by overuse and repetitive microtrauma. SYMPTOMS  Pain, tenderness, swelling, warmth, and redness may occur over the Achilles tendon even at rest.  Pain with pushing off, or flexing or extending the ankle.  Pain that is worsened after or during activity. CAUSES   Overuse sometimes seen with rapid increase in exercise programs or in sports requiring running and jumping.  Poor physical conditioning (strength and flexibility or endurance).  Running sports, especially training running down hills.  Inadequate warm-up before practice or play or failure to stretch before participation.  Injury to the tendon. PREVENTION   Warm up and stretch before practice or competition.  Allow time for adequate rest and recovery between practices and competition.  Keep up conditioning.  Keep up ankle and leg flexibility.  Improve or keep muscle strength and endurance.  Improve cardiovascular fitness.  Use proper technique.  Use proper equipment (shoes, skates).  To help prevent recurrence, taping, protective strapping, or an adhesive bandage may be recommended for several weeks after healing is complete. PROGNOSIS   Recovery may take weeks to several months to heal.  Longer recovery is expected if symptoms have been prolonged.  Recovery is usually quicker if the inflammation is due to a direct blow as compared with overuse or sudden strain. RELATED COMPLICATIONS   Healing time will be prolonged if the condition is not correctly treated. The injury must be given plenty of time to heal.  Symptoms can reoccur if  activity is resumed too soon.  Untreated, tendinitis may increase the risk of tendon rupture requiring additional time for recovery and possibly surgery. TREATMENT   The first treatment consists of rest anti-inflammatory medication, and ice to relieve the pain.  Stretching and strengthening exercises after resolution of pain will likely help reduce the risk of recurrence. Referral to a physical therapist or athletic trainer for further evaluation and treatment may be helpful.  A walking boot or cast may be recommended to rest the Achilles tendon. This can help break the cycle of inflammation and microtrauma.  Arch supports (orthotics) may be prescribed or recommended by your caregiver as an adjunct to therapy and rest.  Surgery to remove the inflamed tendon lining or degenerated tendon tissue is rarely necessary and has shown less than predictable results. MEDICATION   Nonsteroidal anti-inflammatory medications, such as aspirin and ibuprofen, may be used for pain and inflammation relief. Do not take within 7 days before surgery. Take these as directed by your caregiver. Contact your caregiver immediately if any bleeding, stomach upset, or signs of allergic reaction occur. Other minor pain relievers, such as acetaminophen, may also be used.  Pain relievers may be prescribed as necessary by your caregiver. Do not take prescription pain medication for longer than 4 to 7 days. Use only as directed and only as much as you need. HEAT AND COLD  Cold is used to relieve pain and reduce inflammation for acute and chronic Achilles tendinitis. Cold should be applied for 10 to 15 minutes every 2 to 3 hours for inflammation and pain and immediately after any activity that aggravates your symptoms. Use ice packs or an ice massage.  Heat may be used   before performing stretching and strengthening activities prescribed by your caregiver. Use a heat pack or a warm soak. SEEK MEDICAL CARE IF:  Symptoms get  worse or do not improve in 2 weeks despite treatment.  New, unexplained symptoms develop. Drugs used in treatment may produce side effects.   EXERCISES-- hold each stretch for 30 seconds and repeat 10 times.  Complete each stretch 3 times per day.   RANGE OF MOTION (ROM) AND STRETCHING EXERCISES - Achilles Tendinitis  These exercises may help you when beginning to rehabilitate your injury. Your symptoms may resolve with or without further involvement from your physician, physical therapist or athletic trainer. While completing these exercises, remember:   Restoring tissue flexibility helps normal motion to return to the joints. This allows healthier, less painful movement and activity.  An effective stretch should be held for at least 30 seconds.  A stretch should never be painful. You should only feel a gentle lengthening or release in the stretched tissue. STRETCH  Gastroc, Standing   Place hands on wall.  Extend right / left leg, keeping the front knee somewhat bent.  Slightly point your toes inward on your back foot.  Keeping your right / left heel on the floor and your knee straight, shift your weight toward the wall, not allowing your back to arch.  You should feel a gentle stretch in the right / left calf. Hold this position for __________ seconds. Repeat __________ times. Complete this stretch __________ times per day. STRETCH  Soleus, Standing   Place hands on wall.  Extend right / left leg, keeping the other knee somewhat bent.  Slightly point your toes inward on your back foot.  Keep your right / left heel on the floor, bend your back knee, and slightly shift your weight over the back leg so that you feel a gentle stretch deep in your back calf.  Hold this position for __________ seconds. Repeat __________ times. Complete this stretch __________ times per day. STRETCH  Gastrocsoleus, Standing  Note: This exercise can place a lot of stress on your foot and ankle.  Please complete this exercise only if specifically instructed by your caregiver.   Place the ball of your right / left foot on a step, keeping your other foot firmly on the same step.  Hold on to the wall or a rail for balance.  Slowly lift your other foot, allowing your body weight to press your heel down over the edge of the step.  You should feel a stretch in your right / left calf.  Hold this position for __________ seconds.  Repeat this exercise with a slight bend in your knee. Repeat __________ times. Complete this stretch __________ times per day.  STRENGTHENING EXERCISES - Achilles Tendinitis These exercises may help you when beginning to rehabilitate your injury. They may resolve your symptoms with or without further involvement from your physician, physical therapist or athletic trainer. While completing these exercises, remember:   Muscles can gain both the endurance and the strength needed for everyday activities through controlled exercises.  Complete these exercises as instructed by your physician, physical therapist or athletic trainer. Progress the resistance and repetitions only as guided.  You may experience muscle soreness or fatigue, but the pain or discomfort you are trying to eliminate should never worsen during these exercises. If this pain does worsen, stop and make certain you are following the directions exactly. If the pain is still present after adjustments, discontinue the exercise until you can discuss   the trouble with your clinician. STRENGTH - Plantar-flexors   Sit with your right / left leg extended. Holding onto both ends of a rubber exercise band/tubing, loop it around the ball of your foot. Keep a slight tension in the band.  Slowly push your toes away from you, pointing them downward.  Hold this position for __________ seconds. Return slowly, controlling the tension in the band/tubing. Repeat __________ times. Complete this exercise __________ times  per day.  STRENGTH - Plantar-flexors   Stand with your feet shoulder width apart. Steady yourself with a wall or table using as little support as needed.  Keeping your weight evenly spread over the width of your feet, rise up on your toes.*  Hold this position for __________ seconds. Repeat __________ times. Complete this exercise __________ times per day.  *If this is too easy, shift your weight toward your right / left leg until you feel challenged. Ultimately, you may be asked to do this exercise with your right / left foot only. STRENGTH  Plantar-flexors, Eccentric  Note: This exercise can place a lot of stress on your foot and ankle. Please complete this exercise only if specifically instructed by your caregiver.   Place the balls of your feet on a step. With your hands, use only enough support from a wall or rail to keep your balance.  Keep your knees straight and rise up on your toes.  Slowly shift your weight entirely to your right / left toes and pick up your opposite foot. Gently and with controlled movement, lower your weight through your right / left foot so that your heel drops below the level of the step. You will feel a slight stretch in the back of your calf at the end position.  Use the healthy leg to help rise up onto the balls of both feet, then lower weight only on the right / left leg again. Build up to 15 repetitions. Then progress to 3 consecutive sets of 15 repetitions.*  After completing the above exercise, complete the same exercise with a slight knee bend (about 30 degrees). Again, build up to 15 repetitions. Then progress to 3 consecutive sets of 15 repetitions.* Perform this exercise __________ times per day.  *When you easily complete 3 sets of 15, your physician, physical therapist or athletic trainer may advise you to add resistance by wearing a backpack filled with additional weight. STRENGTH - Plantar Flexors, Seated   Sit on a chair that allows your feet  to rest flat on the ground. If necessary, sit at the edge of the chair.  Keeping your toes firmly on the ground, lift your right / left heel as far as you can without increasing any discomfort in your ankle. Repeat __________ times. Complete this exercise __________ times a day. *If instructed by your physician, physical therapist or athletic trainer, you may add ____________________ of resistance by placing a weighted object on your right / left knee. Document Released: 10/27/2004 Document Revised: 06/20/2011 Document Reviewed: 07/10/2008 [employer] Patient Information 2014 [employer].    

## 2017-09-13 DIAGNOSIS — I509 Heart failure, unspecified: Secondary | ICD-10-CM | POA: Diagnosis not present

## 2017-09-13 DIAGNOSIS — J449 Chronic obstructive pulmonary disease, unspecified: Secondary | ICD-10-CM | POA: Diagnosis not present

## 2017-09-13 DIAGNOSIS — M79605 Pain in left leg: Secondary | ICD-10-CM | POA: Diagnosis not present

## 2017-09-13 DIAGNOSIS — I110 Hypertensive heart disease with heart failure: Secondary | ICD-10-CM | POA: Diagnosis not present

## 2017-09-13 DIAGNOSIS — M1990 Unspecified osteoarthritis, unspecified site: Secondary | ICD-10-CM | POA: Diagnosis not present

## 2017-09-13 DIAGNOSIS — Z7902 Long term (current) use of antithrombotics/antiplatelets: Secondary | ICD-10-CM | POA: Diagnosis not present

## 2017-09-13 DIAGNOSIS — Z794 Long term (current) use of insulin: Secondary | ICD-10-CM | POA: Diagnosis not present

## 2017-09-13 DIAGNOSIS — M7732 Calcaneal spur, left foot: Secondary | ICD-10-CM | POA: Insufficient documentation

## 2017-09-13 DIAGNOSIS — E119 Type 2 diabetes mellitus without complications: Secondary | ICD-10-CM | POA: Diagnosis not present

## 2017-09-13 NOTE — Progress Notes (Signed)
Subjective: 84 year old female presents the office today for concerns of a gradual onset of pain to the back of her left heel.  She states that she only has pain to the back of the heel when she puts pressure to the area.  She has no pain with walking and she has not had any recent injury or trauma.  She has not noticed any skin breakdown or blisters or irritation to her skin.  She said no recent treatment for this.  She has no other concerns today. Denies any systemic complaints such as fevers, chills, nausea, vomiting. No acute changes since last appointment, and no other complaints at this time.   Objective: AAO x3, NAD DP/PT pulses palpable bilaterally, CRT less than 3 seconds To the posterior aspect of the left heel is a prominent retrocalcaneal spur and this is subjectively where she gets discomfort however there is no significant discomfort on exam today.  Achilles tendon appears to be intact.  There is no pain with lateral compression of calcaneus.  There is no overlying edema, erythema, increase in warmth.  Plantar fascial intact.  No other areas of tenderness.  No open lesions or pre-ulcerative lesions.  No pain with calf compression, swelling, warmth, erythema  Assessment: Posterior heel spur left side  Plan: -All treatment options discussed with the patient including all alternatives, risks, complications.  -I reviewed the previous x-rays.  Did not get new x-rays today as there is no injury and appears to be localized to the bone spur.  I dispensed offloading pads for her.  We discussed stretching exercises.  Voltaren gel ordered.  Also discussed when she is in bed to float her heels with pillows.  Monitoring skin breakdown. -Patient encouraged to call the office with any questions, concerns, change in symptoms.   [name] DPM

## 2017-09-14 DIAGNOSIS — I509 Heart failure, unspecified: Secondary | ICD-10-CM | POA: Diagnosis not present

## 2017-09-14 DIAGNOSIS — I110 Hypertensive heart disease with heart failure: Secondary | ICD-10-CM | POA: Diagnosis not present

## 2017-09-14 DIAGNOSIS — Z794 Long term (current) use of insulin: Secondary | ICD-10-CM | POA: Diagnosis not present

## 2017-09-14 DIAGNOSIS — Z7902 Long term (current) use of antithrombotics/antiplatelets: Secondary | ICD-10-CM | POA: Diagnosis not present

## 2017-09-14 DIAGNOSIS — M1990 Unspecified osteoarthritis, unspecified site: Secondary | ICD-10-CM | POA: Diagnosis not present

## 2017-09-14 DIAGNOSIS — J449 Chronic obstructive pulmonary disease, unspecified: Secondary | ICD-10-CM | POA: Diagnosis not present

## 2017-09-14 DIAGNOSIS — M79605 Pain in left leg: Secondary | ICD-10-CM | POA: Diagnosis not present

## 2017-09-14 DIAGNOSIS — E119 Type 2 diabetes mellitus without complications: Secondary | ICD-10-CM | POA: Diagnosis not present

## 2017-09-15 DIAGNOSIS — E119 Type 2 diabetes mellitus without complications: Secondary | ICD-10-CM | POA: Diagnosis not present

## 2017-09-15 DIAGNOSIS — Z794 Long term (current) use of insulin: Secondary | ICD-10-CM | POA: Diagnosis not present

## 2017-09-15 DIAGNOSIS — Z7902 Long term (current) use of antithrombotics/antiplatelets: Secondary | ICD-10-CM | POA: Diagnosis not present

## 2017-09-15 DIAGNOSIS — I110 Hypertensive heart disease with heart failure: Secondary | ICD-10-CM | POA: Diagnosis not present

## 2017-09-15 DIAGNOSIS — J449 Chronic obstructive pulmonary disease, unspecified: Secondary | ICD-10-CM | POA: Diagnosis not present

## 2017-09-15 DIAGNOSIS — M1990 Unspecified osteoarthritis, unspecified site: Secondary | ICD-10-CM | POA: Diagnosis not present

## 2017-09-15 DIAGNOSIS — M79605 Pain in left leg: Secondary | ICD-10-CM | POA: Diagnosis not present

## 2017-09-15 DIAGNOSIS — I509 Heart failure, unspecified: Secondary | ICD-10-CM | POA: Diagnosis not present

## 2017-09-18 DIAGNOSIS — E118 Type 2 diabetes mellitus with unspecified complications: Secondary | ICD-10-CM | POA: Diagnosis not present

## 2017-09-18 DIAGNOSIS — I509 Heart failure, unspecified: Secondary | ICD-10-CM | POA: Diagnosis not present

## 2017-09-18 DIAGNOSIS — M109 Gout, unspecified: Secondary | ICD-10-CM | POA: Diagnosis not present

## 2017-09-18 DIAGNOSIS — M1990 Unspecified osteoarthritis, unspecified site: Secondary | ICD-10-CM | POA: Diagnosis not present

## 2017-09-18 DIAGNOSIS — Z794 Long term (current) use of insulin: Secondary | ICD-10-CM | POA: Diagnosis not present

## 2017-09-18 DIAGNOSIS — J449 Chronic obstructive pulmonary disease, unspecified: Secondary | ICD-10-CM | POA: Diagnosis not present

## 2017-09-18 DIAGNOSIS — E119 Type 2 diabetes mellitus without complications: Secondary | ICD-10-CM | POA: Diagnosis not present

## 2017-09-18 DIAGNOSIS — M79605 Pain in left leg: Secondary | ICD-10-CM | POA: Diagnosis not present

## 2017-09-18 DIAGNOSIS — I1310 Hypertensive heart and chronic kidney disease without heart failure, with stage 1 through stage 4 chronic kidney disease, or unspecified chronic kidney disease: Secondary | ICD-10-CM | POA: Diagnosis not present

## 2017-09-18 DIAGNOSIS — Z7902 Long term (current) use of antithrombotics/antiplatelets: Secondary | ICD-10-CM | POA: Diagnosis not present

## 2017-09-18 DIAGNOSIS — I110 Hypertensive heart disease with heart failure: Secondary | ICD-10-CM | POA: Diagnosis not present

## 2017-09-19 DIAGNOSIS — M1990 Unspecified osteoarthritis, unspecified site: Secondary | ICD-10-CM | POA: Diagnosis not present

## 2017-09-19 DIAGNOSIS — Z794 Long term (current) use of insulin: Secondary | ICD-10-CM | POA: Diagnosis not present

## 2017-09-19 DIAGNOSIS — I509 Heart failure, unspecified: Secondary | ICD-10-CM | POA: Diagnosis not present

## 2017-09-19 DIAGNOSIS — E119 Type 2 diabetes mellitus without complications: Secondary | ICD-10-CM | POA: Diagnosis not present

## 2017-09-19 DIAGNOSIS — I110 Hypertensive heart disease with heart failure: Secondary | ICD-10-CM | POA: Diagnosis not present

## 2017-09-19 DIAGNOSIS — M79605 Pain in left leg: Secondary | ICD-10-CM | POA: Diagnosis not present

## 2017-09-19 DIAGNOSIS — J449 Chronic obstructive pulmonary disease, unspecified: Secondary | ICD-10-CM | POA: Diagnosis not present

## 2017-09-19 DIAGNOSIS — Z7902 Long term (current) use of antithrombotics/antiplatelets: Secondary | ICD-10-CM | POA: Diagnosis not present

## 2017-09-20 DIAGNOSIS — E119 Type 2 diabetes mellitus without complications: Secondary | ICD-10-CM | POA: Diagnosis not present

## 2017-09-20 DIAGNOSIS — M1990 Unspecified osteoarthritis, unspecified site: Secondary | ICD-10-CM | POA: Diagnosis not present

## 2017-09-20 DIAGNOSIS — M79605 Pain in left leg: Secondary | ICD-10-CM | POA: Diagnosis not present

## 2017-09-20 DIAGNOSIS — I509 Heart failure, unspecified: Secondary | ICD-10-CM | POA: Diagnosis not present

## 2017-09-20 DIAGNOSIS — J449 Chronic obstructive pulmonary disease, unspecified: Secondary | ICD-10-CM | POA: Diagnosis not present

## 2017-09-20 DIAGNOSIS — Z794 Long term (current) use of insulin: Secondary | ICD-10-CM | POA: Diagnosis not present

## 2017-09-20 DIAGNOSIS — I110 Hypertensive heart disease with heart failure: Secondary | ICD-10-CM | POA: Diagnosis not present

## 2017-09-20 DIAGNOSIS — Z7902 Long term (current) use of antithrombotics/antiplatelets: Secondary | ICD-10-CM | POA: Diagnosis not present

## 2017-09-21 DIAGNOSIS — I509 Heart failure, unspecified: Secondary | ICD-10-CM | POA: Diagnosis not present

## 2017-09-21 DIAGNOSIS — M1990 Unspecified osteoarthritis, unspecified site: Secondary | ICD-10-CM | POA: Diagnosis not present

## 2017-09-21 DIAGNOSIS — E119 Type 2 diabetes mellitus without complications: Secondary | ICD-10-CM | POA: Diagnosis not present

## 2017-09-21 DIAGNOSIS — I110 Hypertensive heart disease with heart failure: Secondary | ICD-10-CM | POA: Diagnosis not present

## 2017-09-21 DIAGNOSIS — Z7902 Long term (current) use of antithrombotics/antiplatelets: Secondary | ICD-10-CM | POA: Diagnosis not present

## 2017-09-21 DIAGNOSIS — J449 Chronic obstructive pulmonary disease, unspecified: Secondary | ICD-10-CM | POA: Diagnosis not present

## 2017-09-21 DIAGNOSIS — M79605 Pain in left leg: Secondary | ICD-10-CM | POA: Diagnosis not present

## 2017-09-21 DIAGNOSIS — Z794 Long term (current) use of insulin: Secondary | ICD-10-CM | POA: Diagnosis not present

## 2017-09-25 DIAGNOSIS — Z7902 Long term (current) use of antithrombotics/antiplatelets: Secondary | ICD-10-CM | POA: Diagnosis not present

## 2017-09-25 DIAGNOSIS — Z794 Long term (current) use of insulin: Secondary | ICD-10-CM | POA: Diagnosis not present

## 2017-09-25 DIAGNOSIS — I509 Heart failure, unspecified: Secondary | ICD-10-CM | POA: Diagnosis not present

## 2017-09-25 DIAGNOSIS — M79605 Pain in left leg: Secondary | ICD-10-CM | POA: Diagnosis not present

## 2017-09-25 DIAGNOSIS — I110 Hypertensive heart disease with heart failure: Secondary | ICD-10-CM | POA: Diagnosis not present

## 2017-09-25 DIAGNOSIS — J449 Chronic obstructive pulmonary disease, unspecified: Secondary | ICD-10-CM | POA: Diagnosis not present

## 2017-09-25 DIAGNOSIS — E119 Type 2 diabetes mellitus without complications: Secondary | ICD-10-CM | POA: Diagnosis not present

## 2017-09-25 DIAGNOSIS — M1990 Unspecified osteoarthritis, unspecified site: Secondary | ICD-10-CM | POA: Diagnosis not present

## 2017-09-27 DIAGNOSIS — Z7902 Long term (current) use of antithrombotics/antiplatelets: Secondary | ICD-10-CM | POA: Diagnosis not present

## 2017-09-27 DIAGNOSIS — Z794 Long term (current) use of insulin: Secondary | ICD-10-CM | POA: Diagnosis not present

## 2017-09-27 DIAGNOSIS — E119 Type 2 diabetes mellitus without complications: Secondary | ICD-10-CM | POA: Diagnosis not present

## 2017-09-27 DIAGNOSIS — M79605 Pain in left leg: Secondary | ICD-10-CM | POA: Diagnosis not present

## 2017-09-27 DIAGNOSIS — I110 Hypertensive heart disease with heart failure: Secondary | ICD-10-CM | POA: Diagnosis not present

## 2017-09-27 DIAGNOSIS — J449 Chronic obstructive pulmonary disease, unspecified: Secondary | ICD-10-CM | POA: Diagnosis not present

## 2017-09-27 DIAGNOSIS — M1990 Unspecified osteoarthritis, unspecified site: Secondary | ICD-10-CM | POA: Diagnosis not present

## 2017-09-27 DIAGNOSIS — I509 Heart failure, unspecified: Secondary | ICD-10-CM | POA: Diagnosis not present

## 2017-09-28 DIAGNOSIS — M79605 Pain in left leg: Secondary | ICD-10-CM | POA: Diagnosis not present

## 2017-09-28 DIAGNOSIS — I110 Hypertensive heart disease with heart failure: Secondary | ICD-10-CM | POA: Diagnosis not present

## 2017-09-28 DIAGNOSIS — J449 Chronic obstructive pulmonary disease, unspecified: Secondary | ICD-10-CM | POA: Diagnosis not present

## 2017-09-28 DIAGNOSIS — E119 Type 2 diabetes mellitus without complications: Secondary | ICD-10-CM | POA: Diagnosis not present

## 2017-09-28 DIAGNOSIS — Z7902 Long term (current) use of antithrombotics/antiplatelets: Secondary | ICD-10-CM | POA: Diagnosis not present

## 2017-09-28 DIAGNOSIS — Z794 Long term (current) use of insulin: Secondary | ICD-10-CM | POA: Diagnosis not present

## 2017-09-28 DIAGNOSIS — M1990 Unspecified osteoarthritis, unspecified site: Secondary | ICD-10-CM | POA: Diagnosis not present

## 2017-09-28 DIAGNOSIS — I509 Heart failure, unspecified: Secondary | ICD-10-CM | POA: Diagnosis not present

## 2017-09-29 ENCOUNTER — Encounter: Payer: Self-pay | Admitting: Cardiology

## 2017-10-03 DIAGNOSIS — J449 Chronic obstructive pulmonary disease, unspecified: Secondary | ICD-10-CM | POA: Diagnosis not present

## 2017-10-03 DIAGNOSIS — E119 Type 2 diabetes mellitus without complications: Secondary | ICD-10-CM | POA: Diagnosis not present

## 2017-10-03 DIAGNOSIS — I509 Heart failure, unspecified: Secondary | ICD-10-CM | POA: Diagnosis not present

## 2017-10-03 DIAGNOSIS — M1990 Unspecified osteoarthritis, unspecified site: Secondary | ICD-10-CM | POA: Diagnosis not present

## 2017-10-03 DIAGNOSIS — M79605 Pain in left leg: Secondary | ICD-10-CM | POA: Diagnosis not present

## 2017-10-03 DIAGNOSIS — I110 Hypertensive heart disease with heart failure: Secondary | ICD-10-CM | POA: Diagnosis not present

## 2017-10-03 DIAGNOSIS — Z7902 Long term (current) use of antithrombotics/antiplatelets: Secondary | ICD-10-CM | POA: Diagnosis not present

## 2017-10-03 DIAGNOSIS — Z794 Long term (current) use of insulin: Secondary | ICD-10-CM | POA: Diagnosis not present

## 2017-10-05 DIAGNOSIS — E119 Type 2 diabetes mellitus without complications: Secondary | ICD-10-CM | POA: Diagnosis not present

## 2017-10-05 DIAGNOSIS — J449 Chronic obstructive pulmonary disease, unspecified: Secondary | ICD-10-CM | POA: Diagnosis not present

## 2017-10-05 DIAGNOSIS — Z794 Long term (current) use of insulin: Secondary | ICD-10-CM | POA: Diagnosis not present

## 2017-10-05 DIAGNOSIS — M1990 Unspecified osteoarthritis, unspecified site: Secondary | ICD-10-CM | POA: Diagnosis not present

## 2017-10-05 DIAGNOSIS — M79605 Pain in left leg: Secondary | ICD-10-CM | POA: Diagnosis not present

## 2017-10-05 DIAGNOSIS — Z7902 Long term (current) use of antithrombotics/antiplatelets: Secondary | ICD-10-CM | POA: Diagnosis not present

## 2017-10-05 DIAGNOSIS — I509 Heart failure, unspecified: Secondary | ICD-10-CM | POA: Diagnosis not present

## 2017-10-05 DIAGNOSIS — I110 Hypertensive heart disease with heart failure: Secondary | ICD-10-CM | POA: Diagnosis not present

## 2017-10-11 DIAGNOSIS — J449 Chronic obstructive pulmonary disease, unspecified: Secondary | ICD-10-CM | POA: Diagnosis not present

## 2017-10-11 DIAGNOSIS — I110 Hypertensive heart disease with heart failure: Secondary | ICD-10-CM | POA: Diagnosis not present

## 2017-10-11 DIAGNOSIS — E119 Type 2 diabetes mellitus without complications: Secondary | ICD-10-CM | POA: Diagnosis not present

## 2017-10-11 DIAGNOSIS — Z7902 Long term (current) use of antithrombotics/antiplatelets: Secondary | ICD-10-CM | POA: Diagnosis not present

## 2017-10-11 DIAGNOSIS — M1990 Unspecified osteoarthritis, unspecified site: Secondary | ICD-10-CM | POA: Diagnosis not present

## 2017-10-11 DIAGNOSIS — I509 Heart failure, unspecified: Secondary | ICD-10-CM | POA: Diagnosis not present

## 2017-10-11 DIAGNOSIS — M79605 Pain in left leg: Secondary | ICD-10-CM | POA: Diagnosis not present

## 2017-10-11 DIAGNOSIS — Z794 Long term (current) use of insulin: Secondary | ICD-10-CM | POA: Diagnosis not present

## 2017-10-16 ENCOUNTER — Ambulatory Visit (INDEPENDENT_AMBULATORY_CARE_PROVIDER_SITE_OTHER): Payer: Medicare Other | Admitting: *Deleted

## 2017-10-16 DIAGNOSIS — I495 Sick sinus syndrome: Secondary | ICD-10-CM | POA: Diagnosis not present

## 2017-10-16 NOTE — Progress Notes (Signed)
Remote pacemaker transmission.   

## 2017-10-18 DIAGNOSIS — M1990 Unspecified osteoarthritis, unspecified site: Secondary | ICD-10-CM | POA: Diagnosis not present

## 2017-10-18 DIAGNOSIS — Z7902 Long term (current) use of antithrombotics/antiplatelets: Secondary | ICD-10-CM | POA: Diagnosis not present

## 2017-10-18 DIAGNOSIS — M79605 Pain in left leg: Secondary | ICD-10-CM | POA: Diagnosis not present

## 2017-10-18 DIAGNOSIS — Z794 Long term (current) use of insulin: Secondary | ICD-10-CM | POA: Diagnosis not present

## 2017-10-18 DIAGNOSIS — I509 Heart failure, unspecified: Secondary | ICD-10-CM | POA: Diagnosis not present

## 2017-10-18 DIAGNOSIS — J449 Chronic obstructive pulmonary disease, unspecified: Secondary | ICD-10-CM | POA: Diagnosis not present

## 2017-10-18 DIAGNOSIS — E119 Type 2 diabetes mellitus without complications: Secondary | ICD-10-CM | POA: Diagnosis not present

## 2017-10-18 DIAGNOSIS — I110 Hypertensive heart disease with heart failure: Secondary | ICD-10-CM | POA: Diagnosis not present

## 2017-10-20 ENCOUNTER — Other Ambulatory Visit: Payer: Self-pay | Admitting: Cardiovascular Disease

## 2017-10-20 DIAGNOSIS — I5032 Chronic diastolic (congestive) heart failure: Secondary | ICD-10-CM

## 2017-10-20 LAB — CUP PACEART REMOTE DEVICE CHECK
Brady Statistic AP VS Percent: 8 %
Brady Statistic AS VP Percent: 14 %
Brady Statistic AS VS Percent: 3 %
Date Time Interrogation Session: 20190708123714
Implantable Lead Implant Date: 20131126
Implantable Lead Location: 753859
Implantable Lead Location: 753860
Implantable Lead Model: 5092
Lead Channel Impedance Value: 458 Ohm
Lead Channel Pacing Threshold Amplitude: 0.5 V
Lead Channel Pacing Threshold Amplitude: 1 V
Lead Channel Pacing Threshold Pulse Width: 0.4 ms
Lead Channel Pacing Threshold Pulse Width: 0.4 ms
Lead Channel Setting Pacing Amplitude: 2.5 V
Lead Channel Setting Sensing Sensitivity: 2 mV
MDC IDC LEAD IMPLANT DT: 20131126
MDC IDC MSMT BATTERY IMPEDANCE: 430 Ohm
MDC IDC MSMT BATTERY REMAINING LONGEVITY: 82 mo
MDC IDC MSMT BATTERY VOLTAGE: 2.79 V
MDC IDC MSMT LEADCHNL RA IMPEDANCE VALUE: 427 Ohm
MDC IDC PG IMPLANT DT: 20131126
MDC IDC SET LEADCHNL RA PACING AMPLITUDE: 2 V
MDC IDC SET LEADCHNL RV PACING PULSEWIDTH: 0.4 ms
MDC IDC STAT BRADY AP VP PERCENT: 75 %

## 2017-10-20 NOTE — Telephone Encounter (Signed)
clopidogrel (PLAVIX) 75 MG tablet  Medication  Date: 11/29/2016 [facility] [address] [facility] Ordering/Authorizing: Tonny Bollmanooper, Michael, MD  Order Providers   Prescribing Provider Encounter Provider  Tonny Bollmanooper, Michael, MD Tonny Bollmanooper, Michael, MD  Outpatient Medication Detail    Disp Refills Start End   clopidogrel (PLAVIX) 75 MG tablet 30 tablet 11 11/29/2016    Sig: TAKE 1 TABLET BY MOUTH DAILY   Sent to pharmacy as: clopidogrel (PLAVIX) 75 MG tablet   E-Prescribing Status: Receipt confirmed by pharmacy (11/29/2016 2:54 PM EDT)   Associated Diagnoses   Chronic diastolic heart failure [facility](HCC)     Pharmacy   [facility] [place] [phone]

## 2017-10-26 ENCOUNTER — Other Ambulatory Visit: Payer: Self-pay | Admitting: Cardiovascular Disease

## 2017-10-26 DIAGNOSIS — I5032 Chronic diastolic (congestive) heart failure: Secondary | ICD-10-CM

## 2017-10-27 DIAGNOSIS — E119 Type 2 diabetes mellitus without complications: Secondary | ICD-10-CM | POA: Diagnosis not present

## 2017-10-27 DIAGNOSIS — R609 Edema, unspecified: Secondary | ICD-10-CM | POA: Diagnosis not present

## 2017-10-27 DIAGNOSIS — I10 Essential (primary) hypertension: Secondary | ICD-10-CM | POA: Diagnosis not present

## 2017-11-21 DIAGNOSIS — H348312 Tributary (branch) retinal vein occlusion, right eye, stable: Secondary | ICD-10-CM | POA: Diagnosis not present

## 2017-11-21 DIAGNOSIS — E113293 Type 2 diabetes mellitus with mild nonproliferative diabetic retinopathy without macular edema, bilateral: Secondary | ICD-10-CM | POA: Diagnosis not present

## 2017-11-21 DIAGNOSIS — H353111 Nonexudative age-related macular degeneration, right eye, early dry stage: Secondary | ICD-10-CM | POA: Diagnosis not present

## 2017-11-21 DIAGNOSIS — H353221 Exudative age-related macular degeneration, left eye, with active choroidal neovascularization: Secondary | ICD-10-CM | POA: Diagnosis not present

## 2017-11-28 DIAGNOSIS — E119 Type 2 diabetes mellitus without complications: Secondary | ICD-10-CM | POA: Diagnosis not present

## 2017-11-28 DIAGNOSIS — Z0000 Encounter for general adult medical examination without abnormal findings: Secondary | ICD-10-CM | POA: Diagnosis not present

## 2017-11-28 DIAGNOSIS — I1310 Hypertensive heart and chronic kidney disease without heart failure, with stage 1 through stage 4 chronic kidney disease, or unspecified chronic kidney disease: Secondary | ICD-10-CM | POA: Diagnosis not present

## 2017-11-28 DIAGNOSIS — R197 Diarrhea, unspecified: Secondary | ICD-10-CM | POA: Diagnosis not present

## 2017-11-28 DIAGNOSIS — I130 Hypertensive heart and chronic kidney disease with heart failure and stage 1 through stage 4 chronic kidney disease, or unspecified chronic kidney disease: Secondary | ICD-10-CM | POA: Diagnosis not present

## 2017-12-01 ENCOUNTER — Telehealth: Payer: Self-pay | Admitting: Cardiovascular Disease

## 2017-12-01 NOTE — Telephone Encounter (Signed)
New Message    1) Are you calling to confirm a diagnosis or obtain personal health information (Y/N)? Y  2) If so, what information is requested? Most recent EF, BP and HR   Please route to Medical Records or your medical records site representative

## 2017-12-19 DIAGNOSIS — J449 Chronic obstructive pulmonary disease, unspecified: Secondary | ICD-10-CM | POA: Diagnosis not present

## 2017-12-19 DIAGNOSIS — E119 Type 2 diabetes mellitus without complications: Secondary | ICD-10-CM | POA: Diagnosis not present

## 2017-12-19 DIAGNOSIS — J45998 Other asthma: Secondary | ICD-10-CM | POA: Diagnosis not present

## 2017-12-27 DIAGNOSIS — R609 Edema, unspecified: Secondary | ICD-10-CM | POA: Diagnosis not present

## 2017-12-27 DIAGNOSIS — E0821 Diabetes mellitus due to underlying condition with diabetic nephropathy: Secondary | ICD-10-CM | POA: Diagnosis not present

## 2017-12-27 DIAGNOSIS — E031 Congenital hypothyroidism without goiter: Secondary | ICD-10-CM | POA: Diagnosis not present

## 2017-12-27 DIAGNOSIS — J209 Acute bronchitis, unspecified: Secondary | ICD-10-CM | POA: Diagnosis not present

## 2017-12-27 DIAGNOSIS — E039 Hypothyroidism, unspecified: Secondary | ICD-10-CM | POA: Diagnosis not present

## 2017-12-27 DIAGNOSIS — I1310 Hypertensive heart and chronic kidney disease without heart failure, with stage 1 through stage 4 chronic kidney disease, or unspecified chronic kidney disease: Secondary | ICD-10-CM | POA: Diagnosis not present

## 2017-12-29 ENCOUNTER — Emergency Department (HOSPITAL_COMMUNITY)
Admission: EM | Admit: 2017-12-29 | Discharge: 2017-12-29 | Disposition: A | Discharge status: Home / Self Care | Payer: Medicare Other | Attending: Emergency Medicine | Admitting: Emergency Medicine

## 2017-12-29 ENCOUNTER — Encounter (HOSPITAL_COMMUNITY): Payer: Self-pay | Admitting: Emergency Medicine

## 2017-12-29 ENCOUNTER — Emergency Department (HOSPITAL_COMMUNITY): Payer: Medicare Other

## 2017-12-29 DIAGNOSIS — N183 Chronic kidney disease, stage 3 (moderate): Secondary | ICD-10-CM | POA: Insufficient documentation

## 2017-12-29 DIAGNOSIS — Z7902 Long term (current) use of antithrombotics/antiplatelets: Secondary | ICD-10-CM | POA: Insufficient documentation

## 2017-12-29 DIAGNOSIS — D649 Anemia, unspecified: Secondary | ICD-10-CM | POA: Insufficient documentation

## 2017-12-29 DIAGNOSIS — I5032 Chronic diastolic (congestive) heart failure: Secondary | ICD-10-CM | POA: Diagnosis not present

## 2017-12-29 DIAGNOSIS — Z87891 Personal history of nicotine dependence: Secondary | ICD-10-CM | POA: Insufficient documentation

## 2017-12-29 DIAGNOSIS — E039 Hypothyroidism, unspecified: Secondary | ICD-10-CM | POA: Insufficient documentation

## 2017-12-29 DIAGNOSIS — Z794 Long term (current) use of insulin: Secondary | ICD-10-CM | POA: Diagnosis not present

## 2017-12-29 DIAGNOSIS — Z951 Presence of aortocoronary bypass graft: Secondary | ICD-10-CM | POA: Diagnosis not present

## 2017-12-29 DIAGNOSIS — Z7982 Long term (current) use of aspirin: Secondary | ICD-10-CM | POA: Diagnosis not present

## 2017-12-29 DIAGNOSIS — I209 Angina pectoris, unspecified: Secondary | ICD-10-CM | POA: Diagnosis not present

## 2017-12-29 DIAGNOSIS — Z950 Presence of cardiac pacemaker: Secondary | ICD-10-CM | POA: Insufficient documentation

## 2017-12-29 DIAGNOSIS — Z79899 Other long term (current) drug therapy: Secondary | ICD-10-CM | POA: Insufficient documentation

## 2017-12-29 DIAGNOSIS — G4489 Other headache syndrome: Secondary | ICD-10-CM | POA: Diagnosis not present

## 2017-12-29 DIAGNOSIS — R079 Chest pain, unspecified: Secondary | ICD-10-CM | POA: Diagnosis not present

## 2017-12-29 DIAGNOSIS — E1122 Type 2 diabetes mellitus with diabetic chronic kidney disease: Secondary | ICD-10-CM | POA: Diagnosis not present

## 2017-12-29 DIAGNOSIS — R000 Tachycardia, unspecified: Secondary | ICD-10-CM | POA: Diagnosis not present

## 2017-12-29 DIAGNOSIS — I10 Essential (primary) hypertension: Secondary | ICD-10-CM | POA: Diagnosis not present

## 2017-12-29 DIAGNOSIS — R0789 Other chest pain: Secondary | ICD-10-CM | POA: Diagnosis not present

## 2017-12-29 DIAGNOSIS — I130 Hypertensive heart and chronic kidney disease with heart failure and stage 1 through stage 4 chronic kidney disease, or unspecified chronic kidney disease: Secondary | ICD-10-CM | POA: Insufficient documentation

## 2017-12-29 LAB — CBC WITH DIFFERENTIAL/PLATELET
Abs Immature Granulocytes: 0 10*3/uL (ref 0.0–0.1)
Basophils Absolute: 0.1 10*3/uL (ref 0.0–0.1)
Basophils Relative: 1 %
EOS ABS: 0.3 10*3/uL (ref 0.0–0.7)
EOS PCT: 2 %
HEMATOCRIT: 37.5 % (ref 36.0–46.0)
HEMOGLOBIN: 11.7 g/dL — AB (ref 12.0–15.0)
Immature Granulocytes: 0 %
LYMPHS PCT: 16 %
Lymphs Abs: 1.8 10*3/uL (ref 0.7–4.0)
MCH: 30.2 pg (ref 26.0–34.0)
MCHC: 31.2 g/dL (ref 30.0–36.0)
MCV: 96.6 fL (ref 78.0–100.0)
MONOS PCT: 8 %
Monocytes Absolute: 0.9 10*3/uL (ref 0.1–1.0)
Neutro Abs: 7.9 10*3/uL — ABNORMAL HIGH (ref 1.7–7.7)
Neutrophils Relative %: 73 %
Platelets: 205 10*3/uL (ref 150–400)
RBC: 3.88 MIL/uL (ref 3.87–5.11)
RDW: 12.7 % (ref 11.5–15.5)
WBC: 10.9 10*3/uL — ABNORMAL HIGH (ref 4.0–10.5)

## 2017-12-29 LAB — BASIC METABOLIC PANEL
Anion gap: 15 (ref 5–15)
BUN: 23 mg/dL (ref 8–23)
CHLORIDE: 104 mmol/L (ref 98–111)
CO2: 20 mmol/L — ABNORMAL LOW (ref 22–32)
CREATININE: 1 mg/dL (ref 0.44–1.00)
Calcium: 8.8 mg/dL — ABNORMAL LOW (ref 8.9–10.3)
GFR calc Af Amer: 59 mL/min — ABNORMAL LOW (ref >60)
GFR calc non Af Amer: 51 mL/min — ABNORMAL LOW (ref >60)
GLUCOSE: 143 mg/dL — AB (ref 70–99)
Potassium: 3.7 mmol/L (ref 3.5–5.1)
Sodium: 139 mmol/L (ref 135–145)

## 2017-12-29 LAB — I-STAT TROPONIN, ED
Troponin i, poc: 0.01 ng/mL (ref 0.00–0.08)
Troponin i, poc: 0.01 ng/mL (ref 0.00–0.08)

## 2017-12-29 NOTE — ED Provider Notes (Signed)
[facility] EMERGENCY DEPARTMENT Provider Note   CSN: [id number] Arrival date & time: 12/29/17  0205     History   Chief Complaint Chief Complaint  Patient presents with  . Chest Pain    HPI [name] is a 84 y.o. female.  The history is provided by the patient.  She has history of coronary artery disease status post coronary artery bypass, chronic diastolic heart failure, chronic kidney disease, hypertension, hyperlipidemia, diabetes, permanent pacemaker and comes in having had 2 episodes of chest pain tonight.  She had an episode midsternal chest pain radiating to the left arm.  She describes it as an achy pain.  There was no associated dyspnea or diaphoresis.  She did have some nausea, but she actually had nausea earlier in the day before she had the chest discomfort.  She took a dose of nitroglycerin with relief of the discomfort and she went to sleep.  She woke up and had recurrence of pain.  Pain did not wake her up.  She called for an ambulance who gave her aspirin, but did not give her nitroglycerin because of no IV access.  Pain resolved spontaneously.  She states that she does have chest pain periodically and last episode of chest pain was about 2 weeks ago.  Past Medical History:  Diagnosis Date  . Allergic rhinitis   . Anemia   . Anxiety   . Barrett esophagus   . CAD (coronary artery disease) 2009   a. Multivessel s/p PCI w/DES 2009 // b. s/p CABG 2011  //  c. LHC 8/15: pLAD 95 ISR, LCx 100, pOM1 40, dRCA 100, S-OM1/OM2 ok, S-D1 ok, S-PDA ok, L-LAD ok, EF 60%  . Carotid artery disease ([place])    a. Carotid US 7/82: RICA 9-56%; LICA 21-30% >> FU 1 year  //  b. Carotid US 9/17: R 1-39%, L 40-59% >> FU 1 year  . Chronic diastolic heart failure ([place])   . CKD (chronic kidney disease), stage II    GFR 60-89 ml/min  . Depression   . Disc disease, degenerative, cervical   . Diverticulosis   . Gastroparesis   . GERD (gastroesophageal reflux disease)     . Gout   . H/O hiatal hernia   . Helicobacter pylori gastritis   . History of echocardiogram    a. Echo 11/13: EF 55% to 60%. Grade 2 diastolic dysfunction, MAC, trivial MR, mild LAE, normal RVSF, mild RAE, PASP 39 mmHg  //  b. Echo 4/17: EF 55-60%, normal wall motion, trivial AI, MAC, moderate LAE, mild RVE, PASP 35 mmHg  . History of thrombocytopenia   . HTN (hypertension)   . Hyperlipidemia   . Hypothyroidism   . LBP (low back pain)    Lumbar disc disease/lumbar spinal stenosis  . Morbid obesity ([place])   . Myocardial infarction ([place])   . Osteoarthritis   . Osteopenia   . PVD (peripheral vascular disease) ([place])   . Sick sinus syndrome [facility])    MDT Dual-chamber PPM implant 02/2012  . Type II or unspecified type diabetes mellitus without mention of complication, not stated as uncontrolled     Patient Active Problem List   Diagnosis Date Noted  . Heel spur, left 09/13/2017  . CKD (chronic kidney disease), stage III ([place]) 12/04/2016  . Chest pain 11/23/2015  . Unstable angina ([place]) 11/29/2013  . Type I (juvenile type) diabetes mellitus with renal manifestations, not stated as uncontrolled(250.41) 04/27/2012  . DM (diabetes mellitus),  type 2 with renal complications ([place]) 16/01/9603  . AKI (acute kidney injury) ([place]) 03/05/2012  . Diarrhea 03/05/2012  . Carotid artery disease ([place]) 08/11/2010  . Left shoulder pain 07/08/2010  . Preventative health care 07/08/2010  . HOARSENESS 06/04/2010  . PRURITUS 05/11/2010  . ANEMIA-NOS 04/02/2010  . Chronic diastolic heart failure ([place]) 02/11/2010  . Acute on chronic diastolic heart failure ([place]) 11/26/2009  . SINUS BRADYCARDIA 10/30/2009  . ALLERGIC RHINITIS 09/11/2009  . BACK PAIN 09/11/2009  . MUSCLE STRAIN, RIGHT BUTTOCK 09/11/2009  . SHINGLES 05/11/2009  . SHOULDER PAIN, LEFT 12/29/2008  . Proteinuria 12/12/2008  . Abdominal pain, unspecified site 09/24/2007  . Coronary atherosclerosis 05/03/2007  . CHEST PAIN 04/20/2007  .  DIZZINESS 02/28/2007  . ANXIETY 02/15/2007  . GERD 02/15/2007  . Gastroparesis 02/15/2007  . [place] DISEASE, CERVICAL 02/15/2007  . [place] DISEASE, LUMBAR 02/15/2007  . SPINAL STENOSIS, LUMBAR 02/15/2007  . PERIPHERAL EDEMA 02/15/2007  . Personal History of Other Diseases of Digestive Disease 02/15/2007  . HYPERLIPIDEMIA 01/01/2007  . GOUT 01/01/2007  . Morbid obesity ([place]) 01/01/2007  . DEPRESSION 01/01/2007  . PERIPHERAL VASCULAR DISEASE 01/01/2007  . DIVERTICULOSIS, COLON 01/01/2007  . OSTEOARTHRITIS 01/01/2007  . LOW BACK PAIN 01/01/2007  . OSTEOPENIA 01/01/2007  . Essential hypertension 10/26/2006    Past Surgical History:  Procedure Laterality Date  . ABDOMINAL HYSTERECTOMY    . CARDIAC CATHETERIZATION     2011  DR COOPER (APPT NEXT WEEK)  . CHOLECYSTECTOMY    . CORONARY ARTERY BYPASS GRAFT  2011   LIMA-LAD, SVG-DIAG, SVG-OM1-OM2, SVG-PDA  . CORONARY STENT PLACEMENT     Drug-eluting stent to the left anterior descending, circumflex and right coronary artery in Jan 2009  . EYE SURGERY     BIL CATARACT REMOVAL 06/2010  . LEFT HEART CATHETERIZATION WITH CORONARY ANGIOGRAM N/A 12/02/2013   Procedure: LEFT HEART CATHETERIZATION WITH CORONARY ANGIOGRAM;  Surgeon: Sinclair Grooms, MD;  Location: [facility] CATH LAB;  Service: Cardiovascular;  Laterality: N/A;  . OVARIAN CYST REMOVAL    . PACEMAKER INSERTION  03/06/12   MDT Adapta L implanted by Dr Rayann Heman for SSS  . PERMANENT PACEMAKER INSERTION N/A 03/06/2012   Procedure: PERMANENT PACEMAKER INSERTION;  Surgeon: Thompson Grayer, MD;  Location: [facility] CATH LAB;  Service: Cardiovascular;  Laterality: N/A;  . SHOULDER ARTHROSCOPY  06/16/2011   Procedure: ARTHROSCOPY SHOULDER;  Surgeon: Sharmon Revere, MD;  Location: [facility];  Service: Orthopedics;  Laterality: Left;  LEFT SHOULDER ARTHROSCOPY ACROMIALPLASTY, POSSIBLE MINI OPEN CUFF REPAIR   . TUBAL LIGATION       OB History   None      Home Medications    Prior to Admission medications     Medication Sig Start Date End Date Taking? Authorizing Provider  allopurinol (ZYLOPRIM) 100 MG tablet Take 100 mg by mouth daily.    [provider]  aspirin 81 MG tablet Take 81 mg by mouth daily.    [provider]  atorvastatin (LIPITOR) 20 MG tablet TAKE 1 TABLET BY MOUTH  DAILY 06/09/17   Sherren Mocha, MD  azelastine (OPTIVAR) 0.05 % ophthalmic solution Place 1 drop into both eyes 2 (two) times daily. 10/26/15   [provider]  carvedilol (COREG) 3.125 MG tablet TAKE 1 TABLET BY MOUTH TWO  TIMES DAILY 04/07/17   Sherren Mocha, MD  clopidogrel (PLAVIX) 75 MG tablet TAKE 1 TABLET BY MOUTH  DAILY 10/27/17   Sherren Mocha, MD  colchicine 0.6 MG tablet Take 0.6  mg by mouth daily as needed (gout).     [provider]  diclofenac sodium (VOLTAREN) 1 % GEL Apply 2 g topically 4 (four) times daily.    [provider]  diclofenac sodium (VOLTAREN) 1 % GEL Apply 2 g topically 2 (two) times daily. Rub into affected area of foot 2 to 4 times daily 09/12/17   Trula Slade, DPM  ENTRESTO 97-103 MG Take 1 tablet by mouth 2 (two) times daily. 05/05/17   [provider]  HUMULIN N 100 UNIT/ML injection Inject 10-45 Units into the skin 2 (two) times daily before a meal. 45 units in the morning and 10 units at bedtime 07/06/15   [provider]  levothyroxine (SYNTHROID, LEVOTHROID) 100 MCG tablet Take 1 tablet (100 mcg total) by mouth daily. 03/13/12   Renato Shin, MD  NITROSTAT 0.4 MG SL tablet DISSOLVE ONE TABLET UNDER THE TONGUE EVERY 5 MINUTES AS NEEDED FOR CHEST PAIN.  DO NOT EXCEED A TOTAL OF 3 DOSES IN 15 MINUTES 12/04/15   Sherren Mocha, MD  ONE TOUCH ULTRA TEST test strip 1 each by Other route daily as needed (blood sugar).  07/10/13   [provider]  oxyCODONE-acetaminophen (PERCOCET/ROXICET) 5-325 MG tablet Take 1 tablet by mouth daily as needed. 04/24/17   [provider]  pioglitazone (ACTOS) 15 MG tablet Take 15  mg by mouth daily.  07/06/15   [provider]    Family History Family History  Problem Relation Age of Onset  . Diabetes Mother   . Hypertension Mother   . Heart attack Mother   . Stroke Father   . Coronary artery disease Other     Social History Social History   Tobacco Use  . Smoking status: Former Research scientist (life sciences)  . Smokeless tobacco: Never Used  . Tobacco comment: quit 30 yrs ago  Substance Use Topics  . Alcohol use: No  . Drug use: No     Allergies   Ciprofloxacin; Codeine; Hydrocodone; Penicillins; Shellfish allergy; Diltiazem hcl; Sulfonamide derivatives; Lovastatin; and Metformin   Review of Systems Review of Systems  All other systems reviewed and are negative.    Physical Exam Updated Vital Signs BP (!) 123/107 (BP Location: Right Arm) Comment: patient moving arm  Pulse 70   Temp 97.9 F (36.6 C) (Oral)   Resp 18   SpO2 99%   Physical Exam  Nursing note and vitals reviewed.  84 year old female, resting comfortably and in no acute distress. Vital signs are significant for elevated diastolic blood pressure. Oxygen saturation is 99%, which is normal. Head is normocephalic and atraumatic. PERRLA, EOMI. Oropharynx is clear. Neck is nontender and supple without adenopathy or JVD. Back is nontender and there is no CVA tenderness. Lungs are clear without rales, wheezes, or rhonchi. Chest is moderately tender in the left parasternal area, but this does not reproduce her pain. Heart has regular rate and rhythm without murmur. Abdomen is soft, flat, nontender without masses or hepatosplenomegaly and peristalsis is normoactive. Extremities have 2+ edema, full range of motion is present. Skin is warm and dry without rash. Neurologic: Mental status is normal, cranial nerves are intact, there are no motor or sensory deficits.  ED Treatments / Results  Labs (all labs ordered are listed, but only abnormal results are displayed) Labs Reviewed  BASIC METABOLIC  PANEL - Abnormal; Notable for the following components:      Result Value   CO2 20 (*)    Glucose, Bld 143 (*)  Calcium 8.8 (*)    GFR calc non Af Amer 51 (*)    GFR calc Af Amer 59 (*)    All other components within normal limits  CBC WITH DIFFERENTIAL/PLATELET - Abnormal; Notable for the following components:   WBC 10.9 (*)    Hemoglobin 11.7 (*)    Neutro Abs 7.9 (*)    All other components within normal limits  I-STAT TROPONIN, ED  I-STAT TROPONIN, ED  I-STAT TROPONIN, ED    EKG EKG Interpretation  Date/Time:  Friday December 29 2017 02:17:37 EDT Ventricular Rate:  159 PR Interval:    QRS Duration: 209 QT Interval:  495 QTC Calculation: 806 R Axis:   -73 Text Interpretation:  AV dual-paced rhythm When compared with ECG of 12/03/2016, No significant change was found Confirmed by Delora Fuel ([id number]) on 12/29/2017 2:21:48 AM   Radiology Dg Chest 2 View  Result Date: 12/29/2017 CLINICAL DATA:  Chest pain earlier in the evening. Relieved with nitroglycerin. Now recurrent left chest pain radiating to the arm. Nausea. EXAM: CHEST - 2 VIEW COMPARISON:  Old 09/28/2016 FINDINGS: Cardiac pacemaker. Postoperative changes in the mediastinum. Shallow inspiration. Heart size and pulmonary vascularity are normal. No blunting of costophrenic angles. No pneumothorax. Calcified and tortuous aorta. Degenerative changes in the spine. IMPRESSION: No active cardiopulmonary disease. Electronically Signed   By: Lucienne Capers M.D.   On: 12/29/2017 02:55    Procedures Procedures   Medications Ordered in ED Medications - No data to display   Initial Impression / Assessment and Plan / ED Course  I have reviewed the triage vital signs and the nursing notes.  Pertinent labs & imaging results that were available during my care of the patient were reviewed by me and considered in my medical decision making (see chart for details).  Chest pain which most likely represents angina pectoris.   ECG shows AV sequential pacemaker, unable to assess ST or T changes because of paced rhythm.  She is currently pain-free.  Will check troponin and, if negative, check delta troponin.  If delta troponin is also negative, I feel she can safely follow-up with her cardiologist.  Old records are reviewed showing numerous visits for pacemaker checks.  3:21 AM Chest x-ray is unremarkable.  Labs show normal troponin, mild anemia which is improved over baseline.  Delta troponin is pending.  6:36 AM Repeat troponin is normal.  She has not had any further episodes of chest pain.  She is felt to be safe for discharge.  Advised to follow-up with her cardiologist in the next week, return precautions discussed.  Final Clinical Impressions(s) / ED Diagnoses   Final diagnoses:  Angina pectoris (HCC)  Normochromic normocytic anemia    ED Discharge Orders    None       Delora Fuel, MD 85/92/92 [phone]

## 2017-12-29 NOTE — ED Triage Notes (Signed)
  Patient BIB [facility] for chest pain.  Patient had chest pain earlier in the evening and took a nitro tab at 2200 which relieved her pain.  Patient went back to sleep and woke up with left sided chest pain with it radiating to her arm. No SOB. Nausea with no emesis.  Patients family called EMS.  Patient has hx of previous MI and pacemaker.  Patient is A&O x4 and is pain free.  Patient was given 324 ASA in route but no nitro because she refused IV access.

## 2017-12-29 NOTE — Discharge Instructions (Addendum)
If you ever have chest pain that does not get better with three nitroglycerin tablets, come to the hospital IMMEDIATELY!

## 2018-01-01 ENCOUNTER — Telehealth: Payer: Self-pay

## 2018-01-01 NOTE — Telephone Encounter (Signed)
Pt aware. Pt has an appt for tomorrow with Arida at 10:20

## 2018-01-02 ENCOUNTER — Ambulatory Visit: Payer: Medicare Other | Admitting: Cardiovascular Disease

## 2018-01-15 ENCOUNTER — Encounter: Payer: Self-pay | Admitting: Cardiology

## 2018-01-15 ENCOUNTER — Ambulatory Visit: Payer: Medicare Other | Admitting: Cardiology

## 2018-01-15 ENCOUNTER — Telehealth: Payer: Self-pay | Admitting: Cardiology

## 2018-01-15 ENCOUNTER — Ambulatory Visit (INDEPENDENT_AMBULATORY_CARE_PROVIDER_SITE_OTHER): Payer: Medicare Other | Admitting: *Deleted

## 2018-01-15 DIAGNOSIS — I495 Sick sinus syndrome: Secondary | ICD-10-CM

## 2018-01-15 DIAGNOSIS — R002 Palpitations: Secondary | ICD-10-CM | POA: Insufficient documentation

## 2018-01-15 DIAGNOSIS — E118 Type 2 diabetes mellitus with unspecified complications: Secondary | ICD-10-CM

## 2018-01-15 DIAGNOSIS — Z794 Long term (current) use of insulin: Secondary | ICD-10-CM

## 2018-01-15 DIAGNOSIS — IMO0001 Reserved for inherently not codable concepts without codable children: Secondary | ICD-10-CM

## 2018-01-15 DIAGNOSIS — Z951 Presence of aortocoronary bypass graft: Secondary | ICD-10-CM

## 2018-01-15 DIAGNOSIS — Z950 Presence of cardiac pacemaker: Secondary | ICD-10-CM | POA: Diagnosis not present

## 2018-01-15 MED ORDER — CARVEDILOL 6.25 MG PO TABS: 6.2500 mg | ORAL_TABLET | Freq: Two times a day (BID) | ORAL | 1 refills | Status: DC

## 2018-01-15 NOTE — Assessment & Plan Note (Signed)
CABG 2011 x 3 with SVG-Dx1, SVG-PDA, LIMA-LAD, cath Aug 2015-widley patent grafts, Myoview Aug 2017 low risk with normal LVF

## 2018-01-15 NOTE — Assessment & Plan Note (Signed)
The episode that prompted her ED visit sounded more like palpitations than angina. Her troponin were negative. I could easily identify an arrhythmia on her pacer check. I did suggest we increase her Coreg to 6.25 mg BID and see how she does.

## 2018-01-15 NOTE — Progress Notes (Signed)
01/15/2018 [name]   22-Jun-1932  [id number]  Primary Physician Lucianne Lei, MD Primary Cardiologist: Dr Burt Knack                                    Dr Allred                                    Dr Fletcher Anon  HPI:  Pleasant 84 y/o AA female followed by Dr Burt Knack with a history of CABG in 2011, cath Aug 2015, and low risk Myoview Aug 2017. She has a MDT pacemaker in place. Other problems include diastolic CHF, IDDM, ZHG-9-9, and HLD.  She went to the ED 12/29/17 with what she called "chest pain" then but what she described to me as "flutter in my chest". She was kept overnight. Troponin was negative x 2. She was set up for this office visit to follow up. She went to the [facility] office by mistake, and is now seen at the [place] office. Her daughter who is deaf accompanied her. The pt is in a wheelchair. She denies any angina. She has occasional "fluttering" that last seconds. I reviewed her pacemaker download from today. I could identify any abvious arrhythmia.    Current Outpatient Medications  Medication Sig Dispense Refill  . allopurinol (ZYLOPRIM) 100 MG tablet Take 100 mg by mouth daily.    Marland Kitchen aspirin 81 MG tablet Take 81 mg by mouth daily.    Marland Kitchen atorvastatin (LIPITOR) 20 MG tablet TAKE 1 TABLET BY MOUTH  DAILY 90 tablet 3  . azelastine (OPTIVAR) 0.05 % ophthalmic solution Place 1 drop into both eyes 2 (two) times daily.    . clopidogrel (PLAVIX) 75 MG tablet TAKE 1 TABLET BY MOUTH  DAILY 90 tablet 2  . colchicine 0.6 MG tablet Take 0.6 mg by mouth daily as needed (gout).     Marland Kitchen diclofenac sodium (VOLTAREN) 1 % GEL Apply 2 g topically 4 (four) times daily.    . diclofenac sodium (VOLTAREN) 1 % GEL Apply 2 g topically 2 (two) times daily. Rub into affected area of foot 2 to 4 times daily 100 g 0  . ENTRESTO 97-103 MG Take 1 tablet by mouth 2 (two) times daily.    Marland Kitchen HUMULIN N 100 UNIT/ML injection Inject 10-45 Units into the skin 2 (two) times daily before a meal. 45 units in the morning  and 10 units at bedtime    . levothyroxine (SYNTHROID, LEVOTHROID) 100 MCG tablet Take 1 tablet (100 mcg total) by mouth daily. 30 tablet 0  . NITROSTAT 0.4 MG SL tablet DISSOLVE ONE TABLET UNDER THE TONGUE EVERY 5 MINUTES AS NEEDED FOR CHEST PAIN.  DO NOT EXCEED A TOTAL OF 3 DOSES IN 15 MINUTES 25 tablet 3  . ONE TOUCH ULTRA TEST test strip 1 each by Other route daily as needed (blood sugar).     Marland Kitchen oxyCODONE-acetaminophen (PERCOCET/ROXICET) 5-325 MG tablet Take 1 tablet by mouth daily as needed.    . pioglitazone (ACTOS) 15 MG tablet Take 15 mg by mouth daily.     . carvedilol (COREG) 6.25 MG tablet Take 1 tablet (6.25 mg total) by mouth 2 (two) times daily. 180 tablet 1   Current Facility-Administered Medications  Medication Dose Route Frequency Provider Last Rate Last Dose  . betamethasone acetate-betamethasone sodium phosphate (  CELESTONE) injection 3 mg  3 mg Intramuscular Once Edrick Kins, DPM        Allergies  Allergen Reactions  . Ciprofloxacin Nausea And Vomiting    syncope  . Codeine Other (See Comments)    HALLUCINATIONS  . Hydrocodone Other (See Comments)    Makes her pass out  . Penicillins Hives  . Shellfish Allergy Hives  . Diltiazem Hcl Other (See Comments)    : low heart rate  . Sulfonamide Derivatives Nausea And Vomiting  . Lovastatin Other (See Comments)    Pt doesn't remember a reaction  . Metformin Other (See Comments)    diarrhea    Past Medical History:  Diagnosis Date  . Allergic rhinitis   . Anemia   . Anxiety   . Barrett esophagus   . CAD (coronary artery disease) 2009   a. Multivessel s/p PCI w/DES 2009 // b. s/p CABG 2011  //  c. LHC 8/15: pLAD 95 ISR, LCx 100, pOM1 40, dRCA 100, S-OM1/OM2 ok, S-D1 ok, S-PDA ok, L-LAD ok, EF 60%  . Carotid artery disease ([place])    a. Carotid US 4/09: RICA 8-11%; LICA 91-47% >> FU 1 year  //  b. Carotid US 9/17: R 1-39%, L 40-59% >> FU 1 year  . Chronic diastolic heart failure ([place])   . CKD (chronic kidney  disease), stage II    GFR 60-89 ml/min  . Depression   . Disc disease, degenerative, cervical   . Diverticulosis   . Gastroparesis   . GERD (gastroesophageal reflux disease)   . Gout   . H/O hiatal hernia   . Helicobacter pylori gastritis   . History of echocardiogram    a. Echo 11/13: EF 55% to 60%. Grade 2 diastolic dysfunction, MAC, trivial MR, mild LAE, normal RVSF, mild RAE, PASP 39 mmHg  //  b. Echo 4/17: EF 55-60%, normal wall motion, trivial AI, MAC, moderate LAE, mild RVE, PASP 35 mmHg  . History of thrombocytopenia   . HTN (hypertension)   . Hyperlipidemia   . Hypothyroidism   . LBP (low back pain)    Lumbar disc disease/lumbar spinal stenosis  . Morbid obesity ([place])   . Myocardial infarction ([place])   . Osteoarthritis   . Osteopenia   . PVD (peripheral vascular disease) ([place])   . Sick sinus syndrome [facility])    MDT Dual-chamber PPM implant 02/2012  . Type II or unspecified type diabetes mellitus without mention of complication, not stated as uncontrolled     Social History   Socioeconomic History  . Marital status: Widowed    Spouse name: Not on file  . Number of children: Not on file  . Years of education: Not on file  . Highest education level: Not on file  Occupational History  . Occupation: RETIRED LPN  Social Needs  . Financial resource strain: Not on file  . Food insecurity:    Worry: Not on file    Inability: Not on file  . Transportation needs:    Medical: Not on file    Non-medical: Not on file  Tobacco Use  . Smoking status: Former Research scientist (life sciences)  . Smokeless tobacco: Never Used  . Tobacco comment: quit 30 yrs ago  Substance and Sexual Activity  . Alcohol use: No  . Drug use: No  . Sexual activity: Not on file  Lifestyle  . Physical activity:    Days per week: Not on file    Minutes per session: Not on file  .  Stress: Not on file  Relationships  . Social connections:    Talks on phone: Not on file    Gets together: Not on file    Attends  religious service: Not on file    Active member of club or organization: Not on file    Attends meetings of clubs or organizations: Not on file    Relationship status: Not on file  . Intimate partner violence:    Fear of current or ex partner: Not on file    Emotionally abused: Not on file    Physically abused: Not on file    Forced sexual activity: Not on file  Other Topics Concern  . Not on file  Social History Narrative   Widowed 2004.., Lives alone..Family history is negative for premature coronary artery disease. Mother died at age 68 with heart disease in her later years, father died at age 61 from a stroke.Marland KitchenShe  has 8 siblings, none of whom have coronary artery disease.           Family History  Problem Relation Age of Onset  . Diabetes Mother   . Hypertension Mother   . Heart attack Mother   . Stroke Father   . Coronary artery disease Other      Review of Systems: General: negative for chills, fever, night sweats or weight changes.  Cardiovascular: negative for chest pain, dyspnea on exertion, edema, orthopnea, palpitations, paroxysmal nocturnal dyspnea or shortness of breath Dermatological: negative for rash Respiratory: negative for cough or wheezing Urologic: negative for hematuria Abdominal: negative for nausea, vomiting, diarrhea, bright red blood per rectum, melena, or hematemesis Neurologic: negative for visual changes, syncope, or dizziness All other systems reviewed and are otherwise negative except as noted above.    Blood pressure 130/70, pulse 82, height _0  (1.702 m), weight 218 lb (98.9 kg), SpO2 97 %.  General appearance: alert, cooperative, no distress, moderately obese and sitting in her rolling chair walker Neck: no carotid bruit and no JVD Lungs: clear to auscultation bilaterally Heart: regular rate and rhythm Extremities: no edema Skin: Skin color, texture, turgor normal. No rashes or lesions Neurologic: Grossly normal  EKG  Paced  ASSESSMENT AND PLAN:   Palpitations The episode that prompted her ED visit sounded more like palpitations than angina. Her troponin were negative. I could easily identify an arrhythmia on her pacer check. I did suggest we increase her Coreg to 6.25 mg BID and see how she does.  CABG 2011 CABG 2011 x 3 with SVG-Dx1, SVG-PDA, LIMA-LAD, cath Aug 2015-widley patent grafts, Myoview Aug 2017 low risk with normal LVF  Pacemaker MDT pacemaker implanted 2013 for SSS. Followed by Dr Rayann Heman.   Renal insufficiency Stage 2-3 CRI    PLAN  Increase Coreg to 6.25 mg BID- observe.   Kerin Ransom PA-C 01/15/2018 4:28 PM

## 2018-01-15 NOTE — Patient Instructions (Signed)
Medication Instructions:   Increase Carvedilol to 6.25 mg twice daily. OK to take 2 of the 3.125 mg tablets until those run out.   If you need a refill on your cardiac medications before your next appointment, please call your pharmacy.   Lab work: NONE If you have labs (blood work) drawn today and your tests are completely normal, you will receive your results only by: Marland Kitchen MyChart Message (if you have MyChart) OR . A paper copy in the mail If you have any lab test that is abnormal or we need to change your treatment, we will call you to review the results.  Testing/Procedures: NONE  Follow-Up: At [facility], you and your health needs are our priority.  As part of our continuing mission to provide you with exceptional heart care, we have created designated Provider Care Teams.  These Care Teams include your primary Cardiologist (physician) and Advanced Practice Providers (APPs -  Physician Assistants and Nurse Practitioners) who all work together to provide you with the care you need, when you need it. You will need a follow up appointment in:  3 months with Dr. Excell Seltzer.   Or one of the following Advanced Practice Providers on your designated Care Team: Tereso Newcomer, PA-C [place], [place] . Berton Bon, NP  Any Other Special Instructions Will Be Listed Below (If Applicable). Please call if you have anymore episodes. Our number is [phone].

## 2018-01-15 NOTE — Telephone Encounter (Signed)
Spoke with pt and reminded pt of remote transmission that is due today. Pt verbalized understanding.   

## 2018-01-15 NOTE — Assessment & Plan Note (Signed)
MDT pacemaker implanted 2013 for SSS. Followed by Dr Johney Frame.

## 2018-01-15 NOTE — Assessment & Plan Note (Signed)
Renal insufficiency

## 2018-01-16 NOTE — Progress Notes (Signed)
Remote pacemaker transmission.   

## 2018-01-24 ENCOUNTER — Encounter: Payer: Self-pay | Admitting: Cardiology

## 2018-01-29 ENCOUNTER — Ambulatory Visit (HOSPITAL_COMMUNITY)
Admission: RE | Admit: 2018-01-29 | Discharge: 2018-01-29 | Disposition: A | Discharge status: Home / Self Care | Payer: Medicare Other | Source: Ambulatory Visit | Attending: Cardiology | Admitting: Cardiology

## 2018-01-29 DIAGNOSIS — I6523 Occlusion and stenosis of bilateral carotid arteries: Secondary | ICD-10-CM | POA: Diagnosis not present

## 2018-02-04 ENCOUNTER — Other Ambulatory Visit: Payer: Self-pay

## 2018-02-04 ENCOUNTER — Encounter (HOSPITAL_COMMUNITY): Payer: Self-pay | Admitting: Emergency Medicine

## 2018-02-04 ENCOUNTER — Emergency Department (HOSPITAL_COMMUNITY)
Admission: EM | Admit: 2018-02-04 | Discharge: 2018-02-04 | Disposition: A | Discharge status: Home / Self Care | Payer: Medicare Other | Attending: Emergency Medicine | Admitting: Emergency Medicine

## 2018-02-04 ENCOUNTER — Emergency Department (HOSPITAL_COMMUNITY): Payer: Medicare Other

## 2018-02-04 DIAGNOSIS — I2510 Atherosclerotic heart disease of native coronary artery without angina pectoris: Secondary | ICD-10-CM | POA: Insufficient documentation

## 2018-02-04 DIAGNOSIS — J189 Pneumonia, unspecified organism: Secondary | ICD-10-CM | POA: Diagnosis not present

## 2018-02-04 DIAGNOSIS — N183 Chronic kidney disease, stage 3 (moderate): Secondary | ICD-10-CM | POA: Insufficient documentation

## 2018-02-04 DIAGNOSIS — I130 Hypertensive heart and chronic kidney disease with heart failure and stage 1 through stage 4 chronic kidney disease, or unspecified chronic kidney disease: Secondary | ICD-10-CM | POA: Diagnosis not present

## 2018-02-04 DIAGNOSIS — I5032 Chronic diastolic (congestive) heart failure: Secondary | ICD-10-CM | POA: Insufficient documentation

## 2018-02-04 DIAGNOSIS — Z7982 Long term (current) use of aspirin: Secondary | ICD-10-CM | POA: Insufficient documentation

## 2018-02-04 DIAGNOSIS — R0902 Hypoxemia: Secondary | ICD-10-CM | POA: Diagnosis not present

## 2018-02-04 DIAGNOSIS — Z79899 Other long term (current) drug therapy: Secondary | ICD-10-CM | POA: Diagnosis not present

## 2018-02-04 DIAGNOSIS — N3000 Acute cystitis without hematuria: Secondary | ICD-10-CM

## 2018-02-04 DIAGNOSIS — Z794 Long term (current) use of insulin: Secondary | ICD-10-CM | POA: Insufficient documentation

## 2018-02-04 DIAGNOSIS — Z87891 Personal history of nicotine dependence: Secondary | ICD-10-CM | POA: Insufficient documentation

## 2018-02-04 DIAGNOSIS — J181 Lobar pneumonia, unspecified organism: Secondary | ICD-10-CM | POA: Insufficient documentation

## 2018-02-04 DIAGNOSIS — E1022 Type 1 diabetes mellitus with diabetic chronic kidney disease: Secondary | ICD-10-CM | POA: Insufficient documentation

## 2018-02-04 DIAGNOSIS — R079 Chest pain, unspecified: Secondary | ICD-10-CM | POA: Diagnosis present

## 2018-02-04 DIAGNOSIS — Z950 Presence of cardiac pacemaker: Secondary | ICD-10-CM | POA: Insufficient documentation

## 2018-02-04 DIAGNOSIS — Z7902 Long term (current) use of antithrombotics/antiplatelets: Secondary | ICD-10-CM | POA: Insufficient documentation

## 2018-02-04 DIAGNOSIS — M79601 Pain in right arm: Secondary | ICD-10-CM | POA: Diagnosis not present

## 2018-02-04 LAB — URINALYSIS, ROUTINE W REFLEX MICROSCOPIC
BACTERIA UA: NONE SEEN
BILIRUBIN URINE: NEGATIVE
Glucose, UA: NEGATIVE mg/dL
KETONES UR: NEGATIVE mg/dL
NITRITE: NEGATIVE
PROTEIN: 100 mg/dL — AB
SPECIFIC GRAVITY, URINE: 1.018 (ref 1.005–1.030)
pH: 5 (ref 5.0–8.0)

## 2018-02-04 LAB — BASIC METABOLIC PANEL
Anion gap: 8 (ref 5–15)
BUN: 25 mg/dL — AB (ref 8–23)
CHLORIDE: 110 mmol/L (ref 98–111)
CO2: 22 mmol/L (ref 22–32)
CREATININE: 0.98 mg/dL (ref 0.44–1.00)
Calcium: 9.1 mg/dL (ref 8.9–10.3)
GFR calc Af Amer: >60 mL/min (ref >60)
GFR calc non Af Amer: 52 mL/min — ABNORMAL LOW (ref >60)
Glucose, Bld: 174 mg/dL — ABNORMAL HIGH (ref 70–99)
Potassium: 3.8 mmol/L (ref 3.5–5.1)
SODIUM: 140 mmol/L (ref 135–145)

## 2018-02-04 LAB — I-STAT TROPONIN, ED
Troponin i, poc: 0.01 ng/mL (ref 0.00–0.08)
Troponin i, poc: 0 ng/mL (ref 0.00–0.08)

## 2018-02-04 LAB — CBC
HEMATOCRIT: 37.8 % (ref 36.0–46.0)
Hemoglobin: 11.5 g/dL — ABNORMAL LOW (ref 12.0–15.0)
MCH: 29.2 pg (ref 26.0–34.0)
MCHC: 30.4 g/dL (ref 30.0–36.0)
MCV: 95.9 fL (ref 80.0–100.0)
Platelets: 196 10*3/uL (ref 150–400)
RBC: 3.94 MIL/uL (ref 3.87–5.11)
RDW: 12.6 % (ref 11.5–15.5)
WBC: 12.1 10*3/uL — AB (ref 4.0–10.5)
nRBC: 0 % (ref 0.0–0.2)

## 2018-02-04 LAB — I-STAT CG4 LACTIC ACID, ED
LACTIC ACID, VENOUS: 0.69 mmol/L (ref 0.5–1.9)
Lactic Acid, Venous: 1.14 mmol/L (ref 0.5–1.9)

## 2018-02-04 LAB — CK: Total CK: 213 U/L (ref 38–234)

## 2018-02-04 MED ORDER — SODIUM CHLORIDE 0.9 % IV BOLUS
500.0000 mL | Freq: Once | INTRAVENOUS | Status: AC
  Administered 2018-02-04: 500 mL via INTRAVENOUS

## 2018-02-04 MED ORDER — ONDANSETRON HCL 4 MG/2ML IJ SOLN
4.0000 mg | Freq: Once | INTRAMUSCULAR | Status: AC
  Administered 2018-02-04: 4 mg via INTRAVENOUS
  Filled 2018-02-04: qty 2

## 2018-02-04 MED ORDER — AZITHROMYCIN 250 MG PO TABS: 250.0000 mg | ORAL_TABLET | Freq: Every day | ORAL | 0 refills | Status: DC

## 2018-02-04 MED ORDER — SODIUM CHLORIDE 0.9 % IV SOLN
1.0000 g | Freq: Once | INTRAVENOUS | Status: AC
  Administered 2018-02-04: 1 g via INTRAVENOUS
  Filled 2018-02-04: qty 10

## 2018-02-04 MED ORDER — MORPHINE SULFATE (PF) 4 MG/ML IV SOLN
4.0000 mg | Freq: Once | INTRAVENOUS | Status: DC
  Filled 2018-02-04: qty 1

## 2018-02-04 MED ORDER — ACETAMINOPHEN 325 MG PO TABS
650.0000 mg | ORAL_TABLET | Freq: Once | ORAL | Status: AC
  Administered 2018-02-04: 650 mg via ORAL
  Filled 2018-02-04: qty 2

## 2018-02-04 MED ORDER — CEPHALEXIN 500 MG PO CAPS: 500.0000 mg | ORAL_CAPSULE | Freq: Three times a day (TID) | ORAL | 0 refills | Status: DC

## 2018-02-04 NOTE — ED Notes (Signed)
Placed pt on bedpan, tolerated well. 

## 2018-02-04 NOTE — ED Triage Notes (Signed)
Pt. Stated, I started having chest pain yesterday and my rt. Arm hurts.

## 2018-02-04 NOTE — ED Notes (Signed)
Gave pt water, per RN 

## 2018-02-04 NOTE — ED Provider Notes (Signed)
[facility] Provider Note   CSN: [id number] Arrival date & time: 02/04/18  1418     History   Chief Complaint Chief Complaint  Patient presents with  . Chest Pain  . Arm Pain    HPI [name] is a 84 y.o. female history of CAD status post CABG, CKD, depression, Barrett's esophagus here presenting with chest pain, chills.  Vision states that she started having right arm pain and chest pain since yesterday.  She also has some chills and subjective fevers.  She denies any injury or trauma to the arm.  Still has some productive cough as well.  Denies any abdominal pain or urinary symptoms. Patient was seen here a month ago for chest pain and had normal troponins and saw cardiology for follow up already since the ED visit and no additional testing was planned.   The history is provided by the patient.    Past Medical History:  Diagnosis Date  . Allergic rhinitis   . Anemia   . Anxiety   . Barrett esophagus   . CAD (coronary artery disease) 2009   a. Multivessel s/p PCI w/DES 2009 // b. s/p CABG 2011  //  c. LHC 8/15: pLAD 95 ISR, LCx 100, pOM1 40, dRCA 100, S-OM1/OM2 ok, S-D1 ok, S-PDA ok, L-LAD ok, EF 60%  . Carotid artery disease ([place])    a. Carotid US 8/30: RICA 9-40%; LICA 76-80% >> FU 1 year  //  b. Carotid US 9/17: R 1-39%, L 40-59% >> FU 1 year  . Chronic diastolic heart failure ([place])   . CKD (chronic kidney disease), stage II    GFR 60-89 ml/min  . Depression   . Disc disease, degenerative, cervical   . Diverticulosis   . Gastroparesis   . GERD (gastroesophageal reflux disease)   . Gout   . H/O hiatal hernia   . Helicobacter pylori gastritis   . History of echocardiogram    a. Echo 11/13: EF 55% to 60%. Grade 2 diastolic dysfunction, MAC, trivial MR, mild LAE, normal RVSF, mild RAE, PASP 39 mmHg  //  b. Echo 4/17: EF 55-60%, normal wall motion, trivial AI, MAC, moderate LAE, mild RVE, PASP 35 mmHg  . History of  thrombocytopenia   . HTN (hypertension)   . Hyperlipidemia   . Hypothyroidism   . LBP (low back pain)    Lumbar disc disease/lumbar spinal stenosis  . Morbid obesity ([place])   . Myocardial infarction ([place])   . Osteoarthritis   . Osteopenia   . PVD (peripheral vascular disease) ([place])   . Sick sinus syndrome [facility])    MDT Dual-chamber PPM implant 02/2012  . Type II or unspecified type diabetes mellitus without mention of complication, not stated as uncontrolled     Patient Active Problem List   Diagnosis Date Noted  . Palpitations 01/15/2018  . Pacemaker 01/15/2018  . Heel spur, left 09/13/2017  . CKD (chronic kidney disease), stage III ([place]) 12/04/2016  . Chest pain 11/23/2015  . Unstable angina ([place]) 11/29/2013  . Type I (juvenile type) diabetes mellitus with renal manifestations, not stated as uncontrolled(250.41) 04/27/2012  . Insulin dependent diabetes mellitus with complications ([place]) 88/02/314  . AKI (acute kidney injury) ([place]) 03/05/2012  . Diarrhea 03/05/2012  . Carotid artery disease ([place]) 08/11/2010  . Left shoulder pain 07/08/2010  . Preventative health care 07/08/2010  . HOARSENESS 06/04/2010  . PRURITUS 05/11/2010  . ANEMIA-NOS 04/02/2010  . Chronic diastolic heart failure (  [place]) 02/11/2010  . Acute on chronic diastolic heart failure ([place]) 11/26/2009  . SINUS BRADYCARDIA 10/30/2009  . ALLERGIC RHINITIS 09/11/2009  . BACK PAIN 09/11/2009  . MUSCLE STRAIN, RIGHT BUTTOCK 09/11/2009  . SHINGLES 05/11/2009  . SHOULDER PAIN, LEFT 12/29/2008  . Proteinuria 12/12/2008  . Abdominal pain, unspecified site 09/24/2007  . Hx of CABG 05/03/2007  . CHEST PAIN 04/20/2007  . DIZZINESS 02/28/2007  . ANXIETY 02/15/2007  . GERD 02/15/2007  . Gastroparesis 02/15/2007  . [place] DISEASE, CERVICAL 02/15/2007  . [place] DISEASE, LUMBAR 02/15/2007  . SPINAL STENOSIS, LUMBAR 02/15/2007  . PERIPHERAL EDEMA 02/15/2007  . Personal History of Other Diseases of Digestive Disease 02/15/2007    . HYPERLIPIDEMIA 01/01/2007  . GOUT 01/01/2007  . Morbid obesity ([place]) 01/01/2007  . DEPRESSION 01/01/2007  . PERIPHERAL VASCULAR DISEASE 01/01/2007  . DIVERTICULOSIS, COLON 01/01/2007  . OSTEOARTHRITIS 01/01/2007  . LOW BACK PAIN 01/01/2007  . OSTEOPENIA 01/01/2007  . Essential hypertension 10/26/2006    Past Surgical History:  Procedure Laterality Date  . ABDOMINAL HYSTERECTOMY    . CARDIAC CATHETERIZATION     2011  DR COOPER (APPT NEXT WEEK)  . CHOLECYSTECTOMY    . CORONARY ARTERY BYPASS GRAFT  2011   LIMA-LAD, SVG-DIAG, SVG-OM1-OM2, SVG-PDA  . CORONARY STENT PLACEMENT     Drug-eluting stent to the left anterior descending, circumflex and right coronary artery in Jan 2009  . EYE SURGERY     BIL CATARACT REMOVAL 06/2010  . LEFT HEART CATHETERIZATION WITH CORONARY ANGIOGRAM N/A 12/02/2013   Procedure: LEFT HEART CATHETERIZATION WITH CORONARY ANGIOGRAM;  Surgeon: Sinclair Grooms, MD;  Location[facility] C[facility] LAB;  Service: Cardiovascular;  Laterality: N/A;  . OVARIAN CYST REMOVAL    . PACEMAKER INSERTION  03/06/12   MDT Adapta L implanted by Dr Rayann Heman for SSS  . PERMANENT PACEMAKER INSERTION N/A 03/06/2012   Procedure: PERMANENT PACEMAKER INSERTION;  Surgeon: Thompson Grayer, MD;  Location: [facility] CATH LAB;  Service: Cardiovascular;  Laterality: N/A;  . SHOULDER ARTHROSCOPY  06/16/2011   Procedure: ARTHROSCOPY SHOULDER;  Surgeon: Sharmon Revere, MD;  Location: [facility];  Service: Orthopedics;  Laterality: Left;  LEFT SHOULDER ARTHROSCOPY ACROMIALPLASTY, POSSIBLE MINI OPEN CUFF REPAIR   . TUBAL LIGATION       OB History   None      Home Medications    Prior to Admission medications   Medication Sig Start Date End Date Taking? Authorizing Provider  allopurinol (ZYLOPRIM) 100 MG tablet Take 100 mg by mouth daily.    [provider]  aspirin 81 MG tablet Take 81 mg by mouth daily.    [provider]  atorvastatin (LIPITOR) 20 MG tablet TAKE 1 TABLET BY MOUTH  DAILY  06/09/17   Sherren Mocha, MD  azelastine (OPTIVAR) 0.05 % ophthalmic solution Place 1 drop into both eyes 2 (two) times daily. 10/26/15   [provider]  carvedilol (COREG) 6.25 MG tablet Take 1 tablet (6.25 mg total) by mouth 2 (two) times daily. 01/15/18   Erlene Quan, PA-C  clopidogrel (PLAVIX) 75 MG tablet TAKE 1 TABLET BY MOUTH  DAILY 10/27/17   Sherren Mocha, MD  colchicine 0.6 MG tablet Take 0.6 mg by mouth daily as needed (gout).     [provider]  diclofenac sodium (VOLTAREN) 1 % GEL Apply 2 g topically 4 (four) times daily.    [provider]  diclofenac sodium (VOLTAREN) 1 % GEL Apply 2 g topically 2 (two) times daily. Rub  into affected area of foot 2 to 4 times daily 09/12/17   Trula Slade, DPM  ENTRESTO 97-103 MG Take 1 tablet by mouth 2 (two) times daily. 05/05/17   [provider]  HUMULIN N 100 UNIT/ML injection Inject 10-45 Units into the skin 2 (two) times daily before a meal. 45 units in the morning and 10 units at bedtime 07/06/15   [provider]  levothyroxine (SYNTHROID, LEVOTHROID) 100 MCG tablet Take 1 tablet (100 mcg total) by mouth daily. 03/13/12   Renato Shin, MD  NITROSTAT 0.4 MG SL tablet DISSOLVE ONE TABLET UNDER THE TONGUE EVERY 5 MINUTES AS NEEDED FOR CHEST PAIN.  DO NOT EXCEED A TOTAL OF 3 DOSES IN 15 MINUTES 12/04/15   Sherren Mocha, MD  ONE TOUCH ULTRA TEST test strip 1 each by Other route daily as needed (blood sugar).  07/10/13   [provider]  oxyCODONE-acetaminophen (PERCOCET/ROXICET) 5-325 MG tablet Take 1 tablet by mouth daily as needed. 04/24/17   [provider]  pioglitazone (ACTOS) 15 MG tablet Take 15 mg by mouth daily.  07/06/15   [provider]    Family History Family History  Problem Relation Age of Onset  . Diabetes Mother   . Hypertension Mother   . Heart attack Mother   . Stroke Father   . Coronary artery disease Other     Social History Social History    Tobacco Use  . Smoking status: Former Research scientist (life sciences)  . Smokeless tobacco: Never Used  . Tobacco comment: quit 30 yrs ago  Substance Use Topics  . Alcohol use: No  . Drug use: No     Allergies   Ciprofloxacin; Codeine; Hydrocodone; Penicillins; Shellfish allergy; Diltiazem hcl; Sulfonamide derivatives; Lovastatin; and Metformin   Review of Systems Review of Systems  Constitutional: Positive for chills.  Cardiovascular: Positive for chest pain.  Musculoskeletal:       R arm pain   All other systems reviewed and are negative.    Physical Exam Updated Vital Signs BP (!) 164/54   Pulse 60   Temp 100.3 F (37.9 C) (Oral)   Resp (!) 21   Ht _0  (1.702 m)   Wt 98 kg   SpO2 98%   BMI 33.84 kg/m   Physical Exam  Constitutional: She is oriented to person, place, and time.  Uncomfortable, chronically ill   HENT:  Head: Normocephalic.  Eyes: Pupils are equal, round, and reactive to light. EOM are normal.  Neck: Normal range of motion. Neck supple.  Cardiovascular: Normal rate, regular rhythm and normal pulses.  Pulmonary/Chest:  Diminished breath sounds R base. No wheezing   Abdominal: Soft. Bowel sounds are normal.  Musculoskeletal: Normal range of motion.       Right lower leg: Normal.       Left lower leg: Normal.  Diffuse tenderness entire R arm, nl ROM R elbow and shoulder. No obvious deformity, neurovascular intact RUE.   Neurological: She is alert and oriented to person, place, and time.  Skin: Skin is warm. Capillary refill takes less than 2 seconds.  Psychiatric: She has a normal mood and affect. Her behavior is normal.  Nursing note and vitals reviewed.    ED Treatments / Results  Labs (all labs ordered are listed, but only abnormal results are displayed) Labs Reviewed  BASIC METABOLIC PANEL - Abnormal; Notable for the following components:      Result Value   Glucose, Bld 174 (*)    BUN 25 (*)  GFR calc non Af Amer 52 (*)    All other components  within normal limits  CBC - Abnormal; Notable for the following components:   WBC 12.1 (*)    Hemoglobin 11.5 (*)    All other components within normal limits  CULTURE, BLOOD (ROUTINE X 2)  CULTURE, BLOOD (ROUTINE X 2)  URINE CULTURE  CK  URINALYSIS, ROUTINE W REFLEX MICROSCOPIC  I-STAT TROPONIN, ED  I-STAT CG4 LACTIC ACID, ED    EKG EKG Interpretation  Date/Time:  Sunday February 04 2018 14:23:02 EDT Ventricular Rate:  62 PR Interval:  230 QRS Duration: 74 QT Interval:  384 QTC Calculation: 389 R Axis:   -3 Text Interpretation:  AV dual-paced rhythm with prolonged AV conduction Abnormal ECG nonspecific changes since previous  Confirmed by Wandra Arthurs [phone]) on 02/04/2018 3:20:48 PM   Radiology No results found.  Procedures Procedures (including critical care time)  Medications Ordered in ED Medications  morphine 4 MG/ML injection 4 mg (has no administration in time range)  ondansetron (ZOFRAN) injection 4 mg (has no administration in time range)  acetaminophen (TYLENOL) tablet 650 mg (has no administration in time range)  sodium chloride 0.9 % bolus 500 mL (has no administration in time range)     Initial Impression / Assessment and Plan / ED Course  I have reviewed the triage vital signs and the nursing notes.  Pertinent labs & imaging results that were available during my care of the patient were reviewed by me and considered in my medical decision making (see chart for details).    [name] is a 84 y.o. female here with R arm pain, chest pain, chills. Temp 100.3 F, diminished breath sounds R lung base. Consider pneumonia with sepsis. Will do sepsis workup with labs, lactate, cultures, CXR. Has hx of CAD so will get trop but symptoms atypical for ACS.   7:34 PM Lactate nl. Delta trop neg. WBC 12. UA ? UTI. CXR read as normal but she has some diminished breath sounds R base and I think patient has pneumonia clinically. Given rocephin. Will dc home with  keflex, zpack for CAP, UTI.    Final Clinical Impressions(s) / ED Diagnoses   Final diagnoses:  None    ED Discharge Orders    None       Drenda Freeze, MD 02/04/18 [place]

## 2018-02-04 NOTE — ED Notes (Signed)
Patient verbalizes understanding of discharge instructions. Opportunity for questioning and answers were provided. Armband removed by staff, pt discharged from ED in wheelchair with family.  

## 2018-02-04 NOTE — ED Notes (Signed)
Pt denies chest pain at this time.

## 2018-02-04 NOTE — Discharge Instructions (Addendum)
Take keflex for UTI.   Take zpack for pneumonia.   See your doctor.  Drink plenty of water   Take tylenol for fever  Return to ER if you have worse chest pain, cough, trouble breathing,

## 2018-02-04 NOTE — ED Notes (Signed)
Placed purewick on pt. Pt informed of procedure, tolerated well.

## 2018-02-05 LAB — URINE CULTURE: CULTURE: NO GROWTH

## 2018-02-06 DIAGNOSIS — R600 Localized edema: Secondary | ICD-10-CM | POA: Diagnosis not present

## 2018-02-06 DIAGNOSIS — I1310 Hypertensive heart and chronic kidney disease without heart failure, with stage 1 through stage 4 chronic kidney disease, or unspecified chronic kidney disease: Secondary | ICD-10-CM | POA: Diagnosis not present

## 2018-02-06 DIAGNOSIS — M130 Polyarthritis, unspecified: Secondary | ICD-10-CM | POA: Diagnosis not present

## 2018-02-06 LAB — CUP PACEART REMOTE DEVICE CHECK
Battery Remaining Longevity: 77 mo
Brady Statistic AP VP Percent: 74 %
Date Time Interrogation Session: 20191007153533
Implantable Lead Implant Date: 20131126
Implantable Lead Location: 753860
Implantable Pulse Generator Implant Date: 20131126
Lead Channel Impedance Value: 416 Ohm
Lead Channel Pacing Threshold Amplitude: 1.125 V
Lead Channel Pacing Threshold Pulse Width: 0.4 ms
Lead Channel Setting Pacing Amplitude: 2 V
MDC IDC LEAD IMPLANT DT: 20131126
MDC IDC LEAD LOCATION: 753859
MDC IDC MSMT BATTERY IMPEDANCE: 504 Ohm
MDC IDC MSMT BATTERY VOLTAGE: 2.78 V
MDC IDC MSMT LEADCHNL RA PACING THRESHOLD AMPLITUDE: 0.5 V
MDC IDC MSMT LEADCHNL RA PACING THRESHOLD PULSEWIDTH: 0.4 ms
MDC IDC MSMT LEADCHNL RV IMPEDANCE VALUE: 473 Ohm
MDC IDC SET LEADCHNL RV PACING AMPLITUDE: 2.5 V
MDC IDC SET LEADCHNL RV PACING PULSEWIDTH: 0.4 ms
MDC IDC SET LEADCHNL RV SENSING SENSITIVITY: 2 mV
MDC IDC STAT BRADY AP VS PERCENT: 8 %
MDC IDC STAT BRADY AS VP PERCENT: 15 %
MDC IDC STAT BRADY AS VS PERCENT: 2 %

## 2018-02-09 LAB — CULTURE, BLOOD (ROUTINE X 2)
CULTURE: NO GROWTH
Culture: NO GROWTH
SPECIAL REQUESTS: ADEQUATE
Special Requests: ADEQUATE

## 2018-02-13 DIAGNOSIS — E113293 Type 2 diabetes mellitus with mild nonproliferative diabetic retinopathy without macular edema, bilateral: Secondary | ICD-10-CM | POA: Diagnosis not present

## 2018-02-13 DIAGNOSIS — H353111 Nonexudative age-related macular degeneration, right eye, early dry stage: Secondary | ICD-10-CM | POA: Diagnosis not present

## 2018-02-13 DIAGNOSIS — H348312 Tributary (branch) retinal vein occlusion, right eye, stable: Secondary | ICD-10-CM | POA: Diagnosis not present

## 2018-02-13 DIAGNOSIS — H353221 Exudative age-related macular degeneration, left eye, with active choroidal neovascularization: Secondary | ICD-10-CM | POA: Diagnosis not present

## 2018-02-27 DIAGNOSIS — E119 Type 2 diabetes mellitus without complications: Secondary | ICD-10-CM | POA: Diagnosis not present

## 2018-02-27 DIAGNOSIS — I130 Hypertensive heart and chronic kidney disease with heart failure and stage 1 through stage 4 chronic kidney disease, or unspecified chronic kidney disease: Secondary | ICD-10-CM | POA: Diagnosis not present

## 2018-04-06 ENCOUNTER — Encounter: Payer: Self-pay | Admitting: Cardiovascular Disease

## 2018-04-16 ENCOUNTER — Ambulatory Visit (INDEPENDENT_AMBULATORY_CARE_PROVIDER_SITE_OTHER): Payer: Medicare Other

## 2018-04-16 DIAGNOSIS — I495 Sick sinus syndrome: Secondary | ICD-10-CM | POA: Diagnosis not present

## 2018-04-17 LAB — CUP PACEART REMOTE DEVICE CHECK
Battery Impedance: 554 Ohm
Battery Voltage: 2.78 V
Brady Statistic AP VP Percent: 76 %
Brady Statistic AP VS Percent: 7 %
Brady Statistic AS VS Percent: 2 %
Date Time Interrogation Session: 20200107195333
Implantable Lead Implant Date: 20131126
Implantable Lead Location: 753859
Implantable Lead Model: 5076
Implantable Lead Model: 5092
Lead Channel Impedance Value: 416 Ohm
Lead Channel Impedance Value: 464 Ohm
Lead Channel Pacing Threshold Amplitude: 0.5 V
Lead Channel Pacing Threshold Pulse Width: 0.4 ms
Lead Channel Setting Pacing Amplitude: 2.5 V
MDC IDC LEAD IMPLANT DT: 20131126
MDC IDC LEAD LOCATION: 753860
MDC IDC MSMT BATTERY REMAINING LONGEVITY: 73 mo
MDC IDC MSMT LEADCHNL RV PACING THRESHOLD AMPLITUDE: 1.125 V
MDC IDC MSMT LEADCHNL RV PACING THRESHOLD PULSEWIDTH: 0.4 ms
MDC IDC PG IMPLANT DT: 20131126
MDC IDC SET LEADCHNL RA PACING AMPLITUDE: 2 V
MDC IDC SET LEADCHNL RV PACING PULSEWIDTH: 0.4 ms
MDC IDC SET LEADCHNL RV SENSING SENSITIVITY: 2 mV
MDC IDC STAT BRADY AS VP PERCENT: 15 %

## 2018-04-17 NOTE — Progress Notes (Signed)
Remote pacemaker transmission.   

## 2018-05-01 DIAGNOSIS — E039 Hypothyroidism, unspecified: Secondary | ICD-10-CM | POA: Diagnosis not present

## 2018-05-01 DIAGNOSIS — N189 Chronic kidney disease, unspecified: Secondary | ICD-10-CM | POA: Diagnosis not present

## 2018-05-01 DIAGNOSIS — I10 Essential (primary) hypertension: Secondary | ICD-10-CM | POA: Diagnosis not present

## 2018-05-01 DIAGNOSIS — E119 Type 2 diabetes mellitus without complications: Secondary | ICD-10-CM | POA: Diagnosis not present

## 2018-05-01 DIAGNOSIS — I130 Hypertensive heart and chronic kidney disease with heart failure and stage 1 through stage 4 chronic kidney disease, or unspecified chronic kidney disease: Secondary | ICD-10-CM | POA: Diagnosis not present

## 2018-05-07 ENCOUNTER — Ambulatory Visit: Payer: Medicare Other | Admitting: Cardiovascular Disease

## 2018-05-07 ENCOUNTER — Encounter: Payer: Self-pay | Admitting: Cardiovascular Disease

## 2018-05-07 ENCOUNTER — Encounter (INDEPENDENT_AMBULATORY_CARE_PROVIDER_SITE_OTHER): Payer: Self-pay

## 2018-05-07 VITALS — BP 150/60 | HR 48 | Ht 67.0 in | Wt 231.4 lb

## 2018-05-07 DIAGNOSIS — I129 Hypertensive chronic kidney disease with stage 1 through stage 4 chronic kidney disease, or unspecified chronic kidney disease: Secondary | ICD-10-CM | POA: Diagnosis not present

## 2018-05-07 DIAGNOSIS — I2510 Atherosclerotic heart disease of native coronary artery without angina pectoris: Secondary | ICD-10-CM | POA: Diagnosis not present

## 2018-05-07 DIAGNOSIS — I5033 Acute on chronic diastolic (congestive) heart failure: Secondary | ICD-10-CM

## 2018-05-07 DIAGNOSIS — I6523 Occlusion and stenosis of bilateral carotid arteries: Secondary | ICD-10-CM | POA: Diagnosis not present

## 2018-05-07 DIAGNOSIS — N183 Chronic kidney disease, stage 3 (moderate): Secondary | ICD-10-CM

## 2018-05-07 MED ORDER — FUROSEMIDE 40 MG PO TABS: 40.0000 mg | ORAL_TABLET | Freq: Every day | ORAL | 3 refills | Status: DC

## 2018-05-07 MED ORDER — POTASSIUM CHLORIDE ER 10 MEQ PO TBCR: 10.0000 meq | EXTENDED_RELEASE_TABLET | Freq: Every day | ORAL | 0 refills | Status: DC

## 2018-05-07 MED ORDER — FUROSEMIDE 40 MG PO TABS: 40.0000 mg | ORAL_TABLET | Freq: Every day | ORAL | 0 refills | Status: DC

## 2018-05-07 MED ORDER — NITROGLYCERIN 0.4 MG SL SUBL: SUBLINGUAL_TABLET | SUBLINGUAL | 3 refills | Status: DC

## 2018-05-07 MED ORDER — POTASSIUM CHLORIDE ER 10 MEQ PO TBCR: 10.0000 meq | EXTENDED_RELEASE_TABLET | Freq: Every day | ORAL | 3 refills | Status: DC

## 2018-05-07 NOTE — Progress Notes (Signed)
Cardiology Office Note:    Date:  05/07/2018   ID:  [name], DOB 25-Jan-1933, MRN [id number]  PCP:  Lucianne Lei, MD  Cardiologist:  Sherren Mocha, MD  Electrophysiologist:  None   Referring MD: Lucianne Lei, MD   Chief Complaint  Patient presents with  . Shortness of Breath    History of Present Illness:    [name] is a 85 y.o. female with a hx of coronary artery disease status post remote CABG, chronic diastolic heart failure, hypertension, and peripheral arterial disease.  The patient is here with her daughter today.  She complains of shortness of breath with activity.  She is short of breath with any amount of significant walking, clearly symptomatic with less than 1 block of walking.  She ambulates with a walker.  Her mobility is fairly limited.  She denies orthopnea or PND.  She denies chest pain or pressure.  She has complained of worsening swelling in her legs.  She is also had some swelling in her left hand.  No fevers, chills, or cough.  No pain in her legs or her left hand.  Past Medical History:  Diagnosis Date  . Allergic rhinitis   . Anemia   . Anxiety   . Barrett esophagus   . CAD (coronary artery disease) 2009   a. Multivessel s/p PCI w/DES 2009 // b. s/p CABG 2011  //  c. LHC 8/15: pLAD 95 ISR, LCx 100, pOM1 40, dRCA 100, S-OM1/OM2 ok, S-D1 ok, S-PDA ok, L-LAD ok, EF 60%  . Carotid artery disease ([place])    a. Carotid US 4/01: RICA 0-27%; LICA 25-36% >> FU 1 year  //  b. Carotid US 9/17: R 1-39%, L 40-59% >> FU 1 year  . Chronic diastolic heart failure ([place])   . CKD (chronic kidney disease), stage II    GFR 60-89 ml/min  . Depression   . Disc disease, degenerative, cervical   . Diverticulosis   . Gastroparesis   . GERD (gastroesophageal reflux disease)   . Gout   . H/O hiatal hernia   . Helicobacter pylori gastritis   . History of echocardiogram    a. Echo 11/13: EF 55% to 60%. Grade 2 diastolic dysfunction, MAC, trivial MR, mild LAE, normal  RVSF, mild RAE, PASP 39 mmHg  //  b. Echo 4/17: EF 55-60%, normal wall motion, trivial AI, MAC, moderate LAE, mild RVE, PASP 35 mmHg  . History of thrombocytopenia   . HTN (hypertension)   . Hyperlipidemia   . Hypothyroidism   . LBP (low back pain)    Lumbar disc disease/lumbar spinal stenosis  . Morbid obesity ([place])   . Myocardial infarction ([place])   . Osteoarthritis   . Osteopenia   . PVD (peripheral vascular disease) ([place])   . Sick sinus syndrome [facility])    MDT Dual-chamber PPM implant 02/2012  . Type II or unspecified type diabetes mellitus without mention of complication, not stated as uncontrolled     Past Surgical History:  Procedure Laterality Date  . ABDOMINAL HYSTERECTOMY    . CARDIAC CATHETERIZATION     2011  DR Luda Charbonneau (APPT NEXT WEEK)  . CHOLECYSTECTOMY    . CORONARY ARTERY BYPASS GRAFT  2011   LIMA-LAD, SVG-DIAG, SVG-OM1-OM2, SVG-PDA  . CORONARY STENT PLACEMENT     Drug-eluting stent to the left anterior descending, circumflex and right coronary artery in Jan 2009  . EYE SURGERY     BIL CATARACT REMOVAL 06/2010  . LEFT  HEART CATHETERIZATION WITH CORONARY ANGIOGRAM N/A 12/02/2013   Procedure: LEFT HEART CATHETERIZATION WITH CORONARY ANGIOGRAM;  Surgeon: Sinclair Grooms, MD;  Location: [facility] CATH LAB;  Service: Cardiovascular;  Laterality: N/A;  . OVARIAN CYST REMOVAL    . PACEMAKER INSERTION  03/06/12   MDT Adapta L implanted by Dr Rayann Heman for SSS  . PERMANENT PACEMAKER INSERTION N/A 03/06/2012   Procedure: PERMANENT PACEMAKER INSERTION;  Surgeon: Thompson Grayer, MD;  Location: [facility] CATH LAB;  Service: Cardiovascular;  Laterality: N/A;  . SHOULDER ARTHROSCOPY  06/16/2011   Procedure: ARTHROSCOPY SHOULDER;  Surgeon: Sharmon Revere, MD;  Location: [place];  Service: Orthopedics;  Laterality: Left;  LEFT SHOULDER ARTHROSCOPY ACROMIALPLASTY, POSSIBLE MINI OPEN CUFF REPAIR   . TUBAL LIGATION      Current Medications: Current Meds  Medication Sig  . acetaminophen (TYLENOL) 500 MG  tablet Take 500 mg by mouth every 6 (six) hours as needed for headache (pain).  Marland Kitchen allopurinol (ZYLOPRIM) 100 MG tablet Take 100 mg by mouth daily.  Marland Kitchen amLODipine (NORVASC) 5 MG tablet Take 5 mg by mouth 2 (two) times daily.   Marland Kitchen aspirin EC 81 MG tablet Take 81 mg by mouth daily.  Marland Kitchen atorvastatin (LIPITOR) 20 MG tablet TAKE 1 TABLET BY MOUTH  DAILY  . azelastine (OPTIVAR) 0.05 % ophthalmic solution Place 1 drop into both eyes 2 (two) times daily.  . carvedilol (COREG) 6.25 MG tablet Take 1 tablet (6.25 mg total) by mouth 2 (two) times daily.  . clopidogrel (PLAVIX) 75 MG tablet TAKE 1 TABLET BY MOUTH  DAILY  . colchicine 0.6 MG tablet Take 0.6 mg by mouth daily as needed (gout).   . insulin NPH Human (HUMULIN N,NOVOLIN N) 100 UNIT/ML injection Inject 5-45 Units into the skin See admin instructions. Inject 45 units subcutaneously every morning and 10 units at night  . levothyroxine (SYNTHROID, LEVOTHROID) 100 MCG tablet Take 1 tablet (100 mcg total) by mouth daily.  . Multiple Vitamin (MULTIVITAMIN WITH MINERALS) TABS tablet Take 1 tablet by mouth daily.  . nitroGLYCERIN (NITROSTAT) 0.4 MG SL tablet DISSOLVE ONE TABLET UNDER THE TONGUE EVERY 5 MINUTES AS NEEDED FOR CHEST PAIN.  DO NOT EXCEED A TOTAL OF 3 DOSES IN 15 MINUTES  . ONE TOUCH ULTRA TEST test strip 1 each by Other route daily as needed (blood sugar).   Marland Kitchen oxyCODONE-acetaminophen (PERCOCET/ROXICET) 5-325 MG tablet Take 1 tablet by mouth daily as needed (pain).   . Polyvinyl Alcohol-Povidone (REFRESH OP) Place 1 drop into both eyes 4 (four) times daily as needed (dry eyes).  . sacubitril-valsartan (ENTRESTO) 97-103 MG Take 1 tablet by mouth 2 (two) times daily.  . [DISCONTINUED] NITROSTAT 0.4 MG SL tablet DISSOLVE ONE TABLET UNDER THE TONGUE EVERY 5 MINUTES AS NEEDED FOR CHEST PAIN.  DO NOT EXCEED A TOTAL OF 3 DOSES IN 15 MINUTES   Current Facility-Administered Medications for the 05/07/18 encounter (Office Visit) with Sherren Mocha, MD    Medication  . betamethasone acetate-betamethasone sodium phosphate (CELESTONE) injection 3 mg     Allergies:   Ciprofloxacin; Codeine; Hydrocodone; Penicillins; Shellfish allergy; Diltiazem hcl; Sulfonamide derivatives; Morphine and related; Lovastatin; and Metformin   Social History   Socioeconomic History  . Marital status: Widowed    Spouse name: Not on file  . Number of children: Not on file  . Years of education: Not on file  . Highest education level: Not on file  Occupational History  . Occupation: RETIRED LPN  Social Needs  . Financial  resource strain: Not on file  . Food insecurity:    Worry: Not on file    Inability: Not on file  . Transportation needs:    Medical: Not on file    Non-medical: Not on file  Tobacco Use  . Smoking status: Former Research scientist (life sciences)  . Smokeless tobacco: Never Used  . Tobacco comment: quit 30 yrs ago  Substance and Sexual Activity  . Alcohol use: No  . Drug use: No  . Sexual activity: Not on file  Lifestyle  . Physical activity:    Days per week: Not on file    Minutes per session: Not on file  . Stress: Not on file  Relationships  . Social connections:    Talks on phone: Not on file    Gets together: Not on file    Attends religious service: Not on file    Active member of club or organization: Not on file    Attends meetings of clubs or organizations: Not on file    Relationship status: Not on file  Other Topics Concern  . Not on file  Social History Narrative   Widowed 2004.., Lives alone..Family history is negative for premature coronary artery disease. Mother died at age 9 with heart disease in her later years, father died at age 71 from a stroke.Marland KitchenShe  has 8 siblings, none of whom have coronary artery disease.           Family History: The patient's family history includes Coronary artery disease in an other family member; Diabetes in her mother; Heart attack in her mother; Hypertension in her mother; Stroke in her  father.  ROS:   Please see the history of present illness.    Positive for depression, back pain, joint swelling all other systems reviewed and are negative.  EKGs/Labs/Other Studies Reviewed:    The following studies were reviewed today: Echocardiogram 07/13/2015: Study Conclusions  - Left ventricle: The cavity size was normal. Wall thickness was   normal. Systolic function was normal. The estimated ejection   fraction was in the range of 55% to 60%. Wall motion was normal;   there were no regional wall motion abnormalities. - Ventricular septum: Septal motion showed paradox. These changes   are consistent with right ventricular pacing. - Aortic valve: There was trivial regurgitation. - Mitral valve: Calcified annulus. - Left atrium: The atrium was moderately dilated. - Right ventricle: The cavity size was mildly dilated. - Pulmonary arteries: PA peak pressure: 35 mm Hg (S).  Carotid Doppler 01/29/2018: Summary: Right Carotid: Velocities in the right ICA are consistent with a 1-39% stenosis.                Non-hemodynamically significant plaque <50% noted in the CCA.  Left Carotid: Velocities in the left ICA are consistent with a 1-39% stenosis.               Non-hemodynamically significant plaque noted in the CCA. The ECA               appears >50% stenosed.  Vertebrals:  Bilateral vertebral arteries demonstrate antegrade flow. Subclavians: Normal flow hemodynamics were seen in bilateral subclavian              arteries.  EKG:  EKG is not ordered today.    Recent Labs: 02/04/2018: BUN 25; Creatinine, Ser 0.98; Hemoglobin 11.5; Platelets 196; Potassium 3.8; Sodium 140  Recent Lipid Panel    Component Value Date/Time   CHOL 149 05/11/2015 0749  TRIG 66 05/11/2015 0749   TRIG 192 (H) 04/20/2006 1246   HDL 97 05/11/2015 0749   CHOLHDL 1.5 05/11/2015 0749   VLDL 13 05/11/2015 0749   LDLCALC 39 05/11/2015 0749   LDLDIRECT 82.5 09/11/2009 0000    Physical Exam:     VS:  BP (!) 150/60   Pulse (!) 48   Ht _0  (1.702 m)   Wt 231 lb 6.4 oz (105 kg)   SpO2 99%   BMI 36.24 kg/m     Wt Readings from Last 3 Encounters:  05/07/18 231 lb 6.4 oz (105 kg)  02/04/18 216 lb 0.8 oz (98 kg)  01/15/18 218 lb (98.9 kg)     GEN: well nourished, well developed, pleasant elderly woman in no acute distress HEENT: Normal NECK: No JVD; bilateral carotid bruits LYMPHATICS: No lymphadenopathy CARDIAC: RRR, 2/6 systolic murmur at the RUSB RESPIRATORY:  Clear to auscultation without rales, wheezing or rhonchi  ABDOMEN: Soft, non-tender, non-distended MUSCULOSKELETAL:  2+ bilateral pretibial edema; No deformity  SKIN: Warm and dry NEUROLOGIC:  Alert and oriented x 3 PSYCHIATRIC:  Normal affect   ASSESSMENT:    1. Coronary artery disease involving native coronary artery of native heart without angina pectoris   2. Acute on chronic diastolic heart failure ([place])   3. Asymptomatic bilateral carotid artery stenosis   4. 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 The patient is stable without symptoms of angina.  We will continue her current medical program.  She takes clopidogrel for antiplatelet therapy.  She is treated with atorvastatin for her lipids. 2. The patient has clear evidence of volume overload on today's exam.  Her JVP is elevated and leg edema is worse than normal.  I recommended reinitiation of furosemide 40 mg daily and potassium chloride 10 mEq daily.  We will follow-up with a metabolic panel in 2 weeks.  We discussed sodium restriction.  She will continue her treatment with a beta-blocker and Entresto. 3. I reviewed her most recent carotid scan which showed no high-grade obstruction.  She had less than 40% bilateral carotid stenosis. 4. Most recent creatinine has been excellent.  Will follow closely with diuretic added to her regimen.   Medication Adjustments/Labs and  Tests Ordered: Current medicines are reviewed at length with the patient today.  Concerns regarding medicines are outlined above.  Orders Placed This Encounter  Procedures  . Basic metabolic panel   Meds ordered this encounter  Medications  . nitroGLYCERIN (NITROSTAT) 0.4 MG SL tablet    Sig: DISSOLVE ONE TABLET UNDER THE TONGUE EVERY 5 MINUTES AS NEEDED FOR CHEST PAIN.  DO NOT EXCEED A TOTAL OF 3 DOSES IN 15 MINUTES    Dispense:  25 tablet    Refill:  3  . DISCONTD: furosemide (LASIX) 40 MG tablet    Sig: Take 1 tablet (40 mg total) by mouth daily.    Dispense:  90 tablet    Refill:  3  . DISCONTD: potassium chloride (K-DUR) 10 MEQ tablet    Sig: Take 1 tablet (10 mEq total) by mouth daily.    Dispense:  90 tablet    Refill:  3  . potassium chloride (K-DUR) 10 MEQ tablet    Sig: Take 1 tablet (10 mEq total) by mouth daily for 30 days.    Dispense:  30 tablet    Refill:  0  . furosemide (LASIX) 40  MG tablet    Sig: Take 1 tablet (40 mg total) by mouth daily for 30 days.    Dispense:  30 tablet    Refill:  0    Patient Instructions  Medication Instructions:  1) START LASIX 40 mg daily 2) START POTASSIUM (Kdur) 10 meq daily  Labwork: Your provider recommends that you return for lab work in 2 weeks. You do NOT need to be fasting.  Testing/Procedures: None  Follow-Up: You have an appointment with Dr. Antionette Char assistant, Richardson Dopp, on Monday, August 06, 2018 at 9:45AM.      Signed, Sherren Mocha, MD  05/07/2018 5:13 PM    [place]

## 2018-05-07 NOTE — Patient Instructions (Signed)
Medication Instructions:  1) START LASIX 40 mg daily 2) START POTASSIUM (Kdur) 10 meq daily  Labwork: Your provider recommends that you return for lab work in 2 weeks. You do NOT need to be fasting.  Testing/Procedures: None  Follow-Up: You have an appointment with Dr. Earmon Phoenix assistant, Tereso Newcomer, on Monday, August 06, 2018 at 9:45AM.

## 2018-05-15 ENCOUNTER — Other Ambulatory Visit: Payer: Self-pay | Admitting: Cardiovascular Disease

## 2018-05-15 DIAGNOSIS — E785 Hyperlipidemia, unspecified: Secondary | ICD-10-CM

## 2018-05-15 DIAGNOSIS — I5032 Chronic diastolic (congestive) heart failure: Secondary | ICD-10-CM

## 2018-05-20 ENCOUNTER — Other Ambulatory Visit: Payer: Self-pay | Admitting: Cardiology

## 2018-05-21 ENCOUNTER — Other Ambulatory Visit: Payer: Medicare Other | Admitting: *Deleted

## 2018-05-21 DIAGNOSIS — I5033 Acute on chronic diastolic (congestive) heart failure: Secondary | ICD-10-CM | POA: Diagnosis not present

## 2018-05-21 DIAGNOSIS — I2510 Atherosclerotic heart disease of native coronary artery without angina pectoris: Secondary | ICD-10-CM

## 2018-05-21 LAB — BASIC METABOLIC PANEL
BUN/Creatinine Ratio: 31 — ABNORMAL HIGH (ref 12–28)
BUN: 51 mg/dL — ABNORMAL HIGH (ref 8–27)
CALCIUM: 9.3 mg/dL (ref 8.7–10.3)
CHLORIDE: 100 mmol/L (ref 96–106)
CO2: 25 mmol/L (ref 20–29)
Creatinine, Ser: 1.67 mg/dL — ABNORMAL HIGH (ref 0.57–1.00)
GFR calc Af Amer: 32 mL/min/{1.73_m2} — ABNORMAL LOW (ref >59)
GFR, EST NON AFRICAN AMERICAN: 28 mL/min/{1.73_m2} — AB (ref >59)
GLUCOSE: 132 mg/dL — AB (ref 65–99)
POTASSIUM: 4.1 mmol/L (ref 3.5–5.2)
Sodium: 140 mmol/L (ref 134–144)

## 2018-05-28 ENCOUNTER — Telehealth: Payer: Self-pay

## 2018-05-28 MED ORDER — FUROSEMIDE 40 MG PO TABS: 40.0000 mg | ORAL_TABLET | Freq: Every day | ORAL | 11 refills | Status: DC | PRN

## 2018-05-28 MED ORDER — POTASSIUM CHLORIDE ER 10 MEQ PO TBCR: 10.0000 meq | EXTENDED_RELEASE_TABLET | Freq: Every day | ORAL | 11 refills | Status: DC | PRN

## 2018-05-28 NOTE — Telephone Encounter (Signed)
-----   Message from Tonny Bollman, MD sent at 05/27/2018  9:30 PM EST ----- Looks like lasix is taking a toll on her kidneys. Would switch to 'as needed' use and only take potassium on days that she takes lasix. thanks

## 2018-05-28 NOTE — Telephone Encounter (Signed)
Reviewed results with patient who verbalized understanding.   Instructed patient to only take Lasix once daily as needed for swelling or weight gain and to only take potassium on days she takes Lasix. She was grateful for call and agrees with treatment plan.

## 2018-06-05 DIAGNOSIS — H353222 Exudative age-related macular degeneration, left eye, with inactive choroidal neovascularization: Secondary | ICD-10-CM | POA: Diagnosis not present

## 2018-06-05 DIAGNOSIS — H348312 Tributary (branch) retinal vein occlusion, right eye, stable: Secondary | ICD-10-CM | POA: Diagnosis not present

## 2018-06-05 DIAGNOSIS — H353111 Nonexudative age-related macular degeneration, right eye, early dry stage: Secondary | ICD-10-CM | POA: Diagnosis not present

## 2018-06-05 DIAGNOSIS — E113293 Type 2 diabetes mellitus with mild nonproliferative diabetic retinopathy without macular edema, bilateral: Secondary | ICD-10-CM | POA: Diagnosis not present

## 2018-06-07 DIAGNOSIS — I10 Essential (primary) hypertension: Secondary | ICD-10-CM | POA: Diagnosis not present

## 2018-06-07 DIAGNOSIS — R21 Rash and other nonspecific skin eruption: Secondary | ICD-10-CM | POA: Diagnosis not present

## 2018-06-07 DIAGNOSIS — Z1231 Encounter for screening mammogram for malignant neoplasm of breast: Secondary | ICD-10-CM | POA: Diagnosis not present

## 2018-06-07 DIAGNOSIS — E039 Hypothyroidism, unspecified: Secondary | ICD-10-CM | POA: Diagnosis not present

## 2018-07-03 DIAGNOSIS — E1169 Type 2 diabetes mellitus with other specified complication: Secondary | ICD-10-CM | POA: Diagnosis not present

## 2018-07-03 DIAGNOSIS — L304 Erythema intertrigo: Secondary | ICD-10-CM | POA: Diagnosis not present

## 2018-07-10 IMAGING — CR DG CHEST 2V
2 series · 2 of 2 positions shown · non-contrast
Comparison: 10/06/2016.

CLINICAL DATA: Weakness, headache and nausea

EXAM:
CHEST  2 VIEW

[chest lat]
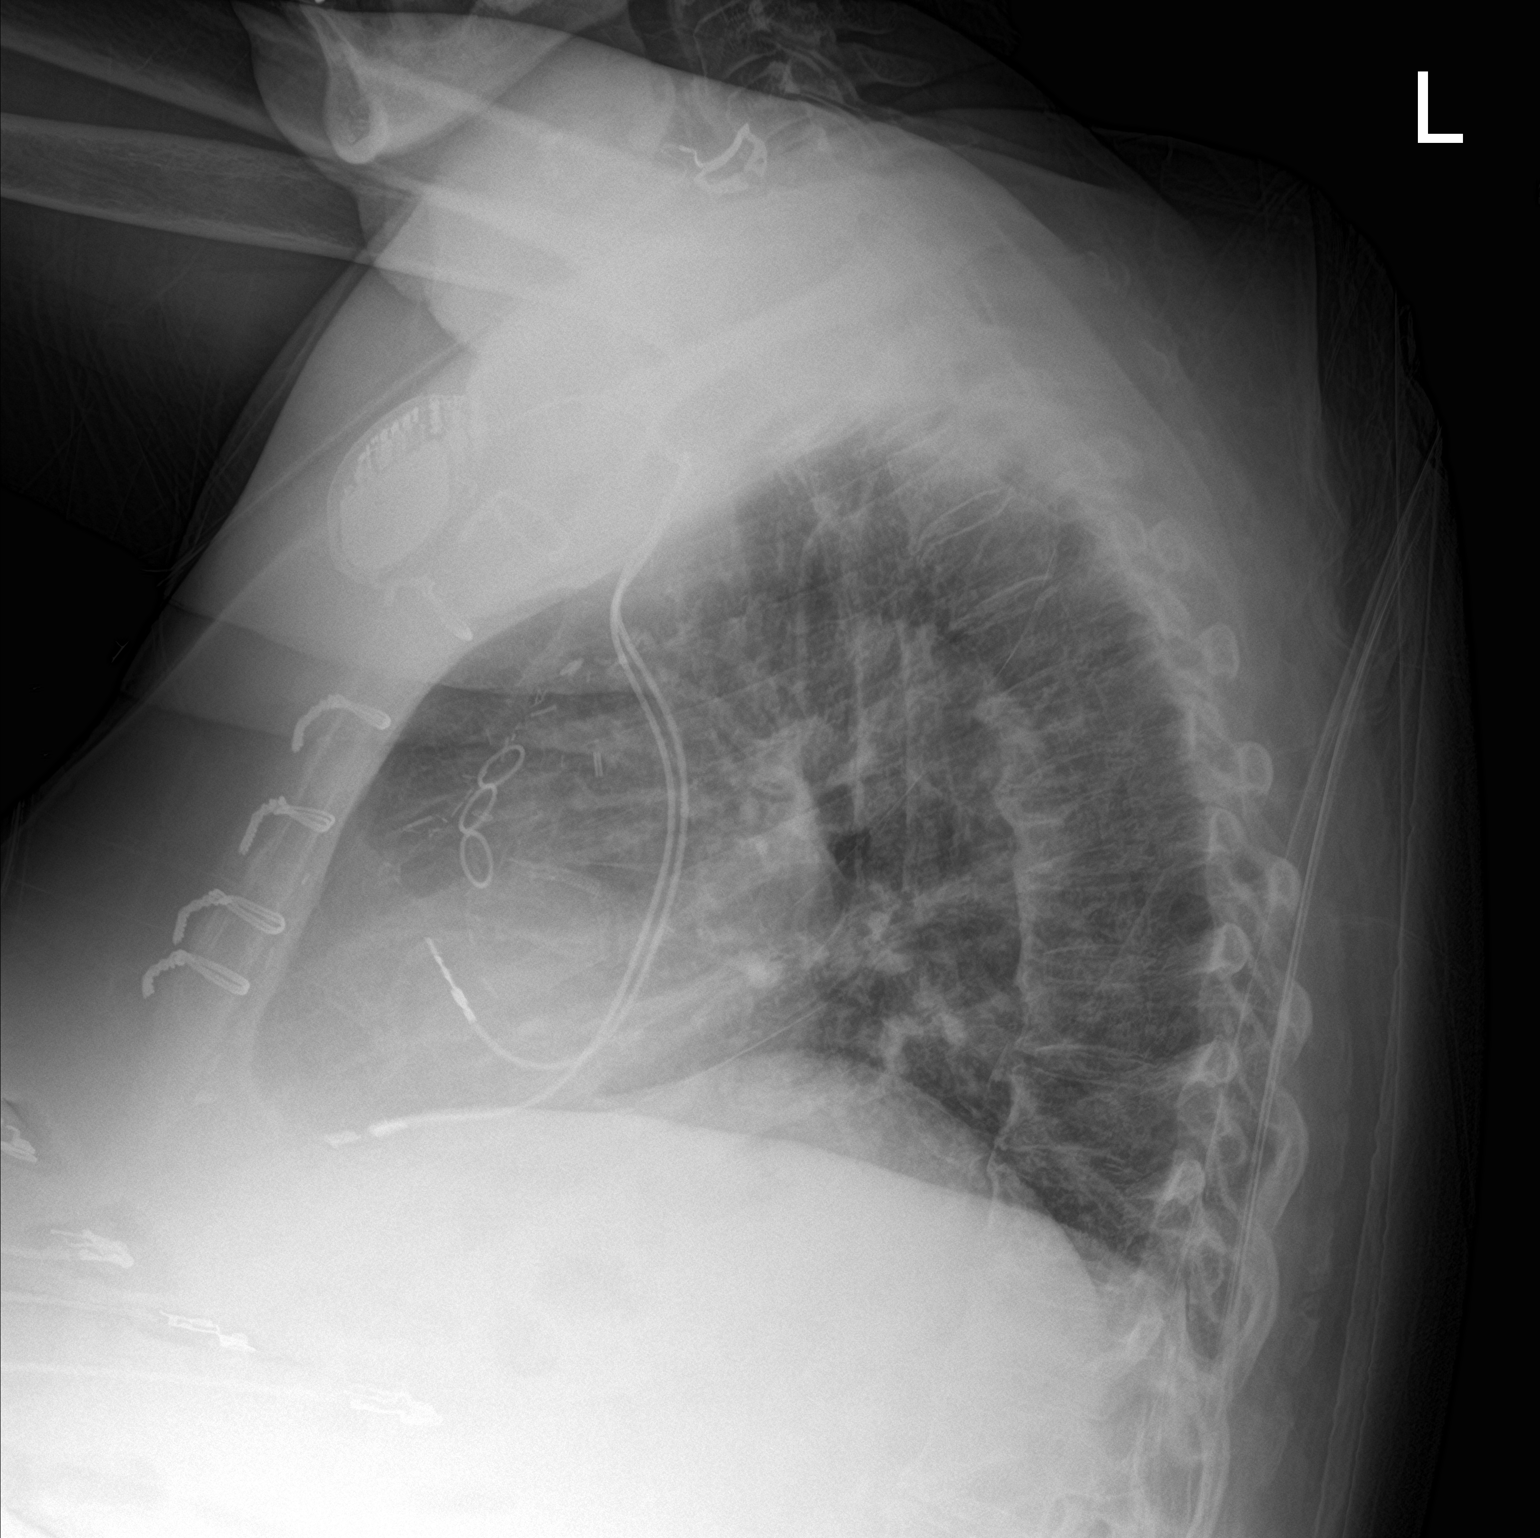

[chest ap]
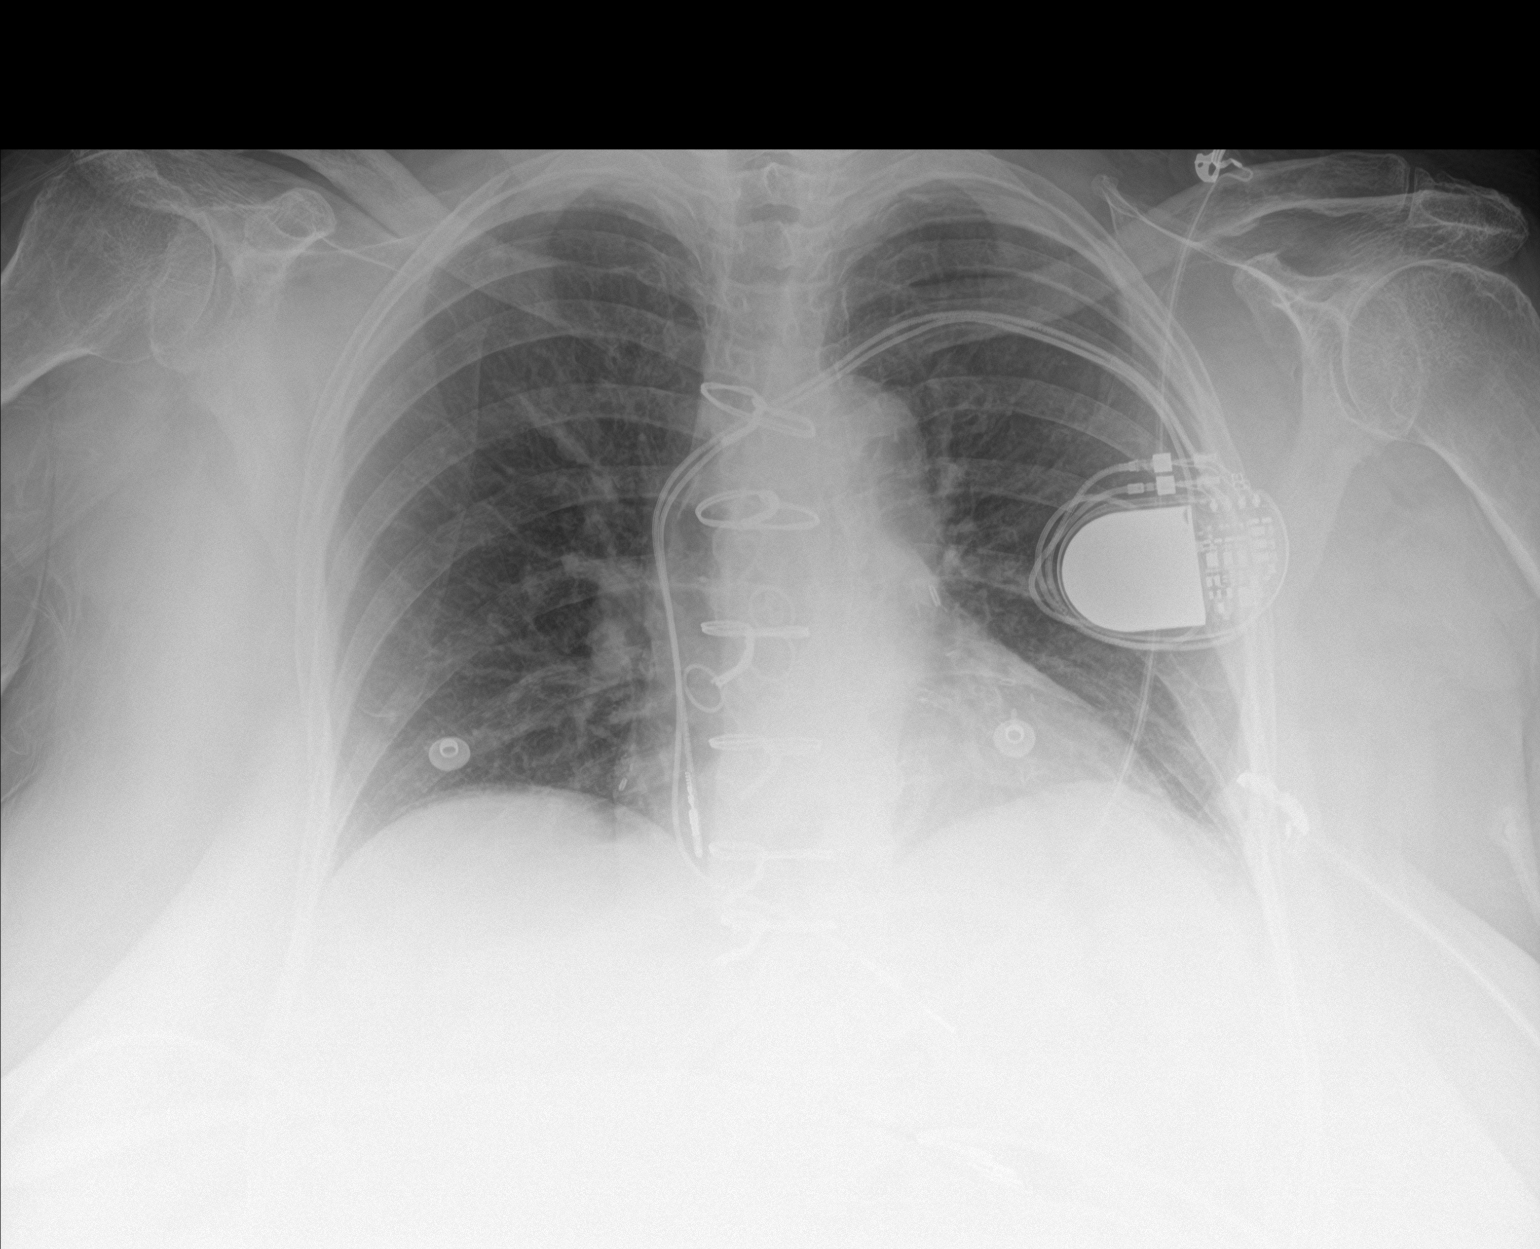

[2 of 2 positions shown; findings below may reference images not displayed]

FINDINGS: Lung volumes are low with borderline cardiomegaly and aortic
atherosclerosis. Right atrial and right ventricular pacing leads are
again noted with left-sided pacemaker apparatus. No pneumonic
consolidation, effusion nor overt pulmonary edema. No acute nor
suspicious osseous abnormalities.
IMPRESSION: Stable appearance of the chest with aortic atherosclerosis and
borderline cardiomegaly. No acute pneumonic consolidation or CHF.

## 2018-07-16 ENCOUNTER — Other Ambulatory Visit: Payer: Self-pay

## 2018-07-16 ENCOUNTER — Ambulatory Visit (INDEPENDENT_AMBULATORY_CARE_PROVIDER_SITE_OTHER): Payer: Medicare Other | Admitting: *Deleted

## 2018-07-16 ENCOUNTER — Telehealth: Payer: Self-pay

## 2018-07-16 DIAGNOSIS — I495 Sick sinus syndrome: Secondary | ICD-10-CM | POA: Diagnosis not present

## 2018-07-16 LAB — CUP PACEART REMOTE DEVICE CHECK
Battery Impedance: 631 Ohm
Battery Remaining Longevity: 69 mo
Battery Voltage: 2.78 V
Brady Statistic AP VP Percent: 77 %
Brady Statistic AP VS Percent: 7 %
Brady Statistic AS VP Percent: 15 %
Brady Statistic AS VS Percent: 2 %
Date Time Interrogation Session: 20200406161111
Implantable Lead Implant Date: 20131126
Implantable Lead Implant Date: 20131126
Implantable Lead Location: 753859
Implantable Lead Location: 753860
Implantable Lead Model: 5076
Implantable Lead Model: 5092
Implantable Pulse Generator Implant Date: 20131126
Lead Channel Impedance Value: 433 Ohm
Lead Channel Impedance Value: 461 Ohm
Lead Channel Pacing Threshold Amplitude: 0.5 V
Lead Channel Pacing Threshold Amplitude: 1.125 V
Lead Channel Pacing Threshold Pulse Width: 0.4 ms
Lead Channel Pacing Threshold Pulse Width: 0.4 ms
Lead Channel Setting Pacing Amplitude: 2 V
Lead Channel Setting Pacing Amplitude: 2.5 V
Lead Channel Setting Pacing Pulse Width: 0.4 ms
Lead Channel Setting Sensing Sensitivity: 2 mV

## 2018-07-16 NOTE — Telephone Encounter (Signed)
Spoke with patient to remind of missed remote transmission 

## 2018-07-17 DIAGNOSIS — I10 Essential (primary) hypertension: Secondary | ICD-10-CM | POA: Diagnosis not present

## 2018-07-17 DIAGNOSIS — I130 Hypertensive heart and chronic kidney disease with heart failure and stage 1 through stage 4 chronic kidney disease, or unspecified chronic kidney disease: Secondary | ICD-10-CM | POA: Diagnosis not present

## 2018-07-17 DIAGNOSIS — E1169 Type 2 diabetes mellitus with other specified complication: Secondary | ICD-10-CM | POA: Diagnosis not present

## 2018-07-17 DIAGNOSIS — Z0000 Encounter for general adult medical examination without abnormal findings: Secondary | ICD-10-CM | POA: Diagnosis not present

## 2018-07-17 DIAGNOSIS — E039 Hypothyroidism, unspecified: Secondary | ICD-10-CM | POA: Diagnosis not present

## 2018-07-24 ENCOUNTER — Encounter: Payer: Self-pay | Admitting: Cardiology

## 2018-07-24 NOTE — Progress Notes (Signed)
Remote pacemaker transmission.   

## 2018-07-30 ENCOUNTER — Telehealth: Payer: Self-pay | Admitting: Physician Assistant

## 2018-07-30 NOTE — Telephone Encounter (Signed)
PHONE Visit on 08/06/2018. No smart phone. Call on home phone.    Phone call to obtain consent   -  07/30/2018         Virtual Visit Pre-Appointment Phone Call  Steps For Call:  1. Confirm consent - "In the setting of the current Covid19 crisis, you are scheduled for a (phone or video) visit with your provider on (date) at (time).  Just as we do with many in-office visits, in order for you to participate in this visit, we must obtain consent.  If you'd like, I can send this to your mychart (if signed up) or email for you to review.  Otherwise, I can obtain your verbal consent now.  All virtual visits are billed to your insurance company just like a normal visit would be.  By agreeing to a virtual visit, we'd like you to understand that the technology does not allow for your provider to perform an examination, and thus may limit your provider's ability to fully assess your condition. If your provider identifies any concerns that need to be evaluated in person, we will make arrangements to do so.  Finally, though the technology is pretty good, we cannot assure that it will always work on either your or our end, and in the setting of a video visit, we may have to convert it to a phone-only visit.  In either situation, we cannot ensure that we have a secure connection.  Are you willing to proceed?" STAFF: Did the patient verbally acknowledge consent to telehealth visit? Document YES/NO here: YES  2. Confirm the BEST phone number to call the day of the visit by including in appointment notes  3. Give patient instructions for WebEx/MyChart download to smartphone as below or Doximity/Doxy.me if video visit (depending on what platform provider is using)  4. Advise patient to be prepared with their blood pressure, heart rate, weight, any heart rhythm information, their current medicines, and a piece of paper and pen handy for any instructions they may receive the day of their visit  5. Inform  patient they will receive a phone call 15 minutes prior to their appointment time (may be from unknown caller ID) so they should be prepared to answer  6. Confirm that appointment type is correct in Epic appointment notes (VIDEO vs PHONE)     TELEPHONE CALL NOTE  [name] has been deemed a candidate for a follow-up tele-health visit to limit community exposure during the Covid-19 pandemic. I spoke with the patient via phone to ensure availability of phone/video source, confirm preferred email & phone number, and discuss instructions and expectations.  I reminded [name] to be prepared with any vital sign and/or heart rhythm information that could potentially be obtained via home monitoring, at the time of her visit. I reminded [name] to expect a phone call at the time of her visit if her visit.  [name] 07/30/2018 11:01 AM   INSTRUCTIONS FOR DOWNLOADING THE WEBEX APP TO SMARTPHONE  - If Apple, ask patient to go to App Store and type in WebEx in the search bar. Download [employer]Webex Meetings, the blue/green circle. If Android, go to [employer] Play Store and type in [employer] CompanyWebEx in the search bar. The app is free but as with any other app downloads, their phone may require them to verify saved payment information or Apple/Android password.  - The patient does NOT have to create an account. - On the day of the  visit, the assist will walk the patient through joining the meeting with the meeting number/password.  INSTRUCTIONS FOR DOWNLOADING THE MYCHART APP TO SMARTPHONE  - The patient must first make sure to have activated MyChart and know their login information - If Apple, go to [employer] and type in MyChart in the search bar and download the app. If Android, ask patient to go to [employer] and type in [place] in the search bar and download the app. The app is free but as with any other app downloads, their phone may require them to verify saved payment information or  Apple/Android password.  - The patient will need to then log into the app with their MyChart username and password, and select [facility] as their healthcare provider to link the account. When it is time for your visit, go to the MyChart app, find appointments, and click Begin Video Visit. Be sure to Select Allow for your device to access the Microphone and Camera for your visit. You will then be connected, and your provider will be with you shortly.  **If they have any issues connecting, or need assistance please contact MyChart service desk [phone])**  **If using a computer, in order to ensure the best quality for their visit they will need to use either of the following Internet Browsers: [employer], or Google Chrome**  IF USING DOXIMITY or DOXY.ME - The patient will receive a link just prior to their visit, either by text or email (to be determined day of appointment depending on if it's doxy.me or Doximity).     FULL LENGTH CONSENT FOR TELE-HEALTH VISIT   I hereby voluntarily request, consent and authorize [facility] and its employed or contracted physicians, physician assistants, nurse practitioners or other licensed health care professionals (the Practitioner), to provide me with telemedicine health care services (the Services") as deemed necessary by the treating Practitioner. I acknowledge and consent to receive the Services by the Practitioner via telemedicine. I understand that the telemedicine visit will involve communicating with the Practitioner through live audiovisual communication technology and the disclosure of certain medical information by electronic transmission. I acknowledge that I have been given the opportunity to request an in-person assessment or other available alternative prior to the telemedicine visit and am voluntarily participating in the telemedicine visit.  I understand that I have the right to withhold or withdraw my consent to the use  of telemedicine in the course of my care at any time, without affecting my right to future care or treatment, and that the Practitioner or I may terminate the telemedicine visit at any time. I understand that I have the right to inspect all information obtained and/or recorded in the course of the telemedicine visit and may receive copies of available information for a reasonable fee.  I understand that some of the potential risks of receiving the Services via telemedicine include:   Delay or interruption in medical evaluation due to technological equipment failure or disruption;  Information transmitted may not be sufficient (e.g. poor resolution of images) to allow for appropriate medical decision making by the Practitioner; and/or   In rare instances, security protocols could fail, causing a breach of personal health information.  Furthermore, I acknowledge that it is my responsibility to provide information about my medical history, conditions and care that is complete and accurate to the best of my ability. I acknowledge that Practitioner's advice, recommendations, and/or decision may be based on factors not within their control, such as  incomplete or inaccurate data provided by me or distortions of diagnostic images or specimens that may result from electronic transmissions. I understand that the practice of medicine is not an exact science and that Practitioner makes no warranties or guarantees regarding treatment outcomes. I acknowledge that I will receive a copy of this consent concurrently upon execution via email to the email address I last provided but may also request a printed copy by calling the office of [facility].    I understand that my insurance will be billed for this visit.   I have read or had this consent read to me.  I understand the contents of this consent, which adequately explains the benefits and risks of the Services being provided via telemedicine.   I have been  provided ample opportunity to ask questions regarding this consent and the Services and have had my questions answered to my satisfaction.  I give my informed consent for the services to be provided through the use of telemedicine in my medical care  By participating in this telemedicine visit I agree to the above.

## 2018-08-06 ENCOUNTER — Telehealth: Payer: Medicare Other | Admitting: Physician Assistant

## 2018-08-08 ENCOUNTER — Telehealth (INDEPENDENT_AMBULATORY_CARE_PROVIDER_SITE_OTHER): Payer: Medicare Other | Admitting: Physician Assistant

## 2018-08-08 ENCOUNTER — Encounter: Payer: Self-pay | Admitting: Physician Assistant

## 2018-08-08 ENCOUNTER — Other Ambulatory Visit: Payer: Self-pay

## 2018-08-08 VITALS — BP 154/84 | HR 63 | Ht 67.0 in | Wt 218.0 lb

## 2018-08-08 DIAGNOSIS — I2510 Atherosclerotic heart disease of native coronary artery without angina pectoris: Secondary | ICD-10-CM

## 2018-08-08 DIAGNOSIS — I10 Essential (primary) hypertension: Secondary | ICD-10-CM

## 2018-08-08 DIAGNOSIS — E785 Hyperlipidemia, unspecified: Secondary | ICD-10-CM

## 2018-08-08 DIAGNOSIS — I129 Hypertensive chronic kidney disease with stage 1 through stage 4 chronic kidney disease, or unspecified chronic kidney disease: Secondary | ICD-10-CM

## 2018-08-08 DIAGNOSIS — I5032 Chronic diastolic (congestive) heart failure: Secondary | ICD-10-CM

## 2018-08-08 DIAGNOSIS — Z950 Presence of cardiac pacemaker: Secondary | ICD-10-CM

## 2018-08-08 DIAGNOSIS — N183 Chronic kidney disease, stage 3 (moderate): Secondary | ICD-10-CM

## 2018-08-08 DIAGNOSIS — Z7189 Other specified counseling: Secondary | ICD-10-CM

## 2018-08-08 MED ORDER — AMLODIPINE BESYLATE 5 MG PO TABS: 10.0000 mg | ORAL_TABLET | Freq: Every day | ORAL | 3 refills | Status: DC

## 2018-08-08 NOTE — Progress Notes (Signed)
Virtual Visit via Telephone Note   This visit type was conducted due to national recommendations for restrictions regarding the COVID-19 Pandemic (e.g. social distancing) in an effort to limit this patient's exposure and mitigate transmission in our community.  Due to her co-morbid illnesses, this patient is at least at moderate risk for complications without adequate follow up.  This format is felt to be most appropriate for this patient at this time.  The patient did not have access to video technology/had technical difficulties with video requiring transitioning to audio format only (telephone).  All issues noted in this document were discussed and addressed.  No physical exam could be performed with this format.  Please refer to the patient's chart for her  consent to telehealth for [facility].   Evaluation Performed:  Follow-up visit  Date:  08/08/2018   ID:  [name], DOB 20-Jan-1933, MRN [id number]  Patient Location: Home Provider Location: Home  PCP:  Lucianne Lei, MD  Cardiologist:  Sherren Mocha, MD   Electrophysiologist:  Thompson Grayer, MD  PV: Dr. Fletcher Anon  Chief Complaint:  FU on CAD, CHF  History of Present Illness:    [name] is a 85 y.o. female with CAD status post CABG in 2011, sick sinus syndrome status post pacemaker, diastolic HF, HTN, diabetes, CKD, peripheral arterial disease. Cardiac catheterization in 11/2013 demonstrated patent bypass grafts.  She was last seen by Dr. Burt Knack in January 2020.  She was volume overloaded and Lasix was added to her medical regimen.  Her creatinine increased with this and it was changed to as needed.   Today, she notes she is doing well without chest pain, significant shortness of breath, syncope, paroxysmal nocturnal dyspnea.  She has some chronic leg swelling without change.  She has not taken Lasix in about a week.  She has not had any bleeding issues.   The patient does not have symptoms concerning for COVID-19  infection (fever, chills, cough, or new shortness of breath).    Past Medical History:  Diagnosis Date   Allergic rhinitis    Anemia    Anxiety    Barrett esophagus    CAD (coronary artery disease) 2009   a. Multivessel s/p PCI w/DES 2009 // b. s/p CABG 2011  //  c. LHC 8/15: pLAD 95 ISR, LCx 100, pOM1 40, dRCA 100, S-OM1/OM2 ok, S-D1 ok, S-PDA ok, L-LAD ok, EF 60%   Carotid artery disease ([place])    a. Carotid US 0/94: RICA 0-76%; LICA 80-88% >> FU 1 year  //  b. Carotid US 9/17: R 1-39%, L 40-59% >> FU 1 year   Chronic diastolic heart failure (HCC)    CKD (chronic kidney disease), stage II    GFR 60-89 ml/min   Depression    Disc disease, degenerative, cervical    Diverticulosis    Gastroparesis    GERD (gastroesophageal reflux disease)    Gout    H/O hiatal hernia    Helicobacter pylori gastritis    History of echocardiogram    a. Echo 11/13: EF 55% to 60%. Grade 2 diastolic dysfunction, MAC, trivial MR, mild LAE, normal RVSF, mild RAE, PASP 39 mmHg  //  b. Echo 4/17: EF 55-60%, normal wall motion, trivial AI, MAC, moderate LAE, mild RVE, PASP 35 mmHg   History of thrombocytopenia    HTN (hypertension)    Hyperlipidemia    Hypothyroidism    LBP (low back pain)    Lumbar disc disease/lumbar spinal stenosis  Morbid obesity ([place])    Myocardial infarction ([place])    Osteoarthritis    Osteopenia    PVD (peripheral vascular disease) ([place])    Sick sinus syndrome ([place])    MDT Dual-chamber PPM implant 02/2012   Type II or unspecified type diabetes mellitus without mention of complication, not stated as uncontrolled    Past Surgical History:  Procedure Laterality Date   ABDOMINAL HYSTERECTOMY     CARDIAC CATHETERIZATION     2011  DR COOPER (APPT NEXT WEEK)   CHOLECYSTECTOMY     CORONARY ARTERY BYPASS GRAFT  2011   LIMA-LAD, SVG-DIAG, SVG-OM1-OM2, SVG-PDA   CORONARY STENT PLACEMENT     Drug-eluting stent to the left anterior descending,  circumflex and right coronary artery in Jan 2009   EYE SURGERY     BIL CATARACT REMOVAL 06/2010   LEFT HEART CATHETERIZATION WITH CORONARY ANGIOGRAM N/A 12/02/2013   Procedure: LEFT HEART CATHETERIZATION WITH CORONARY ANGIOGRAM;  Surgeon: Sinclair Grooms, MD;  Location: [facility] CATH LAB;  Service: Cardiovascular;  Laterality: N/A;   OVARIAN CYST REMOVAL     PACEMAKER INSERTION  03/06/12   MDT Adapta L implanted by Dr Rayann Heman for SSS   PERMANENT PACEMAKER INSERTION N/A 03/06/2012   Procedure: PERMANENT PACEMAKER INSERTION;  Surgeon: Thompson Grayer, MD;  Location: [facility] CATH LAB;  Service: Cardiovascular;  Laterality: N/A;   SHOULDER ARTHROSCOPY  06/16/2011   Procedure: ARTHROSCOPY SHOULDER;  Surgeon: Sharmon Revere, MD;  Location: [place];  Service: Orthopedics;  Laterality: Left;  LEFT SHOULDER ARTHROSCOPY ACROMIALPLASTY, POSSIBLE MINI OPEN CUFF REPAIR    TUBAL LIGATION       Current Meds  Medication Sig   acetaminophen (TYLENOL) 500 MG tablet Take 500 mg by mouth every 6 (six) hours as needed for headache (pain).   allopurinol (ZYLOPRIM) 100 MG tablet Take 100 mg by mouth daily.   aspirin EC 81 MG tablet Take 81 mg by mouth daily.   atorvastatin (LIPITOR) 20 MG tablet TAKE 1 TABLET BY MOUTH  DAILY   azelastine (OPTIVAR) 0.05 % ophthalmic solution Place 1 drop into both eyes 2 (two) times daily.   carvedilol (COREG) 6.25 MG tablet TAKE 1 TABLET BY MOUTH TWO  TIMES DAILY   clopidogrel (PLAVIX) 75 MG tablet TAKE 1 TABLET BY MOUTH  DAILY   colchicine 0.6 MG tablet Take 0.6 mg by mouth daily as needed (gout).    furosemide (LASIX) 40 MG tablet Take 1 tablet (40 mg total) by mouth daily as needed for fluid or edema.   insulin NPH Human (HUMULIN N,NOVOLIN N) 100 UNIT/ML injection Inject 5-45 Units into the skin See admin instructions. Inject 45 units subcutaneously every morning and 10 units at night   levothyroxine (SYNTHROID, LEVOTHROID) 100 MCG tablet Take 1 tablet (100 mcg total) by  mouth daily.   Multiple Vitamin (MULTIVITAMIN WITH MINERALS) TABS tablet Take 1 tablet by mouth daily.   nitroGLYCERIN (NITROSTAT) 0.4 MG SL tablet DISSOLVE ONE TABLET UNDER THE TONGUE EVERY 5 MINUTES AS NEEDED FOR CHEST PAIN.  DO NOT EXCEED A TOTAL OF 3 DOSES IN 15 MINUTES   ONE TOUCH ULTRA TEST test strip 1 each by Other route daily as needed (blood sugar).    oxyCODONE-acetaminophen (PERCOCET/ROXICET) 5-325 MG tablet Take 1 tablet by mouth daily as needed (pain).    Polyvinyl Alcohol-Povidone (REFRESH OP) Place 1 drop into both eyes 4 (four) times daily as needed (dry eyes).   potassium chloride (K-DUR) 10 MEQ tablet Take 1 tablet (10  mEq total) by mouth daily as needed. Take only when you take Lasix.   sacubitril-valsartan (ENTRESTO) 97-103 MG Take 1 tablet by mouth 2 (two) times daily.   [DISCONTINUED] amLODipine (NORVASC) 5 MG tablet Take 5 mg by mouth 2 (two) times a day.   Current Facility-Administered Medications for the 08/08/18 encounter (Telemedicine) with Richardson Dopp T, PA-C  Medication   betamethasone acetate-betamethasone sodium phosphate (CELESTONE) injection 3 mg     Allergies:   Ciprofloxacin; Codeine; Hydrocodone; Penicillins; Shellfish allergy; Diltiazem hcl; Sulfonamide derivatives; Morphine and related; Lovastatin; and Metformin   Social History   Tobacco Use   Smoking status: Former Smoker   Smokeless tobacco: Never Used   Tobacco comment: quit 30 yrs ago  Substance Use Topics   Alcohol use: No   Drug use: No     Family Hx: The patient's family history includes Coronary artery disease in an other family member; Diabetes in her mother; Heart attack in her mother; Hypertension in her mother; Stroke in her father.  ROS:   Please see the history of present illness.    All other systems reviewed and are negative.   Prior CV studies:   The following studies were reviewed today:  Carotid US 01/29/2018 Bilateral ICA 1-39  Myoview 11/24/2015 EF  60, no ischemia, no infarct; low risk  Echocardiogram 07/13/2015 EF 55-60, normal wall motion, trivial AI, MAC, moderate LAE, PASP 35  Carotid US 12/12/13 R 1-39% L 40-59% FU 1 year   LHC 12/02/13 LM:   patent. LAD:  95% proximal in-stent restenosis. Diffuse disease throughout the remainder of the vessel that in the left ventricular apex. Reflux into the mammary is noted to reflux into the diagonal graft is also noted. LCx:  totally occluded.  40% stenosis proximal to the first obtuse marginal; the distal circumflex is totally occluded. RCA:  totally occluded distally. SVG to OM 1 and OM 2 is widely patent. SVG to diagonal #1 is widely patent. SVG to PDA is widely patent. LIMA to LAD is widely patent EF:  60%.   Echo 03/06/12 EF 55% to 60%. Grade 2 diastolic dysfunction, MAC, trivial MR, mild LAE, normal RVSF, mild RAE, PASP 39 mmHg   Myoview 05/2005 Normal study, EF 63%     Labs/Other Tests and Data Reviewed:    EKG:  No ECG reviewed.  Recent Labs: 02/04/2018: Hemoglobin 11.5; Platelets 196 05/21/2018: BUN 51; Creatinine, Ser 1.67; Potassium 4.1; Sodium 140   Recent Lipid Panel Lab Results  Component Value Date/Time   CHOL 149 05/11/2015 07:49 AM   TRIG 66 05/11/2015 07:49 AM   TRIG 192 (H) 04/20/2006 12:46 PM   HDL 97 05/11/2015 07:49 AM   CHOLHDL 1.5 05/11/2015 07:49 AM   LDLCALC 39 05/11/2015 07:49 AM   LDLDIRECT 82.5 09/11/2009 12:00 AM   From [employer]     Wt Readings from Last 3 Encounters:  08/08/18 218 lb (98.9 kg)  05/07/18 231 lb 6.4 oz (105 kg)  02/04/18 216 lb 0.8 oz (98 kg)     Objective:    Vital Signs:  BP (!) 154/84    Pulse 63    Ht _0  (1.702 m)    Wt 218 lb (98.9 kg)    BMI 34.14 kg/m    VITAL SIGNS:  reviewed GEN:  no acute distress RESPIRATORY:  no labored breathing during the conversation NEURO:  alert and oriented PSYCH:  she seems to be in good spirits  ASSESSMENT & PLAN:    Coronary  artery disease involving native coronary  artery of native heart without angina pectoris Hx of CABG in 2011.  She is not having angina.  Continue ASA, statin, beta-blocker.  Chronic diastolic heart failure (HCC) Volume status seems to be stable.  Continue Lasix as needed.   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  Her Lasix has since been changed to as needed.  Essential hypertension BP is above goal.  I have asked her to take the Amlodipine all at once daily (10 mg once daily instead of 5 mg twice daily).  I have asked her to monitor her BP and send me readings after 2 weeks.  If her BP remains above target, consider adding Hydralazine.  Hyperlipidemia, unspecified hyperlipidemia type LDL optimal on most recent lab work.  Continue current Rx.    Pacemaker FU with EP as planned.   COVID-19 Education: The signs and symptoms of COVID-19 were discussed with the patient and how to seek care for testing (follow up with PCP or arrange E-visit).  The importance of social distancing was discussed today.  Time:   Today, I have spent 11 minutes with the patient with telehealth technology discussing the above problems.     Medication Adjustments/Labs and Tests Ordered: Current medicines are reviewed at length with the patient today.  Concerns regarding medicines are outlined above.   Tests Ordered: No orders of the defined types were placed in this encounter.   Medication Changes: Meds ordered this encounter  Medications   amLODipine (NORVASC) 5 MG tablet    Sig: Take 2 tablets (10 mg total) by mouth daily.    Dispense:  90 tablet    Refill:  3    Order Specific Question:   Supervising Provider    Answer:   Lelon Perla [[id number]]    Disposition:  Follow up in 3 month(s)  Signed, Richardson Dopp, PA-C  08/08/2018 3:39 PM    [place]

## 2018-08-08 NOTE — Patient Instructions (Signed)
Medication Instructions:  Change how you take Norvasc - Take Norvasc (Amlodipine) 10 mg once daily (take both of the 5 mg tablets at the same time)  If you need a refill on your cardiac medications before your next appointment, please call your pharmacy.   Lab work: None   If you have labs (blood work) drawn today and your tests are completely normal, you will receive your results only by: Marland Kitchen MyChart Message (if you have MyChart) OR . A paper copy in the mail If you have any lab test that is abnormal or we need to change your treatment, we will call you to review the results.  Testing/Procedures: None   Follow-Up: At [facility], you and your health needs are our priority.  As part of our continuing mission to provide you with exceptional heart care, we have created designated Provider Care Teams.  These Care Teams include your primary Cardiologist (physician) and Advanced Practice Providers (APPs -  Physician Assistants and Nurse Practitioners) who all work together to provide you with the care you need, when you need it. You will need a follow up appointment in:  3 months.  Please call our office 2 months in advance to schedule this appointment.  You may see Tonny Bollman, MD or Tereso Newcomer, PA-C   Any Other Special Instructions Will Be Listed Below (If Applicable).  Check your blood pressure once a day for 2 weeks and send me those readings.

## 2018-09-03 ENCOUNTER — Telehealth: Payer: Self-pay | Admitting: Physician Assistant

## 2018-09-03 NOTE — Telephone Encounter (Signed)
1. Please check with patient to see if she was able to gather some BP readings for my review.  2. Please make sure she has a follow up appointment with me in July (3 mos follow up requested at appointment in April).  Tereso Newcomer, PA-C    09/03/2018 12:04 PM

## 2018-09-11 ENCOUNTER — Telehealth: Payer: Self-pay | Admitting: *Deleted

## 2018-09-11 DIAGNOSIS — I110 Hypertensive heart disease with heart failure: Secondary | ICD-10-CM | POA: Diagnosis not present

## 2018-09-11 DIAGNOSIS — E1169 Type 2 diabetes mellitus with other specified complication: Secondary | ICD-10-CM | POA: Diagnosis not present

## 2018-09-11 DIAGNOSIS — R5381 Other malaise: Secondary | ICD-10-CM | POA: Diagnosis not present

## 2018-09-11 NOTE — Telephone Encounter (Signed)
Lvm for patient to call back for appt to be scheduled

## 2018-10-02 ENCOUNTER — Other Ambulatory Visit: Payer: Self-pay

## 2018-10-08 ENCOUNTER — Other Ambulatory Visit: Payer: Self-pay

## 2018-10-08 NOTE — Patient Outreach (Signed)
[facility]) Care Management  10/08/2018  [name] 22-Apr-1932 [id number]   TELEPHONE SCREENING Referral date: 10/02/18 Referral source: EMMI prevent Referral reason: EMMI prevent score Insurance: [place] health care  Telephone call to patient regarding EMMI prevent referral. HIPAA verified with patient. RNCM introduced herself and explained reason for call. RNCM discussed and offered [facility] care management services. Patient verbally agreed to services.   HEALTH CONDITIONS:  Patient states she saw her primary MD approximately 3 weeks ago.  Patient states she has diabetes, COPD, heart failure, high blood pressure, a pacemaker, and Stage 3 Kidney disease.  Patient reports her last A1c was 7.0. She states her blood sugars have been as low as 68.  Patient states she becomes symptomatic when her blood sugars drop in this range and lower. Patient reports she feels nervous and feels like she will pass out.    SOCIAL SUPPORT:  Patient reports her daughter and son in law live with her. Patient states both her daughter and son in law are deaf. Patient states her grandson, Georgeann Oppenheim assists her with her ADL'S and takes her to her appointments.    MEDICATIONS; Patient states she takes her medications as prescribed.  She denies any difficulty affording her medications.  TRANSPORTATION: Patient states her grandson assists her with transportation and she has SCAT.  Patient states she uses a walker for ambulation. She reports it is difficult for her to transport with SCAT due to her having to use her walker and having several steps leading out of her home.   PSYCHOSOCIAL:  Patient reports having symptoms of depression at times due to various situations.  She denies taking any medication for depression. Patient states, "I don't do well with psychotropic drugs."  Patient denies having any counseling.  RNCM discussed and offered follow up with [facility] care management social worker. Patient  declined.   ASSESSMENT:  PHQ2 - 2,  PHQ9 - 2  PLAN: RNCM will refer patient to health coach  Quinn Plowman RN,BSN,CCM [facility] Telephonic  [phone]

## 2018-10-10 ENCOUNTER — Other Ambulatory Visit: Payer: Self-pay | Admitting: *Deleted

## 2018-10-15 ENCOUNTER — Ambulatory Visit (INDEPENDENT_AMBULATORY_CARE_PROVIDER_SITE_OTHER): Payer: Medicare Other | Admitting: *Deleted

## 2018-10-15 DIAGNOSIS — I495 Sick sinus syndrome: Secondary | ICD-10-CM

## 2018-10-16 ENCOUNTER — Telehealth: Payer: Self-pay

## 2018-10-16 LAB — CUP PACEART REMOTE DEVICE CHECK
Battery Impedance: 707 Ohm
Battery Remaining Longevity: 65 mo
Battery Voltage: 2.77 V
Brady Statistic AP VP Percent: 78 %
Brady Statistic AP VS Percent: 6 %
Brady Statistic AS VP Percent: 14 %
Brady Statistic AS VS Percent: 2 %
Date Time Interrogation Session: 20200707151130
Implantable Lead Implant Date: 20131126
Implantable Lead Implant Date: 20131126
Implantable Lead Location: 753859
Implantable Lead Location: 753860
Implantable Lead Model: 5076
Implantable Lead Model: 5092
Implantable Pulse Generator Implant Date: 20131126
Lead Channel Impedance Value: 433 Ohm
Lead Channel Impedance Value: 478 Ohm
Lead Channel Pacing Threshold Amplitude: 0.5 V
Lead Channel Pacing Threshold Amplitude: 1.125 V
Lead Channel Pacing Threshold Pulse Width: 0.4 ms
Lead Channel Pacing Threshold Pulse Width: 0.4 ms
Lead Channel Setting Pacing Amplitude: 2 V
Lead Channel Setting Pacing Amplitude: 2.5 V
Lead Channel Setting Pacing Pulse Width: 0.4 ms
Lead Channel Setting Sensing Sensitivity: 2 mV

## 2018-10-16 NOTE — Telephone Encounter (Signed)
Spoke with patient to remind of missed remote transmission 

## 2018-10-22 NOTE — Progress Notes (Signed)
Remote pacemaker transmission.   

## 2018-10-31 ENCOUNTER — Ambulatory Visit: Payer: Medicare Other | Admitting: Podiatry

## 2018-11-01 ENCOUNTER — Encounter: Payer: Self-pay | Admitting: *Deleted

## 2018-11-01 ENCOUNTER — Other Ambulatory Visit: Payer: Self-pay | Admitting: *Deleted

## 2018-11-01 NOTE — Patient Outreach (Signed)
[facility](THN) Care Management  [facility] Care [facility]  11/01/2018   Samantha SneddonShirley A [name] January 13, 1933 [id number]   RN Health Coach Initial Assessment  Referral Date:  10/08/2018 Referral Source:  EMMI Prevent Screening Reason for Referral:  Disease Management Education Insurance:  [employer] Medicare   Outreach Attempt:  Successful telephone outreach to patient for introduction and initial telephone assessment.  HIPAA verified with patient.  RN Health Coach introduced self and role.  Patient verbally agrees to Disease Management outreaches.  Patient completed initial telephone assessment.  Social:  Patient lives at home with daughter, son in law, and 2 grandsons.  Reports her grandson, [name] is her primary caregiver and assist her with ADLs (bathing and dressing) and he along with other family perform her IADLs.  Ambulates with Rolator walker and denies any falls in the past year.  States her grandson transports her to her medical appointments or she uses [facility].  DME in the home include:  Rolator walker, full upper dentures, partial lower dentures, scale, CBG meter, blood pressure cuff, eyeglasses, and tub transfer bench.  Patient reporting her grandson's job has several +COVID 19 cases and grandson is awaiting his test results.  Conditions:   Per chart review and discussion with patient, PMH include but not limited to:  Diabetes, coronary artery disease status post coronary artery bypass grafting, sick sinus syndrome with pacemaker placement, diastolic heart failure, hypertension, chronic kidney disease, anemia, anxiety, barrett esophagus, carotid artery disease, depression, GERD, gout, hiatal hernia, hypothyroidism, hyperlipidemia, cholecystectomy, bilateral cataract removals, and peripheral arterial disease.  Denies any recent emergency room visits or hospitalizations.  Reports monitoring vital signs daily and sending them through [employer] Tablet, but tablet seems to be  broken this morning.  Did not weigh this morning, but last weight was 213 pounds with weight ranging 213-220 pounds.  Monitors blood sugars about twice a day.  Fasting blood sugar this morning was 97 with fasting ranges of 90-100's.  Hgb A1C is 6.5 on 09/11/2018.  Reports shortness of breath with exertion only but does have lower extremity edema bilaterally (chronically).  States she takes her PRN lasix at least twice a week.  Medications:  Patient reports taking at least 10 medications a day.  Reports managing her medications herself with some assistance from her grandson using a weekly pill box fill.  Denies any issues affording her medications.  Encounter Medications:  Outpatient Encounter Medications as of 11/01/2018  Medication Sig  . acetaminophen (TYLENOL) 500 MG tablet Take 500 mg by mouth every 6 (six) hours as needed for headache (pain).  Marland Kitchen. allopurinol (ZYLOPRIM) 100 MG tablet Take 100 mg by mouth daily.  Marland Kitchen. amLODipine (NORVASC) 5 MG tablet Take 2 tablets (10 mg total) by mouth daily.  Marland Kitchen. aspirin EC 81 MG tablet Take 81 mg by mouth daily.  Marland Kitchen. atorvastatin (LIPITOR) 20 MG tablet TAKE 1 TABLET BY MOUTH  DAILY  . azelastine (OPTIVAR) 0.05 % ophthalmic solution Place 1 drop into both eyes 2 (two) times daily.  . carvedilol (COREG) 6.25 MG tablet TAKE 1 TABLET BY MOUTH TWO  TIMES DAILY  . clopidogrel (PLAVIX) 75 MG tablet TAKE 1 TABLET BY MOUTH  DAILY  . colchicine 0.6 MG tablet Take 0.6 mg by mouth daily as needed (gout).   . furosemide (LASIX) 40 MG tablet Take 1 tablet (40 mg total) by mouth daily as needed for fluid or edema.  . insulin NPH Human (HUMULIN N,NOVOLIN N) 100 UNIT/ML injection Inject 5-45 Units into the  skin See admin instructions. Inject 45 units subcutaneously every morning and 10 units at night  . levothyroxine (SYNTHROID, LEVOTHROID) 100 MCG tablet Take 1 tablet (100 mcg total) by mouth daily.  . Multiple Vitamin (MULTIVITAMIN WITH MINERALS) TABS tablet Take 1 tablet by mouth  daily.  . nitroGLYCERIN (NITROSTAT) 0.4 MG SL tablet DISSOLVE ONE TABLET UNDER THE TONGUE EVERY 5 MINUTES AS NEEDED FOR CHEST PAIN.  DO NOT EXCEED A TOTAL OF 3 DOSES IN 15 MINUTES  . ONE TOUCH ULTRA TEST test strip 1 each by Other route daily as needed (blood sugar).   Marland Kitchen oxyCODONE-acetaminophen (PERCOCET/ROXICET) 5-325 MG tablet Take 1 tablet by mouth daily as needed (pain).   . Polyvinyl Alcohol-Povidone (REFRESH OP) Place 1 drop into both eyes 4 (four) times daily as needed (dry eyes).  . potassium chloride (K-DUR) 10 MEQ tablet Take 1 tablet (10 mEq total) by mouth daily as needed. Take only when you take Lasix.  Marland Kitchen sacubitril-valsartan (ENTRESTO) 97-103 MG Take 1 tablet by mouth 2 (two) times daily.   Facility-Administered Encounter Medications as of 11/01/2018  Medication  . betamethasone acetate-betamethasone sodium phosphate (CELESTONE) injection 3 mg    Functional Status:  In your present state of health, do you have any difficulty performing the following activities: 11/01/2018  Hearing? Y  Comment difficult hearing at times  Vision? N  Difficulty concentrating or making decisions? N  Walking or climbing stairs? Y  Comment difficulties climbing stairs  Dressing or bathing? Y  Comment needs assistance bathing  Doing errands, shopping? Y  Comment family usually does Engineer, drilling and eating ? Y  Comment family cooks  Using the Toilet? N  In the past six months, have you accidently leaked urine? N  Do you have problems with loss of bowel control? N  Managing your Medications? N  Managing your Finances? N  Housekeeping or managing your Housekeeping? Y  Comment family does cleaning  Some recent data might be hidden    Fall/Depression Screening: Fall Risk  11/01/2018  Falls in the past year? 0  Risk for fall due to : Medication side effect;Impaired balance/gait;Impaired mobility;Impaired vision  Follow up Falls evaluation completed;Education provided;Falls  prevention discussed   PHQ 2/9 Scores 11/01/2018 10/08/2018  PHQ - 2 Score 2 2  PHQ- 9 Score 2 2   [facility] CM Care Plan Problem One     Most Recent Value  Care Plan Problem One  Knowledge deficiet related to diabetes.  Role Documenting the Problem One  [place] for Problem One  Active  THN Long Term Goal   Patient will maintaing Hgb A1C of 7 or below within the next 90 days.  THN Long Term Goal Start Date  11/01/18  Interventions for Problem One Long Term Goal  Current care plan and goals reviewed and discussed with patient, encouraged patient to reschedule today's missed appointment with primary care provider as soon as possible, encouraged patient to continue daily weight monitoring and monitoring blood sugars at least twice a day, reviewed signs and symptoms of hypo and hyperglycemia, sending Living Well with Diabetes Educational Booklet, sending 2020 Calendar Booklet to assist with tracking appointments and documenting vitals,  [facility] CM Short Term Goal #1   Patient will contact [facility] concerning malfunctioning vital sign tablet within the next week.  [facility] CM Short Term Goal #1 Start Date  11/01/18  Interventions for Short Term Goal #1  Discussed with patient [employer] tablet and monitoring of blood pressure,  weight, and blood sugar, patient stating tablet will not turn on for the past 2 days, encouraged patient to contact [employer] to get help trouble shooting tablet     Appointments:  Reports last attending appointment with primary care provider, Dr. Parke SimmersBland on 09/11/2018 and had a follow up appointment today but had to cancel per primary care based on grandson awaiting COVID testing results.  States she is to reschedule appointment as soon as she can after grandson receives test results.  Advanced Directives:  Patient reports having a Living Will and Health Care Power of Attorney and does not wish to make any changes at this time.   Consent:  [facility] services reviewed and  discussed.  Patient verbally agrees to Disease Management outreaches.  Plan: RN Health Coach will send primary MD barriers letter. RN Health Coach will route initial telephone assessment note to primary MD. RN Health Coach will send patient [facility] Welcome Packet. RN Health Coach will send patient 2020 Calendar Booklet. RN Health Coach will send patient Living Well with Diabetes Educational Packet. RN Health Coach will make next telephone outreach to patient within the month of August.  Rhae LernerFarrah Lafawn Lenoir RN [facility] Care Management  RN Health Coach [phone] [email or web address]

## 2018-11-07 ENCOUNTER — Telehealth: Payer: Self-pay | Admitting: Cardiovascular Disease

## 2018-11-07 NOTE — Telephone Encounter (Signed)
Agree with extra Lasix/K+ for 3 days. Collect BP readings over the next 1 week and send for review. Richardson Dopp, PA-C    11/07/2018 5:12 PM

## 2018-11-07 NOTE — Telephone Encounter (Signed)
Spoke with patient who states the [facility] nurse told her she was going to call us to let us know about weight gain of 6 lb in one day. She denies change in diet. States she just took Lasix 40 mg and prior to today last dose was 4-5 days ago Denies SOB, chest discomfort; states "I feel pretty good today."  BP today 159/78; states it has been elevated recently. She was advised in April to report BP readings to Richardson Dopp, [place] and to make a follow-up appointment which was not done.  I advised her to take Lasix 40 mg and Kdur 10 mEq for the next 3 days and that I will call her back on Friday to assess her weight loss. I advised her to call back to the office prior to Friday if she does not have good urine output or if weight increases. She verbalized understanding and agreement. She states she needs to make an appointment and I advised that I will schedule her appointment accordingly after our discussion on Friday. She thanked me for the call.

## 2018-11-07 NOTE — Telephone Encounter (Signed)
New message:    Ariella from [employer] calling to report patient weight patinet  was 213 yesterfay and 219 today she gained almost 6 pounds. Patient takes PRN water and patient took one today. Please call patient.

## 2018-11-09 NOTE — Telephone Encounter (Signed)
Called patient to assess her condition since she increased her Lasix over past 3 days. She reports weight loss of 6 lb but states BP remains high. She states she feels well except for having to go to the bathroom so frequently. I advised her to reduce Lasix to every other day. She reports BP readings today of: 179/86 mmHg and 174/76 mmHg after morning medications. She denies high sodium diet. I scheduled her to see Richardson Dopp, PA on Wed. 8/5 and advised her to bring her BP cuff to compare with ours.  She verbalized understanding and agreement with plan of care and thanked me for the call.       COVID-19 Pre-Screening Questions:  . In the past 7 to 10 days have you had a cough,  shortness of breath, headache, congestion, fever (100 or greater) body aches, chills, sore throat, or sudden loss of taste or sense of smell? NO . Have you been around anyone with known Covid 19. NO . Have you been around anyone who is awaiting Covid 19 test results in the past 7 to 10 days? NO . Have you been around anyone who has been exposed to Covid 19, or has mentioned symptoms of Covid 19 within the past 7 to 10 days? YES but her grandson, who was exposed to a co-worker w/Covid, tested negative  If you have any concerns/questions about symptoms patients report during screening (either on the phone or at threshold). Contact the provider seeing the patient or DOD for further guidance.  If neither are available contact a member of the leadership team.

## 2018-11-13 NOTE — Progress Notes (Signed)
Cardiology Office Note:    Date:  11/14/2018   ID:  [name], DOB 07-14-1932, MRN [id number]  PCP:  Lucianne Lei, MD  Cardiologist:  Sherren Mocha, MD  Electrophysiologist:  Thompson Grayer, MD   Referring MD: Lucianne Lei, MD   Chief Complaint  Patient presents with  . Follow-up    CHF    History of Present Illness:    [name] is a 85 y.o. female with:  CAD  S/p CABG in 2011  Cardiac catheterization 8/15:  Patent Grafts  SSS s/p PPM  Diastolic CHF  Hypertension  Diabetes mellitus   Chronic kidney disease   PAD  Ms. Mcfarland was last seen via Telemedicine 08/08/2018.  She called in recently with weight gain and we adjusted her Lasix.  She returns for evaluation.  She is here alone.  Her weight is come down about 8 pounds.  In looking through her chart, her weight was 231 in January and is 212 today.  She continues to have significant leg swelling.  This is present in the mornings and does get worse throughout the day.  She has not had chest discomfort.  She has not had significant shortness of breath.  She sleeps on 3 pillows chronically.  She has not had syncope.  Prior CV studies:   The following studies were reviewed today:  Carotid US 01/29/2018 Bilateral ICA 1-39  Myoview 11/24/2015 EF 60, no ischemia, no infarct; low risk  Echocardiogram 07/13/2015 EF 55-60, normal wall motion, trivial AI, MAC, moderate LAE, PASP 35  Carotid US 12/12/13 R 1-39% L 40-59% FU 1 year  [facility] 12/02/13 CH:ENIDPO. LAD:95% proximal in-stent restenosis. Diffuse disease throughout the remainder of the vessel that in the left ventricular apex. Reflux into the mammary is noted to reflux into the diagonal graft is also noted. EUM:PNTIRWE occluded.40% stenosis proximal to the first obtuse marginal; the distal circumflex is totally occluded. RXV:QMGQQPY occluded distally. SVG to OM 1 and OM 2 is widely patent. SVG to diagonal #1 is widely patent. SVG to PDA is  widely patent. LIMA to LAD is widely patent EF:60%.  Echo 03/06/12 EF 55% to 60%. Grade 2 diastolic dysfunction, MAC, trivial MR, mild LAE, normal RVSF, mild RAE, PASP 39 mmHg  Myoview 05/2005 Normal study, EF 63%  Past Medical History:  Diagnosis Date  . Allergic rhinitis   . Anemia   . Anxiety   . Barrett esophagus   . CAD (coronary artery disease) 2009   a. Multivessel s/p PCI w/DES 2009 // b. s/p CABG 2011  //  c. LHC 8/15: pLAD 95 ISR, LCx 100, pOM1 40, dRCA 100, S-OM1/OM2 ok, S-D1 ok, S-PDA ok, L-LAD ok, EF 60%  . Carotid artery disease ([place])    a. Carotid US 1/95: RICA 0-93%; LICA 26-71% >> FU 1 year  //  b. Carotid US 9/17: R 1-39%, L 40-59% >> FU 1 year  . Chronic diastolic heart failure ([place])   . CKD (chronic kidney disease), stage II    GFR 60-89 ml/min  . Depression   . Disc disease, degenerative, cervical   . Diverticulosis   . Gastroparesis   . GERD (gastroesophageal reflux disease)   . Gout   . H/O hiatal hernia   . Helicobacter pylori gastritis   . History of echocardiogram    a. Echo 11/13: EF 55% to 60%. Grade 2 diastolic dysfunction, MAC, trivial MR, mild LAE, normal RVSF, mild RAE, PASP 39 mmHg  //  b. Echo 4/17:  EF 55-60%, normal wall motion, trivial AI, MAC, moderate LAE, mild RVE, PASP 35 mmHg  . History of thrombocytopenia   . HTN (hypertension)   . Hyperlipidemia   . Hypothyroidism   . LBP (low back pain)    Lumbar disc disease/lumbar spinal stenosis  . Morbid obesity ([place])   . Myocardial infarction ([place])   . Osteoarthritis   . Osteopenia   . PVD (peripheral vascular disease) ([place])   . Sick sinus syndrome [facility])    MDT Dual-chamber PPM implant 02/2012  . Type II or unspecified type diabetes mellitus without mention of complication, not stated as uncontrolled    Surgical Hx: The patient  has a past surgical history that includes Coronary stent placement; Cholecystectomy; Abdominal hysterectomy; Tubal ligation; Ovarian cyst removal; Coronary  artery bypass graft (2011); Eye surgery; Shoulder arthroscopy (06/16/2011); Cardiac catheterization; Pacemaker insertion (03/06/12); permanent pacemaker insertion (N/A, 03/06/2012); and left heart catheterization with coronary angiogram (N/A, 12/02/2013).   Current Medications: Current Meds  Medication Sig  . acetaminophen (TYLENOL) 500 MG tablet Take 500 mg by mouth every 6 (six) hours as needed for headache (pain).  Marland Kitchen allopurinol (ZYLOPRIM) 100 MG tablet Take 100 mg by mouth daily.  Marland Kitchen amLODipine (NORVASC) 5 MG tablet Take 1 tablet (5 mg total) by mouth daily.  Marland Kitchen aspirin EC 81 MG tablet Take 81 mg by mouth daily.  Marland Kitchen atorvastatin (LIPITOR) 20 MG tablet TAKE 1 TABLET BY MOUTH  DAILY  . azelastine (OPTIVAR) 0.05 % ophthalmic solution Place 1 drop into both eyes 2 (two) times daily.  . carvedilol (COREG) 6.25 MG tablet TAKE 1 TABLET BY MOUTH TWO  TIMES DAILY  . chlorthalidone (HYGROTON) 25 MG tablet Take 1 tablet by mouth daily.  . clopidogrel (PLAVIX) 75 MG tablet TAKE 1 TABLET BY MOUTH  DAILY  . colchicine 0.6 MG tablet Take 0.6 mg by mouth daily as needed (gout).   . furosemide (LASIX) 40 MG tablet Only take Mon, Wed and Friday with Potassium.  . insulin NPH Human (HUMULIN N,NOVOLIN N) 100 UNIT/ML injection Inject 5-45 Units into the skin See admin instructions. Inject 45 units subcutaneously every morning and 10 units at night  . levothyroxine (SYNTHROID, LEVOTHROID) 100 MCG tablet Take 1 tablet (100 mcg total) by mouth daily.  . Multiple Vitamin (MULTIVITAMIN WITH MINERALS) TABS tablet Take 1 tablet by mouth daily.  . nitroGLYCERIN (NITROSTAT) 0.4 MG SL tablet DISSOLVE ONE TABLET UNDER THE TONGUE EVERY 5 MINUTES AS NEEDED FOR CHEST PAIN.  DO NOT EXCEED A TOTAL OF 3 DOSES IN 15 MINUTES  . ONE TOUCH ULTRA TEST test strip 1 each by Other route daily as needed (blood sugar).   Marland Kitchen oxyCODONE-acetaminophen (PERCOCET/ROXICET) 5-325 MG tablet Take 1 tablet by mouth daily as needed (pain).   . Polyvinyl  Alcohol-Povidone (REFRESH OP) Place 1 drop into both eyes 4 (four) times daily as needed (dry eyes).  . potassium chloride (K-DUR) 10 MEQ tablet Only take Mon, Wed, and Friday with lasix.  Marland Kitchen sacubitril-valsartan (ENTRESTO) 97-103 MG Take 1 tablet by mouth 2 (two) times daily.  . [DISCONTINUED] amLODipine (NORVASC) 5 MG tablet Take 2 tablets (10 mg total) by mouth daily.  . [DISCONTINUED] furosemide (LASIX) 40 MG tablet Take 1 tablet (40 mg total) by mouth daily as needed for fluid or edema.  . [DISCONTINUED] potassium chloride (K-DUR) 10 MEQ tablet Take 1 tablet (10 mEq total) by mouth daily as needed. Take only when you take Lasix.   Current Facility-Administered Medications for the 11/14/18  encounter (Office Visit) with Liliane Shi, PA-C  Medication  . betamethasone acetate-betamethasone sodium phosphate (CELESTONE) injection 3 mg     Allergies:   Ciprofloxacin, Codeine, Hydrocodone, Penicillins, Shellfish allergy, Diltiazem hcl, Sulfonamide derivatives, Morphine and related, Lovastatin, and Metformin   Social History   Tobacco Use  . Smoking status: Former Research scientist (life sciences)  . Smokeless tobacco: Never Used  . Tobacco comment: quit 30 yrs ago  Substance Use Topics  . Alcohol use: No  . Drug use: No     Family Hx: The patient's family history includes Coronary artery disease in an other family member; Diabetes in her mother; Heart attack in her mother; Hypertension in her mother; Stroke in her father.  ROS:   Please see the history of present illness.    ROS All other systems reviewed and are negative.   EKGs/Labs/Other Test Reviewed:    EKG:  EKG is not ordered today.  The ekg ordered today demonstrates n/a  Recent Labs: 02/04/2018: Hemoglobin 11.5; Platelets 196 05/21/2018: BUN 51; Creatinine, Ser 1.67; Potassium 4.1; Sodium 140   Recent Lipid Panel Lab Results  Component Value Date/Time   CHOL 149 05/11/2015 07:49 AM   TRIG 66 05/11/2015 07:49 AM   TRIG 192 (H) 04/20/2006  12:46 PM   HDL 97 05/11/2015 07:49 AM   CHOLHDL 1.5 05/11/2015 07:49 AM   LDLCALC 39 05/11/2015 07:49 AM   LDLDIRECT 82.5 09/11/2009 12:00 AM     Physical Exam:    VS:  BP (!) 114/44   Pulse 62   Ht _0  (1.702 m)   Wt 212 lb (96.2 kg)   SpO2 99%   BMI 33.20 kg/m     Wt Readings from Last 3 Encounters:  11/14/18 212 lb (96.2 kg)  08/08/18 218 lb (98.9 kg)  05/07/18 231 lb 6.4 oz (105 kg)     Physical Exam  Constitutional: She is oriented to person, place, and time. She appears well-developed and well-nourished. No distress.  HENT:  Head: Normocephalic and atraumatic.  Eyes: No scleral icterus.  Neck: No thyromegaly present.  Cardiovascular: Normal rate, regular rhythm and normal heart sounds.  No murmur heard. Pulmonary/Chest: Effort normal and breath sounds normal. She has no rales.  Abdominal: Soft. There is no hepatomegaly.  Musculoskeletal:        General: Edema (1-2+ bilat LE edema) present.  Lymphadenopathy:    She has no cervical adenopathy.  Neurological: She is alert and oriented to person, place, and time.  Skin: Skin is warm and dry.  Psychiatric: She has a normal mood and affect.    ASSESSMENT & PLAN:    1. Chronic diastolic heart failure (HCC) 2. Leg swelling From a heart failure standpoint, she seems to be improved.  Her lungs are clear.  She is sitting at 90 degrees but I do not see any evidence of JVD.  There is no HJR.  She continues to have significant leg swelling.  I suspect some of her leg swelling may be related to side effects from amlodipine.  Her blood pressure is well controlled.  I suspect that she would be able to tolerate a lower dose of amlodipine.  If not, we can certainly adjust 1 of her other medications.  -Change Lasix/K+ to every Monday, Wednesday, Friday  -Decrease amlodipine to 5 mg daily  -Obtain BMET, CBC today  -Follow-up 1 month  3. Coronary artery disease involving native coronary artery of native heart without angina  pectoris History of CABG in 2011.  She had patent bypass grafts by cardiac catheterization 2015.  Myoview in 2017 was low risk.  She is not having anginal symptoms.  Continue aspirin, clopidogrel, atorvastatin.  4. Essential hypertension Her blood pressure is controlled.  However, I do think she could tolerate a lower dose of amlodipine.  If her pressure starts to increase, we could consider increasing her carvedilol.  5. CKD (chronic kidney disease) stage 3, GFR 30-59 ml/min (HCC) Repeat BMET will be obtained today.   Dispo:  Return in about 1 month (around 12/15/2018) for Close Follow Up, w/ Dr. Burt Knack, or Richardson Dopp, PA-C.   Medication Adjustments/Labs and Tests Ordered: Current medicines are reviewed at length with the patient today.  Concerns regarding medicines are outlined above.  Tests Ordered: Orders Placed This Encounter  Procedures  . Basic Metabolic Panel (BMET)  . CBC w/Diff   Medication Changes: Meds ordered this encounter  Medications  . amLODipine (NORVASC) 5 MG tablet    Sig: Take 1 tablet (5 mg total) by mouth daily.    Dispense:  90 tablet    Refill:  3  . furosemide (LASIX) 40 MG tablet    Sig: Only take Mon, Wed and Friday with Potassium.    Dispense:  30 tablet    Refill:  11  . potassium chloride (K-DUR) 10 MEQ tablet    Sig: Only take Mon, Wed, and Friday with lasix.    Dispense:  30 tablet    Refill:  [address] Richardson Dopp, [place]  11/14/2018 10:58 AM    [facility] [place], [place], [place]  [address] Phone: [phone]; Fax: [phone]

## 2018-11-14 ENCOUNTER — Encounter: Payer: Self-pay | Admitting: Physician Assistant

## 2018-11-14 ENCOUNTER — Ambulatory Visit (INDEPENDENT_AMBULATORY_CARE_PROVIDER_SITE_OTHER): Payer: Medicare Other | Admitting: Physician Assistant

## 2018-11-14 ENCOUNTER — Other Ambulatory Visit: Payer: Self-pay

## 2018-11-14 VITALS — BP 114/44 | HR 62 | Ht 67.0 in | Wt 212.0 lb

## 2018-11-14 DIAGNOSIS — I5032 Chronic diastolic (congestive) heart failure: Secondary | ICD-10-CM

## 2018-11-14 DIAGNOSIS — N183 Chronic kidney disease, stage 3 unspecified: Secondary | ICD-10-CM

## 2018-11-14 DIAGNOSIS — I10 Essential (primary) hypertension: Secondary | ICD-10-CM

## 2018-11-14 DIAGNOSIS — I2510 Atherosclerotic heart disease of native coronary artery without angina pectoris: Secondary | ICD-10-CM

## 2018-11-14 DIAGNOSIS — M7989 Other specified soft tissue disorders: Secondary | ICD-10-CM | POA: Diagnosis not present

## 2018-11-14 LAB — BASIC METABOLIC PANEL
BUN/Creatinine Ratio: 22 (ref 12–28)
BUN: 36 mg/dL — ABNORMAL HIGH (ref 8–27)
CO2: 25 mmol/L (ref 20–29)
Calcium: 9.2 mg/dL (ref 8.7–10.3)
Chloride: 102 mmol/L (ref 96–106)
Creatinine, Ser: 1.64 mg/dL — ABNORMAL HIGH (ref 0.57–1.00)
GFR calc Af Amer: 33 mL/min/{1.73_m2} — ABNORMAL LOW (ref >59)
GFR calc non Af Amer: 29 mL/min/{1.73_m2} — ABNORMAL LOW (ref >59)
Glucose: 166 mg/dL — ABNORMAL HIGH (ref 65–99)
Potassium: 4.1 mmol/L (ref 3.5–5.2)
Sodium: 142 mmol/L (ref 134–144)

## 2018-11-14 LAB — CBC WITH DIFFERENTIAL/PLATELET
Basophils Absolute: 0 10*3/uL (ref 0.0–0.2)
Basos: 0 %
EOS (ABSOLUTE): 0.2 10*3/uL (ref 0.0–0.4)
Eos: 2 %
Hematocrit: 33.3 % — ABNORMAL LOW (ref 34.0–46.6)
Hemoglobin: 10.9 g/dL — ABNORMAL LOW (ref 11.1–15.9)
Immature Grans (Abs): 0 10*3/uL (ref 0.0–0.1)
Immature Granulocytes: 0 %
Lymphocytes Absolute: 1.7 10*3/uL (ref 0.7–3.1)
Lymphs: 16 %
MCH: 30 pg (ref 26.6–33.0)
MCHC: 32.7 g/dL (ref 31.5–35.7)
MCV: 92 fL (ref 79–97)
Monocytes Absolute: 0.8 10*3/uL (ref 0.1–0.9)
Monocytes: 8 %
Neutrophils Absolute: 7.5 10*3/uL — ABNORMAL HIGH (ref 1.4–7.0)
Neutrophils: 74 %
Platelets: 215 10*3/uL (ref 150–450)
RBC: 3.63 x10E6/uL — ABNORMAL LOW (ref 3.77–5.28)
RDW: 12.5 % (ref 11.7–15.4)
WBC: 10.2 10*3/uL (ref 3.4–10.8)

## 2018-11-14 MED ORDER — POTASSIUM CHLORIDE ER 10 MEQ PO TBCR: EXTENDED_RELEASE_TABLET | ORAL | 11 refills | Status: DC

## 2018-11-14 MED ORDER — AMLODIPINE BESYLATE 5 MG PO TABS: 5.0000 mg | ORAL_TABLET | Freq: Every day | ORAL | 3 refills | Status: DC

## 2018-11-14 MED ORDER — FUROSEMIDE 40 MG PO TABS: ORAL_TABLET | ORAL | 11 refills | Status: DC

## 2018-11-14 NOTE — Patient Instructions (Addendum)
Medication Instructions:  Your physician has recommended you make the following change in your medication:  1.  Take Lasix  one tablet (40 mg ) Monday, Wednesday, and Friday Only 2.  Take potassium one tablet ( 10 meq) Monday, Wednesday and Friday Only 3. Decrease Amlodipine one tablet (5mg  ) daily.  Labwork: Your physician recommends that you have lab work today: bmet/cbd   Testing/Procedures: -None  Follow-Up: Your physician recommends that you keep your  Scheduled  follow-up appointment with Richardson Dopp, PA   Any Other Special Instructions Will Be Listed Below (If Applicable).     If you need a refill on your cardiac medications before your next appointment, please call your pharmacy.

## 2018-11-16 ENCOUNTER — Encounter: Payer: Self-pay | Admitting: Podiatry

## 2018-11-16 ENCOUNTER — Other Ambulatory Visit: Payer: Self-pay

## 2018-11-16 ENCOUNTER — Telehealth: Payer: Self-pay | Admitting: *Deleted

## 2018-11-16 ENCOUNTER — Ambulatory Visit (INDEPENDENT_AMBULATORY_CARE_PROVIDER_SITE_OTHER): Payer: Medicare Other | Admitting: Podiatry

## 2018-11-16 VITALS — Temp 97.6°F

## 2018-11-16 DIAGNOSIS — Z79899 Other long term (current) drug therapy: Secondary | ICD-10-CM

## 2018-11-16 DIAGNOSIS — B351 Tinea unguium: Secondary | ICD-10-CM

## 2018-11-16 DIAGNOSIS — M79676 Pain in unspecified toe(s): Secondary | ICD-10-CM

## 2018-11-16 NOTE — Telephone Encounter (Signed)
-----   Message from Liliane Shi, [place] sent at 11/14/2018  5:27 PM EDT ----- Creatinine stable.  BUN improved.  Potassium normal.  Hemoglobin stable. PLAN:   - Continue current medications and follow up as planned.   - Repeat BMET 2-weeks Richardson Dopp, PA-C    11/14/2018 5:26 PM

## 2018-11-16 NOTE — Progress Notes (Signed)
Patient presents to the office for long thick painful nails  both feet.  She is unable to self treat.  She says she has pain walking and wearing her shoes.  She says the corn second toe left is better.  She presents to the office for preventative foot care services.  General Appearance  Alert, conversant and in no acute stress.  Vascular  Dorsalis pedis and posterior pulses are palpable  bilaterally.  Capillary return is within normal limits  Bilaterally. Temperature is within normal limits  Bilaterally  Neurologic  Senn-Weinstein monofilament wire test within normal limits  bilaterally. Muscle power  Within normal limits bilaterally.  Nails Thick disfigured discolored nails with subungual debride bilaterally from hallux to fifth toes bilaterally. No evidence of bacterial infection or drainage bilaterally.  Orthopedic  No limitations of motion of motion feet bilaterally.  No crepitus or effusions noted.  No bony pathology or digital deformities noted. Second toe is dislocated at the PIPJ.  Skin  normotropic skin with no porokeratosis noted bilaterally.    No redness, swelling or drainage noted at the site of the skin lesion.   Onychomycosis  B/L  Skin lesion second toe left foot.   ROV  Debridement of nails x 10.   RTC 3 months.   Gardiner Barefoot DPM

## 2018-11-27 ENCOUNTER — Encounter: Payer: Self-pay | Admitting: *Deleted

## 2018-11-27 ENCOUNTER — Other Ambulatory Visit: Payer: Self-pay | Admitting: *Deleted

## 2018-11-27 NOTE — Patient Outreach (Signed)
[facility]) Care Management  11/27/2018  [name] Feb 21, 1933 [id number]   [place] Monthly Outreach  Referral Date:  10/08/2018 Referral Source:  EMMI Prevent Screening Reason for Referral:  Disease Management Education Insurance:  [employer]   Outreach Attempt:  Successful telephone outreach to patient for follow up.  HIPAA verified with patient.  Patient reporting she is doing better.  States she recently seen Cardiology for increase in weight and lower extremity edema.  Medications adjusted.  Continues to weight daily, has not weighed yet this morning (just waking up).  Weight yesterday was 214 pounds.  Patient reporting with the adjustment of her medications her lower extremity edema is decreased, denies any shortness of breath and states blood pressure has been good.  Last blood pressure 118/69.  Report her vital sign tablet from [employer] continues to be broken.  Offered to contact [employer] while on telephone with patient; patient declines and states she will await [place] to contact her back.  Appointments:  Last attended appointment with Dr. Criss Rosales on 09/11/2018 and is awaiting her COVID test results to schedule follow up appointment.  States she underwent a COVID test due to her grandson living with her having to be tested.  Encouraged patient to schedule follow up appointment as soon as she is able.  Attended Cardiology appointment on 11/14/2018 and has follow up scheduled for 12/18/2018.  Plan: RN Health Coach will make next telephone outreach to patient within the month of October.  [place] [phone] [name]

## 2018-11-29 ENCOUNTER — Other Ambulatory Visit: Payer: Self-pay

## 2018-11-29 ENCOUNTER — Other Ambulatory Visit: Payer: Medicare Other | Admitting: *Deleted

## 2018-11-29 DIAGNOSIS — Z79899 Other long term (current) drug therapy: Secondary | ICD-10-CM

## 2018-11-29 LAB — BASIC METABOLIC PANEL
BUN/Creatinine Ratio: 27 (ref 12–28)
BUN: 42 mg/dL — ABNORMAL HIGH (ref 8–27)
CO2: 22 mmol/L (ref 20–29)
Calcium: 9.3 mg/dL (ref 8.7–10.3)
Chloride: 104 mmol/L (ref 96–106)
Creatinine, Ser: 1.54 mg/dL — ABNORMAL HIGH (ref 0.57–1.00)
GFR calc Af Amer: 35 mL/min/{1.73_m2} — ABNORMAL LOW (ref >59)
GFR calc non Af Amer: 31 mL/min/{1.73_m2} — ABNORMAL LOW (ref >59)
Glucose: 93 mg/dL (ref 65–99)
Potassium: 3.9 mmol/L (ref 3.5–5.2)
Sodium: 142 mmol/L (ref 134–144)

## 2018-11-30 ENCOUNTER — Telehealth: Payer: Self-pay

## 2018-11-30 DIAGNOSIS — R799 Abnormal finding of blood chemistry, unspecified: Secondary | ICD-10-CM

## 2018-11-30 DIAGNOSIS — Z79899 Other long term (current) drug therapy: Secondary | ICD-10-CM

## 2018-11-30 MED ORDER — CHLORTHALIDONE 25 MG PO TABS: 12.5000 mg | ORAL_TABLET | Freq: Every day | ORAL | 3 refills | Status: DC

## 2018-11-30 NOTE — Telephone Encounter (Signed)
Pt advised... next OV 12/18/18

## 2018-11-30 NOTE — Telephone Encounter (Signed)
-----   Message from Liliane Shi, [place] sent at 11/30/2018  8:12 AM EDT ----- Creatinine fairly stable.  BUN elevated (may be on the "dry" side).  Potassium normal. PLAN:   -Decrease chlorthalidone to 12.5 mg daily  -Arrange repeat BMET at the time of her next office visit Richardson Dopp, PA-C    11/30/2018 8:08 AM

## 2018-12-04 DIAGNOSIS — H353221 Exudative age-related macular degeneration, left eye, with active choroidal neovascularization: Secondary | ICD-10-CM | POA: Diagnosis not present

## 2018-12-04 DIAGNOSIS — E113293 Type 2 diabetes mellitus with mild nonproliferative diabetic retinopathy without macular edema, bilateral: Secondary | ICD-10-CM | POA: Diagnosis not present

## 2018-12-04 DIAGNOSIS — H353111 Nonexudative age-related macular degeneration, right eye, early dry stage: Secondary | ICD-10-CM | POA: Diagnosis not present

## 2018-12-04 DIAGNOSIS — H348312 Tributary (branch) retinal vein occlusion, right eye, stable: Secondary | ICD-10-CM | POA: Diagnosis not present

## 2018-12-17 NOTE — Progress Notes (Deleted)
Cardiology Office Note:    Date:  12/17/2018   ID:  [name], DOB October 21, 1932, MRN [id number]  PCP:  Samantha Lei, MD  Cardiologist:  Sherren Mocha, MD *** Electrophysiologist:  Thompson Grayer, MD   Referring MD: Samantha Lei, MD   No chief complaint on file. ***  History of Present Illness:    [name] is a 85 y.o. female with ***  CAD ? S/p CABG in 2011 ? Cardiac catheterization 8/15:  Patent Grafts  SSS s/p PPM  Diastolic CHF  Hypertension  Diabetes mellitus   Chronic kidney disease   PAD  [name] was last seen 11/14/2018.  Her diuretics had recently been adjusted for volume excess.  ***  Prior CV studies:   The following studies were reviewed today:  *** Carotid US 01/29/2018 Bilateral ICA 1-39  Myoview 11/24/2015 EF 60, no ischemia, no infarct; low risk  Echocardiogram 07/13/2015 EF 55-60, normal wall motion, trivial AI, MAC, moderate LAE, PASP 35  Carotid US 12/12/13 R 1-39% L 40-59% FU 1 year  [facility] 12/02/13 IR:SWNIOE. LAD:95% proximal in-stent restenosis. Diffuse disease throughout the remainder of the vessel that in the left ventricular apex. Reflux into the mammary is noted to reflux into the diagonal graft is also noted. VOJ:JKKXFGH occluded.40% stenosis proximal to the first obtuse marginal; the distal circumflex is totally occluded. WEX:HBZJIRC occluded distally. SVG to OM 1 and OM 2 is widely patent. SVG to diagonal #1 is widely patent. SVG to PDA is widely patent. LIMA to LAD is widely patent EF:60%.  Echo 03/06/12 EF 55% to 60%. Grade 2 diastolic dysfunction, MAC, trivial MR, mild LAE, normal RVSF, mild RAE, PASP 39 mmHg  Myoview 05/2005 Normal study, EF 63%  Past Medical History:  Diagnosis Date  . Allergic rhinitis   . Anemia   . Anxiety   . Barrett esophagus   . CAD (coronary artery disease) 2009   a. Multivessel s/p PCI w/DES 2009 // b. s/p CABG 2011  //  c. LHC 8/15: pLAD 95 ISR, LCx 100, pOM1 40,  dRCA 100, S-OM1/OM2 ok, S-D1 ok, S-PDA ok, L-LAD ok, EF 60%  . Carotid artery disease ([place])    a. Carotid US 7/89: RICA 3-81%; LICA 01-75% >> FU 1 year  //  b. Carotid US 9/17: R 1-39%, L 40-59% >> FU 1 year  . Chronic diastolic heart failure ([place])   . CKD (chronic kidney disease), stage II    GFR 60-89 ml/min  . Depression   . Disc disease, degenerative, cervical   . Diverticulosis   . Gastroparesis   . GERD (gastroesophageal reflux disease)   . Gout   . H/O hiatal hernia   . Helicobacter pylori gastritis   . History of echocardiogram    a. Echo 11/13: EF 55% to 60%. Grade 2 diastolic dysfunction, MAC, trivial MR, mild LAE, normal RVSF, mild RAE, PASP 39 mmHg  //  b. Echo 4/17: EF 55-60%, normal wall motion, trivial AI, MAC, moderate LAE, mild RVE, PASP 35 mmHg  . History of thrombocytopenia   . HTN (hypertension)   . Hyperlipidemia   . Hypothyroidism   . LBP (low back pain)    Lumbar disc disease/lumbar spinal stenosis  . Morbid obesity ([place])   . Myocardial infarction ([place])   . Osteoarthritis   . Osteopenia   . PVD (peripheral vascular disease) ([place])   . Sick sinus syndrome [facility])    MDT Dual-chamber PPM implant 02/2012  . Type II or unspecified  type diabetes mellitus without mention of complication, not stated as uncontrolled    Surgical Hx: The patient  has a past surgical history that includes Coronary stent placement; Cholecystectomy; Abdominal hysterectomy; Tubal ligation; Ovarian cyst removal; Coronary artery bypass graft (2011); Eye surgery; Shoulder arthroscopy (06/16/2011); Cardiac catheterization; Pacemaker insertion (03/06/12); permanent pacemaker insertion (N/A, 03/06/2012); and left heart catheterization with coronary angiogram (N/A, 12/02/2013).   Current Medications: No outpatient medications have been marked as taking for the 12/18/18 encounter (Appointment) with Richardson Dopp T, PA-C.   Current Facility-Administered Medications for the 12/18/18 encounter (Appointment)  with Liliane Shi, PA-C  Medication  . betamethasone acetate-betamethasone sodium phosphate (CELESTONE) injection 3 mg     Allergies:   Ciprofloxacin, Codeine, Hydrocodone, Penicillins, Shellfish allergy, Diltiazem hcl, Sulfonamide derivatives, Morphine and related, Lovastatin, and Metformin   Social History   Tobacco Use  . Smoking status: Former Research scientist (life sciences)  . Smokeless tobacco: Never Used  . Tobacco comment: quit 30 yrs ago  Substance Use Topics  . Alcohol use: No  . Drug use: No     Family Hx: The patient's family history includes Coronary artery disease in an other family member; Diabetes in her mother; Heart attack in her mother; Hypertension in her mother; Stroke in her father.  ROS:   Please see the history of present illness.    ROS All other systems reviewed and are negative.   EKGs/Labs/Other Test Reviewed:    EKG:  EKG is *** ordered today.  The ekg ordered today demonstrates ***  Recent Labs: 11/14/2018: Hemoglobin 10.9; Platelets 215 11/29/2018: BUN 42; Creatinine, Ser 1.54; Potassium 3.9; Sodium 142   Recent Lipid Panel Lab Results  Component Value Date/Time   CHOL 149 05/11/2015 07:49 AM   TRIG 66 05/11/2015 07:49 AM   TRIG 192 (H) 04/20/2006 12:46 PM   HDL 97 05/11/2015 07:49 AM   CHOLHDL 1.5 05/11/2015 07:49 AM   LDLCALC 39 05/11/2015 07:49 AM   LDLDIRECT 82.5 09/11/2009 12:00 AM    Physical Exam:    VS:  There were no vitals taken for this visit.    Wt Readings from Last 3 Encounters:  11/14/18 212 lb (96.2 kg)  08/08/18 218 lb (98.9 kg)  05/07/18 231 lb 6.4 oz (105 kg)     ***Physical Exam  ASSESSMENT & PLAN:    No diagnosis found.*** 1. Chronic diastolic heart failure (HCC) 2. Leg swelling From a heart failure standpoint, she seems to be improved.  Her lungs are clear.  She is sitting at 90 degrees but I do not see any evidence of JVD.  There is no HJR.  She continues to have significant leg swelling.  I suspect some of her leg swelling  may be related to side effects from amlodipine.  Her blood pressure is well controlled.  I suspect that she would be able to tolerate a lower dose of amlodipine.  If not, we can certainly adjust 1 of her other medications.             -Change Lasix/K+ to every Monday, Wednesday, Friday             -Decrease amlodipine to 5 mg daily             -Obtain BMET, CBC today             -Follow-up 1 month  3. Coronary artery disease involving native coronary artery of native heart without angina pectoris History of CABG in 2011.  She had patent bypass  grafts by cardiac catheterization 2015.  Myoview in 2017 was low risk.  She is not having anginal symptoms.  Continue aspirin, clopidogrel, atorvastatin.  4. Essential hypertension Her blood pressure is controlled.  However, I do think she could tolerate a lower dose of amlodipine.  If her pressure starts to increase, we could consider increasing her carvedilol.  5. CKD (chronic kidney disease) stage 3, GFR 30-59 ml/min (HCC) Repeat BMET will be obtained today.  Dispo:  No follow-ups on file.   Medication Adjustments/Labs and Tests Ordered: Current medicines are reviewed at length with the patient today.  Concerns regarding medicines are outlined above.  Tests Ordered: No orders of the defined types were placed in this encounter.  Medication Changes: No orders of the defined types were placed in this encounter.   Signed, Richardson Dopp, PA-C  12/17/2018 9:23 PM    [facility] [address], [place], [place]  [address] Phone: [phone]; Fax: [phone]

## 2018-12-18 ENCOUNTER — Other Ambulatory Visit: Payer: Self-pay | Admitting: Cardiology

## 2018-12-18 ENCOUNTER — Other Ambulatory Visit: Payer: Medicare Other

## 2018-12-18 ENCOUNTER — Ambulatory Visit: Payer: Medicare Other | Admitting: Physician Assistant

## 2018-12-26 DIAGNOSIS — E1169 Type 2 diabetes mellitus with other specified complication: Secondary | ICD-10-CM | POA: Diagnosis not present

## 2018-12-26 DIAGNOSIS — I1310 Hypertensive heart and chronic kidney disease without heart failure, with stage 1 through stage 4 chronic kidney disease, or unspecified chronic kidney disease: Secondary | ICD-10-CM | POA: Diagnosis not present

## 2019-01-08 DIAGNOSIS — H16223 Keratoconjunctivitis sicca, not specified as Sjogren's, bilateral: Secondary | ICD-10-CM | POA: Diagnosis not present

## 2019-01-08 DIAGNOSIS — E119 Type 2 diabetes mellitus without complications: Secondary | ICD-10-CM | POA: Diagnosis not present

## 2019-01-08 DIAGNOSIS — H11153 Pinguecula, bilateral: Secondary | ICD-10-CM | POA: Diagnosis not present

## 2019-01-14 ENCOUNTER — Ambulatory Visit (INDEPENDENT_AMBULATORY_CARE_PROVIDER_SITE_OTHER): Payer: Medicare Other | Admitting: *Deleted

## 2019-01-14 DIAGNOSIS — I5032 Chronic diastolic (congestive) heart failure: Secondary | ICD-10-CM

## 2019-01-14 DIAGNOSIS — I495 Sick sinus syndrome: Secondary | ICD-10-CM

## 2019-01-16 LAB — CUP PACEART REMOTE DEVICE CHECK
Battery Impedance: 836 Ohm
Battery Remaining Longevity: 60 mo
Battery Voltage: 2.77 V
Brady Statistic AP VP Percent: 79 %
Brady Statistic AP VS Percent: 6 %
Brady Statistic AS VP Percent: 13 %
Brady Statistic AS VS Percent: 2 %
Date Time Interrogation Session: 20201007130212
Implantable Lead Implant Date: 20131126
Implantable Lead Implant Date: 20131126
Implantable Lead Location: 753859
Implantable Lead Location: 753860
Implantable Lead Model: 5076
Implantable Lead Model: 5092
Implantable Pulse Generator Implant Date: 20131126
Lead Channel Impedance Value: 439 Ohm
Lead Channel Impedance Value: 464 Ohm
Lead Channel Pacing Threshold Amplitude: 0.5 V
Lead Channel Pacing Threshold Amplitude: 1.125 V
Lead Channel Pacing Threshold Pulse Width: 0.4 ms
Lead Channel Pacing Threshold Pulse Width: 0.4 ms
Lead Channel Setting Pacing Amplitude: 2 V
Lead Channel Setting Pacing Amplitude: 2.5 V
Lead Channel Setting Pacing Pulse Width: 0.4 ms
Lead Channel Setting Sensing Sensitivity: 2 mV

## 2019-01-18 ENCOUNTER — Other Ambulatory Visit: Payer: Self-pay | Admitting: *Deleted

## 2019-01-18 NOTE — Patient Outreach (Signed)
[facility]) Care Management  01/18/2019  [name] 18-Apr-1932 [id number]   [name]  Referral Date: 10/08/2018 Referral Source: EMMI Prevent Screening Reason for Referral: Disease Management Education Insurance: [employer]   Outreach Attempt:  Outreach attempt #1 to patient for follow up.  Female answered and stated patient was not available.  HIPAA compliant message left with female answering.   Plan:  RN Health Coach will make another outreach attempt within the month of November if no return call back from patient.  [place] [phone] [email or web address] [email or web address]

## 2019-01-21 NOTE — Progress Notes (Signed)
Remote pacemaker transmission.   

## 2019-01-29 DIAGNOSIS — H353221 Exudative age-related macular degeneration, left eye, with active choroidal neovascularization: Secondary | ICD-10-CM | POA: Diagnosis not present

## 2019-01-29 DIAGNOSIS — H348312 Tributary (branch) retinal vein occlusion, right eye, stable: Secondary | ICD-10-CM | POA: Diagnosis not present

## 2019-01-29 DIAGNOSIS — H35371 Puckering of macula, right eye: Secondary | ICD-10-CM | POA: Diagnosis not present

## 2019-01-29 DIAGNOSIS — H353111 Nonexudative age-related macular degeneration, right eye, early dry stage: Secondary | ICD-10-CM | POA: Diagnosis not present

## 2019-01-30 DIAGNOSIS — H353111 Nonexudative age-related macular degeneration, right eye, early dry stage: Secondary | ICD-10-CM | POA: Diagnosis not present

## 2019-01-30 DIAGNOSIS — S0502XA Injury of conjunctiva and corneal abrasion without foreign body, left eye, initial encounter: Secondary | ICD-10-CM | POA: Diagnosis not present

## 2019-01-30 DIAGNOSIS — H348312 Tributary (branch) retinal vein occlusion, right eye, stable: Secondary | ICD-10-CM | POA: Diagnosis not present

## 2019-01-30 DIAGNOSIS — H353221 Exudative age-related macular degeneration, left eye, with active choroidal neovascularization: Secondary | ICD-10-CM | POA: Diagnosis not present

## 2019-02-01 ENCOUNTER — Other Ambulatory Visit: Payer: Self-pay

## 2019-02-01 DIAGNOSIS — Z20822 Contact with and (suspected) exposure to covid-19: Secondary | ICD-10-CM

## 2019-02-02 LAB — NOVEL CORONAVIRUS, NAA: SARS-CoV-2, NAA: DETECTED — AB

## 2019-02-15 ENCOUNTER — Ambulatory Visit: Payer: Medicare Other | Admitting: Podiatry

## 2019-02-18 ENCOUNTER — Other Ambulatory Visit: Payer: Self-pay

## 2019-02-18 DIAGNOSIS — Z20822 Contact with and (suspected) exposure to covid-19: Secondary | ICD-10-CM

## 2019-02-20 ENCOUNTER — Other Ambulatory Visit: Payer: Self-pay | Admitting: *Deleted

## 2019-02-20 ENCOUNTER — Encounter: Payer: Self-pay | Admitting: *Deleted

## 2019-02-20 NOTE — Patient Outreach (Signed)
[facility]) Care Management  [place]  02/20/2019   [name] 09/17/1932 [id number]   [place] Monthly Outreach   Referral Date:  10/08/2018 Referral Source:  EMMI Prevent Screening Reason for Referral:  Disease Management Education Insurance:  [employer]   Outreach Attempt:  Successful telephone outreach to patient for follow up.  HIPAA verified with patient.  Patient reporting she is positive for COVID after exposure from her daughter that lives with her and works at the post office.  Denies any cold or flu like symptoms with positive diagnosis and states she is awaiting her follow up test results.  Patient reporting she did contact [employer] concerning tablet not working and they requested she return equipment.  Does have scale in the home but has not been weighing daily.  Last weight was 213 pounds.  Discussed importance of daily monitoring.  Continues to monitor blood sugars.  Fasting blood sugar this morning was 97.  Encounter Medications:  Outpatient Encounter Medications as of 02/20/2019  Medication Sig  . acetaminophen (TYLENOL) 500 MG tablet Take 500 mg by mouth every 6 (six) hours as needed for headache (pain).  Marland Kitchen allopurinol (ZYLOPRIM) 100 MG tablet Take 100 mg by mouth daily.  Marland Kitchen amLODipine (NORVASC) 5 MG tablet Take 1 tablet (5 mg total) by mouth daily.  Marland Kitchen aspirin EC 81 MG tablet Take 81 mg by mouth daily.  Marland Kitchen atorvastatin (LIPITOR) 20 MG tablet TAKE 1 TABLET BY MOUTH  DAILY  . azelastine (OPTIVAR) 0.05 % ophthalmic solution Place 1 drop into both eyes 2 (two) times daily.  . carvedilol (COREG) 6.25 MG tablet TAKE 1 TABLET BY MOUTH TWO  TIMES DAILY  . chlorthalidone (HYGROTON) 25 MG tablet Take 0.5 tablets (12.5 mg total) by mouth daily.  . clopidogrel (PLAVIX) 75 MG tablet TAKE 1 TABLET BY MOUTH  DAILY  . colchicine 0.6 MG tablet Take 0.6 mg by mouth daily as needed (gout).   . furosemide (LASIX) 40 MG tablet  Only take Mon, Wed and Friday with Potassium.  . insulin NPH Human (HUMULIN N,NOVOLIN N) 100 UNIT/ML injection Inject 5-45 Units into the skin See admin instructions. Inject 45 units subcutaneously every morning and 10 units at night  . levothyroxine (SYNTHROID, LEVOTHROID) 100 MCG tablet Take 1 tablet (100 mcg total) by mouth daily.  . Multiple Vitamin (MULTIVITAMIN WITH MINERALS) TABS tablet Take 1 tablet by mouth daily.  . nitroGLYCERIN (NITROSTAT) 0.4 MG SL tablet DISSOLVE ONE TABLET UNDER THE TONGUE EVERY 5 MINUTES AS NEEDED FOR CHEST PAIN.  DO NOT EXCEED A TOTAL OF 3 DOSES IN 15 MINUTES  . ONE TOUCH ULTRA TEST test strip 1 each by Other route daily as needed (blood sugar).   Marland Kitchen oxyCODONE-acetaminophen (PERCOCET/ROXICET) 5-325 MG tablet Take 1 tablet by mouth daily as needed (pain).   . Polyvinyl Alcohol-Povidone (REFRESH OP) Place 1 drop into both eyes 4 (four) times daily as needed (dry eyes).  . potassium chloride (K-DUR) 10 MEQ tablet Only take Mon, Wed, and Friday with lasix.  Marland Kitchen sacubitril-valsartan (ENTRESTO) 97-103 MG Take 1 tablet by mouth 2 (two) times daily.   Facility-Administered Encounter Medications as of 02/20/2019  Medication  . betamethasone acetate-betamethasone sodium phosphate (CELESTONE) injection 3 mg    Functional Status:  In your present state of health, do you have any difficulty performing the following activities: 11/01/2018  Hearing? Y  Comment difficult hearing at times  Vision? N  Difficulty concentrating or making decisions? N  Walking or climbing stairs? Y  Comment difficulties climbing stairs  Dressing or bathing? Y  Comment needs assistance bathing  Doing errands, shopping? Y  Comment family usually does Engineer, drilling and eating ? Y  Comment family cooks  Using the Toilet? N  In the past six months, have you accidently leaked urine? N  Do you have problems with loss of bowel control? N  Managing your Medications? N  Managing your  Finances? N  Housekeeping or managing your Housekeeping? Y  Comment family does cleaning  Some recent data might be hidden    Fall/Depression Screening: Fall Risk  02/20/2019 11/27/2018 11/01/2018  Falls in the past year? 0 0 0  Comment patient denies falls - -  Risk for fall due to : Medication side effect;Impaired balance/gait;Impaired mobility;Impaired vision Medication side effect;Impaired balance/gait;Impaired mobility;Impaired vision Medication side effect;Impaired balance/gait;Impaired mobility;Impaired vision  Follow up Falls evaluation completed;Education provided;Falls prevention discussed Falls evaluation completed;Education provided;Falls prevention discussed Falls evaluation completed;Education provided;Falls prevention discussed   PHQ 2/9 Scores 11/01/2018 10/08/2018  PHQ - 2 Score 2 2  PHQ- 9 Score 2 2   THN CM Care Plan Problem One     Most Recent Value  Care Plan Problem One  Knowledge deficiet related to diabetes.  Role Documenting the Problem One  [place] for Problem One  Active  THN Long Term Goal   Patient will maintaing Hgb A1C of 7 or below within the next 90 days.  THN Long Term Goal Start Date  02/20/19  Interventions for Problem One Long Term Goal  Goals and care plan reviewed and discussed, discussed importance of daily weight monitoring and encouraeged to weigh herself daily, discussed when to call provider based on weight, encouraged to continue monitoring blood sugars, reviewed signs and symptoms of hypo and hyperglycemia, encouraged patient to continue to monitor for symptoms of COVID and to notify provider if symptoms arise, encouraged to arrange follow up appointment with primary care is recent COVID test is negative, reviewed medications and encouraged medication compliance  THN CM Short Term Goal #1   Patient will contact [place] concerning malfunctioning vital sign tablet within the next 30 days.  THN CM Short Term Goal #1 Start Date   11/27/18  [facility] CM Short Term Goal #1 Met Date  02/20/19     Appointments:  Last seen primary care provider, Dr. Criss Rosales in February and is awaiting negative COVID test to arrange next follow up.  Plan: RN Health Coach will send primary care provider quarterly update. RN Health Coach will make next telephone outreach to patient within the month of January.  [place] [phone] [email or web address]

## 2019-02-21 LAB — NOVEL CORONAVIRUS, NAA: SARS-CoV-2, NAA: NOT DETECTED

## 2019-03-27 DIAGNOSIS — R609 Edema, unspecified: Secondary | ICD-10-CM | POA: Diagnosis not present

## 2019-03-27 DIAGNOSIS — E034 Atrophy of thyroid (acquired): Secondary | ICD-10-CM | POA: Diagnosis not present

## 2019-03-27 DIAGNOSIS — I1310 Hypertensive heart and chronic kidney disease without heart failure, with stage 1 through stage 4 chronic kidney disease, or unspecified chronic kidney disease: Secondary | ICD-10-CM | POA: Diagnosis not present

## 2019-03-27 DIAGNOSIS — E1169 Type 2 diabetes mellitus with other specified complication: Secondary | ICD-10-CM | POA: Diagnosis not present

## 2019-03-27 DIAGNOSIS — J0100 Acute maxillary sinusitis, unspecified: Secondary | ICD-10-CM | POA: Diagnosis not present

## 2019-04-09 DIAGNOSIS — H353221 Exudative age-related macular degeneration, left eye, with active choroidal neovascularization: Secondary | ICD-10-CM | POA: Diagnosis not present

## 2019-04-09 DIAGNOSIS — H353111 Nonexudative age-related macular degeneration, right eye, early dry stage: Secondary | ICD-10-CM | POA: Diagnosis not present

## 2019-04-09 DIAGNOSIS — E113293 Type 2 diabetes mellitus with mild nonproliferative diabetic retinopathy without macular edema, bilateral: Secondary | ICD-10-CM | POA: Diagnosis not present

## 2019-04-09 DIAGNOSIS — H35371 Puckering of macula, right eye: Secondary | ICD-10-CM | POA: Diagnosis not present

## 2019-04-10 ENCOUNTER — Other Ambulatory Visit: Payer: Self-pay

## 2019-04-10 ENCOUNTER — Other Ambulatory Visit: Payer: Self-pay | Admitting: Cardiovascular Disease

## 2019-04-10 ENCOUNTER — Other Ambulatory Visit: Payer: Self-pay | Admitting: Physician Assistant

## 2019-04-10 DIAGNOSIS — I5032 Chronic diastolic (congestive) heart failure: Secondary | ICD-10-CM

## 2019-04-10 MED ORDER — CARVEDILOL 6.25 MG PO TABS: 6.2500 mg | ORAL_TABLET | Freq: Two times a day (BID) | ORAL | 3 refills | Status: DC

## 2019-04-10 NOTE — Telephone Encounter (Signed)
*  STAT* If patient is at the pharmacy, call can be transferred to refill team.   1. Which medications need to be refilled? (please list name of each medication and dose if known) carvedilol (COREG) 6.25 MG tablet  2. Which pharmacy/location (including street and city if local pharmacy) is medication to be sent to? [facility]  3. Do they need a 30 day or 90 day supply? [place]

## 2019-04-15 ENCOUNTER — Ambulatory Visit (INDEPENDENT_AMBULATORY_CARE_PROVIDER_SITE_OTHER): Payer: Medicare Other | Admitting: *Deleted

## 2019-04-15 DIAGNOSIS — I5032 Chronic diastolic (congestive) heart failure: Secondary | ICD-10-CM

## 2019-04-17 ENCOUNTER — Other Ambulatory Visit: Payer: Self-pay | Admitting: *Deleted

## 2019-04-17 ENCOUNTER — Encounter: Payer: Self-pay | Admitting: *Deleted

## 2019-04-17 ENCOUNTER — Telehealth: Payer: Self-pay

## 2019-04-17 LAB — CUP PACEART REMOTE DEVICE CHECK
Battery Impedance: 967 Ohm
Battery Remaining Longevity: 55 mo
Battery Voltage: 2.77 V
Brady Statistic AP VP Percent: 80 %
Brady Statistic AP VS Percent: 6 %
Brady Statistic AS VP Percent: 12 %
Brady Statistic AS VS Percent: 2 %
Date Time Interrogation Session: 20210105112535
Implantable Lead Implant Date: 20131126
Implantable Lead Implant Date: 20131126
Implantable Lead Location: 753859
Implantable Lead Location: 753860
Implantable Lead Model: 5076
Implantable Lead Model: 5092
Implantable Pulse Generator Implant Date: 20131126
Lead Channel Impedance Value: 433 Ohm
Lead Channel Impedance Value: 460 Ohm
Lead Channel Pacing Threshold Amplitude: 0.5 V
Lead Channel Pacing Threshold Amplitude: 1.125 V
Lead Channel Pacing Threshold Pulse Width: 0.4 ms
Lead Channel Pacing Threshold Pulse Width: 0.4 ms
Lead Channel Setting Pacing Amplitude: 2 V
Lead Channel Setting Pacing Amplitude: 2.5 V
Lead Channel Setting Pacing Pulse Width: 0.4 ms
Lead Channel Setting Sensing Sensitivity: 2 mV

## 2019-04-17 NOTE — Telephone Encounter (Signed)
Spoke with patient to remind of missed remote transmission 

## 2019-04-17 NOTE — Patient Outreach (Signed)
[place] [facility]) Care Management  [facility] Care Manager  04/17/2019   [name] 1932-04-19 [id number]   [place] Every Other Month Outreach   Referral Date:  10/08/2018 Referral Source:  EMMI Prevent Screening Reason for Referral:  Disease Management Education Insurance:  [employer]     Outreach Attempt:  Successful telephone outreach to patient for follow up.  HIPAA verified with patient.  Patient reporting she is doing well, but states she had a fall about a week ago.  Fall was related to her misjudging where her chair was placed and when she went to sit down, the chair was not behind her and she fell to floor.  Denies any injury but does report she is sore a little.  States she feels completely healed from [place] and has had negative test result.  Denies any recent sick days.  Continues to monitor blood sugars, has not checked yet today.  Reports fasting blood sugar ranges have been 80-90's, mainly related to her eating dinner earlier in the evening.  Discussed and encouraged bedtime snack to prevent morning hypoglycemic episodes.  Latest Hgb A1C was 6.9 on 03/21/2019.  Admits to not weighing daily but does weigh several times a week.  Weight yesterday was 198 pounds.  Reports a decrease in her normal lower extremity edema but denies any shortness of breath.  Encounter Medications:  Outpatient Encounter Medications as of 04/17/2019  Medication Sig Note  . acetaminophen (TYLENOL) 500 MG tablet Take 500 mg by mouth every 6 (six) hours as needed for headache (pain).   Marland Kitchen allopurinol (ZYLOPRIM) 100 MG tablet Take 100 mg by mouth daily.   Marland Kitchen amLODipine (NORVASC) 5 MG tablet Take 1 tablet (5 mg total) by mouth daily.   Marland Kitchen aspirin EC 81 MG tablet Take 81 mg by mouth daily.   Marland Kitchen atorvastatin (LIPITOR) 20 MG tablet TAKE 1 TABLET BY MOUTH  DAILY   . azelastine (OPTIVAR) 0.05 % ophthalmic solution Place 1 drop into both eyes 2 (two) times daily.   . carvedilol (COREG)  6.25 MG tablet Take 1 tablet (6.25 mg total) by mouth 2 (two) times daily.   . chlorthalidone (HYGROTON) 25 MG tablet Take 0.5 tablets (12.5 mg total) by mouth daily.   . clopidogrel (PLAVIX) 75 MG tablet TAKE 1 TABLET BY MOUTH  DAILY   . colchicine 0.6 MG tablet Take 0.6 mg by mouth daily as needed (gout).    . furosemide (LASIX) 40 MG tablet Only take Mon, Wed and Friday with Potassium.   . insulin NPH Human (HUMULIN N,NOVOLIN N) 100 UNIT/ML injection Inject 5-45 Units into the skin See admin instructions. Inject 45 units subcutaneously every morning and 10 units at night   . levothyroxine (SYNTHROID) 50 MCG tablet Take 50 mcg by mouth every morning.   . Multiple Vitamin (MULTIVITAMIN WITH MINERALS) TABS tablet Take 1 tablet by mouth daily.   . Polyvinyl Alcohol-Povidone (REFRESH OP) Place 1 drop into both eyes 4 (four) times daily as needed (dry eyes).   . potassium chloride (K-DUR) 10 MEQ tablet Only take Mon, Wed, and Friday with lasix.   Marland Kitchen sacubitril-valsartan (ENTRESTO) 97-103 MG Take 1 tablet by mouth 2 (two) times daily.   Marland Kitchen levothyroxine (SYNTHROID, LEVOTHROID) 100 MCG tablet Take 1 tablet (100 mcg total) by mouth daily. (Patient not taking: Reported on 04/17/2019) 04/17/2019: Dose decreased to 50 mcg  . nitroGLYCERIN (NITROSTAT) 0.4 MG SL tablet DISSOLVE ONE TABLET UNDER THE TONGUE EVERY 5 MINUTES AS  NEEDED FOR CHEST PAIN.  DO NOT EXCEED A TOTAL OF 3 DOSES IN 15 MINUTES   . ONE TOUCH ULTRA TEST test strip 1 each by Other route daily as needed (blood sugar).    Marland Kitchen oxyCODONE-acetaminophen (PERCOCET/ROXICET) 5-325 MG tablet Take 1 tablet by mouth daily as needed (pain).     Facility-Administered Encounter Medications as of 04/17/2019  Medication  . betamethasone acetate-betamethasone sodium phosphate (CELESTONE) injection 3 mg    Functional Status:  In your present state of health, do you have any difficulty performing the following activities: 11/01/2018  Hearing? Y  Comment difficult  hearing at times  Vision? N  Difficulty concentrating or making decisions? N  Walking or climbing stairs? Y  Comment difficulties climbing stairs  Dressing or bathing? Y  Comment needs assistance bathing  Doing errands, shopping? Y  Comment family usually does Engineer, drilling and eating ? Y  Comment family cooks  Using the Toilet? N  In the past six months, have you accidently leaked urine? N  Do you have problems with loss of bowel control? N  Managing your Medications? N  Managing your Finances? N  Housekeeping or managing your Housekeeping? Y  Comment family does cleaning  Some recent data might be hidden    Fall/Depression Screening: Fall Risk  04/17/2019 02/20/2019 11/27/2018  Falls in the past year? 1 0 0  Comment fall December 2020 patient denies falls -  Number falls in past yr: 0 - -  Injury with Fall? 0 - -  Risk for fall due to : History of fall(s);Medication side effect;Impaired balance/gait;Impaired mobility;Impaired vision Medication side effect;Impaired balance/gait;Impaired mobility;Impaired vision Medication side effect;Impaired balance/gait;Impaired mobility;Impaired vision  Follow up Falls evaluation completed;Education provided;Falls prevention discussed Falls evaluation completed;Education provided;Falls prevention discussed Falls evaluation completed;Education provided;Falls prevention discussed   PHQ 2/9 Scores 11/01/2018 10/08/2018  PHQ - 2 Score 2 2  PHQ- 9 Score 2 2   THN CM Care Plan Problem One     Most Recent Value  Care Plan Problem One  Knowledge deficiet related to diabetes.  Role Documenting the Problem One  [place] for Problem One  Active  [facility] Long Term Goal   Patient will continue to maintain Hgb A1C of 7 or below within the next 90 days.  THN Long Term Goal Start Date  04/17/19  [facility]N Long Term Goal Met Date  04/17/19  Interventions for Problem One Long Term Goal  Congratulated patient on current A1C remaining in goal and  discussed ways to keep it reduced, reviewed and discussed current care plan and goals, reviewed medications and encouraged medication compliance, fall precautions and preventions reviewed and discussed, encouraged patient to use walker at all times with ambulation and to make sure her legs are touching chair when trying to sit, discussed importance of daily weight monitoring and encouraged patient to do so, encouraged to continue to monitor blood sugars, encouraged to keep and attend scheduled medical appointments     Appointments:  Reports attending appointment with primary care provider, Dr. Criss Rosales around 03/21/2019 and has scheduled follow up appointment on 05/15/2019.  Plan: RN Health Coach will send primary care provider quarterly update. RN Health Coach will make next telephone outreach to patient within the month of April and patient agrees to future outreach.  [place] [phone] [email or web address]

## 2019-05-12 DIAGNOSIS — E1122 Type 2 diabetes mellitus with diabetic chronic kidney disease: Secondary | ICD-10-CM | POA: Diagnosis not present

## 2019-05-12 DIAGNOSIS — I130 Hypertensive heart and chronic kidney disease with heart failure and stage 1 through stage 4 chronic kidney disease, or unspecified chronic kidney disease: Secondary | ICD-10-CM | POA: Diagnosis not present

## 2019-05-12 DIAGNOSIS — N183 Chronic kidney disease, stage 3 unspecified: Secondary | ICD-10-CM | POA: Diagnosis not present

## 2019-05-12 DIAGNOSIS — I5032 Chronic diastolic (congestive) heart failure: Secondary | ICD-10-CM | POA: Diagnosis not present

## 2019-05-13 DIAGNOSIS — I1310 Hypertensive heart and chronic kidney disease without heart failure, with stage 1 through stage 4 chronic kidney disease, or unspecified chronic kidney disease: Secondary | ICD-10-CM | POA: Diagnosis not present

## 2019-05-13 DIAGNOSIS — E039 Hypothyroidism, unspecified: Secondary | ICD-10-CM | POA: Diagnosis not present

## 2019-05-13 DIAGNOSIS — J0100 Acute maxillary sinusitis, unspecified: Secondary | ICD-10-CM | POA: Diagnosis not present

## 2019-05-13 DIAGNOSIS — E1169 Type 2 diabetes mellitus with other specified complication: Secondary | ICD-10-CM | POA: Diagnosis not present

## 2019-05-13 DIAGNOSIS — R609 Edema, unspecified: Secondary | ICD-10-CM | POA: Diagnosis not present

## 2019-05-16 DIAGNOSIS — E1169 Type 2 diabetes mellitus with other specified complication: Secondary | ICD-10-CM | POA: Diagnosis not present

## 2019-05-16 DIAGNOSIS — I1310 Hypertensive heart and chronic kidney disease without heart failure, with stage 1 through stage 4 chronic kidney disease, or unspecified chronic kidney disease: Secondary | ICD-10-CM | POA: Diagnosis not present

## 2019-05-16 DIAGNOSIS — I10 Essential (primary) hypertension: Secondary | ICD-10-CM | POA: Diagnosis not present

## 2019-05-16 DIAGNOSIS — R609 Edema, unspecified: Secondary | ICD-10-CM | POA: Diagnosis not present

## 2019-06-02 ENCOUNTER — Ambulatory Visit: Payer: Medicare Other | Attending: Internal Medicine

## 2019-06-02 DIAGNOSIS — Z23 Encounter for immunization: Secondary | ICD-10-CM | POA: Insufficient documentation

## 2019-06-02 NOTE — Progress Notes (Signed)
   Covid-19 Vaccination Clinic  Name:  [name]    MRN: [id number] DOB: 05/23/1932  06/02/2019  [name] was observed post Covid-19 immunization for 15 minutes without incidence. She was provided with Vaccine Information Sheet and instruction to access the V-Safe system.   [name] was instructed to call 911 with any severe reactions post vaccine: Marland Kitchen Difficulty breathing  . Swelling of your face and throat  . A fast heartbeat  . A bad rash all over your body  . Dizziness and weakness    Immunizations Administered    Name Date Dose VIS Date Route   Pfizer COVID-19 Vaccine 06/02/2019 11:57 AM 0.3 mL 03/22/2019 Intramuscular   Manufacturer: [employer], [employer]   Lot: [id number]   NDC: [address]-3

## 2019-06-09 DIAGNOSIS — I130 Hypertensive heart and chronic kidney disease with heart failure and stage 1 through stage 4 chronic kidney disease, or unspecified chronic kidney disease: Secondary | ICD-10-CM | POA: Diagnosis not present

## 2019-06-09 DIAGNOSIS — E1122 Type 2 diabetes mellitus with diabetic chronic kidney disease: Secondary | ICD-10-CM | POA: Diagnosis not present

## 2019-06-09 DIAGNOSIS — I5032 Chronic diastolic (congestive) heart failure: Secondary | ICD-10-CM | POA: Diagnosis not present

## 2019-06-09 DIAGNOSIS — N183 Chronic kidney disease, stage 3 unspecified: Secondary | ICD-10-CM | POA: Diagnosis not present

## 2019-06-11 DIAGNOSIS — Z1231 Encounter for screening mammogram for malignant neoplasm of breast: Secondary | ICD-10-CM | POA: Diagnosis not present

## 2019-06-11 DIAGNOSIS — M85851 Other specified disorders of bone density and structure, right thigh: Secondary | ICD-10-CM | POA: Diagnosis not present

## 2019-06-11 DIAGNOSIS — M85852 Other specified disorders of bone density and structure, left thigh: Secondary | ICD-10-CM | POA: Diagnosis not present

## 2019-06-18 DIAGNOSIS — H353221 Exudative age-related macular degeneration, left eye, with active choroidal neovascularization: Secondary | ICD-10-CM | POA: Diagnosis not present

## 2019-06-18 DIAGNOSIS — H353111 Nonexudative age-related macular degeneration, right eye, early dry stage: Secondary | ICD-10-CM | POA: Diagnosis not present

## 2019-06-18 DIAGNOSIS — H348312 Tributary (branch) retinal vein occlusion, right eye, stable: Secondary | ICD-10-CM | POA: Diagnosis not present

## 2019-06-18 DIAGNOSIS — E113293 Type 2 diabetes mellitus with mild nonproliferative diabetic retinopathy without macular edema, bilateral: Secondary | ICD-10-CM | POA: Diagnosis not present

## 2019-06-22 ENCOUNTER — Other Ambulatory Visit: Payer: Self-pay | Admitting: Cardiovascular Disease

## 2019-06-22 ENCOUNTER — Other Ambulatory Visit: Payer: Self-pay | Admitting: Physician Assistant

## 2019-06-22 DIAGNOSIS — E785 Hyperlipidemia, unspecified: Secondary | ICD-10-CM

## 2019-06-25 ENCOUNTER — Ambulatory Visit: Payer: Medicare Other | Attending: Internal Medicine

## 2019-06-25 DIAGNOSIS — Z23 Encounter for immunization: Secondary | ICD-10-CM

## 2019-06-25 NOTE — Progress Notes (Signed)
   Covid-19 Vaccination Clinic  Name:  [name]    MRN: [id number] DOB: Jul 05, 1932  06/25/2019  [name] was observed post Covid-19 immunization for 30 minutes based on pre-vaccination screening without incident. She was provided with Vaccine Information Sheet and instruction to access the V-Safe system.   [name] was instructed to call 911 with any severe reactions post vaccine: Marland Kitchen Difficulty breathing  . Swelling of face and throat  . A fast heartbeat  . A bad rash all over body  . Dizziness and weakness   Immunizations Administered    Name Date Dose VIS Date Route   Pfizer COVID-19 Vaccine 06/25/2019  3:08 PM 0.3 mL 03/22/2019 Intramuscular   Manufacturer: [employer], [employer]   Lot: [id number]   NDC: [address]-8

## 2019-07-11 ENCOUNTER — Other Ambulatory Visit: Payer: Self-pay | Admitting: *Deleted

## 2019-07-11 NOTE — Patient Outreach (Signed)
[facility]) Care Management  07/11/2019  [name] [name] 1932-11-02 [id number]   RN Health Coach Quarterly Outreach  Referral Date: 10/08/2018 Referral Source: EMMI Prevent Screening Reason for Referral: Disease Management Education Insurance: [employer]   Outreach Attempt:  Outreach attempt #1 to patient for follow up. No answer and unable to leave voicemail message due to voicemail not being set up.  Plan:  RN Health Coach will make another outreach attempt within the month of May if no return call back from patient.  Rhae Lerner RN [facility] Care Management  RN Health Coach [phone] [name]

## 2019-08-05 ENCOUNTER — Ambulatory Visit (INDEPENDENT_AMBULATORY_CARE_PROVIDER_SITE_OTHER): Payer: Medicare Other | Admitting: *Deleted

## 2019-08-05 DIAGNOSIS — I5032 Chronic diastolic (congestive) heart failure: Secondary | ICD-10-CM | POA: Diagnosis not present

## 2019-08-06 LAB — CUP PACEART REMOTE DEVICE CHECK
Battery Impedance: 1099 Ohm
Battery Remaining Longevity: 51 mo
Battery Voltage: 2.77 V
Brady Statistic AP VP Percent: 81 %
Brady Statistic AP VS Percent: 5 %
Brady Statistic AS VP Percent: 12 %
Brady Statistic AS VS Percent: 2 %
Date Time Interrogation Session: 20210426175809
Implantable Lead Implant Date: 20131126
Implantable Lead Implant Date: 20131126
Implantable Lead Location: 753859
Implantable Lead Location: 753860
Implantable Lead Model: 5076
Implantable Lead Model: 5092
Implantable Pulse Generator Implant Date: 20131126
Lead Channel Impedance Value: 421 Ohm
Lead Channel Impedance Value: 475 Ohm
Lead Channel Pacing Threshold Amplitude: 0.5 V
Lead Channel Pacing Threshold Amplitude: 1.25 V
Lead Channel Pacing Threshold Pulse Width: 0.4 ms
Lead Channel Pacing Threshold Pulse Width: 0.4 ms
Lead Channel Setting Pacing Amplitude: 2 V
Lead Channel Setting Pacing Amplitude: 2.5 V
Lead Channel Setting Pacing Pulse Width: 0.4 ms
Lead Channel Setting Sensing Sensitivity: 2 mV

## 2019-08-07 NOTE — Progress Notes (Signed)
PPM Remote  

## 2019-08-20 ENCOUNTER — Encounter: Payer: Self-pay | Admitting: *Deleted

## 2019-08-20 ENCOUNTER — Other Ambulatory Visit: Payer: Self-pay | Admitting: *Deleted

## 2019-08-20 NOTE — Patient Outreach (Signed)
[name] [name]) Care Management  08/20/2019  [place] 1933/03/22 [id number]   [facility]) Care Management  [name]  08/20/2019   [name] 11-18-1932 [id number]   RN Health Coach Quarterly Outreach   Referral Date:  10/08/2018 Referral Source:  EMMI Prevent Screening Reason for Referral:  Disease Management Education Insurance:  [employer]   Outreach Attempt:  Successful telephone outreach to patient for follow up.  HIPAA verified with patient.  Patient reporting she has been doing well.  Denies any falls since fall in January (missing chair).  Has not checked blood sugar yet this morning but reports fasting blood sugars have ranged 98-110.  Latest Hgb A1C was 7 on 05/13/19.  Reports taking about 5 units of Insulin when blood sugars >130 and states she has a lot of days not needing to take insulin.  Does not weigh daily but weighs frequently.  States weight has stayed about the same ranging 213-220.  Continues to report lower extremity edema throughout the day that resolves overnight.  Does state she elevated her extremities during the day and wears support hose to help with swelling.  Denies any shortness of breath.  Encounter Medications:  Outpatient Encounter Medications as of 08/20/2019  Medication Sig Note  . acetaminophen (TYLENOL) 500 MG tablet Take 500 mg by mouth every 6 (six) hours as needed for headache (pain).   Marland Kitchen allopurinol (ZYLOPRIM) 100 MG tablet Take 100 mg by mouth daily.   Marland Kitchen amLODipine (NORVASC) 5 MG tablet Take 1 tablet (5 mg total) by mouth daily.   Marland Kitchen aspirin EC 81 MG tablet Take 81 mg by mouth daily.   Marland Kitchen atorvastatin (LIPITOR) 20 MG tablet TAKE 1 TABLET BY MOUTH  DAILY   . carvedilol (COREG) 6.25 MG tablet Take 1 tablet (6.25 mg total) by mouth 2 (two) times daily.   . chlorthalidone (HYGROTON) 25 MG tablet Take 0.5 tablets (12.5 mg total) by mouth daily.   . clopidogrel (PLAVIX) 75 MG tablet  TAKE 1 TABLET BY MOUTH  DAILY   . colchicine 0.6 MG tablet Take 0.6 mg by mouth daily as needed (gout).    . furosemide (LASIX) 40 MG tablet Only take Mon, Wed and Friday with Potassium.   . insulin NPH Human (HUMULIN N,NOVOLIN N) 100 UNIT/ML injection Inject 5-45 Units into the skin See admin instructions. Inject 45 units subcutaneously every morning and 10 units at night 08/20/2019: Reports taking insulin 5 units for CBG > 130 in the morning  . Multiple Vitamin (MULTIVITAMIN WITH MINERALS) TABS tablet Take 1 tablet by mouth daily.   . potassium chloride (K-DUR) 10 MEQ tablet Only take Mon, Wed, and Friday with lasix.   Marland Kitchen sacubitril-valsartan (ENTRESTO) 97-103 MG Take 1 tablet by mouth 2 (two) times daily.   Marland Kitchen azelastine (OPTIVAR) 0.05 % ophthalmic solution Place 1 drop into both eyes 2 (two) times daily.   Marland Kitchen levothyroxine (SYNTHROID) 50 MCG tablet Take 50 mcg by mouth every morning. 08/20/2019: Reports not taking at current time  . levothyroxine (SYNTHROID, LEVOTHROID) 100 MCG tablet Take 1 tablet (100 mcg total) by mouth daily. (Patient not taking: Reported on 04/17/2019) 04/17/2019: Dose decreased to 50 mcg  . nitroGLYCERIN (NITROSTAT) 0.4 MG SL tablet DISSOLVE ONE TABLET UNDER THE TONGUE EVERY 5 MINUTES AS NEEDED FOR CHEST PAIN.  DO NOT EXCEED A TOTAL OF 3 DOSES IN 15 MINUTES   . ONE TOUCH ULTRA TEST test strip 1 each by Other route daily  as needed (blood sugar).    Marland Kitchen oxyCODONE-acetaminophen (PERCOCET/ROXICET) 5-325 MG tablet Take 1 tablet by mouth daily as needed (pain).    . Polyvinyl Alcohol-Povidone (REFRESH OP) Place 1 drop into both eyes 4 (four) times daily as needed (dry eyes).    Facility-Administered Encounter Medications as of 08/20/2019  Medication  . betamethasone acetate-betamethasone sodium phosphate (CELESTONE) injection 3 mg    Functional Status:  In your present state of health, do you have any difficulty performing the following activities: 11/01/2018  Hearing? Y  Comment  difficult hearing at times  Vision? N  Difficulty concentrating or making decisions? N  Walking or climbing stairs? Y  Comment difficulties climbing stairs  Dressing or bathing? Y  Comment needs assistance bathing  Doing errands, shopping? Y  Comment family usually does Engineer, drilling and eating ? Y  Comment family cooks  Using the Toilet? N  In the past six months, have you accidently leaked urine? N  Do you have problems with loss of bowel control? N  Managing your Medications? N  Managing your Finances? N  Housekeeping or managing your Housekeeping? Y  Comment family does cleaning  Some recent data might be hidden    Fall/Depression Screening: Fall Risk  08/20/2019 04/17/2019 02/20/2019  Falls in the past year? 1 1 0  Comment Last fall January 2021 fall December 2020 patient denies falls  Number falls in past yr: 1 0 -  Injury with Fall? 0 0 -  Risk for fall due to : History of fall(s);Impaired balance/gait;Impaired mobility;Impaired vision;Medication side effect History of fall(s);Medication side effect;Impaired balance/gait;Impaired mobility;Impaired vision Medication side effect;Impaired balance/gait;Impaired mobility;Impaired vision  Follow up Falls evaluation completed;Education provided;Falls prevention discussed Falls evaluation completed;Education provided;Falls prevention discussed Falls evaluation completed;Education provided;Falls prevention discussed   PHQ 2/9 Scores 11/01/2018 10/08/2018  PHQ - 2 Score 2 2  PHQ- 9 Score 2 2   THN CM Care Plan Problem One     Most Recent Value  Care Plan Problem One  Knowledge deficiet related to diabetes.  Role Documenting the Problem One  [place] for Problem One  Active  [facility]N Long Term Goal   Patient will maintain Hgb A1C of 7 or below within the next 90 days.  THN Long Term Goal Start Date  08/20/19  THN Long Term Goal Met Date  08/20/19  Interventions for Problem One Long Term Goal  Congratulated  patient on maintaining A1C of 7, encouraged to continue with low salt diabetic diet, fall precations and preventions reviewed and discussed, care plan and goals reviewed and discussed, reviewed medications and encouraged medication compliance, encouraged to weigh daily, reviewed signs and symptoms of heart failure, encouraged to keep and attend scheduled medical appointments, confirmed patient has transportation to upcoming medical appointment, encouraged to schedule appointment with cardiology (patient stating she believes she attended appointment beginning of this year, last documented last August in the system)     Appointments:  Attended appointment with primary care provider, Dr. Criss Rosales on 05/13/2019.  Patient stating she had appointment with primary today and was unable to attend due to no transportation.  States she rescheduled appointment for this Friday, May 14.  Stating her grandson will take her to appointment.  Plan: RN Health Coach will send primary care provider quarterly update. RN Health Coach will make next telephone outreach to patient within the month of August and patient agrees to future outreach.  [employer] Care Management  RN Health Coach  [phone] [email or web address] .Patsi Sears

## 2019-08-29 DIAGNOSIS — M1009 Idiopathic gout, multiple sites: Secondary | ICD-10-CM | POA: Diagnosis not present

## 2019-08-29 DIAGNOSIS — E039 Hypothyroidism, unspecified: Secondary | ICD-10-CM | POA: Diagnosis not present

## 2019-08-29 DIAGNOSIS — I130 Hypertensive heart and chronic kidney disease with heart failure and stage 1 through stage 4 chronic kidney disease, or unspecified chronic kidney disease: Secondary | ICD-10-CM | POA: Diagnosis not present

## 2019-08-29 DIAGNOSIS — E1169 Type 2 diabetes mellitus with other specified complication: Secondary | ICD-10-CM | POA: Diagnosis not present

## 2019-08-29 DIAGNOSIS — I5032 Chronic diastolic (congestive) heart failure: Secondary | ICD-10-CM | POA: Diagnosis not present

## 2019-09-17 DIAGNOSIS — E113293 Type 2 diabetes mellitus with mild nonproliferative diabetic retinopathy without macular edema, bilateral: Secondary | ICD-10-CM | POA: Diagnosis not present

## 2019-09-17 DIAGNOSIS — H348312 Tributary (branch) retinal vein occlusion, right eye, stable: Secondary | ICD-10-CM | POA: Diagnosis not present

## 2019-09-17 DIAGNOSIS — H353111 Nonexudative age-related macular degeneration, right eye, early dry stage: Secondary | ICD-10-CM | POA: Diagnosis not present

## 2019-09-17 DIAGNOSIS — H353221 Exudative age-related macular degeneration, left eye, with active choroidal neovascularization: Secondary | ICD-10-CM | POA: Diagnosis not present

## 2019-11-05 ENCOUNTER — Other Ambulatory Visit: Payer: Self-pay

## 2019-11-05 ENCOUNTER — Emergency Department (HOSPITAL_COMMUNITY)
Admission: EM | Admit: 2019-11-05 | Discharge: 2019-11-06 | Disposition: A | Discharge status: Home / Self Care | Payer: Medicare Other | Attending: Emergency Medicine | Admitting: Emergency Medicine

## 2019-11-05 ENCOUNTER — Emergency Department (HOSPITAL_COMMUNITY): Payer: Medicare Other

## 2019-11-05 DIAGNOSIS — R0602 Shortness of breath: Secondary | ICD-10-CM | POA: Diagnosis not present

## 2019-11-05 DIAGNOSIS — Z7901 Long term (current) use of anticoagulants: Secondary | ICD-10-CM | POA: Diagnosis not present

## 2019-11-05 DIAGNOSIS — I5032 Chronic diastolic (congestive) heart failure: Secondary | ICD-10-CM | POA: Diagnosis not present

## 2019-11-05 DIAGNOSIS — Z951 Presence of aortocoronary bypass graft: Secondary | ICD-10-CM | POA: Diagnosis not present

## 2019-11-05 DIAGNOSIS — J069 Acute upper respiratory infection, unspecified: Secondary | ICD-10-CM | POA: Diagnosis not present

## 2019-11-05 DIAGNOSIS — M542 Cervicalgia: Secondary | ICD-10-CM | POA: Diagnosis not present

## 2019-11-05 DIAGNOSIS — R05 Cough: Secondary | ICD-10-CM | POA: Insufficient documentation

## 2019-11-05 DIAGNOSIS — E039 Hypothyroidism, unspecified: Secondary | ICD-10-CM | POA: Insufficient documentation

## 2019-11-05 DIAGNOSIS — I2510 Atherosclerotic heart disease of native coronary artery without angina pectoris: Secondary | ICD-10-CM | POA: Insufficient documentation

## 2019-11-05 DIAGNOSIS — N182 Chronic kidney disease, stage 2 (mild): Secondary | ICD-10-CM | POA: Insufficient documentation

## 2019-11-05 DIAGNOSIS — I10 Essential (primary) hypertension: Secondary | ICD-10-CM

## 2019-11-05 DIAGNOSIS — N289 Disorder of kidney and ureter, unspecified: Secondary | ICD-10-CM | POA: Diagnosis not present

## 2019-11-05 DIAGNOSIS — Z955 Presence of coronary angioplasty implant and graft: Secondary | ICD-10-CM | POA: Insufficient documentation

## 2019-11-05 DIAGNOSIS — D649 Anemia, unspecified: Secondary | ICD-10-CM | POA: Insufficient documentation

## 2019-11-05 DIAGNOSIS — J9801 Acute bronchospasm: Secondary | ICD-10-CM | POA: Insufficient documentation

## 2019-11-05 DIAGNOSIS — Z20822 Contact with and (suspected) exposure to covid-19: Secondary | ICD-10-CM | POA: Insufficient documentation

## 2019-11-05 DIAGNOSIS — Z87891 Personal history of nicotine dependence: Secondary | ICD-10-CM | POA: Insufficient documentation

## 2019-11-05 DIAGNOSIS — I130 Hypertensive heart and chronic kidney disease with heart failure and stage 1 through stage 4 chronic kidney disease, or unspecified chronic kidney disease: Secondary | ICD-10-CM | POA: Insufficient documentation

## 2019-11-05 LAB — CBC
HCT: 34.5 % — ABNORMAL LOW (ref 36.0–46.0)
Hemoglobin: 11 g/dL — ABNORMAL LOW (ref 12.0–15.0)
MCH: 30.4 pg (ref 26.0–34.0)
MCHC: 31.9 g/dL (ref 30.0–36.0)
MCV: 95.3 fL (ref 80.0–100.0)
Platelets: 199 10*3/uL (ref 150–400)
RBC: 3.62 MIL/uL — ABNORMAL LOW (ref 3.87–5.11)
RDW: 12.2 % (ref 11.5–15.5)
WBC: 9 10*3/uL (ref 4.0–10.5)
nRBC: 0 % (ref 0.0–0.2)

## 2019-11-05 LAB — BASIC METABOLIC PANEL
Anion gap: 6 (ref 5–15)
BUN: 29 mg/dL — ABNORMAL HIGH (ref 8–23)
CO2: 28 mmol/L (ref 22–32)
Calcium: 8.5 mg/dL — ABNORMAL LOW (ref 8.9–10.3)
Chloride: 104 mmol/L (ref 98–111)
Creatinine, Ser: 1.33 mg/dL — ABNORMAL HIGH (ref 0.44–1.00)
GFR calc Af Amer: 42 mL/min — ABNORMAL LOW (ref >60)
GFR calc non Af Amer: 36 mL/min — ABNORMAL LOW (ref >60)
Glucose, Bld: 199 mg/dL — ABNORMAL HIGH (ref 70–99)
Potassium: 3.4 mmol/L — ABNORMAL LOW (ref 3.5–5.1)
Sodium: 138 mmol/L (ref 135–145)

## 2019-11-05 LAB — TROPONIN I (HIGH SENSITIVITY)
Troponin I (High Sensitivity): 15 ng/L (ref <18)
Troponin I (High Sensitivity): 13 ng/L (ref <18)

## 2019-11-05 MED ORDER — SODIUM CHLORIDE 0.9% FLUSH: 3.0000 mL | Freq: Once | INTRAVENOUS | Status: DC

## 2019-11-05 NOTE — ED Triage Notes (Signed)
Pt arrives to ED with c/ of productive cough time 1 week as well as right sided neck pain. Pt states she has had mild sob but no CP.

## 2019-11-06 LAB — CBG MONITORING, ED: Glucose-Capillary: 140 mg/dL — ABNORMAL HIGH (ref 70–99)

## 2019-11-06 LAB — POC SARS CORONAVIRUS 2 AG -  ED: SARS Coronavirus 2 Ag: NEGATIVE

## 2019-11-06 MED ORDER — PREDNISONE 20 MG PO TABS
ORAL_TABLET | ORAL | 0 refills | Status: DC
Start: 2019-11-06 — End: 2020-02-27

## 2019-11-06 MED ORDER — PREDNISONE 20 MG PO TABS: 60.0000 mg | ORAL_TABLET | Freq: Once | ORAL | Status: DC

## 2019-11-06 MED ORDER — METHOCARBAMOL 500 MG PO TABS
500.0000 mg | ORAL_TABLET | Freq: Once | ORAL | Status: AC
  Administered 2019-11-06: 500 mg via ORAL
  Filled 2019-11-06: qty 1

## 2019-11-06 MED ORDER — ALBUTEROL SULFATE HFA 108 (90 BASE) MCG/ACT IN AERS
4.0000 | INHALATION_SPRAY | Freq: Once | RESPIRATORY_TRACT | Status: AC
  Administered 2019-11-06: 4 via RESPIRATORY_TRACT
  Filled 2019-11-06: qty 6.7

## 2019-11-06 MED ORDER — IPRATROPIUM-ALBUTEROL 0.5-2.5 (3) MG/3ML IN SOLN
3.0000 mL | Freq: Once | RESPIRATORY_TRACT | Status: AC
  Administered 2019-11-06: 3 mL via RESPIRATORY_TRACT
  Filled 2019-11-06: qty 3

## 2019-11-06 MED ORDER — PREDNISONE 20 MG PO TABS
40.0000 mg | ORAL_TABLET | Freq: Once | ORAL | Status: AC
  Administered 2019-11-06: 40 mg via ORAL
  Filled 2019-11-06: qty 2

## 2019-11-06 MED ORDER — POTASSIUM CHLORIDE CRYS ER 20 MEQ PO TBCR
40.0000 meq | EXTENDED_RELEASE_TABLET | Freq: Once | ORAL | Status: AC
  Administered 2019-11-06: 40 meq via ORAL
  Filled 2019-11-06: qty 2

## 2019-11-06 NOTE — ED Notes (Signed)
Pt states she would like to leave.  

## 2019-11-06 NOTE — Discharge Instructions (Signed)
Use your nebulizer every four hours as needed to help with your cough.  Return if symptoms are getting worse.

## 2019-11-06 NOTE — ED Provider Notes (Signed)
[facility] EMERGENCY DEPARTMENT Provider Note   CSN: [id number] Arrival date & time: 11/05/19  1629   History Chief Complaint  Patient presents with  . Cough    [name] is a 86 y.o. female.  The history is provided by the patient.  Cough She has history of hypertension, diabetes, hyperlipidemia, chronic kidney disease, coronary artery disease, diastolic heart failure and comes in complaining of a cough for the last 3 days, and pain in the right side of her neck for the last 2 days.  Cough has been productive of some clear to slightly yellow sputum.  There has been minimal dyspnea.  She denies fever, chills, sweats.  She denies nausea or vomiting or diarrhea.  Her neck pain is on the right lateral side of the neck and radiates down into the shoulder area.  It is worse when she moves her head in certain ways.  She has been using her home nebulizer for her cough and it does give some temporary relief.  She denies any sick contacts.  She has received both doses of Covid vaccine with second dose being in March.  Past Medical History:  Diagnosis Date  . Allergic rhinitis   . Anemia   . Anxiety   . Barrett esophagus   . CAD (coronary artery disease) 2009   a. Multivessel s/p PCI w/DES 2009 // b. s/p CABG 2011  //  c. LHC 8/15: pLAD 95 ISR, LCx 100, pOM1 40, dRCA 100, S-OM1/OM2 ok, S-D1 ok, S-PDA ok, L-LAD ok, EF 60%  . Carotid artery disease ([place])    a. Carotid US 5/99: RICA 3-57%; LICA 01-77% >> FU 1 year  //  b. Carotid US 9/17: R 1-39%, L 40-59% >> FU 1 year  . Chronic diastolic heart failure ([place])   . CKD (chronic kidney disease), stage II    GFR 60-89 ml/min  . Depression   . Disc disease, degenerative, cervical   . Diverticulosis   . Gastroparesis   . GERD (gastroesophageal reflux disease)   . Gout   . H/O hiatal hernia   . Helicobacter pylori gastritis   . History of echocardiogram    a. Echo 11/13: EF 55% to 60%. Grade 2 diastolic dysfunction, MAC,  trivial MR, mild LAE, normal RVSF, mild RAE, PASP 39 mmHg  //  b. Echo 4/17: EF 55-60%, normal wall motion, trivial AI, MAC, moderate LAE, mild RVE, PASP 35 mmHg  . History of thrombocytopenia   . HTN (hypertension)   . Hyperlipidemia   . Hypothyroidism   . LBP (low back pain)    Lumbar disc disease/lumbar spinal stenosis  . Morbid obesity ([place])   . Myocardial infarction ([place])   . Osteoarthritis   . Osteopenia   . PVD (peripheral vascular disease) ([place])   . Sick sinus syndrome [facility])    MDT Dual-chamber PPM implant 02/2012  . Type II or unspecified type diabetes mellitus without mention of complication, not stated as uncontrolled     Patient Active Problem List   Diagnosis Date Noted  . Palpitations 01/15/2018  . Pacemaker 01/15/2018  . Heel spur, left 09/13/2017  . Pain in left knee 08/23/2017  . CKD (chronic kidney disease), stage III 12/04/2016  . Chest pain 11/23/2015  . Unstable angina ([place]) 11/29/2013  . Type I (juvenile type) diabetes mellitus with renal manifestations, not stated as uncontrolled(250.41) 04/27/2012  . Insulin dependent diabetes mellitus with complications 93/90/3009  . AKI (acute kidney injury) ([place]) 03/05/2012  .  Diarrhea 03/05/2012  . Carotid artery disease ([place]) 08/11/2010  . Left shoulder pain 07/08/2010  . Preventative health care 07/08/2010  . HOARSENESS 06/04/2010  . PRURITUS 05/11/2010  . ANEMIA-NOS 04/02/2010  . Chronic diastolic heart failure ([place]) 02/11/2010  . Acute on chronic diastolic heart failure ([place]) 11/26/2009  . SINUS BRADYCARDIA 10/30/2009  . ALLERGIC RHINITIS 09/11/2009  . BACK PAIN 09/11/2009  . MUSCLE STRAIN, RIGHT BUTTOCK 09/11/2009  . SHINGLES 05/11/2009  . SHOULDER PAIN, LEFT 12/29/2008  . Proteinuria 12/12/2008  . Abdominal pain, unspecified site 09/24/2007  . Hx of CABG 05/03/2007  . CHEST PAIN 04/20/2007  . DIZZINESS 02/28/2007  . ANXIETY 02/15/2007  . GERD 02/15/2007  . Gastroparesis 02/15/2007  . [place] DISEASE,  CERVICAL 02/15/2007  . [place] DISEASE, LUMBAR 02/15/2007  . SPINAL STENOSIS, LUMBAR 02/15/2007  . PERIPHERAL EDEMA 02/15/2007  . Personal History of Other Diseases of Digestive Disease 02/15/2007  . HYPERLIPIDEMIA 01/01/2007  . GOUT 01/01/2007  . Morbid obesity ([place]) 01/01/2007  . DEPRESSION 01/01/2007  . PERIPHERAL VASCULAR DISEASE 01/01/2007  . DIVERTICULOSIS, COLON 01/01/2007  . OSTEOARTHRITIS 01/01/2007  . LOW BACK PAIN 01/01/2007  . OSTEOPENIA 01/01/2007  . Essential hypertension 10/26/2006    Past Surgical History:  Procedure Laterality Date  . ABDOMINAL HYSTERECTOMY    . CARDIAC CATHETERIZATION     2011  DR COOPER (APPT NEXT WEEK)  . CHOLECYSTECTOMY    . CORONARY ARTERY BYPASS GRAFT  2011   LIMA-LAD, SVG-DIAG, SVG-OM1-OM2, SVG-PDA  . CORONARY STENT PLACEMENT     Drug-eluting stent to the left anterior descending, circumflex and right coronary artery in Jan 2009  . EYE SURGERY     BIL CATARACT REMOVAL 06/2010  . LEFT HEART CATHETERIZATION WITH CORONARY ANGIOGRAM N/A 12/02/2013   Procedure: LEFT HEART CATHETERIZATION WITH CORONARY ANGIOGRAM;  Surgeon: Sinclair Grooms, MD;  Location: [facility] CATH LAB;  Service: Cardiovascular;  Laterality: N/A;  . OVARIAN CYST REMOVAL    . PACEMAKER INSERTION  03/06/12   MDT Adapta L implanted by Dr Rayann Heman for SSS  . PERMANENT PACEMAKER INSERTION N/A 03/06/2012   Procedure: PERMANENT PACEMAKER INSERTION;  Surgeon: Thompson Grayer, MD;  Location: [facility] CATH LAB;  Service: Cardiovascular;  Laterality: N/A;  . SHOULDER ARTHROSCOPY  06/16/2011   Procedure: ARTHROSCOPY SHOULDER;  Surgeon: Sharmon Revere, MD;  Location: [facility];  Service: Orthopedics;  Laterality: Left;  LEFT SHOULDER ARTHROSCOPY ACROMIALPLASTY, POSSIBLE MINI OPEN CUFF REPAIR   . TUBAL LIGATION       OB History   No obstetric history on file.     Family History  Problem Relation Age of Onset  . Diabetes Mother   . Hypertension Mother   . Heart attack Mother   . Stroke Father   .  Coronary artery disease Other     Social History   Tobacco Use  . Smoking status: Former Research scientist (life sciences)  . Smokeless tobacco: Never Used  . Tobacco comment: quit 30 yrs ago  Vaping Use  . Vaping Use: Never used  Substance Use Topics  . Alcohol use: No  . Drug use: No    Home Medications Prior to Admission medications   Medication Sig Start Date End Date Taking? Authorizing Provider  acetaminophen (TYLENOL) 500 MG tablet Take 500 mg by mouth every 6 (six) hours as needed for headache (pain).    [provider]  allopurinol (ZYLOPRIM) 100 MG tablet Take 100 mg by mouth daily.    [provider]  amLODipine (NORVASC) 5 MG tablet  Take 1 tablet (5 mg total) by mouth daily. 11/14/18 11/14/19  Richardson Dopp T, PA-C  aspirin EC 81 MG tablet Take 81 mg by mouth daily.    [provider]  atorvastatin (LIPITOR) 20 MG tablet TAKE 1 TABLET BY MOUTH  DAILY 06/24/19   Sherren Mocha, MD  azelastine (OPTIVAR) 0.05 % ophthalmic solution Place 1 drop into both eyes 2 (two) times daily. 10/26/15   [provider]  carvedilol (COREG) 6.25 MG tablet Take 1 tablet (6.25 mg total) by mouth 2 (two) times daily. 04/10/19   Richardson Dopp T, PA-C  chlorthalidone (HYGROTON) 25 MG tablet Take 0.5 tablets (12.5 mg total) by mouth daily. 11/30/18   Richardson Dopp T, PA-C  clopidogrel (PLAVIX) 75 MG tablet TAKE 1 TABLET BY MOUTH  DAILY 04/10/19   Sherren Mocha, MD  colchicine 0.6 MG tablet Take 0.6 mg by mouth daily as needed (gout).     [provider]  furosemide (LASIX) 40 MG tablet Only take Mon, Wed and Friday with Potassium. 11/14/18   Richardson Dopp T, PA-C  insulin NPH Human (HUMULIN N,NOVOLIN N) 100 UNIT/ML injection Inject 5-45 Units into the skin See admin instructions. Inject 45 units subcutaneously every morning and 10 units at night    [provider]  levothyroxine (SYNTHROID) 50 MCG tablet Take 50 mcg by mouth every morning. 03/29/19   [provider]    levothyroxine (SYNTHROID, LEVOTHROID) 100 MCG tablet Take 1 tablet (100 mcg total) by mouth daily. Patient not taking: Reported on 04/17/2019 03/13/12   Renato Shin, MD  Multiple Vitamin (MULTIVITAMIN WITH MINERALS) TABS tablet Take 1 tablet by mouth daily.    [provider]  nitroGLYCERIN (NITROSTAT) 0.4 MG SL tablet DISSOLVE ONE TABLET UNDER THE TONGUE EVERY 5 MINUTES AS NEEDED FOR CHEST PAIN.  DO NOT EXCEED A TOTAL OF 3 DOSES IN 15 MINUTES 05/07/18   Sherren Mocha, MD  ONE TOUCH ULTRA TEST test strip 1 each by Other route daily as needed (blood sugar).  07/10/13   [provider]  oxyCODONE-acetaminophen (PERCOCET/ROXICET) 5-325 MG tablet Take 1 tablet by mouth daily as needed (pain).  04/24/17   [provider]  Polyvinyl Alcohol-Povidone (REFRESH OP) Place 1 drop into both eyes 4 (four) times daily as needed (dry eyes).    [provider]  potassium chloride (K-DUR) 10 MEQ tablet Only take Mon, Wed, and Friday with lasix. 11/14/18   Kathlen Mody, Scott T, PA-C  sacubitril-valsartan (ENTRESTO) 97-103 MG Take 1 tablet by mouth 2 (two) times daily.    [provider]    Allergies    Ciprofloxacin, Codeine, Hydrocodone, Penicillins, Shellfish allergy, Diltiazem hcl, Sulfonamide derivatives, Morphine and related, Lovastatin, and Metformin  Review of Systems   Review of Systems  Respiratory: Positive for cough.   All other systems reviewed and are negative.   Physical Exam Updated Vital Signs BP (!) 158/80 (BP Location: Left Arm)   Pulse 60   Temp 98.2 F (36.8 C) (Oral)   Resp 16   SpO2 100%   Physical Exam Vitals and nursing note reviewed.   86 year old female, resting comfortably and in no acute distress. Vital signs are significant for elevated blood pressure. Oxygen saturation is 100%, which is normal. Head is normocephalic and atraumatic. PERRLA, EOMI. Oropharynx is clear. Neck: There is mild spasm noted to the right sternocleidomastoid  muscle with tenderness to palpation.  There is mild pain on passive range of motion of the neck.  There is  no adenopathy or JVD. Back is nontender and there is no CVA tenderness. Lungs have diffuse expiratory wheezes without rales or rhonchi.  She is coughing almost constantly Chest is nontender. Heart has regular rate and rhythm without murmur. Abdomen is soft, flat, nontender without masses or hepatosplenomegaly and peristalsis is normoactive. Extremities have 2+ edema, full range of motion is present. Skin is warm and dry without rash. Neurologic: Mental status is normal, cranial nerves are intact, there are no motor or sensory deficits.  ED Results / Procedures / Treatments   Labs (all labs ordered are listed, but only abnormal results are displayed) Labs Reviewed  BASIC METABOLIC PANEL - Abnormal; Notable for the following components:      Result Value   Potassium 3.4 (*)    Glucose, Bld 199 (*)    BUN 29 (*)    Creatinine, Ser 1.33 (*)    Calcium 8.5 (*)    GFR calc non Af Amer 36 (*)    GFR calc Af Amer 42 (*)    All other components within normal limits  CBC - Abnormal; Notable for the following components:   RBC 3.62 (*)    Hemoglobin 11.0 (*)    HCT 34.5 (*)    All other components within normal limits  TROPONIN I (HIGH SENSITIVITY)  TROPONIN I (HIGH SENSITIVITY)    EKG EKG Interpretation  Date/Time:  Tuesday November 05 2019 16:56:54 EDT Ventricular Rate:  69 PR Interval:  200 QRS Duration: 196 QT Interval:  486 QTC Calculation: 520 R Axis:   -74 Text Interpretation: AV dual-paced rhythm Abnormal ECG When compared with ECG of 02/04/2018, Wide QRS complexes are now present indicating ventricular pacing Confirmed by Delora Fuel ([id number]) on 11/06/2019 12:20:46 AM   Radiology DG Chest 2 View  Result Date: 11/05/2019 CLINICAL DATA:  86 year old female with productive cough for 1 week. Right side neck pain. Shortness of breath. EXAM: CHEST - 2 VIEW COMPARISON:  Chest  radiographs 02/04/2018 and earlier. FINDINGS: Stable low lung volumes. Stable cardiac size and mediastinal contours. Prior CABG. Stable left chest dual lead cardiac pacemaker. Visualized tracheal air column is within normal limits. No pneumothorax, pulmonary edema, pleural effusion or confluent pulmonary opacity. No acute osseous abnormality identified. Negative visible bowel gas pattern. IMPRESSION: No acute cardiopulmonary abnormality. Electronically Signed   By: Genevie Ann M.D.   On: 11/05/2019 17:39    Procedures Procedures   Medications Ordered in ED Medications  sodium chloride flush (NS) 0.9 % injection 3 mL (3 mLs Intravenous Not Given 11/06/19 0134)  methocarbamol (ROBAXIN) tablet 500 mg (500 mg Oral Given 11/06/19 0118)  albuterol (VENTOLIN HFA) 108 (90 Base) MCG/ACT inhaler 4 puff (4 puffs Inhalation Given 11/06/19 0117)  predniSONE (DELTASONE) tablet 40 mg (40 mg Oral Given 11/06/19 0117)  potassium chloride SA (KLOR-CON) CR tablet 40 mEq (40 mEq Oral Given 11/06/19 0118)  ipratropium-albuterol (DUONEB) 0.5-2.5 (3) MG/3ML nebulizer solution 3 mL (3 mLs Nebulization Given 11/06/19 0236)  ipratropium-albuterol (DUONEB) 0.5-2.5 (3) MG/3ML nebulizer solution 3 mL (3 mLs Nebulization Given 11/06/19 3086)    ED Course  I have reviewed the triage vital signs and the nursing notes.  Pertinent labs & imaging results that were available during my care of the patient were reviewed by me and considered in my medical decision making (see chart for details).  MDM Rules/Calculators/A&P Cough which appears to be a viral respiratory tract infection with bronchospasm.  Chest x-ray shows no evidence of pneumonia.  Neck pain appears to  be torticollis.  Old records are reviewed, and she does have a prior ED visit for pneumonia.  We will give albuterol via inhaler and give initial dose of prednisone.  She is also given a dose of methocarbamol to help treat her cervical muscle spasm.  Labs show stable anemia,  renal insufficiency that is actually improved over baseline, borderline hypokalemia and she is given a dose of oral potassium.  Covid antigen is come back negative.  After albuterol inhaler, she was given an nebulizer treatment with albuterol and ipratropium.  She noted significant improvement with this, but there was still some residual wheezing.  She is given a second nebulizer treatment with further improvement.  At this point, she was felt to be safe for discharge.  She is discharged with prescription for prednisone, advised to continue using her home nebulizer as needed.  Return precautions discussed.  Elysia Grand Nathanson was evaluated in Emergency Department on 11/06/2019 for the symptoms described in the history of present illness. She was evaluated in the context of the global COVID-19 pandemic, which necessitated consideration that the patient might be at risk for infection with the SARS-CoV-2 virus that causes COVID-19. Institutional protocols and algorithms that pertain to the evaluation of patients at risk for COVID-19 are in a state of rapid change based on information released by regulatory bodies including the CDC and federal and state organizations. These policies and algorithms were followed during the patient's care in the ED.  Final Clinical Impression(s) / ED Diagnoses Final diagnoses:  Viral URI with cough  Bronchospasm  Renal insufficiency  Normochromic normocytic anemia  Elevated blood pressure reading with diagnosis of hypertension    Rx / DC Orders ED Discharge Orders         Ordered    predniSONE (DELTASONE) 20 MG tablet     Discontinue  Reprint     11/06/19 1601           Delora Fuel, MD [birth year] [phone]

## 2019-11-13 ENCOUNTER — Other Ambulatory Visit: Payer: Self-pay | Admitting: *Deleted

## 2019-11-13 NOTE — Patient Outreach (Signed)
[facility]) Care Management  11/13/2019  [name] 02-20-1933 [id number]   RN Health CoachQuarterlyOutreach  Referral Date: 10/08/2018 Referral Source: EMMI Prevent Screening Reason for Referral: Disease Management Education Insurance: [employer]   Outreach Attempt:  Outreach attempt #1 to patient for follow up. No answer and unable to leave voicemail message due to voicemail not set up.  Plan:  RN Health Coach will make another outreach attempt within the month of September if no return call back from patient.   Rhae Lerner RN [facility] Care Management  RN Health Coach [phone] [name]

## 2019-11-20 DIAGNOSIS — I509 Heart failure, unspecified: Secondary | ICD-10-CM | POA: Diagnosis not present

## 2019-11-20 DIAGNOSIS — E118 Type 2 diabetes mellitus with unspecified complications: Secondary | ICD-10-CM | POA: Diagnosis not present

## 2019-11-20 DIAGNOSIS — Z0000 Encounter for general adult medical examination without abnormal findings: Secondary | ICD-10-CM | POA: Diagnosis not present

## 2019-11-20 DIAGNOSIS — I130 Hypertensive heart and chronic kidney disease with heart failure and stage 1 through stage 4 chronic kidney disease, or unspecified chronic kidney disease: Secondary | ICD-10-CM | POA: Diagnosis not present

## 2019-11-20 DIAGNOSIS — E039 Hypothyroidism, unspecified: Secondary | ICD-10-CM | POA: Diagnosis not present

## 2019-11-20 DIAGNOSIS — I10 Essential (primary) hypertension: Secondary | ICD-10-CM | POA: Diagnosis not present

## 2019-11-25 DIAGNOSIS — E1122 Type 2 diabetes mellitus with diabetic chronic kidney disease: Secondary | ICD-10-CM | POA: Diagnosis not present

## 2019-11-25 DIAGNOSIS — R600 Localized edema: Secondary | ICD-10-CM | POA: Diagnosis not present

## 2019-11-26 ENCOUNTER — Other Ambulatory Visit: Payer: Self-pay | Admitting: Cardiovascular Disease

## 2019-11-26 DIAGNOSIS — E785 Hyperlipidemia, unspecified: Secondary | ICD-10-CM

## 2019-11-28 ENCOUNTER — Ambulatory Visit (INDEPENDENT_AMBULATORY_CARE_PROVIDER_SITE_OTHER): Payer: Medicare Other | Admitting: *Deleted

## 2019-11-28 DIAGNOSIS — I495 Sick sinus syndrome: Secondary | ICD-10-CM

## 2019-11-29 LAB — CUP PACEART REMOTE DEVICE CHECK
Battery Impedance: 1261 Ohm
Battery Remaining Longevity: 45 mo
Battery Voltage: 2.76 V
Brady Statistic AP VP Percent: 82 %
Brady Statistic AP VS Percent: 5 %
Brady Statistic AS VP Percent: 11 %
Brady Statistic AS VS Percent: 1 %
Date Time Interrogation Session: 20210819143532
Implantable Lead Implant Date: 20131126
Implantable Lead Implant Date: 20131126
Implantable Lead Location: 753859
Implantable Lead Location: 753860
Implantable Lead Model: 5076
Implantable Lead Model: 5092
Implantable Pulse Generator Implant Date: 20131126
Lead Channel Impedance Value: 421 Ohm
Lead Channel Impedance Value: 443 Ohm
Lead Channel Pacing Threshold Amplitude: 0.5 V
Lead Channel Pacing Threshold Amplitude: 1.125 V
Lead Channel Pacing Threshold Pulse Width: 0.4 ms
Lead Channel Pacing Threshold Pulse Width: 0.4 ms
Lead Channel Setting Pacing Amplitude: 2 V
Lead Channel Setting Pacing Amplitude: 2.5 V
Lead Channel Setting Pacing Pulse Width: 0.4 ms
Lead Channel Setting Sensing Sensitivity: 2 mV

## 2019-11-29 NOTE — Progress Notes (Signed)
Remote pacemaker transmission.   

## 2019-12-10 DIAGNOSIS — I5032 Chronic diastolic (congestive) heart failure: Secondary | ICD-10-CM | POA: Diagnosis not present

## 2019-12-10 DIAGNOSIS — N1831 Chronic kidney disease, stage 3a: Secondary | ICD-10-CM | POA: Diagnosis not present

## 2019-12-10 DIAGNOSIS — I130 Hypertensive heart and chronic kidney disease with heart failure and stage 1 through stage 4 chronic kidney disease, or unspecified chronic kidney disease: Secondary | ICD-10-CM | POA: Diagnosis not present

## 2019-12-10 DIAGNOSIS — E1122 Type 2 diabetes mellitus with diabetic chronic kidney disease: Secondary | ICD-10-CM | POA: Diagnosis not present

## 2019-12-18 ENCOUNTER — Other Ambulatory Visit: Payer: Self-pay | Admitting: *Deleted

## 2019-12-18 NOTE — Patient Outreach (Signed)
[facility]) Care Management  12/18/2019  [name] Oct 09, 1932 [id number]   RN Health CoachQuarterlyOutreach  Referral Date: 10/08/2018 Referral Source: EMMI Prevent Screening Reason for Referral: Disease Management Education Insurance: [employer]   Outreach Attempt: Outreach attempt #2 to patient for follow up. No answer and unable to leave voicemail message due to voicemail box not being set up.  Plan:  RN Health Coach will make another outreach attempt within the month of October if no return call back from patient.   Rhae Lerner RN [facility] Care Management  RN Health Coach [phone] [email or web address]

## 2019-12-20 ENCOUNTER — Ambulatory Visit: Payer: Self-pay | Admitting: *Deleted

## 2020-01-06 DIAGNOSIS — Z23 Encounter for immunization: Secondary | ICD-10-CM | POA: Diagnosis not present

## 2020-01-06 DIAGNOSIS — I110 Hypertensive heart disease with heart failure: Secondary | ICD-10-CM | POA: Diagnosis not present

## 2020-01-06 DIAGNOSIS — E1169 Type 2 diabetes mellitus with other specified complication: Secondary | ICD-10-CM | POA: Diagnosis not present

## 2020-01-09 DIAGNOSIS — N1831 Chronic kidney disease, stage 3a: Secondary | ICD-10-CM | POA: Diagnosis not present

## 2020-01-09 DIAGNOSIS — I130 Hypertensive heart and chronic kidney disease with heart failure and stage 1 through stage 4 chronic kidney disease, or unspecified chronic kidney disease: Secondary | ICD-10-CM | POA: Diagnosis not present

## 2020-01-09 DIAGNOSIS — I5032 Chronic diastolic (congestive) heart failure: Secondary | ICD-10-CM | POA: Diagnosis not present

## 2020-01-09 DIAGNOSIS — E1122 Type 2 diabetes mellitus with diabetic chronic kidney disease: Secondary | ICD-10-CM | POA: Diagnosis not present

## 2020-01-14 DIAGNOSIS — H353111 Nonexudative age-related macular degeneration, right eye, early dry stage: Secondary | ICD-10-CM | POA: Diagnosis not present

## 2020-01-14 DIAGNOSIS — H353221 Exudative age-related macular degeneration, left eye, with active choroidal neovascularization: Secondary | ICD-10-CM | POA: Diagnosis not present

## 2020-01-14 DIAGNOSIS — E113293 Type 2 diabetes mellitus with mild nonproliferative diabetic retinopathy without macular edema, bilateral: Secondary | ICD-10-CM | POA: Diagnosis not present

## 2020-01-14 DIAGNOSIS — H348312 Tributary (branch) retinal vein occlusion, right eye, stable: Secondary | ICD-10-CM | POA: Diagnosis not present

## 2020-01-23 ENCOUNTER — Other Ambulatory Visit: Payer: Self-pay | Admitting: Physician Assistant

## 2020-01-23 ENCOUNTER — Other Ambulatory Visit: Payer: Self-pay | Admitting: Cardiovascular Disease

## 2020-01-23 DIAGNOSIS — E1169 Type 2 diabetes mellitus with other specified complication: Secondary | ICD-10-CM | POA: Diagnosis not present

## 2020-01-23 DIAGNOSIS — R609 Edema, unspecified: Secondary | ICD-10-CM | POA: Diagnosis not present

## 2020-01-24 ENCOUNTER — Other Ambulatory Visit: Payer: Self-pay | Admitting: *Deleted

## 2020-01-24 MED ORDER — POTASSIUM CHLORIDE ER 10 MEQ PO TBCR: EXTENDED_RELEASE_TABLET | ORAL | 0 refills | Status: DC

## 2020-01-24 MED ORDER — FUROSEMIDE 40 MG PO TABS: ORAL_TABLET | ORAL | 0 refills | Status: DC

## 2020-01-24 NOTE — Patient Outreach (Signed)
[facility]) Care Management  [facility] Care Manager  01/24/2020   [name] 30-Aug-1932 [id number]   RN Health CoachQuarterlyOutreach  Referral Date: 10/08/2018 Referral Source: EMMI Prevent Screening Reason for Referral: Disease Management Education Insurance: [employer]    Outreach Attempt:  Outreach attempt #3 to patient for follow up.  Patient answered and stated she was unable to talk at this time, requested call back another time.   Plan:  RN Health Coach will make another outreach attempt within the month of November.   Rhae Lerner RN [facility] Care Management  RN Health Coach [phone] [name]

## 2020-01-27 ENCOUNTER — Ambulatory Visit: Payer: Medicare Other | Attending: Internal Medicine

## 2020-01-27 ENCOUNTER — Other Ambulatory Visit (HOSPITAL_BASED_OUTPATIENT_CLINIC_OR_DEPARTMENT_OTHER): Payer: Self-pay | Admitting: Internal Medicine

## 2020-01-27 DIAGNOSIS — Z23 Encounter for immunization: Secondary | ICD-10-CM

## 2020-01-27 NOTE — Progress Notes (Signed)
   Covid-19 Vaccination Clinic  Name:  [name]    MRN: [id number] DOB: 04-15-1932  01/27/2020  [name] was observed post Covid-19 immunization for 15 minutes without incident. She was provided with Vaccine Information Sheet and instruction to access the V-Safe system.   [name] was instructed to call 911 with any severe reactions post vaccine: Marland Kitchen Difficulty breathing  . Swelling of face and throat  . A fast heartbeat  . A bad rash all over body  . Dizziness and weakness

## 2020-02-04 MED FILL — PFIZER-BIONTECH COVID-19 VA: 30 | 1 days supply | Qty: 0 | Fill #0

## 2020-02-18 DIAGNOSIS — I130 Hypertensive heart and chronic kidney disease with heart failure and stage 1 through stage 4 chronic kidney disease, or unspecified chronic kidney disease: Secondary | ICD-10-CM | POA: Diagnosis not present

## 2020-02-18 DIAGNOSIS — I1310 Hypertensive heart and chronic kidney disease without heart failure, with stage 1 through stage 4 chronic kidney disease, or unspecified chronic kidney disease: Secondary | ICD-10-CM | POA: Diagnosis not present

## 2020-02-18 DIAGNOSIS — N189 Chronic kidney disease, unspecified: Secondary | ICD-10-CM | POA: Diagnosis not present

## 2020-02-18 DIAGNOSIS — I110 Hypertensive heart disease with heart failure: Secondary | ICD-10-CM | POA: Diagnosis not present

## 2020-02-18 DIAGNOSIS — E119 Type 2 diabetes mellitus without complications: Secondary | ICD-10-CM | POA: Diagnosis not present

## 2020-02-21 ENCOUNTER — Other Ambulatory Visit: Payer: Self-pay | Admitting: Cardiovascular Disease

## 2020-02-24 ENCOUNTER — Other Ambulatory Visit: Payer: Self-pay | Admitting: *Deleted

## 2020-02-24 ENCOUNTER — Encounter: Payer: Self-pay | Admitting: *Deleted

## 2020-02-24 NOTE — Patient Outreach (Signed)
[facility]) Care Management  [facility] Care Manager  02/24/2020   MEILYN HEINDL 1932/12/13 [id number]   RN Health CoachQuarterlyOutreach  Referral Date: 10/08/2018 Referral Source: EMMI Prevent Screening Reason for Referral: Disease Management Education Insurance: [employer]   Outreach Attempt:  Successful telephone outreach to patient for follow up.  HIPAA verified with patient.  Patient reporting she is doing fair, considering.  States she has had multiple deaths in her family recently.  Discussed [facility] Social Work referral for grief counseling and patient declines at this time.  Did not take fasting blood sugar this morning, but reports fasting ranges of 98-120's recently.  Latest Hgb A1C decreased to 6.7.  Denies any hypoglycemic episodes and states she rarely has to take insulin due to controlled blood sugars.  Denies any falls or sick days.  Admits to not weighing daily.  Discussed importance of daily weight monitoring and when to call provider based on weight.  Patient complaining of elbow pain that started yesterday.  Encouraged to take prn gout medication an to contact provider if pain persist.   Encounter Medications:  Outpatient Encounter Medications as of 02/24/2020  Medication Sig Note   acetaminophen (TYLENOL) 500 MG tablet Take 500 mg by mouth every 6 (six) hours as needed for headache (pain).    allopurinol (ZYLOPRIM) 100 MG tablet Take 100 mg by mouth at bedtime.     aspirin EC 81 MG tablet Take 81 mg by mouth at bedtime.     atorvastatin (LIPITOR) 20 MG tablet TAKE 1 TABLET BY MOUTH  DAILY    azelastine (OPTIVAR) 0.05 % ophthalmic solution Place 1 drop into both eyes 2 (two) times daily.    carvedilol (COREG) 6.25 MG tablet Take 1 tablet (6.25 mg total) by mouth 2 (two) times daily.    clopidogrel (PLAVIX) 75 MG tablet TAKE 1 TABLET BY MOUTH  DAILY (Patient taking differently: Take 75 mg by mouth at bedtime. )    colchicine  0.6 MG tablet Take 0.6 mg by mouth daily as needed (gout).     dapagliflozin propanediol (FARXIGA) 5 MG TABS tablet Take 5 mg by mouth daily.    furosemide (LASIX) 40 MG tablet TAKE 1 TABLET BY MOUTH  MONDAY, WEDNESDAY, AND  FRIDAY WITH POTASSIUM.    Multiple Vitamin (MULTIVITAMIN WITH MINERALS) TABS tablet Take 1 tablet by mouth at bedtime.     potassium chloride (KLOR-CON) 10 MEQ tablet Only take Mon, Wed, and Friday with lasix. Please make overdue appt with Dr. Excell Seltzer before anymore refills. 1st attempt    sacubitril-valsartan (ENTRESTO) 97-103 MG Take 1 tablet by mouth 2 (two) times daily.    amLODipine (NORVASC) 5 MG tablet Take 1 tablet (5 mg total) by mouth daily. (Patient taking differently: Take 5 mg by mouth at bedtime. ) 02/24/2020: Continues to take nightly   chlorthalidone (HYGROTON) 25 MG tablet Take 0.5 tablets (12.5 mg total) by mouth daily. (Patient not taking: Reported on 11/06/2019)    insulin NPH Human (HUMULIN N,NOVOLIN N) 100 UNIT/ML injection Inject 5-45 Units into the skin See admin instructions. Inject 45 units subcutaneously every morning and 10 units at night 02/24/2020: Reports taking as needed   levothyroxine (SYNTHROID, LEVOTHROID) 100 MCG tablet Take 1 tablet (100 mcg total) by mouth daily. (Patient not taking: Reported on 04/17/2019)    nitroGLYCERIN (NITROSTAT) 0.4 MG SL tablet DISSOLVE ONE TABLET UNDER THE TONGUE EVERY 5 MINUTES AS NEEDED FOR CHEST PAIN.  DO NOT EXCEED A TOTAL OF 3 DOSES  IN 15 MINUTES (Patient taking differently: Place 0.4 mg under the tongue every 5 (five) minutes as needed for chest pain. )    ONE TOUCH ULTRA TEST test strip 1 each by Other route daily as needed (blood sugar).     oxyCODONE-acetaminophen (PERCOCET/ROXICET) 5-325 MG tablet Take 1 tablet by mouth daily as needed (pain).  02/24/2020: Hasn't taken in a long time   Polyvinyl Alcohol-Povidone (REFRESH OP) Place 1 drop into both eyes 4 (four) times daily as needed (dry eyes).     potassium chloride (KLOR-CON) 10 MEQ tablet TAKE 1 TABLET BY MOUTH  MONDAY, WEDNESDAY, AND  FRIDAY WITH LASIX.    predniSONE (DELTASONE) 20 MG tablet 2 tabs po daily x 5 days (Patient not taking: Reported on 02/24/2020) 02/24/2020: Reports not taking at this time   Facility-Administered Encounter Medications as of 02/24/2020  Medication   betamethasone acetate-betamethasone sodium phosphate (CELESTONE) injection 3 mg    Functional Status:  No flowsheet data found.  Fall/Depression Screening: Fall Risk  02/24/2020 08/20/2019 04/17/2019  Falls in the past year? 1 1 1   Comment last fall Jnuary 2021 Last fall January 2021 fall December 2020  Number falls in past yr: 1 1 0  Injury with Fall? 0 0 0  Risk for fall due to : History of fall(s);Impaired balance/gait;Impaired mobility;Impaired vision;Medication side effect History of fall(s);Impaired balance/gait;Impaired mobility;Impaired vision;Medication side effect History of fall(s);Medication side effect;Impaired balance/gait;Impaired mobility;Impaired vision  Follow up Falls evaluation completed;Education provided;Falls prevention discussed Falls evaluation completed;Education provided;Falls prevention discussed Falls evaluation completed;Education provided;Falls prevention discussed   PHQ 2/9 Scores 02/24/2020 11/01/2018 10/08/2018  PHQ - 2 Score 0 2 2  PHQ- 9 Score - 2 2    Goals Addressed            This Visit's Progress    [facility]) Learn More About My Health       Follow Up Date 05/11/20   - tell my story and reason for my visit - make a list of questions - ask questions - repeat what I heard to make sure I understand - bring a list of my medicines to the visit - speak up when I don't understand     Why is this important?   The best way to learn about your health and care is by talking to the doctor and nurse.  They will answer your questions and give you information in the way that you like best.    Notes:      [facility]) Make  and Keep All Appointments       Follow Up Date 05/11/20   - ask family or friend for a ride - call to cancel if needed - keep a calendar with appointment dates -if elbow pain not relieved with colchicine, contact provider for appointment    Why is this important?   Part of staying healthy is seeing the doctor for follow-up care.  If you forget your appointments, there are some things you can do to stay on track.    Notes:      [facility]) Monitor and Manage My Blood Sugar       Follow Up Date 07/09/20   - check blood sugar at prescribed times - check blood sugar if I feel it is too high or too low - enter blood sugar readings and medication or insulin into daily log - take the blood sugar log to all doctor visits - take the blood sugar meter to all doctor visits  Why is this important?   Checking your blood sugar at home helps to keep it from getting very high or very low.  Writing the results in a diary or log helps the doctor know how to care for you.  Your blood sugar log should have the time, date and the results.  Also, write down the amount of insulin or other medicine that you take.  Other information, like what you ate, exercise done and how you were feeling, will also be helpful.     Notes:      [facility]) Set My Target A1C       Follow Up Date 07/09/20   - set target A1C; goal 7; current 6.7    Why is this important?   Your target A1C is decided together by you and your doctor.  It is based on several things like your age and other health issues.    Notes:      [facility]) Track and Manage Fluids and Swelling       Follow Up Date 07/09/20   - call office if I gain more than 2 pounds in one day or 5 pounds in one week - keep legs up while sitting - track weight in diary - use salt in moderation - watch for swelling in feet, ankles and legs every day - weigh myself daily -discuss with Cardiologist need for daily weight monitoring    Why is this important?   It is  important to check your weight daily and watch how much salt and liquids you have.  It will help you to manage your heart failure.    Notes:       Appointments:  Attended appointment with primary care provider, Dr. Parke Simmers on 02/18/2020.  Has scheduled Cardiology appointment on 02/27/20.  Plan: RN Health Coach will send primary care provider quarterly update. RN Health Coach will make next telephone outreach to patient within the month of January and patient agrees to future outreach.  Rhae Lerner RN [facility] Care Management  RN Health Coach [phone] [email or web address]

## 2020-02-24 NOTE — Patient Instructions (Signed)
Goals Addressed            This Visit's Progress   . [facility]) Learn More About My Health       Follow Up Date 05/11/20   - tell my story and reason for my visit - make a list of questions - ask questions - repeat what I heard to make sure I understand - bring a list of my medicines to the visit - speak up when I don't understand     Why is this important?   The best way to learn about your health and care is by talking to the doctor and nurse.  They will answer your questions and give you information in the way that you like best.    Notes:     . [facility]) Make and Keep All Appointments       Follow Up Date 05/11/20   - ask family or friend for a ride - call to cancel if needed - keep a calendar with appointment dates -if elbow pain not relieved with colchicine, contact provider for appointment    Why is this important?   Part of staying healthy is seeing the doctor for follow-up care.  If you forget your appointments, there are some things you can do to stay on track.    Notes:     . [facility]) Monitor and Manage My Blood Sugar       Follow Up Date 07/09/20   - check blood sugar at prescribed times - check blood sugar if I feel it is too high or too low - enter blood sugar readings and medication or insulin into daily log - take the blood sugar log to all doctor visits - take the blood sugar meter to all doctor visits    Why is this important?   Checking your blood sugar at home helps to keep it from getting very high or very low.  Writing the results in a diary or log helps the doctor know how to care for you.  Your blood sugar log should have the time, date and the results.  Also, write down the amount of insulin or other medicine that you take.  Other information, like what you ate, exercise done and how you were feeling, will also be helpful.     Notes:     . [facility]) Set My Target A1C       Follow Up Date 07/09/20   - set target A1C; goal 7; current 6.7    Why is this  important?   Your target A1C is decided together by you and your doctor.  It is based on several things like your age and other health issues.    Notes:     . [facility]) Track and Manage Fluids and Swelling       Follow Up Date 07/09/20   - call office if I gain more than 2 pounds in one day or 5 pounds in one week - keep legs up while sitting - track weight in diary - use salt in moderation - watch for swelling in feet, ankles and legs every day - weigh myself daily -discuss with Cardiologist need for daily weight monitoring    Why is this important?   It is important to check your weight daily and watch how much salt and liquids you have.  It will help you to manage your heart failure.    Notes:

## 2020-02-27 ENCOUNTER — Other Ambulatory Visit: Payer: Self-pay

## 2020-02-27 ENCOUNTER — Encounter: Payer: Self-pay | Admitting: Nurse Practitioner

## 2020-02-27 ENCOUNTER — Ambulatory Visit: Payer: Medicare Other | Admitting: Nurse Practitioner

## 2020-02-27 VITALS — BP 166/62 | HR 60 | Ht 67.0 in | Wt 198.8 lb

## 2020-02-27 DIAGNOSIS — I5032 Chronic diastolic (congestive) heart failure: Secondary | ICD-10-CM | POA: Diagnosis not present

## 2020-02-27 DIAGNOSIS — I2510 Atherosclerotic heart disease of native coronary artery without angina pectoris: Secondary | ICD-10-CM

## 2020-02-27 DIAGNOSIS — I10 Essential (primary) hypertension: Secondary | ICD-10-CM

## 2020-02-27 DIAGNOSIS — I495 Sick sinus syndrome: Secondary | ICD-10-CM

## 2020-02-27 LAB — CUP PACEART INCLINIC DEVICE CHECK
Date Time Interrogation Session: 20211118110202
Implantable Lead Implant Date: 20131126
Implantable Lead Implant Date: 20131126
Implantable Lead Location: 753859
Implantable Lead Location: 753860
Implantable Lead Model: 5076
Implantable Lead Model: 5092
Implantable Pulse Generator Implant Date: 20131126

## 2020-02-27 MED ORDER — NITROGLYCERIN 0.4 MG SL SUBL: SUBLINGUAL_TABLET | SUBLINGUAL | 3 refills | Status: AC

## 2020-02-27 NOTE — Progress Notes (Signed)
Electrophysiology Office Note Date: 02/27/2020  ID:  [name], DOB [birth year], MRN [id number]  PCP: Lucianne Lei, MD Primary Cardiologist: Burt Knack Electrophysiologist: Allred  CC: Pacemaker follow-up  [name] is a 86 y.o. female seen today for Dr Rayann Heman.  She presents today for routine electrophysiology followup.  Since last being seen in our clinic, the patient reports doing reasonably well.  She has lost several family members which has increased her stress.  She denies chest pain, palpitations, dyspnea, PND, orthopnea, nausea, vomiting, dizziness, syncope, edema, weight gain, or early satiety.  Device History: MDT dual chamber PPM implanted 2013 for SSS   Past Medical History:  Diagnosis Date  . Allergic rhinitis   . Anemia   . Anxiety   . Barrett esophagus   . CAD (coronary artery disease) 2009   a. Multivessel s/p PCI w/DES 2009 // b. s/p CABG 2011  //  c. LHC 8/15: pLAD 95 ISR, LCx 100, pOM1 40, dRCA 100, S-OM1/OM2 ok, S-D1 ok, S-PDA ok, L-LAD ok, EF 60%  . Carotid artery disease ([place])    a. Carotid US 6/71: RICA 2-45%; LICA 80-99% >> FU 1 year  //  b. Carotid US 9/17: R 1-39%, L 40-59% >> FU 1 year  . Chronic diastolic heart failure ([place])   . CKD (chronic kidney disease), stage II    GFR 60-89 ml/min  . Depression   . Disc disease, degenerative, cervical   . Diverticulosis   . Gastroparesis   . GERD (gastroesophageal reflux disease)   . Gout   . H/O hiatal hernia   . Helicobacter pylori gastritis   . History of echocardiogram    a. Echo 11/13: EF 55% to 60%. Grade 2 diastolic dysfunction, MAC, trivial MR, mild LAE, normal RVSF, mild RAE, PASP 39 mmHg  //  b. Echo 4/17: EF 55-60%, normal wall motion, trivial AI, MAC, moderate LAE, mild RVE, PASP 35 mmHg  . History of thrombocytopenia   . HTN (hypertension)   . Hyperlipidemia   . Hypothyroidism   . LBP (low back pain)    Lumbar disc disease/lumbar spinal stenosis  . Morbid obesity ([place])   .  Myocardial infarction ([place])   . Osteoarthritis   . Osteopenia   . PVD (peripheral vascular disease) ([place])   . Sick sinus syndrome [facility])    MDT Dual-chamber PPM implant 02/2012  . Type II or unspecified type diabetes mellitus without mention of complication, not stated as uncontrolled    Past Surgical History:  Procedure Laterality Date  . ABDOMINAL HYSTERECTOMY    . CARDIAC CATHETERIZATION     2011  DR COOPER (APPT NEXT WEEK)  . CHOLECYSTECTOMY    . CORONARY ARTERY BYPASS GRAFT  2011   LIMA-LAD, SVG-DIAG, SVG-OM1-OM2, SVG-PDA  . CORONARY STENT PLACEMENT     Drug-eluting stent to the left anterior descending, circumflex and right coronary artery in Jan 2009  . EYE SURGERY     BIL CATARACT REMOVAL 06/2010  . LEFT HEART CATHETERIZATION WITH CORONARY ANGIOGRAM N/A 12/02/2013   Procedure: LEFT HEART CATHETERIZATION WITH CORONARY ANGIOGRAM;  Surgeon: Sinclair Grooms, MD;  Location: [facility] CATH LAB;  Service: Cardiovascular;  Laterality: N/A;  . OVARIAN CYST REMOVAL    . PACEMAKER INSERTION  03/06/12   MDT Adapta L implanted by Dr Rayann Heman for SSS  . PERMANENT PACEMAKER INSERTION N/A 03/06/2012   Procedure: PERMANENT PACEMAKER INSERTION;  Surgeon: Thompson Grayer, MD;  Location: [facility] CATH LAB;  Service: Cardiovascular;  Laterality:  N/A;  . SHOULDER ARTHROSCOPY  06/16/2011   Procedure: ARTHROSCOPY SHOULDER;  Surgeon: Sharmon Revere, MD;  Location: [facility];  Service: Orthopedics;  Laterality: Left;  LEFT SHOULDER ARTHROSCOPY ACROMIALPLASTY, POSSIBLE MINI OPEN CUFF REPAIR   . TUBAL LIGATION      Current Outpatient Medications  Medication Sig Dispense Refill  . acetaminophen (TYLENOL) 500 MG tablet Take 500 mg by mouth every 6 (six) hours as needed for headache (pain).    Marland Kitchen allopurinol (ZYLOPRIM) 100 MG tablet Take 100 mg by mouth at bedtime.     Marland Kitchen amLODipine (NORVASC) 5 MG tablet Take 1 tablet (5 mg total) by mouth daily. 90 tablet 3  . aspirin EC 81 MG tablet Take 81 mg by mouth at bedtime.     Marland Kitchen  atorvastatin (LIPITOR) 20 MG tablet TAKE 1 TABLET BY MOUTH  DAILY 30 tablet 0  . azelastine (OPTIVAR) 0.05 % ophthalmic solution Place 1 drop into both eyes 2 (two) times daily.    . carvedilol (COREG) 6.25 MG tablet Take 1 tablet (6.25 mg total) by mouth 2 (two) times daily. 180 tablet 3  . clopidogrel (PLAVIX) 75 MG tablet TAKE 1 TABLET BY MOUTH  DAILY 90 tablet 2  . dapagliflozin propanediol (FARXIGA) 5 MG TABS tablet Take 5 mg by mouth daily.    . furosemide (LASIX) 40 MG tablet TAKE 1 TABLET BY MOUTH  MONDAY, WEDNESDAY, AND  FRIDAY WITH POTASSIUM. 15 tablet 0  . insulin NPH Human (HUMULIN N,NOVOLIN N) 100 UNIT/ML injection Inject 5-45 Units into the skin See admin instructions. Inject 45 units subcutaneously every morning and 10 units at night    . Multiple Vitamin (MULTIVITAMIN WITH MINERALS) TABS tablet Take 1 tablet by mouth at bedtime.     . nitroGLYCERIN (NITROSTAT) 0.4 MG SL tablet DISSOLVE ONE TABLET UNDER THE TONGUE EVERY 5 MINUTES AS NEEDED FOR CHEST PAIN.  DO NOT EXCEED A TOTAL OF 3 DOSES IN 15 MINUTES 25 tablet 3  . ONE TOUCH ULTRA TEST test strip 1 each by Other route daily as needed (blood sugar).     Marland Kitchen oxyCODONE-acetaminophen (PERCOCET/ROXICET) 5-325 MG tablet Take 1 tablet by mouth daily as needed (pain).     . Polyvinyl Alcohol-Povidone (REFRESH OP) Place 1 drop into both eyes 4 (four) times daily as needed (dry eyes).    . potassium chloride (KLOR-CON) 10 MEQ tablet Only take Mon, Wed, and Friday with lasix. Please make overdue appt with Dr. Burt Knack before anymore refills. 1st attempt 15 tablet 0  . sacubitril-valsartan (ENTRESTO) 97-103 MG Take 1 tablet by mouth 2 (two) times daily.     Current Facility-Administered Medications  Medication Dose Route Frequency Provider Last Rate Last Admin  . betamethasone acetate-betamethasone sodium phosphate (CELESTONE) injection 3 mg  3 mg Intramuscular Once Edrick Kins, DPM        Allergies:   Ciprofloxacin, Codeine, Hydrocodone,  Penicillins, Shellfish allergy, Diltiazem hcl, Sulfonamide derivatives, Morphine and related, Lovastatin, and Metformin   Social History: Social History   Socioeconomic History  . Marital status: Widowed    Spouse name: Not on file  . Number of children: Not on file  . Years of education: Not on file  . Highest education level: Not on file  Occupational History  . Occupation: RETIRED LPN  Tobacco Use  . Smoking status: Former Research scientist (life sciences)  . Smokeless tobacco: Never Used  . Tobacco comment: quit 30 yrs ago  Vaping Use  . Vaping Use: Never used  Substance and Sexual  Activity  . Alcohol use: No  . Drug use: No  . Sexual activity: Not on file  Other Topics Concern  . Not on file  Social History Narrative   Widowed 2004.., Lives alone..Family history is negative for premature coronary artery disease. Mother died at age 35 with heart disease in her later years, father died at age 27 from a stroke.Marland KitchenShe  has 8 siblings, none of whom have coronary artery disease.         Social Determinants of Health   Financial Resource Strain:   . Difficulty of Paying Living Expenses: Not on file  Food Insecurity:   . Worried About Charity fundraiser in the Last Year: Not on file  . Ran Out of Food in the Last Year: Not on file  Transportation Needs: No Transportation Needs  . Lack of Transportation (Medical): No  . Lack of Transportation (Non-Medical): No  Physical Activity: Inactive  . Days of Exercise per Week: 0 days  . Minutes of Exercise per Session: 0 min  Stress:   . Feeling of Stress : Not on file  Social Connections:   . Frequency of Communication with Friends and Family: Not on file  . Frequency of Social Gatherings with Friends and Family: Not on file  . Attends Religious Services: Not on file  . Active Member of Clubs or Organizations: Not on file  . Attends Archivist Meetings: Not on file  . Marital Status: Not on file  Intimate Partner Violence:   . Fear of Current  or Ex-Partner: Not on file  . Emotionally Abused: Not on file  . Physically Abused: Not on file  . Sexually Abused: Not on file    Family History: Family History  Problem Relation Age of Onset  . Diabetes Mother   . Hypertension Mother   . Heart attack Mother   . Stroke Father   . Coronary artery disease Other      Review of Systems: All other systems reviewed and are otherwise negative except as noted above.   Physical Exam: VS:  BP (!) 166/62   Pulse 60   Ht _0  (1.702 m)   Wt 198 lb 12.8 oz (90.2 kg)   SpO2 93%   BMI 31.14 kg/m  , BMI Body mass index is 31.14 kg/m.  GEN- The patient is elderly appearing, alert and oriented x 3 today.   HEENT: normocephalic, atraumatic; sclera clear, conjunctiva pink; hearing intact; oropharynx clear; neck supple  Lungs- Clear to ausculation bilaterally, normal work of breathing.  No wheezes, rales, rhonchi Heart- Regular rate and rhythm  GI- soft, non-tender, non-distended, bowel sounds present  Extremities- no clubbing, cyanosis, or edema  MS- no significant deformity or atrophy Skin- warm and dry, no rash or lesion; PPM pocket well healed Psych- euthymic mood, full affect Neuro- strength and sensation are intact  PPM Interrogation- reviewed in detail today,  See PACEART report  EKG:  EKG is not ordered today.  Recent Labs: 11/05/2019: BUN 29; Creatinine, Ser 1.33; Hemoglobin 11.0; Platelets 199; Potassium 3.4; Sodium 138   Wt Readings from Last 3 Encounters:  02/27/20 198 lb 12.8 oz (90.2 kg)  11/14/18 212 lb (96.2 kg)  08/08/18 218 lb (98.9 kg)     Other studies Reviewed: Additional studies/ records that were reviewed today include: Trinidad Curet office notes    Assessment and Plan:  1.  Sick sinus syndrome Normal PPM function See Pace Art report No changes today  2.  HTN Stable No change required today  3.  CAD s/p CABG Stable No change required today  4.  Chronic diastolic heart failure Stable No  change required today    Current medicines are reviewed at length with the patient today.   The patient does not have concerns regarding her medicines.  The following changes were made today:  none  Labs/ tests ordered today include: none Orders Placed This Encounter  Procedures  . CUP PACEART INCLINIC DEVICE CHECK     Disposition:   Follow up with [facility], Dr Rayann Heman 1 year     Signed, Chanetta Marshall, NP 02/27/2020 11:52 AM  [place] [address] [place]  [place] [address] [phone] (office) [phone] (fax)

## 2020-02-27 NOTE — Patient Instructions (Signed)
Medication Instructions:  *If you need a refill on your cardiac medications before your next appointment, please call your pharmacy*  Follow-Up: At [facility], you and your health needs are our priority.  As part of our continuing mission to provide you with exceptional heart care, we have created designated Provider Care Teams.  These Care Teams include your primary Cardiologist (physician) and Advanced Practice Providers (APPs -  Physician Assistants and Nurse Practitioners) who all work together to provide you with the care you need, when you need it.  We recommend signing up for the patient portal called "MyChart".  Sign up information is provided on this After Visit Summary.  MyChart is used to connect with patients for Virtual Visits (Telemedicine).  Patients are able to view lab/test results, encounter notes, upcoming appointments, etc.  Non-urgent messages can be sent to your provider as well.   To learn more about what you can do with MyChart, go to [email or web address].    Your next appointment:   Your physician wants you to follow-up in: 1 YEAR with Dr. Johney Frame. You will receive a reminder letter in the mail two months in advance. If you don't receive a letter, please call our office to schedule the follow-up appointment.  Remote monitoring is used to monitor your Pacemaker from home. This monitoring reduces the number of office visits required to check your device to one time per year. It allows [place] to keep an eye on the functioning of your device to ensure it is working properly. You are scheduled for a device check from home on 02/27/20. You may send your transmission at any time that day. If you have a wireless device, the transmission will be sent automatically. After your physician reviews your transmission, you will receive a postcard with your next transmission date.  The format for your next appointment:   In Person with Hillis Range, MD

## 2020-03-03 ENCOUNTER — Telehealth: Payer: Self-pay | Admitting: Cardiovascular Disease

## 2020-03-03 NOTE — Telephone Encounter (Signed)
Spoke with [name] from Dr. Tedra Sen[place] office. She states the patient was told by Dr. Earmon [place] office to take half of a 10 mg tablet of atorvastatin. Reiterated to her that she has not seen cardiology (other than EP) in over a year and she no-showed to her last visit September 2020. Reiterated to her that there is no documentation of Cardiology decreasing her atorvastatin dose. [name] is concerned the patient is taking the wrong medications.  Reiterated to [name] that the patient saw Amber last week and confirmed different medication doses as she told [place]. Since there is a concern for medication adherence, informed [name] the patient will be called to arrange an appointment to confirm medications.

## 2020-03-03 NOTE — Telephone Encounter (Signed)
Pt c/o medication issue:  1. Name of Medication: atorvastatin (LIPITOR) 20 MG tablet  2. How are you currently taking this medication (dosage and times per day)? Not taking   3. Are you having a reaction (difficulty breathing--STAT)? no  4. What is your medication issue? Jamie from Dr. Tedra [place] office states the patient has not been taking the medication. She states the patient was told to cut the tablet in half and take 10 mg's, but the pills are too small to cut. She states she is not sure if the patient is supposed to be taking half a tablet or a full one. She would like to verify this. If the patient needs to take 10 mg's, she would like a 10 mg tablet prescription sent to the pharmacy instead.

## 2020-03-09 ENCOUNTER — Other Ambulatory Visit: Payer: Self-pay | Admitting: Cardiovascular Disease

## 2020-03-09 DIAGNOSIS — I5032 Chronic diastolic (congestive) heart failure: Secondary | ICD-10-CM

## 2020-03-09 NOTE — Telephone Encounter (Signed)
Received automated message that voice mailbox is not accepting messages.  Will try again later.

## 2020-03-10 DIAGNOSIS — E1122 Type 2 diabetes mellitus with diabetic chronic kidney disease: Secondary | ICD-10-CM | POA: Diagnosis not present

## 2020-03-10 DIAGNOSIS — I5032 Chronic diastolic (congestive) heart failure: Secondary | ICD-10-CM | POA: Diagnosis not present

## 2020-03-10 DIAGNOSIS — N1831 Chronic kidney disease, stage 3a: Secondary | ICD-10-CM | POA: Diagnosis not present

## 2020-03-10 DIAGNOSIS — I130 Hypertensive heart and chronic kidney disease with heart failure and stage 1 through stage 4 chronic kidney disease, or unspecified chronic kidney disease: Secondary | ICD-10-CM | POA: Diagnosis not present

## 2020-03-17 ENCOUNTER — Other Ambulatory Visit: Payer: Self-pay | Admitting: Cardiovascular Disease

## 2020-03-17 DIAGNOSIS — E785 Hyperlipidemia, unspecified: Secondary | ICD-10-CM

## 2020-03-17 NOTE — Telephone Encounter (Signed)
Reached the patient and reviewed her cardiac medications in detail. She confirmed medication list and stated she is taking everything as directed. Offered her an appointment but she declines at this time.  Scheduled her for 3 month appointment with Tereso Newcomer in February. She was grateful for call and agrees with plan.

## 2020-03-19 DIAGNOSIS — E1169 Type 2 diabetes mellitus with other specified complication: Secondary | ICD-10-CM | POA: Diagnosis not present

## 2020-03-19 DIAGNOSIS — I130 Hypertensive heart and chronic kidney disease with heart failure and stage 1 through stage 4 chronic kidney disease, or unspecified chronic kidney disease: Secondary | ICD-10-CM | POA: Diagnosis not present

## 2020-03-19 DIAGNOSIS — D039 Melanoma in situ, unspecified: Secondary | ICD-10-CM | POA: Diagnosis not present

## 2020-03-19 DIAGNOSIS — I309 Acute pericarditis, unspecified: Secondary | ICD-10-CM | POA: Diagnosis not present

## 2020-03-19 DIAGNOSIS — E039 Hypothyroidism, unspecified: Secondary | ICD-10-CM | POA: Diagnosis not present

## 2020-04-06 DIAGNOSIS — H43813 Vitreous degeneration, bilateral: Secondary | ICD-10-CM | POA: Diagnosis not present

## 2020-04-06 DIAGNOSIS — H353221 Exudative age-related macular degeneration, left eye, with active choroidal neovascularization: Secondary | ICD-10-CM | POA: Diagnosis not present

## 2020-04-06 DIAGNOSIS — E113293 Type 2 diabetes mellitus with mild nonproliferative diabetic retinopathy without macular edema, bilateral: Secondary | ICD-10-CM | POA: Diagnosis not present

## 2020-04-06 DIAGNOSIS — H35371 Puckering of macula, right eye: Secondary | ICD-10-CM | POA: Diagnosis not present

## 2020-04-06 DIAGNOSIS — H353111 Nonexudative age-related macular degeneration, right eye, early dry stage: Secondary | ICD-10-CM | POA: Diagnosis not present

## 2020-04-10 ENCOUNTER — Other Ambulatory Visit: Payer: Self-pay | Admitting: Cardiovascular Disease

## 2020-04-10 DIAGNOSIS — I5032 Chronic diastolic (congestive) heart failure: Secondary | ICD-10-CM | POA: Diagnosis not present

## 2020-04-10 DIAGNOSIS — E1122 Type 2 diabetes mellitus with diabetic chronic kidney disease: Secondary | ICD-10-CM | POA: Diagnosis not present

## 2020-04-10 DIAGNOSIS — I130 Hypertensive heart and chronic kidney disease with heart failure and stage 1 through stage 4 chronic kidney disease, or unspecified chronic kidney disease: Secondary | ICD-10-CM | POA: Diagnosis not present

## 2020-04-10 DIAGNOSIS — N1831 Chronic kidney disease, stage 3a: Secondary | ICD-10-CM | POA: Diagnosis not present

## 2020-04-20 ENCOUNTER — Other Ambulatory Visit: Payer: Self-pay | Admitting: Physician Assistant

## 2020-04-28 ENCOUNTER — Other Ambulatory Visit: Payer: Self-pay | Admitting: *Deleted

## 2020-04-28 NOTE — Patient Outreach (Signed)
[facility]) Care Management  [facility] Care Manager  04/28/2020   [name] July 24, 1932 [id number]  Subjective: Successful telephone outreach call to patient. HIPAA identifiers obtained. Patient reports she is doing fairly well. She has an appointment to see her PCP on 04/30/20 and made a list of questions that she would like to discuss. Patient states that she has not taken her blood sugar today,  but reports fasting ranges of 80 to 130's recently.  Latest Hgb A1C decreased to 6.7. Denies any hypoglycemic episodes and states she rarely has to take insulin due to controlled blood sugars. Denies any falls or sick days. Patient Reported that she has lost approximately 50 pounds by making healthier food choices and decreasing her portions. She explains that she does walk routinely under the corridor of her apartment complex. Patient did not have any further questions or concerns today.     Encounter Medications:  Outpatient Encounter Medications as of 04/28/2020  Medication Sig Note  . acetaminophen (TYLENOL) 500 MG tablet Take 500 mg by mouth every 6 (six) hours as needed for headache (pain).   Marland Kitchen allopurinol (ZYLOPRIM) 100 MG tablet Take 100 mg by mouth at bedtime.    Marland Kitchen aspirin EC 81 MG tablet Take 81 mg by mouth at bedtime.    Marland Kitchen atorvastatin (LIPITOR) 20 MG tablet TAKE 1 TABLET BY MOUTH  DAILY   . azelastine (OPTIVAR) 0.05 % ophthalmic solution Place 1 drop into both eyes 2 (two) times daily.   . carvedilol (COREG) 6.25 MG tablet TAKE 1 TABLET BY MOUTH  TWICE DAILY   . clopidogrel (PLAVIX) 75 MG tablet Take 1 tablet (75 mg total) by mouth daily. Please make overdue appt with Dr. Excell Seltzer before anymore refills. Thank you 1st attempt   . dapagliflozin propanediol (FARXIGA) 5 MG TABS tablet Take 5 mg by mouth daily.   . furosemide (LASIX) 40 MG tablet TAKE 1 TABLET BY MOUTH  MONDAY WEDNESDAY AND FRIDAY WITH POTASSIUM   . insulin NPH Human (HUMULIN N,NOVOLIN N) 100 UNIT/ML injection  Inject 5-45 Units into the skin See admin instructions. Inject 45 units subcutaneously every morning and 10 units at night 04/28/2020: Patient takes only when her blood sugar is up  . Multiple Vitamin (MULTIVITAMIN WITH MINERALS) TABS tablet Take 1 tablet by mouth at bedtime.    . nitroGLYCERIN (NITROSTAT) 0.4 MG SL tablet DISSOLVE ONE TABLET UNDER THE TONGUE EVERY 5 MINUTES AS NEEDED FOR CHEST PAIN.  DO NOT EXCEED A TOTAL OF 3 DOSES IN 15 MINUTES   . ONE TOUCH ULTRA TEST test strip 1 each by Other route daily as needed (blood sugar).    Marland Kitchen oxyCODONE-acetaminophen (PERCOCET/ROXICET) 5-325 MG tablet Take 1 tablet by mouth daily as needed (pain).    . Polyvinyl Alcohol-Povidone (REFRESH OP) Place 1 drop into both eyes 4 (four) times daily as needed (dry eyes).   . potassium chloride (KLOR-CON) 10 MEQ tablet Only take Mon, Wed, and Friday with lasix. Please make overdue appt with Dr. Excell Seltzer before anymore refills. 1st attempt   . potassium chloride (KLOR-CON) 10 MEQ tablet TAKE 1 TABLET BY MOUTH  MONDAY WEDNESDAY AND FRIDAY WITH LASIX   . sacubitril-valsartan (ENTRESTO) 97-103 MG Take 1 tablet by mouth 2 (two) times daily.   Marland Kitchen amLODipine (NORVASC) 5 MG tablet Take 1 tablet (5 mg total) by mouth daily. (Patient taking differently: Take 10 mg by mouth daily. )    Facility-Administered Encounter Medications as of 04/28/2020  Medication  . betamethasone  acetate-betamethasone sodium phosphate (CELESTONE) injection 3 mg    Functional Status:  No flowsheet data found.  Fall/Depression Screening: Fall Risk  04/28/2020 02/24/2020 08/20/2019  Falls in the past year? 1 1 1   Comment - last fall Jnuary 2021 Last fall January 2021  Number falls in past yr: 0 1 1  Injury with Fall? 0 0 0  Risk for fall due to : History of fall(s);Impaired mobility;Impaired balance/gait;Impaired vision History of fall(s);Impaired balance/gait;Impaired mobility;Impaired vision;Medication side effect History of fall(s);Impaired  balance/gait;Impaired mobility;Impaired vision;Medication side effect  Follow up Falls prevention discussed;Education provided;Falls evaluation completed Falls evaluation completed;Education provided;Falls prevention discussed Falls evaluation completed;Education provided;Falls prevention discussed   PHQ 2/9 Scores 02/24/2020 11/01/2018 10/08/2018  PHQ - 2 Score 0 2 2  PHQ- 9 Score - 2 2    Assessment:  Goals Addressed            This Visit's Progress   . [facility]) Learn More About My Health       Timeframe:  Long-Range Goal Priority:  High Start Date:  02/24/20                           Expected End Date:  08/08/20                     Follow Up Date 08/08/20   - tell my story and reason for my visit - make a list of questions - ask questions - repeat what I heard to make sure I understand - bring a list of my medicines to the visit - speak up when I don't understand     Why is this important?   The best way to learn about your health and care is by talking to the doctor and nurse.  They will answer your questions and give you information in the way that you like best.    Notes: Patient reports that she does ask questions during  provider visits. She has prepared a list of concerns for her PCP to discuss with her during her upcoming visit. Nurse encouraged the patient to continue to do this for all of her provider appointments.     . COMPLETED: [facility]) Make and Keep All Appointments       Timeframe:  Long-Range Goal Priority:  Medium Start Date: 02/24/20                            Expected End Date: 04/28/20                     Follow Up Date 04/28/20   - ask family or friend for a ride - call to cancel if needed - keep a calendar with appointment dates -if elbow pain not relieved with colchicine, contact provider for appointment    Why is this important?   Part of staying healthy is seeing the doctor for follow-up care.  If you forget your appointments, there are some things  you can do to stay on track.    Notes: Patient states she does not have any difficulty scheduling or keeping her provider appointments. She has an appointment to see PCP on 04/30/20    . [facility]) Monitor and Manage My Blood Sugar       Timeframe:  Long-Range Goal Priority:  High Start Date:  02/24/20  Expected End Date: 10/08/20                     Follow Up Date 08/08/20   - check blood sugar at prescribed times - check blood sugar if I feel it is too high or too low - enter blood sugar readings and medication or insulin into daily log - take the blood sugar log to all doctor visits - take the blood sugar meter to all doctor visits    Why is this important?   Checking your blood sugar at home helps to keep it from getting very high or very low.  Writing the results in a diary or log helps the doctor know how to care for you.  Your blood sugar log should have the time, date and the results.  Also, write down the amount of insulin or other medicine that you take.  Other information, like what you ate, exercise done and how you were feeling, will also be helpful.     Notes: Patient reports checking her blood sugar 2-3 times daily and recording the values.    . COMPLETED: [facility]) Set My Target A1C       Timeframe:  Long-Range Goal Priority:  Low Start Date:  02/24/20                           Expected End Date: 04/28/20                      Follow Up Date 04/28/20   - set target A1C; goal 7; current 6.7    Why is this important?   Your target A1C is decided together by you and your doctor.  It is based on several things like your age and other health issues.    Notes:     . [facility]) Track and Manage Fluids and Swelling       Timeframe:  Long-Range Goal Priority:  Medium Start Date: 02/24/20                            Expected End Date: 10/08/20                      Follow Up Date 08/08/20   - call office if I gain more than 2 pounds in one day or 5 pounds in  one week - keep legs up while sitting - track weight in diary - use salt in moderation - watch for swelling in feet, ankles and legs every day - weigh myself daily -discuss with Cardiologist need for daily weight monitoring    Why is this important?   It is important to check your weight daily and watch how much salt and liquids you have.  It will help you to manage your heart failure.    Notes:       Plan: RN Health Coach will send PCP today's assessment note and will call patient within the month of April. Follow-up:  Patient agrees to Care Plan and Follow-up.  Samantha Serve RN, BSN [facility] Care Management  RN Health Coach [phone] [name]

## 2020-04-29 NOTE — Patient Instructions (Signed)
Goals Addressed            This Visit's Progress    [facility]) Learn More About My Health       Timeframe:  Long-Range Goal Priority:  High Start Date:  02/24/20                           Expected End Date:  08/08/20                     Follow Up Date 08/08/20   - tell my story and reason for my visit - make a list of questions - ask questions - repeat what I heard to make sure I understand - bring a list of my medicines to the visit - speak up when I don't understand     Why is this important?   The best way to learn about your health and care is by talking to the doctor and nurse.  They will answer your questions and give you information in the way that you like best.    Notes: Patient reports that she does ask questions during  provider visits. She has prepared a list of concerns for her PCP to discuss with her during her upcoming visit. Nurse encouraged the patient to continue to do this for all of her provider appointments.      COMPLETED: [facility]) Make and Keep All Appointments       Timeframe:  Long-Range Goal Priority:  Medium Start Date: 02/24/20                            Expected End Date: 04/28/20                     Follow Up Date 04/28/20   - ask family or friend for a ride - call to cancel if needed - keep a calendar with appointment dates -if elbow pain not relieved with colchicine, contact provider for appointment    Why is this important?   Part of staying healthy is seeing the doctor for follow-up care.  If you forget your appointments, there are some things you can do to stay on track.    Notes: Patient states she does not have any difficulty scheduling or keeping her provider appointments. She has an appointment to see PCP on 04/30/20     [facility]) Monitor and Manage My Blood Sugar       Timeframe:  Long-Range Goal Priority:  High Start Date:  02/24/20                           Expected End Date: 10/08/20                     Follow Up Date 08/08/20   -  check blood sugar at prescribed times - check blood sugar if I feel it is too high or too low - enter blood sugar readings and medication or insulin into daily log - take the blood sugar log to all doctor visits - take the blood sugar meter to all doctor visits    Why is this important?   Checking your blood sugar at home helps to keep it from getting very high or very low.  Writing the results in a diary or log helps the doctor know how to care for you.  Your blood sugar log should have the time, date and the results.  Also, write down the amount of insulin or other medicine that you take.  Other information, like what you ate, exercise done and how you were feeling, will also be helpful.     Notes: Patient reports checking her blood sugar 2-3 times daily and recording the values.     COMPLETED: [facility]) Set My Target A1C       Timeframe:  Long-Range Goal Priority:  Low Start Date:  02/24/20                           Expected End Date: 04/28/20                      Follow Up Date 04/28/20   - set target A1C; goal 7; current 6.7    Why is this important?   Your target A1C is decided together by you and your doctor.  It is based on several things like your age and other health issues.    Notes:      [facility]) Track and Manage Fluids and Swelling       Timeframe:  Long-Range Goal Priority:  Medium Start Date: 02/24/20                            Expected End Date: 10/08/20                      Follow Up Date 08/08/20   - call office if I gain more than 2 pounds in one day or 5 pounds in one week - keep legs up while sitting - track weight in diary - use salt in moderation - watch for swelling in feet, ankles and legs every day - weigh myself daily -discuss with Cardiologist need for daily weight monitoring    Why is this important?   It is important to check your weight daily and watch how much salt and liquids you have.  It will help you to manage your heart failure.    Notes:

## 2020-04-30 DIAGNOSIS — Z20822 Contact with and (suspected) exposure to covid-19: Secondary | ICD-10-CM | POA: Diagnosis not present

## 2020-04-30 DIAGNOSIS — R5381 Other malaise: Secondary | ICD-10-CM | POA: Diagnosis not present

## 2020-05-05 DIAGNOSIS — E113293 Type 2 diabetes mellitus with mild nonproliferative diabetic retinopathy without macular edema, bilateral: Secondary | ICD-10-CM | POA: Diagnosis not present

## 2020-05-05 DIAGNOSIS — H353111 Nonexudative age-related macular degeneration, right eye, early dry stage: Secondary | ICD-10-CM | POA: Diagnosis not present

## 2020-05-05 DIAGNOSIS — H348312 Tributary (branch) retinal vein occlusion, right eye, stable: Secondary | ICD-10-CM | POA: Diagnosis not present

## 2020-05-05 DIAGNOSIS — H353221 Exudative age-related macular degeneration, left eye, with active choroidal neovascularization: Secondary | ICD-10-CM | POA: Diagnosis not present

## 2020-05-11 DIAGNOSIS — I130 Hypertensive heart and chronic kidney disease with heart failure and stage 1 through stage 4 chronic kidney disease, or unspecified chronic kidney disease: Secondary | ICD-10-CM | POA: Diagnosis not present

## 2020-05-11 DIAGNOSIS — N1831 Chronic kidney disease, stage 3a: Secondary | ICD-10-CM | POA: Diagnosis not present

## 2020-05-11 DIAGNOSIS — I5032 Chronic diastolic (congestive) heart failure: Secondary | ICD-10-CM | POA: Diagnosis not present

## 2020-05-11 DIAGNOSIS — E1122 Type 2 diabetes mellitus with diabetic chronic kidney disease: Secondary | ICD-10-CM | POA: Diagnosis not present

## 2020-05-22 ENCOUNTER — Ambulatory Visit: Payer: Medicare Other | Admitting: Physician Assistant

## 2020-05-25 ENCOUNTER — Other Ambulatory Visit: Payer: Self-pay | Admitting: Cardiovascular Disease

## 2020-05-25 DIAGNOSIS — I5032 Chronic diastolic (congestive) heart failure: Secondary | ICD-10-CM

## 2020-06-03 DIAGNOSIS — H348312 Tributary (branch) retinal vein occlusion, right eye, stable: Secondary | ICD-10-CM | POA: Diagnosis not present

## 2020-06-03 DIAGNOSIS — E113293 Type 2 diabetes mellitus with mild nonproliferative diabetic retinopathy without macular edema, bilateral: Secondary | ICD-10-CM | POA: Diagnosis not present

## 2020-06-03 DIAGNOSIS — H353111 Nonexudative age-related macular degeneration, right eye, early dry stage: Secondary | ICD-10-CM | POA: Diagnosis not present

## 2020-06-03 DIAGNOSIS — H353221 Exudative age-related macular degeneration, left eye, with active choroidal neovascularization: Secondary | ICD-10-CM | POA: Diagnosis not present

## 2020-06-05 DIAGNOSIS — E1169 Type 2 diabetes mellitus with other specified complication: Secondary | ICD-10-CM | POA: Diagnosis not present

## 2020-06-05 DIAGNOSIS — I130 Hypertensive heart and chronic kidney disease with heart failure and stage 1 through stage 4 chronic kidney disease, or unspecified chronic kidney disease: Secondary | ICD-10-CM | POA: Diagnosis not present

## 2020-06-05 DIAGNOSIS — R5381 Other malaise: Secondary | ICD-10-CM | POA: Diagnosis not present

## 2020-06-05 DIAGNOSIS — I1310 Hypertensive heart and chronic kidney disease without heart failure, with stage 1 through stage 4 chronic kidney disease, or unspecified chronic kidney disease: Secondary | ICD-10-CM | POA: Diagnosis not present

## 2020-06-05 DIAGNOSIS — E1139 Type 2 diabetes mellitus with other diabetic ophthalmic complication: Secondary | ICD-10-CM | POA: Diagnosis not present

## 2020-06-08 DIAGNOSIS — I5032 Chronic diastolic (congestive) heart failure: Secondary | ICD-10-CM | POA: Diagnosis not present

## 2020-06-08 DIAGNOSIS — N1831 Chronic kidney disease, stage 3a: Secondary | ICD-10-CM | POA: Diagnosis not present

## 2020-06-08 DIAGNOSIS — I130 Hypertensive heart and chronic kidney disease with heart failure and stage 1 through stage 4 chronic kidney disease, or unspecified chronic kidney disease: Secondary | ICD-10-CM | POA: Diagnosis not present

## 2020-06-08 DIAGNOSIS — E1122 Type 2 diabetes mellitus with diabetic chronic kidney disease: Secondary | ICD-10-CM | POA: Diagnosis not present

## 2020-06-19 DIAGNOSIS — M6281 Muscle weakness (generalized): Secondary | ICD-10-CM | POA: Diagnosis not present

## 2020-06-19 DIAGNOSIS — E119 Type 2 diabetes mellitus without complications: Secondary | ICD-10-CM | POA: Diagnosis not present

## 2020-06-19 DIAGNOSIS — Z7984 Long term (current) use of oral hypoglycemic drugs: Secondary | ICD-10-CM | POA: Diagnosis not present

## 2020-06-19 DIAGNOSIS — I509 Heart failure, unspecified: Secondary | ICD-10-CM | POA: Diagnosis not present

## 2020-06-19 DIAGNOSIS — R2689 Other abnormalities of gait and mobility: Secondary | ICD-10-CM | POA: Diagnosis not present

## 2020-06-23 DIAGNOSIS — I509 Heart failure, unspecified: Secondary | ICD-10-CM | POA: Diagnosis not present

## 2020-06-23 DIAGNOSIS — Z7984 Long term (current) use of oral hypoglycemic drugs: Secondary | ICD-10-CM | POA: Diagnosis not present

## 2020-06-23 DIAGNOSIS — E119 Type 2 diabetes mellitus without complications: Secondary | ICD-10-CM | POA: Diagnosis not present

## 2020-06-23 DIAGNOSIS — R2689 Other abnormalities of gait and mobility: Secondary | ICD-10-CM | POA: Diagnosis not present

## 2020-06-23 DIAGNOSIS — M6281 Muscle weakness (generalized): Secondary | ICD-10-CM | POA: Diagnosis not present

## 2020-06-25 DIAGNOSIS — E119 Type 2 diabetes mellitus without complications: Secondary | ICD-10-CM | POA: Diagnosis not present

## 2020-06-25 DIAGNOSIS — Z7984 Long term (current) use of oral hypoglycemic drugs: Secondary | ICD-10-CM | POA: Diagnosis not present

## 2020-06-25 DIAGNOSIS — R2689 Other abnormalities of gait and mobility: Secondary | ICD-10-CM | POA: Diagnosis not present

## 2020-06-25 DIAGNOSIS — M6281 Muscle weakness (generalized): Secondary | ICD-10-CM | POA: Diagnosis not present

## 2020-06-25 DIAGNOSIS — I509 Heart failure, unspecified: Secondary | ICD-10-CM | POA: Diagnosis not present

## 2020-06-30 DIAGNOSIS — M6281 Muscle weakness (generalized): Secondary | ICD-10-CM | POA: Diagnosis not present

## 2020-06-30 DIAGNOSIS — R2689 Other abnormalities of gait and mobility: Secondary | ICD-10-CM | POA: Diagnosis not present

## 2020-06-30 DIAGNOSIS — Z7984 Long term (current) use of oral hypoglycemic drugs: Secondary | ICD-10-CM | POA: Diagnosis not present

## 2020-06-30 DIAGNOSIS — I509 Heart failure, unspecified: Secondary | ICD-10-CM | POA: Diagnosis not present

## 2020-06-30 DIAGNOSIS — E119 Type 2 diabetes mellitus without complications: Secondary | ICD-10-CM | POA: Diagnosis not present

## 2020-07-02 DIAGNOSIS — Z7984 Long term (current) use of oral hypoglycemic drugs: Secondary | ICD-10-CM | POA: Diagnosis not present

## 2020-07-02 DIAGNOSIS — R2689 Other abnormalities of gait and mobility: Secondary | ICD-10-CM | POA: Diagnosis not present

## 2020-07-02 DIAGNOSIS — M6281 Muscle weakness (generalized): Secondary | ICD-10-CM | POA: Diagnosis not present

## 2020-07-02 DIAGNOSIS — I509 Heart failure, unspecified: Secondary | ICD-10-CM | POA: Diagnosis not present

## 2020-07-02 DIAGNOSIS — E119 Type 2 diabetes mellitus without complications: Secondary | ICD-10-CM | POA: Diagnosis not present

## 2020-07-06 DIAGNOSIS — H353221 Exudative age-related macular degeneration, left eye, with active choroidal neovascularization: Secondary | ICD-10-CM | POA: Diagnosis not present

## 2020-07-07 DIAGNOSIS — M6281 Muscle weakness (generalized): Secondary | ICD-10-CM | POA: Diagnosis not present

## 2020-07-07 DIAGNOSIS — Z7984 Long term (current) use of oral hypoglycemic drugs: Secondary | ICD-10-CM | POA: Diagnosis not present

## 2020-07-07 DIAGNOSIS — I509 Heart failure, unspecified: Secondary | ICD-10-CM | POA: Diagnosis not present

## 2020-07-07 DIAGNOSIS — E119 Type 2 diabetes mellitus without complications: Secondary | ICD-10-CM | POA: Diagnosis not present

## 2020-07-07 DIAGNOSIS — R2689 Other abnormalities of gait and mobility: Secondary | ICD-10-CM | POA: Diagnosis not present

## 2020-07-09 DIAGNOSIS — E119 Type 2 diabetes mellitus without complications: Secondary | ICD-10-CM | POA: Diagnosis not present

## 2020-07-09 DIAGNOSIS — I509 Heart failure, unspecified: Secondary | ICD-10-CM | POA: Diagnosis not present

## 2020-07-09 DIAGNOSIS — R2689 Other abnormalities of gait and mobility: Secondary | ICD-10-CM | POA: Diagnosis not present

## 2020-07-09 DIAGNOSIS — M6281 Muscle weakness (generalized): Secondary | ICD-10-CM | POA: Diagnosis not present

## 2020-07-09 DIAGNOSIS — Z7984 Long term (current) use of oral hypoglycemic drugs: Secondary | ICD-10-CM | POA: Diagnosis not present

## 2020-07-15 DIAGNOSIS — R2689 Other abnormalities of gait and mobility: Secondary | ICD-10-CM | POA: Diagnosis not present

## 2020-07-15 DIAGNOSIS — E119 Type 2 diabetes mellitus without complications: Secondary | ICD-10-CM | POA: Diagnosis not present

## 2020-07-15 DIAGNOSIS — M6281 Muscle weakness (generalized): Secondary | ICD-10-CM | POA: Diagnosis not present

## 2020-07-15 DIAGNOSIS — I509 Heart failure, unspecified: Secondary | ICD-10-CM | POA: Diagnosis not present

## 2020-07-15 DIAGNOSIS — Z7984 Long term (current) use of oral hypoglycemic drugs: Secondary | ICD-10-CM | POA: Diagnosis not present

## 2020-07-17 DIAGNOSIS — N189 Chronic kidney disease, unspecified: Secondary | ICD-10-CM | POA: Diagnosis not present

## 2020-07-17 DIAGNOSIS — H6122 Impacted cerumen, left ear: Secondary | ICD-10-CM | POA: Diagnosis not present

## 2020-07-17 DIAGNOSIS — R609 Edema, unspecified: Secondary | ICD-10-CM | POA: Diagnosis not present

## 2020-07-17 DIAGNOSIS — E039 Hypothyroidism, unspecified: Secondary | ICD-10-CM | POA: Diagnosis not present

## 2020-07-17 DIAGNOSIS — I5032 Chronic diastolic (congestive) heart failure: Secondary | ICD-10-CM | POA: Diagnosis not present

## 2020-07-17 DIAGNOSIS — I1310 Hypertensive heart and chronic kidney disease without heart failure, with stage 1 through stage 4 chronic kidney disease, or unspecified chronic kidney disease: Secondary | ICD-10-CM | POA: Diagnosis not present

## 2020-07-20 DIAGNOSIS — Z7984 Long term (current) use of oral hypoglycemic drugs: Secondary | ICD-10-CM | POA: Diagnosis not present

## 2020-07-20 DIAGNOSIS — I1310 Hypertensive heart and chronic kidney disease without heart failure, with stage 1 through stage 4 chronic kidney disease, or unspecified chronic kidney disease: Secondary | ICD-10-CM | POA: Diagnosis not present

## 2020-07-20 DIAGNOSIS — R2689 Other abnormalities of gait and mobility: Secondary | ICD-10-CM | POA: Diagnosis not present

## 2020-07-20 DIAGNOSIS — E1169 Type 2 diabetes mellitus with other specified complication: Secondary | ICD-10-CM | POA: Diagnosis not present

## 2020-07-20 DIAGNOSIS — I509 Heart failure, unspecified: Secondary | ICD-10-CM | POA: Diagnosis not present

## 2020-07-20 DIAGNOSIS — I110 Hypertensive heart disease with heart failure: Secondary | ICD-10-CM | POA: Diagnosis not present

## 2020-07-20 DIAGNOSIS — M6281 Muscle weakness (generalized): Secondary | ICD-10-CM | POA: Diagnosis not present

## 2020-07-20 DIAGNOSIS — E119 Type 2 diabetes mellitus without complications: Secondary | ICD-10-CM | POA: Diagnosis not present

## 2020-07-20 DIAGNOSIS — N1832 Chronic kidney disease, stage 3b: Secondary | ICD-10-CM | POA: Diagnosis not present

## 2020-07-20 NOTE — Progress Notes (Signed)
Cardiology Office Note:    Date:  07/21/2020   ID:  [name], DOB [birth year], MRN [id number]  PCP:  Lucianne Lei, [place]  Cardiologist:  Sherren Mocha, MD   Advanced Practice Provider:  Liliane Shi, PA-C Electrophysiologist:  Thompson Grayer, MD       Referring MD: Lucianne Lei, MD   Chief Complaint:  Follow-up (CAD, CHF)    Patient Profile:     [name] is a 87 y.o. female with:   CAD ? S/p CABG in 2011 ? Cardiac catheterization 8/15:  Patent Grafts  SSS s/p PPM  (HFpEF) heart failure with preserved ejection fraction   Hypertension  Hyperlipidemia   Diabetes mellitus   Chronic kidney disease   PAD  Prior CV studies: Carotid US 01/29/2018 Bilateral ICA 1-39  Myoview 11/24/2015 EF 60, no ischemia, no infarct; low risk  Echocardiogram 07/13/2015 EF 55-60, normal wall motion, trivial AI, MAC, moderate LAE, PASP 35  LHC 12/02/13 KG:URKYHC. LAD:95% proximal in-stent restenosis. Diffuse disease throughout the remainder of the vessel that in the left ventricular apex. Reflux into the mammary is noted to reflux into the diagonal graft is also noted. WCB:JSEGBTD occluded.40% stenosis proximal to the first obtuse marginal; the distal circumflex is totally occluded. VVO:HYWVPXT occluded distally. SVG to OM 1 and OM 2 is widely patent. SVG to diagonal #1 is widely patent. SVG to PDA is widely patent. LIMA to LAD is widely patent EF:60%.     History of Present Illness:    [name] was last seen in 11/2018.  She returns for f/u.  She is here with her daughter.  She has chronic shortness of breath.  This is unchanged.  She had some L arm tightness 1 night while lying in bed 2 weeks ago.  She took NTG with relief. She has not had any further symptoms.  She has not had exertional chest pain.  She has not had syncope.  She sleeps on an incline chronically. She has chronic leg edema without change.     Past  Medical History:  Diagnosis Date  . Allergic rhinitis   . Anemia   . Anxiety   . Barrett esophagus   . CAD (coronary artery disease) 2009   a. Multivessel s/p PCI w/DES 2009 // b. s/p CABG 2011  //  c. LHC 8/15: pLAD 95 ISR, LCx 100, pOM1 40, dRCA 100, S-OM1/OM2 ok, S-D1 ok, S-PDA ok, L-LAD ok, EF 60%  . Carotid artery disease ([place])    a. Carotid US 0/62: RICA 6-94%; LICA 85-46% >> FU 1 year  //  b. Carotid US 9/17: R 1-39%, L 40-59% >> FU 1 year  . Chronic diastolic heart failure ([place])   . CKD (chronic kidney disease), stage II    GFR 60-89 ml/min  . Depression   . Disc disease, degenerative, cervical   . Diverticulosis   . Gastroparesis   . GERD (gastroesophageal reflux disease)   . Gout   . H/O hiatal hernia   . Helicobacter pylori gastritis   . History of echocardiogram    a. Echo 11/13: EF 55% to 60%. Grade 2 diastolic dysfunction, MAC, trivial MR, mild LAE, normal RVSF, mild RAE, PASP 39 mmHg  //  b. Echo 4/17: EF 55-60%, normal wall motion, trivial AI, MAC, moderate LAE, mild RVE, PASP 35 mmHg  . History of thrombocytopenia   . HTN (hypertension)   . Hyperlipidemia   . Hypothyroidism   .  LBP (low back pain)    Lumbar disc disease/lumbar spinal stenosis  . Morbid obesity ([place])   . Myocardial infarction ([place])   . Osteoarthritis   . Osteopenia   . PVD (peripheral vascular disease) ([place])   . Sick sinus syndrome [facility])    MDT Dual-chamber PPM implant 02/2012  . Type II or unspecified type diabetes mellitus without mention of complication, not stated as uncontrolled     Current Medications: Current Meds  Medication Sig  . acetaminophen (TYLENOL) 500 MG tablet Take 500 mg by mouth every 6 (six) hours as needed for headache (pain).  Marland Kitchen allopurinol (ZYLOPRIM) 100 MG tablet Take 100 mg by mouth at bedtime.   Marland Kitchen amLODipine (NORVASC) 5 MG tablet Take 5 mg by mouth 2 (two) times daily.  Marland Kitchen aspirin EC 81 MG tablet Take 81 mg by mouth at bedtime.   Marland Kitchen atorvastatin (LIPITOR) 20 MG  tablet TAKE 1 TABLET BY MOUTH  DAILY  . azelastine (OPTIVAR) 0.05 % ophthalmic solution Place 1 drop into both eyes 2 (two) times daily.  . carvedilol (COREG) 6.25 MG tablet TAKE 1 TABLET BY MOUTH  TWICE DAILY  . COVID-19 mRNA vaccine, Pfizer, 30 MCG/0.3ML injection INJECT AS DIRECTED  . dapagliflozin propanediol (FARXIGA) 5 MG TABS tablet Take 5 mg by mouth daily.  . furosemide (LASIX) 40 MG tablet TAKE 1 TABLET BY MOUTH  MONDAY WEDNESDAY AND FRIDAY WITH POTASSIUM  . insulin NPH Human (HUMULIN N,NOVOLIN N) 100 UNIT/ML injection Inject 5-45 Units into the skin See admin instructions. Inject 45 units subcutaneously every morning and 10 units at night  . Multiple Vitamin (MULTIVITAMIN WITH MINERALS) TABS tablet Take 1 tablet by mouth at bedtime.   . nitroGLYCERIN (NITROSTAT) 0.4 MG SL tablet DISSOLVE ONE TABLET UNDER THE TONGUE EVERY 5 MINUTES AS NEEDED FOR CHEST PAIN.  DO NOT EXCEED A TOTAL OF 3 DOSES IN 15 MINUTES  . ONE TOUCH ULTRA TEST test strip 1 each by Other route daily as needed (blood sugar).   Marland Kitchen oxyCODONE-acetaminophen (PERCOCET/ROXICET) 5-325 MG tablet Take 1 tablet by mouth daily as needed (pain).   . potassium chloride (KLOR-CON) 10 MEQ tablet TAKE 1 TABLET BY MOUTH  MONDAY WEDNESDAY AND FRIDAY WITH LASIX  . sacubitril-valsartan (ENTRESTO) 97-103 MG Take 1 tablet by mouth 2 (two) times daily.  Marland Kitchen tobramycin (TOBREX) 0.3 % ophthalmic solution Place 1 drop into the left eye 4 (four) times daily.  . [DISCONTINUED] clopidogrel (PLAVIX) 75 MG tablet Take 1 tablet (75 mg total) by mouth daily. Please make overdue appt with Dr. Burt Knack before anymore refills. Thank you 2nd attempt   Current Facility-Administered Medications for the 07/21/20 encounter (Office Visit) with Richardson Dopp T, PA-C  Medication  . betamethasone acetate-betamethasone sodium phosphate (CELESTONE) injection 3 mg     Allergies:   Ciprofloxacin, Codeine, Hydrocodone, Penicillins, Shellfish allergy, Diltiazem hcl,  Sulfonamide derivatives, Morphine and related, Lovastatin, and Metformin   Social History   Tobacco Use  . Smoking status: Former Research scientist (life sciences)  . Smokeless tobacco: Never Used  . Tobacco comment: quit 30 yrs ago  Vaping Use  . Vaping Use: Never used  Substance Use Topics  . Alcohol use: No  . Drug use: No     Family Hx: The patient's family history includes Coronary artery disease in an other family member; Diabetes in her mother; Heart attack in her mother; Hypertension in her mother; Stroke in her father.  Review of Systems  Gastrointestinal: Negative for hematochezia and melena.  EKGs/Labs/Other Test Reviewed:    EKG:  EKG is not ordered today.  The ekg ordered today demonstrates n/a  Recent Labs: 11/05/2019: BUN 29; Creatinine, Ser 1.33; Hemoglobin 11.0; Platelets 199; Potassium 3.4; Sodium 138   Recent Lipid Panel Lab Results  Component Value Date/Time   CHOL 149 05/11/2015 07:49 AM   TRIG 66 05/11/2015 07:49 AM   TRIG 192 (H) 04/20/2006 12:46 PM   HDL 97 05/11/2015 07:49 AM   CHOLHDL 1.5 05/11/2015 07:49 AM   LDLCALC 39 05/11/2015 07:49 AM   LDLDIRECT 82.5 09/11/2009 12:00 AM    Labs obtained through [facility] - personally reviewed and interpreted: 06/05/2020: Total cholesterol 147, HDL 83, LDL 48, triglycerides 81, Hgb 12.6, creatinine 1.27, K+ 4.1, ALT 14   Risk Assessment/Calculations:      Physical Exam:    VS:  BP 132/70   Pulse 72   Ht _0  (1.702 m)   Wt 201 lb (91.2 kg)   SpO2 96%   BMI 31.48 kg/m     Wt Readings from Last 3 Encounters:  07/21/20 201 lb (91.2 kg)  02/27/20 198 lb 12.8 oz (90.2 kg)  11/14/18 212 lb (96.2 kg)     Constitutional:      Appearance: Healthy appearance. Not in distress.  Pulmonary:     Effort: Pulmonary effort is normal.     Breath sounds: No wheezing. No rales.  Cardiovascular:     Normal rate. Regular rhythm. Normal S1. Normal S2.     Murmurs: There is no murmur.  Edema:    Pretibial: bilateral 1+ edema of  the pretibial area.    Ankle: bilateral 2+ edema of the ankle. Abdominal:     Palpations: Abdomen is soft.  Musculoskeletal:     Cervical back: Neck supple. Skin:    General: Skin is warm and dry.  Neurological:     Mental Status: Alert and oriented to person, place and time.     Cranial Nerves: Cranial nerves are intact.          ASSESSMENT & PLAN:    1. Coronary artery disease involving native coronary artery of native heart without angina pectoris S/p CABG in 2011.  Cardiac catheterization 2015 with patent bypass grafts.  Low risk Myoview 2017.  She had recent left arm symptoms.  This happened once and has not happened again.  It is not clear if this is an anginal equivalent or noncardiac.  I have asked her to monitor symptoms and let me know if she has any recurrence or worsening symptoms.  Continue current dose of aspirin, clopidogrel, atorvastatin, carvedilol.  Follow-up in 6 months or sooner if symptoms should recur/worsen.  2. Chronic heart failure with preserved ejection fraction (HCC) EF 55-60 by echocardiogram in 2017.  Volume status is fairly stable.  She has a lot of dependent edema in her legs.  She can wear compression stockings and keep her legs elevated.  Continue current dose of furosemide.  3. Cardiac pacemaker in situ Follow-up with the EP as planned  4. Essential hypertension The patient's blood pressure is controlled on her current regimen.  Continue current therapy.   5. Hyperlipidemia, unspecified hyperlipidemia type LDL optimal on most recent lab work.  Continue current Rx.      Dispo:  Return in about 6 months (around 01/20/2021) for Routine follow up in 6 months with Dr.Cooper or Richardson Dopp, PA..   Medication Adjustments/Labs and Tests Ordered: Current medicines are reviewed at length with the patient today.  Concerns regarding medicines are outlined above.  Tests Ordered: No orders of the defined types were placed in this encounter.  Medication  Changes: Meds ordered this encounter  Medications  . clopidogrel (PLAVIX) 75 MG tablet    Sig: One tablet by mouth ( 75 mg) daily.    Dispense:  90 tablet    Refill:  3    Signed, Richardson Dopp, PA-C  07/21/2020 1:18 PM    [facility] [place], [place], [place]  [address] Phone: [phone]; Fax: [phone]

## 2020-07-21 ENCOUNTER — Other Ambulatory Visit: Payer: Self-pay

## 2020-07-21 ENCOUNTER — Encounter: Payer: Self-pay | Admitting: Physician Assistant

## 2020-07-21 ENCOUNTER — Ambulatory Visit: Payer: Medicare Other | Admitting: Physician Assistant

## 2020-07-21 VITALS — BP 132/70 | HR 72 | Ht 67.0 in | Wt 201.0 lb

## 2020-07-21 DIAGNOSIS — I2510 Atherosclerotic heart disease of native coronary artery without angina pectoris: Secondary | ICD-10-CM | POA: Diagnosis not present

## 2020-07-21 DIAGNOSIS — I5032 Chronic diastolic (congestive) heart failure: Secondary | ICD-10-CM | POA: Diagnosis not present

## 2020-07-21 DIAGNOSIS — E785 Hyperlipidemia, unspecified: Secondary | ICD-10-CM

## 2020-07-21 DIAGNOSIS — I10 Essential (primary) hypertension: Secondary | ICD-10-CM | POA: Diagnosis not present

## 2020-07-21 DIAGNOSIS — Z950 Presence of cardiac pacemaker: Secondary | ICD-10-CM

## 2020-07-21 DIAGNOSIS — N183 Chronic kidney disease, stage 3 unspecified: Secondary | ICD-10-CM

## 2020-07-21 MED ORDER — CLOPIDOGREL BISULFATE 75 MG PO TABS: ORAL_TABLET | ORAL | 3 refills | Status: DC

## 2020-07-21 NOTE — Patient Instructions (Signed)
Medication Instructions:  Your physician recommends that you continue on your current medications as directed. Please refer to the Current Medication list given to you today.  *If you need a refill on your cardiac medications before your next appointment, please call your pharmacy*   Lab Work: -None  If you have labs (blood work) drawn today and your tests are completely normal, you will receive your results only by: . MyChart Message (if you have MyChart) OR . A paper copy in the mail If you have any lab test that is abnormal or we need to change your treatment, we will call you to review the results.   Testing/Procedures: -None   Follow-Up: At [facility], you and your health needs are our priority.  As part of our continuing mission to provide you with exceptional heart care, we have created designated Provider Care Teams.  These Care Teams include your primary Cardiologist (physician) and Advanced Practice Providers (APPs -  Physician Assistants and Nurse Practitioners) who all work together to provide you with the care you need, when you need it.  We recommend signing up for the patient portal called "MyChart".  Sign up information is provided on this After Visit Summary.  MyChart is used to connect with patients for Virtual Visits (Telemedicine).  Patients are able to view lab/test results, encounter notes, upcoming appointments, etc.  Non-urgent messages can be sent to your provider as well.   To learn more about what you can do with MyChart, go to [email or web address].    Your next appointment:   6 month(s)  The format for your next appointment:   In Person  Provider:   You may see Michael Cooper, MD or one of the following Advanced Practice Providers on your designated Care Team:    Scott Weaver, PA-C    Other Instructions Your physician wants you to follow-up in: 6 months with Dr. Cooper or Scott Weaver, PA.  You will receive a reminder letter in the mail two  months in advance. If you don't receive a letter, please call our office to schedule the follow-up appointment.   

## 2020-07-22 DIAGNOSIS — R2689 Other abnormalities of gait and mobility: Secondary | ICD-10-CM | POA: Diagnosis not present

## 2020-07-22 DIAGNOSIS — E119 Type 2 diabetes mellitus without complications: Secondary | ICD-10-CM | POA: Diagnosis not present

## 2020-07-22 DIAGNOSIS — Z7984 Long term (current) use of oral hypoglycemic drugs: Secondary | ICD-10-CM | POA: Diagnosis not present

## 2020-07-22 DIAGNOSIS — M6281 Muscle weakness (generalized): Secondary | ICD-10-CM | POA: Diagnosis not present

## 2020-07-22 DIAGNOSIS — I509 Heart failure, unspecified: Secondary | ICD-10-CM | POA: Diagnosis not present

## 2020-07-22 NOTE — Progress Notes (Signed)
Agree. Would stop ASA. Thanks [employer]

## 2020-07-23 ENCOUNTER — Telehealth: Payer: Self-pay | Admitting: Physician Assistant

## 2020-07-23 NOTE — Telephone Encounter (Signed)
Reviewed with Dr. Excell Seltzer. Please call pt. She can stop Aspirin. She should remain on Clopidogrel (Plavix). Tereso Newcomer, PA-C    07/23/2020 12:32 PM

## 2020-07-23 NOTE — Telephone Encounter (Signed)
Spoke with pt and made her aware of information from [facility] [place].  Pt verbalized understanding and was in agreement with plan.

## 2020-07-28 DIAGNOSIS — M6281 Muscle weakness (generalized): Secondary | ICD-10-CM | POA: Diagnosis not present

## 2020-07-28 DIAGNOSIS — Z7984 Long term (current) use of oral hypoglycemic drugs: Secondary | ICD-10-CM | POA: Diagnosis not present

## 2020-07-28 DIAGNOSIS — E119 Type 2 diabetes mellitus without complications: Secondary | ICD-10-CM | POA: Diagnosis not present

## 2020-07-28 DIAGNOSIS — I509 Heart failure, unspecified: Secondary | ICD-10-CM | POA: Diagnosis not present

## 2020-07-28 DIAGNOSIS — R2689 Other abnormalities of gait and mobility: Secondary | ICD-10-CM | POA: Diagnosis not present

## 2020-07-29 ENCOUNTER — Other Ambulatory Visit: Payer: Self-pay | Admitting: *Deleted

## 2020-07-29 NOTE — Patient Outreach (Signed)
[facility]) Care Management  [facility] Care Manager  07/29/2020   [name] 1932/04/20 [id number]  Subjective: Successful telephone outreach call to patient. HIPAA identifiers obtained. Patient reports she is doing well. She continues to take her blood sugar 1-2 daily and her ranges are FBS 80-150 and postprandial ranges of 130 up to an occasional  200. Her last A1c was 6.9 on 07/20/20. Patient states she is at her baseline with her BLE edema and nurse encouraged her to continue to keep her legs elevated when possible and to limit her salt intake. She reports that she did receive [facility] calendar booklet and will start to weigh herself 2-3 times weekly and record the values. Nurse reviewed the CHF education located in the calendar booklet with the patient. Patient states she has lost 60 pounds, she continues to walk routinely under the corridor of her apartment complex. She explains that she has not had any recent falls and she has good support from her family. Nurse congratulated the patient on her dedication to her health and wellness. Patient did not have any further questions or concerns today.    Encounter Medications:  Outpatient Encounter Medications as of 07/29/2020  Medication Sig Note  . acetaminophen (TYLENOL) 500 MG tablet Take 500 mg by mouth every 6 (six) hours as needed for headache (pain).   Marland Kitchen allopurinol (ZYLOPRIM) 100 MG tablet Take 100 mg by mouth at bedtime.    Marland Kitchen amLODipine (NORVASC) 5 MG tablet Take 5 mg by mouth 2 (two) times daily.   Marland Kitchen atorvastatin (LIPITOR) 20 MG tablet TAKE 1 TABLET BY MOUTH  DAILY   . azelastine (OPTIVAR) 0.05 % ophthalmic solution Place 1 drop into both eyes 2 (two) times daily.   . carvedilol (COREG) 6.25 MG tablet TAKE 1 TABLET BY MOUTH  TWICE DAILY   . clopidogrel (PLAVIX) 75 MG tablet One tablet by mouth ( 75 mg) daily.   . dapagliflozin propanediol (FARXIGA) 5 MG TABS tablet Take 5 mg by mouth daily.   . furosemide (LASIX) 40 MG  tablet TAKE 1 TABLET BY MOUTH  MONDAY WEDNESDAY AND FRIDAY WITH POTASSIUM   . insulin NPH Human (HUMULIN N,NOVOLIN N) 100 UNIT/ML injection Inject 5-45 Units into the skin See admin instructions. Inject 45 units subcutaneously every morning and 10 units at night   . Multiple Vitamin (MULTIVITAMIN WITH MINERALS) TABS tablet Take 1 tablet by mouth at bedtime.    . nitroGLYCERIN (NITROSTAT) 0.4 MG SL tablet DISSOLVE ONE TABLET UNDER THE TONGUE EVERY 5 MINUTES AS NEEDED FOR CHEST PAIN.  DO NOT EXCEED A TOTAL OF 3 DOSES IN 15 MINUTES   . ONE TOUCH ULTRA TEST test strip 1 each by Other route daily as needed (blood sugar).    . potassium chloride (KLOR-CON) 10 MEQ tablet TAKE 1 TABLET BY MOUTH  MONDAY WEDNESDAY AND FRIDAY WITH LASIX   . sacubitril-valsartan (ENTRESTO) 97-103 MG Take 1 tablet by mouth 2 (two) times daily.   Marland Kitchen tobramycin (TOBREX) 0.3 % ophthalmic solution Place 1 drop into the left eye 4 (four) times daily.   Marland Kitchen COVID-19 mRNA vaccine, Pfizer, 30 MCG/0.3ML injection INJECT AS DIRECTED   . oxyCODONE-acetaminophen (PERCOCET/ROXICET) 5-325 MG tablet Take 1 tablet by mouth daily as needed (pain).  (Patient not taking: Reported on 07/29/2020) 07/29/2020: Has not taken in a long time   Facility-Administered Encounter Medications as of 07/29/2020  Medication  . betamethasone acetate-betamethasone sodium phosphate (CELESTONE) injection 3 mg    Functional Status:  No  flowsheet data found.  Fall/Depression Screening: Fall Risk  07/29/2020 04/28/2020 02/24/2020  Falls in the past year? 1 1 1   Comment - - last fall Jnuary 2021  Number falls in past yr: 0 0 1  Injury with Fall? 0 0 0  Risk for fall due to : History of fall(s);Impaired balance/gait;Impaired mobility;Impaired vision History of fall(s);Impaired mobility;Impaired balance/gait;Impaired vision History of fall(s);Impaired balance/gait;Impaired mobility;Impaired vision;Medication side effect  Follow up Falls prevention discussed;Education  provided;Falls evaluation completed Falls prevention discussed;Education provided;Falls evaluation completed Falls evaluation completed;Education provided;Falls prevention discussed   PHQ 2/9 Scores 02/24/2020 11/01/2018 10/08/2018  PHQ - 2 Score 0 2 2  PHQ- 9 Score - 2 2    Assessment:  Goals Addressed            This Visit's Progress   . COMPLETED: [facility]) Learn More About My Health       Timeframe:  Long-Range Goal Priority:  High Start Date:  02/24/20                           Expected End Date:  08/08/20                     Follow Up Date 08/08/20   - tell my story and reason for my visit - make a list of questions - ask questions - repeat what I heard to make sure I understand - bring a list of my medicines to the visit - speak up when I don't understand     Why is this important?   The best way to learn about your health and care is by talking to the doctor and nurse.  They will answer your questions and give you information in the way that you like best.    Notes: Patient reports that she does ask questions during  provider visits. She has prepared a list of concerns for her PCP to discuss with her during her upcoming visit. Nurse encouraged the patient to continue to do this for all of her provider appointments.     08/10/20 [facility]) Patient will verbalize continuation of monitoring her blood sugar daily and recording the values for the next 90 days       Timeframe:  Long-Range Goal Priority:  High Start Date:  02/24/20                           Expected End Date: 03/10/21                   Follow Up Date 11/07/20   - check blood sugar at prescribed times - check blood sugar if I feel it is too high or too low - enter blood sugar readings and medication or insulin into daily log - take the blood sugar log to all doctor visits - take the blood sugar meter to all doctor visits  -Encouraged patient to continue to a diet low in sugar and carbohydrates -Encouraged patient to  continue to walk routinely -Encouraged patient to continue to lose weight   Why is this important?   Checking your blood sugar at home helps to keep it from getting very high or very low.  Writing the results in a diary or log helps the doctor know how to care for you.  Your blood sugar log should have the time, date and the results.  Also, write down the amount of  insulin or other medicine that you take.  Other information, like what you ate, exercise done and how you were feeling, will also be helpful.     Notes: Patient reports checking her blood sugar 2-3 times daily and recording the values.  Updated 07/29/20: Patient continues to take her blood sugar 1-2 times daily. She has continued with her weight loss journey and reports she now weighs 199 pounds. Nurse congratulated the patient on her dedication to her health and wellness.    Marland Kitchen [facility]) Patient will verbalize weighing herself 2-3 weekly and monitoring for signs of heart failure daily within the next 90 days       Timeframe:  Long-Range Goal Priority:  Medium Start Date: 02/24/20                            Expected End Date: 03/10/21                     Follow Up Date 11/07/20   - call office if I gain more than 2 pounds in one day or 5 pounds in one week - keep legs up while sitting - track weight in diary - use salt in moderation - watch for swelling in feet, ankles and legs every day -Discussed weighing 2-3 times weekly and recording values in [facility] calendar booklet -Discussed using [facility] calendar resource sheet to monitor for symptoms of heart failure daily -Encouraged patient to limit salt intake -Encouraged patient to continue to walk routinely and lose weight  Why is this important?   It is important to check your weight daily and watch how much salt and liquids you have.  It will help you to manage your heart failure.    Notes: Updated 07/29/20: Patient states she received [facility] calendar booklet, that she would weigh herself  2-3 times weekly, record the values, and use the color zones and rescue action plans to monitor for symptoms of heart failure. Congratulated patient for her dedication to maintaining a healthy lifestyle and losing approximately 60 pounds.      Plan: RN Health Coach will send PCP a quarterly update and will call patient within the month of July. Follow-up:  Patient agrees to Care Plan and Follow-up.  Blanchie Serve RN, BSN [facility] Care Management  RN Health Coach [phone] [email or web address]

## 2020-07-29 NOTE — Patient Instructions (Addendum)
Goals Addressed            This Visit's Progress   . COMPLETED: [facility]) Learn More About My Health       Timeframe:  Long-Range Goal Priority:  High Start Date:  02/24/20                           Expected End Date:  08/08/20                     Follow Up Date 08/08/20   - tell my story and reason for my visit - make a list of questions - ask questions - repeat what I heard to make sure I understand - bring a list of my medicines to the visit - speak up when I don't understand     Why is this important?   The best way to learn about your health and care is by talking to the doctor and nurse.  They will answer your questions and give you information in the way that you like best.    Notes: Patient reports that she does ask questions during  provider visits. She has prepared a list of concerns for her PCP to discuss with her during her upcoming visit. Nurse encouraged the patient to continue to do this for all of her provider appointments.     Marland Kitchen [facility]) Patient will verbalize continuation of monitoring her blood sugar daily and recording the values for the next 90 days       Timeframe:  Long-Range Goal Priority:  High Start Date:  02/24/20                           Expected End Date: 03/10/21                   Follow Up Date 11/07/20   - check blood sugar at prescribed times - check blood sugar if I feel it is too high or too low - enter blood sugar readings and medication or insulin into daily log - take the blood sugar log to all doctor visits - take the blood sugar meter to all doctor visits  -Encouraged patient to continue to a diet low in sugar and carbohydrates -Encouraged patient to continue to walk routinely -Encouraged patient to continue to lose weight   Why is this important?   Checking your blood sugar at home helps to keep it from getting very high or very low.  Writing the results in a diary or log helps the doctor know how to care for you.  Your blood sugar log  should have the time, date and the results.  Also, write down the amount of insulin or other medicine that you take.  Other information, like what you ate, exercise done and how you were feeling, will also be helpful.     Notes: Patient reports checking her blood sugar 2-3 times daily and recording the values.  Updated 07/29/20: Patient continues to take her blood sugar 1-2 times daily. She has continued with her weight loss journey and reports she now weighs 199 pounds. Nurse congratulated the patient on her dedication to her health and wellness.    Marland Kitchen [facility]) Patient will verbalize weighing herself 2-3 weekly and monitoring for signs of heart failure daily within the next 90 days       Timeframe:  Long-Range Goal Priority:  Medium Start  Date: 02/24/20                            Expected End Date: 03/10/21                     Follow Up Date 11/07/20   - call office if I gain more than 2 pounds in one day or 5 pounds in one week - keep legs up while sitting - track weight in diary - use salt in moderation - watch for swelling in feet, ankles and legs every day -Discussed weighing 2-3 times weekly and recording values in [facility] calendar booklet -Discussed using [facility] calendar resource sheet to monitor for symptoms of heart failure daily -Encouraged patient to limit salt intake -Encouraged patient to continue to walk routinely and lose weight  Why is this important?   It is important to check your weight daily and watch how much salt and liquids you have.  It will help you to manage your heart failure.    Notes: Updated 07/29/20: Patient states she received [facility] calendar booklet, that she would weigh herself 2-3 times weekly, record the values, and use the color zones and rescue action plans to monitor for symptoms of heart failure. Congratulated patient for her dedication to maintaining a healthy lifestyle and losing approximately 60 pounds.

## 2020-07-31 DIAGNOSIS — R2689 Other abnormalities of gait and mobility: Secondary | ICD-10-CM | POA: Diagnosis not present

## 2020-07-31 DIAGNOSIS — I509 Heart failure, unspecified: Secondary | ICD-10-CM | POA: Diagnosis not present

## 2020-07-31 DIAGNOSIS — Z7984 Long term (current) use of oral hypoglycemic drugs: Secondary | ICD-10-CM | POA: Diagnosis not present

## 2020-07-31 DIAGNOSIS — E119 Type 2 diabetes mellitus without complications: Secondary | ICD-10-CM | POA: Diagnosis not present

## 2020-07-31 DIAGNOSIS — M6281 Muscle weakness (generalized): Secondary | ICD-10-CM | POA: Diagnosis not present

## 2020-08-04 DIAGNOSIS — R2689 Other abnormalities of gait and mobility: Secondary | ICD-10-CM | POA: Diagnosis not present

## 2020-08-04 DIAGNOSIS — Z7984 Long term (current) use of oral hypoglycemic drugs: Secondary | ICD-10-CM | POA: Diagnosis not present

## 2020-08-04 DIAGNOSIS — I509 Heart failure, unspecified: Secondary | ICD-10-CM | POA: Diagnosis not present

## 2020-08-04 DIAGNOSIS — E113293 Type 2 diabetes mellitus with mild nonproliferative diabetic retinopathy without macular edema, bilateral: Secondary | ICD-10-CM | POA: Diagnosis not present

## 2020-08-04 DIAGNOSIS — H35371 Puckering of macula, right eye: Secondary | ICD-10-CM | POA: Diagnosis not present

## 2020-08-04 DIAGNOSIS — H353111 Nonexudative age-related macular degeneration, right eye, early dry stage: Secondary | ICD-10-CM | POA: Diagnosis not present

## 2020-08-04 DIAGNOSIS — H353221 Exudative age-related macular degeneration, left eye, with active choroidal neovascularization: Secondary | ICD-10-CM | POA: Diagnosis not present

## 2020-08-04 DIAGNOSIS — E119 Type 2 diabetes mellitus without complications: Secondary | ICD-10-CM | POA: Diagnosis not present

## 2020-08-04 DIAGNOSIS — M6281 Muscle weakness (generalized): Secondary | ICD-10-CM | POA: Diagnosis not present

## 2020-08-06 DIAGNOSIS — R2689 Other abnormalities of gait and mobility: Secondary | ICD-10-CM | POA: Diagnosis not present

## 2020-08-06 DIAGNOSIS — I509 Heart failure, unspecified: Secondary | ICD-10-CM | POA: Diagnosis not present

## 2020-08-06 DIAGNOSIS — Z7984 Long term (current) use of oral hypoglycemic drugs: Secondary | ICD-10-CM | POA: Diagnosis not present

## 2020-08-06 DIAGNOSIS — M6281 Muscle weakness (generalized): Secondary | ICD-10-CM | POA: Diagnosis not present

## 2020-08-06 DIAGNOSIS — E119 Type 2 diabetes mellitus without complications: Secondary | ICD-10-CM | POA: Diagnosis not present

## 2020-08-08 DIAGNOSIS — I130 Hypertensive heart and chronic kidney disease with heart failure and stage 1 through stage 4 chronic kidney disease, or unspecified chronic kidney disease: Secondary | ICD-10-CM | POA: Diagnosis not present

## 2020-08-08 DIAGNOSIS — N1831 Chronic kidney disease, stage 3a: Secondary | ICD-10-CM | POA: Diagnosis not present

## 2020-08-08 DIAGNOSIS — E1122 Type 2 diabetes mellitus with diabetic chronic kidney disease: Secondary | ICD-10-CM | POA: Diagnosis not present

## 2020-08-08 DIAGNOSIS — I5032 Chronic diastolic (congestive) heart failure: Secondary | ICD-10-CM | POA: Diagnosis not present

## 2020-08-10 DIAGNOSIS — M6281 Muscle weakness (generalized): Secondary | ICD-10-CM | POA: Diagnosis not present

## 2020-08-10 DIAGNOSIS — E119 Type 2 diabetes mellitus without complications: Secondary | ICD-10-CM | POA: Diagnosis not present

## 2020-08-10 DIAGNOSIS — R2689 Other abnormalities of gait and mobility: Secondary | ICD-10-CM | POA: Diagnosis not present

## 2020-08-10 DIAGNOSIS — Z7984 Long term (current) use of oral hypoglycemic drugs: Secondary | ICD-10-CM | POA: Diagnosis not present

## 2020-08-10 DIAGNOSIS — I509 Heart failure, unspecified: Secondary | ICD-10-CM | POA: Diagnosis not present

## 2020-08-14 DIAGNOSIS — R2689 Other abnormalities of gait and mobility: Secondary | ICD-10-CM | POA: Diagnosis not present

## 2020-08-14 DIAGNOSIS — Z7984 Long term (current) use of oral hypoglycemic drugs: Secondary | ICD-10-CM | POA: Diagnosis not present

## 2020-08-14 DIAGNOSIS — M6281 Muscle weakness (generalized): Secondary | ICD-10-CM | POA: Diagnosis not present

## 2020-08-14 DIAGNOSIS — E119 Type 2 diabetes mellitus without complications: Secondary | ICD-10-CM | POA: Diagnosis not present

## 2020-08-14 DIAGNOSIS — I509 Heart failure, unspecified: Secondary | ICD-10-CM | POA: Diagnosis not present

## 2020-09-08 DIAGNOSIS — E1122 Type 2 diabetes mellitus with diabetic chronic kidney disease: Secondary | ICD-10-CM | POA: Diagnosis not present

## 2020-09-08 DIAGNOSIS — N1831 Chronic kidney disease, stage 3a: Secondary | ICD-10-CM | POA: Diagnosis not present

## 2020-09-08 DIAGNOSIS — I5032 Chronic diastolic (congestive) heart failure: Secondary | ICD-10-CM | POA: Diagnosis not present

## 2020-09-08 DIAGNOSIS — I130 Hypertensive heart and chronic kidney disease with heart failure and stage 1 through stage 4 chronic kidney disease, or unspecified chronic kidney disease: Secondary | ICD-10-CM | POA: Diagnosis not present

## 2020-09-24 DIAGNOSIS — I1310 Hypertensive heart and chronic kidney disease without heart failure, with stage 1 through stage 4 chronic kidney disease, or unspecified chronic kidney disease: Secondary | ICD-10-CM | POA: Diagnosis not present

## 2020-09-24 DIAGNOSIS — E559 Vitamin D deficiency, unspecified: Secondary | ICD-10-CM | POA: Diagnosis not present

## 2020-09-24 DIAGNOSIS — I10 Essential (primary) hypertension: Secondary | ICD-10-CM | POA: Diagnosis not present

## 2020-09-24 DIAGNOSIS — J449 Chronic obstructive pulmonary disease, unspecified: Secondary | ICD-10-CM | POA: Diagnosis not present

## 2020-09-24 DIAGNOSIS — L209 Atopic dermatitis, unspecified: Secondary | ICD-10-CM | POA: Diagnosis not present

## 2020-09-24 DIAGNOSIS — M130 Polyarthritis, unspecified: Secondary | ICD-10-CM | POA: Diagnosis not present

## 2020-09-24 DIAGNOSIS — I130 Hypertensive heart and chronic kidney disease with heart failure and stage 1 through stage 4 chronic kidney disease, or unspecified chronic kidney disease: Secondary | ICD-10-CM | POA: Diagnosis not present

## 2020-09-24 DIAGNOSIS — E1169 Type 2 diabetes mellitus with other specified complication: Secondary | ICD-10-CM | POA: Diagnosis not present

## 2020-09-29 DIAGNOSIS — H348312 Tributary (branch) retinal vein occlusion, right eye, stable: Secondary | ICD-10-CM | POA: Diagnosis not present

## 2020-09-29 DIAGNOSIS — H353221 Exudative age-related macular degeneration, left eye, with active choroidal neovascularization: Secondary | ICD-10-CM | POA: Diagnosis not present

## 2020-09-29 DIAGNOSIS — H353111 Nonexudative age-related macular degeneration, right eye, early dry stage: Secondary | ICD-10-CM | POA: Diagnosis not present

## 2020-09-29 DIAGNOSIS — E113293 Type 2 diabetes mellitus with mild nonproliferative diabetic retinopathy without macular edema, bilateral: Secondary | ICD-10-CM | POA: Diagnosis not present

## 2020-10-08 DIAGNOSIS — E039 Hypothyroidism, unspecified: Secondary | ICD-10-CM | POA: Diagnosis not present

## 2020-10-08 DIAGNOSIS — I1310 Hypertensive heart and chronic kidney disease without heart failure, with stage 1 through stage 4 chronic kidney disease, or unspecified chronic kidney disease: Secondary | ICD-10-CM | POA: Diagnosis not present

## 2020-10-08 DIAGNOSIS — I10 Essential (primary) hypertension: Secondary | ICD-10-CM | POA: Diagnosis not present

## 2020-10-08 DIAGNOSIS — M130 Polyarthritis, unspecified: Secondary | ICD-10-CM | POA: Diagnosis not present

## 2020-10-08 DIAGNOSIS — R609 Edema, unspecified: Secondary | ICD-10-CM | POA: Diagnosis not present

## 2020-10-08 DIAGNOSIS — E1169 Type 2 diabetes mellitus with other specified complication: Secondary | ICD-10-CM | POA: Diagnosis not present

## 2020-10-21 DIAGNOSIS — I1310 Hypertensive heart and chronic kidney disease without heart failure, with stage 1 through stage 4 chronic kidney disease, or unspecified chronic kidney disease: Secondary | ICD-10-CM | POA: Diagnosis not present

## 2020-10-21 DIAGNOSIS — E039 Hypothyroidism, unspecified: Secondary | ICD-10-CM | POA: Diagnosis not present

## 2020-10-21 DIAGNOSIS — I10 Essential (primary) hypertension: Secondary | ICD-10-CM | POA: Diagnosis not present

## 2020-10-21 DIAGNOSIS — E1169 Type 2 diabetes mellitus with other specified complication: Secondary | ICD-10-CM | POA: Diagnosis not present

## 2020-10-21 DIAGNOSIS — M130 Polyarthritis, unspecified: Secondary | ICD-10-CM | POA: Diagnosis not present

## 2020-10-21 DIAGNOSIS — R609 Edema, unspecified: Secondary | ICD-10-CM | POA: Diagnosis not present

## 2020-10-22 ENCOUNTER — Other Ambulatory Visit: Payer: Self-pay | Admitting: Family Medicine

## 2020-10-22 ENCOUNTER — Ambulatory Visit
Admission: RE | Admit: 2020-10-22 | Discharge: 2020-10-22 | Disposition: A | Discharge status: Home / Self Care | Payer: Medicare Other | Source: Ambulatory Visit | Attending: Family Medicine | Admitting: Family Medicine

## 2020-10-22 ENCOUNTER — Other Ambulatory Visit: Payer: Self-pay

## 2020-10-22 DIAGNOSIS — R52 Pain, unspecified: Secondary | ICD-10-CM

## 2020-10-22 DIAGNOSIS — M25561 Pain in right knee: Secondary | ICD-10-CM | POA: Diagnosis not present

## 2020-10-22 DIAGNOSIS — M25559 Pain in unspecified hip: Secondary | ICD-10-CM

## 2020-10-22 DIAGNOSIS — M1611 Unilateral primary osteoarthritis, right hip: Secondary | ICD-10-CM | POA: Diagnosis not present

## 2020-10-30 ENCOUNTER — Other Ambulatory Visit: Payer: Self-pay | Admitting: *Deleted

## 2020-10-30 NOTE — Patient Instructions (Addendum)
Goals Addressed             This Visit's Progress    [facility]) Patient will verbalize continuation of monitoring her blood sugar daily and recording the values for the next 90 days   On track    Timeframe:  Long-Range Goal Priority:  High Start Date:  02/24/20                           Expected End Date: 03/10/21                   Follow Up Date 02/07/21   - check blood sugar at prescribed times - check blood sugar if I feel it is too high or too low - enter blood sugar readings and medication or insulin into daily log - take the blood sugar log to all doctor visits - take the blood sugar meter to all doctor visits  -Encouraged patient to continue to a diet low in sugar and carbohydrates -Encouraged patient to continue to walk routinely -Encouraged patient to continue to lose weight   Why is this important?   Checking your blood sugar at home helps to keep it from getting very high or very low.  Writing the results in a diary or log helps the doctor know how to care for you.  Your blood sugar log should have the time, date and the results.  Also, write down the amount of insulin or other medicine that you take.  Other information, like what you ate, exercise done and how you were feeling, will also be helpful.     Notes: Patient reports checking her blood sugar 2-3 times daily and recording the values.  Updated 07/29/20: Patient continues to take her blood sugar 1-2 times daily. She has continued with her weight loss journey and reports she now weighs 199 pounds. Nurse congratulated the patient on her dedication to her health and wellness.  Updated 10/30/20: Patient reports continuation of taking her blood sugar daily. Patient states that her diabetes is currently under control, her last A1c was 6.8 on 09/24/20, and her A1c goal is 7.     [facility]) Patient will verbalize continuation of weighing herself 2-3 weekly and monitoring for signs of heart failure daily within the next 90 days   On  track    Timeframe:  Long-Range Goal Priority:  Medium Start Date: 02/24/20                            Expected End Date: 03/10/21                     Follow Up Date 02/07/21   - call office if I gain more than 2 pounds in one day or 5 pounds in one week - keep legs up while sitting - track weight in diary - use salt in moderation - watch for swelling in feet, ankles and legs every day -Discussed weighing 2-3 times weekly and recording values in [facility] calendar booklet -Discussed using [facility] calendar resource sheet to monitor for symptoms of heart failure daily -Encouraged patient to limit salt intake -Encouraged patient to continue to walk routinely and lose weight  Why is this important?   It is important to check your weight daily and watch how much salt and liquids you have.  It will help you to manage your heart failure.    Notes:  Updated 07/29/20: Patient states she received [facility] calendar booklet, that she would weigh herself 2-3 times weekly, record the values, and use the color zones and rescue action plans to monitor for symptoms of heart failure. Congratulated patient for her dedication to maintaining a healthy lifestyle and losing approximately 60 pounds.  Updated 10/30/20: Patient states she continues to weigh herself 2-3 times weekly and records her values. She is working with PT 2 times a week and does continue to walk routinely. Patient's  reports her weight  has remained stable currently 197 and that she has maintained her 60 pound weight loss which is her goal. Patient states she continues to make healthier food choices and uses portion control.  Nurse congratulated the patient for maintaining her weight goal.     [facility]) Patient will verbalize monitoring their B/P 2 times weekly within the next 90 days       Timeframe:  Long-Range Goal Priority:  Medium Start Date:  10/30/20                           Expected End Date: 10/31/20                     Follow Up Date 02/07/21     -  write blood pressure results in a log or diary -Encouraged patient to take her B/P 2 times weekly and record the values into her calendar booklet   -Encouraged patient to limit salt in her diet -Encouraged continuation of walking routinely  Why is this important?   You won't feel high blood pressure, but it can still hurt your blood vessels.  High blood pressure can cause heart or kidney problems. It can also cause a stroke.  Making lifestyle changes like losing a little weight or eating less salt will help.  Checking your blood pressure at home and at different times of the day can help to control blood pressure.  If the doctor prescribes medicine remember to take it the way the doctor ordered.  Call the office if you cannot afford the medicine or if there are questions about it.     Notes: 10/30/20: Patient states that she does not take her B/P routinely. Her last remembered B/P was 156/67. Nurse discussed that this value is high and encouraged patient to take her B/P at home. Patient states she will take her B/P and write the value into her calendar booklet every Tuesday and Thursday. Nurse will send hypertension education.

## 2020-10-30 NOTE — Patient Outreach (Signed)
[facility]) Care Management  [facility] Care Manager  10/30/2020   [name] 12-29-1932 [id number]  Subjective: Successful telephone outreach call to patient. HIPAA identifiers obtained. Patient reports feeling good and continuation of taking her blood sugar daily. Patient states that her diabetes is currently under control, her last A1c was 6.8 on 09/24/20, and her A1c goal is 7. Patient states she continues to weigh herself 2-3 times weekly and records her values. She is working with PT 2 times a week and does continue to walk routinely. Patient's  reports her weight  has remained stable currently 197 and that she has maintained her 60 pound weight loss which is her goal. Patient states she continues to make healthier food choices and uses portion control. Nurse congratulated the patient for maintaining her weight goal. Patient states that she does not take her B/P routinely. Her last remembered B/P was 156/67. Nurse discussed that this value is high and encouraged patient to take her B/P at home. Patient states she will take her B/P and write the value into her calendar booklet every Tuesday and Thursday. Nurse will send hypertension education. Patient explains that she has a PCP appointment next week and plans to get an order for a new cane. She states she is well supported by her family and did not have any further questions or concerns today. Nurse did confirm that she has this nurse's contact number to call her if needed.   Encounter Medications:  Outpatient Encounter Medications as of 10/30/2020  Medication Sig Note   acetaminophen (TYLENOL) 500 MG tablet Take 500 mg by mouth every 6 (six) hours as needed for headache (pain).    allopurinol (ZYLOPRIM) 100 MG tablet Take 100 mg by mouth at bedtime.     amLODipine (NORVASC) 5 MG tablet Take 5 mg by mouth 2 (two) times daily.    atorvastatin (LIPITOR) 20 MG tablet TAKE 1 TABLET BY MOUTH  DAILY    azelastine (OPTIVAR) 0.05 %  ophthalmic solution Place 1 drop into both eyes 2 (two) times daily.    carvedilol (COREG) 6.25 MG tablet TAKE 1 TABLET BY MOUTH  TWICE DAILY    clopidogrel (PLAVIX) 75 MG tablet One tablet by mouth ( 75 mg) daily.    COVID-19 mRNA vaccine, Pfizer, 30 MCG/0.3ML injection INJECT AS DIRECTED    dapagliflozin propanediol (FARXIGA) 5 MG TABS tablet Take 5 mg by mouth daily.    furosemide (LASIX) 40 MG tablet TAKE 1 TABLET BY MOUTH  MONDAY WEDNESDAY AND FRIDAY WITH POTASSIUM    insulin NPH Human (HUMULIN N,NOVOLIN N) 100 UNIT/ML injection Inject 5-45 Units into the skin See admin instructions. Inject 45 units subcutaneously every morning and 10 units at night    Multiple Vitamin (MULTIVITAMIN WITH MINERALS) TABS tablet Take 1 tablet by mouth at bedtime.     nitroGLYCERIN (NITROSTAT) 0.4 MG SL tablet DISSOLVE ONE TABLET UNDER THE TONGUE EVERY 5 MINUTES AS NEEDED FOR CHEST PAIN.  DO NOT EXCEED A TOTAL OF 3 DOSES IN 15 MINUTES    ONE TOUCH ULTRA TEST test strip 1 each by Other route daily as needed (blood sugar).     potassium chloride (KLOR-CON) 10 MEQ tablet TAKE 1 TABLET BY MOUTH  MONDAY WEDNESDAY AND FRIDAY WITH LASIX    sacubitril-valsartan (ENTRESTO) 97-103 MG Take 1 tablet by mouth 2 (two) times daily.    tobramycin (TOBREX) 0.3 % ophthalmic solution Place 1 drop into the left eye 4 (four) times daily.  oxyCODONE-acetaminophen (PERCOCET/ROXICET) 5-325 MG tablet Take 1 tablet by mouth daily as needed (pain).  (Patient not taking: No sig reported) 10/30/2020: Has not taken for a long time   Facility-Administered Encounter Medications as of 10/30/2020  Medication   betamethasone acetate-betamethasone sodium phosphate (CELESTONE) injection 3 mg    Functional Status:  No flowsheet data found.  Fall/Depression Screening: Fall Risk  10/30/2020 07/29/2020 04/28/2020  Falls in the past year? 0 1 1  Comment - - -  Number falls in past yr: 0 0 0  Injury with Fall? 0 0 0  Risk for fall due to : Impaired  balance/gait;Impaired mobility History of fall(s);Impaired balance/gait;Impaired mobility;Impaired vision History of fall(s);Impaired mobility;Impaired balance/gait;Impaired vision  Follow up Falls prevention discussed;Education provided;Falls evaluation completed Falls prevention discussed;Education provided;Falls evaluation completed Falls prevention discussed;Education provided;Falls evaluation completed   PHQ 2/9 Scores 02/24/2020 11/01/2018 10/08/2018  PHQ - 2 Score 0 2 2  PHQ- 9 Score - 2 2    Assessment:   Care Plan There are no care plans that you recently modified to display for this patient.    Goals Addressed             This Visit's Progress    [facility]) Patient will verbalize continuation of monitoring her blood sugar daily and recording the values for the next 90 days   On track    Timeframe:  Long-Range Goal Priority:  High Start Date:  02/24/20                           Expected End Date: 03/10/21                   Follow Up Date 02/07/21   - check blood sugar at prescribed times - check blood sugar if I feel it is too high or too low - enter blood sugar readings and medication or insulin into daily log - take the blood sugar log to all doctor visits - take the blood sugar meter to all doctor visits  -Encouraged patient to continue to a diet low in sugar and carbohydrates -Encouraged patient to continue to walk routinely -Encouraged patient to continue to lose weight   Why is this important?   Checking your blood sugar at home helps to keep it from getting very high or very low.  Writing the results in a diary or log helps the doctor know how to care for you.  Your blood sugar log should have the time, date and the results.  Also, write down the amount of insulin or other medicine that you take.  Other information, like what you ate, exercise done and how you were feeling, will also be helpful.     Notes: Patient reports checking her blood sugar 2-3 times daily  and recording the values.  Updated 07/29/20: Patient continues to take her blood sugar 1-2 times daily. She has continued with her weight loss journey and reports she now weighs 199 pounds. Nurse congratulated the patient on her dedication to her health and wellness.  Updated 10/30/20: Patient reports continuation of taking her blood sugar daily. Patient states that her diabetes is currently under control, her last A1c was 6.8 on 09/24/20, and her A1c goal is 7.     [facility]) Patient will verbalize continuation of weighing herself 2-3 weekly and monitoring for signs of heart failure daily within the next 90 days   On track    Timeframe:  [place]  Goal Priority:  Medium Start Date: 02/24/20                            Expected End Date: 03/10/21                     Follow Up Date 02/07/21   - call office if I gain more than 2 pounds in one day or 5 pounds in one week - keep legs up while sitting - track weight in diary - use salt in moderation - watch for swelling in feet, ankles and legs every day -Discussed weighing 2-3 times weekly and recording values in [facility] calendar booklet -Discussed using [facility] calendar resource sheet to monitor for symptoms of heart failure daily -Encouraged patient to limit salt intake -Encouraged patient to continue to walk routinely and lose weight  Why is this important?   It is important to check your weight daily and watch how much salt and liquids you have.  It will help you to manage your heart failure.    Notes: Updated 07/29/20: Patient states she received [facility] calendar booklet, that she would weigh herself 2-3 times weekly, record the values, and use the color zones and rescue action plans to monitor for symptoms of heart failure. Congratulated patient for her dedication to maintaining a healthy lifestyle and losing approximately 60 pounds.  Updated 10/30/20: Patient states she continues to weigh herself 2-3 times weekly and records her values. She is working  with PT 2 times a week and does continue to walk routinely. Patient's  reports her weight  has remained stable currently 197 and that she has maintained her 60 pound weight loss which is her goal. Patient states she continues to make healthier food choices and uses portion control.  Nurse congratulated the patient for maintaining her weight goal.     [facility]) Patient will verbalize monitoring their B/P 2 times weekly within the next 90 days       Timeframe:  Long-Range Goal Priority:  Medium Start Date:  10/30/20                           Expected End Date: 10/31/20                     Follow Up Date 02/07/21     - write blood pressure results in a log or diary -Encouraged patient to take her B/P 2 times weekly and record the values into her calendar booklet   -Encouraged patient to limit salt in her diet -Encouraged continuation of walking routinely  Why is this important?   You won't feel high blood pressure, but it can still hurt your blood vessels.  High blood pressure can cause heart or kidney problems. It can also cause a stroke.  Making lifestyle changes like losing a little weight or eating less salt will help.  Checking your blood pressure at home and at different times of the day can help to control blood pressure.  If the doctor prescribes medicine remember to take it the way the doctor ordered.  Call the office if you cannot afford the medicine or if there are questions about it.     Notes: 10/30/20: Patient states that she does not take her B/P routinely. Her last remembered B/P was 156/67. Nurse discussed that this value is high and encouraged patient to take her B/P at home.  Patient states she will take her B/P and write the value into her calendar booklet every Tuesday and Thursday. Nurse will send hypertension education.       Plan: RN Health Coach will send PCP a quarterly report, will send patient hypertension education, and will call patient within the month of  October. Follow-up: Patient agrees to Care Plan and Follow-up.  Blanchie ServeJill Adley Castello RN, BSN [facility] Care Management  RN Health Coach [phone] [email or web address]

## 2020-11-02 DIAGNOSIS — Z03818 Encounter for observation for suspected exposure to other biological agents ruled out: Secondary | ICD-10-CM | POA: Diagnosis not present

## 2020-11-02 DIAGNOSIS — Z20822 Contact with and (suspected) exposure to covid-19: Secondary | ICD-10-CM | POA: Diagnosis not present

## 2020-11-03 DIAGNOSIS — H348312 Tributary (branch) retinal vein occlusion, right eye, stable: Secondary | ICD-10-CM | POA: Diagnosis not present

## 2020-11-03 DIAGNOSIS — E113293 Type 2 diabetes mellitus with mild nonproliferative diabetic retinopathy without macular edema, bilateral: Secondary | ICD-10-CM | POA: Diagnosis not present

## 2020-11-03 DIAGNOSIS — H353111 Nonexudative age-related macular degeneration, right eye, early dry stage: Secondary | ICD-10-CM | POA: Diagnosis not present

## 2020-11-03 DIAGNOSIS — H353221 Exudative age-related macular degeneration, left eye, with active choroidal neovascularization: Secondary | ICD-10-CM | POA: Diagnosis not present

## 2020-11-03 DIAGNOSIS — H43813 Vitreous degeneration, bilateral: Secondary | ICD-10-CM | POA: Diagnosis not present

## 2020-12-01 DIAGNOSIS — I1310 Hypertensive heart and chronic kidney disease without heart failure, with stage 1 through stage 4 chronic kidney disease, or unspecified chronic kidney disease: Secondary | ICD-10-CM | POA: Diagnosis not present

## 2020-12-01 DIAGNOSIS — E778 Other disorders of glycoprotein metabolism: Secondary | ICD-10-CM | POA: Diagnosis not present

## 2020-12-01 DIAGNOSIS — N1831 Chronic kidney disease, stage 3a: Secondary | ICD-10-CM | POA: Diagnosis not present

## 2020-12-01 DIAGNOSIS — E1122 Type 2 diabetes mellitus with diabetic chronic kidney disease: Secondary | ICD-10-CM | POA: Diagnosis not present

## 2020-12-01 DIAGNOSIS — I5032 Chronic diastolic (congestive) heart failure: Secondary | ICD-10-CM | POA: Diagnosis not present

## 2020-12-01 DIAGNOSIS — E1169 Type 2 diabetes mellitus with other specified complication: Secondary | ICD-10-CM | POA: Diagnosis not present

## 2020-12-04 DIAGNOSIS — Z9181 History of falling: Secondary | ICD-10-CM | POA: Diagnosis not present

## 2020-12-04 DIAGNOSIS — Z7902 Long term (current) use of antithrombotics/antiplatelets: Secondary | ICD-10-CM | POA: Diagnosis not present

## 2020-12-04 DIAGNOSIS — U071 COVID-19: Secondary | ICD-10-CM | POA: Diagnosis not present

## 2020-12-04 DIAGNOSIS — Z794 Long term (current) use of insulin: Secondary | ICD-10-CM | POA: Diagnosis not present

## 2020-12-04 DIAGNOSIS — I110 Hypertensive heart disease with heart failure: Secondary | ICD-10-CM | POA: Diagnosis not present

## 2020-12-04 DIAGNOSIS — I509 Heart failure, unspecified: Secondary | ICD-10-CM | POA: Diagnosis not present

## 2020-12-04 DIAGNOSIS — E119 Type 2 diabetes mellitus without complications: Secondary | ICD-10-CM | POA: Diagnosis not present

## 2020-12-04 DIAGNOSIS — Z792 Long term (current) use of antibiotics: Secondary | ICD-10-CM | POA: Diagnosis not present

## 2020-12-08 DIAGNOSIS — I110 Hypertensive heart disease with heart failure: Secondary | ICD-10-CM | POA: Diagnosis not present

## 2020-12-08 DIAGNOSIS — I509 Heart failure, unspecified: Secondary | ICD-10-CM | POA: Diagnosis not present

## 2020-12-08 DIAGNOSIS — U071 COVID-19: Secondary | ICD-10-CM | POA: Diagnosis not present

## 2020-12-08 DIAGNOSIS — Z9181 History of falling: Secondary | ICD-10-CM | POA: Diagnosis not present

## 2020-12-08 DIAGNOSIS — Z792 Long term (current) use of antibiotics: Secondary | ICD-10-CM | POA: Diagnosis not present

## 2020-12-08 DIAGNOSIS — Z794 Long term (current) use of insulin: Secondary | ICD-10-CM | POA: Diagnosis not present

## 2020-12-08 DIAGNOSIS — Z7902 Long term (current) use of antithrombotics/antiplatelets: Secondary | ICD-10-CM | POA: Diagnosis not present

## 2020-12-08 DIAGNOSIS — E119 Type 2 diabetes mellitus without complications: Secondary | ICD-10-CM | POA: Diagnosis not present

## 2020-12-10 DIAGNOSIS — I110 Hypertensive heart disease with heart failure: Secondary | ICD-10-CM | POA: Diagnosis not present

## 2020-12-10 DIAGNOSIS — Z792 Long term (current) use of antibiotics: Secondary | ICD-10-CM | POA: Diagnosis not present

## 2020-12-10 DIAGNOSIS — Z9181 History of falling: Secondary | ICD-10-CM | POA: Diagnosis not present

## 2020-12-10 DIAGNOSIS — I509 Heart failure, unspecified: Secondary | ICD-10-CM | POA: Diagnosis not present

## 2020-12-10 DIAGNOSIS — U071 COVID-19: Secondary | ICD-10-CM | POA: Diagnosis not present

## 2020-12-10 DIAGNOSIS — E119 Type 2 diabetes mellitus without complications: Secondary | ICD-10-CM | POA: Diagnosis not present

## 2020-12-10 DIAGNOSIS — Z794 Long term (current) use of insulin: Secondary | ICD-10-CM | POA: Diagnosis not present

## 2020-12-10 DIAGNOSIS — Z7902 Long term (current) use of antithrombotics/antiplatelets: Secondary | ICD-10-CM | POA: Diagnosis not present

## 2020-12-14 ENCOUNTER — Other Ambulatory Visit: Payer: Self-pay | Admitting: Cardiovascular Disease

## 2020-12-14 DIAGNOSIS — E785 Hyperlipidemia, unspecified: Secondary | ICD-10-CM

## 2020-12-17 DIAGNOSIS — E119 Type 2 diabetes mellitus without complications: Secondary | ICD-10-CM | POA: Diagnosis not present

## 2020-12-17 DIAGNOSIS — Z7902 Long term (current) use of antithrombotics/antiplatelets: Secondary | ICD-10-CM | POA: Diagnosis not present

## 2020-12-17 DIAGNOSIS — U071 COVID-19: Secondary | ICD-10-CM | POA: Diagnosis not present

## 2020-12-17 DIAGNOSIS — I110 Hypertensive heart disease with heart failure: Secondary | ICD-10-CM | POA: Diagnosis not present

## 2020-12-17 DIAGNOSIS — Z794 Long term (current) use of insulin: Secondary | ICD-10-CM | POA: Diagnosis not present

## 2020-12-17 DIAGNOSIS — I509 Heart failure, unspecified: Secondary | ICD-10-CM | POA: Diagnosis not present

## 2020-12-17 DIAGNOSIS — Z9181 History of falling: Secondary | ICD-10-CM | POA: Diagnosis not present

## 2020-12-17 DIAGNOSIS — Z792 Long term (current) use of antibiotics: Secondary | ICD-10-CM | POA: Diagnosis not present

## 2020-12-18 DIAGNOSIS — Z7902 Long term (current) use of antithrombotics/antiplatelets: Secondary | ICD-10-CM | POA: Diagnosis not present

## 2020-12-18 DIAGNOSIS — U071 COVID-19: Secondary | ICD-10-CM | POA: Diagnosis not present

## 2020-12-18 DIAGNOSIS — I110 Hypertensive heart disease with heart failure: Secondary | ICD-10-CM | POA: Diagnosis not present

## 2020-12-18 DIAGNOSIS — Z9181 History of falling: Secondary | ICD-10-CM | POA: Diagnosis not present

## 2020-12-18 DIAGNOSIS — Z792 Long term (current) use of antibiotics: Secondary | ICD-10-CM | POA: Diagnosis not present

## 2020-12-18 DIAGNOSIS — Z794 Long term (current) use of insulin: Secondary | ICD-10-CM | POA: Diagnosis not present

## 2020-12-18 DIAGNOSIS — E119 Type 2 diabetes mellitus without complications: Secondary | ICD-10-CM | POA: Diagnosis not present

## 2020-12-18 DIAGNOSIS — I509 Heart failure, unspecified: Secondary | ICD-10-CM | POA: Diagnosis not present

## 2020-12-22 DIAGNOSIS — Z9181 History of falling: Secondary | ICD-10-CM | POA: Diagnosis not present

## 2020-12-22 DIAGNOSIS — I110 Hypertensive heart disease with heart failure: Secondary | ICD-10-CM | POA: Diagnosis not present

## 2020-12-22 DIAGNOSIS — E119 Type 2 diabetes mellitus without complications: Secondary | ICD-10-CM | POA: Diagnosis not present

## 2020-12-22 DIAGNOSIS — Z792 Long term (current) use of antibiotics: Secondary | ICD-10-CM | POA: Diagnosis not present

## 2020-12-22 DIAGNOSIS — Z7902 Long term (current) use of antithrombotics/antiplatelets: Secondary | ICD-10-CM | POA: Diagnosis not present

## 2020-12-22 DIAGNOSIS — Z794 Long term (current) use of insulin: Secondary | ICD-10-CM | POA: Diagnosis not present

## 2020-12-22 DIAGNOSIS — U071 COVID-19: Secondary | ICD-10-CM | POA: Diagnosis not present

## 2020-12-22 DIAGNOSIS — I509 Heart failure, unspecified: Secondary | ICD-10-CM | POA: Diagnosis not present

## 2020-12-23 DIAGNOSIS — H35371 Puckering of macula, right eye: Secondary | ICD-10-CM | POA: Diagnosis not present

## 2020-12-23 DIAGNOSIS — H353111 Nonexudative age-related macular degeneration, right eye, early dry stage: Secondary | ICD-10-CM | POA: Diagnosis not present

## 2020-12-23 DIAGNOSIS — E113293 Type 2 diabetes mellitus with mild nonproliferative diabetic retinopathy without macular edema, bilateral: Secondary | ICD-10-CM | POA: Diagnosis not present

## 2020-12-23 DIAGNOSIS — H353221 Exudative age-related macular degeneration, left eye, with active choroidal neovascularization: Secondary | ICD-10-CM | POA: Diagnosis not present

## 2020-12-24 DIAGNOSIS — Z794 Long term (current) use of insulin: Secondary | ICD-10-CM | POA: Diagnosis not present

## 2020-12-24 DIAGNOSIS — U071 COVID-19: Secondary | ICD-10-CM | POA: Diagnosis not present

## 2020-12-24 DIAGNOSIS — Z9181 History of falling: Secondary | ICD-10-CM | POA: Diagnosis not present

## 2020-12-24 DIAGNOSIS — Z7902 Long term (current) use of antithrombotics/antiplatelets: Secondary | ICD-10-CM | POA: Diagnosis not present

## 2020-12-24 DIAGNOSIS — I110 Hypertensive heart disease with heart failure: Secondary | ICD-10-CM | POA: Diagnosis not present

## 2020-12-24 DIAGNOSIS — Z792 Long term (current) use of antibiotics: Secondary | ICD-10-CM | POA: Diagnosis not present

## 2020-12-24 DIAGNOSIS — E119 Type 2 diabetes mellitus without complications: Secondary | ICD-10-CM | POA: Diagnosis not present

## 2020-12-24 DIAGNOSIS — I509 Heart failure, unspecified: Secondary | ICD-10-CM | POA: Diagnosis not present

## 2020-12-30 DIAGNOSIS — U071 COVID-19: Secondary | ICD-10-CM | POA: Diagnosis not present

## 2020-12-30 DIAGNOSIS — Z794 Long term (current) use of insulin: Secondary | ICD-10-CM | POA: Diagnosis not present

## 2020-12-30 DIAGNOSIS — I509 Heart failure, unspecified: Secondary | ICD-10-CM | POA: Diagnosis not present

## 2020-12-30 DIAGNOSIS — E119 Type 2 diabetes mellitus without complications: Secondary | ICD-10-CM | POA: Diagnosis not present

## 2020-12-30 DIAGNOSIS — Z792 Long term (current) use of antibiotics: Secondary | ICD-10-CM | POA: Diagnosis not present

## 2020-12-30 DIAGNOSIS — Z9181 History of falling: Secondary | ICD-10-CM | POA: Diagnosis not present

## 2020-12-30 DIAGNOSIS — I110 Hypertensive heart disease with heart failure: Secondary | ICD-10-CM | POA: Diagnosis not present

## 2020-12-30 DIAGNOSIS — Z7902 Long term (current) use of antithrombotics/antiplatelets: Secondary | ICD-10-CM | POA: Diagnosis not present

## 2020-12-31 DIAGNOSIS — Z794 Long term (current) use of insulin: Secondary | ICD-10-CM | POA: Diagnosis not present

## 2020-12-31 DIAGNOSIS — I110 Hypertensive heart disease with heart failure: Secondary | ICD-10-CM | POA: Diagnosis not present

## 2020-12-31 DIAGNOSIS — R609 Edema, unspecified: Secondary | ICD-10-CM | POA: Diagnosis not present

## 2020-12-31 DIAGNOSIS — Z7902 Long term (current) use of antithrombotics/antiplatelets: Secondary | ICD-10-CM | POA: Diagnosis not present

## 2020-12-31 DIAGNOSIS — U071 COVID-19: Secondary | ICD-10-CM | POA: Diagnosis not present

## 2020-12-31 DIAGNOSIS — I509 Heart failure, unspecified: Secondary | ICD-10-CM | POA: Diagnosis not present

## 2020-12-31 DIAGNOSIS — I1310 Hypertensive heart and chronic kidney disease without heart failure, with stage 1 through stage 4 chronic kidney disease, or unspecified chronic kidney disease: Secondary | ICD-10-CM | POA: Diagnosis not present

## 2020-12-31 DIAGNOSIS — R42 Dizziness and giddiness: Secondary | ICD-10-CM | POA: Diagnosis not present

## 2020-12-31 DIAGNOSIS — Z792 Long term (current) use of antibiotics: Secondary | ICD-10-CM | POA: Diagnosis not present

## 2020-12-31 DIAGNOSIS — E1169 Type 2 diabetes mellitus with other specified complication: Secondary | ICD-10-CM | POA: Diagnosis not present

## 2020-12-31 DIAGNOSIS — Z9181 History of falling: Secondary | ICD-10-CM | POA: Diagnosis not present

## 2020-12-31 DIAGNOSIS — E119 Type 2 diabetes mellitus without complications: Secondary | ICD-10-CM | POA: Diagnosis not present

## 2020-12-31 DIAGNOSIS — N1832 Chronic kidney disease, stage 3b: Secondary | ICD-10-CM | POA: Diagnosis not present

## 2020-12-31 DIAGNOSIS — Z0001 Encounter for general adult medical examination with abnormal findings: Secondary | ICD-10-CM | POA: Diagnosis not present

## 2021-01-01 DIAGNOSIS — I1310 Hypertensive heart and chronic kidney disease without heart failure, with stage 1 through stage 4 chronic kidney disease, or unspecified chronic kidney disease: Secondary | ICD-10-CM | POA: Diagnosis not present

## 2021-01-01 DIAGNOSIS — I10 Essential (primary) hypertension: Secondary | ICD-10-CM | POA: Diagnosis not present

## 2021-01-01 DIAGNOSIS — I63543 Cerebral infarction due to unspecified occlusion or stenosis of bilateral cerebellar arteries: Secondary | ICD-10-CM | POA: Diagnosis not present

## 2021-01-01 DIAGNOSIS — E559 Vitamin D deficiency, unspecified: Secondary | ICD-10-CM | POA: Diagnosis not present

## 2021-01-01 DIAGNOSIS — E1169 Type 2 diabetes mellitus with other specified complication: Secondary | ICD-10-CM | POA: Diagnosis not present

## 2021-01-06 DIAGNOSIS — Z7902 Long term (current) use of antithrombotics/antiplatelets: Secondary | ICD-10-CM | POA: Diagnosis not present

## 2021-01-06 DIAGNOSIS — Z794 Long term (current) use of insulin: Secondary | ICD-10-CM | POA: Diagnosis not present

## 2021-01-06 DIAGNOSIS — E119 Type 2 diabetes mellitus without complications: Secondary | ICD-10-CM | POA: Diagnosis not present

## 2021-01-06 DIAGNOSIS — Z9181 History of falling: Secondary | ICD-10-CM | POA: Diagnosis not present

## 2021-01-06 DIAGNOSIS — I509 Heart failure, unspecified: Secondary | ICD-10-CM | POA: Diagnosis not present

## 2021-01-06 DIAGNOSIS — U071 COVID-19: Secondary | ICD-10-CM | POA: Diagnosis not present

## 2021-01-06 DIAGNOSIS — I110 Hypertensive heart disease with heart failure: Secondary | ICD-10-CM | POA: Diagnosis not present

## 2021-01-06 DIAGNOSIS — Z792 Long term (current) use of antibiotics: Secondary | ICD-10-CM | POA: Diagnosis not present

## 2021-01-13 DIAGNOSIS — Z792 Long term (current) use of antibiotics: Secondary | ICD-10-CM | POA: Diagnosis not present

## 2021-01-13 DIAGNOSIS — I509 Heart failure, unspecified: Secondary | ICD-10-CM | POA: Diagnosis not present

## 2021-01-13 DIAGNOSIS — I110 Hypertensive heart disease with heart failure: Secondary | ICD-10-CM | POA: Diagnosis not present

## 2021-01-13 DIAGNOSIS — Z9181 History of falling: Secondary | ICD-10-CM | POA: Diagnosis not present

## 2021-01-13 DIAGNOSIS — Z794 Long term (current) use of insulin: Secondary | ICD-10-CM | POA: Diagnosis not present

## 2021-01-13 DIAGNOSIS — Z7902 Long term (current) use of antithrombotics/antiplatelets: Secondary | ICD-10-CM | POA: Diagnosis not present

## 2021-01-13 DIAGNOSIS — U071 COVID-19: Secondary | ICD-10-CM | POA: Diagnosis not present

## 2021-01-13 DIAGNOSIS — E119 Type 2 diabetes mellitus without complications: Secondary | ICD-10-CM | POA: Diagnosis not present

## 2021-01-14 DIAGNOSIS — E119 Type 2 diabetes mellitus without complications: Secondary | ICD-10-CM | POA: Diagnosis not present

## 2021-01-14 DIAGNOSIS — Z9181 History of falling: Secondary | ICD-10-CM | POA: Diagnosis not present

## 2021-01-14 DIAGNOSIS — U071 COVID-19: Secondary | ICD-10-CM | POA: Diagnosis not present

## 2021-01-14 DIAGNOSIS — Z794 Long term (current) use of insulin: Secondary | ICD-10-CM | POA: Diagnosis not present

## 2021-01-14 DIAGNOSIS — I110 Hypertensive heart disease with heart failure: Secondary | ICD-10-CM | POA: Diagnosis not present

## 2021-01-14 DIAGNOSIS — Z792 Long term (current) use of antibiotics: Secondary | ICD-10-CM | POA: Diagnosis not present

## 2021-01-14 DIAGNOSIS — I509 Heart failure, unspecified: Secondary | ICD-10-CM | POA: Diagnosis not present

## 2021-01-14 DIAGNOSIS — Z7902 Long term (current) use of antithrombotics/antiplatelets: Secondary | ICD-10-CM | POA: Diagnosis not present

## 2021-01-15 ENCOUNTER — Encounter: Payer: Self-pay | Admitting: Podiatry

## 2021-01-15 ENCOUNTER — Ambulatory Visit: Payer: Medicare Other | Admitting: Podiatry

## 2021-01-15 ENCOUNTER — Other Ambulatory Visit: Payer: Self-pay

## 2021-01-15 DIAGNOSIS — B351 Tinea unguium: Secondary | ICD-10-CM | POA: Diagnosis not present

## 2021-01-15 DIAGNOSIS — E11620 Type 2 diabetes mellitus with diabetic dermatitis: Secondary | ICD-10-CM | POA: Insufficient documentation

## 2021-01-15 DIAGNOSIS — M79674 Pain in right toe(s): Secondary | ICD-10-CM | POA: Diagnosis not present

## 2021-01-15 DIAGNOSIS — M79675 Pain in left toe(s): Secondary | ICD-10-CM

## 2021-01-15 DIAGNOSIS — E1159 Type 2 diabetes mellitus with other circulatory complications: Secondary | ICD-10-CM | POA: Insufficient documentation

## 2021-01-15 NOTE — Progress Notes (Signed)
This patient returns to my office for at risk foot care.  This patient requires this care by a professional since this patient will be at risk due to having CKD and diabetes.  This patient is unable to cut nails herself since the patient cannot reach hiernails.These nails are painful walking and wearing shoes.  This patient presents for at risk foot care today.  General Appearance  Alert, conversant and in no acute stress.  Vascular  Dorsalis pedis and posterior tibial  pulses are  weakly palpable  bilaterally.  Capillary return is within normal limits  bilaterally. Cold feet.    bilaterally. Absent digital hair  B/L.  Neurologic  Senn-Weinstein monofilament wire test within normal limits  bilaterally. Muscle power within normal limits bilaterally.  Nails Thick disfigured discolored nails with subungual debris  from hallux to fifth toes bilaterally. No evidence of bacterial infection or drainage bilaterally.  Orthopedic  No limitations of motion  feet .  No crepitus or effusions noted.  No bony pathology or digital deformities noted. 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  Skin  normotropic skin with no porokeratosis noted bilaterally.  No signs of infections or ulcers noted.     Onychomycosis  Pain in right toes  Pain in left toes  Consent was obtained for treatment procedures.   Mechanical debridement of nails 1-5  bilaterally performed with a nail nipper.  Filed with dremel without incident.    Return office visit    prn                Told patient to return for periodic foot care and evaluation due to potential at risk complications.   Helane Gunther DPM

## 2021-01-20 DIAGNOSIS — Z792 Long term (current) use of antibiotics: Secondary | ICD-10-CM | POA: Diagnosis not present

## 2021-01-20 DIAGNOSIS — U071 COVID-19: Secondary | ICD-10-CM | POA: Diagnosis not present

## 2021-01-20 DIAGNOSIS — Z7902 Long term (current) use of antithrombotics/antiplatelets: Secondary | ICD-10-CM | POA: Diagnosis not present

## 2021-01-20 DIAGNOSIS — I509 Heart failure, unspecified: Secondary | ICD-10-CM | POA: Diagnosis not present

## 2021-01-20 DIAGNOSIS — I110 Hypertensive heart disease with heart failure: Secondary | ICD-10-CM | POA: Diagnosis not present

## 2021-01-20 DIAGNOSIS — E119 Type 2 diabetes mellitus without complications: Secondary | ICD-10-CM | POA: Diagnosis not present

## 2021-01-20 DIAGNOSIS — Z9181 History of falling: Secondary | ICD-10-CM | POA: Diagnosis not present

## 2021-01-20 DIAGNOSIS — Z794 Long term (current) use of insulin: Secondary | ICD-10-CM | POA: Diagnosis not present

## 2021-01-27 DIAGNOSIS — U071 COVID-19: Secondary | ICD-10-CM | POA: Diagnosis not present

## 2021-01-27 DIAGNOSIS — Z792 Long term (current) use of antibiotics: Secondary | ICD-10-CM | POA: Diagnosis not present

## 2021-01-27 DIAGNOSIS — Z9181 History of falling: Secondary | ICD-10-CM | POA: Diagnosis not present

## 2021-01-27 DIAGNOSIS — Z794 Long term (current) use of insulin: Secondary | ICD-10-CM | POA: Diagnosis not present

## 2021-01-27 DIAGNOSIS — E119 Type 2 diabetes mellitus without complications: Secondary | ICD-10-CM | POA: Diagnosis not present

## 2021-01-27 DIAGNOSIS — I509 Heart failure, unspecified: Secondary | ICD-10-CM | POA: Diagnosis not present

## 2021-01-27 DIAGNOSIS — Z7902 Long term (current) use of antithrombotics/antiplatelets: Secondary | ICD-10-CM | POA: Diagnosis not present

## 2021-01-27 DIAGNOSIS — I110 Hypertensive heart disease with heart failure: Secondary | ICD-10-CM | POA: Diagnosis not present

## 2021-02-01 ENCOUNTER — Other Ambulatory Visit: Payer: Self-pay | Admitting: *Deleted

## 2021-02-01 ENCOUNTER — Encounter: Payer: Self-pay | Admitting: *Deleted

## 2021-02-01 DIAGNOSIS — I110 Hypertensive heart disease with heart failure: Secondary | ICD-10-CM | POA: Diagnosis not present

## 2021-02-01 DIAGNOSIS — E119 Type 2 diabetes mellitus without complications: Secondary | ICD-10-CM | POA: Diagnosis not present

## 2021-02-01 DIAGNOSIS — Z7902 Long term (current) use of antithrombotics/antiplatelets: Secondary | ICD-10-CM | POA: Diagnosis not present

## 2021-02-01 DIAGNOSIS — Z794 Long term (current) use of insulin: Secondary | ICD-10-CM | POA: Diagnosis not present

## 2021-02-01 DIAGNOSIS — Z792 Long term (current) use of antibiotics: Secondary | ICD-10-CM | POA: Diagnosis not present

## 2021-02-01 DIAGNOSIS — Z9181 History of falling: Secondary | ICD-10-CM | POA: Diagnosis not present

## 2021-02-01 DIAGNOSIS — I509 Heart failure, unspecified: Secondary | ICD-10-CM | POA: Diagnosis not present

## 2021-02-01 DIAGNOSIS — U071 COVID-19: Secondary | ICD-10-CM | POA: Diagnosis not present

## 2021-02-01 NOTE — Patient Outreach (Signed)
[facility]) Care Management  [facility] Care Manager  02/01/2021   [name] [birth year] [id number]  Subjective: Successful telephone outreach call to patient. HIPAA identifiers obtained. Patient states she is dong well at this time. Nurse discussed with patient her health goals and her health and wellness needs which were documented in the Epic system. Patient did not have any further questions or concerns today and did confirm that she has this nurse's contact number to call her if needed.   Encounter Medications:  Outpatient Encounter Medications as of 02/01/2021  Medication Sig Note   acetaminophen (TYLENOL) 500 MG tablet Take 500 mg by mouth every 6 (six) hours as needed for headache (pain).    allopurinol (ZYLOPRIM) 100 MG tablet Take 100 mg by mouth at bedtime.     amLODipine (NORVASC) 5 MG tablet Take 5 mg by mouth 2 (two) times daily.    atorvastatin (LIPITOR) 20 MG tablet TAKE 1 TABLET BY MOUTH  DAILY    azelastine (OPTIVAR) 0.05 % ophthalmic solution Place 1 drop into both eyes 2 (two) times daily.    carvedilol (COREG) 6.25 MG tablet TAKE 1 TABLET BY MOUTH  TWICE DAILY    clopidogrel (PLAVIX) 75 MG tablet One tablet by mouth ( 75 mg) daily.    dapagliflozin propanediol (FARXIGA) 5 MG TABS tablet Take 5 mg by mouth daily.    furosemide (LASIX) 40 MG tablet TAKE 1 TABLET BY MOUTH  MONDAY WEDNESDAY AND FRIDAY WITH POTASSIUM    insulin NPH Human (HUMULIN N,NOVOLIN N) 100 UNIT/ML injection Inject 5-45 Units into the skin See admin instructions. Inject 45 units subcutaneously every morning and 10 units at night    Multiple Vitamin (MULTIVITAMIN WITH MINERALS) TABS tablet Take 1 tablet by mouth at bedtime.     nitroGLYCERIN (NITROSTAT) 0.4 MG SL tablet DISSOLVE ONE TABLET UNDER THE TONGUE EVERY 5 MINUTES AS NEEDED FOR CHEST PAIN.  DO NOT EXCEED A TOTAL OF 3 DOSES IN 15 MINUTES    ONE TOUCH ULTRA TEST test strip 1 each by Other route daily as needed (blood sugar).      oxyCODONE-acetaminophen (PERCOCET/ROXICET) 5-325 MG tablet Take 1 tablet by mouth daily as needed (pain).  (Patient not taking: No sig reported) 02/01/2021: completed   potassium chloride (KLOR-CON) 10 MEQ tablet TAKE 1 TABLET BY MOUTH  MONDAY WEDNESDAY AND FRIDAY WITH LASIX    sacubitril-valsartan (ENTRESTO) 97-103 MG Take 1 tablet by mouth 2 (two) times daily.    tobramycin (TOBREX) 0.3 % ophthalmic solution Place 1 drop into the left eye 4 (four) times daily.    Facility-Administered Encounter Medications as of 02/01/2021  Medication   betamethasone acetate-betamethasone sodium phosphate (CELESTONE) injection 3 mg    Functional Status:  No flowsheet data found.  Fall/Depression Screening: Fall Risk  02/01/2021 10/30/2020 07/29/2020  Falls in the past year? 1 0 1  Comment - - -  Number falls in past yr: 0 0 0  Injury with Fall? 0 0 0  Risk for fall due to : Impaired mobility;Impaired balance/gait;History of fall(s) Impaired balance/gait;Impaired mobility History of fall(s);Impaired balance/gait;Impaired mobility;Impaired vision  Follow up Falls prevention discussed;Education provided;Falls evaluation completed Falls prevention discussed;Education provided;Falls evaluation completed Falls prevention discussed;Education provided;Falls evaluation completed   PHQ 2/9 Scores 02/01/2021 02/24/2020 11/01/2018 10/08/2018  PHQ - 2 Score 0 0 2 2  PHQ- 9 Score - - 2 2    Assessment:   Care Plan There are no care plans that you recently modified to display  for this patient.    Goals Addressed             This Visit's Progress    [facility]) Patient will verbalize continuation of monitoring her blood sugar daily and recording the values for the next 90 days   On track    Timeframe:  Long-Range Goal Priority:  High Start Date:  02/24/20                           Expected End Date: 05/10/21                  Follow Up Date 05/10/21   - check blood sugar at prescribed times - check blood  sugar if I feel it is too high or too low - enter blood sugar readings and medication or insulin into daily log - take the blood sugar log to all doctor visits - take the blood sugar meter to all doctor visits  -Encouraged patient to continue to a diet low in sugar and carbohydrates -Encouraged patient to continue to walk routinely -Encouraged patient to continue to lose weight   Why is this important?   Checking your blood sugar at home helps to keep it from getting very high or very low.  Writing the results in a diary or log helps the doctor know how to care for you.  Your blood sugar log should have the time, date and the results.  Also, write down the amount of insulin or other medicine that you take.  Other information, like what you ate, exercise done and how you were feeling, will also be helpful.     Notes: 02/01/21: Patient states her diabetes is under control. Her blood sugar today was 122 and her last A1c was 6.6 on 01/01/21.  02/24/20: Patient reports checking her blood sugar 2-3 times daily and recording the values.  Updated 07/29/20: Patient continues to take her blood sugar 1-2 times daily. She has continued with her weight loss journey and reports she now weighs 199 pounds. Nurse congratulated the patient on her dedication to her health and wellness.  Updated 10/30/20: Patient reports continuation of taking her blood sugar daily. Patient states that her diabetes is currently under control, her last A1c was 6.8 on 09/24/20, and her A1c goal is 7.     [facility]) Patient will verbalize continuation of weighing herself 2-3 weekly and monitoring for signs of heart failure daily within the next 90 days   On track    Timeframe:  Long-Range Goal Priority:  Medium Start Date: 02/24/20                            Expected End Date: 1-30/23                    Follow Up Date 05/10/21   - call office if I gain more than 2 pounds in one day or 5 pounds in one week - keep legs up while  sitting - track weight in diary - use salt in moderation - watch for swelling in feet, ankles and legs every day -Discussed weighing 2-3 times weekly and recording values in [facility] calendar booklet -Discussed using [facility] calendar resource sheet to monitor for symptoms of heart failure daily -Encouraged patient to limit salt intake -Encouraged patient to continue to walk routinely and lose weight  Why is this important?   It is  important to check your weight daily and watch how much salt and liquids you have.  It will help you to manage your heart failure.    Notes: 02/01/21: Patient reports that her weight is stable and stays between 183-186. Patient continues to eat a low sodium diet, walks routinely short distances, and is working with [facility] PT to increase her strength and mobility.  Updated 10/30/20: Patient states she continues to weigh herself 2-3 times weekly and records her values. She is working with PT 2 times a week and does continue to walk routinely. Patient's  reports her weight  has remained stable currently 197 and that she has maintained her 60 pound weight loss which is her goal. Patient states she continues to make healthier food choices and uses portion control.  Nurse congratulated the patient for maintaining her weight goal.  Updated 07/29/20: Patient states she received [facility] calendar booklet, that she would weigh herself 2-3 times weekly, record the values, and use the color zones and rescue action plans to monitor for symptoms of heart failure. Congratulated patient for her dedication to maintaining a healthy lifestyle and losing approximately 60 pounds.       [facility]) Patient will verbalize monitoring their B/P 2 times weekly within the next 90 days   On track    Timeframe:  Long-Range Goal Priority:  Medium Start Date:  10/30/20                           Expected End Date: 10/08/21                     Follow Up Date 05/10/21    - write blood pressure results in a log or  diary -Encouraged patient to take her B/P 2 times weekly and record the values into her calendar booklet   -Encouraged patient to limit salt in her diet -Encouraged continuation of walking routinely  Why is this important?   You won't feel high blood pressure, but it can still hurt your blood vessels.  High blood pressure can cause heart or kidney problems. It can also cause a stroke.  Making lifestyle changes like losing a little weight or eating less salt will help.  Checking your blood pressure at home and at different times of the day can help to control blood pressure.  If the doctor prescribes medicine remember to take it the way the doctor ordered.  Call the office if you cannot afford the medicine or if there are questions about it.     Notes: 02/01/21: Patient reports taking her B/P daily and recording the values. Her B/P today was 146/65 and she states she is working closely with her PCP to lower her B/P. She continues to eat a low sodium diet, walk in her corridor routinely, and works with [facility] PT.  10/30/20: Patient states that she does not take her B/P routinely. Her last remembered B/P was 156/67. Nurse discussed that this value is high and encouraged patient to take her B/P at home. Patient states she will take her B/P and write the value into her calendar booklet every Tuesday and Thursday. Nurse will send hypertension education.       Plan: RN Health Coach will send PCP a quarterly update and will  call patient within the month of January. Follow-up: Patient agrees to Care Plan and Follow-up.  Blanchie Serve RN, BSN [facility] Care Management  RN Health Coach [phone] [email or web address]

## 2021-02-01 NOTE — Patient Instructions (Signed)
Goals Addressed             This Visit's Progress    [facility]) Patient will verbalize continuation of monitoring her blood sugar daily and recording the values for the next 90 days   On track    Timeframe:  Long-Range Goal Priority:  High Start Date:  02/24/20                           Expected End Date: 05/10/21                  Follow Up Date 05/10/21   - check blood sugar at prescribed times - check blood sugar if I feel it is too high or too low - enter blood sugar readings and medication or insulin into daily log - take the blood sugar log to all doctor visits - take the blood sugar meter to all doctor visits  -Encouraged patient to continue to a diet low in sugar and carbohydrates -Encouraged patient to continue to walk routinely -Encouraged patient to continue to lose weight   Why is this important?   Checking your blood sugar at home helps to keep it from getting very high or very low.  Writing the results in a diary or log helps the doctor know how to care for you.  Your blood sugar log should have the time, date and the results.  Also, write down the amount of insulin or other medicine that you take.  Other information, like what you ate, exercise done and how you were feeling, will also be helpful.     Notes: 02/01/21: Patient states her diabetes is under control. Her blood sugar today was 122 and her last A1c was 6.6 on 01/01/21.  02/24/20: Patient reports checking her blood sugar 2-3 times daily and recording the values.  Updated 07/29/20: Patient continues to take her blood sugar 1-2 times daily. She has continued with her weight loss journey and reports she now weighs 199 pounds. Nurse congratulated the patient on her dedication to her health and wellness.  Updated 10/30/20: Patient reports continuation of taking her blood sugar daily. Patient states that her diabetes is currently under control, her last A1c was 6.8 on 09/24/20, and her A1c goal is 7.     [facility]) Patient  will verbalize continuation of weighing herself 2-3 weekly and monitoring for signs of heart failure daily within the next 90 days   On track    Timeframe:  Long-Range Goal Priority:  Medium Start Date: 02/24/20                            Expected End Date: 1-30/23                    Follow Up Date 05/10/21   - call office if I gain more than 2 pounds in one day or 5 pounds in one week - keep legs up while sitting - track weight in diary - use salt in moderation - watch for swelling in feet, ankles and legs every day -Discussed weighing 2-3 times weekly and recording values in [facility] calendar booklet -Discussed using [facility] calendar resource sheet to monitor for symptoms of heart failure daily -Encouraged patient to limit salt intake -Encouraged patient to continue to walk routinely and lose weight  Why is this important?   It is important to check your weight daily and  watch how much salt and liquids you have.  It will help you to manage your heart failure.    Notes: 02/01/21: Patient reports that her weight is stable and stays between 183-186. Patient continues to eat a low sodium diet, walks routinely short distances, and is working with [facility] PT to increase her strength and mobility.  Updated 10/30/20: Patient states she continues to weigh herself 2-3 times weekly and records her values. She is working with PT 2 times a week and does continue to walk routinely. Patient's  reports her weight  has remained stable currently 197 and that she has maintained her 60 pound weight loss which is her goal. Patient states she continues to make healthier food choices and uses portion control.  Nurse congratulated the patient for maintaining her weight goal.  Updated 07/29/20: Patient states she received [facility] calendar booklet, that she would weigh herself 2-3 times weekly, record the values, and use the color zones and rescue action plans to monitor for symptoms of heart failure. Congratulated patient for her  dedication to maintaining a healthy lifestyle and losing approximately 60 pounds.       [facility]) Patient will verbalize monitoring their B/P 2 times weekly within the next 90 days   On track    Timeframe:  Long-Range Goal Priority:  Medium Start Date:  10/30/20                           Expected End Date: 10/08/21                     Follow Up Date 05/10/21    - write blood pressure results in a log or diary -Encouraged patient to take her B/P 2 times weekly and record the values into her calendar booklet   -Encouraged patient to limit salt in her diet -Encouraged continuation of walking routinely  Why is this important?   You won't feel high blood pressure, but it can still hurt your blood vessels.  High blood pressure can cause heart or kidney problems. It can also cause a stroke.  Making lifestyle changes like losing a little weight or eating less salt will help.  Checking your blood pressure at home and at different times of the day can help to control blood pressure.  If the doctor prescribes medicine remember to take it the way the doctor ordered.  Call the office if you cannot afford the medicine or if there are questions about it.     Notes: 02/01/21: Patient reports taking her B/P daily and recording the values. Her B/P today was 146/65 and she states she is working closely with her PCP to lower her B/P. She continues to eat a low sodium diet, walk in her corridor routinely, and works with [facility] PT.  10/30/20: Patient states that she does not take her B/P routinely. Her last remembered B/P was 156/67. Nurse discussed that this value is high and encouraged patient to take her B/P at home. Patient states she will take her B/P and write the value into her calendar booklet every Tuesday and Thursday. Nurse will send hypertension education.

## 2021-02-08 DIAGNOSIS — E1122 Type 2 diabetes mellitus with diabetic chronic kidney disease: Secondary | ICD-10-CM | POA: Diagnosis not present

## 2021-02-08 DIAGNOSIS — I130 Hypertensive heart and chronic kidney disease with heart failure and stage 1 through stage 4 chronic kidney disease, or unspecified chronic kidney disease: Secondary | ICD-10-CM | POA: Diagnosis not present

## 2021-02-08 DIAGNOSIS — N1831 Chronic kidney disease, stage 3a: Secondary | ICD-10-CM | POA: Diagnosis not present

## 2021-02-08 DIAGNOSIS — I5032 Chronic diastolic (congestive) heart failure: Secondary | ICD-10-CM | POA: Diagnosis not present

## 2021-02-12 DIAGNOSIS — Z9181 History of falling: Secondary | ICD-10-CM | POA: Diagnosis not present

## 2021-02-12 DIAGNOSIS — M1990 Unspecified osteoarthritis, unspecified site: Secondary | ICD-10-CM | POA: Diagnosis not present

## 2021-02-12 DIAGNOSIS — E119 Type 2 diabetes mellitus without complications: Secondary | ICD-10-CM | POA: Diagnosis not present

## 2021-02-12 DIAGNOSIS — E785 Hyperlipidemia, unspecified: Secondary | ICD-10-CM | POA: Diagnosis not present

## 2021-02-12 DIAGNOSIS — I110 Hypertensive heart disease with heart failure: Secondary | ICD-10-CM | POA: Diagnosis not present

## 2021-02-12 DIAGNOSIS — Z8616 Personal history of COVID-19: Secondary | ICD-10-CM | POA: Diagnosis not present

## 2021-02-12 DIAGNOSIS — K219 Gastro-esophageal reflux disease without esophagitis: Secondary | ICD-10-CM | POA: Diagnosis not present

## 2021-02-12 DIAGNOSIS — I509 Heart failure, unspecified: Secondary | ICD-10-CM | POA: Diagnosis not present

## 2021-02-12 DIAGNOSIS — Z7901 Long term (current) use of anticoagulants: Secondary | ICD-10-CM | POA: Diagnosis not present

## 2021-02-12 DIAGNOSIS — Z794 Long term (current) use of insulin: Secondary | ICD-10-CM | POA: Diagnosis not present

## 2021-02-15 ENCOUNTER — Other Ambulatory Visit: Payer: Self-pay

## 2021-02-15 MED ORDER — SACUBITRIL-VALSARTAN 97-103 MG PO TABS: 1.0000 | ORAL_TABLET | Freq: Two times a day (BID) | ORAL | 1 refills | Status: DC

## 2021-02-17 ENCOUNTER — Other Ambulatory Visit: Payer: Self-pay | Admitting: Internal Medicine

## 2021-02-18 DIAGNOSIS — Z23 Encounter for immunization: Secondary | ICD-10-CM | POA: Diagnosis not present

## 2021-02-18 DIAGNOSIS — I1310 Hypertensive heart and chronic kidney disease without heart failure, with stage 1 through stage 4 chronic kidney disease, or unspecified chronic kidney disease: Secondary | ICD-10-CM | POA: Diagnosis not present

## 2021-02-18 DIAGNOSIS — N1831 Chronic kidney disease, stage 3a: Secondary | ICD-10-CM | POA: Diagnosis not present

## 2021-02-18 DIAGNOSIS — E1169 Type 2 diabetes mellitus with other specified complication: Secondary | ICD-10-CM | POA: Diagnosis not present

## 2021-02-22 DIAGNOSIS — I110 Hypertensive heart disease with heart failure: Secondary | ICD-10-CM | POA: Diagnosis not present

## 2021-02-22 DIAGNOSIS — E785 Hyperlipidemia, unspecified: Secondary | ICD-10-CM | POA: Diagnosis not present

## 2021-02-22 DIAGNOSIS — Z7901 Long term (current) use of anticoagulants: Secondary | ICD-10-CM | POA: Diagnosis not present

## 2021-02-22 DIAGNOSIS — K219 Gastro-esophageal reflux disease without esophagitis: Secondary | ICD-10-CM | POA: Diagnosis not present

## 2021-02-22 DIAGNOSIS — Z794 Long term (current) use of insulin: Secondary | ICD-10-CM | POA: Diagnosis not present

## 2021-02-22 DIAGNOSIS — I509 Heart failure, unspecified: Secondary | ICD-10-CM | POA: Diagnosis not present

## 2021-02-22 DIAGNOSIS — E119 Type 2 diabetes mellitus without complications: Secondary | ICD-10-CM | POA: Diagnosis not present

## 2021-02-22 DIAGNOSIS — Z9181 History of falling: Secondary | ICD-10-CM | POA: Diagnosis not present

## 2021-02-22 DIAGNOSIS — M1990 Unspecified osteoarthritis, unspecified site: Secondary | ICD-10-CM | POA: Diagnosis not present

## 2021-02-22 DIAGNOSIS — Z8616 Personal history of COVID-19: Secondary | ICD-10-CM | POA: Diagnosis not present

## 2021-03-02 DIAGNOSIS — Z9181 History of falling: Secondary | ICD-10-CM | POA: Diagnosis not present

## 2021-03-02 DIAGNOSIS — Z794 Long term (current) use of insulin: Secondary | ICD-10-CM | POA: Diagnosis not present

## 2021-03-02 DIAGNOSIS — M1990 Unspecified osteoarthritis, unspecified site: Secondary | ICD-10-CM | POA: Diagnosis not present

## 2021-03-02 DIAGNOSIS — E785 Hyperlipidemia, unspecified: Secondary | ICD-10-CM | POA: Diagnosis not present

## 2021-03-02 DIAGNOSIS — Z8616 Personal history of COVID-19: Secondary | ICD-10-CM | POA: Diagnosis not present

## 2021-03-02 DIAGNOSIS — E119 Type 2 diabetes mellitus without complications: Secondary | ICD-10-CM | POA: Diagnosis not present

## 2021-03-02 DIAGNOSIS — I509 Heart failure, unspecified: Secondary | ICD-10-CM | POA: Diagnosis not present

## 2021-03-02 DIAGNOSIS — Z7901 Long term (current) use of anticoagulants: Secondary | ICD-10-CM | POA: Diagnosis not present

## 2021-03-02 DIAGNOSIS — K219 Gastro-esophageal reflux disease without esophagitis: Secondary | ICD-10-CM | POA: Diagnosis not present

## 2021-03-02 DIAGNOSIS — I110 Hypertensive heart disease with heart failure: Secondary | ICD-10-CM | POA: Diagnosis not present

## 2021-03-12 DIAGNOSIS — Z8616 Personal history of COVID-19: Secondary | ICD-10-CM | POA: Diagnosis not present

## 2021-03-12 DIAGNOSIS — I509 Heart failure, unspecified: Secondary | ICD-10-CM | POA: Diagnosis not present

## 2021-03-12 DIAGNOSIS — M1990 Unspecified osteoarthritis, unspecified site: Secondary | ICD-10-CM | POA: Diagnosis not present

## 2021-03-12 DIAGNOSIS — Z7901 Long term (current) use of anticoagulants: Secondary | ICD-10-CM | POA: Diagnosis not present

## 2021-03-12 DIAGNOSIS — K219 Gastro-esophageal reflux disease without esophagitis: Secondary | ICD-10-CM | POA: Diagnosis not present

## 2021-03-12 DIAGNOSIS — E785 Hyperlipidemia, unspecified: Secondary | ICD-10-CM | POA: Diagnosis not present

## 2021-03-12 DIAGNOSIS — E119 Type 2 diabetes mellitus without complications: Secondary | ICD-10-CM | POA: Diagnosis not present

## 2021-03-12 DIAGNOSIS — I110 Hypertensive heart disease with heart failure: Secondary | ICD-10-CM | POA: Diagnosis not present

## 2021-03-12 DIAGNOSIS — Z794 Long term (current) use of insulin: Secondary | ICD-10-CM | POA: Diagnosis not present

## 2021-03-12 DIAGNOSIS — Z9181 History of falling: Secondary | ICD-10-CM | POA: Diagnosis not present

## 2021-03-14 ENCOUNTER — Other Ambulatory Visit: Payer: Self-pay | Admitting: Internal Medicine

## 2021-03-16 ENCOUNTER — Other Ambulatory Visit (HOSPITAL_BASED_OUTPATIENT_CLINIC_OR_DEPARTMENT_OTHER): Payer: Self-pay

## 2021-03-16 ENCOUNTER — Ambulatory Visit: Payer: Medicare Other | Attending: Internal Medicine

## 2021-03-16 DIAGNOSIS — Z23 Encounter for immunization: Secondary | ICD-10-CM

## 2021-03-16 MED ORDER — PFIZER COVID-19 VAC BIVALENT 30 MCG/0.3ML IM SUSP
INTRAMUSCULAR | 0 refills | Status: DC
  Filled 2021-03-16: qty 0.3, 1d supply, fill #0

## 2021-03-16 NOTE — Progress Notes (Signed)
   Covid-19 Vaccination Clinic  Name:  [name]    MRN: [id number] DOB: 1932-06-21  03/16/2021  [name] was observed post Covid-19 immunization for 15 minutes without incident. She was provided with Vaccine Information Sheet and instruction to access the V-Safe system.   [name] was instructed to call 911 with any severe reactions post vaccine: Difficulty breathing  Swelling of face and throat  A fast heartbeat  A bad rash all over body  Dizziness and weakness   Immunizations Administered     Name Date Dose VIS Date Route   Pfizer Covid-19 Vaccine Bivalent Booster 03/16/2021 11:02 AM 0.3 mL 12/09/2020 Intramuscular   Manufacturer: [employer], [employer]   Lot: [id number]   NDC: [phone]

## 2021-03-23 DIAGNOSIS — E119 Type 2 diabetes mellitus without complications: Secondary | ICD-10-CM | POA: Diagnosis not present

## 2021-03-23 DIAGNOSIS — Z7901 Long term (current) use of anticoagulants: Secondary | ICD-10-CM | POA: Diagnosis not present

## 2021-03-23 DIAGNOSIS — Z9181 History of falling: Secondary | ICD-10-CM | POA: Diagnosis not present

## 2021-03-23 DIAGNOSIS — E785 Hyperlipidemia, unspecified: Secondary | ICD-10-CM | POA: Diagnosis not present

## 2021-03-23 DIAGNOSIS — Z8616 Personal history of COVID-19: Secondary | ICD-10-CM | POA: Diagnosis not present

## 2021-03-23 DIAGNOSIS — K219 Gastro-esophageal reflux disease without esophagitis: Secondary | ICD-10-CM | POA: Diagnosis not present

## 2021-03-23 DIAGNOSIS — I509 Heart failure, unspecified: Secondary | ICD-10-CM | POA: Diagnosis not present

## 2021-03-23 DIAGNOSIS — Z794 Long term (current) use of insulin: Secondary | ICD-10-CM | POA: Diagnosis not present

## 2021-03-23 DIAGNOSIS — M1990 Unspecified osteoarthritis, unspecified site: Secondary | ICD-10-CM | POA: Diagnosis not present

## 2021-03-23 DIAGNOSIS — I110 Hypertensive heart disease with heart failure: Secondary | ICD-10-CM | POA: Diagnosis not present

## 2021-03-31 DIAGNOSIS — H35371 Puckering of macula, right eye: Secondary | ICD-10-CM | POA: Diagnosis not present

## 2021-03-31 DIAGNOSIS — H353221 Exudative age-related macular degeneration, left eye, with active choroidal neovascularization: Secondary | ICD-10-CM | POA: Diagnosis not present

## 2021-03-31 DIAGNOSIS — H353111 Nonexudative age-related macular degeneration, right eye, early dry stage: Secondary | ICD-10-CM | POA: Diagnosis not present

## 2021-03-31 DIAGNOSIS — E113293 Type 2 diabetes mellitus with mild nonproliferative diabetic retinopathy without macular edema, bilateral: Secondary | ICD-10-CM | POA: Diagnosis not present

## 2021-03-31 DIAGNOSIS — H43813 Vitreous degeneration, bilateral: Secondary | ICD-10-CM | POA: Diagnosis not present

## 2021-04-03 ENCOUNTER — Other Ambulatory Visit: Payer: Self-pay | Admitting: Cardiovascular Disease

## 2021-04-06 ENCOUNTER — Ambulatory Visit: Payer: Medicare Other | Admitting: Internal Medicine

## 2021-04-06 ENCOUNTER — Other Ambulatory Visit: Payer: Self-pay

## 2021-04-06 ENCOUNTER — Encounter: Payer: Self-pay | Admitting: Internal Medicine

## 2021-04-06 VITALS — BP 168/74 | HR 66 | Ht 67.0 in | Wt 202.6 lb

## 2021-04-06 DIAGNOSIS — I495 Sick sinus syndrome: Secondary | ICD-10-CM

## 2021-04-06 DIAGNOSIS — I10 Essential (primary) hypertension: Secondary | ICD-10-CM | POA: Diagnosis not present

## 2021-04-06 DIAGNOSIS — I2510 Atherosclerotic heart disease of native coronary artery without angina pectoris: Secondary | ICD-10-CM

## 2021-04-06 LAB — CUP PACEART INCLINIC DEVICE CHECK
Battery Impedance: 1906 Ohm
Battery Remaining Longevity: 32 mo
Battery Voltage: 2.75 V
Brady Statistic AP VP Percent: 90 %
Brady Statistic AP VS Percent: 1 %
Brady Statistic AS VP Percent: 8 %
Brady Statistic AS VS Percent: 1 %
Date Time Interrogation Session: 20221227171823
Implantable Lead Implant Date: 20131126
Implantable Lead Implant Date: 20131126
Implantable Lead Location: 753859
Implantable Lead Location: 753860
Implantable Lead Model: 5076
Implantable Lead Model: 5092
Implantable Pulse Generator Implant Date: 20131126
Lead Channel Impedance Value: 451 Ohm
Lead Channel Impedance Value: 468 Ohm
Lead Channel Pacing Threshold Amplitude: 0.625 V
Lead Channel Pacing Threshold Amplitude: 0.75 V
Lead Channel Pacing Threshold Amplitude: 1 V
Lead Channel Pacing Threshold Amplitude: 1.125 V
Lead Channel Pacing Threshold Pulse Width: 0.4 ms
Lead Channel Pacing Threshold Pulse Width: 0.4 ms
Lead Channel Pacing Threshold Pulse Width: 0.4 ms
Lead Channel Pacing Threshold Pulse Width: 0.4 ms
Lead Channel Sensing Intrinsic Amplitude: 1.4 mV
Lead Channel Sensing Intrinsic Amplitude: 2.8 mV
Lead Channel Setting Pacing Amplitude: 2 V
Lead Channel Setting Pacing Amplitude: 2.5 V
Lead Channel Setting Pacing Pulse Width: 0.4 ms
Lead Channel Setting Sensing Sensitivity: 2 mV

## 2021-04-06 MED ORDER — CARVEDILOL 12.5 MG PO TABS: 12.5000 mg | ORAL_TABLET | Freq: Two times a day (BID) | ORAL | 3 refills | Status: DC

## 2021-04-06 NOTE — Progress Notes (Signed)
PCP: Lucianne Lei, MD  Primary Cardiology:  [facility] Knack Primary EP:  [name] is a 87 y.o. female who presents today for routine electrophysiology followup.  Since last being seen in our clinic, the patient reports doing very well.  Today, she denies symptoms of palpitations, chest pain, shortness of breath,  lower extremity edema, dizziness, presyncope, or syncope.  The patient is otherwise without complaint today.   Past Medical History:  Diagnosis Date   Allergic rhinitis    Anemia    Anxiety    Barrett esophagus    CAD (coronary artery disease) 2009   a. Multivessel s/p PCI w/DES 2009 // b. s/p CABG 2011  //  c. LHC 8/15: pLAD 95 ISR, LCx 100, pOM1 40, dRCA 100, S-OM1/OM2 ok, S-D1 ok, S-PDA ok, L-LAD ok, EF 60%   Carotid artery disease ([place])    a. Carotid US 3/33: RICA 5-45%; LICA 62-56% >> FU 1 year  //  b. Carotid US 9/17: R 1-39%, L 40-59% >> FU 1 year   Chronic diastolic heart failure (HCC)    CKD (chronic kidney disease), stage II    GFR 60-89 ml/min   Depression    Disc disease, degenerative, cervical    Diverticulosis    Gastroparesis    GERD (gastroesophageal reflux disease)    Gout    H/O hiatal hernia    Helicobacter pylori gastritis    History of echocardiogram    a. Echo 11/13: EF 55% to 60%. Grade 2 diastolic dysfunction, MAC, trivial MR, mild LAE, normal RVSF, mild RAE, PASP 39 mmHg  //  b. Echo 4/17: EF 55-60%, normal wall motion, trivial AI, MAC, moderate LAE, mild RVE, PASP 35 mmHg   History of thrombocytopenia    HTN (hypertension)    Hyperlipidemia    Hypothyroidism    LBP (low back pain)    Lumbar disc disease/lumbar spinal stenosis   Morbid obesity (HCC)    Myocardial infarction ([place])    Osteoarthritis    Osteopenia    PVD (peripheral vascular disease) ([place])    Sick sinus syndrome ([place])    MDT Dual-chamber PPM implant 02/2012   Type II or unspecified type diabetes mellitus without mention of complication, not stated as uncontrolled     Past Surgical History:  Procedure Laterality Date   ABDOMINAL HYSTERECTOMY     CARDIAC CATHETERIZATION     2011  Samantha COOPER (APPT NEXT WEEK)   CHOLECYSTECTOMY     CORONARY ARTERY BYPASS GRAFT  2011   LIMA-LAD, SVG-DIAG, SVG-OM1-OM2, SVG-PDA   CORONARY STENT PLACEMENT     Drug-eluting stent to the left anterior descending, circumflex and right coronary artery in Jan 2009   EYE SURGERY     BIL CATARACT REMOVAL 06/2010   LEFT HEART CATHETERIZATION WITH CORONARY ANGIOGRAM N/A 12/02/2013   Procedure: LEFT HEART CATHETERIZATION WITH CORONARY ANGIOGRAM;  Surgeon: Sinclair Grooms, MD;  Location[facility] C[facility] LAB;  Service: Cardiovascular;  Laterality: N/A;   OVARIAN CYST REMOVAL     PACEMAKER INSERTION  03/06/12   MDT Adapta L implanted by Samantha Rayann Heman for SSS   PERMANENT PACEMAKER INSERTION N/A 03/06/2012   Procedure: PERMANENT PACEMAKER INSERTION;  Surgeon: Thompson Grayer, MD;  Location: [facility] CATH LAB;  Service: Cardiovascular;  Laterality: N/A;   SHOULDER ARTHROSCOPY  06/16/2011   Procedure: ARTHROSCOPY SHOULDER;  Surgeon: Sharmon Revere, MD;  Location: [place];  Service: Orthopedics;  Laterality: Left;  LEFT SHOULDER ARTHROSCOPY ACROMIALPLASTY, POSSIBLE MINI  OPEN CUFF REPAIR    TUBAL LIGATION      ROS- all systems are reviewed and negative except as per HPI above  Current Outpatient Medications  Medication Sig Dispense Refill   acetaminophen (TYLENOL) 500 MG tablet Take 500 mg by mouth every 6 (six) hours as needed for headache (pain).     allopurinol (ZYLOPRIM) 100 MG tablet Take 100 mg by mouth at bedtime.      amLODipine (NORVASC) 5 MG tablet Take 5 mg by mouth 2 (two) times daily.     atorvastatin (LIPITOR) 20 MG tablet TAKE 1 TABLET BY MOUTH  DAILY 90 tablet 2   azelastine (OPTIVAR) 0.05 % ophthalmic solution Place 1 drop into both eyes 2 (two) times daily.     carvedilol (COREG) 6.25 MG tablet Take 1 tablet (6.25 mg total) by mouth 2 (two) times daily with a meal. Please keep upcoming appt  with Samantha. Rayann Heman in December 2022 before anymore refills. Thank you 60 tablet 0   clopidogrel (PLAVIX) 75 MG tablet One tablet by mouth ( 75 mg) daily. 90 tablet 3   COVID-19 mRNA bivalent vaccine, Pfizer, (PFIZER COVID-19 VAC BIVALENT) injection Inject into the muscle. 0.3 mL 0   dapagliflozin propanediol (FARXIGA) 5 MG TABS tablet Take 5 mg by mouth daily.     furosemide (LASIX) 40 MG tablet TAKE 1 TABLET BY MOUTH  MONDAY WEDNESDAY AND FRIDAY WITH POTASSIUM 15 tablet 9   insulin NPH Human (HUMULIN N,NOVOLIN N) 100 UNIT/ML injection Inject 5-45 Units into the skin See admin instructions. Inject 45 units subcutaneously every morning and 10 units at night     Multiple Vitamin (MULTIVITAMIN WITH MINERALS) TABS tablet Take 1 tablet by mouth at bedtime.      nitroGLYCERIN (NITROSTAT) 0.4 MG SL tablet DISSOLVE ONE TABLET UNDER THE TONGUE EVERY 5 MINUTES AS NEEDED FOR CHEST PAIN.  DO NOT EXCEED A TOTAL OF 3 DOSES IN 15 MINUTES 25 tablet 3   ONE TOUCH ULTRA TEST test strip 1 each by Other route daily as needed (blood sugar).      oxyCODONE-acetaminophen (PERCOCET/ROXICET) 5-325 MG tablet Take 1 tablet by mouth daily as needed (pain).     potassium chloride (KLOR-CON M) 10 MEQ tablet TAKE 1 TABLET BY MOUTH  MONDAY WEDNESDAY AND FRIDAY WITH LASIX 12 tablet 0   sacubitril-valsartan (ENTRESTO) 97-103 MG Take 1 tablet by mouth 2 (two) times daily. 180 tablet 1   tobramycin (TOBREX) 0.3 % ophthalmic solution Place 1 drop into the left eye 4 (four) times daily.     Current Facility-Administered Medications  Medication Dose Route Frequency Provider Last Rate Last Admin   betamethasone acetate-betamethasone sodium phosphate (CELESTONE) injection 3 mg  3 mg Intramuscular Once Edrick Kins, DPM        Physical Exam: Vitals:   04/06/21 1639  BP: (!) 168/74  Pulse: 66  SpO2: 97%  Weight: 202 lb 9.6 oz (91.9 kg)  Height: _0  (1.702 m)    GEN- The patient is elderly appearing, alert and oriented x 3 today.    Head- normocephalic, atraumatic Eyes-  Sclera clear, conjunctiva pink Ears- hearing intact Oropharynx- clear Lungs-  normal work of breathing Chest- pacemaker pocket is well healed Heart- Regular rate and rhythm  GI- soft, NT, ND, + BS Extremities- no clubbing, cyanosis, or edema  Pacemaker interrogation- reviewed in detail today,  See PACEART report  ekg tracing ordered today is personally reviewed and shows AV paced  Assessment and Plan:  1.  Symptomatic sinus bradycardia and second degree AV block Normal pacemaker function See Pace Art report She is > 80% A and V paced I have therefore turned MVP off and fixed outputs today. she is not device dependant today  2. HTN Stable No change required today  3. CAD No ischemic symptoms No changes  4. Obesity Body mass index is 31.73 kg/m. Lifestyle modification advised  Return to see EP APP annually  Thompson Grayer MD, [facility] 04/06/2021 4:40 PM

## 2021-04-06 NOTE — Patient Instructions (Addendum)
Medication Instructions:  Increase Carvedilol to 12.5 mg two times a day  Your physician recommends that you continue on your current medications as directed. Please refer to the Current Medication list given to you today. *If you need a refill on your cardiac medications before your next appointment, please call your pharmacy*  Lab Work: None. If you have labs (blood work) drawn today and your tests are completely normal, you will receive your results only by: MyChart Message (if you have MyChart) OR A paper copy in the mail If you have any lab test that is abnormal or we need to change your treatment, we will call you to review the results.  Testing/Procedures: None.  Follow-Up: At [facility], you and your health needs are our priority.  As part of our continuing mission to provide you with exceptional heart care, we have created designated Provider Care Teams.  These Care Teams include your primary Cardiologist (physician) and Advanced Practice Providers (APPs -  Physician Assistants and Nurse Practitioners) who all work together to provide you with the care you need, when you need it.  Your physician wants you to follow-up in: 12 months with  one of the following Advanced Practice Providers on your designated Care Team:    Francis Dowse, PA-C    You will receive a reminder letter in the mail two months in advance. If you don't receive a letter, please call our office to schedule the follow-up appointment.  Remote monitoring is used to monitor your Pacemaker from home. This monitoring reduces the number of office visits required to check your device to one time per year. It allows [place] to keep an eye on the functioning of your device to ensure it is working properly. You are scheduled for a device check from home on 07/06/21. You may send your transmission at any time that day. If you have a wireless device, the transmission will be sent automatically. After your physician reviews your  transmission, you will receive a postcard with your next transmission date.  We recommend signing up for the patient portal called "MyChart".  Sign up information is provided on this After Visit Summary.  MyChart is used to connect with patients for Virtual Visits (Telemedicine).  Patients are able to view lab/test results, encounter notes, upcoming appointments, etc.  Non-urgent messages can be sent to your provider as well.   To learn more about what you can do with MyChart, go to [email or web address].    Any Other Special Instructions Will Be Listed Below (If Applicable).  Send transmission tonight for pacemaker. Call device clinic tomorrow to make sure it was received. # [phone]

## 2021-04-06 NOTE — Addendum Note (Signed)
Addended by: Sampson Goon on: 04/06/2021 05:11 PM   Modules accepted: Orders

## 2021-04-07 ENCOUNTER — Ambulatory Visit (INDEPENDENT_AMBULATORY_CARE_PROVIDER_SITE_OTHER): Payer: Medicare Other

## 2021-04-07 DIAGNOSIS — I495 Sick sinus syndrome: Secondary | ICD-10-CM | POA: Diagnosis not present

## 2021-04-07 LAB — CUP PACEART REMOTE DEVICE CHECK
Battery Impedance: 1927 Ohm
Battery Remaining Longevity: 32 mo
Battery Voltage: 2.74 V
Brady Statistic AP VP Percent: 69 %
Brady Statistic AP VS Percent: 0 %
Brady Statistic AS VP Percent: 29 %
Brady Statistic AS VS Percent: 1 %
Date Time Interrogation Session: 20221228074953
Implantable Lead Implant Date: 20131126
Implantable Lead Implant Date: 20131126
Implantable Lead Location: 753859
Implantable Lead Location: 753860
Implantable Lead Model: 5076
Implantable Lead Model: 5092
Implantable Pulse Generator Implant Date: 20131126
Lead Channel Impedance Value: 443 Ohm
Lead Channel Impedance Value: 489 Ohm
Lead Channel Pacing Threshold Amplitude: 0.625 V
Lead Channel Pacing Threshold Amplitude: 1.125 V
Lead Channel Pacing Threshold Pulse Width: 0.4 ms
Lead Channel Pacing Threshold Pulse Width: 0.4 ms
Lead Channel Setting Pacing Amplitude: 2 V
Lead Channel Setting Pacing Amplitude: 2.5 V
Lead Channel Setting Pacing Pulse Width: 0.4 ms
Lead Channel Setting Sensing Sensitivity: 2 mV

## 2021-04-09 DIAGNOSIS — N1831 Chronic kidney disease, stage 3a: Secondary | ICD-10-CM | POA: Diagnosis not present

## 2021-04-09 DIAGNOSIS — I130 Hypertensive heart and chronic kidney disease with heart failure and stage 1 through stage 4 chronic kidney disease, or unspecified chronic kidney disease: Secondary | ICD-10-CM | POA: Diagnosis not present

## 2021-04-09 DIAGNOSIS — E1122 Type 2 diabetes mellitus with diabetic chronic kidney disease: Secondary | ICD-10-CM | POA: Diagnosis not present

## 2021-04-09 DIAGNOSIS — I5032 Chronic diastolic (congestive) heart failure: Secondary | ICD-10-CM | POA: Diagnosis not present

## 2021-04-15 ENCOUNTER — Other Ambulatory Visit: Payer: Self-pay | Admitting: Cardiovascular Disease

## 2021-04-19 NOTE — Progress Notes (Signed)
Remote pacemaker transmission.   

## 2021-04-20 DIAGNOSIS — E119 Type 2 diabetes mellitus without complications: Secondary | ICD-10-CM | POA: Diagnosis not present

## 2021-04-20 DIAGNOSIS — E0821 Diabetes mellitus due to underlying condition with diabetic nephropathy: Secondary | ICD-10-CM | POA: Diagnosis not present

## 2021-04-20 DIAGNOSIS — H6122 Impacted cerumen, left ear: Secondary | ICD-10-CM | POA: Diagnosis not present

## 2021-04-20 DIAGNOSIS — I495 Sick sinus syndrome: Secondary | ICD-10-CM | POA: Diagnosis not present

## 2021-04-20 DIAGNOSIS — I1310 Hypertensive heart and chronic kidney disease without heart failure, with stage 1 through stage 4 chronic kidney disease, or unspecified chronic kidney disease: Secondary | ICD-10-CM | POA: Diagnosis not present

## 2021-05-04 ENCOUNTER — Other Ambulatory Visit: Payer: Self-pay | Admitting: *Deleted

## 2021-05-04 NOTE — Patient Outreach (Signed)
[facility]) Care Management  05/04/2021  [name] 1933/01/09 [id number]  [facility]) Care Management RN Health Coach Note   05/04/2021 Name:  [name] MRN:  [id number] DOB:  July 29, 1932  Summary: Patient states that she feels good. She continues to work with PT weekly and explains a nurse comes weekly to see her as well. Patient participates in [facility] remote program where she records her weight and B/P in a tablet and sends it to the nurse daily. Patient states her diabetes continues to be controlled and that she saw her PCP a couple of week ago and got a good report. Patient states her home environment is safe and that she is well supported by her family. .  Recommendations/Changes made from today's visit: Continue to limit sodium, sugar, and carbohydrates in your diet continue to walk, work with PT, and do PT exercises at home routinely Continue to participate with the [facility] remote program by recording your weight and B/P daily into the tablet and sending it to the nurse  Subjective: [name] is an 88 y.o. year old female who is a primary patient of Lucianne Lei, MD. The care management team was consulted for assistance with care management and/or care coordination needs.    RN Health Coach completed Telephone Visit today.   Objective:  Medications Reviewed Today     Reviewed by Michiel Cowboy, RN (Registered Nurse) on 05/04/21 at 61  Med List Status: <None>   Medication Order Taking? Sig Documenting Provider Last Dose Status Informant  acetaminophen (TYLENOL) 500 MG tablet [id number] Yes Take 500 mg by mouth every 6 (six) hours as needed for headache (pain). [provider] Taking Active Multiple Informants  allopurinol (ZYLOPRIM) 100 MG tablet [id number] Yes Take 100 mg by mouth at bedtime.  [provider] Taking Active Multiple Informants  amLODipine (NORVASC) 5 MG tablet [id number] Yes Take 5 mg by mouth 2 (two)  times daily. [provider] Taking Active   atorvastatin (LIPITOR) 20 MG tablet [id number] Yes TAKE 1 TABLET BY MOUTH  DAILY Sherren Mocha, MD Taking Active   azelastine (OPTIVAR) 0.05 % ophthalmic solution [id number] Yes Place 1 drop into both eyes 2 (two) times daily. [provider] Taking Active Multiple Informants           Med Note Tamala Julian, JEFFREY W   Mon Nov 23, 2015  1:44 AM)    betamethasone acetate-betamethasone sodium phosphate (CELESTONE) injection 3 mg [id number]   Edrick Kins, DPM  Active            Med Note Coralyn Mark, ASHELEY Forrest Moron May 07, 2018 10:26 AM)    carvedilol (COREG) 12.5 MG tablet [id number] Yes Take 1 tablet (12.5 mg total) by mouth 2 (two) times daily. Thompson Grayer, MD Taking Active   clopidogrel (PLAVIX) 75 MG tablet [id number] Yes One tablet by mouth ( 75 mg) daily. Richardson Dopp T, PA-C Taking Active   COVID-19 mRNA bivalent vaccine, Pfizer, (PFIZER COVID-19 [facility] BIVALENT) injection [id number] Yes Inject into the muscle. Carlyle Basques, MD Taking Active   dapagliflozin propanediol (FARXIGA) 5 MG TABS tablet [id number] Yes Take 5 mg by mouth daily. [provider] Taking Active Self  furosemide (LASIX) 40 MG tablet [id number] Yes TAKE 1 TABLET BY MOUTH  MONDAY WEDNESDAY AND FRIDAY WITH POTASSIUM Sherren Mocha, MD Taking Active   insulin NPH Human (HUMULIN N,NOVOLIN N) 100 UNIT/ML injection [id number] No Inject 5-45 Units into the skin See  admin instructions. Inject 45 units subcutaneously every morning and 10 units at night  Patient not taking: Reported on 05/04/2021   [provider] Not Taking Active            Med Note Olena Heckle, MICHELE   Tue Jul 21, 2020 11:48 AM)    Multiple Vitamin (MULTIVITAMIN WITH MINERALS) TABS tablet [id number] Yes Take 1 tablet by mouth at bedtime.  [provider] Taking Active Multiple Informants  nitroGLYCERIN (NITROSTAT) 0.4 MG SL tablet [id number] Yes DISSOLVE ONE TABLET UNDER THE TONGUE EVERY 5  MINUTES AS NEEDED FOR CHEST PAIN.  DO NOT EXCEED A TOTAL OF 3 DOSES IN [address] [place] K, NP Taking Active   ONE TOUCH ULTRA TEST test strip [id number] Yes 1 each by Other route daily as needed (blood sugar).  [provider] Taking Active Multiple Informants           Med Note Inocente Salles, RACHEL A   Sun Aug 03, 2014  9:23 AM)     oxyCODONE-acetaminophen (PERCOCET/ROXICET) 5-325 MG tablet [id number] No Take 1 tablet by mouth daily as needed (pain).  Patient not taking: Reported on 05/04/2021   [provider] Not Taking Active            Med Note Laretta Alstrom, Hanh Kertesz A   Mon Feb 01, 2021 12:13 PM) completed  potassium chloride (KLOR-CON M) 10 MEQ tablet [id number] Yes TAKE 1 TABLET BY MOUTH  [place] WITH Minus Breeding, MD Taking Active   sacubitril-valsartan (ENTRESTO) 97-103 MG [id number] Yes Take 1 tablet by mouth 2 (two) times daily. Sherren Mocha, MD Taking Active   tobramycin (TOBREX) 0.3 % ophthalmic solution [id number] Yes Place 1 drop into the left eye 4 (four) times daily. [provider] Taking Active              SDOH:  (Social Determinants of Health) assessments and interventions performed: SDOH assessments completed today and documented in the Epic system.    Care Plan  Review of patient past medical history, allergies, medications, health status, including review of consultants reports, laboratory and other test data, was performed as part of comprehensive evaluation for care management services.   Care Plan : Wellness (Adult)  Updates made by Michiel Cowboy, RN since 05/04/2021 12:00 AM     Problem: Health Literacy (Wellness) Resolved 05/04/2021  Priority: Medium     Long-Range Goal: Health Literacy Improved Completed 05/04/2021  Start Date: 02/24/2020  Expected End Date: 08/08/2020  Note:   Resolving due to duplicate goal  Evidence-based guidance:  Assess health literacy using Institute of Medicine recommended questions  or standardized tools.  Use a universal method with health literacy to esure a nonjudgmental approach to varying levels of literacy.  Identify barriers to seeking or understanding information.  Note: Patient barriers may include literacy, language or culture, mental health, stigma, embarrassment; clinician barriers may include avoidance, time constraint, stereotype or bias.  Collaborate with interprofessional team and child and parent/caregiver to develop a structured yet individualized education plan that supports shared decision-making.  Consider a combination of strategies such as print, audiotape, video and computer-based learning in a group or individual setting.  Encourage patient to seek health information and education in the community such as Art therapist, continuing education, English as a second language class, support group or peer Engineer, maintenance.  Provide educational materials that are written in plain language (3rd to 5th grade reading level) and include icons, pictures and tables; provide essential information  first.   Notes:     Care Plan : Diabetes Type 2 (Adult)  Updates made by Michiel Cowboy, RN since 05/04/2021 12:00 AM     Problem: Glycemic Management (Diabetes, Type 2) Resolved 05/04/2021  Priority: Medium     Long-Range Goal: Glycemic Management Optimized Completed 05/04/2021  Start Date: 02/24/2020  Expected End Date: 03/10/2021  Note:   Resolving due to duplicate goal  Evidence-based guidance:  Anticipate A1C testing (point-of-care) every 3 to 6 months based on goal attainment.  Review mutually-set A1C goal or target range.  Anticipate use of antihyperglycemic with or without insulin and periodic adjustments; consider active involvement of pharmacist.  Provide medical nutrition therapy and development of individualized eating.  Compare self-reported symptoms of hypo or hyperglycemia to blood glucose levels, diet and fluid intake, current medications, psychosocial and  physiologic stressors, change in activity and barriers to care adherence.  Promote self-monitoring of blood glucose levels.  Assess and address barriers to management plan, such as food insecurity, age, developmental ability, depression, anxiety, fear of hypoglycemia or weight gain, as well as medication cost, side effects and complicated regimen.  Consider referral to community-based diabetes education program, visiting nurse, community health worker or health coach.  Encourage regular dental care for treatment of periodontal disease; refer to dental provider when needed.   Notes:     Task: Alleviate Barriers to Glycemic Management Completed 05/04/2021  Due Date: 03/10/2021  Note:   Care Management Activities:    - barriers to adherence to treatment plan identified - blood glucose monitoring encouraged - blood glucose readings reviewed - mutual A1C goal set or reviewed - self-awareness of signs/symptoms of hypo or hyperglycemia encouraged - use of blood glucose monitoring log promoted    Notes:     Care Plan : [place] of Care  Updates made by Michiel Cowboy, RN since 05/04/2021 12:00 AM     Problem: Knowledge Deficit Related to Diabetes, CHF, Hypertension   Priority: High     Long-Range Goal: Development of Plan of Care for the Management of Diabetes, CHF, Hypertension   Start Date: 05/04/2021  Expected End Date: 05/10/2021  Priority: High  Note:   Current Barriers:  Chronic Disease Management support and education needs related to CHF, HTN, and DMII   RNCM Clinical Goal(s):  Patient will demonstrate Ongoing adherence to prescribed treatment plan for CHF, HTN, and DMII as evidenced by maintaining A1c 7 or less; B/P < 150/90; continuation of weighing daily; continuation of participating with [facility] remote program continue to work with Consulting civil engineer to address care management and care coordination needs related to  CHF, HTN, and DMII as evidenced by adherence to CM Team  Scheduled appointments through collaboration with RN Care manager, provider, and care team.   Interventions: Inter-disciplinary care team collaboration (see longitudinal plan of care) Evaluation of current treatment plan related to  self management and patient's adherence to plan as established by provider   Heart Failure Interventions:  (Status:  Goal on track:  Yes.) Long Term Goal Basic overview and discussion of pathophysiology of Heart Failure reviewed Provided education on low sodium diet Reviewed Heart Failure Action Plan in depth and provided written copy Assessed need for readable accurate scales in home Provided education about placing scale on hard, flat surface Advised patient to weigh each morning after emptying bladder Discussed importance of daily weight and advised patient to weigh and record daily Provided patient with education about the role of exercise in  the management of heart failure Screening for signs and symptoms of depression related to chronic disease state  Encouraged continued participation with the [facility] remote program  Diabetes Interventions:  (Status:  Goal on track:  Yes.) Long Term Goal Assessed patient's understanding of A1c goal: <7% Provided education to patient about basic DM disease process Reviewed medications with patient and discussed importance of medication adherence Provided patient with written educational materials related to hypo and hyperglycemia and importance of correct treatment Advised patient, providing education and rationale, to check cbg daily and record, calling PCP for findings outside established parameters Assessed social determinant of health barriers Encouraged continuation of limiting sugar and carbohydrates in diet Encouraged continuation of routine walking, working with PT, and doing PT exercises on off days at home Lab Results  Component Value Date   HGBA1C 6.6 (H) 11/29/2013   Hypertension Interventions:  (Status:   Goal on track:  NO.) Long Term Goal Last practice recorded BP readings:  BP Readings from Last 3 Encounters:  04/06/21 (!) 168/74  07/21/20 132/70  02/27/20 (!) 166/62  Most recent eGFR/CrCl: No results found for: EGFR  No components found for: CRCL  Evaluation of current treatment plan related to hypertension self management and patient's adherence to plan as established by provider Reviewed medications with patient and discussed importance of compliance Discussed plans with patient for ongoing care management follow up and provided patient with direct contact information for care management team Advised patient, providing education and rationale, to monitor blood pressure daily and record, calling PCP for findings outside established parameters Provided education on prescribed diet low sodium Discussed complications of poorly controlled blood pressure such as heart disease, stroke, circulatory complications, vision complications, kidney impairment, sexual dysfunction Encouraged continuation of routine walking, working with PT, and doing PT exercises at home on off days Encouraged continued participation with the [facility] remote program  Patient Goals/Self-Care Activities: Take all medications as prescribed Attend all scheduled provider appointments Call pharmacy for medication refills 3-7 days in advance of running out of medications Call provider office for new concerns or questions  call office if I gain more than 2 pounds in one day or 5 pounds in one week keep legs up while sitting use salt in moderation weigh myself daily follow rescue plan if symptoms flare-up eat more whole grains, fruits and vegetables, lean meats and healthy fats keep appointment with eye doctor check blood sugar at prescribed times: once daily check feet daily for cuts, sores or redness enter blood sugar readings and medication or insulin into daily log trim toenails straight across drink 6 to 8 glasses of  water each day fill half of plate with vegetables manage portion size wear comfortable, well-fitting shoes check blood pressure daily write blood pressure results in a log or diary call doctor for signs and symptoms of high blood pressure take medications for blood pressure exactly as prescribed report new symptoms to your doctor eat more whole grains, fruits and vegetables, lean meats and healthy fats Continue to limit sodium, sugar, and carbohydrates in your diet continue to walk, work with PT, and do PT exercises at home on off days routinely Continue to participate with the [facility] remote program by recording your weight and B/P daily into the tablet and sending it to the nurse Follow Up Plan:  Telephone follow up appointment with care management team member scheduled for:  April       Plan: Telephone follow up appointment with care management team member scheduled for:  April . Nurse will send PCP a quarterly update.  Emelia Loron RN, [place] [phone] [email or web address]

## 2021-05-04 NOTE — Patient Instructions (Addendum)
Visit Information  Thank you for taking time to visit with me today. Please don't hesitate to contact me if I can be of assistance to you before our next scheduled telephone appointment.  Following are the goals we discussed today:  Patient Goals/Self-Care Activities: Take all medications as prescribed Attend all scheduled provider appointments Call pharmacy for medication refills 3-7 days in advance of running out of medications Call provider office for new concerns or questions  call office if I gain more than 2 pounds in one day or 5 pounds in one week keep legs up while sitting use salt in moderation weigh myself daily follow rescue plan if symptoms flare-up eat more whole grains, fruits and vegetables, lean meats and healthy fats keep appointment with eye doctor check blood sugar at prescribed times: once daily check feet daily for cuts, sores or redness enter blood sugar readings and medication or insulin into daily log trim toenails straight across drink 6 to 8 glasses of water each day fill half of plate with vegetables manage portion size wear comfortable, well-fitting shoes check blood pressure daily write blood pressure results in a log or diary call doctor for signs and symptoms of high blood pressure take medications for blood pressure exactly as prescribed report new symptoms to your doctor eat more whole grains, fruits and vegetables, lean meats and healthy fats Continue to limit sodium, sugar, and carbohydrates in your diet continue to walk, work with PT, and do PT exercises at home on off days routinely Continue to participate with the [facility] remote program by recording your weight and B/P daily into the tablet and sending it to the nurse  The patient verbalized understanding of instructions, educational materials, and care plan provided today and agreed to receive a mailed copy of patient instructions, educational materials, and care plan.   Telephone follow up  appointment with care management team member scheduled for: April  Blanchie Serve RN, Mining engineer [facility] [phone] [email or web address]

## 2021-05-09 DIAGNOSIS — I5032 Chronic diastolic (congestive) heart failure: Secondary | ICD-10-CM | POA: Diagnosis not present

## 2021-05-09 DIAGNOSIS — N1831 Chronic kidney disease, stage 3a: Secondary | ICD-10-CM | POA: Diagnosis not present

## 2021-05-09 DIAGNOSIS — I130 Hypertensive heart and chronic kidney disease with heart failure and stage 1 through stage 4 chronic kidney disease, or unspecified chronic kidney disease: Secondary | ICD-10-CM | POA: Diagnosis not present

## 2021-05-09 DIAGNOSIS — E1122 Type 2 diabetes mellitus with diabetic chronic kidney disease: Secondary | ICD-10-CM | POA: Diagnosis not present

## 2021-06-04 DIAGNOSIS — J329 Chronic sinusitis, unspecified: Secondary | ICD-10-CM | POA: Diagnosis not present

## 2021-06-10 ENCOUNTER — Other Ambulatory Visit: Payer: Medicare Other | Admitting: *Deleted

## 2021-06-10 NOTE — Patient Outreach (Addendum)
[facility]) Care Management ? ?06/10/2021 ? ?[name] Vandenheuvel ?[birth year] ?[id number] ? ?Nurse answered incoming call from [facility] office @ 1343 who had patient on hold to transfer call to nurse. Patient explained that she was in Dr. Tedra Senegal office 06/04/21 due to left ear issues. Her ear was cleaned out and it started to bleed. Per patient, Dr. Parke Simmers asked that an immediate referral be placed to ENT for a follow-up to examine her left ear. Patient states her ear bled until Sunday. Currently, the patient denies any left ear bleeding. She states that her ear does not hurt but she feels like there is something moving around inside. Patient denies any ear discoloration, fevers, or drainage coming out of her ear presently. Patient states that her hearing on the left side has diminished. Patient reports calling Dr. Tedra Senegal office to report her ongoing ear issues and to inquire about the ENT referral. Adding, that she was placed on hold several times and did not get a response or call back. Nurse stated she would call Dr. Tedra Senegal Office to ask about the ENT referral and to inform  her PCP about her ongoing ear issues.  ? ?Update 1412: ?Nurse Called Dr. Tedra Senegal office to inquire about the ENT referral and to explain about ongoing ear issues. Nurse spoke to Lea who stated the referral individual was at lunch and she would leave them a message to call this nurse back.  ? ?Update 1517: ?Called Dr. Tedra Senegal office back and spoke to [facility] Dr. Tedra Senegal nurse. Cordelia Pen explained that the soonest available ENT appointment would be in May. She added that the patient may be able to get an ENT appointment sooner if she was willing to go outside of the [place] area and that if the patient felt she was having an ear emergency she could go to the ED. It was discussed that if the patient began to have ear infection symptoms she could call Dr. Tedra Senegal office for a follow-up appointment.  ? ?Update 1525: ?Successful telephone  outreach call to patient. HIPAA identifiers obtained. Nurse discussed with the patient that an ENT appointment would not be available until May. Nurse added that if the patient was willing to travel out of the [place] area she may be able to be seen sooner. Patient was unsure about traveling further. Nurse discussed with the patient symptoms of ear infection and stated if this started to occur for her to call Dr. Tedra Senegal office to make an appointment to be treated. Nurse also discussed that if the patient felt she was having an ear emergency she could go to the ED to be treated. Nurse advised the patient to call Dr. Tedra Senegal office if she decided to try to get an earlier appointment out of the [place] area. Patient was appreciative for the update and verbalized understanding. ? ?Plan: RN Health Coach will call patient within the month of April.  ? ?Blanchie Serve RN, BSN ?[facility] Care Management  ?RN Health Coach ?[phone] ?[email or web address] ? ? ?

## 2021-06-11 DIAGNOSIS — S01311A Laceration without foreign body of right ear, initial encounter: Secondary | ICD-10-CM | POA: Diagnosis not present

## 2021-06-19 ENCOUNTER — Other Ambulatory Visit: Payer: Self-pay | Admitting: Cardiovascular Disease

## 2021-06-23 DIAGNOSIS — H353221 Exudative age-related macular degeneration, left eye, with active choroidal neovascularization: Secondary | ICD-10-CM | POA: Diagnosis not present

## 2021-06-23 DIAGNOSIS — H353111 Nonexudative age-related macular degeneration, right eye, early dry stage: Secondary | ICD-10-CM | POA: Diagnosis not present

## 2021-06-23 DIAGNOSIS — H43813 Vitreous degeneration, bilateral: Secondary | ICD-10-CM | POA: Diagnosis not present

## 2021-06-23 DIAGNOSIS — H35371 Puckering of macula, right eye: Secondary | ICD-10-CM | POA: Diagnosis not present

## 2021-06-23 DIAGNOSIS — E113293 Type 2 diabetes mellitus with mild nonproliferative diabetic retinopathy without macular edema, bilateral: Secondary | ICD-10-CM | POA: Diagnosis not present

## 2021-07-06 ENCOUNTER — Other Ambulatory Visit: Payer: Self-pay

## 2021-07-06 ENCOUNTER — Emergency Department (HOSPITAL_COMMUNITY)
Admission: EM | Admit: 2021-07-06 | Discharge: 2021-07-06 | Disposition: A | Discharge status: Home / Self Care | Payer: Medicare Other | Attending: Emergency Medicine | Admitting: Emergency Medicine

## 2021-07-06 ENCOUNTER — Emergency Department (HOSPITAL_COMMUNITY): Payer: Medicare Other

## 2021-07-06 ENCOUNTER — Encounter (HOSPITAL_COMMUNITY): Payer: Self-pay | Admitting: Pharmacy Technician

## 2021-07-06 DIAGNOSIS — I5023 Acute on chronic systolic (congestive) heart failure: Secondary | ICD-10-CM | POA: Diagnosis not present

## 2021-07-06 DIAGNOSIS — I110 Hypertensive heart disease with heart failure: Secondary | ICD-10-CM | POA: Diagnosis not present

## 2021-07-06 DIAGNOSIS — Z5321 Procedure and treatment not carried out due to patient leaving prior to being seen by health care provider: Secondary | ICD-10-CM | POA: Diagnosis not present

## 2021-07-06 DIAGNOSIS — I509 Heart failure, unspecified: Secondary | ICD-10-CM | POA: Insufficient documentation

## 2021-07-06 DIAGNOSIS — R21 Rash and other nonspecific skin eruption: Secondary | ICD-10-CM | POA: Diagnosis not present

## 2021-07-06 DIAGNOSIS — R0602 Shortness of breath: Secondary | ICD-10-CM | POA: Insufficient documentation

## 2021-07-06 LAB — CBC WITH DIFFERENTIAL/PLATELET
Abs Immature Granulocytes: 0.03 10*3/uL (ref 0.00–0.07)
Basophils Absolute: 0 10*3/uL (ref 0.0–0.1)
Basophils Relative: 1 %
Eosinophils Absolute: 0.5 10*3/uL (ref 0.0–0.5)
Eosinophils Relative: 6 %
HCT: 33.2 % — ABNORMAL LOW (ref 36.0–46.0)
Hemoglobin: 10.8 g/dL — ABNORMAL LOW (ref 12.0–15.0)
Immature Granulocytes: 0 %
Lymphocytes Relative: 19 %
Lymphs Abs: 1.3 10*3/uL (ref 0.7–4.0)
MCH: 32.1 pg (ref 26.0–34.0)
MCHC: 32.5 g/dL (ref 30.0–36.0)
MCV: 98.8 fL (ref 80.0–100.0)
Monocytes Absolute: 0.5 10*3/uL (ref 0.1–1.0)
Monocytes Relative: 8 %
Neutro Abs: 4.6 10*3/uL (ref 1.7–7.7)
Neutrophils Relative %: 66 %
Platelets: 162 10*3/uL (ref 150–400)
RBC: 3.36 MIL/uL — ABNORMAL LOW (ref 3.87–5.11)
RDW: 12.6 % (ref 11.5–15.5)
WBC: 7 10*3/uL (ref 4.0–10.5)
nRBC: 0 % (ref 0.0–0.2)

## 2021-07-06 LAB — BASIC METABOLIC PANEL
Anion gap: 7 (ref 5–15)
BUN: 28 mg/dL — ABNORMAL HIGH (ref 8–23)
CO2: 25 mmol/L (ref 22–32)
Calcium: 8.8 mg/dL — ABNORMAL LOW (ref 8.9–10.3)
Chloride: 107 mmol/L (ref 98–111)
Creatinine, Ser: 1.26 mg/dL — ABNORMAL HIGH (ref 0.44–1.00)
GFR, Estimated: 42 mL/min — ABNORMAL LOW (ref >60)
Glucose, Bld: 159 mg/dL — ABNORMAL HIGH (ref 70–99)
Potassium: 3.8 mmol/L (ref 3.5–5.1)
Sodium: 139 mmol/L (ref 135–145)

## 2021-07-06 LAB — TROPONIN I (HIGH SENSITIVITY): Troponin I (High Sensitivity): 14 ng/L (ref <18)

## 2021-07-06 LAB — BRAIN NATRIURETIC PEPTIDE: B Natriuretic Peptide: 247 pg/mL — ABNORMAL HIGH (ref 0.0–100.0)

## 2021-07-06 NOTE — ED Triage Notes (Signed)
Pt states home health nurse told her to come have her lungs checked out. Pt reports minimal shob.  ?

## 2021-07-06 NOTE — ED Notes (Signed)
Patient was wheeled out by family member and stated she was leaving.  ?

## 2021-07-06 NOTE — ED Provider Triage Note (Signed)
Emergency Medicine Provider Triage Evaluation Note ? ?[name] , a 88 y.o. female  was evaluated in triage.  Pt complains of some shortness of breath, she was told to come in for evaluation by her home health nurse.  She has a history of heart failure, takes diuretic reports she is been taking as normal.  She reports no significant change in the amount of swelling that is on her legs at baseline.  She reports that she feels slightly more short of breath than normal.  She endorses that she feels cold but that is pretty normal for her.  She denies any chest pain today, does report that she occasionally gets chest pain with exertion.  She does endorse some tenderness when I press on her chest.  She denies any fever, cough, sputum production, abdominal pain. ? ?Review of Systems  ?Positive: shob ?Negative: Chest pain, fever, abdominal pain, NV ? ?Physical Exam  ?BP (!) 176/64   Pulse (!) 59   Temp (!) 97.5 ?F (36.4 ?C) (Oral)   Resp 16   SpO2 100%  ?Gen:   Awake, no distress   ?Resp:  poor effort  ?MSK:   Moves extremities without difficulty  ?Other:  Poor respiratory effort throughout, no significant crackles or rales noted.  2+ pitting edema bilateral lower extremities. ? ?Medical Decision Making  ?Medically screening exam initiated at 12:11 PM.  Appropriate orders placed.  Milas Gain Winslow was informed that the remainder of the evaluation will be completed by another provider, this initial triage assessment does not replace that evaluation, and the importance of remaining in the ED until their evaluation is complete. ? ?Workup initiated ?  ?Anselmo Pickler, PA-C ?07/06/21 1212 ? ?

## 2021-07-07 ENCOUNTER — Emergency Department (HOSPITAL_COMMUNITY)
Admission: EM | Admit: 2021-07-07 | Discharge: 2021-07-07 | Disposition: A | Discharge status: Home / Self Care | Payer: Medicare Other | Attending: Emergency Medicine | Admitting: Emergency Medicine

## 2021-07-07 ENCOUNTER — Other Ambulatory Visit: Payer: Self-pay | Admitting: Cardiovascular Disease

## 2021-07-07 ENCOUNTER — Encounter (HOSPITAL_COMMUNITY): Payer: Self-pay | Admitting: Emergency Medicine

## 2021-07-07 ENCOUNTER — Other Ambulatory Visit: Payer: Self-pay

## 2021-07-07 ENCOUNTER — Emergency Department (HOSPITAL_COMMUNITY): Payer: Medicare Other

## 2021-07-07 DIAGNOSIS — I2510 Atherosclerotic heart disease of native coronary artery without angina pectoris: Secondary | ICD-10-CM | POA: Insufficient documentation

## 2021-07-07 DIAGNOSIS — Z794 Long term (current) use of insulin: Secondary | ICD-10-CM | POA: Diagnosis not present

## 2021-07-07 DIAGNOSIS — R0602 Shortness of breath: Secondary | ICD-10-CM

## 2021-07-07 DIAGNOSIS — Z7984 Long term (current) use of oral hypoglycemic drugs: Secondary | ICD-10-CM | POA: Diagnosis not present

## 2021-07-07 DIAGNOSIS — Z79899 Other long term (current) drug therapy: Secondary | ICD-10-CM | POA: Insufficient documentation

## 2021-07-07 DIAGNOSIS — E119 Type 2 diabetes mellitus without complications: Secondary | ICD-10-CM | POA: Diagnosis not present

## 2021-07-07 DIAGNOSIS — I509 Heart failure, unspecified: Secondary | ICD-10-CM | POA: Insufficient documentation

## 2021-07-07 DIAGNOSIS — N189 Chronic kidney disease, unspecified: Secondary | ICD-10-CM | POA: Insufficient documentation

## 2021-07-07 DIAGNOSIS — R600 Localized edema: Secondary | ICD-10-CM | POA: Diagnosis not present

## 2021-07-07 DIAGNOSIS — I110 Hypertensive heart disease with heart failure: Secondary | ICD-10-CM | POA: Diagnosis not present

## 2021-07-07 DIAGNOSIS — R21 Rash and other nonspecific skin eruption: Secondary | ICD-10-CM | POA: Insufficient documentation

## 2021-07-07 DIAGNOSIS — I130 Hypertensive heart and chronic kidney disease with heart failure and stage 1 through stage 4 chronic kidney disease, or unspecified chronic kidney disease: Secondary | ICD-10-CM | POA: Insufficient documentation

## 2021-07-07 DIAGNOSIS — Z950 Presence of cardiac pacemaker: Secondary | ICD-10-CM | POA: Insufficient documentation

## 2021-07-07 LAB — BASIC METABOLIC PANEL
Anion gap: 7 (ref 5–15)
BUN: 30 mg/dL — ABNORMAL HIGH (ref 8–23)
CO2: 25 mmol/L (ref 22–32)
Calcium: 8.9 mg/dL (ref 8.9–10.3)
Chloride: 108 mmol/L (ref 98–111)
Creatinine, Ser: 1.24 mg/dL — ABNORMAL HIGH (ref 0.44–1.00)
GFR, Estimated: 42 mL/min — ABNORMAL LOW (ref >60)
Glucose, Bld: 127 mg/dL — ABNORMAL HIGH (ref 70–99)
Potassium: 3.8 mmol/L (ref 3.5–5.1)
Sodium: 140 mmol/L (ref 135–145)

## 2021-07-07 LAB — CBC WITH DIFFERENTIAL/PLATELET
Abs Immature Granulocytes: 0.03 10*3/uL (ref 0.00–0.07)
Basophils Absolute: 0 10*3/uL (ref 0.0–0.1)
Basophils Relative: 1 %
Eosinophils Absolute: 0.5 10*3/uL (ref 0.0–0.5)
Eosinophils Relative: 7 %
HCT: 34.7 % — ABNORMAL LOW (ref 36.0–46.0)
Hemoglobin: 11.3 g/dL — ABNORMAL LOW (ref 12.0–15.0)
Immature Granulocytes: 0 %
Lymphocytes Relative: 23 %
Lymphs Abs: 1.6 10*3/uL (ref 0.7–4.0)
MCH: 31.8 pg (ref 26.0–34.0)
MCHC: 32.6 g/dL (ref 30.0–36.0)
MCV: 97.7 fL (ref 80.0–100.0)
Monocytes Absolute: 0.6 10*3/uL (ref 0.1–1.0)
Monocytes Relative: 9 %
Neutro Abs: 4.3 10*3/uL (ref 1.7–7.7)
Neutrophils Relative %: 60 %
Platelets: 172 10*3/uL (ref 150–400)
RBC: 3.55 MIL/uL — ABNORMAL LOW (ref 3.87–5.11)
RDW: 12.6 % (ref 11.5–15.5)
WBC: 7.1 10*3/uL (ref 4.0–10.5)
nRBC: 0 % (ref 0.0–0.2)

## 2021-07-07 LAB — BRAIN NATRIURETIC PEPTIDE: B Natriuretic Peptide: 271.8 pg/mL — ABNORMAL HIGH (ref 0.0–100.0)

## 2021-07-07 LAB — TROPONIN I (HIGH SENSITIVITY)
Troponin I (High Sensitivity): 17 ng/L (ref <18)
Troponin I (High Sensitivity): 18 ng/L — ABNORMAL HIGH (ref <18)

## 2021-07-07 MED ORDER — HYDROCORTISONE 1 % EX CREA
TOPICAL_CREAM | Freq: Once | CUTANEOUS | Status: AC
  Filled 2021-07-07 (×2): qty 28

## 2021-07-07 MED ORDER — FUROSEMIDE 10 MG/ML IJ SOLN
40.0000 mg | Freq: Once | INTRAMUSCULAR | Status: AC
  Administered 2021-07-07: 40 mg via INTRAVENOUS
  Filled 2021-07-07: qty 4

## 2021-07-07 NOTE — ED Provider Notes (Signed)
?[place] ?Provider Note ? ? ?CSN: [id number] ?Arrival date & time: 07/07/21  0303 ? ?  ? ?History ? ?Chief Complaint  ?Patient presents with  ? Rash  ? Shortness of Breath  ? ? ?[name] is a 88 y.o. female with PMHx HTN, HLD, Diabetes, CAD s/p MI, pacemaker, CKD, and CHF who presents to the ED today with complaint of gradual onset, constant, itchy, rash to bilateral arms that she noticed about 1 hour prior to arrival. Pt denies any new medications, foods, hygiene products. No throat swelling, wheezing, abd pain, nausea, vomiting.  ? ?She also complains of shortness of breath. She was seen by her [place] yesterday morning who thought she sounded "tight" and advised she come to the ED for further eval. She was medically screened yesterday however left prior to being seen by a provider in the back. CXR at that time with prominence of central pulmonary vessels without signs of alveolar pulmonary edema. She returned today and would like to address the SOB as well - she does mention it feels slightly improved from yesterday however. Denies any chest pain. Denies feeling like her legs are more swollen than normal. Reports compliance with her 40 mg Lasix - takes every M W F. Did not take it this AM as she has been in the ED since the middle of the night.  ? ?The history is provided by the patient, a relative and medical records.  ? ?  ? ?Home Medications ?Prior to Admission medications   ?Medication Sig Start Date End Date Taking? Authorizing Provider  ?acetaminophen (TYLENOL) 500 MG tablet Take 500 mg by mouth every 6 (six) hours as needed for headache (pain).    [provider]  ?allopurinol (ZYLOPRIM) 100 MG tablet Take 100 mg by mouth at bedtime.     [provider]  ?amLODipine (NORVASC) 5 MG tablet Take 5 mg by mouth 2 (two) times daily.    [provider]  ?atorvastatin (LIPITOR) 20 MG tablet TAKE 1 TABLET BY MOUTH  DAILY 12/16/20   Sherren Mocha, MD  ?azelastine (OPTIVAR) 0.05 % ophthalmic solution Place 1 drop into both eyes 2 (two) times daily. 10/26/15   [provider]  ?carvedilol (COREG) 12.5 MG tablet Take 1 tablet (12.5 mg total) by mouth 2 (two) times daily. 04/06/21   Allred, Jeneen Rinks, MD  ?clopidogrel (PLAVIX) 75 MG tablet One tablet by mouth ( 75 mg) daily. 07/21/20   Richardson Dopp T, PA-C  ?dapagliflozin propanediol (FARXIGA) 5 MG TABS tablet Take 5 mg by mouth daily.    [provider]  ?furosemide (LASIX) 40 MG tablet TAKE 1 TABLET BY MOUTH DAILY ON  MONDAY WEDNESDAY AND FRIDAY WITH POTASSIUM. Please call and make an appt with Dr. Burt Knack for additional refills [phone]. Thank you. First attempt. 06/23/21   Sherren Mocha, MD  ?insulin NPH Human (HUMULIN N,NOVOLIN N) 100 UNIT/ML injection Inject 5-45 Units into the skin See admin instructions. Inject 45 units subcutaneously every morning and 10 units at night ?Patient not taking: Reported on 05/04/2021    [provider]  ?Multiple Vitamin (MULTIVITAMIN WITH MINERALS) TABS tablet Take 1 tablet by mouth at bedtime.     [provider]  ?nitroGLYCERIN (NITROSTAT) 0.4 MG SL tablet DISSOLVE ONE TABLET UNDER THE TONGUE EVERY 5 MINUTES AS NEEDED FOR CHEST PAIN.  DO NOT EXCEED A TOTAL OF 3 DOSES IN 15 MINUTES 02/27/20   Patsey Berthold, NP  ?ONE  TOUCH ULTRA TEST test strip 1 each by Other route daily as needed (blood sugar).  07/10/13   [provider]  ?oxyCODONE-acetaminophen (PERCOCET/ROXICET) 5-325 MG tablet Take 1 tablet by mouth daily as needed (pain). ?Patient not taking: Reported on 05/04/2021 04/24/17   [provider]  ?pantoprazole (PROTONIX) 40 MG tablet Take 40 mg by mouth daily. 04/20/21   [provider]  ?potassium chloride (KLOR-CON M) 10 MEQ tablet TAKE 1 TABLET BY MOUTH  MONDAY [facility] AND FRIDAY WITH LASIX 04/06/21   Sherren Mocha, MD  ?sacubitril-valsartan (ENTRESTO) 97-103 MG Take 1 tablet by mouth 2 (two)  times daily. 02/15/21   Sherren Mocha, MD  ?tobramycin (TOBREX) 0.3 % ophthalmic solution Place 1 drop into the left eye 4 (four) times daily. 06/13/20   [provider]  ?   ? ?Allergies    ?Ciprofloxacin, Codeine, Hydrocodone, Penicillins, Shellfish allergy, Diltiazem hcl, Sulfonamide derivatives, Morphine and related, Lovastatin, and Metformin   ? ?Review of Systems   ?Review of Systems  ?Constitutional:  Negative for chills and fever.  ?Respiratory:  Positive for shortness of breath. Negative for cough, chest tightness, wheezing and stridor.   ?Cardiovascular:  Positive for leg swelling (chronic, no worse than normal). Negative for chest pain.  ?Gastrointestinal:  Negative for abdominal pain, nausea and vomiting.  ?Skin:  Positive for rash.  ?All other systems reviewed and are negative. ? ?Physical Exam ?Updated Vital Signs ?BP (!) 166/128   Pulse (!) 59   Temp 97.9 ?F (36.6 ?C)   Resp (!) 23   SpO2 100%  ?Physical Exam ?Vitals and nursing note reviewed.  ?Constitutional:   ?   Appearance: She is not ill-appearing or diaphoretic.  ?HENT:  ?   Head: Normocephalic and atraumatic.  ?Eyes:  ?   Conjunctiva/sclera: Conjunctivae normal.  ?Cardiovascular:  ?   Rate and Rhythm: Normal rate and regular rhythm.  ?   Pulses: Normal pulses.  ?Pulmonary:  ?   Effort: Pulmonary effort is normal.  ?   Breath sounds: No wheezing, rhonchi or rales.  ?   Comments: Slight diminished breath sounds however no overt wheezing, rales, rhonchi. Satting 100% on RA.  ?Abdominal:  ?   Palpations: Abdomen is soft.  ?   Tenderness: There is no abdominal tenderness.  ?Musculoskeletal:  ?   Cervical back: Neck supple.  ?   Right lower leg: Edema present.  ?   Left lower leg: Edema present.  ?   Comments: 1+ pitting edema bilaterally  ?Skin: ?   General: Skin is warm and dry.  ?   Findings: Rash present.  ?   Comments: Faint macular rash to bilateral forearms. No lesions appreciated to the web spaces of all digits.   ?Neurological:   ?   Mental Status: She is alert.  ? ? ?ED Results / Procedures / Treatments   ?Labs ?(all labs ordered are listed, but only abnormal results are displayed) ?Labs Reviewed  ?CBC WITH DIFFERENTIAL/PLATELET - Abnormal; Notable for the following components:  ?    Result Value  ? RBC 3.55 (*)   ? Hemoglobin 11.3 (*)   ? HCT 34.7 (*)   ? All other components within normal limits  ?BASIC METABOLIC PANEL - Abnormal; Notable for the following components:  ? Glucose, Bld 127 (*)   ? BUN 30 (*)   ? Creatinine, Ser 1.24 (*)   ? GFR, Estimated 42 (*)   ? All other components within normal limits  ?BRAIN NATRIURETIC PEPTIDE -  Abnormal; Notable for the following components:  ? B Natriuretic Peptide 271.8 (*)   ? All other components within normal limits  ?TROPONIN I (HIGH SENSITIVITY) - Abnormal; Notable for the following components:  ? Troponin I (High Sensitivity) 18 (*)   ? All other components within normal limits  ?TROPONIN I (HIGH SENSITIVITY)  ? ? ?EKG ?None ? ?Radiology ?DG Chest 2 View ? ?Result Date: 07/07/2021 ?CLINICAL DATA:  Shortness of breath. EXAM: CHEST - 2 VIEW COMPARISON:  Chest radiograph dated 07/06/2021. FINDINGS: Shallow inspiration. No focal consolidation, pleural effusion, pneumothorax. Mild cardiomegaly. Median sternotomy wires, CABG vascular clips, and left pectoral pacemaker device. No acute osseous pathology. IMPRESSION: No active cardiopulmonary disease. Electronically Signed   By: Anner Crete M.D.   On: 07/07/2021 03:56  ? ?DG Chest 2 View ? ?Result Date: 07/06/2021 ?CLINICAL DATA:  Shortness of breath EXAM: CHEST - 2 VIEW COMPARISON:  Previous studies including chest radiographs done on 11/05/2019 FINDINGS: Transverse diameter of heart is increased. There is previous coronary bypass surgery. Pacemaker battery is seen in the left infraclavicular region. There are no signs of alveolar pulmonary edema. There is slight prominence of central pulmonary vessels, more so in the right lung. There is  ill-defined soft tissue density measuring approximally 2.4 cm in transverse diameter superimposed over the left apex. Patient's chin is lower than usual partly obscuring the apices. There is possible minimal blunt

## 2021-07-07 NOTE — ED Triage Notes (Signed)
Pt reported to ED for evaluation of rash on both arms. Pt presented to this EDA yesterday for evaluation of SHOB but left prior to being seen by provider.  ?

## 2021-07-07 NOTE — ED Notes (Signed)
Pt verbalizes understanding of discharge instructions. Opportunity for questions and answers were provided. Pt discharged from the ED.   ?

## 2021-07-07 NOTE — Discharge Instructions (Signed)
Please follow up with your PCP for further evaluation of your ED visit today ? ?Continue taking your Lasix as prescribed.  ? ?You can also apply the hydrocortisone cream to your forearms to help with itching related to your rash.  ? ?Return to the ED for any new/worsening symptoms ?

## 2021-07-07 NOTE — ED Provider Triage Note (Signed)
Emergency Medicine Provider Triage Evaluation Note ? ?[name] , a 88 y.o. female  was evaluated in triage.  Pt complains of rash.  This just began about an hour or so ago.  States it is on her arms and it is itchy.  No new soaps, detergents, medications, etc. ? ?States she came in yesterday for evaluation of shortness of breath and lower extremity swelling at the insistence of her home health nurse.  States she had to leave prior to being seen.  She denies any worsening shortness of breath or chest pain.  She does have history of CHF.  No fever. ? ?Review of Systems  ?Positive: Rash, SOB ?Negative: fever ? ?Physical Exam  ?BP (!) 153/58 (BP Location: Right Arm)   Pulse (!) 59   Temp 97.9 ?F (36.6 ?C)   Resp 17   SpO2 100%  ? ?Gen:   Awake, no distress   ?Resp:  Normal effort  ?MSK:   Moves extremities without difficulty  ?Other:  2+ pitting edema BLE, faint rash noted to bilateral forearms ? ?Medical Decision Making  ?Medically screening exam initiated at 3:13 AM.  Appropriate orders placed.  [name] was informed that the remainder of the evaluation will be completed by another provider, this initial triage assessment does not replace that evaluation, and the importance of remaining in the ED until their evaluation is complete. ? ?Rash.  Never got evaluated yesterday for her lower extremity edema and shortness of breath.  Does appear to have some peripheral edema.  We will recheck EKG, labs, chest x-ray. ?  ?Garlon Hatchet, PA-C ?07/07/21 6283 ? ?

## 2021-07-09 DIAGNOSIS — I5023 Acute on chronic systolic (congestive) heart failure: Secondary | ICD-10-CM | POA: Diagnosis not present

## 2021-07-09 DIAGNOSIS — I130 Hypertensive heart and chronic kidney disease with heart failure and stage 1 through stage 4 chronic kidney disease, or unspecified chronic kidney disease: Secondary | ICD-10-CM | POA: Diagnosis not present

## 2021-07-09 DIAGNOSIS — I5032 Chronic diastolic (congestive) heart failure: Secondary | ICD-10-CM | POA: Diagnosis not present

## 2021-07-09 DIAGNOSIS — E1122 Type 2 diabetes mellitus with diabetic chronic kidney disease: Secondary | ICD-10-CM | POA: Diagnosis not present

## 2021-07-09 DIAGNOSIS — L308 Other specified dermatitis: Secondary | ICD-10-CM | POA: Diagnosis not present

## 2021-07-09 DIAGNOSIS — I509 Heart failure, unspecified: Secondary | ICD-10-CM | POA: Diagnosis not present

## 2021-07-09 DIAGNOSIS — F4381 Prolonged grief disorder: Secondary | ICD-10-CM | POA: Diagnosis not present

## 2021-07-13 ENCOUNTER — Other Ambulatory Visit: Payer: Self-pay | Admitting: *Deleted

## 2021-07-13 NOTE — Patient Outreach (Signed)
[facility] service [facility]) Care Management ? ?07/13/2021 ? ?[name] ?Sep 05, 1932 ?[id number] ? ?Successful telephone outreach call to patient. HIPAA identifiers obtained. Nurse called patient in response to an e-mail message that stated to call patient. Patient explained that she was going "crazy" due to an itchy rash and she was unable to schedule an appointment with her PCP today to be seen. Patient reports that she went to the ED on 07/06/21 for SOB and returned to the ED on 07/07/21 for SOB and rash. She was treated for fluid overload and rash. Her BNP was 271.8, she was given Lasix 40 mg IM, and Hydrocortisone cream was applied to her arms where the rash was located at that time. Patient explains that the rash is now worse and has migrated to her trunk and legs. She states her left hand is slightly swollen. Patient explains that her shortness of breath and leg swelling has greatly improved since she was treated with diuretics in the ED and states she has had hand swelling in the past. Patient denies any lip swelling, tongue swelling, throat swelling, difficulty swallowing, pronounce anxiety, wheezing, or difficulty breathing. Upon reviewing the chart with the patient, nurse discovered that Dr. Parke Simmers called into the patient's pharmacy prescriptions for Zyrtec and Hydralazine today after the patient called to schedule an appointment without success. Patient states she had an appointment with Dr. Parke Simmers on 07/09/21 where she was seen for the rash and Triamcinolone ointment was prescribe. Nurse called the patient's pharmacy to confirm that they received the prescriptions from Dr. Tedra Senegal office today and the pharmacist stated they would be ready for pickup by 1400. Patient reported that she would be able to pick up the prescriptions at that time. Nurse reviewed concerning symptoms of allergic reaction with the patient as stated above and advised for her to monitor her symptoms closely. Explaining that if her  rash continues to worsen or if she starts to experience acute systems as stated above that she needed to seek medical attention right away. Patient verbalized understanding and explained she felt comfortable at this time starting the medications prescribed today by her PCP to see if this improves her itch and rash. She confirmed that she would go the ED or Urgent care if her symptoms got worse. Patient did not have any further questions or concerns today and did confirm that she has this nurse's contact number to call her if needed.  ? ?Plan: RN Health Coach will call patient within the month of May. ? ?Blanchie Serve RN, BSN ?[facility] Care Management  ?RN Health Coach ?[phone] ?[email or web address] ? ?

## 2021-07-19 DIAGNOSIS — Z743 Need for continuous supervision: Secondary | ICD-10-CM | POA: Diagnosis not present

## 2021-07-19 DIAGNOSIS — R42 Dizziness and giddiness: Secondary | ICD-10-CM | POA: Diagnosis not present

## 2021-07-19 DIAGNOSIS — R404 Transient alteration of awareness: Secondary | ICD-10-CM | POA: Diagnosis not present

## 2021-07-19 DIAGNOSIS — L299 Pruritus, unspecified: Secondary | ICD-10-CM | POA: Diagnosis not present

## 2021-07-20 ENCOUNTER — Other Ambulatory Visit: Payer: Self-pay | Admitting: Physician Assistant

## 2021-07-20 DIAGNOSIS — I5032 Chronic diastolic (congestive) heart failure: Secondary | ICD-10-CM

## 2021-07-23 DIAGNOSIS — E1169 Type 2 diabetes mellitus with other specified complication: Secondary | ICD-10-CM | POA: Diagnosis not present

## 2021-07-23 DIAGNOSIS — L309 Dermatitis, unspecified: Secondary | ICD-10-CM | POA: Diagnosis not present

## 2021-07-23 DIAGNOSIS — I5023 Acute on chronic systolic (congestive) heart failure: Secondary | ICD-10-CM | POA: Diagnosis not present

## 2021-07-23 DIAGNOSIS — R110 Nausea: Secondary | ICD-10-CM | POA: Diagnosis not present

## 2021-07-23 DIAGNOSIS — I10 Essential (primary) hypertension: Secondary | ICD-10-CM | POA: Diagnosis not present

## 2021-08-03 ENCOUNTER — Ambulatory Visit: Payer: Medicare Other | Admitting: *Deleted

## 2021-08-05 DIAGNOSIS — M79605 Pain in left leg: Secondary | ICD-10-CM | POA: Diagnosis not present

## 2021-08-05 DIAGNOSIS — R609 Edema, unspecified: Secondary | ICD-10-CM | POA: Diagnosis not present

## 2021-08-05 DIAGNOSIS — R42 Dizziness and giddiness: Secondary | ICD-10-CM | POA: Diagnosis not present

## 2021-08-08 DIAGNOSIS — I130 Hypertensive heart and chronic kidney disease with heart failure and stage 1 through stage 4 chronic kidney disease, or unspecified chronic kidney disease: Secondary | ICD-10-CM | POA: Diagnosis not present

## 2021-08-08 DIAGNOSIS — E1122 Type 2 diabetes mellitus with diabetic chronic kidney disease: Secondary | ICD-10-CM | POA: Diagnosis not present

## 2021-08-09 DIAGNOSIS — R600 Localized edema: Secondary | ICD-10-CM | POA: Diagnosis not present

## 2021-08-09 DIAGNOSIS — E1169 Type 2 diabetes mellitus with other specified complication: Secondary | ICD-10-CM | POA: Diagnosis not present

## 2021-08-09 DIAGNOSIS — I10 Essential (primary) hypertension: Secondary | ICD-10-CM | POA: Diagnosis not present

## 2021-08-09 DIAGNOSIS — I509 Heart failure, unspecified: Secondary | ICD-10-CM | POA: Diagnosis not present

## 2021-08-09 DIAGNOSIS — E7800 Pure hypercholesterolemia, unspecified: Secondary | ICD-10-CM | POA: Diagnosis not present

## 2021-08-09 DIAGNOSIS — I1310 Hypertensive heart and chronic kidney disease without heart failure, with stage 1 through stage 4 chronic kidney disease, or unspecified chronic kidney disease: Secondary | ICD-10-CM | POA: Diagnosis not present

## 2021-08-09 DIAGNOSIS — I5032 Chronic diastolic (congestive) heart failure: Secondary | ICD-10-CM | POA: Diagnosis not present

## 2021-08-09 DIAGNOSIS — N183 Chronic kidney disease, stage 3 unspecified: Secondary | ICD-10-CM | POA: Diagnosis not present

## 2021-08-09 DIAGNOSIS — E039 Hypothyroidism, unspecified: Secondary | ICD-10-CM | POA: Diagnosis not present

## 2021-08-09 DIAGNOSIS — M75101 Unspecified rotator cuff tear or rupture of right shoulder, not specified as traumatic: Secondary | ICD-10-CM | POA: Diagnosis not present

## 2021-08-09 DIAGNOSIS — M130 Polyarthritis, unspecified: Secondary | ICD-10-CM | POA: Diagnosis not present

## 2021-08-12 ENCOUNTER — Other Ambulatory Visit: Payer: Self-pay | Admitting: *Deleted

## 2021-08-12 NOTE — Patient Instructions (Signed)
Visit Information ? ?Thank you for taking time to visit with me today. Please don't hesitate to contact me if I can be of assistance to you before our next scheduled telephone appointment. ? ?Following are the goals we discussed today:  ? ?Take all medications as prescribed ?Attend all scheduled provider appointments ?Call pharmacy for medication refills 3-7 days in advance of running out of medications ?Call provider office for new concerns or questions  ?call office if I gain more than 2 pounds in one day or 5 pounds in one week ?keep legs up while sitting ?use salt in moderation ?weigh myself daily ?follow rescue plan if symptoms flare-up ?eat more whole grains, fruits and vegetables, lean meats and healthy fats ?keep appointment with eye doctor ?check blood sugar at prescribed times: once daily ?check feet daily for cuts, sores or redness ?enter blood sugar readings and medication or insulin into daily log ?trim toenails straight across ?drink 6 to 8 glasses of water each day ?fill half of plate with vegetables ?manage portion size ?wear comfortable, well-fitting shoes ?check blood pressure daily ?write blood pressure results in a log or diary ?call doctor for signs and symptoms of high blood pressure ?take medications for blood pressure exactly as prescribed ?report new symptoms to your doctor ?eat more whole grains, fruits and vegetables, lean meats and healthy fats ?Continue to limit sodium, sugar, and carbohydrates in your diet ?continue to walk and do PT exercises  ?Continue to monitor your weight, B/P, and blood sugar daily ? ? ?The patient verbalized understanding of instructions, educational materials, and care plan provided today and agreed to receive a mailed copy of patient instructions, educational materials, and care plan.  ? ?Telephone follow up appointment with care management team member scheduled for: July ? ?Blanchie Serve RN, BSN ?Nurse Health Coach ?[facility] ?[phone] ?[email or web address] ? ? ?  ?

## 2021-08-12 NOTE — Patient Outreach (Signed)
[facility]) Care Management ? ?08/12/2021 ? ?[name] ?Dec 23, 1932 ?[id number] ? ?[facility]) Care Management ?RN Health Coach Note ? ? ?08/12/2021 ?Name:  [name] MRN:  [id number] DOB:  May 03, 1932 ? ?Summary: ?Patient reports that she has had multiple family loses within the last couple of months. Nurse offered sincere condolences and discussed grief counseling. Patient explained that she is well supported by her family who is extremely close. Patient reports that she had a PCP visit 08/06/21. Dr. Parke Simmers was concerned about the swelling in her legs and noted that her B/P was high. She has a cardiology appointment scheduled for 09/14/21. Patient reports that Dr. Parke Simmers is going to give her a follow up call on 08/13/21 to inquire about what her weight and B/P values have been over the week. Patient states that the swelling in her legs is better, she has had no weight gain, and her B/P is better; patient feels her B/P was elevated due to emotional stress which is better. Patient reports that her diabetes remains under control. She takes her blood sugar daily and her last A1c was 6.6. Patient did not have any further questions or concerns today and did confirm that she has this nurse's contact number to call her if needed.  ? ?Recommendations/Changes made from today's visit: ?Call provider office for new concerns or questions  ?call office if I gain more than 2 pounds in one day or 5 pounds in one week ?Continue to limit sodium, sugar, carbohydrates in your diet ?Continue to walk and do PT exercises  ?Continue to monitor your weight, B/P, and blood sugar daily ? ?Subjective: ?[name] is an 88 y.o. year old female who is a primary patient of Renaye Rakers, MD. The care management team was consulted for assistance with care management and/or care coordination needs.   ? ?RN Health Coach completed Telephone Visit today.  ? ?Objective: ? ?Medications Reviewed Today   ? ? Reviewed  by Wanda Plump, RN (Registered Nurse) on 08/12/21 at 1359  Med List Status: <None>  ? ?Medication Order Taking? Sig Documenting Provider Last Dose Status Informant  ?acetaminophen (TYLENOL) 500 MG tablet [id number] Yes Take 500 mg by mouth every 6 (six) hours as needed for headache (pain). [provider] Taking Active Multiple Informants  ?allopurinol (ZYLOPRIM) 100 MG tablet [id number] Yes Take 100 mg by mouth at bedtime.  [provider] Taking Active Multiple Informants  ?amLODipine (NORVASC) 5 MG tablet [id number] Yes Take 5 mg by mouth 2 (two) times daily. [provider] Taking Active   ?atorvastatin (LIPITOR) 20 MG tablet [id number] Yes TAKE 1 TABLET BY MOUTH  DAILY Tonny Bollman, MD Taking Active   ?azelastine (OPTIVAR) 0.05 % ophthalmic solution [id number] Yes Place 1 drop into both eyes 2 (two) times daily. [provider] Taking Active Multiple Informants  ?         ?Med Note Katrinka Blazing, JEFFREY W   Mon Nov 23, 2015  1:44 AM)    ?betamethasone acetate-betamethasone sodium phosphate (CELESTONE) injection 3 mg [id number]   Felecia Shelling, DPM  Active   ?         ?Med Note Aurther Loft, ASHELEY Justice Rocher May 07, 2018 10:26 AM)    ?carvedilol (COREG) 12.5 MG tablet [id number] Yes Take 1 tablet (12.5 mg total) by mouth 2 (two) times daily. Hillis Range, MD Taking Active   ?clopidogrel (PLAVIX) 75 MG tablet [id number] Yes TAKE 1 TABLET BY MOUTH  DAILY Allred, Fayrene Fearing, MD Taking Active   ?dapagliflozin propanediol (FARXIGA) 5 MG TABS tablet [id number] Yes Take 5 mg by mouth daily. [provider] Taking Active Self  ?ENTRESTO 97-103 MG [id number] Yes TAKE 1 TABLET BY MOUTH TWICE  DAILY Tonny Bollman, MD Taking Active   ?furosemide (LASIX) 40 MG tablet [id number] Yes TAKE 1 TABLET BY MOUTH DAILY ON  MONDAY WEDNESDAY AND FRIDAY WITH POTASSIUM. Please call and make an appt with Dr. Excell Seltzer for additional refills [phone]. Thank you. First attempt. Tonny Bollman, MD Taking Active    ?insulin NPH Human (HUMULIN N,NOVOLIN N) 100 UNIT/ML injection [id number] No Inject 5-45 Units into the skin See admin instructions. Inject 45 units subcutaneously every morning and 10 units at night  ?Patient not taking: Reported on 05/04/2021  ? [provider] Not Taking Active   ?         ?Med Note Garner Nash, MICHELE   Tue Jul 21, 2020 11:48 AM)    ?Multiple Vitamin (MULTIVITAMIN WITH MINERALS) TABS tablet [id number] Yes Take 1 tablet by mouth at bedtime.  [provider] Taking Active Multiple Informants  ?nitroGLYCERIN (NITROSTAT) 0.4 MG SL tablet [id number] Yes DISSOLVE ONE TABLET UNDER THE TONGUE EVERY 5 MINUTES AS NEEDED FOR CHEST PAIN.  DO NOT EXCEED A TOTAL OF 3 DOSES IN 15 MINUTES Glory Buff, Roselyn Bering, NP Taking Active   ?ONE TOUCH ULTRA TEST test strip [id number] Yes 1 each by Other route daily as needed (blood sugar).  [provider] Taking Active Multiple Informants  ?         ?Med Note [facility], RACHEL A   Sun Aug 03, 2014  9:23 AM)   ?  ?oxyCODONE-acetaminophen (PERCOCET/ROXICET) 5-325 MG tablet [id number] No Take 1 tablet by mouth daily as needed (pain).  ?Patient not taking: Reported on 05/04/2021  ? [provider] Not Taking Active   ?         ?Med Note Hoyt Koch, Leydi Winstead A   Mon Feb 01, 2021 12:13 PM) completed  ?pantoprazole (PROTONIX) 40 MG tablet [id number] Yes Take 40 mg by mouth daily. [provider] Taking Active   ?potassium chloride (KLOR-CON M) 10 MEQ tablet [id number] Yes TAKE 1 TABLET BY MOUTH  MONDAY WEDNESDAY AND FRIDAY WITH Jim Like, MD Taking Active   ?tobramycin (TOBREX) 0.3 % ophthalmic solution [id number] No Place 1 drop into the left eye 4 (four) times daily.  ?Patient not taking: Reported on 08/12/2021  ? [provider] Not Taking Active   ?         ?Med Note Hoyt Koch, Ninamarie Keel A   Thu Aug 12, 2021  1:59 PM) completed  ? ?  ?  ? ?  ? ? ? ?SDOH:  (Social Determinants of Health) assessments and interventions performed: SDOH assessments  completed today and documented in the Epic system. ? ?Care Plan ? ?Review of patient past medical history, allergies, medications, health status, including review of consultants reports, laboratory and other test data, was performed as part of comprehensive evaluation for care management services.  ? ?Care Plan : RN Care Manager Plan of Care  ?Updates made by Wanda Plump, RN since 08/12/2021 12:00 AM  ?  ? ?Problem: Knowledge Deficit Related to Diabetes, CHF, Hypertension   ?Priority: High  ?  ? ?Long-Range Goal: Development of Plan of Care for the Management of Diabetes, CHF, Hypertension   ?Start Date: 05/04/2021  ?Expected End Date: 05/10/2021  ?Priority: High  ?Note:   ?Current Barriers:  ?  Chronic Disease Management support and education needs related to CHF, HTN, and DMII  ? ?RNCM Clinical Goal(s):  ?Patient will demonstrate Ongoing adherence to prescribed treatment plan for CHF, HTN, and DMII as evidenced by maintaining A1c 7 or less; B/P < 150/90; continuation of weighing daily ?continue to work with RN Care Manager to address care management and care coordination needs related to  CHF, HTN, and DMII as evidenced by adherence to CM Team Scheduled appointments through collaboration with RN Care manager, provider, and care team.  ? ?Interventions: ?Inter-disciplinary care team collaboration (see longitudinal plan of care) ?Evaluation of current treatment plan related to  self management and patient's adherence to plan as established by provider ? ? ?Heart Failure Interventions:  (Status:  Goal on track:  Yes.) Long Term Goal ?Basic overview and discussion of pathophysiology of Heart Failure reviewed ?Provided education on low sodium diet ?Reviewed Heart Failure Action Plan in depth and provided written copy ?Assessed need for readable accurate scales in home ?Provided education about placing scale on hard, flat surface ?Advised patient to weigh each morning after emptying bladder ?Discussed importance of daily  weight and advised patient to weigh and record daily ?Provided patient with education about the role of exercise in the management of heart failure ?Screening for signs and symptoms of depression related to chron

## 2021-08-13 DIAGNOSIS — I10 Essential (primary) hypertension: Secondary | ICD-10-CM | POA: Diagnosis not present

## 2021-08-13 DIAGNOSIS — R600 Localized edema: Secondary | ICD-10-CM | POA: Diagnosis not present

## 2021-08-31 ENCOUNTER — Other Ambulatory Visit: Payer: Self-pay | Admitting: Cardiovascular Disease

## 2021-08-31 DIAGNOSIS — E1122 Type 2 diabetes mellitus with diabetic chronic kidney disease: Secondary | ICD-10-CM | POA: Diagnosis not present

## 2021-08-31 DIAGNOSIS — I1310 Hypertensive heart and chronic kidney disease without heart failure, with stage 1 through stage 4 chronic kidney disease, or unspecified chronic kidney disease: Secondary | ICD-10-CM | POA: Diagnosis not present

## 2021-08-31 DIAGNOSIS — Z0000 Encounter for general adult medical examination without abnormal findings: Secondary | ICD-10-CM | POA: Diagnosis not present

## 2021-08-31 DIAGNOSIS — N1832 Chronic kidney disease, stage 3b: Secondary | ICD-10-CM | POA: Diagnosis not present

## 2021-09-05 ENCOUNTER — Other Ambulatory Visit: Payer: Self-pay | Admitting: Cardiovascular Disease

## 2021-09-05 DIAGNOSIS — E785 Hyperlipidemia, unspecified: Secondary | ICD-10-CM

## 2021-09-14 ENCOUNTER — Ambulatory Visit: Payer: Medicare Other | Admitting: Physician Assistant

## 2021-09-14 ENCOUNTER — Encounter: Payer: Self-pay | Admitting: Physician Assistant

## 2021-09-14 VITALS — BP 170/60 | HR 60 | Ht 67.0 in | Wt 191.8 lb

## 2021-09-14 DIAGNOSIS — E785 Hyperlipidemia, unspecified: Secondary | ICD-10-CM

## 2021-09-14 DIAGNOSIS — I10 Essential (primary) hypertension: Secondary | ICD-10-CM

## 2021-09-14 DIAGNOSIS — I5032 Chronic diastolic (congestive) heart failure: Secondary | ICD-10-CM | POA: Diagnosis not present

## 2021-09-14 DIAGNOSIS — E1159 Type 2 diabetes mellitus with other circulatory complications: Secondary | ICD-10-CM

## 2021-09-14 DIAGNOSIS — N1832 Chronic kidney disease, stage 3b: Secondary | ICD-10-CM

## 2021-09-14 DIAGNOSIS — I2510 Atherosclerotic heart disease of native coronary artery without angina pectoris: Secondary | ICD-10-CM | POA: Diagnosis not present

## 2021-09-14 MED ORDER — SPIRONOLACTONE 25 MG PO TABS: 25.0000 mg | ORAL_TABLET | Freq: Every day | ORAL | 3 refills | Status: DC

## 2021-09-14 NOTE — Assessment & Plan Note (Signed)
Managed by primary care. 

## 2021-09-14 NOTE — Progress Notes (Signed)
Cardiology Office Note:    Date:  09/14/2021   ID:  [name], DOB February 09, 1933, MRN [id number]  PCP:  Lucianne Lei, MD  [facility] Providers Cardiologist:  Sherren Mocha, MD Cardiology APP:  Liliane Shi, PA-C  Electrophysiologist:  Thompson Grayer, MD    Referring MD: Lucianne Lei, MD   Chief Complaint:  Follow-up for CAD, CHF    Patient Profile: Coronary artery disease  S/p CABG in 2011 Cardiac catheterization 8/15:  Patent Grafts SSS s/p PPM (HFpEF) heart failure with preserved ejection fraction  Hypertension Hyperlipidemia  Diabetes mellitus  Chronic kidney disease  Peripheral arterial disease  GERD/Barrett's esophagus Carotid artery disease [place] 10/19: Bilateral ICA 1-39 Hypothyroidism  Prior CV Studies: Carotid US 01/29/2018 Bilateral ICA 1-39   Myoview 11/24/2015 EF 60, no ischemia, no infarct; low risk   Echocardiogram 07/13/2015 EF 55-60, normal wall motion, trivial AI, MAC, moderate LAE, PASP 35   LHC 12/02/13 LM:   patent. LAD:  95% proximal in-stent restenosis. Diffuse disease throughout the remainder of the vessel that in the left ventricular apex. Reflux into the mammary is noted to reflux into the diagonal graft is also noted. LCx:  totally occluded.  40% stenosis proximal to the first obtuse marginal; the distal circumflex is totally occluded. RCA:  totally occluded distally. SVG to OM 1 and OM 2 is widely patent. SVG to diagonal #1 is widely patent. SVG to PDA is widely patent. LIMA to LAD is widely patent EF:  60%.  History of Present Illness:   JANNE FAULK is a 88 y.o. female with the above problem list.  She was last seen by Dr. Rayann Heman in December 2022.  She presented to the emergency room March 29 with shortness of breath.  BNP was slightly elevated she had vascular congestion on chest x-ray.  She was given a dose of IV furosemide and discharged home.  She returns for follow-up.  She returns for follow-up.  She is here alone.  She  notes continued issues with excess fluid.  Her PCP increased her furosemide to 40 mg daily.  She has noted some improvement.  She continues to have shortness of breath with minimal exertion.  She has not had chest pain.  She has had some right-sided neck discomfort.  This does not seem to be positional or exertional.  She has not had syncope.  She sleeps on an incline chronically.       Past Medical History:  Diagnosis Date   Allergic rhinitis    Anemia    Anxiety    Barrett esophagus    CAD (coronary artery disease) 2009   a. Multivessel s/p PCI w/DES 2009 // b. s/p CABG 2011  //  c. LHC 8/15: pLAD 95 ISR, LCx 100, pOM1 40, dRCA 100, S-OM1/OM2 ok, S-D1 ok, S-PDA ok, L-LAD ok, EF 60%   Carotid artery disease ([place])    a. Carotid US 4/16: RICA 3-84%; LICA 53-64% >> FU 1 year  //  b. Carotid US 9/17: R 1-39%, L 40-59% >> FU 1 year   Chronic diastolic heart failure (HCC)    CKD (chronic kidney disease), stage II    GFR 60-89 ml/min   Depression    Disc disease, degenerative, cervical    Diverticulosis    Gastroparesis    GERD (gastroesophageal reflux disease)    Gout    H/O hiatal hernia    Helicobacter pylori gastritis    History of echocardiogram    a. Echo 11/13:  EF 55% to 60%. Grade 2 diastolic dysfunction, MAC, trivial MR, mild LAE, normal RVSF, mild RAE, PASP 39 mmHg  //  b. Echo 4/17: EF 55-60%, normal wall motion, trivial AI, MAC, moderate LAE, mild RVE, PASP 35 mmHg   History of thrombocytopenia    HTN (hypertension)    Hyperlipidemia    Hypothyroidism    LBP (low back pain)    Lumbar disc disease/lumbar spinal stenosis   Morbid obesity (HCC)    Myocardial infarction ([place])    Osteoarthritis    Osteopenia    PVD (peripheral vascular disease) (HCC)    Sick sinus syndrome ([place])    MDT Dual-chamber PPM implant 02/2012   Type II or unspecified type diabetes mellitus without mention of complication, not stated as uncontrolled    Current Medications: Current Meds  Medication  Sig   acetaminophen (TYLENOL) 500 MG tablet Take 500 mg by mouth every 6 (six) hours as needed for headache (pain).   allopurinol (ZYLOPRIM) 100 MG tablet Take 100 mg by mouth at bedtime.    amLODipine (NORVASC) 5 MG tablet Take 5 mg by mouth 2 (two) times daily.   atorvastatin (LIPITOR) 20 MG tablet TAKE 1 TABLET BY MOUTH ONCE DAILY   azelastine (OPTIVAR) 0.05 % ophthalmic solution Place 1 drop into both eyes 2 (two) times daily.   carvedilol (COREG) 12.5 MG tablet Take 1 tablet (12.5 mg total) by mouth 2 (two) times daily.   clopidogrel (PLAVIX) 75 MG tablet TAKE 1 TABLET BY MOUTH  DAILY   ENTRESTO 97-103 MG TAKE 1 TABLET BY MOUTH TWICE  DAILY   furosemide (LASIX) 40 MG tablet TAKE 1 TABLET BY MOUTH DAILY ON  MONDAY, WEDNESDAY, AND FRIDAY  WITH POTASSIUM.   Multiple Vitamin (MULTIVITAMIN WITH MINERALS) TABS tablet Take 1 tablet by mouth at bedtime.    nitroGLYCERIN (NITROSTAT) 0.4 MG SL tablet DISSOLVE ONE TABLET UNDER THE TONGUE EVERY 5 MINUTES AS NEEDED FOR CHEST PAIN.  DO NOT EXCEED A TOTAL OF 3 DOSES IN 15 MINUTES   ONE TOUCH ULTRA TEST test strip 1 each by Other route daily as needed (blood sugar).    pantoprazole (PROTONIX) 40 MG tablet Take 40 mg by mouth daily.   spironolactone (ALDACTONE) 25 MG tablet Take 1 tablet (25 mg total) by mouth daily.   tobramycin (TOBREX) 0.3 % ophthalmic solution Place 1 drop into the left eye 4 (four) times daily.   [DISCONTINUED] potassium chloride (KLOR-CON M) 10 MEQ tablet TAKE 1 TABLET BY MOUTH  MONDAY WEDNESDAY AND FRIDAY WITH LASIX   Current Facility-Administered Medications for the 09/14/21 encounter (Office Visit) with Richardson Dopp T, PA-C  Medication   betamethasone acetate-betamethasone sodium phosphate (CELESTONE) injection 3 mg    Allergies:   Ciprofloxacin, Codeine, Hydrocodone, Penicillins, Shellfish allergy, Diltiazem hcl, Sulfonamide derivatives, Morphine and related, Lovastatin, and Metformin   Social History   Tobacco Use   Smoking  status: Former   Smokeless tobacco: Never   Tobacco comments:    quit 30 yrs ago  Vaping Use   Vaping Use: Never used  Substance Use Topics   Alcohol use: No   Drug use: No    Family Hx: The patient's family history includes Coronary artery disease in an other family member; Diabetes in her mother; Heart attack in her mother; Hypertension in her mother; Stroke in her father.  Review of Systems  Gastrointestinal:  Negative for hematochezia.  Genitourinary:  Negative for hematuria.    EKGs/Labs/Other Test Reviewed:    EKG:  EKG is not ordered today.  The ekg ordered today demonstrates n/a  Recent Labs: 07/07/2021: B Natriuretic Peptide 271.8; BUN 30; Creatinine, Ser 1.24; Hemoglobin 11.3; Platelets 172; Potassium 3.8; Sodium 140   Recent Lipid Panel No results for input(s): CHOL, TRIG, HDL, VLDL, LDLCALC, LDLDIRECT in the last 8760 hours.   Labs obtained through [place] - personally reviewed and interpreted: 08/09/2021: Total cholesterol 122, HDL 68, LDL 39, triglycerides 71, Hgb 10.9, creatinine 1.35, K+ 4.2, ALT 19, TSH 3.51  Risk Assessment/Calculations/Metrics:         HYPERTENSION CONTROL Vitals:   09/14/21 1450 09/14/21 1547  BP: (!) 168/78 (!) 170/60    The patient's blood pressure is elevated above target today.  The following intervention was performed to address the patient's elevated BP:  - A new medication was prescribed today.       Physical Exam:    VS:  BP (!) 170/60   Pulse 60   Ht _0  (1.702 m)   Wt 191 lb 12.8 oz (87 kg)   SpO2 97%   BMI 30.04 kg/m     Wt Readings from Last 3 Encounters:  09/14/21 191 lb 12.8 oz (87 kg)  04/06/21 202 lb 9.6 oz (91.9 kg)  07/21/20 201 lb (91.2 kg)    Constitutional:      Appearance: Healthy appearance. Not in distress.  Neck:     Vascular: No JVR.  Pulmonary:     Effort: Pulmonary effort is normal.     Breath sounds: No wheezing. No rales.  Cardiovascular:     Normal rate. Regular rhythm. Normal S1.  Normal S2.      Murmurs: There is a grade 1/6 systolic murmur at the ULSB.  Edema:    Peripheral edema present.    Pretibial: bilateral 1+ edema of the pretibial area. Abdominal:     Palpations: Abdomen is soft.  Skin:    General: Skin is warm and dry.  Neurological:     Mental Status: Alert and oriented to person, place and time.     Cranial Nerves: Cranial nerves are intact.        ASSESSMENT & PLAN:   (HFpEF) heart failure with preserved ejection fraction ([place]) She continues to have some volume excess on exam.  She is NYHA III.  She could not tolerate Farxiga secondary to yeast infection.  She is on max dose Entresto 97/103 mg twice daily.  Continue current dose of furosemide 40 mg daily.  Discontinue potassium.  Start spironolactone 25 mg daily.  Obtain BMET next week and 1 week after.  Follow-up in 3 months.  Essential hypertension Trolled.  Continue amlodipine 5 mg twice daily, carvedilol 12.5 mg twice daily, Entresto 97/103 mg twice daily.  Start spironolactone 25 mg daily as noted.  Follow-up in 3 months.  Hyperlipidemia LDL goal <70 Lipids optimal.  Continue atorvastatin 20 mg daily.  CAD (coronary artery disease) History of CABG in 2011.  Cardiac catheterization in 2015 demonstrated patent grafts.  Myoview in 2017 was low risk.  She is not having any exertional angina.  Continue clopidogrel 75 mg daily, atorvastatin 20 mg daily.  Type 2 diabetes mellitus with vascular disease ([place]) Managed by primary care.  CKD (chronic kidney disease), stage III (HCC) Recent creatinine has been stable.  Spironolactone will be started to help better manage her blood pressure and heart failure.  She should be able to tolerate this.  However, if her creatinine starts to increase, we will need to stop  it.          Dispo:  Return in about 3 months (around 12/15/2021) for Routine Follow Up, w/ Dr. Burt Knack, or Richardson Dopp, PA-C.   Medication Adjustments/Labs and Tests Ordered: Current  medicines are reviewed at length with the patient today.  Concerns regarding medicines are outlined above.  Tests Ordered: Orders Placed This Encounter  Procedures   Basic metabolic panel   Basic metabolic panel   Medication Changes: Meds ordered this encounter  Medications   spironolactone (ALDACTONE) 25 MG tablet    Sig: Take 1 tablet (25 mg total) by mouth daily.    Dispense:  90 tablet    Refill:  3   Signed, Richardson Dopp, PA-C  09/14/2021 3:54 PM    [facility] [address], [place], [place]  [address] Phone: [phone]; Fax: [phone]

## 2021-09-14 NOTE — Assessment & Plan Note (Signed)
She continues to have some volume excess on exam.  She is NYHA III.  She could not tolerate Farxiga secondary to yeast infection.  She is on max dose Entresto 97/103 mg twice daily.  Continue current dose of furosemide 40 mg daily.  Discontinue potassium.  Start spironolactone 25 mg daily.  Obtain BMET next week and 1 week after.  Follow-up in 3 months.

## 2021-09-14 NOTE — Assessment & Plan Note (Signed)
History of CABG in 2011.  Cardiac catheterization in 2015 demonstrated patent grafts.  Myoview in 2017 was low risk.  She is not having any exertional angina.  Continue clopidogrel 75 mg daily, atorvastatin 20 mg daily.

## 2021-09-14 NOTE — Assessment & Plan Note (Addendum)
Lipids optimal.  Continue atorvastatin 20 mg daily. 

## 2021-09-14 NOTE — Patient Instructions (Addendum)
Medication Instructions:  Your physician has recommended you make the following change in your medication:   STOP Potassium  START Spironolactone 25 mg taking 1 daily    *If you need a refill on your cardiac medications before your next appointment, please call your pharmacy*   Lab Work: 1 WEEK:  BMET  2 WEEKS:  BMET  If you have labs (blood work) drawn today and your tests are completely normal, you will receive your results only by: MyChart Message (if you have MyChart) OR A paper copy in the mail If you have any lab test that is abnormal or we need to change your treatment, we will call you to review the results.   Testing/Procedures: None ordered   Follow-Up: At [facility], you and your health needs are our priority.  As part of our continuing mission to provide you with exceptional heart care, we have created designated Provider Care Teams.  These Care Teams include your primary Cardiologist (physician) and Advanced Practice Providers (APPs -  Physician Assistants and Nurse Practitioners) who all work together to provide you with the care you need, when you need it.  We recommend signing up for the patient portal called "MyChart".  Sign up information is provided on this After Visit Summary.  MyChart is used to connect with patients for Virtual Visits (Telemedicine).  Patients are able to view lab/test results, encounter notes, upcoming appointments, etc.  Non-urgent messages can be sent to your provider as well.   To learn more about what you can do with MyChart, go to [email or web address].    Your next appointment:   3 month(s)  The format for your next appointment:   In Person  Provider:   Tonny Bollman, MD  or Tereso Newcomer, PA-C         Other Instructions   Important Information About Sugar

## 2021-09-14 NOTE — Assessment & Plan Note (Addendum)
Recent creatinine has been stable.  Spironolactone will be started to help better manage her blood pressure and heart failure.  She should be able to tolerate this.  However, if her creatinine starts to increase, we will need to stop it.

## 2021-09-14 NOTE — Assessment & Plan Note (Signed)
Trolled.  Continue amlodipine 5 mg twice daily, carvedilol 12.5 mg twice daily, Entresto 97/103 mg twice daily.  Start spironolactone 25 mg daily as noted.  Follow-up in 3 months.

## 2021-09-15 DIAGNOSIS — H353221 Exudative age-related macular degeneration, left eye, with active choroidal neovascularization: Secondary | ICD-10-CM | POA: Diagnosis not present

## 2021-09-15 DIAGNOSIS — H35371 Puckering of macula, right eye: Secondary | ICD-10-CM | POA: Diagnosis not present

## 2021-09-15 DIAGNOSIS — E113293 Type 2 diabetes mellitus with mild nonproliferative diabetic retinopathy without macular edema, bilateral: Secondary | ICD-10-CM | POA: Diagnosis not present

## 2021-09-15 DIAGNOSIS — H353111 Nonexudative age-related macular degeneration, right eye, early dry stage: Secondary | ICD-10-CM | POA: Diagnosis not present

## 2021-09-21 ENCOUNTER — Other Ambulatory Visit: Payer: Medicare Other

## 2021-09-21 DIAGNOSIS — I2510 Atherosclerotic heart disease of native coronary artery without angina pectoris: Secondary | ICD-10-CM

## 2021-09-21 DIAGNOSIS — I10 Essential (primary) hypertension: Secondary | ICD-10-CM

## 2021-09-21 DIAGNOSIS — I5032 Chronic diastolic (congestive) heart failure: Secondary | ICD-10-CM

## 2021-09-21 DIAGNOSIS — E785 Hyperlipidemia, unspecified: Secondary | ICD-10-CM | POA: Diagnosis not present

## 2021-09-21 DIAGNOSIS — N1832 Chronic kidney disease, stage 3b: Secondary | ICD-10-CM

## 2021-09-21 DIAGNOSIS — E1159 Type 2 diabetes mellitus with other circulatory complications: Secondary | ICD-10-CM

## 2021-09-21 LAB — BASIC METABOLIC PANEL
BUN/Creatinine Ratio: 22 (ref 12–28)
BUN: 30 mg/dL — ABNORMAL HIGH (ref 8–27)
CO2: 24 mmol/L (ref 20–29)
Calcium: 9.5 mg/dL (ref 8.7–10.3)
Chloride: 103 mmol/L (ref 96–106)
Creatinine, Ser: 1.36 mg/dL — ABNORMAL HIGH (ref 0.57–1.00)
Glucose: 185 mg/dL — ABNORMAL HIGH (ref 70–99)
Potassium: 4.4 mmol/L (ref 3.5–5.2)
Sodium: 140 mmol/L (ref 134–144)
eGFR: 38 mL/min/{1.73_m2} — ABNORMAL LOW (ref >59)

## 2021-09-22 ENCOUNTER — Telehealth: Payer: Self-pay

## 2021-09-22 NOTE — Telephone Encounter (Signed)
-----   Message from Beatrice Lecher, [place] sent at 09/22/2021  1:14 PM EDT ----- Creatinine stable.  K+ normal. PLAN:  -Continue current medications/treatment plan and follow up as scheduled.  Tereso Newcomer, PA-C    09/22/2021 1:14 PM

## 2021-09-22 NOTE — Telephone Encounter (Signed)
Spoke with pt and advised of lab results per Tereso Newcomer, PA-C.  Pt verbalizes understanding and thanked Charity fundraiser for the phone call.

## 2021-09-28 ENCOUNTER — Other Ambulatory Visit: Payer: Medicare Other

## 2021-09-28 DIAGNOSIS — I5032 Chronic diastolic (congestive) heart failure: Secondary | ICD-10-CM | POA: Diagnosis not present

## 2021-09-28 DIAGNOSIS — E785 Hyperlipidemia, unspecified: Secondary | ICD-10-CM | POA: Diagnosis not present

## 2021-09-28 DIAGNOSIS — E1159 Type 2 diabetes mellitus with other circulatory complications: Secondary | ICD-10-CM

## 2021-09-28 DIAGNOSIS — N1832 Chronic kidney disease, stage 3b: Secondary | ICD-10-CM

## 2021-09-28 DIAGNOSIS — I10 Essential (primary) hypertension: Secondary | ICD-10-CM

## 2021-09-28 DIAGNOSIS — I2510 Atherosclerotic heart disease of native coronary artery without angina pectoris: Secondary | ICD-10-CM

## 2021-09-29 ENCOUNTER — Telehealth: Payer: Self-pay | Admitting: *Deleted

## 2021-09-29 DIAGNOSIS — Z79899 Other long term (current) drug therapy: Secondary | ICD-10-CM

## 2021-09-29 LAB — BASIC METABOLIC PANEL
BUN/Creatinine Ratio: 24 (ref 12–28)
BUN: 36 mg/dL — ABNORMAL HIGH (ref 8–27)
CO2: 23 mmol/L (ref 20–29)
Calcium: 8.9 mg/dL (ref 8.7–10.3)
Chloride: 105 mmol/L (ref 96–106)
Creatinine, Ser: 1.5 mg/dL — ABNORMAL HIGH (ref 0.57–1.00)
Glucose: 161 mg/dL — ABNORMAL HIGH (ref 70–99)
Potassium: 4.4 mmol/L (ref 3.5–5.2)
Sodium: 143 mmol/L (ref 134–144)
eGFR: 34 mL/min/{1.73_m2} — ABNORMAL LOW (ref >59)

## 2021-09-29 MED ORDER — FUROSEMIDE 40 MG PO TABS: ORAL_TABLET | ORAL | 3 refills | Status: DC

## 2021-09-29 NOTE — Telephone Encounter (Signed)
-----   Message from Beatrice Lecher, [place] sent at 09/29/2021  3:36 PM EDT ----- Creatinine trending up.  K+ normal. Med list has her taking Lasix 40 mg once daily on Mon, Wed, Fri only.  PLAN:  - Decrease Lasix to 20 mg once daily on Mon, Wed, Fri - Weigh daily  Take extra Lasix 20 mg if wt increases > 3 lbs in 1 day - BMET 2 weeks.  Tereso Newcomer, PA-C    09/29/2021 3:34 PM

## 2021-10-05 ENCOUNTER — Ambulatory Visit (INDEPENDENT_AMBULATORY_CARE_PROVIDER_SITE_OTHER): Payer: Medicare Other

## 2021-10-05 DIAGNOSIS — I495 Sick sinus syndrome: Secondary | ICD-10-CM

## 2021-10-07 LAB — CUP PACEART REMOTE DEVICE CHECK
Battery Impedance: 1972 Ohm
Battery Remaining Longevity: 31 mo
Battery Voltage: 2.74 V
Brady Statistic AP VP Percent: 89 %
Brady Statistic AP VS Percent: 0 %
Brady Statistic AS VP Percent: 11 %
Brady Statistic AS VS Percent: 0 %
Date Time Interrogation Session: 20230628170806
Implantable Lead Implant Date: 20131126
Implantable Lead Implant Date: 20131126
Implantable Lead Location: 753859
Implantable Lead Location: 753860
Implantable Lead Model: 5076
Implantable Lead Model: 5092
Implantable Pulse Generator Implant Date: 20131126
Lead Channel Impedance Value: 449 Ohm
Lead Channel Impedance Value: 476 Ohm
Lead Channel Pacing Threshold Amplitude: 0.5 V
Lead Channel Pacing Threshold Amplitude: 1 V
Lead Channel Pacing Threshold Pulse Width: 0.4 ms
Lead Channel Pacing Threshold Pulse Width: 0.4 ms
Lead Channel Setting Pacing Amplitude: 2 V
Lead Channel Setting Pacing Amplitude: 2.5 V
Lead Channel Setting Pacing Pulse Width: 0.4 ms
Lead Channel Setting Sensing Sensitivity: 2 mV

## 2021-10-15 ENCOUNTER — Other Ambulatory Visit: Payer: Medicare Other | Admitting: *Deleted

## 2021-10-15 DIAGNOSIS — Z79899 Other long term (current) drug therapy: Secondary | ICD-10-CM

## 2021-10-16 LAB — BASIC METABOLIC PANEL
BUN/Creatinine Ratio: 23 (ref 12–28)
BUN: 35 mg/dL — ABNORMAL HIGH (ref 8–27)
CO2: 22 mmol/L (ref 20–29)
Calcium: 9.3 mg/dL (ref 8.7–10.3)
Chloride: 105 mmol/L (ref 96–106)
Creatinine, Ser: 1.49 mg/dL — ABNORMAL HIGH (ref 0.57–1.00)
Glucose: 132 mg/dL — ABNORMAL HIGH (ref 70–99)
Potassium: 4.6 mmol/L (ref 3.5–5.2)
Sodium: 140 mmol/L (ref 134–144)
eGFR: 34 mL/min/{1.73_m2} — ABNORMAL LOW (ref >59)

## 2021-10-25 ENCOUNTER — Other Ambulatory Visit: Payer: Self-pay | Admitting: *Deleted

## 2021-10-25 NOTE — Patient Outreach (Signed)
[facility]) Care Management  10/25/2021  [name] May 20, 1932 [id number]  Unsuccessful attempt to contact patient and unable to leave VM duet to patient's VM is not set up. Dr. Parke Simmers has an RN Care Coordinator who is assigned to her office and this RN Coordinator will follow up with the patient's care.   Plan: RN Health Coach will close case and will transfer care to RN Care Coordinator.  Blanchie Serve RN, BSN [facility] Care Management  RN Health Coach [phone] [email or web address]

## 2021-10-26 NOTE — Progress Notes (Signed)
Remote pacemaker transmission.   

## 2021-11-08 ENCOUNTER — Encounter: Payer: Self-pay | Admitting: Cardiovascular Disease

## 2021-11-09 ENCOUNTER — Other Ambulatory Visit: Payer: Self-pay | Admitting: *Deleted

## 2021-11-09 DIAGNOSIS — B372 Candidiasis of skin and nail: Secondary | ICD-10-CM | POA: Diagnosis not present

## 2021-11-09 DIAGNOSIS — L299 Pruritus, unspecified: Secondary | ICD-10-CM | POA: Diagnosis not present

## 2021-11-09 NOTE — Patient Outreach (Addendum)
[facility]) Care Management  11/09/2021  KEIGHLEY DECKMAN [birth year] [id number]  Nurse answered incoming call from patient who states she is "itching all over" and she is unable to get an appointment with PCP until 12/05/21. Patient asked about going to urgent care and added that the medications she is using for the itch are not working. Nurse discussed that urgent care would be able to assist her with her itching and advised against her going to the ED. Explaining that her wait time would be much longer and the ED service would be much more expensive. Patient verbalized understanding and was appreciate for the assistance.  Blanchie Serve RN, BSN [facility] Care Management [facility] [phone] [email or web address]

## 2021-12-09 DIAGNOSIS — I130 Hypertensive heart and chronic kidney disease with heart failure and stage 1 through stage 4 chronic kidney disease, or unspecified chronic kidney disease: Secondary | ICD-10-CM | POA: Diagnosis not present

## 2021-12-09 DIAGNOSIS — I5032 Chronic diastolic (congestive) heart failure: Secondary | ICD-10-CM | POA: Diagnosis not present

## 2021-12-09 DIAGNOSIS — N1831 Chronic kidney disease, stage 3a: Secondary | ICD-10-CM | POA: Diagnosis not present

## 2021-12-09 DIAGNOSIS — E1122 Type 2 diabetes mellitus with diabetic chronic kidney disease: Secondary | ICD-10-CM | POA: Diagnosis not present

## 2021-12-14 ENCOUNTER — Ambulatory Visit: Payer: Self-pay

## 2021-12-14 NOTE — Patient Outreach (Signed)
  Care Coordination   12/14/2021 Name: [name] MRN: [id number] DOB: [birth year]   Care Coordination Outreach Attempts:  An unsuccessful telephone outreach was attempted for a scheduled appointment today.  Follow Up Plan:  Additional outreach attempts will be made to offer the patient care coordination information and services.   Encounter Outcome:  No Answer  Care Coordination Interventions Activated:  No   Care Coordination Interventions:  No, not indicated    Delsa Sale, RN, BSN, CCM Care Management Coordinator [facility] Care Management  Direct Phone: [phone]

## 2021-12-17 ENCOUNTER — Ambulatory Visit: Payer: Medicare Other | Admitting: Cardiovascular Disease

## 2021-12-29 ENCOUNTER — Ambulatory Visit: Payer: Medicare Other | Attending: Cardiovascular Disease | Admitting: Cardiovascular Disease

## 2021-12-29 ENCOUNTER — Encounter: Payer: Self-pay | Admitting: Cardiovascular Disease

## 2021-12-29 VITALS — BP 130/72 | HR 61 | Ht 67.0 in | Wt 198.4 lb

## 2021-12-29 DIAGNOSIS — I5032 Chronic diastolic (congestive) heart failure: Secondary | ICD-10-CM

## 2021-12-29 DIAGNOSIS — I10 Essential (primary) hypertension: Secondary | ICD-10-CM | POA: Diagnosis not present

## 2021-12-29 DIAGNOSIS — Z79899 Other long term (current) drug therapy: Secondary | ICD-10-CM | POA: Diagnosis not present

## 2021-12-29 DIAGNOSIS — I2510 Atherosclerotic heart disease of native coronary artery without angina pectoris: Secondary | ICD-10-CM

## 2021-12-29 DIAGNOSIS — N1832 Chronic kidney disease, stage 3b: Secondary | ICD-10-CM

## 2021-12-29 DIAGNOSIS — E782 Mixed hyperlipidemia: Secondary | ICD-10-CM | POA: Diagnosis not present

## 2021-12-29 LAB — BASIC METABOLIC PANEL
BUN/Creatinine Ratio: 26 (ref 12–28)
BUN: 38 mg/dL — ABNORMAL HIGH (ref 8–27)
CO2: 22 mmol/L (ref 20–29)
Calcium: 9.2 mg/dL (ref 8.7–10.3)
Chloride: 103 mmol/L (ref 96–106)
Creatinine, Ser: 1.48 mg/dL — ABNORMAL HIGH (ref 0.57–1.00)
Glucose: 159 mg/dL — ABNORMAL HIGH (ref 70–99)
Potassium: 4.5 mmol/L (ref 3.5–5.2)
Sodium: 140 mmol/L (ref 134–144)
eGFR: 34 mL/min/{1.73_m2} — ABNORMAL LOW (ref >59)

## 2021-12-29 MED ORDER — POTASSIUM CHLORIDE ER 10 MEQ PO TBCR: 10.0000 meq | EXTENDED_RELEASE_TABLET | Freq: Every day | ORAL | 3 refills | Status: DC

## 2021-12-29 MED ORDER — FUROSEMIDE 20 MG PO TABS: ORAL_TABLET | ORAL | 3 refills | Status: DC

## 2021-12-29 NOTE — Patient Instructions (Signed)
Medication Instructions:  TAKE Furosemide 40mg  daily for 2 days, then decrease to 20mg  daily thereafter START Potassium Chloride 10 mEq daily STOP Spironolactone *If you need a refill on your cardiac medications before your next appointment, please call your pharmacy*   Lab Work: BMET today, repeat again in 2-3 weeks (same day as ECHO) If you have labs (blood work) drawn today and your tests are completely normal, you will receive your results only by: [place] (if you have MyChart) OR A paper copy in the mail If you have any lab test that is abnormal or we need to change your treatment, we will call you to review the results.   Testing/Procedures: ECHO Your physician has requested that you have an echocardiogram. Echocardiography is a painless test that uses sound waves to create images of your heart. It provides your doctor with information about the size and shape of your heart and how well your heart's chambers and valves are working. This procedure takes approximately one hour. There are no restrictions for this procedure.  Follow-Up: At [facility], you and your health needs are our priority.  As part of our continuing mission to provide you with exceptional heart care, we have created designated Provider Care Teams.  These Care Teams include your primary Cardiologist (physician) and Advanced Practice Providers (APPs -  Physician Assistants and Nurse Practitioners) who all work together to provide you with the care you need, when you need it.  We recommend signing up for the patient portal called "MyChart".  Sign up information is provided on this After Visit Summary.  MyChart is used to connect with patients for Virtual Visits (Telemedicine).  Patients are able to view lab/test results, encounter notes, upcoming appointments, etc.  Non-urgent messages can be sent to your provider as well.   To learn more about what you can do with MyChart, go to [email or web address].     Your next appointment:   7 week(s)  The format for your next appointment:   In Person  Provider:   Lum Keas, or Kathlen Mody      Important Information About Sugar

## 2021-12-29 NOTE — Progress Notes (Signed)
Cardiology Office Note:    Date:  12/29/2021   ID:  [name], DOB Mar 22, 1933, MRN [id number]  PCP:  Lucianne Lei, MD   [place] Providers Cardiologist:  Sherren Mocha, MD Cardiology APP:  Liliane Shi, PA-C  Electrophysiologist:  Thompson Grayer, MD     Referring MD: Lucianne Lei, MD   Chief Complaint  Patient presents with   Shortness of Breath    History of Present Illness:    [name] is a 88 y.o. female with a hx of: Coronary artery disease  S/p CABG in 2011 Cardiac catheterization 8/15:  Patent Grafts SSS s/p PPM (HFpEF) heart failure with preserved ejection fraction  Hypertension Hyperlipidemia  Diabetes mellitus  Chronic kidney disease  Peripheral arterial disease  GERD/Barrett's esophagus Carotid artery disease [place] 10/19: Bilateral ICA 1-39 Hypothyroidism  At the time of her last visit, she was noted to have signs and symptoms of heart failure and volume overload.  She had been unable to tolerate SGLT2 inhibitors because of yeast infection.  She was started on spironolactone. She stopped spironolactone a 5-6days ago because of diarrhea and she states that she is doing better since then. States she was unable to control her bowels when she was taking it.  The patient has been more short of breath over recent months.  She feels like this is gotten worse since she stopped spironolactone.  She denies orthopnea or PND, but she does complain of bilateral leg swelling.  She is comfortable at rest but short of breath with any low-level activity.  Past Medical History:  Diagnosis Date   Allergic rhinitis    Anemia    Anxiety    Barrett esophagus    CAD (coronary artery disease) 2009   a. Multivessel s/p PCI w/DES 2009 // b. s/p CABG 2011  //  c. LHC 8/15: pLAD 95 ISR, LCx 100, pOM1 40, dRCA 100, S-OM1/OM2 ok, S-D1 ok, S-PDA ok, L-LAD ok, EF 60%   Carotid artery disease ([place])    a. Carotid US 0/26: RICA 3-78%; LICA 58-85% >> FU 1 year  //  b.  Carotid US 9/17: R 1-39%, L 40-59% >> FU 1 year   Chronic diastolic heart failure (HCC)    CKD (chronic kidney disease), stage II    GFR 60-89 ml/min   Depression    Disc disease, degenerative, cervical    Diverticulosis    Gastroparesis    GERD (gastroesophageal reflux disease)    Gout    H/O hiatal hernia    Helicobacter pylori gastritis    History of echocardiogram    a. Echo 11/13: EF 55% to 60%. Grade 2 diastolic dysfunction, MAC, trivial MR, mild LAE, normal RVSF, mild RAE, PASP 39 mmHg  //  b. Echo 4/17: EF 55-60%, normal wall motion, trivial AI, MAC, moderate LAE, mild RVE, PASP 35 mmHg   History of thrombocytopenia    HTN (hypertension)    Hyperlipidemia    Hypothyroidism    LBP (low back pain)    Lumbar disc disease/lumbar spinal stenosis   Morbid obesity (HCC)    Myocardial infarction (HCC)    Osteoarthritis    Osteopenia    PVD (peripheral vascular disease) (HCC)    Sick sinus syndrome ([place])    MDT Dual-chamber PPM implant 02/2012   Type II or unspecified type diabetes mellitus without mention of complication, not stated as uncontrolled     Past Surgical History:  Procedure Laterality Date   ABDOMINAL HYSTERECTOMY  CARDIAC CATHETERIZATION     2011  DR Janat Tabbert (APPT NEXT WEEK)   CHOLECYSTECTOMY     CORONARY ARTERY BYPASS GRAFT  2011   LIMA-LAD, SVG-DIAG, SVG-OM1-OM2, SVG-PDA   CORONARY STENT PLACEMENT     Drug-eluting stent to the left anterior descending, circumflex and right coronary artery in Jan 2009   EYE SURGERY     BIL CATARACT REMOVAL 06/2010   LEFT HEART CATHETERIZATION WITH CORONARY ANGIOGRAM N/A 12/02/2013   Procedure: LEFT HEART CATHETERIZATION WITH CORONARY ANGIOGRAM;  Surgeon: Sinclair Grooms, MD;  Location: [facility] CATH LAB;  Service: Cardiovascular;  Laterality: N/A;   OVARIAN CYST REMOVAL     PACEMAKER INSERTION  03/06/12   MDT Adapta L implanted by Dr Rayann Heman for SSS   PERMANENT PACEMAKER INSERTION N/A 03/06/2012   Procedure: PERMANENT  PACEMAKER INSERTION;  Surgeon: Thompson Grayer, MD;  Location: [facility] CATH LAB;  Service: Cardiovascular;  Laterality: N/A;   SHOULDER ARTHROSCOPY  06/16/2011   Procedure: ARTHROSCOPY SHOULDER;  Surgeon: Sharmon Revere, MD;  Location: [place];  Service: Orthopedics;  Laterality: Left;  LEFT SHOULDER ARTHROSCOPY ACROMIALPLASTY, POSSIBLE MINI OPEN CUFF REPAIR    TUBAL LIGATION      Current Medications: Current Meds  Medication Sig   acetaminophen (TYLENOL) 500 MG tablet Take 500 mg by mouth every 6 (six) hours as needed for headache (pain).   allopurinol (ZYLOPRIM) 100 MG tablet Take 100 mg by mouth at bedtime.    amLODipine (NORVASC) 5 MG tablet Take 5 mg by mouth 2 (two) times daily.   atorvastatin (LIPITOR) 20 MG tablet TAKE 1 TABLET BY MOUTH ONCE DAILY   azelastine (OPTIVAR) 0.05 % ophthalmic solution Place 1 drop into both eyes 2 (two) times daily.   carvedilol (COREG) 12.5 MG tablet Take 1 tablet (12.5 mg total) by mouth 2 (two) times daily.   clopidogrel (PLAVIX) 75 MG tablet TAKE 1 TABLET BY MOUTH  DAILY   ENTRESTO 97-103 MG TAKE 1 TABLET BY MOUTH TWICE  DAILY   furosemide (LASIX) 20 MG tablet Take 1 tablet by mouth daily   insulin NPH Human (HUMULIN N,NOVOLIN N) 100 UNIT/ML injection Inject 5-45 Units into the skin See admin instructions. Inject 45 units subcutaneously every morning and 10 units at night   Multiple Vitamin (MULTIVITAMIN WITH MINERALS) TABS tablet Take 1 tablet by mouth at bedtime.    nitroGLYCERIN (NITROSTAT) 0.4 MG SL tablet DISSOLVE ONE TABLET UNDER THE TONGUE EVERY 5 MINUTES AS NEEDED FOR CHEST PAIN.  DO NOT EXCEED A TOTAL OF 3 DOSES IN 15 MINUTES   omeprazole (PRILOSEC OTC) 20 MG tablet Take 20 mg by mouth daily.   ONE TOUCH ULTRA TEST test strip 1 each by Other route daily as needed (blood sugar).    pantoprazole (PROTONIX) 40 MG tablet Take 40 mg by mouth daily.   potassium chloride (KLOR-CON) 10 MEQ tablet Take 1 tablet (10 mEq total) by mouth daily.   tobramycin  (TOBREX) 0.3 % ophthalmic solution Place 1 drop into the left eye 4 (four) times daily.   [DISCONTINUED] furosemide (LASIX) 40 MG tablet Take 1/2 tablet by mouth on Mon, Wed, & Fridays, may take 1/2 tablet for weight gain of 3 lbs in 1 day   Current Facility-Administered Medications for the 12/29/21 encounter (Office Visit) with Sherren Mocha, MD  Medication   betamethasone acetate-betamethasone sodium phosphate (CELESTONE) injection 3 mg     Allergies:   Ciprofloxacin, Codeine, Hydrocodone, Penicillins, Shellfish allergy, Diltiazem hcl, Sulfonamide derivatives, Morphine and related, Lovastatin,  and Metformin   Social History   Socioeconomic History   Marital status: Widowed    Spouse name: Not on file   Number of children: Not on file   Years of education: Not on file   Highest education level: Not on file  Occupational History   Occupation: RETIRED LPN  Tobacco Use   Smoking status: Former   Smokeless tobacco: Never   Tobacco comments:    quit 30 yrs ago  Vaping Use   Vaping Use: Never used  Substance and Sexual Activity   Alcohol use: No   Drug use: No   Sexual activity: Not on file  Other Topics Concern   Not on file  Social History Narrative   Widowed 2004.., Lives alone..Family history is negative for premature coronary artery disease. Mother died at age 6 with heart disease in her later years, father died at age 32 from a stroke.Marland KitchenShe  has 8 siblings, none of whom have coronary artery disease.         Social Determinants of Health   Financial Resource Strain: Low Risk  (11/01/2018)   Overall Financial Resource Strain (CARDIA)    Difficulty of Paying Living Expenses: Not hard at all  Food Insecurity: No Food Insecurity (08/12/2021)   Hunger Vital Sign    Worried About Running Out of Food in the Last Year: Never true    Ran Out of Food in the Last Year: Never true  Transportation Needs: No Transportation Needs (08/12/2021)   PRAPARE - Radiographer, therapeutic (Medical): No    Lack of Transportation (Non-Medical): No  Physical Activity: Insufficiently Active (02/01/2021)   Exercise Vital Sign    Days of Exercise per Week: 5 days    Minutes of Exercise per Session: 20 min  Stress: No Stress Concern Present (05/04/2021)   [place]    Feeling of Stress : Only a little  Social Connections: Not on file     Family History: The patient's family history includes Coronary artery disease in an other family member; Diabetes in her mother; Heart attack in her mother; Hypertension in her mother; Stroke in her father.  ROS:   Please see the history of present illness.    All other systems reviewed and are negative.  EKGs/Labs/Other Studies Reviewed:    The following studies were reviewed today: Echo 07/13/2015: - Left ventricle: The cavity size was normal. Wall thickness was    normal. Systolic function was normal. The estimated ejection    fraction was in the range of 55% to 60%. Wall motion was normal;    there were no regional wall motion abnormalities.  - Ventricular septum: Septal motion showed paradox. These changes    are consistent with right ventricular pacing.  - Aortic valve: There was trivial regurgitation.  - Mitral valve: Calcified annulus.  - Left atrium: The atrium was moderately dilated.  - Right ventricle: The cavity size was mildly dilated.  - Pulmonary arteries: PA peak pressure: 35 mm Hg (S).   EKG:  EKG is not ordered today.    Recent Labs: 07/07/2021: B Natriuretic Peptide 271.8; Hemoglobin 11.3; Platelets 172 10/15/2021: BUN 35; Creatinine, Ser 1.49; Potassium 4.6; Sodium 140  Recent Lipid Panel    Component Value Date/Time   CHOL 149 05/11/2015 0749   TRIG 66 05/11/2015 0749   TRIG 192 (H) 04/20/2006 1246   HDL 97 05/11/2015 0749   CHOLHDL 1.5 05/11/2015 0749  VLDL 13 05/11/2015 0749   LDLCALC 39 05/11/2015 0749   LDLDIRECT 82.5  09/11/2009 0000     Risk Assessment/Calculations:                Physical Exam:    VS:  BP 130/72   Pulse 61   Ht _0  (1.702 m)   Wt 198 lb 6.4 oz (90 kg)   SpO2 98%   BMI 31.07 kg/m     Wt Readings from Last 3 Encounters:  12/29/21 198 lb 6.4 oz (90 kg)  09/14/21 191 lb 12.8 oz (87 kg)  04/06/21 202 lb 9.6 oz (91.9 kg)     GEN:  Well nourished, well developed elderly woman in no acute distress HEENT: Normal NECK: Moderately increased jugular venous pressure; No carotid bruits LYMPHATICS: No lymphadenopathy CARDIAC: RRR, no murmurs, rubs, gallops RESPIRATORY:  Clear to auscultation without rales, wheezing or rhonchi  ABDOMEN: Soft, non-tender, non-distended MUSCULOSKELETAL: 2+ bilateral pretibial and ankle edema; No deformity  SKIN: Warm and dry NEUROLOGIC:  Alert and oriented x 3 PSYCHIATRIC:  Normal affect   ASSESSMENT:    1. Chronic heart failure with preserved ejection fraction ([place])   2. Essential hypertension   3. Mixed hyperlipidemia   4. Coronary artery disease involving native coronary artery of native heart without angina pectoris   5. Stage 3b chronic kidney disease ([place])   6. Medication management    PLAN:    In order of problems listed above:  The patient has progressive symptoms of congestive heart failure.  I recommended a metabolic panel and 2D echocardiogram today.  Her ejection fraction has been preserved in the past.  She likely has hypertensive heart disease, blood pressure is currently well controlled.  I have asked her to increase furosemide to 40 mg daily for 2 days, then 20 mg daily thereafter.  We will add potassium supplementation at a dose of 10 mill equivalents daily now that she has discontinued spironolactone.  We will bring her back in a few weeks for repeat metabolic panel.  We will arrange APP follow-up in 6 to 8 weeks. Blood pressure well controlled on amlodipine, carvedilol, and Entresto. Treated with atorvastatin 20 mg daily.   Recent LDL cholesterol is 39 mg/dL. No anginal symptoms.  On clopidogrel for antiplatelet therapy.  Risk reduction measures as outlined above. Creatinine approximately 1.5.  Repeat labs today and follow-up in a few weeks after increasing her diuretic therapy.      Medication Adjustments/Labs and Tests Ordered: Current medicines are reviewed at length with the patient today.  Concerns regarding medicines are outlined above.  Orders Placed This Encounter  Procedures   Basic metabolic panel   Basic metabolic panel   ECHOCARDIOGRAM COMPLETE   Meds ordered this encounter  Medications   furosemide (LASIX) 20 MG tablet    Sig: Take 1 tablet by mouth daily    Dispense:  90 tablet    Refill:  3    Dose INCREASE   potassium chloride (KLOR-CON) 10 MEQ tablet    Sig: Take 1 tablet (10 mEq total) by mouth daily.    Dispense:  90 tablet    Refill:  3    Patient Instructions  Medication Instructions:  TAKE Furosemide 48m daily for 2 days, then decrease to 244mdaily thereafter START Potassium Chloride 10 mEq daily STOP Spironolactone *If you need a refill on your cardiac medications before your next appointment, please call your pharmacy*   Lab Work: BMET today, repeat again in  2-3 weeks (same day as ECHO) If you have labs (blood work) drawn today and your tests are completely normal, you will receive your results only by: [place] (if you have MyChart) OR A paper copy in the mail If you have any lab test that is abnormal or we need to change your treatment, we will call you to review the results.   Testing/Procedures: ECHO Your physician has requested that you have an echocardiogram. Echocardiography is a painless test that uses sound waves to create images of your heart. It provides your doctor with information about the size and shape of your heart and how well your heart's chambers and valves are working. This procedure takes approximately one hour. There are no  restrictions for this procedure.  Follow-Up: At [facility], you and your health needs are our priority.  As part of our continuing mission to provide you with exceptional heart care, we have created designated Provider Care Teams.  These Care Teams include your primary Cardiologist (physician) and Advanced Practice Providers (APPs -  Physician Assistants and Nurse Practitioners) who all work together to provide you with the care you need, when you need it.  We recommend signing up for the patient portal called "MyChart".  Sign up information is provided on this After Visit Summary.  MyChart is used to connect with patients for Virtual Visits (Telemedicine).  Patients are able to view lab/test results, encounter notes, upcoming appointments, etc.  Non-urgent messages can be sent to your provider as well.   To learn more about what you can do with MyChart, go to [email or web address].    Your next appointment:   7 week(s)  The format for your next appointment:   In Person  Provider:   Lum Keas, or Kathlen Mody      Important Information About Sugar         Signed, Sherren Mocha, MD  12/29/2021 1:24 PM    [place]

## 2021-12-30 ENCOUNTER — Ambulatory Visit: Payer: Self-pay

## 2021-12-30 DIAGNOSIS — E782 Mixed hyperlipidemia: Secondary | ICD-10-CM | POA: Diagnosis not present

## 2021-12-30 DIAGNOSIS — E119 Type 2 diabetes mellitus without complications: Secondary | ICD-10-CM | POA: Diagnosis not present

## 2021-12-30 DIAGNOSIS — Z79899 Other long term (current) drug therapy: Secondary | ICD-10-CM | POA: Diagnosis not present

## 2021-12-30 DIAGNOSIS — I10 Essential (primary) hypertension: Secondary | ICD-10-CM | POA: Diagnosis not present

## 2021-12-30 DIAGNOSIS — N1832 Chronic kidney disease, stage 3b: Secondary | ICD-10-CM | POA: Diagnosis not present

## 2021-12-30 DIAGNOSIS — I5032 Chronic diastolic (congestive) heart failure: Secondary | ICD-10-CM | POA: Diagnosis not present

## 2021-12-30 NOTE — Patient Outreach (Signed)
  Care Coordination   12/30/2021 Name: [name] MRN: [id number] DOB: 1932/04/28   Care Coordination Outreach Attempts:  An unsuccessful telephone outreach was attempted for a scheduled appointment today.  Follow Up Plan:  Additional outreach attempts will be made to offer the patient care coordination information and services.   Encounter Outcome:  No Answer  Care Coordination Interventions Activated:  No   Care Coordination Interventions:  No, not indicated    Barb Merino, RN, BSN, CCM Care Management Coordinator [place] Management  Direct Phone: [phone]

## 2022-01-04 ENCOUNTER — Ambulatory Visit (INDEPENDENT_AMBULATORY_CARE_PROVIDER_SITE_OTHER): Payer: Medicare Other

## 2022-01-04 DIAGNOSIS — I495 Sick sinus syndrome: Secondary | ICD-10-CM | POA: Diagnosis not present

## 2022-01-05 DIAGNOSIS — H353221 Exudative age-related macular degeneration, left eye, with active choroidal neovascularization: Secondary | ICD-10-CM | POA: Diagnosis not present

## 2022-01-05 DIAGNOSIS — E113293 Type 2 diabetes mellitus with mild nonproliferative diabetic retinopathy without macular edema, bilateral: Secondary | ICD-10-CM | POA: Diagnosis not present

## 2022-01-05 DIAGNOSIS — H353111 Nonexudative age-related macular degeneration, right eye, early dry stage: Secondary | ICD-10-CM | POA: Diagnosis not present

## 2022-01-05 DIAGNOSIS — H35371 Puckering of macula, right eye: Secondary | ICD-10-CM | POA: Diagnosis not present

## 2022-01-05 LAB — CUP PACEART REMOTE DEVICE CHECK
Battery Impedance: 2147 Ohm
Battery Remaining Longevity: 29 mo
Battery Voltage: 2.74 V
Brady Statistic AP VP Percent: 92 %
Brady Statistic AP VS Percent: 0 %
Brady Statistic AS VP Percent: 8 %
Brady Statistic AS VS Percent: 0 %
Date Time Interrogation Session: 20230926124037
Implantable Lead Implant Date: 20131126
Implantable Lead Implant Date: 20131126
Implantable Lead Location: 753859
Implantable Lead Location: 753860
Implantable Lead Model: 5076
Implantable Lead Model: 5092
Implantable Pulse Generator Implant Date: 20131126
Lead Channel Impedance Value: 461 Ohm
Lead Channel Impedance Value: 484 Ohm
Lead Channel Pacing Threshold Amplitude: 0.5 V
Lead Channel Pacing Threshold Amplitude: 1.125 V
Lead Channel Pacing Threshold Pulse Width: 0.4 ms
Lead Channel Pacing Threshold Pulse Width: 0.4 ms
Lead Channel Setting Pacing Amplitude: 2 V
Lead Channel Setting Pacing Amplitude: 2.5 V
Lead Channel Setting Pacing Pulse Width: 0.4 ms
Lead Channel Setting Sensing Sensitivity: 2 mV

## 2022-01-08 DIAGNOSIS — I5032 Chronic diastolic (congestive) heart failure: Secondary | ICD-10-CM | POA: Diagnosis not present

## 2022-01-08 DIAGNOSIS — N1831 Chronic kidney disease, stage 3a: Secondary | ICD-10-CM | POA: Diagnosis not present

## 2022-01-08 DIAGNOSIS — I130 Hypertensive heart and chronic kidney disease with heart failure and stage 1 through stage 4 chronic kidney disease, or unspecified chronic kidney disease: Secondary | ICD-10-CM | POA: Diagnosis not present

## 2022-01-08 DIAGNOSIS — E1122 Type 2 diabetes mellitus with diabetic chronic kidney disease: Secondary | ICD-10-CM | POA: Diagnosis not present

## 2022-01-13 NOTE — Progress Notes (Signed)
Remote pacemaker transmission.   

## 2022-01-18 DIAGNOSIS — E1169 Type 2 diabetes mellitus with other specified complication: Secondary | ICD-10-CM | POA: Diagnosis not present

## 2022-01-18 DIAGNOSIS — K591 Functional diarrhea: Secondary | ICD-10-CM | POA: Diagnosis not present

## 2022-01-18 DIAGNOSIS — I1310 Hypertensive heart and chronic kidney disease without heart failure, with stage 1 through stage 4 chronic kidney disease, or unspecified chronic kidney disease: Secondary | ICD-10-CM | POA: Diagnosis not present

## 2022-01-18 DIAGNOSIS — N1831 Chronic kidney disease, stage 3a: Secondary | ICD-10-CM | POA: Diagnosis not present

## 2022-01-19 ENCOUNTER — Other Ambulatory Visit: Payer: Medicare Other

## 2022-01-19 ENCOUNTER — Other Ambulatory Visit (HOSPITAL_COMMUNITY): Payer: Medicare Other

## 2022-01-27 ENCOUNTER — Other Ambulatory Visit: Payer: Self-pay | Admitting: Cardiovascular Disease

## 2022-01-31 ENCOUNTER — Ambulatory Visit (HOSPITAL_COMMUNITY): Payer: Medicare Other | Attending: Cardiovascular Disease

## 2022-01-31 ENCOUNTER — Ambulatory Visit: Payer: Medicare Other

## 2022-01-31 DIAGNOSIS — E782 Mixed hyperlipidemia: Secondary | ICD-10-CM | POA: Diagnosis not present

## 2022-01-31 DIAGNOSIS — Z79899 Other long term (current) drug therapy: Secondary | ICD-10-CM | POA: Diagnosis not present

## 2022-01-31 DIAGNOSIS — N1832 Chronic kidney disease, stage 3b: Secondary | ICD-10-CM

## 2022-01-31 DIAGNOSIS — I5032 Chronic diastolic (congestive) heart failure: Secondary | ICD-10-CM | POA: Diagnosis not present

## 2022-01-31 DIAGNOSIS — I2510 Atherosclerotic heart disease of native coronary artery without angina pectoris: Secondary | ICD-10-CM | POA: Insufficient documentation

## 2022-01-31 DIAGNOSIS — I10 Essential (primary) hypertension: Secondary | ICD-10-CM

## 2022-01-31 LAB — ECHOCARDIOGRAM COMPLETE
Area-P 1/2: 2.76 cm2
S' Lateral: 2.8 cm

## 2022-02-02 LAB — BASIC METABOLIC PANEL
BUN/Creatinine Ratio: 18 (ref 12–28)
BUN: 29 mg/dL — ABNORMAL HIGH (ref 8–27)
CO2: 20 mmol/L (ref 20–29)
Calcium: 9.2 mg/dL (ref 8.7–10.3)
Chloride: 102 mmol/L (ref 96–106)
Creatinine, Ser: 1.65 mg/dL — ABNORMAL HIGH (ref 0.57–1.00)
Glucose: 174 mg/dL — ABNORMAL HIGH (ref 70–99)
Potassium: 4.6 mmol/L (ref 3.5–5.2)
Sodium: 138 mmol/L (ref 134–144)
eGFR: 30 mL/min/{1.73_m2} — ABNORMAL LOW (ref >59)

## 2022-02-04 ENCOUNTER — Telehealth: Payer: Self-pay

## 2022-02-04 DIAGNOSIS — R7989 Other specified abnormal findings of blood chemistry: Secondary | ICD-10-CM

## 2022-02-04 NOTE — Telephone Encounter (Signed)
-----   Message from Samantha Mocha, MD sent at 02/02/2022  8:21 AM EDT ----- Pt recently seen with symptoms of heart failure. Now taking lasix daily. Creatinine elevated mildly compared to baseline, but would not change medication at this point as she had volume overload at time of office assessment. Follow-up with Richardson Dopp in 2 weeks. Would draw repeat BMET at time of office visit. thx

## 2022-02-04 NOTE — Telephone Encounter (Signed)
Spoke directly with patient who understands that we want to repeat bloodwork at her upcoming 02/16/22 appt to check kidney functioning since we've placed her on diuretic therapy daily. BMET ordered.

## 2022-02-15 ENCOUNTER — Encounter: Payer: Self-pay | Admitting: Podiatry

## 2022-02-15 ENCOUNTER — Ambulatory Visit: Payer: Medicare Other | Admitting: Podiatry

## 2022-02-15 DIAGNOSIS — M79674 Pain in right toe(s): Secondary | ICD-10-CM

## 2022-02-15 DIAGNOSIS — M79675 Pain in left toe(s): Secondary | ICD-10-CM | POA: Diagnosis not present

## 2022-02-15 DIAGNOSIS — B351 Tinea unguium: Secondary | ICD-10-CM

## 2022-02-15 DIAGNOSIS — E1159 Type 2 diabetes mellitus with other circulatory complications: Secondary | ICD-10-CM | POA: Diagnosis not present

## 2022-02-15 NOTE — Progress Notes (Signed)
This patient returns to my office for at risk foot care.  This patient requires this care by a professional since this patient will be at risk due to having CKD and diabetes.  This patient is unable to cut nails herself since the patient cannot reach hiernails.These nails are painful walking and wearing shoes.  This patient presents for at risk foot care today.  General Appearance  Alert, conversant and in no acute stress.  Vascular  Dorsalis pedis and posterior tibial  pulses are  weakly palpable  bilaterally.  Capillary return is within normal limits  bilaterally. Cold feet.    bilaterally. Absent digital hair  B/L.  Neurologic  Senn-Weinstein monofilament wire test within normal limits  bilaterally. Muscle power within normal limits bilaterally.  Nails Thick disfigured discolored nails with subungual debris  from hallux to fifth toes bilaterally. No evidence of bacterial infection or drainage bilaterally.  Orthopedic  No limitations of motion  feet .  No crepitus or effusions noted.  No bony pathology or digital deformities noted. Mild  HAV 1st with hammer toes second right foot.  Skin  normotropic skin with no porokeratosis noted bilaterally.  No signs of infections or ulcers noted.     Onychomycosis  Pain in right toes  Pain in left toes  Consent was obtained for treatment procedures.   Mechanical debridement of nails 1-5  bilaterally performed with a nail nipper.  Filed with dremel without incident.    Return office visit    prn                Told patient to return for periodic foot care and evaluation due to potential at risk complications.   Gardiner Barefoot DPM

## 2022-02-15 NOTE — Progress Notes (Deleted)
Cardiology Office Note:    Date:  02/15/2022   ID:  [name], DOB April 27, 1932, MRN [id number]  PCP:  Lucianne Lei, [place] Providers Cardiologist:  Sherren Mocha, MD Cardiology APP:  Liliane Shi, PA-C  Electrophysiologist:  Thompson Grayer, MD { Click to update primary MD,subspecialty MD or APP then REFRESH:1}  *** Referring MD: Lucianne Lei, MD   Chief Complaint:  No chief complaint on file. {Click here for Visit Info    :1}   Patient Profile: Coronary artery disease  S/p CABG in 2011 Cardiac catheterization 8/15:  S-OM1/OM2, S-D1, S-PDA, L-LAD patent Myoview 11/24/15: no ischemia, no infarct, EF 60; low risk  SSS s/p PPM (HFpEF) heart failure with preserved ejection fraction  Echocardiogram 07/13/15: EF 55-60, no RWMA, trivial AI, MAC, mod LAE, PASP 35 Echo 01/31/2022: EF 55-60, no RWMA, GR 1 DD, mildly reduced RVSF, normal PASP (RVSP 26.2), trivial MR, trivial AI, AV sclerosis w/o AS, RAP 3 Intol of SGLT2i (candidiasis); Arlyce Harman (diarrhea) Hypertension Hyperlipidemia  Diabetes mellitus  Chronic kidney disease  Peripheral arterial disease  GERD/Barrett's esophagus Carotid artery disease [place] 10/19: Bilateral ICA 1-39 Hypothyroidism  Cardiac Studies & Procedures     STRESS TESTS  NM MYOCAR MULTI W/SPECT W 11/24/2015  Narrative  The patient has V pacing with corresponding ECG changes.  The study is normal. No ischemia. No evidence of MI  This is a low risk study.  The left ventricular ejection fraction is normal (55-65%).  Nuclear stress EF: 60%.   ECHOCARDIOGRAM  ECHOCARDIOGRAM COMPLETE 01/31/2022  Narrative ECHOCARDIOGRAM REPORT    Patient Name:   [name] Date of Exam: 01/31/2022 Medical Rec #:  [id number]       Height:       67.0 in Accession #:    [id number]      Weight:       198.4 lb Date of Birth:  [birth year]       BSA:          2.015 m Patient Age:    88 years        BP:           130/72 mmHg Patient Gender: F                HR:           60 bpm. Exam Location:  [address]  Procedure: 2D Echo, Cardiac Doppler and Color Doppler  Indications:    I50.32 Chronic heart failure with preserved ejection fraction  History:        Patient has prior history of Echocardiogram examinations, most recent 07/13/2015. Previous Myocardial Infarction and CAD, Pacemaker; Risk Factors:Hypertension, Dyslipidemia and Diabetes.  Sonographer:    Wilford Sports Rodgers-Jones RDCS Referring Phys: [place]   1. Left ventricular ejection fraction, by estimation, is 55 to 60%. The left ventricle has normal function. The left ventricle has no regional wall motion abnormalities. Left ventricular diastolic parameters are consistent with Grade I diastolic dysfunction (impaired relaxation). 2. Right ventricular systolic function is mildly reduced. The right ventricular size is mildly enlarged. There is normal pulmonary artery systolic pressure. The estimated right ventricular systolic pressure is 03.0 mmHg. 3. The mitral valve is normal in structure. Trivial mitral valve regurgitation. 4. The aortic valve is tricuspid. Aortic valve regurgitation is trivial. Aortic valve sclerosis/calcification is present, without any evidence of aortic stenosis. 5. The inferior vena cava is normal in size with greater than 50% respiratory variability,  suggesting right atrial pressure of 3 mmHg.  FINDINGS Left Ventricle: Left ventricular ejection fraction, by estimation, is 55 to 60%. The left ventricle has normal function. The left ventricle has no regional wall motion abnormalities. The left ventricular internal cavity size was normal in size. There is no left ventricular hypertrophy. Left ventricular diastolic parameters are consistent with Grade I diastolic dysfunction (impaired relaxation).  Right Ventricle: The right ventricular size is mildly enlarged. No increase in right ventricular wall thickness. Right ventricular systolic  function is mildly reduced. There is normal pulmonary artery systolic pressure. The tricuspid regurgitant velocity is 2.41 m/s, and with an assumed right atrial pressure of 3 mmHg, the estimated right ventricular systolic pressure is 59.5 mmHg.  Left Atrium: Left atrial size was normal in size.  Right Atrium: Right atrial size was normal in size.  Pericardium: There is no evidence of pericardial effusion.  Mitral Valve: The mitral valve is normal in structure. Trivial mitral valve regurgitation.  Tricuspid Valve: The tricuspid valve is normal in structure. Tricuspid valve regurgitation is trivial.  Aortic Valve: The aortic valve is tricuspid. Aortic valve regurgitation is trivial. Aortic valve sclerosis/calcification is present, without any evidence of aortic stenosis.  Pulmonic Valve: The pulmonic valve was not well visualized. Pulmonic valve regurgitation is trivial.  Aorta: The aortic root and ascending aorta are structurally normal, with no evidence of dilitation.  Venous: The inferior vena cava is normal in size with greater than 50% respiratory variability, suggesting right atrial pressure of 3 mmHg.  IAS/Shunts: The interatrial septum was not well visualized.   LEFT VENTRICLE PLAX 2D LVIDd:         4.00 cm   Diastology LVIDs:         2.80 cm   LV e' medial:    6.31 cm/s LV PW:         0.90 cm   LV E/e' medial:  13.1 LV IVS:        0.90 cm   LV e' lateral:   7.29 cm/s LVOT diam:     1.60 cm   LV E/e' lateral: 11.3 LV SV:         53 LV SV Index:   26 LVOT Area:     2.01 cm   RIGHT VENTRICLE RV Basal diam:  4.10 cm RV S prime:     7.78 cm/s TAPSE (M-mode): 1.4 cm  LEFT ATRIUM             Index        RIGHT ATRIUM           Index LA diam:        4.30 cm 2.13 cm/m   RA Area:     12.20 cm LA Vol (A2C):   66.3 ml 32.90 ml/m  RA Volume:   29.70 ml  14.74 ml/m LA Vol (A4C):   64.1 ml 31.81 ml/m LA Biplane Vol: 66.8 ml 33.15 ml/m AORTIC VALVE LVOT Vmax:   104.50  cm/s LVOT Vmean:  72.250 cm/s LVOT VTI:    0.262 m  AORTA Ao Root diam: 3.20 cm Ao Asc diam:  3.40 cm  MITRAL VALVE                TRICUSPID VALVE MV Area (PHT): 2.76 cm     TR Peak grad:   23.2 mmHg MV Decel Time: 275 msec     TR Vmax:        241.00 cm/s MV E velocity: 82.55 cm/s MV  A velocity: 103.50 cm/s  SHUNTS MV E/A ratio:  0.80         Systemic VTI:  0.26 m Systemic Diam: 1.60 cm  Oswaldo Milian MD Electronically signed by Oswaldo Milian MD Signature Date/Time: 01/31/2022/11:30:58 PM    Final              History of Present Illness:   [name] is a 88 y.o. female with the above problem list.  She was last seen by Dr. Burt Knack in September 2023.  The patient had stopped spironolactone secondary to diarrhea.  She was volume overloaded at that visit and her furosemide was increased to 40 mg daily x2 days, then 20 mg daily.  Recent follow-up labs on 02/02/2022 demonstrated creatinine increased to 1.65.  Dr. Burt Knack recommended continued medical therapy given recent episode of volume excess.  She returns for follow-up. ***     Past Medical History:  Diagnosis Date   Allergic rhinitis    Anemia    Anxiety    Barrett esophagus    CAD (coronary artery disease) 2009   a. Multivessel s/p PCI w/DES 2009 // b. s/p CABG 2011  //  c. LHC 8/15: pLAD 95 ISR, LCx 100, pOM1 40, dRCA 100, S-OM1/OM2 ok, S-D1 ok, S-PDA ok, L-LAD ok, EF 60%   Carotid artery disease ([place])    a. Carotid US 6/81: RICA 2-75%; LICA 17-00% >> FU 1 year  //  b. Carotid US 9/17: R 1-39%, L 40-59% >> FU 1 year   Chronic diastolic heart failure (HCC)    CKD (chronic kidney disease), stage II    GFR 60-89 ml/min   Depression    Disc disease, degenerative, cervical    Diverticulosis    Gastroparesis    GERD (gastroesophageal reflux disease)    Gout    H/O hiatal hernia    Helicobacter pylori gastritis    History of echocardiogram    a. Echo 11/13: EF 55% to 60%. Grade 2 diastolic  dysfunction, MAC, trivial MR, mild LAE, normal RVSF, mild RAE, PASP 39 mmHg  //  b. Echo 4/17: EF 55-60%, normal wall motion, trivial AI, MAC, moderate LAE, mild RVE, PASP 35 mmHg   History of thrombocytopenia    HTN (hypertension)    Hyperlipidemia    Hypothyroidism    LBP (low back pain)    Lumbar disc disease/lumbar spinal stenosis   Morbid obesity (HCC)    Myocardial infarction ([place])    Osteoarthritis    Osteopenia    PVD (peripheral vascular disease) ([place])    Sick sinus syndrome ([place])    MDT Dual-chamber PPM implant 02/2012   Type II or unspecified type diabetes mellitus without mention of complication, not stated as uncontrolled    Current Medications: No outpatient medications have been marked as taking for the 02/16/22 encounter (Appointment) with Richardson Dopp T, PA-C.   Current Facility-Administered Medications for the 02/16/22 encounter (Appointment) with Richardson Dopp T, PA-C  Medication   betamethasone acetate-betamethasone sodium phosphate (CELESTONE) injection 3 mg    Allergies:   Ciprofloxacin, Codeine, Hydrocodone, Penicillins, Shellfish allergy, Diltiazem hcl, Sulfonamide derivatives, Morphine and related, Lovastatin, and Metformin   Social History   Tobacco Use   Smoking status: Former   Smokeless tobacco: Never   Tobacco comments:    quit 30 yrs ago  Vaping Use   Vaping Use: Never used  Substance Use Topics   Alcohol use: No   Drug use: No    Family Hx: The patient's  family history includes Coronary artery disease in an other family member; Diabetes in her mother; Heart attack in her mother; Hypertension in her mother; Stroke in her father.  ROS   EKGs/Labs/Other Test Reviewed:    EKG:  EKG is *** ordered today.  The ekg ordered today demonstrates ***   Recent Labs: 07/07/2021: B Natriuretic Peptide 271.8; Hemoglobin 11.3; Platelets 172 01/31/2022: BUN 29; Creatinine, Ser 1.65; Potassium 4.6; Sodium 138   Recent Lipid Panel No results for  input(s): "CHOL", "TRIG", "HDL", "VLDL", "LDLCALC", "LDLDIRECT" in the last 8760 hours.    Risk Assessment/Calculations/Metrics:   {Does this patient have ATRIAL FIBRILLATION?[phone]}     No BP recorded.  {Refresh Note OR Click here to enter BP  :1}***    Physical Exam:    VS:  There were no vitals taken for this visit.    Wt Readings from Last 3 Encounters:  12/29/21 198 lb 6.4 oz (90 kg)  09/14/21 191 lb 12.8 oz (87 kg)  04/06/21 202 lb 9.6 oz (91.9 kg)    Physical Exam ***     ASSESSMENT & PLAN:   No problem-specific Assessment & Plan notes found for this encounter.   ***(HFpEF) heart failure with preserved ejection fraction ([place]) She continues to have some volume excess on exam.  She is NYHA III.  She could not tolerate Farxiga secondary to yeast infection.  She is on max dose Entresto 97/103 mg twice daily.  Continue current dose of furosemide 40 mg daily.  Discontinue potassium.  Start spironolactone 25 mg daily.  Obtain BMET next week and 1 week after.  Follow-up in 3 months.   Essential hypertension Trolled.  Continue amlodipine 5 mg twice daily, carvedilol 12.5 mg twice daily, Entresto 97/103 mg twice daily.  Start spironolactone 25 mg daily as noted.  Follow-up in 3 months.   Hyperlipidemia LDL goal <70 Lipids optimal.  Continue atorvastatin 20 mg daily.   CAD (coronary artery disease) History of CABG in 2011.  Cardiac catheterization in 2015 demonstrated patent grafts.  Myoview in 2017 was low risk.  She is not having any exertional angina.  Continue clopidogrel 75 mg daily, atorvastatin 20 mg daily.   Type 2 diabetes mellitus with vascular disease ([place]) Managed by primary care.   CKD (chronic kidney disease), stage III (HCC) Recent creatinine has been stable.  Spironolactone will be started to help better manage her blood pressure and heart failure.  She should be able to tolerate this.  However, if her creatinine starts to increase, we will need to stop  it.***       {Are you ordering a CV Procedure (e.g. stress test, cath, DCCV, TEE, etc)?   Press F2        :[id number]}   Dispo:  No follow-ups on file.   Medication Adjustments/Labs and Tests Ordered: Current medicines are reviewed at length with the patient today.  Concerns regarding medicines are outlined above.  Tests Ordered: No orders of the defined types were placed in this encounter.  Medication Changes: No orders of the defined types were placed in this encounter.  Signed, Richardson Dopp, PA-C  02/15/2022 5:54 PM    [address], [place], [place]  [address] Phone: [phone]; Fax: [phone]

## 2022-02-16 ENCOUNTER — Other Ambulatory Visit: Payer: Medicare Other

## 2022-02-16 ENCOUNTER — Ambulatory Visit: Payer: Medicare Other | Attending: Physician Assistant | Admitting: Physician Assistant

## 2022-02-16 DIAGNOSIS — I2510 Atherosclerotic heart disease of native coronary artery without angina pectoris: Secondary | ICD-10-CM

## 2022-02-16 DIAGNOSIS — I5032 Chronic diastolic (congestive) heart failure: Secondary | ICD-10-CM

## 2022-02-18 ENCOUNTER — Ambulatory Visit: Payer: Self-pay

## 2022-02-18 NOTE — Patient Outreach (Signed)
  Care Coordination   02/18/2022 Name: [name] MRN: [id number] DOB: 01-09-1933   Care Coordination Outreach Attempts:  An unsuccessful telephone outreach was attempted for a scheduled appointment today.  Follow Up Plan:  No further outreach attempts will be made at this time. We have been unable to contact the patient to offer or enroll patient in care coordination services  Encounter Outcome:  No Answer  Care Coordination Interventions Activated:  No   Care Coordination Interventions:  No, not indicated    Delsa Sale, RN, BSN, CCM Care Management Coordinator [facility] Care Management Direct Phone: [phone]

## 2022-02-22 ENCOUNTER — Other Ambulatory Visit (HOSPITAL_BASED_OUTPATIENT_CLINIC_OR_DEPARTMENT_OTHER): Payer: Self-pay

## 2022-02-22 ENCOUNTER — Telehealth: Payer: Self-pay | Admitting: Family Medicine

## 2022-02-22 MED ORDER — COMIRNATY 30 MCG/0.3ML IM SUSY
PREFILLED_SYRINGE | INTRAMUSCULAR | 0 refills | Status: DC
  Filled 2022-02-22: qty 0.3, 1d supply, fill #0

## 2022-02-22 MED ORDER — FLUAD QUADRIVALENT 0.5 ML IM PRSY
PREFILLED_SYRINGE | INTRAMUSCULAR | 0 refills | Status: DC
  Filled 2022-02-22: qty 0.5, 1d supply, fill #0

## 2022-02-22 NOTE — Telephone Encounter (Signed)
Patient is [name] mother.  She would like to become your patient   please advise

## 2022-03-06 ENCOUNTER — Other Ambulatory Visit: Payer: Self-pay | Admitting: Internal Medicine

## 2022-03-07 NOTE — Progress Notes (Deleted)
Cardiology Office Note:    Date:  03/07/2022   ID:  [name], DOB [birth year], MRN [id number]  PCP:  Lucianne Lei, MD  [place] [facility] Cardiologist:  Sherren Mocha, MD Cardiology APP:  Liliane Shi, PA-C  Electrophysiologist:  Thompson Grayer, MD  { Click to update primary MD,subspecialty MD or APP then REFRESH:1}  *** Referring MD: Lucianne Lei, MD   Chief Complaint: *** No chief complaint on file. {Click here for Visit Info    :1}   Patient Profile: Coronary artery disease  S/p CABG in 2011 Cardiac catheterization 8/15:  S-OM1/OM2, S-D1, S-PDA, L-LAD patent Myoview 11/24/15: no ischemia, no infarct, EF 60; low risk  SSS s/p PPM (HFpEF) heart failure with preserved ejection fraction  Echocardiogram 07/13/15: EF 55-60, no RWMA, trivial AI, MAC, mod LAE, PASP 35 Echo 01/31/2022: EF 55-60, no RWMA, GR 1 DD, mildly reduced RVSF, normal PASP (RVSP 26.2), trivial MR, trivial AI, AV sclerosis w/o AS, RAP 3 Intol of SGLT2i (candidiasis); Arlyce Harman (diarrhea) Hypertension Hyperlipidemia  Diabetes mellitus  Chronic kidney disease  Peripheral arterial disease  GERD/Barrett's esophagus Carotid artery disease [place] 10/19: Bilateral ICA 1-39 Hypothyroidism  Cardiac Studies & Procedures     STRESS TESTS  NM MYOCAR MULTI W/SPECT W 11/24/2015  Narrative  The patient has V pacing with corresponding ECG changes.  The study is normal. No ischemia. No evidence of MI  This is a low risk study.  The left ventricular ejection fraction is normal (55-65%).  Nuclear stress EF: 60%.  ECHOCARDIOGRAM  ECHOCARDIOGRAM COMPLETE 01/31/2022  Narrative ECHOCARDIOGRAM REPORT    Patient Name:   [name] Date of Exam: 01/31/2022 Medical Rec #:  [id number]       Height:       67.0 in Accession #:    [id number]      Weight:       198.4 lb Date of Birth:  12-25-1932       BSA:          2.015 m Patient Age:    88 years        BP:           130/72 mmHg Patient Gender: F                HR:           60 bpm. Exam Location:  [address]  Procedure: 2D Echo, Cardiac Doppler and Color Doppler  Indications:    I50.32 Chronic heart failure with preserved ejection fraction  History:        Patient has prior history of Echocardiogram examinations, most recent 07/13/2015. Previous Myocardial Infarction and CAD, Pacemaker; Risk Factors:Hypertension, Dyslipidemia and Diabetes.  Sonographer:    Wilford Sports Rodgers-Jones RDCS Referring Phys: [place]   1. Left ventricular ejection fraction, by estimation, is 55 to 60%. The left ventricle has normal function. The left ventricle has no regional wall motion abnormalities. Left ventricular diastolic parameters are consistent with Grade I diastolic dysfunction (impaired relaxation). 2. Right ventricular systolic function is mildly reduced. The right ventricular size is mildly enlarged. There is normal pulmonary artery systolic pressure. The estimated right ventricular systolic pressure is 57.3 mmHg. 3. The mitral valve is normal in structure. Trivial mitral valve regurgitation. 4. The aortic valve is tricuspid. Aortic valve regurgitation is trivial. Aortic valve sclerosis/calcification is present, without any evidence of aortic stenosis. 5. The inferior vena cava is normal in size with greater than 50% respiratory variability,  suggesting right atrial pressure of 3 mmHg.  FINDINGS Left Ventricle: Left ventricular ejection fraction, by estimation, is 55 to 60%. The left ventricle has normal function. The left ventricle has no regional wall motion abnormalities. The left ventricular internal cavity size was normal in size. There is no left ventricular hypertrophy. Left ventricular diastolic parameters are consistent with Grade I diastolic dysfunction (impaired relaxation).  Right Ventricle: The right ventricular size is mildly enlarged. No increase in right ventricular wall thickness. Right ventricular  systolic function is mildly reduced. There is normal pulmonary artery systolic pressure. The tricuspid regurgitant velocity is 2.41 m/s, and with an assumed right atrial pressure of 3 mmHg, the estimated right ventricular systolic pressure is 42.8 mmHg.  Left Atrium: Left atrial size was normal in size.  Right Atrium: Right atrial size was normal in size.  Pericardium: There is no evidence of pericardial effusion.  Mitral Valve: The mitral valve is normal in structure. Trivial mitral valve regurgitation.  Tricuspid Valve: The tricuspid valve is normal in structure. Tricuspid valve regurgitation is trivial.  Aortic Valve: The aortic valve is tricuspid. Aortic valve regurgitation is trivial. Aortic valve sclerosis/calcification is present, without any evidence of aortic stenosis.  Pulmonic Valve: The pulmonic valve was not well visualized. Pulmonic valve regurgitation is trivial.  Aorta: The aortic root and ascending aorta are structurally normal, with no evidence of dilitation.  Venous: The inferior vena cava is normal in size with greater than 50% respiratory variability, suggesting right atrial pressure of 3 mmHg.  IAS/Shunts: The interatrial septum was not well visualized.   LEFT VENTRICLE PLAX 2D LVIDd:         4.00 cm   Diastology LVIDs:         2.80 cm   LV e' medial:    6.31 cm/s LV PW:         0.90 cm   LV E/e' medial:  13.1 LV IVS:        0.90 cm   LV e' lateral:   7.29 cm/s LVOT diam:     1.60 cm   LV E/e' lateral: 11.3 LV SV:         53 LV SV Index:   26 LVOT Area:     2.01 cm   RIGHT VENTRICLE RV Basal diam:  4.10 cm RV S prime:     7.78 cm/s TAPSE (M-mode): 1.4 cm  LEFT ATRIUM             Index        RIGHT ATRIUM           Index LA diam:        4.30 cm 2.13 cm/m   RA Area:     12.20 cm LA Vol (A2C):   66.3 ml 32.90 ml/m  RA Volume:   29.70 ml  14.74 ml/m LA Vol (A4C):   64.1 ml 31.81 ml/m LA Biplane Vol: 66.8 ml 33.15 ml/m AORTIC VALVE LVOT Vmax:    104.50 cm/s LVOT Vmean:  72.250 cm/s LVOT VTI:    0.262 m  AORTA Ao Root diam: 3.20 cm Ao Asc diam:  3.40 cm  MITRAL VALVE                TRICUSPID VALVE MV Area (PHT): 2.76 cm     TR Peak grad:   23.2 mmHg MV Decel Time: 275 msec     TR Vmax:        241.00 cm/s MV E velocity: 82.55 cm/s MV  A velocity: 103.50 cm/s  SHUNTS MV E/A ratio:  0.80         Systemic VTI:  0.26 m Systemic Diam: 1.60 cm  Oswaldo Milian MD Electronically signed by Oswaldo Milian MD Signature Date/Time: 01/31/2022/11:30:58 PM    Final              History of Present Illness:   ZHURI KRASS is a 88 y.o. female with the above problem list.  She was last seen by Dr. Burt Knack in September 2023.  The patient had stopped spironolactone secondary to diarrhea.  She was volume overloaded at that visit and her furosemide was increased to 40 mg daily x2 days, then 20 mg daily.  Recent follow-up labs on 02/02/2022 demonstrated creatinine increased to 1.65.  Dr. Burt Knack recommended continued medical therapy given recent episode of volume excess.    She returns for follow-up. ***     Past Medical History:  Diagnosis Date   Allergic rhinitis    Anemia    Anxiety    Barrett esophagus    CAD (coronary artery disease) 2009   a. Multivessel s/p PCI w/DES 2009 // b. s/p CABG 2011  //  c. LHC 8/15: pLAD 95 ISR, LCx 100, pOM1 40, dRCA 100, S-OM1/OM2 ok, S-D1 ok, S-PDA ok, L-LAD ok, EF 60%   Carotid artery disease ([place])    a. Carotid US 1/61: RICA 0-96%; LICA 04-54% >> FU 1 year  //  b. Carotid US 9/17: R 1-39%, L 40-59% >> FU 1 year   Chronic diastolic heart failure (HCC)    CKD (chronic kidney disease), stage II    GFR 60-89 ml/min   Depression    Disc disease, degenerative, cervical    Diverticulosis    Gastroparesis    GERD (gastroesophageal reflux disease)    Gout    H/O hiatal hernia    Helicobacter pylori gastritis    History of echocardiogram    a. Echo 11/13: EF 55% to 60%. Grade 2 diastolic  dysfunction, MAC, trivial MR, mild LAE, normal RVSF, mild RAE, PASP 39 mmHg  //  b. Echo 4/17: EF 55-60%, normal wall motion, trivial AI, MAC, moderate LAE, mild RVE, PASP 35 mmHg   History of thrombocytopenia    HTN (hypertension)    Hyperlipidemia    Hypothyroidism    LBP (low back pain)    Lumbar disc disease/lumbar spinal stenosis   Morbid obesity (HCC)    Myocardial infarction ([place])    Osteoarthritis    Osteopenia    PVD (peripheral vascular disease) ([place])    Sick sinus syndrome ([place])    MDT Dual-chamber PPM implant 02/2012   Type II or unspecified type diabetes mellitus without mention of complication, not stated as uncontrolled    Current Medications: No outpatient medications have been marked as taking for the 03/08/22 encounter (Appointment) with Richardson Dopp T, PA-C.   Current Facility-Administered Medications for the 03/08/22 encounter (Appointment) with Richardson Dopp T, PA-C  Medication   betamethasone acetate-betamethasone sodium phosphate (CELESTONE) injection 3 mg    Allergies:   Ciprofloxacin, Codeine, Hydrocodone, Penicillins, Shellfish allergy, Diltiazem hcl, Sulfonamide derivatives, Morphine and related, Lovastatin, and Metformin   Social History   Tobacco Use   Smoking status: Former   Smokeless tobacco: Never   Tobacco comments:    quit 30 yrs ago  Vaping Use   Vaping Use: Never used  Substance Use Topics   Alcohol use: No   Drug use: No    Family Hx:  The patient's family history includes Coronary artery disease in an other family member; Diabetes in her mother; Heart attack in her mother; Hypertension in her mother; Stroke in her father.  ROS ***  EKGs/Labs/Other Test Reviewed:    EKG:  EKG is *** ordered today.  The ekg ordered today demonstrates ***   Recent Labs: 07/07/2021: B Natriuretic Peptide 271.8; Hemoglobin 11.3; Platelets 172 01/31/2022: BUN 29; Creatinine, Ser 1.65; Potassium 4.6; Sodium 138   Recent Lipid Panel No results for  input(s): "CHOL", "TRIG", "HDL", "VLDL", "LDLCALC", "LDLDIRECT" in the last 8760 hours.    Risk Assessment/Calculations/Metrics:   {Does this patient have ATRIAL FIBRILLATION?:[phone]}     No BP recorded.  {Refresh Note OR Click here to enter BP  :1}***    Physical Exam:    VS:  There were no vitals taken for this visit.    Wt Readings from Last 3 Encounters:  12/29/21 198 lb 6.4 oz (90 kg)  09/14/21 191 lb 12.8 oz (87 kg)  04/06/21 202 lb 9.6 oz (91.9 kg)    Physical Exam ***     ASSESSMENT & PLAN:   No problem-specific Assessment & Plan notes found for this encounter.   ***(HFpEF) heart failure with preserved ejection fraction ([employer]) She continues to have some volume excess on exam.  She is NYHA III.  She could not tolerate Farxiga secondary to yeast infection.  She is on max dose Entresto 97/103 mg twice daily.  Continue current dose of furosemide 40 mg daily.  Discontinue potassium.  Start spironolactone 25 mg daily.  Obtain BMET next week and 1 week after.  Follow-up in 3 months.   Essential hypertension Trolled.  Continue amlodipine 5 mg twice daily, carvedilol 12.5 mg twice daily, Entresto 97/103 mg twice daily.  Start spironolactone 25 mg daily as noted.  Follow-up in 3 months.   Hyperlipidemia LDL goal <70 Lipids optimal.  Continue atorvastatin 20 mg daily.   CAD (coronary artery disease) History of CABG in 2011.  Cardiac catheterization in 2015 demonstrated patent grafts.  Myoview in 2017 was low risk.  She is not having any exertional angina.  Continue clopidogrel 75 mg daily, atorvastatin 20 mg daily.   Type 2 diabetes mellitus with vascular disease ([place]) Managed by primary care.   CKD (chronic kidney disease), stage III (HCC) Recent creatinine has been stable.  Spironolactone will be started to help better manage her blood pressure and heart failure.  She should be able to tolerate this.  However, if her creatinine starts to increase, we will need to stop  it.***       {Are you ordering a CV Procedure (e.g. stress test, cath, DCCV, TEE, etc)?   Press F2        :[id number]}   Dispo:  No follow-ups on file.   Medication Adjustments/Labs and Tests Ordered: Current medicines are reviewed at length with the patient today.  Concerns regarding medicines are outlined above.  Tests Ordered: No orders of the defined types were placed in this encounter.  Medication Changes: No orders of the defined types were placed in this encounter.  Signed, Richardson Dopp, PA-C  03/07/2022 1:52 PM    [address], [place], [place]  [address] Phone: [phone]; Fax: [phone]

## 2022-03-08 ENCOUNTER — Ambulatory Visit: Payer: Medicare Other | Admitting: Physician Assistant

## 2022-03-08 DIAGNOSIS — I5032 Chronic diastolic (congestive) heart failure: Secondary | ICD-10-CM

## 2022-03-10 NOTE — Progress Notes (Deleted)
Cardiology Office Note Date:  03/10/2022  Patient ID:  [name], DOB 1932-10-05, MRN [id number] PCP:  Lucianne Lei, MD  Cardiologist:  Dr. Burt Knack Electrophysiologist: Dr. Rayann Heman   ***refresh   Chief Complaint: *** annual visit  History of Present Illness: [name] is a 88 y.o. female with history of CAD (PCI 2009, CABG 2011), CKD (***), HTN, HLD, DM, symptomatic bradycardia/heart block w/PPM, hypothyroidism, HFpEF  She saw Dr. Rayann Heman 04/06/21, doing well, AV pacing >80% and MVP turned off, no changes otherwise.  Encouraged healthy lifestyle and weight loss.  Most recently saw Dr. Burt Knack 12/29/21, at the visit prior to this found volume OL and started on spironolactone, but unfortunately unable to tolerate 2/2 diarrhea.  Planned to update her echo, increased her lasix for 2 days and get labs.  *** symptoms *** volume *** meds, CAD   Device information MDT dual chamber PPM implanted 03/06/12   Past Medical History:  Diagnosis Date   Allergic rhinitis    Anemia    Anxiety    Barrett esophagus    CAD (coronary artery disease) 2009   a. Multivessel s/p PCI w/DES 2009 // b. s/p CABG 2011  //  c. LHC 8/15: pLAD 95 ISR, LCx 100, pOM1 40, dRCA 100, S-OM1/OM2 ok, S-D1 ok, S-PDA ok, L-LAD ok, EF 60%   Carotid artery disease ([place])    a. Carotid US 7/11: RICA 6-57%; LICA 90-38% >> FU 1 year  //  b. Carotid US 9/17: R 1-39%, L 40-59% >> FU 1 year   Chronic diastolic heart failure (HCC)    CKD (chronic kidney disease), stage II    GFR 60-89 ml/min   Depression    Disc disease, degenerative, cervical    Diverticulosis    Gastroparesis    GERD (gastroesophageal reflux disease)    Gout    H/O hiatal hernia    Helicobacter pylori gastritis    History of echocardiogram    a. Echo 11/13: EF 55% to 60%. Grade 2 diastolic dysfunction, MAC, trivial MR, mild LAE, normal RVSF, mild RAE, PASP 39 mmHg  //  b. Echo 4/17: EF 55-60%, normal wall motion, trivial AI, MAC, moderate  LAE, mild RVE, PASP 35 mmHg   History of thrombocytopenia    HTN (hypertension)    Hyperlipidemia    Hypothyroidism    LBP (low back pain)    Lumbar disc disease/lumbar spinal stenosis   Morbid obesity (HCC)    Myocardial infarction ([place])    Osteoarthritis    Osteopenia    PVD (peripheral vascular disease) ([place])    Sick sinus syndrome ([place])    MDT Dual-chamber PPM implant 02/2012   Type II or unspecified type diabetes mellitus without mention of complication, not stated as uncontrolled     Past Surgical History:  Procedure Laterality Date   ABDOMINAL HYSTERECTOMY     CARDIAC CATHETERIZATION     2011  DR COOPER (APPT NEXT WEEK)   CHOLECYSTECTOMY     CORONARY ARTERY BYPASS GRAFT  2011   LIMA-LAD, SVG-DIAG, SVG-OM1-OM2, SVG-PDA   CORONARY STENT PLACEMENT     Drug-eluting stent to the left anterior descending, circumflex and right coronary artery in Jan 2009   EYE SURGERY     BIL CATARACT REMOVAL 06/2010   LEFT HEART CATHETERIZATION WITH CORONARY ANGIOGRAM N/A 12/02/2013   Procedure: LEFT HEART CATHETERIZATION WITH CORONARY ANGIOGRAM;  Surgeon: Sinclair Grooms, MD;  Location[facility] C[facility] LAB;  Service: Cardiovascular;  Laterality: N/A;  OVARIAN CYST REMOVAL     PACEMAKER INSERTION  03/06/12   MDT Adapta L implanted by Dr Rayann Heman for SSS   PERMANENT PACEMAKER INSERTION N/A 03/06/2012   Procedure: PERMANENT PACEMAKER INSERTION;  Surgeon: Thompson Grayer, MD;  Location: [facility] CATH LAB;  Service: Cardiovascular;  Laterality: N/A;   SHOULDER ARTHROSCOPY  06/16/2011   Procedure: ARTHROSCOPY SHOULDER;  Surgeon: Sharmon Revere, MD;  Location: [facility];  Service: Orthopedics;  Laterality: Left;  LEFT SHOULDER ARTHROSCOPY ACROMIALPLASTY, POSSIBLE MINI OPEN CUFF REPAIR    TUBAL LIGATION      Current Outpatient Medications  Medication Sig Dispense Refill   acetaminophen (TYLENOL) 500 MG tablet Take 500 mg by mouth every 6 (six) hours as needed for headache (pain).     allopurinol (ZYLOPRIM) 100 MG  tablet Take 100 mg by mouth at bedtime.      amLODipine (NORVASC) 5 MG tablet Take 5 mg by mouth 2 (two) times daily.     atorvastatin (LIPITOR) 20 MG tablet TAKE 1 TABLET BY MOUTH ONCE DAILY 90 tablet 2   azelastine (OPTIVAR) 0.05 % ophthalmic solution Place 1 drop into both eyes 2 (two) times daily.     carvedilol (COREG) 12.5 MG tablet TAKE 1 TABLET BY MOUTH TWICE  DAILY 200 tablet 2   clopidogrel (PLAVIX) 75 MG tablet TAKE 1 TABLET BY MOUTH  DAILY 90 tablet 3   COVID-19 mRNA vaccine 2023-2024 (COMIRNATY) syringe Inject into the muscle. 0.3 mL 0   ENTRESTO 97-103 MG TAKE 1 TABLET BY MOUTH TWICE  DAILY 180 tablet 3   furosemide (LASIX) 20 MG tablet Take 1 tablet by mouth daily 90 tablet 3   influenza vaccine adjuvanted (FLUAD QUADRIVALENT) 0.5 ML injection Inject into the muscle. 0.5 mL 0   insulin NPH Human (HUMULIN N,NOVOLIN N) 100 UNIT/ML injection Inject 5-45 Units into the skin See admin instructions. Inject 45 units subcutaneously every morning and 10 units at night     Multiple Vitamin (MULTIVITAMIN WITH MINERALS) TABS tablet Take 1 tablet by mouth at bedtime.      nitroGLYCERIN (NITROSTAT) 0.4 MG SL tablet DISSOLVE ONE TABLET UNDER THE TONGUE EVERY 5 MINUTES AS NEEDED FOR CHEST PAIN.  DO NOT EXCEED A TOTAL OF 3 DOSES IN 15 MINUTES 25 tablet 3   omeprazole (PRILOSEC OTC) 20 MG tablet Take 20 mg by mouth daily.     ONE TOUCH ULTRA TEST test strip 1 each by Other route daily as needed (blood sugar).      pantoprazole (PROTONIX) 40 MG tablet Take 40 mg by mouth daily.     potassium chloride (KLOR-CON) 10 MEQ tablet Take 1 tablet (10 mEq total) by mouth daily. 90 tablet 3   tobramycin (TOBREX) 0.3 % ophthalmic solution Place 1 drop into the left eye 4 (four) times daily.     Current Facility-Administered Medications  Medication Dose Route Frequency Provider Last Rate Last Admin   betamethasone acetate-betamethasone sodium phosphate (CELESTONE) injection 3 mg  3 mg Intramuscular Once Edrick Kins, DPM        Allergies:   Ciprofloxacin, Codeine, Hydrocodone, Penicillins, Shellfish allergy, Diltiazem hcl, Sulfonamide derivatives, Morphine and related, Lovastatin, and Metformin   Social History:  The patient  reports that she has quit smoking. She has never used smokeless tobacco. She reports that she does not drink alcohol and does not use drugs.   Family History:  The patient's family history includes Coronary artery disease in an other family member; Diabetes in her mother; Heart  attack in her mother; Hypertension in her mother; Stroke in her father.  ROS:  Please see the history of present illness.    All other systems are reviewed and otherwise negative.   PHYSICAL EXAM:  VS:  There were no vitals taken for this visit. BMI: There is no height or weight on file to calculate BMI. Well nourished, well developed, in no acute distress HEENT: normocephalic, atraumatic Neck: no JVD, carotid bruits or masses Cardiac:  *** RRR; no significant murmurs, no rubs, or gallops Lungs:  *** CTA b/l, no wheezing, rhonchi or rales Abd: soft, nontender MS: no deformity or *** atrophy Ext: *** no edema Skin: warm and dry, no rash Neuro:  No gross deficits appreciated Psych: euthymic mood, full affect  *** PPM site is stable, no tethering or discomfort   EKG:  not done today  Device interrogation done today and reviewed by myself:  ***   01/31/22: TTE 1. Left ventricular ejection fraction, by estimation, is 55 to 60%. The  left ventricle has normal function. The left ventricle has no regional  wall motion abnormalities. Left ventricular diastolic parameters are  consistent with Grade I diastolic  dysfunction (impaired relaxation).   2. Right ventricular systolic function is mildly reduced. The right  ventricular size is mildly enlarged. There is normal pulmonary artery  systolic pressure. The estimated right ventricular systolic pressure is  61.4 mmHg.   3. The mitral valve  is normal in structure. Trivial mitral valve  regurgitation.   4. The aortic valve is tricuspid. Aortic valve regurgitation is trivial.  Aortic valve sclerosis/calcification is present, without any evidence of  aortic stenosis.   5. The inferior vena cava is normal in size with greater than 50%  respiratory variability, suggesting right atrial pressure of 3 mmHg.   Recent Labs: 07/07/2021: B Natriuretic Peptide 271.8; Hemoglobin 11.3; Platelets 172 01/31/2022: BUN 29; Creatinine, Ser 1.65; Potassium 4.6; Sodium 138  No results found for requested labs within last 365 days.   CrCl cannot be calculated (Patient's most recent lab result is older than the maximum 21 days allowed.).   Wt Readings from Last 3 Encounters:  12/29/21 198 lb 6.4 oz (90 kg)  09/14/21 191 lb 12.8 oz (87 kg)  04/06/21 202 lb 9.6 oz (91.9 kg)     Other studies reviewed: Additional studies/records reviewed today include: summarized above  ASSESSMENT AND PLAN:  PPM ***  CAD ***  HFpEF *** Unable to use SGLT2i 2/2 hx of yeast infections ***   HTN ***    Disposition: F/u with ***  Current medicines are reviewed at length with the patient today.  The patient did not have any concerns regarding medicines.  Venetia Night, PA-C 03/10/2022 12:10 PM     [address] [place] [address] [phone] (office)  [phone] (fax)

## 2022-03-11 ENCOUNTER — Encounter: Payer: Medicare Other | Admitting: Physician Assistant

## 2022-03-17 ENCOUNTER — Ambulatory Visit: Payer: Medicare Other | Admitting: Family Medicine

## 2022-03-31 ENCOUNTER — Ambulatory Visit (INDEPENDENT_AMBULATORY_CARE_PROVIDER_SITE_OTHER): Payer: Medicare Other | Admitting: Family Medicine

## 2022-03-31 ENCOUNTER — Ambulatory Visit: Payer: Medicare Other | Admitting: Family Medicine

## 2022-03-31 VITALS — BP 144/70 | HR 60 | Temp 97.5°F | Resp 20 | Ht 67.0 in | Wt 200.4 lb

## 2022-03-31 DIAGNOSIS — N289 Disorder of kidney and ureter, unspecified: Secondary | ICD-10-CM

## 2022-03-31 DIAGNOSIS — L299 Pruritus, unspecified: Secondary | ICD-10-CM | POA: Diagnosis not present

## 2022-03-31 LAB — COMPREHENSIVE METABOLIC PANEL
ALT: 13 U/L (ref 0–35)
AST: 17 U/L (ref 0–37)
Albumin: 3.8 g/dL (ref 3.5–5.2)
Alkaline Phosphatase: 102 U/L (ref 39–117)
BUN: 26 mg/dL — ABNORMAL HIGH (ref 6–23)
CO2: 29 mEq/L (ref 19–32)
Calcium: 9 mg/dL (ref 8.4–10.5)
Chloride: 103 mEq/L (ref 96–112)
Creatinine, Ser: 1.6 mg/dL — ABNORMAL HIGH (ref 0.40–1.20)
GFR: 28.84 mL/min — ABNORMAL LOW (ref >60.00)
Glucose, Bld: 152 mg/dL — ABNORMAL HIGH (ref 70–99)
Potassium: 4.5 mEq/L (ref 3.5–5.1)
Sodium: 140 mEq/L (ref 135–145)
Total Bilirubin: 0.6 mg/dL (ref 0.2–1.2)
Total Protein: 6.3 g/dL (ref 6.0–8.3)

## 2022-03-31 LAB — VITAMIN D 25 HYDROXY (VIT D DEFICIENCY, FRACTURES): VITD: 41.98 ng/mL (ref 30.00–100.00)

## 2022-03-31 LAB — TSH: TSH: 4.78 u[IU]/mL (ref 0.35–5.50)

## 2022-03-31 LAB — VITAMIN B12: Vitamin B-12: 318 pg/mL (ref 211–911)

## 2022-03-31 MED ORDER — TRIAMCINOLONE ACETONIDE 0.1 % EX CREA: 1.0000 | TOPICAL_CREAM | Freq: Two times a day (BID) | CUTANEOUS | 0 refills | Status: DC

## 2022-03-31 NOTE — Patient Instructions (Signed)
SARNA LOTION  Pruritus Pruritus is an itchy feeling on the skin. One of the most common causes is dry skin, but many different things can cause itching. Most cases of itching do not require medical attention. Sometimes itchy skin can turn into a rash or a secondary infection. Follow these instructions at home: Skin care  Do not use scented soaps, detergents, perfumes, and cosmetic products. Instead, use gentle, unscented versions of these items. Apply moisturizing creams to your skin frequently, at least twice daily. Apply immediately after bathing while skin is still wet. Take medicines or apply medicated creams only as told by your health care provider. This may include: Corticosteroid cream or topical calcineurin inhibitor. Anti-itch lotions containing urea, camphor, or menthol. Oral antihistamines. Do not take hot showers or baths, which can make itching worse. A short, cool shower may help with itching as long as you apply moisturizing lotion after the shower. Apply a cool, wet cloth (cool compress) to the affected areas. You may take lukewarm baths with one of the following: Epsom salts. You can get these at your local pharmacy or grocery store. Follow the instructions on the packaging. Baking soda. Pour a small amount into the bath as told by your health care provider. Colloidal oatmeal. You can get this at your local pharmacy or grocery store. Follow the instructions on the packaging. Do not scratch your skin. General instructions Avoid wearing tight clothes. Keep a journal to help find out what is causing your itching. Write down: What you eat and drink. What cosmetic products you use. What soaps or detergents you use. What you wear, including jewelry. Use a humidifier. This keeps the air moist, which helps to prevent dry skin. Be aware of any changes in your itchiness. Tell your health care provider about any changes. Contact a health care provider if: The itching does not go  away after several days. You notice redness, warmth, or drainage on the skin where you have scratched. You are unusually thirsty or urinating more than normal. Your skin tingles or feels numb. Your skin or the white parts of your eyes turn yellow (jaundice). You feel weak. You have any of the following: Night sweats. Tiredness (fatigue). Weight loss. Abdominal pain. Summary Pruritus is an itchy feeling on the skin. One of the most common causes is dry skin, but many different conditions and factors can cause itching. Apply moisturizing creams to your skin frequently, at least twice daily. Apply immediately after bathing while skin is still wet. Take medicines or apply medicated creams only as told by your health care provider. Do not take hot showers or baths. Do not use scented soaps, detergents, perfumes, or cosmetic products. Keep a journal to help find out what is causing your itching. This information is not intended to replace advice given to you by your health care provider. Make sure you discuss any questions you have with your health care provider. Document Revised: 05/05/2021 Document Reviewed: 05/05/2021 Elsevier Patient Education  2023 Elsevier Inc.  

## 2022-03-31 NOTE — Progress Notes (Signed)
Subjective:   By signing my name below, I, [name], attest that this documentation has been prepared under the direction and in the presence of Roma Schanz, 03/31/2022.   Patient ID: [name], female    DOB: Aug 18, 1932, 88 y.o.   MRN: [id number]  Chief Complaint  Patient presents with   New Patient (Initial Visit)    HPI Patient is in today for an establishment of care.  Itching Patient is complaining of having to itch all over her body occurring a month ago. She states that she has scars from scratching at night; she thinks she may scratch in her sleep. She reports that sometimes she has difficulty sleeping due to the itching. Patient especially itches her arms and back. She explains that she went to the doctor for this issue and was prescribed medication for a few days that helped, but didn't completely resolve problem. She confirms associated rash with the itching. Patient denies any changes in medications. She manages symptoms with lotion, but didn't this morning.  Health Maintenance Due  Topic Date Due   OPHTHALMOLOGY EXAM  Never done   Zoster Vaccines- Shingrix (1 of 2) Never done   Pneumonia Vaccine 64+ Years old (2 - PCV) 09/09/2009   HEMOGLOBIN A1C  06/01/2014   DTaP/Tdap/Td (2 - Tdap) 09/10/2018    Past Medical History:  Diagnosis Date   Allergic rhinitis    Anemia    Anxiety    Barrett esophagus    CAD (coronary artery disease) 2009   a. Multivessel s/p PCI w/DES 2009 // b. s/p CABG 2011  //  c. LHC 8/15: pLAD 95 ISR, LCx 100, pOM1 40, dRCA 100, S-OM1/OM2 ok, S-D1 ok, S-PDA ok, L-LAD ok, EF 60%   Carotid artery disease ([place])    a. Carotid US 1/77: RICA 9-39%; LICA 03-00% >> FU 1 year  //  b. Carotid US 9/17: R 1-39%, L 40-59% >> FU 1 year   Chronic diastolic heart failure (HCC)    CKD (chronic kidney disease), stage II    GFR 60-89 ml/min   Depression    Disc disease, degenerative, cervical    Diverticulosis    Gastroparesis    GERD  (gastroesophageal reflux disease)    Gout    H/O hiatal hernia    Helicobacter pylori gastritis    History of echocardiogram    a. Echo 11/13: EF 55% to 60%. Grade 2 diastolic dysfunction, MAC, trivial MR, mild LAE, normal RVSF, mild RAE, PASP 39 mmHg  //  b. Echo 4/17: EF 55-60%, normal wall motion, trivial AI, MAC, moderate LAE, mild RVE, PASP 35 mmHg   History of thrombocytopenia    HTN (hypertension)    Hyperlipidemia    Hypothyroidism    LBP (low back pain)    Lumbar disc disease/lumbar spinal stenosis   Morbid obesity (HCC)    Myocardial infarction ([place])    Osteoarthritis    Osteopenia    PVD (peripheral vascular disease) ([place])    Sick sinus syndrome ([place])    MDT Dual-chamber PPM implant 02/2012   Type II or unspecified type diabetes mellitus without mention of complication, not stated as uncontrolled     Past Surgical History:  Procedure Laterality Date   ABDOMINAL HYSTERECTOMY     CARDIAC CATHETERIZATION     2011  DR COOPER (APPT NEXT WEEK)   CHOLECYSTECTOMY     CORONARY ARTERY BYPASS GRAFT  2011   LIMA-LAD, SVG-DIAG, SVG-OM1-OM2, SVG-PDA   CORONARY STENT PLACEMENT  Drug-eluting stent to the left anterior descending, circumflex and right coronary artery in Jan 2009   EYE SURGERY     BIL CATARACT REMOVAL 06/2010   LEFT HEART CATHETERIZATION WITH CORONARY ANGIOGRAM N/A 12/02/2013   Procedure: LEFT HEART CATHETERIZATION WITH CORONARY ANGIOGRAM;  Surgeon: Sinclair Grooms, MD;  Location: [facility] CATH LAB;  Service: Cardiovascular;  Laterality: N/A;   OVARIAN CYST REMOVAL     PACEMAKER INSERTION  03/06/12   MDT Adapta L implanted by Dr Rayann Heman for SSS   PERMANENT PACEMAKER INSERTION N/A 03/06/2012   Procedure: PERMANENT PACEMAKER INSERTION;  Surgeon: Thompson Grayer, MD;  Location: [facility] CATH LAB;  Service: Cardiovascular;  Laterality: N/A;   SHOULDER ARTHROSCOPY  06/16/2011   Procedure: ARTHROSCOPY SHOULDER;  Surgeon: Sharmon Revere, MD;  Location: [facility];  Service: Orthopedics;   Laterality: Left;  LEFT SHOULDER ARTHROSCOPY ACROMIALPLASTY, POSSIBLE MINI OPEN CUFF REPAIR    TUBAL LIGATION      Family History  Problem Relation Age of Onset   Diabetes Mother    Hypertension Mother    Heart attack Mother    Stroke Father    Coronary artery disease Other     Social History   Socioeconomic History   Marital status: Widowed    Spouse name: Not on file   Number of children: Not on file   Years of education: Not on file   Highest education level: Not on file  Occupational History   Occupation: RETIRED LPN  Tobacco Use   Smoking status: Former   Smokeless tobacco: Never   Tobacco comments:    quit 30 yrs ago  Vaping Use   Vaping Use: Never used  Substance and Sexual Activity   Alcohol use: No   Drug use: No   Sexual activity: Not on file  Other Topics Concern   Not on file  Social History Narrative   Widowed 2004.., Lives alone..Family history is negative for premature coronary artery disease. Mother died at age 72 with heart disease in her later years, father died at age 36 from a stroke.Marland KitchenShe  has 8 siblings, none of whom have coronary artery disease.         Social Determinants of Health   Financial Resource Strain: Low Risk  (11/01/2018)   Overall Financial Resource Strain (CARDIA)    Difficulty of Paying Living Expenses: Not hard at all  Food Insecurity: No Food Insecurity (08/12/2021)   Hunger Vital Sign    Worried About Running Out of Food in the Last Year: Never true    Ran Out of Food in the Last Year: Never true  Transportation Needs: No Transportation Needs (08/12/2021)   PRAPARE - Hydrologist (Medical): No    Lack of Transportation (Non-Medical): No  Physical Activity: Insufficiently Active (02/01/2021)   Exercise Vital Sign    Days of Exercise per Week: 5 days    Minutes of Exercise per Session: 20 min  Stress: No Stress Concern Present (05/04/2021)   [place]    Feeling of Stress : Only a little  Social Connections: Not on file  Intimate Partner Violence: Not on file    Outpatient Medications Prior to Visit  Medication Sig Dispense Refill   acetaminophen (TYLENOL) 500 MG tablet Take 500 mg by mouth every 6 (six) hours as needed for headache (pain).     allopurinol (ZYLOPRIM) 100 MG tablet Take 100 mg by mouth at bedtime.  amLODipine (NORVASC) 5 MG tablet Take 5 mg by mouth 2 (two) times daily.     atorvastatin (LIPITOR) 20 MG tablet TAKE 1 TABLET BY MOUTH ONCE DAILY 90 tablet 2   azelastine (OPTIVAR) 0.05 % ophthalmic solution Place 1 drop into both eyes 2 (two) times daily.     carvedilol (COREG) 12.5 MG tablet TAKE 1 TABLET BY MOUTH TWICE  DAILY 200 tablet 2   clopidogrel (PLAVIX) 75 MG tablet TAKE 1 TABLET BY MOUTH  DAILY 90 tablet 3   COVID-19 mRNA vaccine 2023-2024 (COMIRNATY) syringe Inject into the muscle. 0.3 mL 0   ENTRESTO 97-103 MG TAKE 1 TABLET BY MOUTH TWICE  DAILY 180 tablet 3   furosemide (LASIX) 20 MG tablet Take 1 tablet by mouth daily 90 tablet 3   influenza vaccine adjuvanted (FLUAD QUADRIVALENT) 0.5 ML injection Inject into the muscle. 0.5 mL 0   insulin NPH Human (HUMULIN N,NOVOLIN N) 100 UNIT/ML injection Inject 5-45 Units into the skin See admin instructions. Inject 45 units subcutaneously every morning and 10 units at night     Multiple Vitamin (MULTIVITAMIN WITH MINERALS) TABS tablet Take 1 tablet by mouth at bedtime.      nitroGLYCERIN (NITROSTAT) 0.4 MG SL tablet DISSOLVE ONE TABLET UNDER THE TONGUE EVERY 5 MINUTES AS NEEDED FOR CHEST PAIN.  DO NOT EXCEED A TOTAL OF 3 DOSES IN 15 MINUTES 25 tablet 3   omeprazole (PRILOSEC OTC) 20 MG tablet Take 20 mg by mouth daily.     ONE TOUCH ULTRA TEST test strip 1 each by Other route daily as needed (blood sugar).      pantoprazole (PROTONIX) 40 MG tablet Take 40 mg by mouth daily.     potassium chloride (KLOR-CON) 10 MEQ tablet Take 1 tablet (10 mEq  total) by mouth daily. 90 tablet 3   tobramycin (TOBREX) 0.3 % ophthalmic solution Place 1 drop into the left eye 4 (four) times daily.     Facility-Administered Medications Prior to Visit  Medication Dose Route Frequency Provider Last Rate Last Admin   betamethasone acetate-betamethasone sodium phosphate (CELESTONE) injection 3 mg  3 mg Intramuscular Once Edrick Kins, DPM        Allergies  Allergen Reactions   Ciprofloxacin Nausea And Vomiting and Other (See Comments)    syncope   Codeine Other (See Comments)    HALLUCINATIONS   Hydrocodone Other (See Comments)    Makes her pass out   Penicillins Hives    Has patient had a PCN reaction causing immediate rash, facial/tongue/throat swelling, SOB or lightheadedness with hypotension: No Has patient had a PCN reaction causing severe rash involving mucus membranes or skin necrosis: Yes Has patient had a PCN reaction that required hospitalization: in the hospital at time of reaction Has patient had a PCN reaction occurring within the last 10 years: Unknown If all of the above answers are "NO", then may proceed with Cephalosporin use.   Shellfish Allergy Hives   Diltiazem Hcl Other (See Comments)    : low heart rate   Sulfonamide Derivatives Nausea And Vomiting   Morphine And Related Other (See Comments)    Went crazy   Lovastatin Other (See Comments)    Unknown reaction   Metformin Diarrhea    Review of Systems  Constitutional:  Negative for fever and malaise/fatigue.  HENT:  Negative for congestion.   Eyes:  Negative for blurred vision.  Respiratory:  Negative for shortness of breath.   Cardiovascular:  Negative for chest pain,  palpitations and leg swelling.  Gastrointestinal:  Negative for abdominal pain, blood in stool and nausea.  Genitourinary:  Negative for dysuria and frequency.  Musculoskeletal:  Negative for falls.  Skin:  Positive for itching and rash.  Neurological:  Negative for dizziness, loss of consciousness and  headaches.  Endo/Heme/Allergies:  Negative for environmental allergies.  Psychiatric/Behavioral:  Negative for depression. The patient is not nervous/anxious.        Objective:    Physical Exam Vitals reviewed.  Constitutional:      General: She is not in acute distress.    Appearance: Normal appearance. She is not ill-appearing.  HENT:     Head: Normocephalic and atraumatic.     Right Ear: External ear normal.     Left Ear: External ear normal.  Eyes:     Extraocular Movements: Extraocular movements intact.     Pupils: Pupils are equal, round, and reactive to light.  Cardiovascular:     Rate and Rhythm: Normal rate and regular rhythm.     Heart sounds: Normal heart sounds. No murmur heard.    No gallop.  Pulmonary:     Effort: Pulmonary effort is normal. No respiratory distress.     Breath sounds: Normal breath sounds. No wheezing or rales.  Skin:    General: Skin is warm and dry.  Neurological:     Mental Status: She is alert and oriented to person, place, and time.  Psychiatric:        Judgment: Judgment normal.     BP (!) 144/70 (BP Location: Left Arm, Patient Position: Sitting, Cuff Size: Large)   Pulse 60   Temp (!) 97.5 F (36.4 C) (Oral)   Resp 20   Ht _0  (1.702 m)   Wt 200 lb 6.4 oz (90.9 kg)   SpO2 98%   BMI 31.39 kg/m  Wt Readings from Last 3 Encounters:  03/31/22 200 lb 6.4 oz (90.9 kg)  12/29/21 198 lb 6.4 oz (90 kg)  09/14/21 191 lb 12.8 oz (87 kg)       Assessment & Plan:   Problem List Items Addressed This Visit   None Visit Diagnoses     Pruritus    -  Primary   Relevant Medications   triamcinolone cream (KENALOG) 0.1 %   Other Relevant Orders   Comprehensive metabolic panel (Completed)   TSH (Completed)   VITAMIN D 25 Hydroxy (Vit-D Deficiency, Fractures) (Completed)   Vitamin B12 (Completed)   Ambulatory referral to Nephrology   Renal insufficiency       Relevant Orders   Ambulatory referral to Nephrology      Meds  ordered this encounter  Medications   triamcinolone cream (KENALOG) 0.1 %    Sig: Apply 1 Application topically 2 (two) times daily.    Dispense:  30 g    Refill:  0    I, Roma Schanz, personally preformed the services described in this documentation.  All medical record entries made by the scribe were at my direction and in my presence.  I have reviewed the chart and discharge instructions (if applicable) and agree that the record reflects my personal performance and is accurate and complete. 03/31/2022.   I,Verona Buck,acting as a Education administrator for [employer], DO.,have documented all relevant documentation on the behalf of [name], DO,as directed by  [name], DO while in the presence of [name], DO.    [name], DO

## 2022-04-05 ENCOUNTER — Encounter: Payer: Self-pay | Admitting: Family Medicine

## 2022-04-05 ENCOUNTER — Ambulatory Visit (INDEPENDENT_AMBULATORY_CARE_PROVIDER_SITE_OTHER): Payer: Medicare Other

## 2022-04-05 DIAGNOSIS — I495 Sick sinus syndrome: Secondary | ICD-10-CM

## 2022-04-06 LAB — CUP PACEART REMOTE DEVICE CHECK
Battery Impedance: 2440 Ohm
Battery Remaining Longevity: 25 mo
Battery Voltage: 2.73 V
Brady Statistic AP VP Percent: 93 %
Brady Statistic AP VS Percent: 0 %
Brady Statistic AS VP Percent: 7 %
Brady Statistic AS VS Percent: 0 %
Date Time Interrogation Session: 20231227145954
Implantable Lead Connection Status: 753985
Implantable Lead Connection Status: 753985
Implantable Lead Implant Date: 20131126
Implantable Lead Implant Date: 20131126
Implantable Lead Location: 753859
Implantable Lead Location: 753860
Implantable Lead Model: 5076
Implantable Lead Model: 5092
Implantable Pulse Generator Implant Date: 20131126
Lead Channel Impedance Value: 460 Ohm
Lead Channel Impedance Value: 466 Ohm
Lead Channel Pacing Threshold Amplitude: 0.625 V
Lead Channel Pacing Threshold Amplitude: 1 V
Lead Channel Pacing Threshold Pulse Width: 0.4 ms
Lead Channel Pacing Threshold Pulse Width: 0.4 ms
Lead Channel Setting Pacing Amplitude: 2 V
Lead Channel Setting Pacing Amplitude: 2.5 V
Lead Channel Setting Pacing Pulse Width: 0.4 ms
Lead Channel Setting Sensing Sensitivity: 2 mV
Zone Setting Status: 755011
Zone Setting Status: 755011

## 2022-04-18 ENCOUNTER — Telehealth: Payer: Self-pay | Admitting: Family Medicine

## 2022-04-18 NOTE — Telephone Encounter (Signed)
Pt stated she thought she was supposed to be getting  a dermatology referral. Please advise pt with the name and number.

## 2022-04-21 NOTE — Telephone Encounter (Signed)
Pt called. LDVM regarding referral.

## 2022-04-27 ENCOUNTER — Ambulatory Visit (INDEPENDENT_AMBULATORY_CARE_PROVIDER_SITE_OTHER): Payer: Medicare Other | Admitting: Family Medicine

## 2022-04-27 ENCOUNTER — Encounter: Payer: Self-pay | Admitting: Family Medicine

## 2022-04-27 VITALS — BP 165/67 | HR 60 | Temp 97.7°F | Resp 20 | Ht 67.0 in | Wt 210.2 lb

## 2022-04-27 DIAGNOSIS — Z1152 Encounter for screening for COVID-19: Secondary | ICD-10-CM | POA: Diagnosis not present

## 2022-04-27 DIAGNOSIS — R6889 Other general symptoms and signs: Secondary | ICD-10-CM | POA: Diagnosis not present

## 2022-04-27 LAB — POC COVID19 BINAXNOW: SARS Coronavirus 2 Ag: NEGATIVE — AB

## 2022-04-27 LAB — POCT INFLUENZA A/B
Influenza A, POC: NEGATIVE
Influenza B, POC: NEGATIVE

## 2022-04-27 MED ORDER — OSELTAMIVIR PHOSPHATE 30 MG PO CAPS: 30.0000 mg | ORAL_CAPSULE | Freq: Every day | ORAL | 0 refills | Status: AC

## 2022-04-27 NOTE — Patient Instructions (Addendum)
COVID and Flu testing negative today, but since daughter has the flu, I think you do as well (just might be too early for an accurate test since your symptoms have been present for less than 12 hours so far). Because of you kidney function, I have to do a lower dose of the Tamiflu. Make sure you are staying well hydrated. Continue supportive measures including rest, hydration, humidifier use, steam showers, warm compresses to sinuses, warm liquids with lemon and honey, and over-the-counter cough, cold, and analgesics as needed.   Please contact office for follow-up if symptoms do not improve or worsen. Seek emergency care if symptoms become severe.

## 2022-04-27 NOTE — Progress Notes (Signed)
Acute Office Visit  Subjective:     Patient ID: [name], female    DOB: Sep 07, 1932, 89 y.o.   MRN: [id number]  Chief Complaint  Patient presents with   Nasal Congestion   Cough   Sore Throat    HPI   Upper Respiratory Infection: Patient complains of symptoms of a URI. Symptoms include congestion, cough, fever, sore throat, and fatigue, malaise, body aches, chills. . States "it feels like i'e been hit by a truck." Onset of symptoms was  3am this morning (less than 12 hours ago)  , gradually worsening since that time. .  She is drinking plenty of fluids. Evaluation to date: none. Treatment to date: none. Her daughter (close contact) tested positive for Flu yesterday.      ROS All review of systems negative except what is listed in the HPI      Objective:    BP (!) 165/67   Pulse 60   Temp 97.7 F (36.5 C)   Resp 20   Ht 5\' 7"  (1.702 m)   Wt 210 lb 3.2 oz (95.3 kg)   SpO2 100%   BMI 32.92 kg/m    Physical Exam Vitals reviewed.  Constitutional:      General: She is not in acute distress.    Appearance: She is well-developed.  HENT:     Head: Normocephalic and atraumatic.  Eyes:     Conjunctiva/sclera: Conjunctivae normal.  Cardiovascular:     Rate and Rhythm: Normal rate and regular rhythm.  Pulmonary:     Effort: Pulmonary effort is normal.     Breath sounds: Normal breath sounds. No wheezing, rhonchi or rales.  Musculoskeletal:     Cervical back: Normal range of motion and neck supple.  Neurological:     Mental Status: She is alert.     Results for orders placed or performed in visit on 04/27/22  POC COVID-19  Result Value Ref Range   SARS Coronavirus 2 Ag Negative (A) Negative  POCT Influenza A/B  Result Value Ref Range   Influenza A, POC Negative Negative   Influenza B, POC Negative Negative        Assessment & Plan:   Problem List Items Addressed This Visit   None Visit Diagnoses     Flu-like symptoms    -  Primary    Relevant Medications   oseltamivir (TAMIFLU) 30 MG capsule   Other Relevant Orders   POCT Influenza A/B (Completed)   Encounter for screening for COVID-19       Relevant Orders   POC COVID-19 (Completed)     COVID and Flu testing negative today, but since daughter has the flu, I think you do as well (just might be too early for an accurate test since your symptoms have been present for less than 12 hours so far). Because of your kidney function, I have to do a lower dose of the Tamiflu. Make sure you are staying well hydrated. Continue supportive measures including rest, hydration, humidifier use, steam showers, warm compresses to sinuses, warm liquids with lemon and honey, and over-the-counter cough, cold, and analgesics as needed.   Monitor BP at home, if persistently >140/90 please scheduled PCP follow-up.  Please contact office for follow-up if symptoms do not improve or worsen. Seek emergency care if symptoms become severe.      Meds ordered this encounter  Medications   oseltamivir (TAMIFLU) 30 MG capsule    Sig: Take 1 capsule (30 mg total) by  mouth daily for 5 days.    Dispense:  5 capsule    Refill:  0    Order Specific Question:   Supervising Provider    Answer:   Penni Homans A [[id number]]    Return if symptoms worsen or fail to improve.  Terrilyn Saver, NP

## 2022-04-28 NOTE — Progress Notes (Signed)
Remote pacemaker transmission.   

## 2022-05-04 DIAGNOSIS — H353111 Nonexudative age-related macular degeneration, right eye, early dry stage: Secondary | ICD-10-CM | POA: Diagnosis not present

## 2022-05-04 DIAGNOSIS — H35371 Puckering of macula, right eye: Secondary | ICD-10-CM | POA: Diagnosis not present

## 2022-05-04 DIAGNOSIS — E113293 Type 2 diabetes mellitus with mild nonproliferative diabetic retinopathy without macular edema, bilateral: Secondary | ICD-10-CM | POA: Diagnosis not present

## 2022-05-04 DIAGNOSIS — H353221 Exudative age-related macular degeneration, left eye, with active choroidal neovascularization: Secondary | ICD-10-CM | POA: Diagnosis not present

## 2022-05-04 LAB — HM DIABETES EYE EXAM

## 2022-05-09 NOTE — Progress Notes (Unsigned)
Cardiology Office Note:    Date:  05/10/2022   ID:  [name], DOB 07-15-1932, MRN [id number]  PCP:  Carollee Herter, [place] Prov[facility] Cardiologist:  Sherren Mocha, MD Cardiology APP:  Liliane Shi, PA-C  Electrophysiologist:  Thompson Grayer, MD     Referring MD: Carollee Herter, Alferd Apa, *   Patient Profile: Coronary artery disease  S/p CABG in 2011 LHC 12/02/2013: SVG-OM1/OM2, SVG-D1, SVG-PDA, LIMA-LAD patent, EF 60 Myoview 11/24/2015: EF 60, no ischemia, no infarct; low risk SSS s/p PPM (HFpEF) heart failure with preserved ejection fraction  TTE 07/13/2015: EF 55-60, normal wall motion, trivial AI, MAC, moderate LAE, PASP 35 TTE 01/31/2022: EF 55-60, no RWMA, GR 1 DD, mild reduced RVSF, normal PASP (RVSP 26.2), trivial MR, trivial AI, AV sclerosis without stenosis, RAP 3 Intol of SGLT2i (yeast infx); Spiro (diarrhea)   Hypertension Hyperlipidemia  Diabetes mellitus  Chronic kidney disease  Peripheral arterial disease  GERD/Barrett's esophagus Carotid artery disease [place] 10/19: Bilateral ICA 1-39 Hypothyroidism   History of Present Illness:   [name] is a 89 y.o. female with the above problem list.  She was last seen by Dr. Burt Knack 12/29/2021.  She was volume overloaded and her furosemide was adjusted.  Creatinine was somewhat increased on follow-up labs but overall stable.  Her creatinine has increased since then and her primary care provider has referred her to nephrology.  She returns for follow-up on CHF, CAD.  She is here alone.  She continues to have shortness of breath with minimal activity.  She has not had chest pain.  She has chronic lower extremity edema.  She uses a walker to ambulate.  She has not had syncope or near syncope.       Reviewed and updated this encounter:   Tobacco  Allergies  Meds  Problems  Med Hx  Surg Hx  Fam Hx     ROS  Labs/Other Test Reviewed:   Recent Labs: 07/07/2021: B Natriuretic Peptide 271.8;  Hemoglobin 11.3; Platelets 172 03/31/2022: ALT 13; BUN 26; Creatinine, Ser 1.60; Potassium 4.5; Sodium 140; TSH 4.78   Recent Lipid Panel No results for input(s): "CHOL", "TRIG", "HDL", "VLDL", "LDLCALC", "LDLDIRECT" in the last 8760 hours.   Risk Assessment/Calculations/Metrics:         HYPERTENSION CONTROL Vitals:   05/10/22 0906 05/10/22 0951  BP: (!) 168/74 (!) 156/58    The patient's blood pressure is elevated above target today.  In order to address the patient's elevated BP: A current anti-hypertensive medication was adjusted today.      Physical Exam:   VS:  BP (!) 156/58   Pulse 95   Ht 5' 7"  (1.702 m)   Wt 203 lb (92.1 kg)   SpO2 (!) 60%   BMI 31.79 kg/m    Wt Readings from Last 3 Encounters:  05/10/22 203 lb (92.1 kg)  04/27/22 210 lb 3.2 oz (95.3 kg)  03/31/22 200 lb 6.4 oz (90.9 kg)    Constitutional:      Appearance: Healthy appearance. Not in distress.  Pulmonary:     Breath sounds: Normal breath sounds. No rales.  Cardiovascular:     Normal rate. Regular rhythm.     Murmurs: There is no murmur.  Edema:    Peripheral edema present.    Pretibial: bilateral 1+ edema of the pretibial area. Abdominal:     Palpations: Abdomen is soft.  Skin:    General: Skin is warm and dry.  ASSESSMENT & PLAN:   (HFpEF) heart failure with preserved ejection fraction ([place]) EF 55-60 by TTE in 01/2022. NYHA III. She continues to have signs of volume excess on exam. She is on max dose Entresto. She cannot tolerate SGLT2 inhib's or MRAs. She is pending referral to Nephrology.  Continue Entresto 97/103 mg twice daily Increase furosemide to 40 mg daily BMET 1 week Follow-up 6 months  Coronary artery disease involving native coronary artery of native heart without angina pectoris History of CABG in 2011.  Bypass grafts were patent at cardiac catheterization in 11/2013.  Nuclear stress test was low risk in 2017.  She is not having anginal symptoms.  Continue Lipitor  20 mg daily, Plavix 75 mg daily, nitroglycerin as needed.  Hyperlipidemia LDL goal <70 LDL optimal on most recent lab work.  Continue current Rx with Lipitor 20 mg daily.    Essential hypertension BP uncontrolled. She has not had any medications yet today. She does note SBPs at home in the 150s.  Continue amlodipine 5 mg twice daily, carvedilol 12.5 mg twice daily, Entresto 97/103 mg twice daily Increase furosemide to 40 mg daily BMET 1 week  CKD (chronic kidney disease), stage III ([place]) As noted the primary care has referred her to nephrology.  Arrange repeat BMET in 1 week after increasing furosemide.  Cardiac pacemaker in situ F/u with EP as planned.             Dispo:  Return in about 6 months (around 11/08/2022) for Routine Follow Up, w/ Dr. Burt Knack.     Signed, Richardson Dopp, PA-C  05/10/2022 10:00 AM    [facility] [place], [place], [place]  [address] Phone: [phone]; Fax: [phone]

## 2022-05-10 ENCOUNTER — Ambulatory Visit: Payer: Medicare Other | Attending: Physician Assistant | Admitting: Physician Assistant

## 2022-05-10 ENCOUNTER — Encounter: Payer: Self-pay | Admitting: Physician Assistant

## 2022-05-10 VITALS — BP 156/58 | HR 95 | Ht 67.0 in | Wt 203.0 lb

## 2022-05-10 DIAGNOSIS — Z950 Presence of cardiac pacemaker: Secondary | ICD-10-CM

## 2022-05-10 DIAGNOSIS — I5032 Chronic diastolic (congestive) heart failure: Secondary | ICD-10-CM | POA: Diagnosis not present

## 2022-05-10 DIAGNOSIS — N1832 Chronic kidney disease, stage 3b: Secondary | ICD-10-CM

## 2022-05-10 DIAGNOSIS — I10 Essential (primary) hypertension: Secondary | ICD-10-CM

## 2022-05-10 DIAGNOSIS — I2510 Atherosclerotic heart disease of native coronary artery without angina pectoris: Secondary | ICD-10-CM | POA: Diagnosis not present

## 2022-05-10 DIAGNOSIS — E785 Hyperlipidemia, unspecified: Secondary | ICD-10-CM | POA: Diagnosis not present

## 2022-05-10 MED ORDER — FUROSEMIDE 40 MG PO TABS: 40.0000 mg | ORAL_TABLET | Freq: Every day | ORAL | 3 refills | Status: DC

## 2022-05-10 NOTE — Assessment & Plan Note (Signed)
History of CABG in 2011.  Bypass grafts were patent at cardiac catheterization in 11/2013.  Nuclear stress test was low risk in 2017.  She is not having anginal symptoms.  Continue Lipitor 20 mg daily, Plavix 75 mg daily, nitroglycerin as needed.

## 2022-05-10 NOTE — Assessment & Plan Note (Signed)
LDL optimal on most recent lab work.  Continue current Rx with Lipitor 20 mg daily.

## 2022-05-10 NOTE — Assessment & Plan Note (Signed)
F/u with EP as planned.  

## 2022-05-10 NOTE — Assessment & Plan Note (Signed)
As noted the primary care has referred her to nephrology.  Arrange repeat BMET in 1 week after increasing furosemide.

## 2022-05-10 NOTE — Assessment & Plan Note (Signed)
BP uncontrolled. She has not had any medications yet today. She does note SBPs at home in the 150s.  Continue amlodipine 5 mg twice daily, carvedilol 12.5 mg twice daily, Entresto 97/103 mg twice daily Increase furosemide to 40 mg daily BMET 1 week

## 2022-05-10 NOTE — Patient Instructions (Addendum)
Medication Instructions:  1.Increase lasix to 40 mg daily *If you need a refill on your cardiac medications before your next appointment, please call your pharmacy*   Lab Work: BMET in 1 week If you have labs (blood work) drawn today and your tests are completely normal, you will receive your results only by: [place] (if you have MyChart) OR A paper copy in the mail If you have any lab test that is abnormal or we need to change your treatment, we will call you to review the results.   Follow-Up: At [facility], you and your health needs are our priority.  As part of our continuing mission to provide you with exceptional heart care, we have created designated Provider Care Teams.  These Care Teams include your primary Cardiologist (physician) and Advanced Practice Providers (APPs -  Physician Assistants and Nurse Practitioners) who all work together to provide you with the care you need, when you need it.  We recommend signing up for the patient portal called "MyChart".  Sign up information is provided on this After Visit Summary.  MyChart is used to connect with patients for Virtual Visits (Telemedicine).  Patients are able to view lab/test results, encounter notes, upcoming appointments, etc.  Non-urgent messages can be sent to your provider as well.   To learn more about what you can do with MyChart, go to [email or web address].    Your next appointment:   6 month(s)  Provider:   Sherren Mocha, MD

## 2022-05-10 NOTE — Assessment & Plan Note (Signed)
EF 55-60 by TTE in 01/2022. NYHA III. She continues to have signs of volume excess on exam. She is on max dose Entresto. She cannot tolerate SGLT2 inhib's or MRAs. She is pending referral to Nephrology.  Continue Entresto 97/103 mg twice daily Increase furosemide to 40 mg daily BMET 1 week Follow-up 6 months

## 2022-05-17 ENCOUNTER — Ambulatory Visit: Payer: Medicare Other | Attending: Physician Assistant

## 2022-05-17 DIAGNOSIS — I5032 Chronic diastolic (congestive) heart failure: Secondary | ICD-10-CM

## 2022-05-17 DIAGNOSIS — I2510 Atherosclerotic heart disease of native coronary artery without angina pectoris: Secondary | ICD-10-CM | POA: Diagnosis not present

## 2022-05-18 LAB — BASIC METABOLIC PANEL
BUN/Creatinine Ratio: 17 (ref 12–28)
BUN: 27 mg/dL (ref 8–27)
CO2: 22 mmol/L (ref 20–29)
Calcium: 9.2 mg/dL (ref 8.7–10.3)
Chloride: 102 mmol/L (ref 96–106)
Creatinine, Ser: 1.55 mg/dL — ABNORMAL HIGH (ref 0.57–1.00)
Glucose: 220 mg/dL — ABNORMAL HIGH (ref 70–99)
Potassium: 4.6 mmol/L (ref 3.5–5.2)
Sodium: 141 mmol/L (ref 134–144)
eGFR: 32 mL/min/{1.73_m2} — ABNORMAL LOW (ref >59)

## 2022-05-20 DIAGNOSIS — I5030 Unspecified diastolic (congestive) heart failure: Secondary | ICD-10-CM | POA: Diagnosis not present

## 2022-05-20 DIAGNOSIS — I2510 Atherosclerotic heart disease of native coronary artery without angina pectoris: Secondary | ICD-10-CM | POA: Diagnosis not present

## 2022-05-20 DIAGNOSIS — E1129 Type 2 diabetes mellitus with other diabetic kidney complication: Secondary | ICD-10-CM | POA: Diagnosis not present

## 2022-05-20 DIAGNOSIS — I129 Hypertensive chronic kidney disease with stage 1 through stage 4 chronic kidney disease, or unspecified chronic kidney disease: Secondary | ICD-10-CM | POA: Diagnosis not present

## 2022-05-20 DIAGNOSIS — E1122 Type 2 diabetes mellitus with diabetic chronic kidney disease: Secondary | ICD-10-CM | POA: Diagnosis not present

## 2022-05-20 DIAGNOSIS — N390 Urinary tract infection, site not specified: Secondary | ICD-10-CM | POA: Diagnosis not present

## 2022-05-20 DIAGNOSIS — N1832 Chronic kidney disease, stage 3b: Secondary | ICD-10-CM | POA: Diagnosis not present

## 2022-05-24 ENCOUNTER — Telehealth: Payer: Self-pay | Admitting: Cardiovascular Disease

## 2022-05-24 ENCOUNTER — Telehealth: Payer: Self-pay | Admitting: Family Medicine

## 2022-05-24 NOTE — Telephone Encounter (Signed)
Pt states she has a rash all over her body that seems to be turning into boils or "ulcers". She wanted to wait till Friday to see pcp, transferred to triage as well.

## 2022-05-24 NOTE — Telephone Encounter (Signed)
Patient is returning call to discuss lab results. 

## 2022-05-25 NOTE — Telephone Encounter (Signed)
Pt was scheduled for Friday and transferred to triage

## 2022-05-25 NOTE — Telephone Encounter (Signed)
Returned call to patient to inform her that last labs were stable. She states that she is NOT taking Furosemide 34m daily. Says "it's too much, I can't hold my water, and it's everywhere." Condones doing the increased dose for about 2-3 days post visit, but quickly returned to only 268mdaily. Inquired about signs of fluid volume excess and she denies weighing herself at home, denies swelling of lower extremities, but states "shortness of breath is terrible." Advised her that this is due to too much fluid and the only way to rid the body of it is via urination. Asked that she monitor her weights daily at least for the next week and also take 2 tablets daily while doing and report back to [place]. Will forward to ScRichardson DoppPA to make aware.

## 2022-05-25 NOTE — Telephone Encounter (Signed)
Agree. Volume excess is contributing to shortness of breath. Limit Na intake. Limit H20 intake to 60 oz or less. Make sure Lasix is taken as early as possible in the AM. Drug effect should wear off in 6 hours. Richardson Dopp, PA-C    05/25/2022 4:53 PM

## 2022-05-27 ENCOUNTER — Encounter: Payer: Self-pay | Admitting: Family Medicine

## 2022-05-27 ENCOUNTER — Ambulatory Visit (INDEPENDENT_AMBULATORY_CARE_PROVIDER_SITE_OTHER): Payer: Medicare Other | Admitting: Family Medicine

## 2022-05-27 VITALS — BP 110/70 | HR 55 | Temp 97.8°F | Resp 20 | Ht 67.0 in | Wt 199.0 lb

## 2022-05-27 DIAGNOSIS — M159 Polyosteoarthritis, unspecified: Secondary | ICD-10-CM

## 2022-05-27 DIAGNOSIS — L299 Pruritus, unspecified: Secondary | ICD-10-CM

## 2022-05-27 DIAGNOSIS — R21 Rash and other nonspecific skin eruption: Secondary | ICD-10-CM | POA: Diagnosis not present

## 2022-05-27 DIAGNOSIS — E11620 Type 2 diabetes mellitus with diabetic dermatitis: Secondary | ICD-10-CM | POA: Diagnosis not present

## 2022-05-27 DIAGNOSIS — R251 Tremor, unspecified: Secondary | ICD-10-CM

## 2022-05-27 LAB — COMPREHENSIVE METABOLIC PANEL
ALT: 12 U/L (ref 0–35)
AST: 16 U/L (ref 0–37)
Albumin: 3.8 g/dL (ref 3.5–5.2)
Alkaline Phosphatase: 102 U/L (ref 39–117)
BUN: 28 mg/dL — ABNORMAL HIGH (ref 6–23)
CO2: 28 mEq/L (ref 19–32)
Calcium: 9.1 mg/dL (ref 8.4–10.5)
Chloride: 106 mEq/L (ref 96–112)
Creatinine, Ser: 1.35 mg/dL — ABNORMAL HIGH (ref 0.40–1.20)
GFR: 35.32 mL/min — ABNORMAL LOW (ref >60.00)
Glucose, Bld: 152 mg/dL — ABNORMAL HIGH (ref 70–99)
Potassium: 4.3 mEq/L (ref 3.5–5.1)
Sodium: 142 mEq/L (ref 135–145)
Total Bilirubin: 0.6 mg/dL (ref 0.2–1.2)
Total Protein: 6.5 g/dL (ref 6.0–8.3)

## 2022-05-27 LAB — CBC WITH DIFFERENTIAL/PLATELET
Basophils Absolute: 0 10*3/uL (ref 0.0–0.1)
Basophils Relative: 0.4 % (ref 0.0–3.0)
Eosinophils Absolute: 0.2 10*3/uL (ref 0.0–0.7)
Eosinophils Relative: 2.2 % (ref 0.0–5.0)
HCT: 37.1 % (ref 36.0–46.0)
Hemoglobin: 12.2 g/dL (ref 12.0–15.0)
Lymphocytes Relative: 13.9 % (ref 12.0–46.0)
Lymphs Abs: 1.2 10*3/uL (ref 0.7–4.0)
MCHC: 32.8 g/dL (ref 30.0–36.0)
MCV: 95.8 fl (ref 78.0–100.0)
Monocytes Absolute: 0.7 10*3/uL (ref 0.1–1.0)
Monocytes Relative: 7.8 % (ref 3.0–12.0)
Neutro Abs: 6.8 10*3/uL (ref 1.4–7.7)
Neutrophils Relative %: 75.7 % (ref 43.0–77.0)
Platelets: 209 10*3/uL (ref 150.0–400.0)
RBC: 3.87 Mil/uL (ref 3.87–5.11)
RDW: 13.9 % (ref 11.5–15.5)
WBC: 9 10*3/uL (ref 4.0–10.5)

## 2022-05-27 LAB — LIPID PANEL
Cholesterol: 118 mg/dL (ref 0–200)
HDL: 64 mg/dL (ref >39.00)
LDL Cholesterol: 38 mg/dL (ref 0–99)
NonHDL: 54.13
Total CHOL/HDL Ratio: 2
Triglycerides: 82 mg/dL (ref 0.0–149.0)
VLDL: 16.4 mg/dL (ref 0.0–40.0)

## 2022-05-27 LAB — HEMOGLOBIN A1C: Hgb A1c MFr Bld: 7.1 % — ABNORMAL HIGH (ref 4.6–6.5)

## 2022-05-27 MED ORDER — HUMULIN N KWIKPEN 100 UNIT/ML ~~LOC~~ SUPN: PEN_INJECTOR | SUBCUTANEOUS | 11 refills | Status: DC

## 2022-05-27 MED ORDER — PREDNISONE 10 MG PO TABS: ORAL_TABLET | ORAL | 0 refills | Status: DC

## 2022-05-27 MED ORDER — METHYLPREDNISOLONE ACETATE 40 MG/ML IJ SUSP
40.0000 mg | Freq: Once | INTRAMUSCULAR | Status: AC
  Administered 2022-05-27: 40 mg via INTRAMUSCULAR

## 2022-05-27 MED ORDER — INSULIN NPH (HUMAN) (ISOPHANE) 100 UNIT/ML ~~LOC~~ SUSP: 5.0000 [IU] | SUBCUTANEOUS | 1 refills | Status: DC

## 2022-05-27 NOTE — Assessment & Plan Note (Signed)
Benign essential tremor Worsening per pt  Refer to neuro

## 2022-05-27 NOTE — Assessment & Plan Note (Signed)
Depo medrol 40 mg IM Pred taper  Refer to derm

## 2022-05-27 NOTE — Assessment & Plan Note (Addendum)
Multiple joints  Refer to Home Health for help with bathing / dressing

## 2022-05-27 NOTE — Progress Notes (Signed)
Subjective:   By signing my name below, I, [name], attest that this documentation has been prepared under the direction and in the presence of [name], DO 05/27/22   Patient ID: [name], female    DOB: [birth year], 89 y.o.   MRN: [id number]  Chief Complaint  Patient presents with   Rash    Pt states rash going on for  while. Pt states rash started on back and is spreading to sides, stomach, and legs.    HPI Patient is in today for an office visit. She is complaining of a persistent itch and rash on her thighs, back, and arms as well. She has tried taking baths, using lotion, and using a prescribed steroid cream but the rash has not resolved. She denies any knowledge of any bug bites. Her daughter and grandson who she lives with have no symptoms.   She has been monitoring her blood sugar and reports normal levels. She states she hasn't needed insulin in about 2 years.  She reports that she has developed a tremor. The tremor worsens when she tries to move her hands. Her brother was diagnosed with Parkinson's disease in his early 6s and recently passed about 5 months ago.   She has requested information about a possible home health for help with bathing , etc .   Past Medical History:  Diagnosis Date   Allergic rhinitis    Anemia    Anxiety    Barrett esophagus    CAD (coronary artery disease) 2009   a. Multivessel s/p PCI w/DES 2009 // b. s/p CABG 2011  //  c. LHC 8/15: pLAD 95 ISR, LCx 100, pOM1 40, dRCA 100, S-OM1/OM2 ok, S-D1 ok, S-PDA ok, L-LAD ok, EF 60%   Carotid artery disease ([place])    a. Carotid US [id number]: RICA 123456; LICA 123456 >> FU 1 year  //  b. Carotid US 9/17: R 1-39%, L 40-59% >> FU 1 year   Chronic diastolic heart failure (HCC)    CKD (chronic kidney disease), stage II    GFR 60-89 ml/min   Depression    Disc disease, degenerative, cervical    Diverticulosis    Gastroparesis    GERD (gastroesophageal reflux disease)    Gout    H/O  hiatal hernia    Helicobacter pylori gastritis    History of echocardiogram    a. Echo 11/13: EF 55% to 60%. Grade 2 diastolic dysfunction, MAC, trivial MR, mild LAE, normal RVSF, mild RAE, PASP 39 mmHg  //  b. Echo 4/17: EF 55-60%, normal wall motion, trivial AI, MAC, moderate LAE, mild RVE, PASP 35 mmHg   History of thrombocytopenia    HTN (hypertension)    Hyperlipidemia    Hypothyroidism    LBP (low back pain)    Lumbar disc disease/lumbar spinal stenosis   Morbid obesity (HCC)    Myocardial infarction ([place])    Osteoarthritis    Osteopenia    PVD (peripheral vascular disease) ([place])    Sick sinus syndrome ([place])    MDT Dual-chamber PPM implant 02/2012   Type II or unspecified type diabetes mellitus without mention of complication, not stated as uncontrolled     Past Surgical History:  Procedure Laterality Date   ABDOMINAL HYSTERECTOMY     CARDIAC CATHETERIZATION     2011  DR COOPER (APPT NEXT WEEK)   CHOLECYSTECTOMY     CORONARY ARTERY BYPASS GRAFT  2011   LIMA-LAD, SVG-DIAG, SVG-OM1-OM2, SVG-PDA  CORONARY STENT PLACEMENT     Drug-eluting stent to the left anterior descending, circumflex and right coronary artery in Jan 2009   EYE SURGERY     BIL CATARACT REMOVAL 06/2010   LEFT HEART CATHETERIZATION WITH CORONARY ANGIOGRAM N/A 12/02/2013   Procedure: LEFT HEART CATHETERIZATION WITH CORONARY ANGIOGRAM;  Surgeon: Sinclair Grooms, MD;  Location: [facility] CATH LAB;  Service: Cardiovascular;  Laterality: N/A;   OVARIAN CYST REMOVAL     PACEMAKER INSERTION  03/06/12   MDT Adapta L implanted by Dr Rayann Heman for SSS   PERMANENT PACEMAKER INSERTION N/A 03/06/2012   Procedure: PERMANENT PACEMAKER INSERTION;  Surgeon: Thompson Grayer, MD;  Location: [facility] CATH LAB;  Service: Cardiovascular;  Laterality: N/A;   SHOULDER ARTHROSCOPY  06/16/2011   Procedure: ARTHROSCOPY SHOULDER;  Surgeon: Sharmon Revere, MD;  Location: [facility];  Service: Orthopedics;  Laterality: Left;  LEFT SHOULDER ARTHROSCOPY  ACROMIALPLASTY, POSSIBLE MINI OPEN CUFF REPAIR    TUBAL LIGATION      Family History  Problem Relation Age of Onset   Diabetes Mother    Hypertension Mother    Heart attack Mother    Stroke Father    Parkinson's disease Brother    Coronary artery disease Other     Social History   Socioeconomic History   Marital status: Widowed    Spouse name: Not on file   Number of children: Not on file   Years of education: Not on file   Highest education level: Not on file  Occupational History   Occupation: RETIRED LPN  Tobacco Use   Smoking status: Former   Smokeless tobacco: Never   Tobacco comments:    quit 30 yrs ago  Vaping Use   Vaping Use: Never used  Substance and Sexual Activity   Alcohol use: No   Drug use: No   Sexual activity: Not on file  Other Topics Concern   Not on file  Social History Narrative   Widowed 2004.., Lives with daughter and grand son ,Family history is negative for premature coronary artery disease. Mother died at age 57 with heart disease in her later years, father died at age 8 from a stroke.Marland KitchenShe  has 8 siblings, none of whom have coronary artery disease.   Social Determinants of Health   Financial Resource Strain: Low Risk  (11/01/2018)   Overall Financial Resource Strain (CARDIA)    Difficulty of Paying Living Expenses: Not hard at all  Food Insecurity: No Food Insecurity (08/12/2021)   Hunger Vital Sign    Worried About Running Out of Food in the Last Year: Never true    Ran Out of Food in the Last Year: Never true  Transportation Needs: No Transportation Needs (08/12/2021)   PRAPARE - Hydrologist (Medical): No    Lack of Transportation (Non-Medical): No  Physical Activity: Insufficiently Active (02/01/2021)   Exercise Vital Sign    Days of Exercise per Week: 5 days    Minutes of Exercise per Session: 20 min  Stress: No Stress Concern Present (05/04/2021)   [place]    Feeling of Stress : Only a little  Social Connections: Not on file  Intimate Partner Violence: Not on file    Outpatient Medications Prior to Visit  Medication Sig Dispense Refill   acetaminophen (TYLENOL) 500 MG tablet Take 500 mg by mouth every 6 (six) hours as needed for headache (pain).     allopurinol (  ZYLOPRIM) 100 MG tablet Take 100 mg by mouth at bedtime.      amLODipine (NORVASC) 5 MG tablet Take 5 mg by mouth 2 (two) times daily.     atorvastatin (LIPITOR) 20 MG tablet TAKE 1 TABLET BY MOUTH ONCE DAILY 90 tablet 2   azelastine (OPTIVAR) 0.05 % ophthalmic solution Place 1 drop into both eyes 2 (two) times daily.     carvedilol (COREG) 12.5 MG tablet TAKE 1 TABLET BY MOUTH TWICE  DAILY 200 tablet 2   clopidogrel (PLAVIX) 75 MG tablet TAKE 1 TABLET BY MOUTH  DAILY 90 tablet 3   ENTRESTO 97-103 MG TAKE 1 TABLET BY MOUTH TWICE  DAILY 180 tablet 3   furosemide (LASIX) 40 MG tablet Take 1 tablet (40 mg total) by mouth daily. 90 tablet 3   influenza vaccine adjuvanted (FLUAD QUADRIVALENT) 0.5 ML injection Inject into the muscle. 0.5 mL 0   Multiple Vitamin (MULTIVITAMIN WITH MINERALS) TABS tablet Take 1 tablet by mouth at bedtime.      nitroGLYCERIN (NITROSTAT) 0.4 MG SL tablet DISSOLVE ONE TABLET UNDER THE TONGUE EVERY 5 MINUTES AS NEEDED FOR CHEST PAIN.  DO NOT EXCEED A TOTAL OF 3 DOSES IN 15 MINUTES 25 tablet 3   omeprazole (PRILOSEC OTC) 20 MG tablet Take 20 mg by mouth daily.     ONE TOUCH ULTRA TEST test strip 1 each by Other route daily as needed (blood sugar).      pantoprazole (PROTONIX) 40 MG tablet Take 40 mg by mouth daily.     potassium chloride (KLOR-CON) 10 MEQ tablet Take 1 tablet (10 mEq total) by mouth daily. 90 tablet 3   tobramycin (TOBREX) 0.3 % ophthalmic solution Place 1 drop into the left eye 4 (four) times daily.     triamcinolone cream (KENALOG) 0.1 % Apply 1 Application topically 2 (two) times daily. 30 g 0   insulin NPH Human (HUMULIN  N,NOVOLIN N) 100 UNIT/ML injection Inject 5-45 Units into the skin See admin instructions. Inject 45 units subcutaneously every morning and 10 units at night     COVID-19 mRNA vaccine 2023-2024 (COMIRNATY) syringe Inject into the muscle. (Patient not taking: Reported on 05/27/2022) 0.3 mL 0   Facility-Administered Medications Prior to Visit  Medication Dose Route Frequency Provider Last Rate Last Admin   betamethasone acetate-betamethasone sodium phosphate (CELESTONE) injection 3 mg  3 mg Intramuscular Once Edrick Kins, DPM        Allergies  Allergen Reactions   Ciprofloxacin Nausea And Vomiting and Other (See Comments)    syncope   Codeine Other (See Comments)    HALLUCINATIONS   Hydrocodone Other (See Comments)    Makes her pass out   Penicillins Hives    Has patient had a PCN reaction causing immediate rash, facial/tongue/throat swelling, SOB or lightheadedness with hypotension: No Has patient had a PCN reaction causing severe rash involving mucus membranes or skin necrosis: Yes Has patient had a PCN reaction that required hospitalization: in the hospital at time of reaction Has patient had a PCN reaction occurring within the last 10 years: Unknown If all of the above answers are "NO", then may proceed with Cephalosporin use.   Shellfish Allergy Hives   Diltiazem Hcl Other (See Comments)    : low heart rate   Sulfonamide Derivatives Nausea And Vomiting   Morphine And Related Other (See Comments)    Went crazy   Lovastatin Other (See Comments)    Unknown reaction   Metformin Diarrhea  Review of Systems  Constitutional:  Negative for fever and malaise/fatigue.  HENT:  Negative for congestion.   Eyes:  Negative for blurred vision.  Respiratory:  Negative for shortness of breath.   Cardiovascular:  Negative for chest pain, palpitations and leg swelling.  Gastrointestinal:  Negative for abdominal pain, blood in stool and nausea.  Genitourinary:  Negative for dysuria and  frequency.  Musculoskeletal:  Negative for falls.  Skin:  Positive for itching (arms, back, thighs, groin) and rash (arms, back, thighs, groin).  Neurological:  Positive for tremors. Negative for dizziness, loss of consciousness and headaches.  Endo/Heme/Allergies:  Negative for environmental allergies.  Psychiatric/Behavioral:  Negative for depression. The patient is not nervous/anxious.        Objective:    Physical Exam Vitals and nursing note reviewed.  Constitutional:      Appearance: She is well-developed.  HENT:     Head: Normocephalic and atraumatic.  Eyes:     Conjunctiva/sclera: Conjunctivae normal.  Neck:     Thyroid: No thyromegaly.     Vascular: No carotid bruit or JVD.  Cardiovascular:     Rate and Rhythm: Normal rate and regular rhythm.     Heart sounds: Normal heart sounds. No murmur heard. Pulmonary:     Effort: Pulmonary effort is normal. No respiratory distress.     Breath sounds: Normal breath sounds. No wheezing or rales.  Chest:     Chest wall: No tenderness.  Musculoskeletal:     Cervical back: Normal range of motion and neck supple.  Skin:    Findings: Erythema, lesion and rash present. Rash is scaling.     Comments: Hyperpigmented scaly spots on back, arms, anterior thighs, and groin area.   Neurological:     Mental Status: She is alert and oriented to person, place, and time.     BP 110/70 (BP Location: Left Arm, Patient Position: Sitting, Cuff Size: Large)   Pulse (!) 55   Temp 97.8 F (36.6 C) (Oral)   Resp 20   Ht 5' 7"$  (1.702 m)   Wt 199 lb (90.3 kg)   SpO2 95%   BMI 31.17 kg/m  Wt Readings from Last 3 Encounters:  05/27/22 199 lb (90.3 kg)  05/10/22 203 lb (92.1 kg)  04/27/22 210 lb 3.2 oz (95.3 kg)       Assessment & Plan:  Pruritus -     Ambulatory referral to Dermatology -     CBC with Differential/Platelet -     Comprehensive metabolic panel -     Hemoglobin A1c -     predniSONE; TAKE 3 TABLETS PO QD FOR 3 DAYS THEN  TAKE 2 TABLETS PO QD FOR 3 DAYS THEN TAKE 1 TABLET PO QD FOR 3 DAYS THEN TAKE 1/2 TAB PO QD FOR 3 DAYS  Dispense: 20 tablet; Refill: 0 -     Microalbumin / creatinine urine ratio; Future  Rash Assessment & Plan: Depo medrol 40 mg IM Pred taper  Refer to derm   Orders: -     Ambulatory referral to Dermatology -     CBC with Differential/Platelet -     Comprehensive metabolic panel -     Hemoglobin A1c -     predniSONE; TAKE 3 TABLETS PO QD FOR 3 DAYS THEN TAKE 2 TABLETS PO QD FOR 3 DAYS THEN TAKE 1 TABLET PO QD FOR 3 DAYS THEN TAKE 1/2 TAB PO QD FOR 3 DAYS  Dispense: 20 tablet; Refill: 0 -  methylPREDNISolone Acetate -     Microalbumin / creatinine urine ratio; Future  Tremor Assessment & Plan: Benign essential tremor Worsening per pt  Refer to neuro  Orders: -     Ambulatory referral to Neurology -     CBC with Differential/Platelet -     Comprehensive metabolic panel -     Hemoglobin A1c -     Microalbumin / creatinine urine ratio; Future -     AMB Referral to [place] (ACO Patients)  Type 2 diabetes mellitus with diabetic dermatitis, without long-term current use of insulin (HCC) -     CBC with Differential/Platelet -     Comprehensive metabolic panel -     Hemoglobin A1c -     Lipid panel -     Microalbumin / creatinine urine ratio; Future -     AMB Referral to [place] (ACO Patients)  Osteoarthritis of multiple joints, unspecified osteoarthritis type Assessment & Plan: Multiple joints  Refer to Home Health for help with bathing / dressing   Orders: -     AMB Referral to [place] (ACO Patients)  Other orders -     HumuLIN N KwikPen; Per sliding scale -- no more than 40 u a day  Dispense: 15 mL; Refill: 11     I,Rachel Rivera,acting as a scribe for [employer], DO.,have documented all relevant documentation on the behalf of [name], DO,as directed by  [name], DO while  in the presence of [name], DO.   I, [name], DO, personally preformed the services described in this documentation.  All medical record entries made by the scribe were at my direction and in my presence.  I have reviewed the chart and discharge instructions (if applicable) and agree that the record reflects my personal performance and is accurate and complete. 05/27/22   [name], DO

## 2022-05-27 NOTE — Patient Instructions (Signed)
Check blood sugar 4x a day     200-250     2 u  251- 300    4u  301-350     6 u  351-400       8 u >10     10 u

## 2022-05-30 ENCOUNTER — Telehealth: Payer: Self-pay | Admitting: *Deleted

## 2022-05-30 ENCOUNTER — Telehealth: Payer: Self-pay | Admitting: Family Medicine

## 2022-05-30 NOTE — Telephone Encounter (Signed)
I spoke with patient and gave her information from [place], [place].  She has only been taking lasix 20 mg due to frequent urination.  She will follow recommendations from [facility] and try to take 40 mg lasix daily

## 2022-05-30 NOTE — Telephone Encounter (Signed)
Patient states she needs special needles for her [facility]  medication. She states she needs to take her medication today. Please advise.

## 2022-05-30 NOTE — Progress Notes (Signed)
  Care Coordination  Outreach Note  05/30/2022 Name: [name] MRN: [id number] DOB: Jan 11, 1933   Care Coordination Outreach Attempts: An unsuccessful telephone outreach was attempted today to offer the patient information about available care coordination services as a benefit of their health plan.   Referral received   Follow Up Plan:  Additional outreach attempts will be made to offer the patient care coordination information and services.   Encounter Outcome:  No Answer  Julian Hy, [place] Direct Dial: [phone]

## 2022-05-31 ENCOUNTER — Encounter: Payer: Self-pay | Admitting: Neurology

## 2022-05-31 NOTE — Progress Notes (Signed)
Pt has been made aware of normal result and verbalized understanding.  jw

## 2022-06-01 ENCOUNTER — Other Ambulatory Visit: Payer: Self-pay

## 2022-06-01 MED ORDER — EMPAGLIFLOZIN 10 MG PO TABS: 10.0000 mg | ORAL_TABLET | Freq: Every day | ORAL | 3 refills | Status: DC

## 2022-06-01 MED ORDER — BD PEN NEEDLE MICRO U/F 32G X 6 MM MISC: 2 refills | Status: DC

## 2022-06-01 NOTE — Telephone Encounter (Signed)
Pen needles sent to [facility].

## 2022-06-03 NOTE — Progress Notes (Signed)
  Care Coordination  Outreach Note  06/03/2022 Name: [name] MRN: [id number] DOB: 1932-11-27   Care Coordination Outreach Attempts: A second unsuccessful outreach was attempted today to offer the patient with information about available care coordination services as a benefit of their health plan.     Follow Up Plan:  Additional outreach attempts will be made to offer the patient care coordination information and services.   Encounter Outcome:  No Answer  Julian Hy, [place] Direct Dial: [phone]

## 2022-06-08 NOTE — Progress Notes (Signed)
  Care Coordination   Note   06/08/2022 Name: [name] MRN: [id number] DOB: 07-28-1932  [name] is a 89 y.o. year old female who sees Carollee Herter, Alferd Apa, DO for primary care. I reached out to [employer] by phone today to offer care coordination services.  [name] was given information about Care Coordination services today including:   The Care Coordination services include support from the care team which includes your Nurse Coordinator, Clinical Social Worker, or Pharmacist.  The Care Coordination team is here to help remove barriers to the health concerns and goals most important to you. Care Coordination services are voluntary, and the patient may decline or stop services at any time by request to their care team member.   Care Coordination Consent Status: Patient agreed to services and verbal consent obtained.   Follow up plan:  Telephone appointment with care coordination team member scheduled for:  06/10/2022  Encounter Outcome:  Pt. Scheduled from referral   Julian Hy, [place] Direct Dial: [phone]

## 2022-06-10 ENCOUNTER — Ambulatory Visit: Payer: Self-pay | Admitting: Licensed Clinical Social Worker

## 2022-06-10 NOTE — Patient Outreach (Signed)
  Care Coordination  Initial Visit Note   06/10/2022 Name: [name] MRN: [id number] DOB: [birth year]  [name] is a 89 y.o. year old female who sees Carollee Herter, Alferd Apa, DO for primary care. I spoke with  [name] by phone today.  What matters to the patients health and wellness today?  Getting a bath and applying cream to get rid of rash.  Patient has limited use of her left arm and needs assistance with a bath.  Reports she currently has a rash and needs a bath and cream apply to get rid of the rash.  She have no support to assist with meeting this need and is unable to afford to private pay. Insurance provider does not offer custodial care as a benefit.    LCSW waiting for return call from  [place] [facility] [phone] ( provides PCS Services via [facility]) to see if this is an option for patient.   Goals Addressed             This Visit's Progress    Get assistance with bathing       Activities and task to complete in order to accomplish goals.   We will explore the [facility]-Home Aide program at [facility] so that you can be put on the wait list  I will explore other options that may possible assist with your needs, we will discuss next week         SDOH assessments and interventions completed:  Yes  SDOH Interventions Today    Flowsheet Row Most Recent Value  SDOH Interventions   Food Insecurity Interventions Intervention Not Indicated  Transportation Interventions Intervention Not Indicated, SCAT (Specialized Community [employer])  Utilities Interventions Intervention Not Indicated       Care Coordination Interventions:  Yes, provided  Interventions Today    Flowsheet Row Most Recent Value  Chronic Disease   Chronic disease during today's visit Hypertension (HTN), Congestive Heart Failure (CHF), Diabetes, Chronic Kidney Disease/End Stage Renal Disease (ESRD)  General Interventions   General  Interventions Discussed/Reviewed General Interventions Discussed, Level of Care, Communication with  Communication with --  [insurance provider & [facility]  [Discussed all available options]  Education Interventions   Education Provided Provided Education  Provided Verbal Education On Insurance Plans  [Transportation and Custodial Care is not covered per [place]       Follow up plan: Follow up call scheduled for 06/15/22    Encounter Outcome:  Pt. Visit Completed   Casimer Lanius, [place] [phone]

## 2022-06-10 NOTE — Patient Instructions (Signed)
Visit Information  Thank you for taking time to visit with me today. Please don't hesitate to contact me if I can be of assistance to you.   Following are the goals we discussed today:   Goals Addressed             This Visit's Progress    Get assistance with bathing       Activities and task to complete in order to accomplish goals.   We will explore the In-Home Aide program at [facility] so that you can be put on the wait list  I will explore other options that may possible assist with your needs, we will discuss next week         Our next appointment is by telephone on 06/15/22 at 10:30  Please call the care guide team at [phone] if you need to cancel or reschedule your appointment.    The patient verbalized understanding of instructions, educational materials, and care plan provided today and DECLINED offer to receive copy of patient instructions, educational materials, and care plan.   Casimer Lanius, [place] [phone]

## 2022-06-14 ENCOUNTER — Encounter: Payer: Self-pay | Admitting: Licensed Clinical Social Worker

## 2022-06-14 NOTE — Patient Outreach (Signed)
  Care Coordination   Collaboration  Note   06/14/2022 Name: LINSDEY CASEL MRN: [id number] DOB: [birth year]  Milas Gain Daniello is a 89 y.o. year old female who sees Carollee Herter, Alferd Apa, DO for primary care.   Patient was not interviewed or contacted during this encounter LCSW  collaborated with [facility]  to assist with meeting patient's needs.  .   What matters to the patients health and wellness today?  Assistance with ADL's    Goals Addressed             This Visit's Progress    Get assistance with bathing       Activities and task to complete in order to accomplish goals.   We will explore the In-Home Aide program at [facility] so that you can be put on the wait list  I will explore other options that may possible assist with your needs, we will discuss next week    Referral has been faxed to [facility] to provide short term assistance with your ADL's       SDOH assessments and interventions completed:  No  Care Coordination Interventions:  Yes, provided  Interventions Today    Flowsheet Row Most Recent Value  Chronic Disease   Chronic disease during today's visit Hypertension (HTN), Diabetes, Chronic Kidney Disease/End Stage Renal Disease (ESRD)  General Interventions   General Interventions Discussed/Reviewed Communication with  Communication with --  [[facility] management, [facility]  [faxed referral]       Follow up plan: Follow up call scheduled for 06/15/22    Encounter Outcome:  Pt. Visit Completed   Casimer Lanius, [place] [phone]

## 2022-06-14 NOTE — Patient Instructions (Signed)
    Patient was not contacted during this encounter.  LCSW collaborated with care team to accomplish patient's care plan goal   Casimer Lanius, [place] [phone]

## 2022-06-15 ENCOUNTER — Ambulatory Visit: Payer: Self-pay | Admitting: Licensed Clinical Social Worker

## 2022-06-15 ENCOUNTER — Telehealth: Payer: Self-pay | Admitting: Licensed Clinical Social Worker

## 2022-06-15 NOTE — Patient Outreach (Signed)
  Care Coordination  Follow Up Visit Note   06/15/2022 Name: [name] MRN: [id number] DOB: 31-Jan-1933  [name] is a 89 y.o. year old female who sees Carollee Herter, Alferd Apa, DO for primary care. I spoke with  [name] by phone today.  What matters to the patients health and wellness today?  Assistance with ADL's (getting a bath)   Goals Addressed             This Visit's Progress    Get assistance with bathing       Activities and task to complete in order to accomplish goals.   I left a message for the In-Home Aide program at [facility] to call you so that you can be put on the wait list  Keep appointment Monday March 11th for in-home assessment for  [facility] to provide short term assistance with your ADL's I will continue to collaborate with them until the service is set up.       SDOH assessments and interventions completed:  Yes  Care Coordination Interventions:  Yes, provided  Interventions Today    Flowsheet Row Most Recent Value  Chronic Disease   Chronic disease during today's visit Hypertension (HTN), Diabetes, Chronic Kidney Disease/End Stage Renal Disease (ESRD)  General Interventions   General Interventions Discussed/Reviewed Level of Care, General Interventions Reviewed, Communication with  Communication with --  [RN [place] Done with [facility] [phone] and DSS in-home aide program for wait list]  Level of Care Personal Care Services  [facility] will provider short term PCS]  Education Interventions   Education Provided Provided Education       Follow up plan:  will continue to follow up as needed over the next 5 to 7 days.    Encounter Outcome:  Pt. Visit Completed   Casimer Lanius, [place] [phone]

## 2022-06-15 NOTE — Patient Instructions (Signed)
    I am sorry you were unable to keep your phone appointment today.    Latosha Gaylord, LCSW Social Work Care Coordination  [place] [facility] [phone]  

## 2022-06-15 NOTE — Patient Instructions (Signed)
Visit Information  Thank you for taking time to visit with me today. Please don't hesitate to contact me if I can be of assistance to you.   Following are the goals we discussed today:   Goals Addressed             This Visit's Progress    Get assistance with bathing       Activities and task to complete in order to accomplish goals.   I left a message for the In-Home Aide program at [facility] to call you so that you can be put on the wait list  Keep appointment Monday March 11th for in-home assessment for  [facility] to provide short term assistance with your ADL's I will continue to collaborate with them until the service is set up.        Please call the care guide team at [phone] if you need to cancel or reschedule your appointment.    The patient verbalized understanding of instructions, educational materials, and care plan provided today and DECLINED offer to receive copy of patient instructions, educational materials, and care plan.   Casimer Lanius, [place] [phone]

## 2022-06-15 NOTE — Patient Outreach (Signed)
  Care Coordination   06/15/2022 Name: EMY TARANTINO MRN: [id number] DOB: 09-Dec-1932   Care Coordination Outreach Attempts:  An unsuccessful telephone outreach was attempted today to offer the patient information about available care coordination services as a benefit of their health plan.   Follow Up Plan:  Additional outreach attempts will be made to offer the patient care coordination information and services.   Encounter Outcome:  No Answer   Care Coordination Interventions:  No, not indicated    Casimer Lanius, [place] [phone]

## 2022-06-23 ENCOUNTER — Ambulatory Visit: Payer: Self-pay | Admitting: Licensed Clinical Social Worker

## 2022-06-23 NOTE — Patient Instructions (Signed)
Visit Information  Thank you for taking time to visit with me today. Please don't hesitate to contact me if I can be of assistance to you.   Following are the goals we discussed today:   Goals Addressed             This Visit's Progress    Get assistance with bathing       Activities and task to complete in order to accomplish goals.   I will continue to try to contact In-Home Aide program at [facility] to put you on the wait list  Services with HomeCare providers is temp and will be 2 days a week for 17 weeks  ( start date March 13th end day May 8th )        Please call the care guide team at [phone] if you need to cancel or reschedule your appointment.    The patient verbalized understanding of instructions, educational materials, and care plan provided today and DECLINED offer to receive copy of patient instructions, educational materials, and care plan.   Casimer Lanius, [place] [phone]

## 2022-06-23 NOTE — Patient Outreach (Signed)
  Care Coordination  Follow Up Visit Note   06/23/2022 Name: [name] MRN: [id number] DOB: 12-21-1932  [name] is a 89 y.o. year old female who sees [name], Samantha Apa, DO for primary care. I spoke with  [name] by phone today.  What matters to the patients health and wellness today?  Excited that services has started with [facility]    Goals Addressed             This Visit's Progress    Get assistance with bathing       Activities and task to complete in order to accomplish goals.   I will continue to try to contact In-Home Aide program at [facility] to put you on the wait list  Services with HomeCare providers is temp and will be 2 days a week for 17 weeks  ( start date March 13th end day May 8th )       SDOH assessments and interventions completed:  No   Care Coordination Interventions:  Yes, provided  Interventions Today    Flowsheet Row Most Recent Value  Chronic Disease   Chronic disease during today's visit Diabetes, Hypertension (HTN)  General Interventions   General Interventions Discussed/Reviewed General Interventions Reviewed, Level of Care, Communication with  Communication with PCP/Specialists  [RN at [facility] to get services set up/ provided update to PCP]  Level of [place]  [services started 06/22/22  will continue until May 2024]       Follow up plan:  will f/u in 2 weeks  and continue to collaborate with DSS  Encounter Outcome:  Pt. Visit Completed   [name], [place] [phone]

## 2022-06-28 ENCOUNTER — Encounter: Payer: Self-pay | Admitting: Licensed Clinical Social Worker

## 2022-06-28 NOTE — Patient Outreach (Signed)
  Care Coordination  Collaboration  Note   06/28/2022 Name: NEKA ODEGAARD MRN: [id number] DOB: Jun 11, 1932  Milas Gain Fulcher is a 89 y.o. year old female who sees Carollee Herter, Alferd Apa, DO for primary care. I  collaborated with DSS during the encounter Patient was not interviewed or contacted during this encounter.   What matters to the patients health and wellness today?  Getting assistance with a bath.    Goals Addressed             This Visit's Progress    Get assistance with bathing       Activities and task to complete in order to accomplish goals.   I have contacted the [place] program at [facility] you are now on the wait list  Services with HomeCare providers is temp and will be 2 days a week for 17 weeks  ( start date March 13th end day May 8th )       SDOH assessments and interventions completed:  No   Care Coordination Interventions:  Yes, provided  Interventions Today    Flowsheet Row Most Recent Value  Chronic Disease   Chronic disease during today's visit Hypertension (HTN), Diabetes  General Interventions   General Interventions Discussed/Reviewed Level of Care, Communication with  Communication with --  [DSS worker to get pateint on wait list for in-home aide program]  Level of Care Personal Care Services       Follow up plan: Follow up call scheduled for 07/20/22    Encounter Outcome:  Pt. Visit Completed   Casimer Lanius, [place] [phone]

## 2022-06-28 NOTE — Patient Instructions (Signed)
    Patient was not contacted during this encounter.  LCSW collaborated with care team to accomplish patient's care plan goal   Shantika Bermea, LCSW Social Work Care Coordination  [place] [facility] [phone]  

## 2022-06-30 ENCOUNTER — Ambulatory Visit: Payer: Medicare Other | Admitting: Family Medicine

## 2022-07-05 ENCOUNTER — Ambulatory Visit (INDEPENDENT_AMBULATORY_CARE_PROVIDER_SITE_OTHER): Payer: Medicare Other

## 2022-07-05 ENCOUNTER — Ambulatory Visit (INDEPENDENT_AMBULATORY_CARE_PROVIDER_SITE_OTHER): Payer: Medicare Other | Admitting: Family Medicine

## 2022-07-05 ENCOUNTER — Encounter: Payer: Self-pay | Admitting: Family Medicine

## 2022-07-05 VITALS — BP 140/60 | HR 60 | Temp 97.8°F | Resp 18 | Ht 67.0 in | Wt 195.6 lb

## 2022-07-05 DIAGNOSIS — N289 Disorder of kidney and ureter, unspecified: Secondary | ICD-10-CM

## 2022-07-05 DIAGNOSIS — E11620 Type 2 diabetes mellitus with diabetic dermatitis: Secondary | ICD-10-CM | POA: Diagnosis not present

## 2022-07-05 DIAGNOSIS — I495 Sick sinus syndrome: Secondary | ICD-10-CM

## 2022-07-05 DIAGNOSIS — R1013 Epigastric pain: Secondary | ICD-10-CM

## 2022-07-05 DIAGNOSIS — R197 Diarrhea, unspecified: Secondary | ICD-10-CM | POA: Diagnosis not present

## 2022-07-05 LAB — CUP PACEART REMOTE DEVICE CHECK
Battery Impedance: 2518 Ohm
Battery Remaining Longevity: 24 mo
Battery Voltage: 2.73 V
Brady Statistic AP VP Percent: 94 %
Brady Statistic AP VS Percent: 0 %
Brady Statistic AS VP Percent: 6 %
Brady Statistic AS VS Percent: 0 %
Date Time Interrogation Session: 20240326090103
Implantable Lead Connection Status: 753985
Implantable Lead Connection Status: 753985
Implantable Lead Implant Date: 20131126
Implantable Lead Implant Date: 20131126
Implantable Lead Location: 753859
Implantable Lead Location: 753860
Implantable Lead Model: 5076
Implantable Lead Model: 5092
Implantable Pulse Generator Implant Date: 20131126
Lead Channel Impedance Value: 472 Ohm
Lead Channel Impedance Value: 478 Ohm
Lead Channel Pacing Threshold Amplitude: 0.625 V
Lead Channel Pacing Threshold Amplitude: 1.125 V
Lead Channel Pacing Threshold Pulse Width: 0.4 ms
Lead Channel Pacing Threshold Pulse Width: 0.4 ms
Lead Channel Setting Pacing Amplitude: 2 V
Lead Channel Setting Pacing Amplitude: 2.5 V
Lead Channel Setting Pacing Pulse Width: 0.4 ms
Lead Channel Setting Sensing Sensitivity: 2 mV
Zone Setting Status: 755011
Zone Setting Status: 755011

## 2022-07-05 LAB — CBC WITH DIFFERENTIAL/PLATELET
Basophils Absolute: 0.1 10*3/uL (ref 0.0–0.1)
Basophils Relative: 0.6 % (ref 0.0–3.0)
Eosinophils Absolute: 0.1 10*3/uL (ref 0.0–0.7)
Eosinophils Relative: 1.4 % (ref 0.0–5.0)
HCT: 36.3 % (ref 36.0–46.0)
Hemoglobin: 12.2 g/dL (ref 12.0–15.0)
Lymphocytes Relative: 14.9 % (ref 12.0–46.0)
Lymphs Abs: 1.3 10*3/uL (ref 0.7–4.0)
MCHC: 33.6 g/dL (ref 30.0–36.0)
MCV: 95.3 fl (ref 78.0–100.0)
Monocytes Absolute: 0.7 10*3/uL (ref 0.1–1.0)
Monocytes Relative: 8.1 % (ref 3.0–12.0)
Neutro Abs: 6.7 10*3/uL (ref 1.4–7.7)
Neutrophils Relative %: 75 % (ref 43.0–77.0)
Platelets: 226 10*3/uL (ref 150.0–400.0)
RBC: 3.81 Mil/uL — ABNORMAL LOW (ref 3.87–5.11)
RDW: 13.8 % (ref 11.5–15.5)
WBC: 9 10*3/uL (ref 4.0–10.5)

## 2022-07-05 LAB — LIPID PANEL
Cholesterol: 113 mg/dL (ref 0–200)
HDL: 63.9 mg/dL (ref >39.00)
LDL Cholesterol: 26 mg/dL (ref 0–99)
NonHDL: 48.89
Total CHOL/HDL Ratio: 2
Triglycerides: 114 mg/dL (ref 0.0–149.0)
VLDL: 22.8 mg/dL (ref 0.0–40.0)

## 2022-07-05 LAB — COMPREHENSIVE METABOLIC PANEL
ALT: 13 U/L (ref 0–35)
AST: 17 U/L (ref 0–37)
Albumin: 3.7 g/dL (ref 3.5–5.2)
Alkaline Phosphatase: 87 U/L (ref 39–117)
BUN: 28 mg/dL — ABNORMAL HIGH (ref 6–23)
CO2: 28 mEq/L (ref 19–32)
Calcium: 8.9 mg/dL (ref 8.4–10.5)
Chloride: 104 mEq/L (ref 96–112)
Creatinine, Ser: 1.51 mg/dL — ABNORMAL HIGH (ref 0.40–1.20)
GFR: 30.85 mL/min — ABNORMAL LOW (ref >60.00)
Glucose, Bld: 141 mg/dL — ABNORMAL HIGH (ref 70–99)
Potassium: 4.2 mEq/L (ref 3.5–5.1)
Sodium: 140 mEq/L (ref 135–145)
Total Bilirubin: 0.6 mg/dL (ref 0.2–1.2)
Total Protein: 6.2 g/dL (ref 6.0–8.3)

## 2022-07-05 LAB — HEMOGLOBIN A1C: Hgb A1c MFr Bld: 7.7 % — ABNORMAL HIGH (ref 4.6–6.5)

## 2022-07-05 MED ORDER — DICYCLOMINE HCL 10 MG PO CAPS: 10.0000 mg | ORAL_CAPSULE | Freq: Three times a day (TID) | ORAL | 1 refills | Status: DC

## 2022-07-05 MED ORDER — OMEPRAZOLE 20 MG PO CPDR: 20.0000 mg | DELAYED_RELEASE_CAPSULE | Freq: Every day | ORAL | 3 refills | Status: DC

## 2022-07-05 NOTE — Patient Instructions (Signed)
Diarrhea, Adult Diarrhea is frequent loose and sometimes watery bowel movements. Diarrhea can make you feel weak and cause you to become dehydrated. Dehydration is a condition in which there is not enough water or other fluids in the body. Dehydration can make you tired and thirsty, cause you to have a dry mouth, and decrease how often you urinate. Diarrhea typically lasts 2-3 days. However, it can last longer if it is a sign of something more serious. It is important to treat your diarrhea as told by your health care provider. Follow these instructions at home: Eating and drinking     Follow these recommendations as told by your health care provider: Take an oral rehydration solution (ORS). This is an over-the-counter medicine that helps return your body to its normal balance of nutrients and water. It is found at pharmacies and retail stores. Drink enough fluid to keep your urine pale yellow. Drink fluids such as water, diluted fruit juice, and low-calorie sports drinks. You can drink milk also, if desired. Sucking on ice chips is another way to get fluids. Avoid drinking fluids that contain a lot of sugar or caffeine, such as soda, energy drinks, and regular sports drinks. Avoid alcohol. Eat bland, easy-to-digest foods in small amounts as you are able. These foods include bananas, applesauce, rice, lean meats, toast, and crackers. Avoid spicy or fatty foods.  Medicines Take over-the-counter and prescription medicines only as told by your health care provider. If you were prescribed antibiotics, take them as told by your health care provider. Do not stop using the antibiotic even if you start to feel better. General instructions  Wash your hands often using soap and water for at least 20 seconds. If soap and water are not available, use hand sanitizer. Others in the household should wash their hands as well. Hands should be washed: After using the toilet or changing a diaper. Before  preparing, cooking, or serving food. While caring for a sick person or while visiting someone in a hospital. Rest at home while you recover. Take a warm bath to relieve any burning or pain from frequent diarrhea episodes. Watch your condition for any changes. Contact a health care provider if: You have a fever. Your diarrhea gets worse. You have new symptoms. You vomit every time you eat or drink. You feel light-headed, dizzy, or have a headache. You have muscle cramps. You have signs of dehydration, such as: Dark urine, very little urine, or no urine. Cracked lips. Dry mouth. Sunken eyes. Sleepiness. Weakness. You have bloody or black stools or stools that look like tar. You have severe pain, cramping, or bloating in your abdomen. Your skin feels cold and clammy. You feel confused. Get help right away if: You have chest pain or your heart is beating very quickly. You have trouble breathing or you are breathing very quickly. You feel extremely weak or you faint. These symptoms may be an emergency. Get help right away. Call 911. Do not wait to see if the symptoms will go away. Do not drive yourself to the hospital. This information is not intended to replace advice given to you by your health care provider. Make sure you discuss any questions you have with your health care provider. Document Revised: 09/14/2021 Document Reviewed: 09/14/2021 Elsevier Patient Education  2023 [employer] Inc[employer]  

## 2022-07-05 NOTE — Assessment & Plan Note (Signed)
Check stool cx and c diff [place] as needed

## 2022-07-05 NOTE — Progress Notes (Signed)
Established Patient Office Visit  Subjective   Patient ID: [name], female    DOB: 14-Sep-1932  Age: 89 y.o. MRN: [id number]  Chief Complaint  Patient presents with   Diabetes    Pt states she has not started the Jardiance   Follow-up    HPI Pt is here for f/u diabetes,  cholesterol and htn.   She also c/o diarrhea since last office visit.  No fever, no congestion , no abd pain except some cramping just prior to diarrhea episode.    She never started the jardiance because she states she was not aware it was sent in.   Patient Active Problem List   Diagnosis Date Noted   Rash 05/27/2022   Tremor 05/27/2022   Coronary artery disease involving native coronary artery of native heart without angina pectoris 09/14/2021   Pain due to onychomycosis of toenails of both feet 01/15/2021   Type 2 diabetes mellitus with diabetic dermatitis, without long-term current use of insulin ([place]) 01/15/2021   Palpitations 01/15/2018   Cardiac pacemaker in situ 01/15/2018   Heel spur, left 09/13/2017   Pain in left knee 08/23/2017   CKD (chronic kidney disease), stage III ([place]) 12/04/2016   Chest pain 11/23/2015   Type I (juvenile type) diabetes mellitus with renal manifestations, not stated as uncontrolled(250.41) 04/27/2012   Insulin dependent diabetes mellitus with complications XX123456   AKI (acute kidney injury) ([place]) 03/05/2012   Diarrhea 03/05/2012   Carotid artery disease ([place]) 08/11/2010   Left shoulder pain 07/08/2010   Preventative health care 07/08/2010   HOARSENESS 06/04/2010   Pruritus 05/11/2010   ANEMIA-NOS 04/02/2010   (HFpEF) heart failure with preserved ejection fraction ([place]) 02/11/2010   SINUS BRADYCARDIA 10/30/2009   ALLERGIC RHINITIS 09/11/2009   BACK PAIN 09/11/2009   MUSCLE STRAIN, RIGHT BUTTOCK 09/11/2009   SHINGLES 05/11/2009   SHOULDER PAIN, LEFT 12/29/2008   Proteinuria 12/12/2008   Abdominal pain, unspecified site 09/24/2007   Hx of CABG 05/03/2007    CHEST PAIN 04/20/2007   DIZZINESS 02/28/2007   ANXIETY 02/15/2007   GERD 02/15/2007   Gastroparesis 02/15/2007   [place] DISEASE, CERVICAL 02/15/2007   [place] DISEASE, LUMBAR 02/15/2007   SPINAL STENOSIS, LUMBAR 02/15/2007   PERIPHERAL EDEMA 02/15/2007   Personal History of Other Diseases of Digestive Disease 02/15/2007   Hyperlipidemia LDL goal <70 01/01/2007   GOUT 01/01/2007   Morbid obesity ([place]) 01/01/2007   DEPRESSION 01/01/2007   PERIPHERAL VASCULAR DISEASE 01/01/2007   DIVERTICULOSIS, COLON 01/01/2007   Osteoarthritis 01/01/2007   LOW BACK PAIN 01/01/2007   OSTEOPENIA 01/01/2007   Essential hypertension 10/26/2006   Past Medical History:  Diagnosis Date   Allergic rhinitis    Anemia    Anxiety    Barrett esophagus    CAD (coronary artery disease) 2009   a. Multivessel s/p PCI w/DES 2009 // b. s/p CABG 2011  //  c. LHC 8/15: pLAD 95 ISR, LCx 100, pOM1 40, dRCA 100, S-OM1/OM2 ok, S-D1 ok, S-PDA ok, L-LAD ok, EF 60%   Carotid artery disease ([place])    a. Carotid US [id number]: RICA 123456; LICA 123456 >> FU 1 year  //  b. Carotid US 9/17: R 1-39%, L 40-59% >> FU 1 year   Chronic diastolic heart failure (HCC)    CKD (chronic kidney disease), stage II    GFR 60-89 ml/min   Depression    Disc disease, degenerative, cervical    Diverticulosis    Gastroparesis    GERD (gastroesophageal  reflux disease)    Gout    H/O hiatal hernia    Helicobacter pylori gastritis    History of echocardiogram    a. Echo 11/13: EF 55% to 60%. Grade 2 diastolic dysfunction, MAC, trivial MR, mild LAE, normal RVSF, mild RAE, PASP 39 mmHg  //  b. Echo 4/17: EF 55-60%, normal wall motion, trivial AI, MAC, moderate LAE, mild RVE, PASP 35 mmHg   History of thrombocytopenia    HTN (hypertension)    Hyperlipidemia    Hypothyroidism    LBP (low back pain)    Lumbar disc disease/lumbar spinal stenosis   Morbid obesity (HCC)    Myocardial infarction ([place])    Osteoarthritis    Osteopenia    PVD (peripheral  vascular disease) ([address])    Sick sinus syndrome ([place])    MDT Dual-chamber PPM implant 02/2012   Type II or unspecified type diabetes mellitus without mention of complication, not stated as uncontrolled    Past Surgical History:  Procedure Laterality Date   ABDOMINAL HYSTERECTOMY     CARDIAC CATHETERIZATION     2011  DR COOPER (APPT NEXT WEEK)   CHOLECYSTECTOMY     CORONARY ARTERY BYPASS GRAFT  2011   LIMA-LAD, SVG-DIAG, SVG-OM1-OM2, SVG-PDA   CORONARY STENT PLACEMENT     Drug-eluting stent to the left anterior descending, circumflex and right coronary artery in Jan 2009   EYE SURGERY     BIL CATARACT REMOVAL 06/2010   LEFT HEART CATHETERIZATION WITH CORONARY ANGIOGRAM N/A 12/02/2013   Procedure: LEFT HEART CATHETERIZATION WITH CORONARY ANGIOGRAM;  Surgeon: Sinclair Grooms, MD;  Location: [facility] CATH LAB;  Service: Cardiovascular;  Laterality: N/A;   OVARIAN CYST REMOVAL     PACEMAKER INSERTION  03/06/12   MDT Adapta L implanted by Dr Rayann Heman for SSS   PERMANENT PACEMAKER INSERTION N/A 03/06/2012   Procedure: PERMANENT PACEMAKER INSERTION;  Surgeon: Thompson Grayer, MD;  Location: [facility] CATH LAB;  Service: Cardiovascular;  Laterality: N/A;   SHOULDER ARTHROSCOPY  06/16/2011   Procedure: ARTHROSCOPY SHOULDER;  Surgeon: Sharmon Revere, MD;  Location: [place];  Service: Orthopedics;  Laterality: Left;  LEFT SHOULDER ARTHROSCOPY ACROMIALPLASTY, POSSIBLE MINI OPEN CUFF REPAIR    TUBAL LIGATION     Social History   Tobacco Use   Smoking status: Former   Smokeless tobacco: Never   Tobacco comments:    quit 30 yrs ago  Vaping Use   Vaping Use: Never used  Substance Use Topics   Alcohol use: No   Drug use: No   Social History   Socioeconomic History   Marital status: Widowed    Spouse name: Not on file   Number of children: Not on file   Years of education: Not on file   Highest education level: Not on file  Occupational History   Occupation: RETIRED LPN  Tobacco Use   Smoking status:  Former   Smokeless tobacco: Never   Tobacco comments:    quit 30 yrs ago  Vaping Use   Vaping Use: Never used  Substance and Sexual Activity   Alcohol use: No   Drug use: No   Sexual activity: Not on file  Other Topics Concern   Not on file  Social History Narrative   Widowed 2004.., Lives with daughter and grand son ,Family history is negative for premature coronary artery disease. Mother died at age 53 with heart disease in her later years, father died at age 43 from a stroke.Marland KitchenShe  has 8  siblings, none of whom have coronary artery disease.   Social Determinants of Health   Financial Resource Strain: Low Risk  (11/01/2018)   Overall Financial Resource Strain (CARDIA)    Difficulty of Paying Living Expenses: Not hard at all  Food Insecurity: No Food Insecurity (06/10/2022)   Hunger Vital Sign    Worried About Running Out of Food in the Last Year: Never true    Ran Out of Food in the Last Year: Never true  Transportation Needs: No Transportation Needs (06/10/2022)   PRAPARE - Hydrologist (Medical): No    Lack of Transportation (Non-Medical): No  Physical Activity: Insufficiently Active (02/01/2021)   Exercise Vital Sign    Days of Exercise per Week: 5 days    Minutes of Exercise per Session: 20 min  Stress: No Stress Concern Present (05/04/2021)   [place]    Feeling of Stress : Only a little  Social Connections: Not on file  Intimate Partner Violence: Not on file   Family Status  Relation Name Status   Mother  Deceased       Old Age   Father  Deceased   Brother  Deceased   MGM  Deceased   MGF  Deceased   PGM  Deceased   PGF  Deceased   Other  (Not Specified)   Family History  Problem Relation Age of Onset   Diabetes Mother    Hypertension Mother    Heart attack Mother    Stroke Father    Parkinson's disease Brother    Coronary artery disease Other    Allergies   Allergen Reactions   Ciprofloxacin Nausea And Vomiting and Other (See Comments)    syncope   Codeine Other (See Comments)    HALLUCINATIONS   Hydrocodone Other (See Comments)    Makes her pass out   Penicillins Hives    Has patient had a PCN reaction causing immediate rash, facial/tongue/throat swelling, SOB or lightheadedness with hypotension: No Has patient had a PCN reaction causing severe rash involving mucus membranes or skin necrosis: Yes Has patient had a PCN reaction that required hospitalization: in the hospital at time of reaction Has patient had a PCN reaction occurring within the last 10 years: Unknown If all of the above answers are "NO", then may proceed with Cephalosporin use.   Shellfish Allergy Hives   Diltiazem Hcl Other (See Comments)    : low heart rate   Sulfonamide Derivatives Nausea And Vomiting   Morphine And Related Other (See Comments)    Went crazy   Lovastatin Other (See Comments)    Unknown reaction   Metformin Diarrhea      Review of Systems  Constitutional:  Negative for fever and malaise/fatigue.  HENT:  Negative for congestion.   Eyes:  Negative for blurred vision.  Respiratory:  Negative for shortness of breath.   Cardiovascular:  Negative for chest pain, palpitations and leg swelling.  Gastrointestinal:  Negative for abdominal pain, blood in stool and nausea.  Genitourinary:  Negative for dysuria and frequency.  Musculoskeletal:  Negative for falls.  Skin:  Negative for rash.  Neurological:  Negative for dizziness, loss of consciousness and headaches.  Endo/Heme/Allergies:  Negative for environmental allergies.  Psychiatric/Behavioral:  Negative for depression. The patient is not nervous/anxious.       Objective:     BP (!) 140/60 (BP Location: Left Arm, Patient Position: Sitting, Cuff Size: Large)  Pulse 60   Temp 97.8 F (36.6 C) (Oral)   Resp 18   Ht 5\' 7"  (1.702 m)   Wt 195 lb 9.6 oz (88.7 kg)   SpO2 98%   BMI 30.64 kg/m   BP Readings from Last 3 Encounters:  07/05/22 (!) 140/60  05/27/22 110/70  05/10/22 (!) 156/58   Wt Readings from Last 3 Encounters:  07/05/22 195 lb 9.6 oz (88.7 kg)  05/27/22 199 lb (90.3 kg)  05/10/22 203 lb (92.1 kg)   SpO2 Readings from Last 3 Encounters:  07/05/22 98%  05/27/22 95%  05/10/22 (!) 60%      Physical Exam Vitals and nursing note reviewed.  Constitutional:      Appearance: She is well-developed.  HENT:     Head: Normocephalic and atraumatic.     Nose: Nose normal.  Eyes:     Conjunctiva/sclera: Conjunctivae normal.  Neck:     Thyroid: No thyromegaly.     Vascular: No carotid bruit or JVD.  Cardiovascular:     Rate and Rhythm: Normal rate and regular rhythm.     Heart sounds: Normal heart sounds. No murmur heard. Pulmonary:     Effort: Pulmonary effort is normal. No respiratory distress.     Breath sounds: Normal breath sounds. No wheezing or rales.  Chest:     Chest wall: No tenderness.  Abdominal:     General: There is no distension.     Palpations: Abdomen is soft.     Tenderness: There is no abdominal tenderness. There is no guarding or rebound.  Musculoskeletal:     Cervical back: Normal range of motion and neck supple.  Neurological:     Mental Status: She is alert and oriented to person, place, and time.      Results for orders placed or performed in visit on 07/05/22  CUP PACEART REMOTE DEVICE CHECK  Result Value Ref Range   Date Time Interrogation Session [id number]    Pulse Generator Manufacturer MERM    Pulse Gen Model [id number] Adapta    Pulse Gen Serial Number [id number]    Clinic Name [place] Pulse Generator Type Implantable Pulse Generator    Implantable Pulse Generator Implant Date YI:757020    Implantable Lead Manufacturer MERM    Implantable Lead Model 5076 CapSureFix Novus    Implantable Lead Serial Number [id number]    Implantable Lead Implant Date YI:757020    Implantable Lead Location Detail  1 APPENDAGE    Implantable Lead Location [id number]    Implantable Lead Connection Status [id number]    Implantable Lead Manufacturer MERM    Implantable Lead Model [id number] CapSure SP Novus    Implantable Lead Serial Number [id number]    Implantable Lead Implant Date YI:757020    Implantable Lead Location Detail 1 APEX    Implantable Lead Location [id number]    Implantable Lead Connection Status [id number]    Lead Channel Setting Sensing Sensitivity 2.00 mV   Lead Channel Setting Pacing Amplitude 2.000 V   Lead Channel Setting Pacing Pulse Width 0.40 ms   Lead Channel Setting Pacing Amplitude 2.500 V   Zone Setting Status 755011    Zone Setting Status 755011    Lead Channel Impedance Value 472 ohm   Lead Channel Pacing Threshold Amplitude 0.625 V   Lead Channel Pacing Threshold Pulse Width 0.40 ms   Lead Channel Impedance Value 478 ohm   Lead Channel Pacing Threshold Amplitude 1.125 V   Lead Channel Pacing Threshold  Pulse Width 0.40 ms   Battery Status OK    Battery Remaining Longevity 24 mo   Battery Voltage 2.73 V   Battery Impedance 2,518 ohm   Brady Statistic AP VP Percent 94 %   Brady Statistic AS VP Percent 6 %   Brady Statistic AP VS Percent 0 %   Brady Statistic AS VS Percent 0 %    Last CBC Lab Results  Component Value Date   WBC 9.0 05/27/2022   HGB 12.2 05/27/2022   HCT 37.1 05/27/2022   MCV 95.8 05/27/2022   MCH 31.8 07/07/2021   RDW 13.9 05/27/2022   PLT 209.0 [id number]   Last metabolic panel Lab Results  Component Value Date   GLUCOSE 152 (H) 05/27/2022   NA 142 05/27/2022   K 4.3 05/27/2022   CL 106 05/27/2022   CO2 28 05/27/2022   BUN 28 (H) 05/27/2022   CREATININE 1.35 (H) 05/27/2022   EGFR 32 (L) 05/17/2022   CALCIUM 9.1 05/27/2022   PHOS 3.0 03/06/2012   PROT 6.5 05/27/2022   ALBUMIN 3.8 05/27/2022   BILITOT 0.6 05/27/2022   ALKPHOS 102 05/27/2022   AST 16 05/27/2022   ALT 12 05/27/2022   ANIONGAP 7 07/07/2021   Last lipids Lab Results  Component  Value Date   CHOL 118 05/27/2022   HDL 64.00 05/27/2022   LDLCALC 38 05/27/2022   LDLDIRECT 82.5 09/11/2009   TRIG 82.0 05/27/2022   CHOLHDL 2 05/27/2022   Last hemoglobin A1c Lab Results  Component Value Date   HGBA1C 7.1 (H) 05/27/2022   Last thyroid functions Lab Results  Component Value Date   TSH 4.78 03/31/2022   Last vitamin D Lab Results  Component Value Date   VD25OH 41.98 03/31/2022   Last vitamin B12 and Folate Lab Results  Component Value Date   VITAMINB12 318 03/31/2022      The ASCVD Risk score (Arnett DK, et al., 2019) failed to calculate for the following reasons:   The 2019 ASCVD risk score is only valid for ages 37 to 81    Assessment & Plan:   Problem List Items Addressed This Visit       Unprioritized   Type 2 diabetes mellitus with diabetic dermatitis, without long-term current use of insulin ([place]) - Primary   Relevant Orders   Lipid panel   CBC with Differential/Platelet   Comprehensive metabolic panel   Hemoglobin A1c   Diarrhea    Check stool cx and c diff Bentyl as needed       Relevant Medications   dicyclomine (BENTYL) 10 MG capsule   Other Relevant Orders   C. difficile GDH and Toxin A/B   Cdiff NAA+O+P+Stool Culture   Fecal occult blood, imunochemical   Other Visit Diagnoses     Renal insufficiency       Relevant Orders   Comprehensive metabolic panel   Dyspepsia       Relevant Medications   omeprazole (PRILOSEC) 20 MG capsule       Return in about 3 months (around 10/05/2022), or if symptoms worsen or fail to improve, for diabetes II.    [name], DO

## 2022-07-06 ENCOUNTER — Other Ambulatory Visit: Payer: Medicare Other

## 2022-07-06 ENCOUNTER — Ambulatory Visit: Payer: Medicare Other

## 2022-07-06 DIAGNOSIS — R197 Diarrhea, unspecified: Secondary | ICD-10-CM | POA: Diagnosis not present

## 2022-07-06 NOTE — Addendum Note (Signed)
Addended by: Kelle Darting A on: 07/06/2022 02:17 PM   Modules accepted: Orders

## 2022-07-07 ENCOUNTER — Encounter: Payer: Self-pay | Admitting: Family Medicine

## 2022-07-07 MED ORDER — EMPAGLIFLOZIN 10 MG PO TABS: 10.0000 mg | ORAL_TABLET | Freq: Every day | ORAL | 3 refills | Status: DC

## 2022-07-07 NOTE — Addendum Note (Signed)
Addended byDamita Dunnings D on: 07/07/2022 02:58 PM   Modules accepted: Orders

## 2022-07-09 ENCOUNTER — Other Ambulatory Visit: Payer: Self-pay | Admitting: Family Medicine

## 2022-07-09 DIAGNOSIS — A0472 Enterocolitis due to Clostridium difficile, not specified as recurrent: Secondary | ICD-10-CM

## 2022-07-09 MED ORDER — FIDAXOMICIN 200 MG PO TABS: 200.0000 mg | ORAL_TABLET | Freq: Two times a day (BID) | ORAL | 0 refills | Status: DC

## 2022-07-10 LAB — CDIFF NAA+O+P+STOOL CULTURE
E coli, Shiga toxin Assay: NEGATIVE
Toxigenic C. Difficile by PCR: POSITIVE — AB

## 2022-07-12 ENCOUNTER — Other Ambulatory Visit: Payer: Self-pay

## 2022-07-12 DIAGNOSIS — E782 Mixed hyperlipidemia: Secondary | ICD-10-CM

## 2022-07-12 DIAGNOSIS — I2510 Atherosclerotic heart disease of native coronary artery without angina pectoris: Secondary | ICD-10-CM

## 2022-07-12 DIAGNOSIS — N1832 Chronic kidney disease, stage 3b: Secondary | ICD-10-CM

## 2022-07-12 DIAGNOSIS — I5032 Chronic diastolic (congestive) heart failure: Secondary | ICD-10-CM

## 2022-07-12 DIAGNOSIS — I10 Essential (primary) hypertension: Secondary | ICD-10-CM

## 2022-07-12 MED ORDER — POTASSIUM CHLORIDE ER 10 MEQ PO TBCR: 10.0000 meq | EXTENDED_RELEASE_TABLET | Freq: Every day | ORAL | 3 refills | Status: DC

## 2022-07-12 NOTE — Progress Notes (Deleted)
Assessment/Plan:   ***  Subjective:   [name] was seen today in the movement disorders clinic for neurologic consultation at the request of [name], *.  The consultation is for the evaluation of tremor.  Medical records from primary care reviewed.  Patient developed a tremor and was worried because her brother had Parkinson's disease  Tremor: {yes no:314532}   How long has it been going on? ***  At rest or with activation?  ***  When is it noted the most?  ***  Fam hx of tremor?  {yes no:314532} brother had a Parkinson's  Located where?  ***  Affected by caffeine:  {yes no:314532}  Affected by alcohol:  {yes no:314532}  Affected by stress:  {yes no:314532}  Affected by fatigue:  {yes no:314532}  Spills soup if on spoon:  {yes no:314532}  Spills glass of liquid if full:  {yes no:314532}  Affects ADL's (tying shoes, brushing teeth, etc):  {yes no:314532}  Tremor inducing meds:  {yes no:314532}  Other Specific Symptoms:  Voice: *** Sleep: ***  Vivid Dreams:  {yes no:314532}  Acting out dreams:  {yes no:314532} Wet Pillows: {yes no:314532} Postural symptoms:  {yes no:314532}  Falls?  {yes no:314532} Bradykinesia symptoms: {parkinson brady:18041} Loss of smell:  {yes no:314532} Loss of taste:  {yes no:314532} Urinary Incontinence:  {yes no:314532} Difficulty Swallowing:  {yes no:314532} Handwriting, micrographia: {yes no:314532} Trouble with ADL's:  {yes no:314532}  Trouble buttoning clothing: {yes no:314532} Depression:  {yes no:314532} Memory changes:  {yes no:314532} Hallucinations:  {yes no:314532}  visual distortions: {yes no:314532} N/V:  {yes no:314532} Lightheaded:  {yes no:314532}  Syncope: {yes no:314532} Diplopia:  {yes no:314532} Dyskinesia:  {yes no:314532}  Last neuroimaging of the brain was a 2016 head CT, which was nonacute.  PREVIOUS MEDICATIONS: {Parkinson's RX:18200}  ALLERGIES:   Allergies  Allergen Reactions    Ciprofloxacin Nausea And Vomiting and Other (See Comments)    syncope   Codeine Other (See Comments)    HALLUCINATIONS   Hydrocodone Other (See Comments)    Makes her pass out   Penicillins Hives    Has patient had a PCN reaction causing immediate rash, facial/tongue/throat swelling, SOB or lightheadedness with hypotension: No Has patient had a PCN reaction causing severe rash involving mucus membranes or skin necrosis: Yes Has patient had a PCN reaction that required hospitalization: in the hospital at time of reaction Has patient had a PCN reaction occurring within the last 10 years: Unknown If all of the above answers are "NO", then may proceed with Cephalosporin use.   Shellfish Allergy Hives   Diltiazem Hcl Other (See Comments)    : low heart rate   Sulfonamide Derivatives Nausea And Vomiting   Morphine And Related Other (See Comments)    Went crazy   Lovastatin Other (See Comments)    Unknown reaction   Metformin Diarrhea    CURRENT MEDICATIONS:  Current Outpatient Medications  Medication Instructions   acetaminophen (TYLENOL) 500 mg, Oral, Every 6 hours PRN   allopurinol (ZYLOPRIM) 100 mg, Oral, Daily at bedtime   amLODipine (NORVASC) 5 mg, Oral, 2 times daily   atorvastatin (LIPITOR) 20 MG tablet TAKE 1 TABLET BY MOUTH ONCE DAILY   azelastine (OPTIVAR) 0.05 % ophthalmic solution 1 drop, Both Eyes, 2 times daily   carvedilol (COREG) 12.5 mg, Oral, 2 times daily   clopidogrel (PLAVIX) 75 MG tablet TAKE 1 TABLET BY MOUTH  DAILY   dicyclomine (BENTYL) 10 mg, Oral, 3 times daily before  meals & bedtime   empagliflozin (JARDIANCE) 10 mg, Oral, Daily before breakfast   ENTRESTO 97-103 MG TAKE 1 TABLET BY MOUTH TWICE  DAILY   fidaxomicin (DIFICID) 200 mg, Oral, 2 times daily   furosemide (LASIX) 40 mg, Oral, Daily   Insulin NPH, Human,, Isophane, (HUMULIN N KWIKPEN) 100 UNIT/ML Kiwkpen Per sliding scale -- no more than 40 u a day   Insulin Pen Needle (BD PEN NEEDLE MICRO U/F)  32G X 6 MM MISC To inject Humalin   Multiple Vitamin (MULTIVITAMIN WITH MINERALS) TABS tablet 1 tablet, Oral, Daily at bedtime   nitroGLYCERIN (NITROSTAT) 0.4 MG SL tablet DISSOLVE ONE TABLET UNDER THE TONGUE EVERY 5 MINUTES AS NEEDED FOR CHEST PAIN.  DO NOT EXCEED A TOTAL OF 3 DOSES IN 15 MINUTES   omeprazole (PRILOSEC) 20 mg, Oral, Daily   ONE TOUCH ULTRA TEST test strip 1 each, Other, Daily PRN   pantoprazole (PROTONIX) 40 mg, Oral, Daily   potassium chloride (KLOR-CON) 10 MEQ tablet 10 mEq, Oral, Daily   tobramycin (TOBREX) 0.3 % ophthalmic solution 1 drop, Left Eye, 4 times daily   triamcinolone cream (KENALOG) 0.1 % 1 Application, Topical, 2 times daily    Objective:   PHYSICAL EXAMINATION:    VITALS:  There were no vitals filed for this visit.  GEN:  The patient appears stated age and is in NAD. HEENT:  Normocephalic, atraumatic.  The mucous membranes are moist. The superficial temporal arteries are without ropiness or tenderness. CV:  RRR Lungs:  CTAB Neck/HEME:  There are no carotid bruits bilaterally.  Neurological examination:  Orientation: The patient is alert and oriented x3.  Cranial nerves: There is good facial symmetry.  Extraocular muscles are intact. The visual fields are full to confrontational testing. The speech is fluent and clear. Soft palate rises symmetrically and there is no tongue deviation. Hearing is intact to conversational tone. Sensation: Sensation is intact to light touch throughout (facial, trunk, extremities). Vibration is intact at the bilateral big toe. There is no extinction with double simultaneous stimulation.  Motor: Strength is 5/5 in the bilateral upper and lower extremities.   Shoulder shrug is equal and symmetric.  There is no pronator drift. Deep tendon reflexes: Deep tendon reflexes are 2/4 at the bilateral biceps, triceps, brachioradialis, patella and achilles. Plantar responses are downgoing bilaterally.  Movement examination: Tone:  There is ***tone in the bilateral upper extremities.  The tone in the lower extremities is ***.  Abnormal movements: *** Coordination:  There is *** decremation with RAM's, *** Gait and Station: The patient has *** difficulty arising out of a deep-seated chair without the use of the hands. The patient's stride length is ***.  The patient has a *** pull test.     I have reviewed and interpreted the following labs independently   Chemistry      Component Value Date/Time   NA 140 07/05/2022 1111   NA 141 05/17/2022 1355   K 4.2 07/05/2022 1111   CL 104 07/05/2022 1111   CO2 28 07/05/2022 1111   BUN 28 (H) 07/05/2022 1111   BUN 27 05/17/2022 1355   CREATININE 1.51 (H) 07/05/2022 1111   CREATININE 0.99 (H) 05/11/2015 0748      Component Value Date/Time   CALCIUM 8.9 07/05/2022 1111   ALKPHOS 87 07/05/2022 1111   AST 17 07/05/2022 1111   ALT 13 07/05/2022 1111   BILITOT 0.6 07/05/2022 1111      Lab Results  Component Value Date  TSH 4.78 03/31/2022   Lab Results  Component Value Date   WBC 9.0 07/05/2022   HGB 12.2 07/05/2022   HCT 36.3 07/05/2022   MCV 95.3 07/05/2022   PLT 226.0 07/05/2022      Total time spent on today's visit was ***greater than 60 minutes, including both face-to-face time and nonface-to-face time.  Time included that spent on review of records (prior notes available to me/labs/imaging if pertinent), discussing treatment and goals, answering patient's questions and coordinating care.  Cc:  [name], DO

## 2022-07-14 ENCOUNTER — Ambulatory Visit: Payer: Medicare Other | Admitting: Neurology

## 2022-07-20 ENCOUNTER — Ambulatory Visit: Payer: Self-pay | Admitting: Licensed Clinical Social Worker

## 2022-07-20 NOTE — Patient Instructions (Signed)
Visit Information  Thank you for taking time to visit with me today. Please don't hesitate to contact me if I can be of assistance to you.   Following are the goals we discussed today:   Goals Addressed             This Visit's Progress    Get assistance with bathing       Activities and task to complete in order to accomplish goals.   I have contacted the [facility]-Home A[facility] program at [facility] you are now on the wait list  Services with HomeCare providers is temp and will be 2 days a week for 17 weeks  ( start date March 13th end day May 8th ) Keep appointment with Dermatologist April 25th         Our next appointment is by telephone on 08/12/22 at 11:00  Please call the care guide team at [phone] if you need to cancel or reschedule your appointment.    The patient verbalized understanding of instructions, educational materials, and care plan provided today and DECLINED offer to receive copy of patient instructions, educational materials, and care plan.   [name], LCSW Social Work Care Coordination  [name] [employer] [phone]

## 2022-07-20 NOTE — Patient Outreach (Signed)
  Care Coordination  Follow Up Visit Note   07/20/2022 Name: NICOLE HAYMON MRN: [id number] DOB: [birth year]  [name] Omalley is a 89 y.o. year old female who sees [name], Grayling Congress, DO for primary care. I spoke with  [name] by phone today.  What matters to the patients health and wellness today?  Getting her skin cleared up.  Patient continues to enjoy her aide.  She understands this service will end May 8th.  Discussed upcoming appointment for dermatology and assess for transportation barriers.     Goals Addressed             This Visit's Progress    Get assistance with bathing       Activities and task to complete in order to accomplish goals.   I have contacted the In-Home Aide program at [facility] you are now on the wait list  Services with HomeCare providers is temp and will be 2 days a week for 17 weeks  ( start date March 13th end day May 8th ) Keep appointment with Dermatologist April 25th        SDOH assessments and interventions completed:  Yes   Care Coordination Interventions:  Yes, provided  Interventions Today    Flowsheet Row Most Recent Value  Chronic Disease   Chronic disease during today's visit Hypertension (HTN), Diabetes  General Interventions   General Interventions Discussed/Reviewed Level of Care, Doctor Visits  Doctor Visits Discussed/Reviewed Doctor Visits Reviewed  [upcoming appointment and assessed for barriers]  Level of Care Personal Care Services  [facility] continues to assist patient with bathing]       Follow up plan: Follow up call scheduled for 08/12/22    Encounter Outcome:  Pt. Visit Completed   [name], LCSW Social Work Care Coordination  [name] [employer] [phone]

## 2022-07-27 ENCOUNTER — Ambulatory Visit: Payer: Self-pay | Admitting: Licensed Clinical Social Worker

## 2022-07-27 NOTE — Patient Outreach (Signed)
  Care Coordination   Follow Up Visit Note   07/27/2022 Name: KORALEE WEDEKING MRN: [id number] DOB: February 09, 1933  [name] Vanderburg is a 89 y.o. year old female who sees [name], Grayling Congress, DO for primary care. I spoke with  [name] by phone today.  What matters to the patients health and wellness today?  Getting and keeping her aide    Goals Addressed             This Visit's Progress    Get assistance with bathing       Activities and task to complete in order to accomplish goals.   I have contacted the In-Home Aide program at [facility] you are now on the wait list  Services with HomeCare providers is temp and will be 2 days a week for 17 weeks  ( start date March 13th end day May 8th ) We discussed that the HomeCare provider program may end this Friday April 19th  Keep appointment with Dermatologist April 25th  I will continue to call to get you on the wait list at [facility] [phone] in-home aide program        SDOH assessments and interventions completed:  Yes  Care Coordination Interventions:  Yes, provided  Interventions Today    Flowsheet Row Most Recent Value  Chronic Disease   Chronic disease during today's visit Hypertension (HTN), Diabetes  General Interventions   General Interventions Discussed/Reviewed Communication with, Level of Care  Communication with --  [facility] supervisor for [employer],  continued outreach at DSS to add patient to wait list  confirmed from work that patient was not on the list.]  Level of Care Personal Care Services  [facility] provider program]  Education Interventions   Education Provided Provided Education  Provided Verbal Education On Community Resources       Follow up plan:  will f/u in 2 to 5 days    Encounter Outcome:  Pt. Visit Completed   [name], LCSW Social Work Care Coordination  [employer] [name] [employer] [phone]

## 2022-07-27 NOTE — Patient Instructions (Signed)
Visit Information  Thank you for taking time to visit with me today. Please don't hesitate to contact me if I can be of assistance to you.   Following are the goals we discussed today:   Goals Addressed             This Visit's Progress    Get assistance with bathing       Activities and task to complete in order to accomplish goals.   I have contacted the In-Home Aide program at [facility] you are now on the wait list  Services with HomeCare providers is temp and will be 2 days a week for 17 weeks  ( start date March 13th end day May 8th ) We discussed that the HomeCare provider program may end this Friday April 19th  Keep appointment with Dermatologist April 25th  I will continue to call to get you on the wait list at [facility] [phone] in-home aide program         Please call the care guide team at [phone] if you need to cancel or reschedule your appointment.     The patient verbalized understanding of instructions, educational materials, and care plan provided today and DECLINED offer to receive copy of patient instructions, educational materials, and care plan.   Will f/u in 2 to 5 days  [name], LCSW Social Work Care Coordination  [facility] [name] [employer] [phone]

## 2022-07-28 ENCOUNTER — Ambulatory Visit: Payer: Self-pay | Admitting: Licensed Clinical Social Worker

## 2022-07-28 NOTE — Patient Instructions (Signed)
Visit Information  Thank you for taking time to visit with me today. Please don't hesitate to contact me if I can be of assistance to you.   Following are the goals we discussed today:   Goals Addressed             This Visit's Progress    Get assistance with bathing       Activities and task to complete in order to accomplish goals.   I have contacted the In-Home Aide program at [facility] they were unable to find you on the wait list ( I am waiting for a return call from them) Services with HomeCare providers is temp and will end Friday Apil 26th  Keep appointment with Dermatologist April 25th  I will continue to call to get you on the wait list at [facility] [phone] in-home aide program         Our next appointment is by telephone on 08/05/22 at 11:00  Please call the care guide team at [phone] if you need to cancel or reschedule your appointment.    The patient verbalized understanding of instructions, educational materials, and care plan provided today and DECLINED offer to receive copy of patient instructions, educational materials, and care plan.   [name], LCSW Social Work Care Coordination  [name] [employer] [phone]

## 2022-07-28 NOTE — Patient Outreach (Signed)
  Care Coordination  Follow Up Visit Note   07/28/2022 Name: [name] MRN: [id number] DOB: January 28, 1933  [name] is a 89 y.o. year old female who sees [name], Grayling Congress, DO for primary care. I spoke with  [name] by phone today.  What matters to the patients health and wellness today?    Called patient to provide an updated that the last day for her aide would be April 26th  Patient understood.   Goals Addressed             This Visit's Progress    Get assistance with bathing       Activities and task to complete in order to accomplish goals.   I have contacted the In-Home Aide program at [facility] they were unable to find you on the wait list ( I am waiting for a return call from them) Services with HomeCare providers is temp and will end Friday Apil 26th  Keep appointment with Dermatologist April 25th  I will continue to call to get you on the wait list at [facility] [phone] in-home aide program        SDOH assessments and interventions completed:  No   Care Coordination Interventions:  Yes, provided  Interventions Today    Flowsheet Row Most Recent Value  General Interventions   General Interventions Discussed/Reviewed Communication with  [[employer] and out unsuccessful outreach to [facility] ref. in-home aide wait list]       Follow up plan: Follow up call scheduled for 08/05/22    Encounter Outcome:  Pt. Visit Completed   [name], LCSW Social Work Care Coordination  [name] [employer] [phone]

## 2022-08-04 DIAGNOSIS — L300 Nummular dermatitis: Secondary | ICD-10-CM | POA: Diagnosis not present

## 2022-08-05 ENCOUNTER — Ambulatory Visit: Payer: Self-pay | Admitting: Licensed Clinical Social Worker

## 2022-08-05 NOTE — Patient Instructions (Addendum)
Visit Information  Thank you for taking time to visit with me today. Please don't hesitate to contact me if I can be of assistance to you.   Following are the goals we discussed today:   Goals Addressed             This Visit's Progress    Get assistance with bathing       Activities and task to complete in order to accomplish goals.   I have contacted the In-Home Aide program at [facility] you are now on the wait list I have spoken to social worker [name] Services with HomeCare providers is temp anticipate end date is May 8th  I will continue to call to get you on the wait list at [facility] [phone] in-home aide program         Our next appointment is by telephone on 08/18/22 at 11:00  Please call the care guide team at [phone] if you need to cancel or reschedule your appointment.    The patient verbalized understanding of instructions, educational materials, and care plan provided today and DECLINED offer to receive copy of patient instructions, educational materials, and care plan.   [name], LCSW Social Work Care Coordination  [name] [employer] [phone]

## 2022-08-05 NOTE — Patient Outreach (Signed)
  Care Coordination   Follow Up Visit Note   08/05/2022 Name: GLADIS SOLEY MRN: [id number] DOB: Nov 04, 1932  [name] Rumple is a 89 y.o. year old female who sees [name], Grayling Congress, DO for primary care. I spoke with  [name] by phone today.  What matters to the patients health and wellness today?  Having someone to assist with her bath and applying medication from demonology appointment    Goals Addressed             This Visit's Progress    Get assistance with bathing       Activities and task to complete in order to accomplish goals.   I have contacted the In-Home Aide program at [facility] you are now on the wait list I have spoken to social worker [name] Services with HomeCare providers is temp anticipate end date is May 8th  I will continue to call to get you on the wait list at [facility] [phone] in-home aide program        SDOH assessments and interventions completed:  No   Care Coordination Interventions:  Yes, provided  Interventions Today    Flowsheet Row Most Recent Value  Chronic Disease   Chronic disease during today's visit Hypertension (HTN), Congestive Heart Failure (CHF), Diabetes, Chronic Kidney Disease/End Stage Renal Disease (ESRD)  General Interventions   General Interventions Discussed/Reviewed Level of Care, Communication with, General Interventions Reviewed  [solution focusced and problem solving provided]  Doctor Visits Discussed/Reviewed Doctor Visits Reviewed  [per patient Dematologist gave her medication to take a bath in and a 2nd one to rub on her body for 2 to 4 weeks]  Communication with Social Work  [(At DSS)  & [facility] Admin]  Level of Care Personal Care Services       Follow up plan: Follow up call scheduled for 08/18/22    Encounter Outcome:  Pt. Visit Completed   [name], LCSW Social Work Care Coordination  [name] [employer] [phone]

## 2022-08-09 ENCOUNTER — Other Ambulatory Visit: Payer: Self-pay | Admitting: Family Medicine

## 2022-08-09 ENCOUNTER — Ambulatory Visit: Payer: Self-pay | Admitting: Licensed Clinical Social Worker

## 2022-08-09 DIAGNOSIS — A0472 Enterocolitis due to Clostridium difficile, not specified as recurrent: Secondary | ICD-10-CM

## 2022-08-09 DIAGNOSIS — R21 Rash and other nonspecific skin eruption: Secondary | ICD-10-CM

## 2022-08-09 DIAGNOSIS — E11620 Type 2 diabetes mellitus with diabetic dermatitis: Secondary | ICD-10-CM

## 2022-08-09 NOTE — Patient Instructions (Signed)
Visit Information  Thank you for taking time to visit with me today. Please don't hesitate to contact me if I can be of assistance to you.   Following are the goals we discussed today:   Goals Addressed             This Visit's Progress    Get assistance with bathing       Activities and task to complete in order to accomplish goals.   I have contacted the In-Home Aide program [phone] at [facility] you are now on the wait list I have spoken to social worker [name] Services with HomeCare providers is temp after consultation I have been informed April 26th was the last day of service  I have contacted Dr. [name], she has placed an order for PT, OT and aide         Our next appointment is by telephone on 08/18/22   Please call the care guide team at [phone] if you need to cancel or reschedule your appointment.    The patient verbalized understanding of instructions, educational materials, and care plan provided today and DECLINED offer to receive copy of patient instructions, educational materials, and care plan.   [name], LCSW Social Work Care Coordination  [name] [employer] [phone]

## 2022-08-09 NOTE — Patient Outreach (Signed)
  Care Coordination  Follow Up Visit Note   08/09/2022 Name: [name] MRN: [id number] DOB: 1933-03-16  [name] is a 89 y.o. year old female who sees [name], Grayling Congress, DO for primary care. I spoke with  [name] by phone today.  What matters to the patients health and wellness today?  Being able to give herself a bath  Last day for aide was April 26th.  Patient wants help with a bath or being able to bath herself.  She has limitations with mobility and using her left arm.  She may benefit from PT and OT to assist increasing daily activities and an aide to assist until she reaches her goal.    Goals Addressed             This Visit's Progress    Get assistance with bathing       Activities and task to complete in order to accomplish goals.   I have contacted the In-Home Aide program [phone] at [facility] you are now on the wait list I have spoken to social worker [name] Services with HomeCare providers is temp after consultation I have been informed April 26th was the last day of service  I have contacted Dr. [name], she has placed an order for PT, OT and aide        SDOH assessments and interventions completed:  No   Care Coordination Interventions:  Yes, provided  Interventions Today    Flowsheet Row Most Recent Value  Chronic Disease   Chronic disease during today's visit Hypertension (HTN), Congestive Heart Failure (CHF), Diabetes  General Interventions   General Interventions Discussed/Reviewed Level of Care, Communication with  PCP/Specialist Visits Contact provider for referral to  Contacted provider for referral to --  [PT, OT and Aide]  Communication with PCP/Specialists  [& consultation with [facility] Admin.]  Level of Care Personal Care Services       Follow up plan: Follow up call scheduled for 08/18/22    Encounter Outcome:  Pt. Visit Completed   [name], LCSW Social Work Care Coordination  [name] [employer] [phone]

## 2022-08-12 ENCOUNTER — Encounter: Payer: Self-pay | Admitting: Licensed Clinical Social Worker

## 2022-08-15 ENCOUNTER — Encounter (HOSPITAL_BASED_OUTPATIENT_CLINIC_OR_DEPARTMENT_OTHER): Payer: Self-pay

## 2022-08-15 ENCOUNTER — Telehealth: Payer: Self-pay | Admitting: Family Medicine

## 2022-08-15 ENCOUNTER — Other Ambulatory Visit: Payer: Self-pay

## 2022-08-15 ENCOUNTER — Emergency Department (HOSPITAL_BASED_OUTPATIENT_CLINIC_OR_DEPARTMENT_OTHER)
Admission: EM | Admit: 2022-08-15 | Discharge: 2022-08-15 | Disposition: A | Discharge status: Home / Self Care | Payer: Medicare Other | Attending: Emergency Medicine | Admitting: Emergency Medicine

## 2022-08-15 ENCOUNTER — Encounter: Payer: Self-pay | Admitting: Family Medicine

## 2022-08-15 ENCOUNTER — Ambulatory Visit (INDEPENDENT_AMBULATORY_CARE_PROVIDER_SITE_OTHER): Payer: Medicare Other | Admitting: Family Medicine

## 2022-08-15 ENCOUNTER — Emergency Department (HOSPITAL_BASED_OUTPATIENT_CLINIC_OR_DEPARTMENT_OTHER): Payer: Medicare Other

## 2022-08-15 VITALS — BP 145/53 | HR 63 | Ht 67.0 in | Wt 183.0 lb

## 2022-08-15 DIAGNOSIS — E119 Type 2 diabetes mellitus without complications: Secondary | ICD-10-CM | POA: Diagnosis not present

## 2022-08-15 DIAGNOSIS — I2510 Atherosclerotic heart disease of native coronary artery without angina pectoris: Secondary | ICD-10-CM | POA: Diagnosis not present

## 2022-08-15 DIAGNOSIS — Z79899 Other long term (current) drug therapy: Secondary | ICD-10-CM | POA: Diagnosis not present

## 2022-08-15 DIAGNOSIS — J449 Chronic obstructive pulmonary disease, unspecified: Secondary | ICD-10-CM | POA: Diagnosis not present

## 2022-08-15 DIAGNOSIS — I110 Hypertensive heart disease with heart failure: Secondary | ICD-10-CM | POA: Diagnosis not present

## 2022-08-15 DIAGNOSIS — R42 Dizziness and giddiness: Secondary | ICD-10-CM | POA: Insufficient documentation

## 2022-08-15 DIAGNOSIS — I5032 Chronic diastolic (congestive) heart failure: Secondary | ICD-10-CM

## 2022-08-15 DIAGNOSIS — Z794 Long term (current) use of insulin: Secondary | ICD-10-CM | POA: Insufficient documentation

## 2022-08-15 DIAGNOSIS — Z7902 Long term (current) use of antithrombotics/antiplatelets: Secondary | ICD-10-CM | POA: Insufficient documentation

## 2022-08-15 DIAGNOSIS — I509 Heart failure, unspecified: Secondary | ICD-10-CM | POA: Diagnosis not present

## 2022-08-15 LAB — CBC WITH DIFFERENTIAL/PLATELET
Abs Immature Granulocytes: 0.03 10*3/uL (ref 0.00–0.07)
Basophils Absolute: 0 10*3/uL (ref 0.0–0.1)
Basophils Relative: 1 %
Eosinophils Absolute: 0.2 10*3/uL (ref 0.0–0.5)
Eosinophils Relative: 3 %
HCT: 35.6 % — ABNORMAL LOW (ref 36.0–46.0)
Hemoglobin: 11.8 g/dL — ABNORMAL LOW (ref 12.0–15.0)
Immature Granulocytes: 0 %
Lymphocytes Relative: 15 %
Lymphs Abs: 1.3 10*3/uL (ref 0.7–4.0)
MCH: 32.2 pg (ref 26.0–34.0)
MCHC: 33.1 g/dL (ref 30.0–36.0)
MCV: 97 fL (ref 80.0–100.0)
Monocytes Absolute: 0.6 10*3/uL (ref 0.1–1.0)
Monocytes Relative: 7 %
Neutro Abs: 6.3 10*3/uL (ref 1.7–7.7)
Neutrophils Relative %: 74 %
Platelets: 178 10*3/uL (ref 150–400)
RBC: 3.67 MIL/uL — ABNORMAL LOW (ref 3.87–5.11)
RDW: 13.2 % (ref 11.5–15.5)
WBC: 8.4 10*3/uL (ref 4.0–10.5)
nRBC: 0 % (ref 0.0–0.2)

## 2022-08-15 LAB — COMPREHENSIVE METABOLIC PANEL
ALT: 19 U/L (ref 0–44)
AST: 22 U/L (ref 15–41)
Albumin: 3.3 g/dL — ABNORMAL LOW (ref 3.5–5.0)
Alkaline Phosphatase: 93 U/L (ref 38–126)
Anion gap: 8 (ref 5–15)
BUN: 35 mg/dL — ABNORMAL HIGH (ref 8–23)
CO2: 23 mmol/L (ref 22–32)
Calcium: 8.6 mg/dL — ABNORMAL LOW (ref 8.9–10.3)
Chloride: 107 mmol/L (ref 98–111)
Creatinine, Ser: 1.35 mg/dL — ABNORMAL HIGH (ref 0.44–1.00)
GFR, Estimated: 38 mL/min — ABNORMAL LOW (ref >60)
Glucose, Bld: 145 mg/dL — ABNORMAL HIGH (ref 70–99)
Potassium: 3.8 mmol/L (ref 3.5–5.1)
Sodium: 138 mmol/L (ref 135–145)
Total Bilirubin: 0.5 mg/dL (ref 0.3–1.2)
Total Protein: 6.6 g/dL (ref 6.5–8.1)

## 2022-08-15 LAB — CBG MONITORING, ED: Glucose-Capillary: 136 mg/dL — ABNORMAL HIGH (ref 70–99)

## 2022-08-15 MED ORDER — CLOPIDOGREL BISULFATE 75 MG PO TABS
ORAL_TABLET | ORAL | 2 refills | Status: DC
Start: 2022-08-15 — End: 2023-05-08

## 2022-08-15 NOTE — ED Notes (Signed)
Urine sample on sink if needed

## 2022-08-15 NOTE — Progress Notes (Signed)
   Acute Office Visit  Subjective:     Patient ID: [name], female    DOB: November 07, 1932, 89 y.o.   MRN: [id number]  No chief complaint on file.   HPI Patient is in today for dizziness.  Patient states she started feeling very dizzy and weak during the day Saturday (2 days ago). No known triggers. States she has been having spinning sensations, lightheaded weak, general malaise, left upper arm pain with movement, and a slight headache this morning (improved with tylenol). States the dizziness will come on even when sitting still, but with minimal activity she has to stop due to dizziness, lightheadedness, and shortness of breath. States she can barely get from her bed to the bathroom without feeling like she may pass out.   No chest pain, fevers, chills, dyspnea, urinary change, nausea, vomiting, diarrhea, constipation, cough.       ROS All review of systems negative except what is listed in the HPI      Objective:    BP (!) 145/53   Pulse 63   Ht 5\' 7"  (1.702 m)   Wt 183 lb (83 kg)   SpO2 100%   BMI 28.66 kg/m    Physical Exam Vitals reviewed.  Constitutional:      Appearance: Normal appearance. She is obese.  Eyes:     Extraocular Movements: Extraocular movements intact.     Pupils: Pupils are equal, round, and reactive to light.  Cardiovascular:     Rate and Rhythm: Normal rate and regular rhythm.  Pulmonary:     Effort: Pulmonary effort is normal.     Comments: LLL diminished Skin:    General: Skin is warm and dry.  Neurological:     Mental Status: She is alert and oriented to person, place, and time.  Psychiatric:        Mood and Affect: Mood normal.        Behavior: Behavior normal.        Thought Content: Thought content normal.        Judgment: Judgment normal.       No results found for any visits on 08/15/22.      Assessment & Plan:   Problem List Items Addressed This Visit   None Visit Diagnoses     Dizziness    -   Primary Patient with severe dizziness and weakness compared to baseline. Difficulty ambulating to exam room in our office. Mildly orthostatic. Discussed with PCP and advised patient to go to ED. She is agreeable.        No orders of the defined types were placed in this encounter.   No follow-ups on file.  Clayborne Dana, NP

## 2022-08-15 NOTE — Telephone Encounter (Signed)
FYI: This call has been transferred to Access Nurse. Once the result note has been entered staff can address the message at that time.  Patient called in with the following symptoms:  Red Word: Right arm pain / denied falls or other symptoms   Please advise at [facility] [phone]  Message is routed to Provider Pool and [facility] Triage

## 2022-08-15 NOTE — Telephone Encounter (Signed)
Triage nurse sent over a call.  They did not triage her.  Patient stated that she was having right arm pain and dizziness to the point she cannot function.  She told the triage nurse that she needed an appointment.  Patient was not having any chest pain, sob, or nausea/vomiting.  Appointment scheduled with Ladona Ridgel today at 4pm.  Advised patient if she gets worse before appointment to proceed to ER immediately.

## 2022-08-15 NOTE — ED Notes (Addendum)
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

## 2022-08-15 NOTE — ED Provider Notes (Signed)
[facility] Provider Note   CSN: [id number] Arrival date & time: 08/15/22  1644     History  Chief Complaint  Patient presents with   Dizziness   Arm Pain    [name] is a 89 y.o. female.  Patient is an 89 year old female with a past medical history of CAD, CHF, hypertension, COPD, diabetes presenting to the emergency department with dizziness.  The patient states that since Saturday she has been having dizziness with position changes where she feels like she is going to pass out.  She denies any recent medication changes, nausea, vomiting or diarrhea.  She denies any chest pain or shortness of breath.  She denies any fevers or cough.  She denies any black or bloody stool.  She states that she was seen by her PCP today who recommended that she come to the emergency department.  The history is provided by the patient.  Dizziness Arm Pain       Home Medications Prior to Admission medications   Medication Sig Start Date End Date Taking? Authorizing Provider  acetaminophen (TYLENOL) 500 MG tablet Take 500 mg by mouth every 6 (six) hours as needed for headache (pain).    [provider]  allopurinol (ZYLOPRIM) 100 MG tablet Take 100 mg by mouth at bedtime.     [provider]  amLODipine (NORVASC) 5 MG tablet Take 5 mg by mouth 2 (two) times daily.    [provider]  atorvastatin (LIPITOR) 20 MG tablet TAKE 1 TABLET BY MOUTH ONCE DAILY 09/07/21   Tonny Bollman, MD  azelastine (OPTIVAR) 0.05 % ophthalmic solution Place 1 drop into both eyes 2 (two) times daily. 10/26/15   [provider]  carvedilol (COREG) 12.5 MG tablet TAKE 1 TABLET BY MOUTH TWICE  DAILY 03/07/22   Tonny Bollman, MD  clopidogrel (PLAVIX) 75 MG tablet TAKE 1 TABLET BY MOUTH  DAILY 08/15/22   Tereso Newcomer T, PA-C  dicyclomine (BENTYL) 10 MG capsule Take 1 capsule (10 mg total) by mouth 4 (four) times daily -  before meals and at  bedtime. 07/05/22   Seabron Spates R, DO  empagliflozin (JARDIANCE) 10 MG TABS tablet TAKE 1 TABLET BY MOUTH DAILY  BEFORE BREAKFAST 08/09/22   [name], Yvonne R, DO  ENTRESTO 97-103 MG TAKE 1 TABLET BY MOUTH TWICE  DAILY 01/27/22   Tonny Bollman, MD  fidaxomicin (DIFICID) 200 MG TABS tablet Take 1 tablet (200 mg total) by mouth 2 (two) times daily. 07/09/22   Donato Schultz, DO  furosemide (LASIX) 40 MG tablet Take 1 tablet (40 mg total) by mouth daily. 05/10/22   Tereso Newcomer T, PA-C  Insulin NPH, Human,, Isophane, (HUMULIN N KWIKPEN) 100 UNIT/ML Kiwkpen Per sliding scale -- no more than 40 u a day 05/27/22   [name], Grayling Congress, DO  Insulin Pen Needle (BD PEN NEEDLE MICRO U/F) 32G X 6 MM MISC To inject Humalin 06/01/22   [name], Grayling Congress, DO  Multiple Vitamin (MULTIVITAMIN WITH MINERALS) TABS tablet Take 1 tablet by mouth at bedtime.     [provider]  nitroGLYCERIN (NITROSTAT) 0.4 MG SL tablet DISSOLVE ONE TABLET UNDER THE TONGUE EVERY 5 MINUTES AS NEEDED FOR CHEST PAIN.  DO NOT EXCEED A TOTAL OF 3 DOSES IN 15 MINUTES Patient not taking: Reported on 08/15/2022 02/27/20   Marily Lente, NP  omeprazole (PRILOSEC) 20 MG capsule Take 1 capsule (20 mg total)  by mouth daily. 07/05/22   Seabron Spates R, DO  ONE TOUCH ULTRA TEST test strip 1 each by Other route daily as needed (blood sugar).  07/10/13   [provider]  potassium chloride (KLOR-CON) 10 MEQ tablet Take 1 tablet (10 mEq total) by mouth daily. 07/12/22   Tereso Newcomer T, PA-C  tobramycin (TOBREX) 0.3 % ophthalmic solution Place 1 drop into the left eye 4 (four) times daily. 06/13/20   [provider]  triamcinolone cream (KENALOG) 0.1 % Apply 1 Application topically 2 (two) times daily. 03/31/22   Donato Schultz, DO      Allergies    Ciprofloxacin, Codeine, Hydrocodone, Penicillins, Shellfish allergy, Diltiazem hcl, Sulfonamide derivatives, Morphine and related, Lovastatin, and  Metformin    Review of Systems   Review of Systems  Neurological:  Positive for dizziness.    Physical Exam Updated Vital Signs BP (!) 138/45   Pulse (!) 58   Temp 97.9 F (36.6 C)   Resp 18   Ht 5\' 7"  (1.702 m)   Wt 81.6 kg   SpO2 99%   BMI 28.19 kg/m  Physical Exam Vitals and nursing note reviewed.  Constitutional:      General: She is not in acute distress.    Appearance: Normal appearance.  HENT:     Head: Normocephalic and atraumatic.     Nose: Nose normal.     Mouth/Throat:     Mouth: Mucous membranes are moist.     Pharynx: Oropharynx is clear.  Eyes:     Extraocular Movements: Extraocular movements intact.     Conjunctiva/sclera: Conjunctivae normal.     Pupils: Pupils are equal, round, and reactive to light.  Cardiovascular:     Rate and Rhythm: Normal rate and regular rhythm.     Heart sounds: Normal heart sounds.  Pulmonary:     Effort: Pulmonary effort is normal.     Breath sounds: Normal breath sounds.  Abdominal:     General: Abdomen is flat.     Palpations: Abdomen is soft.     Tenderness: There is no abdominal tenderness.  Musculoskeletal:        General: Normal range of motion.     Cervical back: Normal range of motion and neck supple.     Right lower leg: Edema (1+ to ankles) present.     Left lower leg: Edema (1+ to ankles) present.  Skin:    General: Skin is warm and dry.  Neurological:     General: No focal deficit present.     Mental Status: She is alert and oriented to person, place, and time.     Cranial Nerves: No cranial nerve deficit.     Sensory: No sensory deficit.     Motor: No weakness.  Psychiatric:        Mood and Affect: Mood normal.        Behavior: Behavior normal.     ED Results / Procedures / Treatments   Labs (all labs ordered are listed, but only abnormal results are displayed) Labs Reviewed  COMPREHENSIVE METABOLIC PANEL - Abnormal; Notable for the following components:      Result Value   Glucose, Bld 145  (*)    BUN 35 (*)    Creatinine, Ser 1.35 (*)    Calcium 8.6 (*)    Albumin 3.3 (*)    GFR, Estimated 38 (*)    All other components within normal limits  CBC WITH DIFFERENTIAL/PLATELET - Abnormal;  Notable for the following components:   RBC 3.67 (*)    Hemoglobin 11.8 (*)    HCT 35.6 (*)    All other components within normal limits  CBG MONITORING, ED - Abnormal; Notable for the following components:   Glucose-Capillary 136 (*)    All other components within normal limits    EKG EKG Interpretation  Date/Time:  Monday Aug 15 2022 17:01:04 EDT Ventricular Rate:  66 PR Interval:  74 QRS Duration: 195 QT Interval:  485 QTC Calculation: 489 R Axis:   -69 Text Interpretation: VENTRICULAR PACED RHYTHM Short PR interval Left bundle branch block No significant change since last tracing Confirmed by Elayne Snare ([id number]) on 08/15/2022 5:12:16 PM  Radiology DG Chest 2 View  Result Date: 08/15/2022 CLINICAL DATA:  Dizziness EXAM: CHEST - 2 VIEW COMPARISON:  X-ray 07/07/2021 FINDINGS: Sternal wires. Underinflation. Left upper chest dual lead pacemaker. No consolidation, pneumothorax or effusion. Interstitial prominence is seen but could be chronic and is unchanged from previous. Overlapping cardiac leads. Degenerative changes seen of the shoulders. IMPRESSION: Postop chest with pacemaker. Stable interstitial prominence which could be chronic. Underinflation Electronically Signed   By: Karen Kays M.D.   On: 08/15/2022 17:39    Procedures Procedures    Medications Ordered in ED Medications - No data to display  ED Course/ Medical Decision Making/ A&P Clinical Course as of 08/15/22 2133  Mon Aug 15, 2022  1910 Labs within normal range. She was able to ambulate to the bathroom and mild dizziness when she returned. Orthostatics negative. Pacemaker interrogation report pending. [VK]  2101 I spoke with [employer], 22 months left on battery. Dual chamber ICD. Two episodes of nonsustained in  March and February, appears to capturing appropriately. 100% ventricularly paced. [VK]    Clinical Course User Index [VK] Rexford Maus, DO                             Medical Decision Making This patient presents to the ED with chief complaint(s) of dizziness with pertinent past medical history of CAD, CHF, hypertension, diabetes, COPD which further complicates the presenting complaint. The complaint involves an extensive differential diagnosis and also carries with it a high risk of complications and morbidity.    The differential diagnosis includes arrhythmia, anemia, electrolyte abnormality, dehydration, orthostatic hypotension  Additional history obtained: Additional history obtained from family Records reviewed Primary Care Documents  ED Course and Reassessment: On patient's arrival to the emergency department she is hemodynamically stable in no acute distress.  The patient will have labs performed to evaluate for anemia, dehydration or electrolyte abnormality as cause of her symptoms and will have her pacemaker interrogated.  EKG on arrival did show a ventricularly paced rhythm without acute abnormalities from prior EKG.  Show orthostatic vitals performed and she will be closely reassessed.  Independent labs interpretation:  The following labs were independently interpreted: at baseline without acute abnormality  Independent visualization of imaging: - I independently visualized the following imaging with scope of interpretation limited to determining acute life threatening conditions related to emergency care: CXR, which revealed no acute disease  Consultation: - Consulted or discussed management/test interpretation w/ external professional: N/A  Consideration for admission or further workup: Patient has no emergent conditions requiring admission or further work-up at this time and is stable for discharge home with primary care follow-up  Social Determinants of health:  N/A    Amount and/or Complexity of Data Reviewed  Labs: ordered. Radiology: ordered.          Final Clinical Impression(s) / ED Diagnoses Final diagnoses:  Dizziness    Rx / DC Orders ED Discharge Orders     None         Rexford Maus, DO 08/15/22 2133

## 2022-08-15 NOTE — ED Notes (Signed)
Pt off the floor in xray.

## 2022-08-15 NOTE — ED Triage Notes (Signed)
Pt brought down from PCP via W/C and staff. Grandson accompanied. Pt reports dizziness since Saturday . Dizziness occurs when standing. Also reports left shoulder pain couple weeks. No injury

## 2022-08-15 NOTE — ED Notes (Signed)
Pt requesting RN call her grandson, TJ @ [phone]; spoke with him, provided update and he is on his way back to the ED.

## 2022-08-15 NOTE — Discharge Instructions (Signed)
You were seen in the emergency department for your dizziness.  Your heart was in a normal rhythm and your pacemaker seems to be working appropriately with no recent abnormal heart rhythms.  Your labs were normal without any obvious cause of your dizziness.  You can follow-up with your primary doctor or your cardiologist to have your symptoms rechecked and see if you need any further workup or medication changes.  You should return to the emergency department if you pass out, you have severe chest pain or shortness of breath with your dizziness or if you have any other new or concerning symptoms.

## 2022-08-15 NOTE — ED Notes (Signed)
Spoke with [employer]; updated provider on pacemaker report. Report will be faxed to [phone]

## 2022-08-16 NOTE — Progress Notes (Signed)
Remote pacemaker transmission.   

## 2022-08-18 ENCOUNTER — Ambulatory Visit: Payer: Self-pay | Admitting: Licensed Clinical Social Worker

## 2022-08-18 NOTE — Patient Outreach (Signed)
  Care Coordination  Follow Up Visit Note   08/18/2022 Name: [name] MRN: [id number] DOB: [birth year]  [name] is a 89 y.o. year old female who sees [name], Grayling Congress, DO for primary care. I spoke with  [name] by phone today.  What matters to the patients health and wellness today?  Being able to manage her health with current barriers.    Goals Addressed             This Visit's Progress    Get Home Health       Activities and task to complete in order to accomplish goals.   I have contacted the In-Home Aide program [phone] at [employer] you are now on the wait list I have spoken to social worker [name] I am glad the [facility] agency has contacted you Dr. [name], placed an order for PT, OT and aide.         SDOH assessments and interventions completed:  No   Care Coordination Interventions:  Yes, provided  Interventions Today    Flowsheet Row Most Recent Value  Chronic Disease   Chronic disease during today's visit Hypertension (HTN), Diabetes, Congestive Heart Failure (CHF), Chronic Kidney Disease/End Stage Renal Disease (ESRD)  General Interventions   General Interventions Discussed/Reviewed Level of Care, General Interventions Reviewed  Communication with PCP/Specialists  [name]. f/u after ED visit]  Level of Care --  [discussed]  Safety Interventions   Safety Discussed/Reviewed Home Safety  Home Safety Contact provider for referral to PT/OT       Follow up plan: Follow up call scheduled for 2 weeks    Encounter Outcome:  Pt. Visit Completed   Samantha Hines, LCSW Social Work Care Coordination  [name] [employer] [phone]

## 2022-08-18 NOTE — Patient Instructions (Signed)
Visit Information  Thank you for taking time to visit with me today. Please don't hesitate to contact me if I can be of assistance to you.   Following are the goals we discussed today:   Goals Addressed             This Visit's Progress    Get Home Health       Activities and task to complete in order to accomplish goals.   I have contacted the In-Home Aide program [phone] at [employer] you are now on the wait list I have spoken to social worker [name] I am glad the [facility] agency has contacted you Dr. [name], placed an order for PT, OT and aide.         Our next appointment is by telephone on 09/01/22 at 1:15  Please call the care guide team at [phone] if you need to cancel or reschedule your appointment.    The patient verbalized understanding of instructions, educational materials, and care plan provided today and DECLINED offer to receive copy of patient instructions, educational materials, and care plan.   [name], LCSW Social Work Care Coordination  [name] [employer] [phone]

## 2022-08-19 ENCOUNTER — Ambulatory Visit: Payer: Medicare Other | Admitting: Family Medicine

## 2022-08-20 DIAGNOSIS — M48061 Spinal stenosis, lumbar region without neurogenic claudication: Secondary | ICD-10-CM | POA: Diagnosis not present

## 2022-08-20 DIAGNOSIS — M8580 Other specified disorders of bone density and structure, unspecified site: Secondary | ICD-10-CM | POA: Diagnosis not present

## 2022-08-20 DIAGNOSIS — E11620 Type 2 diabetes mellitus with diabetic dermatitis: Secondary | ICD-10-CM | POA: Diagnosis not present

## 2022-08-20 DIAGNOSIS — N179 Acute kidney failure, unspecified: Secondary | ICD-10-CM | POA: Diagnosis not present

## 2022-08-20 DIAGNOSIS — I2510 Atherosclerotic heart disease of native coronary artery without angina pectoris: Secondary | ICD-10-CM | POA: Diagnosis not present

## 2022-08-20 DIAGNOSIS — N183 Chronic kidney disease, stage 3 unspecified: Secondary | ICD-10-CM | POA: Diagnosis not present

## 2022-08-20 DIAGNOSIS — K219 Gastro-esophageal reflux disease without esophagitis: Secondary | ICD-10-CM | POA: Diagnosis not present

## 2022-08-20 DIAGNOSIS — E1143 Type 2 diabetes mellitus with diabetic autonomic (poly)neuropathy: Secondary | ICD-10-CM | POA: Diagnosis not present

## 2022-08-20 DIAGNOSIS — M1030 Gout due to renal impairment, unspecified site: Secondary | ICD-10-CM | POA: Diagnosis not present

## 2022-08-20 DIAGNOSIS — K2270 Barrett's esophagus without dysplasia: Secondary | ICD-10-CM | POA: Diagnosis not present

## 2022-08-20 DIAGNOSIS — E785 Hyperlipidemia, unspecified: Secondary | ICD-10-CM | POA: Diagnosis not present

## 2022-08-20 DIAGNOSIS — I130 Hypertensive heart and chronic kidney disease with heart failure and stage 1 through stage 4 chronic kidney disease, or unspecified chronic kidney disease: Secondary | ICD-10-CM | POA: Diagnosis not present

## 2022-08-20 DIAGNOSIS — I5032 Chronic diastolic (congestive) heart failure: Secondary | ICD-10-CM | POA: Diagnosis not present

## 2022-08-20 DIAGNOSIS — K5730 Diverticulosis of large intestine without perforation or abscess without bleeding: Secondary | ICD-10-CM | POA: Diagnosis not present

## 2022-08-20 DIAGNOSIS — E1151 Type 2 diabetes mellitus with diabetic peripheral angiopathy without gangrene: Secondary | ICD-10-CM | POA: Diagnosis not present

## 2022-08-20 DIAGNOSIS — K3184 Gastroparesis: Secondary | ICD-10-CM | POA: Diagnosis not present

## 2022-08-20 DIAGNOSIS — E1122 Type 2 diabetes mellitus with diabetic chronic kidney disease: Secondary | ICD-10-CM | POA: Diagnosis not present

## 2022-08-20 DIAGNOSIS — M25562 Pain in left knee: Secondary | ICD-10-CM | POA: Diagnosis not present

## 2022-08-20 DIAGNOSIS — M5136 Other intervertebral disc degeneration, lumbar region: Secondary | ICD-10-CM | POA: Diagnosis not present

## 2022-08-20 DIAGNOSIS — D631 Anemia in chronic kidney disease: Secondary | ICD-10-CM | POA: Diagnosis not present

## 2022-08-20 DIAGNOSIS — E039 Hypothyroidism, unspecified: Secondary | ICD-10-CM | POA: Diagnosis not present

## 2022-08-20 DIAGNOSIS — I495 Sick sinus syndrome: Secondary | ICD-10-CM | POA: Diagnosis not present

## 2022-08-20 DIAGNOSIS — F32A Depression, unspecified: Secondary | ICD-10-CM | POA: Diagnosis not present

## 2022-08-22 ENCOUNTER — Encounter: Payer: Self-pay | Admitting: Family Medicine

## 2022-08-22 ENCOUNTER — Ambulatory Visit (INDEPENDENT_AMBULATORY_CARE_PROVIDER_SITE_OTHER): Payer: Medicare Other | Admitting: Family Medicine

## 2022-08-22 VITALS — BP 118/78 | HR 69 | Temp 98.0°F | Resp 20 | Ht 67.0 in | Wt 183.0 lb

## 2022-08-22 DIAGNOSIS — R251 Tremor, unspecified: Secondary | ICD-10-CM | POA: Diagnosis not present

## 2022-08-22 DIAGNOSIS — R42 Dizziness and giddiness: Secondary | ICD-10-CM | POA: Diagnosis not present

## 2022-08-22 DIAGNOSIS — Z23 Encounter for immunization: Secondary | ICD-10-CM | POA: Diagnosis not present

## 2022-08-22 NOTE — Assessment & Plan Note (Signed)
Pt has app with neuro later this week

## 2022-08-22 NOTE — Progress Notes (Signed)
Established Patient Office Visit  Subjective   Patient ID: [name], female    DOB: 24-Dec-1932  Age: 89 y.o. MRN: [id number]  Chief Complaint  Patient presents with   ER follow up    Dizziness, Pt states feeling a lot better and has stopped Jardiance. Pt believes medication was causing the dizziness.      HPI Pt is here to f/u er for dizziness.  Pt states it came from her jardiance-- she stopped it and her dizziness went away.  No other complaints  Patient Active Problem List   Diagnosis Date Noted   Need for pneumococcal 20-valent conjugate vaccination 08/22/2022   Rash 05/27/2022   Tremor 05/27/2022   Coronary artery disease involving native coronary artery of native heart without angina pectoris 09/14/2021   Pain due to onychomycosis of toenails of both feet 01/15/2021   Type 2 diabetes mellitus with diabetic dermatitis, without long-term current use of insulin (HCC) 01/15/2021   Palpitations 01/15/2018   Cardiac pacemaker in situ 01/15/2018   Heel spur, left 09/13/2017   Pain in left knee 08/23/2017   CKD (chronic kidney disease), stage III (HCC) 12/04/2016   Chest pain 11/23/2015   Type I (juvenile type) diabetes mellitus with renal manifestations, not stated as uncontrolled(250.41) 04/27/2012   Insulin dependent diabetes mellitus with complications 04/27/2012   AKI (acute kidney injury) (HCC) 03/05/2012   Diarrhea 03/05/2012   Carotid artery disease (HCC) 08/11/2010   Left shoulder pain 07/08/2010   Preventative health care 07/08/2010   HOARSENESS 06/04/2010   Pruritus 05/11/2010   ANEMIA-NOS 04/02/2010   (HFpEF) heart failure with preserved ejection fraction (HCC) 02/11/2010   SINUS BRADYCARDIA 10/30/2009   ALLERGIC RHINITIS 09/11/2009   BACK PAIN 09/11/2009   MUSCLE STRAIN, RIGHT BUTTOCK 09/11/2009   SHINGLES 05/11/2009   SHOULDER PAIN, LEFT 12/29/2008   Proteinuria 12/12/2008   Abdominal pain, unspecified site 09/24/2007   Hx of CABG 05/03/2007    CHEST PAIN 04/20/2007   Dizziness 02/28/2007   ANXIETY 02/15/2007   GERD 02/15/2007   Gastroparesis 02/15/2007   DISC DISEASE, CERVICAL 02/15/2007   DISC DISEASE, LUMBAR 02/15/2007   SPINAL STENOSIS, LUMBAR 02/15/2007   PERIPHERAL EDEMA 02/15/2007   Personal History of Other Diseases of Digestive Disease 02/15/2007   Hyperlipidemia LDL goal <70 01/01/2007   GOUT 01/01/2007   Morbid obesity (HCC) 01/01/2007   DEPRESSION 01/01/2007   PERIPHERAL VASCULAR DISEASE 01/01/2007   DIVERTICULOSIS, COLON 01/01/2007   Osteoarthritis 01/01/2007   LOW BACK PAIN 01/01/2007   OSTEOPENIA 01/01/2007   Essential hypertension 10/26/2006   Past Medical History:  Diagnosis Date   Allergic rhinitis    Anemia    Anxiety    Barrett esophagus    CAD (coronary artery disease) 2009   a. Multivessel s/p PCI w/DES 2009 // b. s/p CABG 2011  //  c. LHC 8/15: pLAD 95 ISR, LCx 100, pOM1 40, dRCA 100, S-OM1/OM2 ok, S-D1 ok, S-PDA ok, L-LAD ok, EF 60%   Carotid artery disease (HCC)    a. Carotid US 9/15: RICA 1-39%; LICA 40-59% >> FU 1 year  //  b. Carotid US 9/17: R 1-39%, L 40-59% >> FU 1 year   Chronic diastolic heart failure (HCC)    CKD (chronic kidney disease), stage II    GFR 60-89 ml/min   Depression    Disc disease, degenerative, cervical    Diverticulosis    Gastroparesis    GERD (gastroesophageal reflux disease)    Gout  H/O hiatal hernia    Helicobacter pylori gastritis    History of echocardiogram    a. Echo 11/13: EF 55% to 60%. Grade 2 diastolic dysfunction, MAC, trivial MR, mild LAE, normal RVSF, mild RAE, PASP 39 mmHg  //  b. Echo 4/17: EF 55-60%, normal wall motion, trivial AI, MAC, moderate LAE, mild RVE, PASP 35 mmHg   History of thrombocytopenia    HTN (hypertension)    Hyperlipidemia    Hypothyroidism    LBP (low back pain)    Lumbar disc disease/lumbar spinal stenosis   Morbid obesity (HCC)    Myocardial infarction (HCC)    Osteoarthritis    Osteopenia    PVD (peripheral  vascular disease) (HCC)    Sick sinus syndrome (HCC)    MDT Dual-chamber PPM implant 02/2012   Type II or unspecified type diabetes mellitus without mention of complication, not stated as uncontrolled    Past Surgical History:  Procedure Laterality Date   ABDOMINAL HYSTERECTOMY     CARDIAC CATHETERIZATION     2011  DR COOPER (APPT NEXT WEEK)   CHOLECYSTECTOMY     CORONARY ARTERY BYPASS GRAFT  2011   LIMA-LAD, SVG-DIAG, SVG-OM1-OM2, SVG-PDA   CORONARY STENT PLACEMENT     Drug-eluting stent to the left anterior descending, circumflex and right coronary artery in Jan 2009   EYE SURGERY     BIL CATARACT REMOVAL 06/2010   LEFT HEART CATHETERIZATION WITH CORONARY ANGIOGRAM N/A 12/02/2013   Procedure: LEFT HEART CATHETERIZATION WITH CORONARY ANGIOGRAM;  Surgeon: Lesleigh Noe, MD;  Location: [facility] CATH LAB;  Service: Cardiovascular;  Laterality: N/A;   OVARIAN CYST REMOVAL     PACEMAKER INSERTION  03/06/12   MDT Adapta L implanted by Dr Johney Frame for SSS   PERMANENT PACEMAKER INSERTION N/A 03/06/2012   Procedure: PERMANENT PACEMAKER INSERTION;  Surgeon: Hillis Range, MD;  Location: [facility] CATH LAB;  Service: Cardiovascular;  Laterality: N/A;   SHOULDER ARTHROSCOPY  06/16/2011   Procedure: ARTHROSCOPY SHOULDER;  Surgeon: Kennieth Rad, MD;  Location: [facility] OR;  Service: Orthopedics;  Laterality: Left;  LEFT SHOULDER ARTHROSCOPY ACROMIALPLASTY, POSSIBLE MINI OPEN CUFF REPAIR    TUBAL LIGATION     Social History   Tobacco Use   Smoking status: Former   Smokeless tobacco: Never   Tobacco comments:    quit 30 yrs ago  Vaping Use   Vaping Use: Never used  Substance Use Topics   Alcohol use: No   Drug use: No   Social History   Socioeconomic History   Marital status: Widowed    Spouse name: Not on file   Number of children: Not on file   Years of education: Not on file   Highest education level: Not on file  Occupational History   Occupation: RETIRED LPN  Tobacco Use   Smoking status:  Former   Smokeless tobacco: Never   Tobacco comments:    quit 30 yrs ago  Vaping Use   Vaping Use: Never used  Substance and Sexual Activity   Alcohol use: No   Drug use: No   Sexual activity: Not on file  Other Topics Concern   Not on file  Social History Narrative   Widowed 2004.., Lives with daughter and grand son ,Family history is negative for premature coronary artery disease. Mother died at age 29 with heart disease in her later years, father died at age 30 from a stroke.Marland KitchenShe  has 8 siblings, none of whom have coronary artery disease.  Social Determinants of Health   Financial Resource Strain: Low Risk  (11/01/2018)   Overall Financial Resource Strain (CARDIA)    Difficulty of Paying Living Expenses: Not hard at all  Food Insecurity: No Food Insecurity (06/10/2022)   Hunger Vital Sign    Worried About Running Out of Food in the Last Year: Never true    Ran Out of Food in the Last Year: Never true  Transportation Needs: No Transportation Needs (06/10/2022)   PRAPARE - Administrator, Civil Service (Medical): No    Lack of Transportation (Non-Medical): No  Physical Activity: Insufficiently Active (02/01/2021)   Exercise Vital Sign    Days of Exercise per Week: 5 days    Minutes of Exercise per Session: 20 min  Stress: No Stress Concern Present (05/04/2021)   [employer] of Occupational Health - Occupational Stress Questionnaire    Feeling of Stress : Only a little  Social Connections: Not on file  Intimate Partner Violence: Not on file   Family Status  Relation Name Status   Mother  Deceased       Old Age   Father  Deceased   Brother  Deceased   MGM  Deceased   MGF  Deceased   PGM  Deceased   PGF  Deceased   Other  (Not Specified)   Family History  Problem Relation Age of Onset   Diabetes Mother    Hypertension Mother    Heart attack Mother    Stroke Father    Parkinson's disease Brother    Coronary artery disease Other    Allergies   Allergen Reactions   Ciprofloxacin Nausea And Vomiting and Other (See Comments)    syncope   Codeine Other (See Comments)    HALLUCINATIONS   Hydrocodone Other (See Comments)    Makes her pass out   Penicillins Hives    Has patient had a PCN reaction causing immediate rash, facial/tongue/throat swelling, SOB or lightheadedness with hypotension: No Has patient had a PCN reaction causing severe rash involving mucus membranes or skin necrosis: Yes Has patient had a PCN reaction that required hospitalization: in the hospital at time of reaction Has patient had a PCN reaction occurring within the last 10 years: Unknown If all of the above answers are "NO", then may proceed with Cephalosporin use.   Shellfish Allergy Hives   Diltiazem Hcl Other (See Comments)    : low heart rate   Sulfonamide Derivatives Nausea And Vomiting   Morphine And Related Other (See Comments)    Went crazy   Lovastatin Other (See Comments)    Unknown reaction   Metformin Diarrhea      Review of Systems  Constitutional:  Negative for fever and malaise/fatigue.  HENT:  Negative for congestion.   Eyes:  Negative for blurred vision.  Respiratory:  Negative for shortness of breath.   Cardiovascular:  Negative for chest pain, palpitations and leg swelling.  Gastrointestinal:  Negative for abdominal pain, blood in stool and nausea.  Genitourinary:  Negative for dysuria and frequency.  Musculoskeletal:  Negative for falls.  Skin:  Negative for rash.  Neurological:  Negative for dizziness, loss of consciousness and headaches.  Endo/Heme/Allergies:  Negative for environmental allergies.  Psychiatric/Behavioral:  Negative for depression. The patient is not nervous/anxious.       Objective:     BP 118/78 (BP Location: Left Arm, Patient Position: Sitting, Cuff Size: Large)   Pulse 69   Temp 98 F (36.7 C) (Oral)  Resp 20   Ht 5\' 7"  (1.702 m)   Wt 183 lb (83 kg)   SpO2 98%   BMI 28.66 kg/m  BP Readings  from Last 3 Encounters:  08/22/22 118/78  08/15/22 (!) 146/46  08/15/22 (!) 145/53   Wt Readings from Last 3 Encounters:  08/22/22 183 lb (83 kg)  08/15/22 180 lb (81.6 kg)  08/15/22 183 lb (83 kg)   SpO2 Readings from Last 3 Encounters:  08/22/22 98%  08/15/22 100%  08/15/22 100%      Physical Exam Vitals and nursing note reviewed.  Constitutional:      Appearance: She is well-developed.  HENT:     Head: Normocephalic and atraumatic.  Eyes:     Conjunctiva/sclera: Conjunctivae normal.  Neck:     Thyroid: No thyromegaly.     Vascular: No carotid bruit or JVD.  Cardiovascular:     Rate and Rhythm: Normal rate and regular rhythm.     Heart sounds: Normal heart sounds. No murmur heard. Pulmonary:     Effort: Pulmonary effort is normal. No respiratory distress.     Breath sounds: Normal breath sounds. No wheezing or rales.  Chest:     Chest wall: No tenderness.  Musculoskeletal:     Cervical back: Normal range of motion and neck supple.  Neurological:     Mental Status: She is alert and oriented to person, place, and time.      No results found for any visits on 08/22/22.  Last CBC Lab Results  Component Value Date   WBC 8.4 08/15/2022   HGB 11.8 (L) 08/15/2022   HCT 35.6 (L) 08/15/2022   MCV 97.0 08/15/2022   MCH 32.2 08/15/2022   RDW 13.2 08/15/2022   PLT 178 08/15/2022   Last metabolic panel Lab Results  Component Value Date   GLUCOSE 145 (H) 08/15/2022   NA 138 08/15/2022   K 3.8 08/15/2022   CL 107 08/15/2022   CO2 23 08/15/2022   BUN 35 (H) 08/15/2022   CREATININE 1.35 (H) 08/15/2022   GFRNONAA 38 (L) 08/15/2022   CALCIUM 8.6 (L) 08/15/2022   PHOS 3.0 03/06/2012   PROT 6.6 08/15/2022   ALBUMIN 3.3 (L) 08/15/2022   BILITOT 0.5 08/15/2022   ALKPHOS 93 08/15/2022   AST 22 08/15/2022   ALT 19 08/15/2022   ANIONGAP 8 08/15/2022   Last lipids Lab Results  Component Value Date   CHOL 113 07/05/2022   HDL 63.90 07/05/2022   LDLCALC 26  07/05/2022   LDLDIRECT 82.5 09/11/2009   TRIG 114.0 07/05/2022   CHOLHDL 2 07/05/2022   Last hemoglobin A1c Lab Results  Component Value Date   HGBA1C 7.7 (H) 07/05/2022   Last thyroid functions Lab Results  Component Value Date   TSH 4.78 03/31/2022   Last vitamin D Lab Results  Component Value Date   VD25OH 41.98 03/31/2022   Last vitamin B12 and Folate Lab Results  Component Value Date   VITAMINB12 318 03/31/2022      The ASCVD Risk score (Arnett DK, et al., 2019) failed to calculate for the following reasons:   The 2019 ASCVD risk score is only valid for ages 34 to 13    Assessment & Plan:   Problem List Items Addressed This Visit       Unprioritized   Tremor - Primary    Pt has app with neuro later this week      Need for pneumococcal 20-valent conjugate vaccination   Relevant Orders  Pneumococcal conjugate vaccine 20-valent (Prevnar 20) (Completed)   Dizziness    Stopped when jardiance was stopped        Return if symptoms worsen or fail to improve.    Donato Schultz, DO

## 2022-08-22 NOTE — Patient Instructions (Signed)
Dizziness Dizziness is a common problem. It is a feeling of unsteadiness or light-headedness. You may feel like you are about to faint. Dizziness can lead to injury if you stumble or fall. Anyone can become dizzy, but dizziness is more common in older adults. This condition can be caused by a number of things, including medicines, dehydration, or illness. Follow these instructions at home: Eating and drinking  Drink enough fluid to keep your urine pale yellow. This helps to keep you from becoming dehydrated. Try to drink more clear fluids, such as water. Do not drink alcohol. Limit your caffeine intake if told to do so by your health care provider. Check ingredients and nutrition facts to see if a food or beverage contains caffeine. Limit your salt (sodium) intake if told to do so by your health care provider. Check ingredients and nutrition facts to see if a food or beverage contains sodium. Activity  Avoid making quick movements. Rise slowly from chairs and steady yourself until you feel okay. In the morning, first sit up on the side of the bed. When you feel okay, stand slowly while you hold onto something until you know that your balance is good. If you need to stand in one place for a long time, move your legs often. Tighten and relax the muscles in your legs while you are standing. Do not drive or use machinery if you feel dizzy. Avoid bending down if you feel dizzy. Place items in your home so that they are easy for you to reach without leaning over. Lifestyle Do not use any products that contain nicotine or tobacco. These products include cigarettes, chewing tobacco, and vaping devices, such as e-cigarettes. If you need help quitting, ask your health care provider. Try to reduce your stress level by using methods such as yoga or meditation. Talk with your health care provider if you need help to manage your stress. General instructions Watch your dizziness for any changes. Take  over-the-counter and prescription medicines only as told by your health care provider. Talk with your health care provider if you think that your dizziness is caused by a medicine that you are taking. Tell a friend or a family member that you are feeling dizzy. If he or she notices any changes in your behavior, have this person call your health care provider. Keep all follow-up visits. This is important. Contact a health care provider if: Your dizziness does not go away or you have new symptoms. Your dizziness or light-headedness gets worse. You feel nauseous. You have reduced hearing. You have a fever. You have neck pain or a stiff neck. Your dizziness leads to an injury or a fall. Get help right away if: You vomit or have diarrhea and are unable to eat or drink anything. You have problems talking, walking, swallowing, or using your arms, hands, or legs. You feel generally weak. You have any bleeding. You are not thinking clearly or you have trouble forming sentences. It may take a friend or family member to notice this. You have chest pain, abdominal pain, shortness of breath, or sweating. Your vision changes or you develop a severe headache. These symptoms may represent a serious problem that is an emergency. Do not wait to see if the symptoms will go away. Get medical help right away. Call your local emergency services (911 in the [place] Do not drive yourself to the hospital. Summary Dizziness is a feeling of unsteadiness or light-headedness. This condition can be caused by a number of   things, including medicines, dehydration, or illness. Anyone can become dizzy, but dizziness is more common in older adults. Drink enough fluid to keep your urine pale yellow. Do not drink alcohol. Avoid making quick movements if you feel dizzy. Monitor your dizziness for any changes. This information is not intended to replace advice given to you by your health care provider. Make sure you discuss any  questions you have with your health care provider. Document Revised: 03/02/2020 Document Reviewed: 03/02/2020 Elsevier Patient Education  2023 Elsevier Inc.  

## 2022-08-22 NOTE — Assessment & Plan Note (Signed)
Stopped when jardiance was stopped

## 2022-08-24 NOTE — Progress Notes (Deleted)
Assessment/Plan:   ***  Subjective:   [name] was seen today in the movement disorders clinic for neurologic consultation at the request of Donato Schultz, *.  The consultation is for the evaluation of tremor.Patient developed a tremor and was worried because her brother had Parkinson's disease  Tremor: {yes no:314532}   How long has it been going on? ***  At rest or with activation?  ***  When is it noted the most?  ***  Fam hx of tremor?  {yes no:314532}brother with fam hx of Parkinsons Disease   Located where?  ***  Affected by caffeine:  {yes no:314532}  Affected by alcohol:  {yes no:314532}  Affected by stress:  {yes no:314532}  Affected by fatigue:  {yes no:314532}  Spills soup if on spoon:  {yes no:314532}  Spills glass of liquid if full:  {yes no:314532}  Affects ADL's (tying shoes, brushing teeth, etc):  {yes no:314532}  Tremor inducing meds:  {yes no:314532}  Other Specific Symptoms:  Voice: *** Sleep: ***  Vivid Dreams:  {yes no:314532}  Acting out dreams:  {yes no:314532} Wet Pillows: {yes no:314532} Postural symptoms:  {yes no:314532}  Falls?  {yes no:314532} Bradykinesia symptoms: {parkinson brady:18041} Loss of smell:  {yes no:314532} Loss of taste:  {yes no:314532} Urinary Incontinence:  {yes no:314532} Difficulty Swallowing:  {yes no:314532} Handwriting, micrographia: {yes no:314532} Trouble with ADL's:  {yes no:314532}  Trouble buttoning clothing: {yes no:314532} Depression:  {yes no:314532} Memory changes:  {yes no:314532} Hallucinations:  {yes no:314532}  visual distortions: {yes no:314532} N/V:  {yes no:314532} Lightheaded:  {yes no:314532}  Syncope: {yes no:314532} Diplopia:  {yes no:314532} Dyskinesia:  {yes no:314532}  Last neuroimaging of the brain was a 2016 head CT, which was nonacute.  PREVIOUS MEDICATIONS: {Parkinson's RX:18200}  ALLERGIES:   Allergies  Allergen Reactions   Ciprofloxacin Nausea And Vomiting and  Other (See Comments)    syncope   Codeine Other (See Comments)    HALLUCINATIONS   Hydrocodone Other (See Comments)    Makes her pass out   Penicillins Hives    Has patient had a PCN reaction causing immediate rash, facial/tongue/throat swelling, SOB or lightheadedness with hypotension: No Has patient had a PCN reaction causing severe rash involving mucus membranes or skin necrosis: Yes Has patient had a PCN reaction that required hospitalization: in the hospital at time of reaction Has patient had a PCN reaction occurring within the last 10 years: Unknown If all of the above answers are "NO", then may proceed with Cephalosporin use.   Shellfish Allergy Hives   Diltiazem Hcl Other (See Comments)    : low heart rate   Sulfonamide Derivatives Nausea And Vomiting   Morphine And Codeine Other (See Comments)    Went crazy   Lovastatin Other (See Comments)    Unknown reaction   Metformin Diarrhea    CURRENT MEDICATIONS:  Current Outpatient Medications  Medication Instructions   acetaminophen (TYLENOL) 500 mg, Oral, Every 6 hours PRN   allopurinol (ZYLOPRIM) 100 mg, Oral, Daily at bedtime   amLODipine (NORVASC) 5 mg, Oral, 2 times daily   atorvastatin (LIPITOR) 20 MG tablet TAKE 1 TABLET BY MOUTH ONCE DAILY   azelastine (OPTIVAR) 0.05 % ophthalmic solution 1 drop, Both Eyes, 2 times daily   carvedilol (COREG) 12.5 mg, Oral, 2 times daily   clopidogrel (PLAVIX) 75 MG tablet TAKE 1 TABLET BY MOUTH  DAILY   dicyclomine (BENTYL) 10 mg, Oral, 3 times daily before meals & bedtime   ENTRESTO  97-103 MG TAKE 1 TABLET BY MOUTH TWICE  DAILY   fidaxomicin (DIFICID) 200 mg, Oral, 2 times daily   furosemide (LASIX) 40 mg, Oral, Daily   Insulin NPH, Human,, Isophane, (HUMULIN N KWIKPEN) 100 UNIT/ML Kiwkpen Per sliding scale -- no more than 40 u a day   Insulin Pen Needle (BD PEN NEEDLE MICRO U/F) 32G X 6 MM MISC To inject Humalin   Multiple Vitamin (MULTIVITAMIN WITH MINERALS) TABS tablet 1 tablet,  Oral, Daily at bedtime   nitroGLYCERIN (NITROSTAT) 0.4 MG SL tablet DISSOLVE ONE TABLET UNDER THE TONGUE EVERY 5 MINUTES AS NEEDED FOR CHEST PAIN.  DO NOT EXCEED A TOTAL OF 3 DOSES IN 15 MINUTES   omeprazole (PRILOSEC) 20 mg, Oral, Daily   ONE TOUCH ULTRA TEST test strip 1 each, Other, Daily PRN   potassium chloride (KLOR-CON) 10 MEQ tablet 10 mEq, Oral, Daily   tobramycin (TOBREX) 0.3 % ophthalmic solution 1 drop, Left Eye, 4 times daily   triamcinolone cream (KENALOG) 0.1 % 1 Application, Topical, 2 times daily    Objective:   PHYSICAL EXAMINATION:    VITALS:  There were no vitals filed for this visit.  GEN:  The patient appears stated age and is in NAD. HEENT:  Normocephalic, atraumatic.  The mucous membranes are moist. The superficial temporal arteries are without ropiness or tenderness. CV:  RRR Lungs:  CTAB Neck/HEME:  There are no carotid bruits bilaterally.  Neurological examination:  Orientation: The patient is alert and oriented x3.  Cranial nerves: There is good facial symmetry.  Extraocular muscles are intact. The visual fields are full to confrontational testing. The speech is fluent and clear. Soft palate rises symmetrically and there is no tongue deviation. Hearing is intact to conversational tone. Sensation: Sensation is intact to light touch throughout (facial, trunk, extremities). Vibration is intact at the bilateral big toe. There is no extinction with double simultaneous stimulation.  Motor: Strength is 5/5 in the bilateral upper and lower extremities.   Shoulder shrug is equal and symmetric.  There is no pronator drift. Deep tendon reflexes: Deep tendon reflexes are 2/4 at the bilateral biceps, triceps, brachioradialis, patella and achilles. Plantar responses are downgoing bilaterally.  Movement examination: Tone: There is ***tone in the bilateral upper extremities.  The tone in the lower extremities is ***.  Abnormal movements: *** Coordination:  There is ***  decremation with RAM's, *** Gait and Station: The patient has *** difficulty arising out of a deep-seated chair without the use of the hands. The patient's stride length is ***.  The patient has a *** pull test.     I have reviewed and interpreted the following labs independently   Chemistry      Component Value Date/Time   NA 138 08/15/2022 1716   NA 141 05/17/2022 1355   K 3.8 08/15/2022 1716   CL 107 08/15/2022 1716   CO2 23 08/15/2022 1716   BUN 35 (H) 08/15/2022 1716   BUN 27 05/17/2022 1355   CREATININE 1.35 (H) 08/15/2022 1716   CREATININE 0.99 (H) 05/11/2015 0748      Component Value Date/Time   CALCIUM 8.6 (L) 08/15/2022 1716   ALKPHOS 93 08/15/2022 1716   AST 22 08/15/2022 1716   ALT 19 08/15/2022 1716   BILITOT 0.5 08/15/2022 1716      Lab Results  Component Value Date   TSH 4.78 03/31/2022   Lab Results  Component Value Date   WBC 8.4 08/15/2022   HGB 11.8 (L) 08/15/2022  HCT 35.6 (L) 08/15/2022   MCV 97.0 08/15/2022   PLT 178 08/15/2022      Total time spent on today's visit was ***greater than 60 minutes, including both face-to-face time and nonface-to-face time.  Time included that spent on review of records (prior notes available to me/labs/imaging if pertinent), discussing treatment and goals, answering patient's questions and coordinating care.  Cc:  Donato Schultz, DO

## 2022-08-25 DIAGNOSIS — N179 Acute kidney failure, unspecified: Secondary | ICD-10-CM | POA: Diagnosis not present

## 2022-08-25 DIAGNOSIS — E039 Hypothyroidism, unspecified: Secondary | ICD-10-CM | POA: Diagnosis not present

## 2022-08-25 DIAGNOSIS — M48061 Spinal stenosis, lumbar region without neurogenic claudication: Secondary | ICD-10-CM | POA: Diagnosis not present

## 2022-08-25 DIAGNOSIS — M8580 Other specified disorders of bone density and structure, unspecified site: Secondary | ICD-10-CM | POA: Diagnosis not present

## 2022-08-25 DIAGNOSIS — I495 Sick sinus syndrome: Secondary | ICD-10-CM | POA: Diagnosis not present

## 2022-08-25 DIAGNOSIS — K5730 Diverticulosis of large intestine without perforation or abscess without bleeding: Secondary | ICD-10-CM | POA: Diagnosis not present

## 2022-08-25 DIAGNOSIS — E1122 Type 2 diabetes mellitus with diabetic chronic kidney disease: Secondary | ICD-10-CM | POA: Diagnosis not present

## 2022-08-25 DIAGNOSIS — M1030 Gout due to renal impairment, unspecified site: Secondary | ICD-10-CM | POA: Diagnosis not present

## 2022-08-25 DIAGNOSIS — K3184 Gastroparesis: Secondary | ICD-10-CM | POA: Diagnosis not present

## 2022-08-25 DIAGNOSIS — I2510 Atherosclerotic heart disease of native coronary artery without angina pectoris: Secondary | ICD-10-CM | POA: Diagnosis not present

## 2022-08-25 DIAGNOSIS — E11620 Type 2 diabetes mellitus with diabetic dermatitis: Secondary | ICD-10-CM | POA: Diagnosis not present

## 2022-08-25 DIAGNOSIS — D631 Anemia in chronic kidney disease: Secondary | ICD-10-CM | POA: Diagnosis not present

## 2022-08-25 DIAGNOSIS — K2270 Barrett's esophagus without dysplasia: Secondary | ICD-10-CM | POA: Diagnosis not present

## 2022-08-25 DIAGNOSIS — N183 Chronic kidney disease, stage 3 unspecified: Secondary | ICD-10-CM | POA: Diagnosis not present

## 2022-08-25 DIAGNOSIS — F32A Depression, unspecified: Secondary | ICD-10-CM | POA: Diagnosis not present

## 2022-08-25 DIAGNOSIS — E1151 Type 2 diabetes mellitus with diabetic peripheral angiopathy without gangrene: Secondary | ICD-10-CM | POA: Diagnosis not present

## 2022-08-25 DIAGNOSIS — M5136 Other intervertebral disc degeneration, lumbar region: Secondary | ICD-10-CM | POA: Diagnosis not present

## 2022-08-25 DIAGNOSIS — K219 Gastro-esophageal reflux disease without esophagitis: Secondary | ICD-10-CM | POA: Diagnosis not present

## 2022-08-25 DIAGNOSIS — M25562 Pain in left knee: Secondary | ICD-10-CM | POA: Diagnosis not present

## 2022-08-25 DIAGNOSIS — I130 Hypertensive heart and chronic kidney disease with heart failure and stage 1 through stage 4 chronic kidney disease, or unspecified chronic kidney disease: Secondary | ICD-10-CM | POA: Diagnosis not present

## 2022-08-25 DIAGNOSIS — E785 Hyperlipidemia, unspecified: Secondary | ICD-10-CM | POA: Diagnosis not present

## 2022-08-25 DIAGNOSIS — E1143 Type 2 diabetes mellitus with diabetic autonomic (poly)neuropathy: Secondary | ICD-10-CM | POA: Diagnosis not present

## 2022-08-25 DIAGNOSIS — I5032 Chronic diastolic (congestive) heart failure: Secondary | ICD-10-CM | POA: Diagnosis not present

## 2022-08-26 ENCOUNTER — Ambulatory Visit: Payer: Medicare Other | Admitting: Neurology

## 2022-08-29 ENCOUNTER — Telehealth: Payer: Self-pay | Admitting: Family Medicine

## 2022-08-29 DIAGNOSIS — K3184 Gastroparesis: Secondary | ICD-10-CM | POA: Diagnosis not present

## 2022-08-29 DIAGNOSIS — D631 Anemia in chronic kidney disease: Secondary | ICD-10-CM | POA: Diagnosis not present

## 2022-08-29 DIAGNOSIS — E785 Hyperlipidemia, unspecified: Secondary | ICD-10-CM | POA: Diagnosis not present

## 2022-08-29 DIAGNOSIS — E1151 Type 2 diabetes mellitus with diabetic peripheral angiopathy without gangrene: Secondary | ICD-10-CM | POA: Diagnosis not present

## 2022-08-29 DIAGNOSIS — M5136 Other intervertebral disc degeneration, lumbar region: Secondary | ICD-10-CM | POA: Diagnosis not present

## 2022-08-29 DIAGNOSIS — K219 Gastro-esophageal reflux disease without esophagitis: Secondary | ICD-10-CM | POA: Diagnosis not present

## 2022-08-29 DIAGNOSIS — I495 Sick sinus syndrome: Secondary | ICD-10-CM | POA: Diagnosis not present

## 2022-08-29 DIAGNOSIS — I2510 Atherosclerotic heart disease of native coronary artery without angina pectoris: Secondary | ICD-10-CM | POA: Diagnosis not present

## 2022-08-29 DIAGNOSIS — N179 Acute kidney failure, unspecified: Secondary | ICD-10-CM | POA: Diagnosis not present

## 2022-08-29 DIAGNOSIS — I5032 Chronic diastolic (congestive) heart failure: Secondary | ICD-10-CM | POA: Diagnosis not present

## 2022-08-29 DIAGNOSIS — I130 Hypertensive heart and chronic kidney disease with heart failure and stage 1 through stage 4 chronic kidney disease, or unspecified chronic kidney disease: Secondary | ICD-10-CM | POA: Diagnosis not present

## 2022-08-29 DIAGNOSIS — E1143 Type 2 diabetes mellitus with diabetic autonomic (poly)neuropathy: Secondary | ICD-10-CM | POA: Diagnosis not present

## 2022-08-29 DIAGNOSIS — K2270 Barrett's esophagus without dysplasia: Secondary | ICD-10-CM | POA: Diagnosis not present

## 2022-08-29 DIAGNOSIS — N183 Chronic kidney disease, stage 3 unspecified: Secondary | ICD-10-CM | POA: Diagnosis not present

## 2022-08-29 DIAGNOSIS — M1030 Gout due to renal impairment, unspecified site: Secondary | ICD-10-CM | POA: Diagnosis not present

## 2022-08-29 DIAGNOSIS — E1122 Type 2 diabetes mellitus with diabetic chronic kidney disease: Secondary | ICD-10-CM | POA: Diagnosis not present

## 2022-08-29 DIAGNOSIS — M25562 Pain in left knee: Secondary | ICD-10-CM | POA: Diagnosis not present

## 2022-08-29 DIAGNOSIS — E11620 Type 2 diabetes mellitus with diabetic dermatitis: Secondary | ICD-10-CM | POA: Diagnosis not present

## 2022-08-29 DIAGNOSIS — F32A Depression, unspecified: Secondary | ICD-10-CM | POA: Diagnosis not present

## 2022-08-29 DIAGNOSIS — E039 Hypothyroidism, unspecified: Secondary | ICD-10-CM | POA: Diagnosis not present

## 2022-08-29 DIAGNOSIS — M48061 Spinal stenosis, lumbar region without neurogenic claudication: Secondary | ICD-10-CM | POA: Diagnosis not present

## 2022-08-29 DIAGNOSIS — K5730 Diverticulosis of large intestine without perforation or abscess without bleeding: Secondary | ICD-10-CM | POA: Diagnosis not present

## 2022-08-29 DIAGNOSIS — M8580 Other specified disorders of bone density and structure, unspecified site: Secondary | ICD-10-CM | POA: Diagnosis not present

## 2022-08-29 NOTE — Telephone Encounter (Signed)
FYI

## 2022-08-29 NOTE — Telephone Encounter (Signed)
Pt called. LVM to call back or if sxs worsen to go to the ED

## 2022-08-29 NOTE — Telephone Encounter (Addendum)
Initial Comment Joni Reining with [facility] calling on behalf on mutual patient. She said the patient reports dizziness all weekend mostly in standing. She said blood pressure is a bit high160/80 and it does not go down standing up. Pneumonia vaccine last Tuesday and says the arm has been itching since then. She also reports pain in right jaw area since yesterday. Blood sugar was 160 and patient was showing difficulty in checking her blood sugar and managing Humolog. She is goig to get a nurse to evaluate her and go out and provide education on how to manage it. Translation No Disp. Time Lamount Cohen Time) Disposition Final User 08/29/2022 2:56:38 PM Attempt made - message left Leanord Hawking 08/29/2022 3:04:36 PM Attempt made - message left Leanord Hawking 08/29/2022 3:24:56 PM FINAL ATTEMPT MADE - no message left Yes Kizzie Bane RN, Marylene Land Final Disposition 08/29/2022 3:24:56 PM FINAL ATTEMPT MADE - no message left Yes Kizzie Bane, RN, Angela     Phone number- [phone] (Primary), [phone] (Secondary)

## 2022-08-29 NOTE — Telephone Encounter (Signed)
[facility] - Joni Reining  [phone]  Joni Reining wanted to make PCP aware that they will have an OT eval for the pt to work on shoulder strength, a call back is not required.

## 2022-08-29 NOTE — Telephone Encounter (Signed)
FYI: This call has been transferred to Access Nurse. Once the result note has been entered staff can address the message at that time.  Patient called in with the following symptoms:  Red Word:dizziness  and Itching around injection site   Please advise at Mobile [phone] (mobile)  Message is routed to Provider Pool and [facility] Triage

## 2022-08-29 NOTE — Telephone Encounter (Signed)
Noted. FYI 

## 2022-08-31 ENCOUNTER — Encounter (HOSPITAL_BASED_OUTPATIENT_CLINIC_OR_DEPARTMENT_OTHER): Payer: Self-pay

## 2022-08-31 ENCOUNTER — Inpatient Hospital Stay (HOSPITAL_BASED_OUTPATIENT_CLINIC_OR_DEPARTMENT_OTHER)
Admission: EM | Admit: 2022-08-31 | Discharge: 2022-09-07 | DRG: 554 | Disposition: A | Discharge status: Skilled Nursing Facility | Payer: Medicare Other | Attending: Internal Medicine | Admitting: Internal Medicine

## 2022-08-31 ENCOUNTER — Other Ambulatory Visit: Payer: Self-pay

## 2022-08-31 ENCOUNTER — Emergency Department (HOSPITAL_BASED_OUTPATIENT_CLINIC_OR_DEPARTMENT_OTHER): Payer: Medicare Other

## 2022-08-31 DIAGNOSIS — Z87891 Personal history of nicotine dependence: Secondary | ICD-10-CM | POA: Diagnosis not present

## 2022-08-31 DIAGNOSIS — E785 Hyperlipidemia, unspecified: Secondary | ICD-10-CM | POA: Diagnosis present

## 2022-08-31 DIAGNOSIS — Z885 Allergy status to narcotic agent status: Secondary | ICD-10-CM

## 2022-08-31 DIAGNOSIS — Z8619 Personal history of other infectious and parasitic diseases: Secondary | ICD-10-CM

## 2022-08-31 DIAGNOSIS — M8580 Other specified disorders of bone density and structure, unspecified site: Secondary | ICD-10-CM | POA: Diagnosis present

## 2022-08-31 DIAGNOSIS — E11620 Type 2 diabetes mellitus with diabetic dermatitis: Secondary | ICD-10-CM | POA: Diagnosis present

## 2022-08-31 DIAGNOSIS — N1832 Chronic kidney disease, stage 3b: Secondary | ICD-10-CM | POA: Diagnosis present

## 2022-08-31 DIAGNOSIS — Z881 Allergy status to other antibiotic agents status: Secondary | ICD-10-CM

## 2022-08-31 DIAGNOSIS — M25552 Pain in left hip: Secondary | ICD-10-CM | POA: Diagnosis not present

## 2022-08-31 DIAGNOSIS — R627 Adult failure to thrive: Secondary | ICD-10-CM | POA: Diagnosis not present

## 2022-08-31 DIAGNOSIS — E1143 Type 2 diabetes mellitus with diabetic autonomic (poly)neuropathy: Secondary | ICD-10-CM | POA: Diagnosis present

## 2022-08-31 DIAGNOSIS — E1151 Type 2 diabetes mellitus with diabetic peripheral angiopathy without gangrene: Secondary | ICD-10-CM | POA: Diagnosis present

## 2022-08-31 DIAGNOSIS — R262 Difficulty in walking, not elsewhere classified: Secondary | ICD-10-CM | POA: Diagnosis present

## 2022-08-31 DIAGNOSIS — Z955 Presence of coronary angioplasty implant and graft: Secondary | ICD-10-CM | POA: Diagnosis not present

## 2022-08-31 DIAGNOSIS — R410 Disorientation, unspecified: Secondary | ICD-10-CM | POA: Diagnosis not present

## 2022-08-31 DIAGNOSIS — F419 Anxiety disorder, unspecified: Secondary | ICD-10-CM | POA: Diagnosis present

## 2022-08-31 DIAGNOSIS — F32A Depression, unspecified: Secondary | ICD-10-CM | POA: Diagnosis not present

## 2022-08-31 DIAGNOSIS — K3184 Gastroparesis: Secondary | ICD-10-CM | POA: Diagnosis not present

## 2022-08-31 DIAGNOSIS — I130 Hypertensive heart and chronic kidney disease with heart failure and stage 1 through stage 4 chronic kidney disease, or unspecified chronic kidney disease: Secondary | ICD-10-CM | POA: Diagnosis present

## 2022-08-31 DIAGNOSIS — Z7902 Long term (current) use of antithrombotics/antiplatelets: Secondary | ICD-10-CM

## 2022-08-31 DIAGNOSIS — Z8249 Family history of ischemic heart disease and other diseases of the circulatory system: Secondary | ICD-10-CM

## 2022-08-31 DIAGNOSIS — M519 Unspecified thoracic, thoracolumbar and lumbosacral intervertebral disc disorder: Secondary | ICD-10-CM | POA: Diagnosis present

## 2022-08-31 DIAGNOSIS — Z951 Presence of aortocoronary bypass graft: Secondary | ICD-10-CM | POA: Diagnosis not present

## 2022-08-31 DIAGNOSIS — E039 Hypothyroidism, unspecified: Secondary | ICD-10-CM | POA: Diagnosis not present

## 2022-08-31 DIAGNOSIS — I10 Essential (primary) hypertension: Secondary | ICD-10-CM | POA: Diagnosis not present

## 2022-08-31 DIAGNOSIS — I5032 Chronic diastolic (congestive) heart failure: Secondary | ICD-10-CM | POA: Diagnosis present

## 2022-08-31 DIAGNOSIS — M79603 Pain in arm, unspecified: Secondary | ICD-10-CM | POA: Diagnosis not present

## 2022-08-31 DIAGNOSIS — Z66 Do not resuscitate: Secondary | ICD-10-CM | POA: Diagnosis not present

## 2022-08-31 DIAGNOSIS — E1122 Type 2 diabetes mellitus with diabetic chronic kidney disease: Secondary | ICD-10-CM | POA: Diagnosis present

## 2022-08-31 DIAGNOSIS — Z90710 Acquired absence of both cervix and uterus: Secondary | ICD-10-CM

## 2022-08-31 DIAGNOSIS — R6889 Other general symptoms and signs: Secondary | ICD-10-CM | POA: Diagnosis not present

## 2022-08-31 DIAGNOSIS — M48061 Spinal stenosis, lumbar region without neurogenic claudication: Secondary | ICD-10-CM | POA: Diagnosis present

## 2022-08-31 DIAGNOSIS — Z882 Allergy status to sulfonamides status: Secondary | ICD-10-CM

## 2022-08-31 DIAGNOSIS — N183 Chronic kidney disease, stage 3 unspecified: Secondary | ICD-10-CM | POA: Diagnosis present

## 2022-08-31 DIAGNOSIS — M546 Pain in thoracic spine: Secondary | ICD-10-CM

## 2022-08-31 DIAGNOSIS — M1712 Unilateral primary osteoarthritis, left knee: Principal | ICD-10-CM | POA: Diagnosis present

## 2022-08-31 DIAGNOSIS — Z950 Presence of cardiac pacemaker: Secondary | ICD-10-CM | POA: Diagnosis not present

## 2022-08-31 DIAGNOSIS — M25562 Pain in left knee: Secondary | ICD-10-CM | POA: Diagnosis present

## 2022-08-31 DIAGNOSIS — I495 Sick sinus syndrome: Secondary | ICD-10-CM | POA: Diagnosis not present

## 2022-08-31 DIAGNOSIS — M79605 Pain in left leg: Secondary | ICD-10-CM | POA: Diagnosis not present

## 2022-08-31 DIAGNOSIS — Z79899 Other long term (current) drug therapy: Secondary | ICD-10-CM

## 2022-08-31 DIAGNOSIS — Z743 Need for continuous supervision: Secondary | ICD-10-CM | POA: Diagnosis not present

## 2022-08-31 DIAGNOSIS — H3530 Unspecified macular degeneration: Secondary | ICD-10-CM | POA: Diagnosis present

## 2022-08-31 DIAGNOSIS — I252 Old myocardial infarction: Secondary | ICD-10-CM | POA: Diagnosis not present

## 2022-08-31 DIAGNOSIS — Z794 Long term (current) use of insulin: Secondary | ICD-10-CM

## 2022-08-31 DIAGNOSIS — Z91013 Allergy to seafood: Secondary | ICD-10-CM

## 2022-08-31 DIAGNOSIS — K2270 Barrett's esophagus without dysplasia: Secondary | ICD-10-CM | POA: Diagnosis present

## 2022-08-31 DIAGNOSIS — Z833 Family history of diabetes mellitus: Secondary | ICD-10-CM

## 2022-08-31 DIAGNOSIS — Z888 Allergy status to other drugs, medicaments and biological substances status: Secondary | ICD-10-CM

## 2022-08-31 DIAGNOSIS — M109 Gout, unspecified: Secondary | ICD-10-CM | POA: Diagnosis present

## 2022-08-31 DIAGNOSIS — Z880 Allergy status to penicillin: Secondary | ICD-10-CM

## 2022-08-31 DIAGNOSIS — I2510 Atherosclerotic heart disease of native coronary artery without angina pectoris: Secondary | ICD-10-CM | POA: Diagnosis not present

## 2022-08-31 DIAGNOSIS — S79919A Unspecified injury of unspecified hip, initial encounter: Secondary | ICD-10-CM | POA: Diagnosis not present

## 2022-08-31 DIAGNOSIS — K219 Gastro-esophageal reflux disease without esophagitis: Secondary | ICD-10-CM | POA: Diagnosis present

## 2022-08-31 HISTORY — DX: Unspecified macular degeneration: H35.30

## 2022-08-31 NOTE — ED Notes (Signed)
Dr. Molpus at bedside. 

## 2022-08-31 NOTE — ED Provider Notes (Incomplete)
[facility] Provider Note: Lowella Dell, MD, FACEP  CSN: [id number] MRN: [id number] ARRIVAL: 08/31/22 at 2255 ROOM: MH01/MH01   CHIEF COMPLAINT  Leg Pain  Level 5 caveat: confusion HISTORY OF PRESENT ILLNESS  08/31/22 11:08 PM [name] is a 89 y.o. female with aching pain in her left leg from her hip down to her knee.  The onset was this evening after walking to and from the restroom.  There was no reported fall or injury but she states the pain is a 10 out of 10.  It is worse with movement of the left hip.    Past Medical History:  Diagnosis Date   Allergic rhinitis    Anemia    Anxiety    Barrett esophagus    CAD (coronary artery disease) 2009   a. Multivessel s/p PCI w/DES 2009 // b. s/p CABG 2011  //  c. LHC 8/15: pLAD 95 ISR, LCx 100, pOM1 40, dRCA 100, S-OM1/OM2 ok, S-D1 ok, S-PDA ok, L-LAD ok, EF 60%   Carotid artery disease (HCC)    a. Carotid US 9/15: RICA 1-39%; LICA 40-59% >> FU 1 year  //  b. Carotid US 9/17: R 1-39%, L 40-59% >> FU 1 year   Chronic diastolic heart failure (HCC)    CKD (chronic kidney disease), stage II    GFR 60-89 ml/min   Depression    Disc disease, degenerative, cervical    Diverticulosis    Gastroparesis    GERD (gastroesophageal reflux disease)    Gout    H/O hiatal hernia    Helicobacter pylori gastritis    History of echocardiogram    a. Echo 11/13: EF 55% to 60%. Grade 2 diastolic dysfunction, MAC, trivial MR, mild LAE, normal RVSF, mild RAE, PASP 39 mmHg  //  b. Echo 4/17: EF 55-60%, normal wall motion, trivial AI, MAC, moderate LAE, mild RVE, PASP 35 mmHg   History of thrombocytopenia    HTN (hypertension)    Hyperlipidemia    Hypothyroidism    LBP (low back pain)    Lumbar disc disease/lumbar spinal stenosis   Macular degeneration    Morbid obesity (HCC)    Myocardial infarction (HCC)    Osteoarthritis    Osteopenia    PVD (peripheral vascular disease) (HCC)    Sick sinus syndrome (HCC)    MDT  Dual-chamber PPM implant 02/2012   Type II or unspecified type diabetes mellitus without mention of complication, not stated as uncontrolled     Past Surgical History:  Procedure Laterality Date   ABDOMINAL HYSTERECTOMY     CARDIAC CATHETERIZATION     2011  DR COOPER (APPT NEXT WEEK)   CHOLECYSTECTOMY     CORONARY ARTERY BYPASS GRAFT  2011   LIMA-LAD, SVG-DIAG, SVG-OM1-OM2, SVG-PDA   CORONARY STENT PLACEMENT     Drug-eluting stent to the left anterior descending, circumflex and right coronary artery in Jan 2009   EYE SURGERY     BIL CATARACT REMOVAL 06/2010   LEFT HEART CATHETERIZATION WITH CORONARY ANGIOGRAM N/A 12/02/2013   Procedure: LEFT HEART CATHETERIZATION WITH CORONARY ANGIOGRAM;  Surgeon: Lesleigh Noe, MD;  Location: [facility] CATH LAB;  Service: Cardiovascular;  Laterality: N/A;   OVARIAN CYST REMOVAL     PACEMAKER INSERTION  03/06/12   MDT Adapta L implanted by Dr Johney Frame for SSS   PERMANENT PACEMAKER INSERTION N/A 03/06/2012   Procedure: PERMANENT PACEMAKER INSERTION;  Surgeon: Hillis Range, MD;  Location: [facility] CATH LAB;  Service: Cardiovascular;  Laterality: N/A;   SHOULDER ARTHROSCOPY  06/16/2011   Procedure: ARTHROSCOPY SHOULDER;  Surgeon: Kennieth Rad, MD;  Location: [facility] OR;  Service: Orthopedics;  Laterality: Left;  LEFT SHOULDER ARTHROSCOPY ACROMIALPLASTY, POSSIBLE MINI OPEN CUFF REPAIR    TUBAL LIGATION      Family History  Problem Relation Age of Onset   Diabetes Mother    Hypertension Mother    Heart attack Mother    Stroke Father    Parkinson's disease Brother    Coronary artery disease Other     Social History   Tobacco Use   Smoking status: Former   Smokeless tobacco: Never   Tobacco comments:    quit 30 yrs ago  Vaping Use   Vaping Use: Never used  Substance Use Topics   Alcohol use: No   Drug use: No    Prior to Admission medications   Medication Sig Start Date End Date Taking? Authorizing Provider  acetaminophen (TYLENOL) 500 MG tablet Take  500 mg by mouth every 6 (six) hours as needed for headache (pain).    [provider]  allopurinol (ZYLOPRIM) 100 MG tablet Take 100 mg by mouth at bedtime.     [provider]  amLODipine (NORVASC) 5 MG tablet Take 5 mg by mouth 2 (two) times daily.    [provider]  atorvastatin (LIPITOR) 20 MG tablet TAKE 1 TABLET BY MOUTH ONCE DAILY 09/07/21   Tonny Bollman, MD  azelastine (OPTIVAR) 0.05 % ophthalmic solution Place 1 drop into both eyes 2 (two) times daily. 10/26/15   [provider]  carvedilol (COREG) 12.5 MG tablet TAKE 1 TABLET BY MOUTH TWICE  DAILY 03/07/22   Tonny Bollman, MD  clopidogrel (PLAVIX) 75 MG tablet TAKE 1 TABLET BY MOUTH  DAILY 08/15/22   Tereso Newcomer T, PA-C  dicyclomine (BENTYL) 10 MG capsule Take 1 capsule (10 mg total) by mouth 4 (four) times daily -  before meals and at bedtime. 07/05/22   [name], Yvonne R, DO  ENTRESTO 97-103 MG TAKE 1 TABLET BY MOUTH TWICE  DAILY 01/27/22   Tonny Bollman, MD  fidaxomicin (DIFICID) 200 MG TABS tablet Take 1 tablet (200 mg total) by mouth 2 (two) times daily. 07/09/22   Donato Schultz, DO  furosemide (LASIX) 40 MG tablet Take 1 tablet (40 mg total) by mouth daily. 05/10/22   Tereso Newcomer T, PA-C  Insulin NPH, Human,, Isophane, (HUMULIN N KWIKPEN) 100 UNIT/ML Kiwkpen Per sliding scale -- no more than 40 u a day 05/27/22   [name], Grayling Congress, DO  Insulin Pen Needle (BD PEN NEEDLE MICRO U/F) 32G X 6 MM MISC To inject Humalin 06/01/22   [name], Grayling Congress, DO  Multiple Vitamin (MULTIVITAMIN WITH MINERALS) TABS tablet Take 1 tablet by mouth at bedtime.     [provider]  nitroGLYCERIN (NITROSTAT) 0.4 MG SL tablet DISSOLVE ONE TABLET UNDER THE TONGUE EVERY 5 MINUTES AS NEEDED FOR CHEST PAIN.  DO NOT EXCEED A TOTAL OF 3 DOSES IN 15 MINUTES Patient not taking: Reported on 08/15/2022 02/27/20   Marily Lente, NP  omeprazole (PRILOSEC) 20 MG capsule Take 1 capsule (20 mg total) by  mouth daily. 07/05/22   Seabron Spates R, DO  ONE TOUCH ULTRA TEST test strip 1 each by Other route daily as needed (blood sugar).  07/10/13   [provider]  potassium chloride (KLOR-CON) 10 MEQ tablet Take 1 tablet (10 mEq total) by  mouth daily. 07/12/22   Tereso Newcomer T, PA-C  tobramycin (TOBREX) 0.3 % ophthalmic solution Place 1 drop into the left eye 4 (four) times daily. 06/13/20   [provider]  triamcinolone cream (KENALOG) 0.1 % Apply 1 Application topically 2 (two) times daily. 03/31/22   Donato Schultz, DO    Allergies Ciprofloxacin, Codeine, Hydrocodone, Penicillins, Shellfish allergy, Diltiazem hcl, Sulfonamide derivatives, Morphine and codeine, Lovastatin, and Metformin   REVIEW OF SYSTEMS  Level 5 caveat   PHYSICAL EXAMINATION  Initial Vital Signs Blood pressure (!) 199/53, pulse 61, temperature 98.3 F (36.8 C), temperature source Oral, resp. rate 20, height 5\' 7"  (1.702 m), weight 83 kg, SpO2 100 %.  Examination General: Well-developed, well-nourished female in no acute distress; appearance consistent with age of record HENT: normocephalic; atraumatic Eyes: pupils equal, round and reactive to light; extraocular muscles grossly intact; arcus senilis bilaterally Neck: supple Heart: Irregular rhythm Lungs: clear to auscultation bilaterally Abdomen: soft; nondistended; nontender; bowel sounds present Extremities: No deformity; tenderness of left hip and pain on passive movement of left hip; trace edema of lower legs Neurologic: Awake, alert; confused; motor function intact in all extremities and symmetric; no facial droop; bilateral upper extremity tremor Skin: Warm and dry Psychiatric: Flat affect   RESULTS  Summary of this visit's results, reviewed and interpreted by myself:   EKG Interpretation  Date/Time:  Thursday Sep 01 2022 04:02:08 EDT Ventricular Rate:  74 PR Interval:  67 QRS Duration: 192 QT Interval:  476 QTC  Calculation: 480 R Axis:   -85 Text Interpretation: Wandering atrial pacemaker Nonspecific IVCD with LAD Left ventricular hypertrophy Confirmed by Judianne Seiple ([id number]) on 09/01/2022 6:19:26 AM       Laboratory Studies: Results for orders placed or performed during the hospital encounter of 08/31/22 (from the past 24 hour(s))  CBC with Differential     Status: None   Collection Time: 09/01/22  1:06 AM  Result Value Ref Range   WBC 10.4 4.0 - 10.5 K/uL   RBC 3.91 3.87 - 5.11 MIL/uL   Hemoglobin 12.3 12.0 - 15.0 g/dL   HCT 60.4 54.0 - 98.1 %   MCV 96.4 80.0 - 100.0 fL   MCH 31.5 26.0 - 34.0 pg   MCHC 32.6 30.0 - 36.0 g/dL   RDW 19.1 47.8 - 29.5 %   Platelets 170 150 - 400 K/uL   nRBC 0.0 0.0 - 0.2 %   Neutrophils Relative % 71 %   Neutro Abs 7.3 1.7 - 7.7 K/uL   Lymphocytes Relative 18 %   Lymphs Abs 1.9 0.7 - 4.0 K/uL   Monocytes Relative 9 %   Monocytes Absolute 0.9 0.1 - 1.0 K/uL   Eosinophils Relative 2 %   Eosinophils Absolute 0.3 0.0 - 0.5 K/uL   Basophils Relative 0 %   Basophils Absolute 0.0 0.0 - 0.1 K/uL   RBC Morphology MORPHOLOGY UNREMARKABLE    Smear Review MORPHOLOGY UNREMARKABLE    Immature Granulocytes 0 %   Abs Immature Granulocytes 0.04 0.00 - 0.07 K/uL   Reactive, Benign Lymphocytes PRESENT   Basic metabolic panel     Status: Abnormal   Collection Time: 09/01/22  1:06 AM  Result Value Ref Range   Sodium 134 (L) 135 - 145 mmol/L   Potassium 3.9 3.5 - 5.1 mmol/L   Chloride 100 98 - 111 mmol/L   CO2 24 22 - 32 mmol/L   Glucose, Bld 163 (H) 70 - 99 mg/dL   BUN 34 (  H) 8 - 23 mg/dL   Creatinine, Ser 1.61 (H) 0.44 - 1.00 mg/dL   Calcium 8.6 (L) 8.9 - 10.3 mg/dL   GFR, Estimated 37 (L) >60 mL/min   Anion gap 10 5 - 15  Urinalysis, Routine w reflex microscopic -Urine, Clean Catch     Status: Abnormal   Collection Time: 09/01/22  2:23 AM  Result Value Ref Range   Color, Urine YELLOW YELLOW   APPearance CLEAR CLEAR   Specific Gravity, Urine 1.015 1.005 -  1.030   pH 6.5 5.0 - 8.0   Glucose, UA NEGATIVE NEGATIVE mg/dL   Hgb urine dipstick NEGATIVE NEGATIVE   Bilirubin Urine NEGATIVE NEGATIVE   Ketones, ur NEGATIVE NEGATIVE mg/dL   Protein, ur 30 (A) NEGATIVE mg/dL   Nitrite NEGATIVE NEGATIVE   Leukocytes,Ua NEGATIVE NEGATIVE  Urinalysis, Microscopic (reflex)     Status: None   Collection Time: 09/01/22  2:23 AM  Result Value Ref Range   RBC / HPF NONE SEEN 0 - 5 RBC/hpf   WBC, UA 0-5 0 - 5 WBC/hpf   Bacteria, UA NONE SEEN NONE SEEN   Squamous Epithelial / HPF 0-5 0 - 5 /HPF   Imaging Studies: CT Thoracic Spine Wo Contrast  Result Date: 09/01/2022 CLINICAL DATA:  Mid back pain EXAM: CT THORACIC SPINE WITHOUT CONTRAST TECHNIQUE: Multidetector CT images of the thoracic were obtained using the standard protocol without intravenous contrast. RADIATION DOSE REDUCTION: This exam was performed according to the departmental dose-optimization program which includes automated exposure control, adjustment of the mA and/or kV according to patient size and/or use of iterative reconstruction technique. COMPARISON:  None Available. FINDINGS: Alignment: Normal. Vertebrae: No acute fracture or focal pathologic process. Paraspinal and other soft tissues: Intermediate sized hiatal hernia. Calcific aortic atherosclerosis. Disc levels: No spinal canal stenosis. IMPRESSION: 1. No acute fracture or static subluxation of the thoracic spine. 2. Intermediate sized hiatal hernia. Aortic Atherosclerosis (ICD10-I70.0). Electronically Signed   By: Deatra Robinson M.D.   On: 09/01/2022 03:30   CT Head Wo Contrast  Result Date: 09/01/2022 CLINICAL DATA:  Delirium. EXAM: CT HEAD WITHOUT CONTRAST TECHNIQUE: Contiguous axial images were obtained from the base of the skull through the vertex without intravenous contrast. RADIATION DOSE REDUCTION: This exam was performed according to the departmental dose-optimization program which includes automated exposure control, adjustment of  the mA and/or kV according to patient size and/or use of iterative reconstruction technique. COMPARISON:  Head CT dated 08/03/2014. FINDINGS: Evaluation of this exam is limited due to motion artifact. Brain: Mild age-related atrophy and moderate chronic microvascular ischemic changes. There is no acute intracranial hemorrhage. No mass effect or midline shift. No extra-axial fluid collection. Vascular: No hyperdense vessel or unexpected calcification. Skull: Normal. Negative for fracture or focal lesion. Sinuses/Orbits: No acute finding. Other: None IMPRESSION: 1. No acute intracranial pathology. 2. Mild age-related atrophy and moderate chronic microvascular ischemic changes. Electronically Signed   By: Elgie Collard M.D.   On: 09/01/2022 01:41   CT PELVIS WO CONTRAST  Result Date: 09/01/2022 CLINICAL DATA:  Hip trauma, fracture suspected, aching left leg pain EXAM: CT PELVIS WITHOUT CONTRAST TECHNIQUE: Multidetector CT imaging of the pelvis was performed following the standard protocol without intravenous contrast. RADIATION DOSE REDUCTION: This exam was performed according to the departmental dose-optimization program which includes automated exposure control, adjustment of the mA and/or kV according to patient size and/or use of iterative reconstruction technique. COMPARISON:  Pelvic radiographs 08/31/2022 and CT abdomen and pelvis 03/04/2012 FINDINGS: Urinary  Tract:  No abnormality visualized. Bowel:  Unremarkable visualized pelvic bowel loops. Vascular/Lymphatic: Aortic atherosclerotic calcification. No lymphadenopathy. Reproductive:  No mass or other significant abnormality Other:  None. Musculoskeletal: No acute fracture or dislocation. Degenerative changes pubic symphysis, both hips, SI joints and lower lumbar spine. Body wall edema. IMPRESSION: 1. No acute fracture or dislocation. Electronically Signed   By: Minerva Fester M.D.   On: 09/01/2022 00:39   DG FEMUR MIN 2 VIEWS LEFT  Result Date:  08/31/2022 CLINICAL DATA:  Aching left leg pain from hip to knee. Visible shortening of left leg. EXAM: PELVIS - 1-2 VIEW; LEFT FEMUR 2 VIEWS COMPARISON:  10/22/2020. FINDINGS: There is no evidence of pelvic fracture or diastasis. No acute fracture or dislocation at the left hip or knee. There are mild tricompartmental degenerative changes of the knee. No joint effusion at the knee. Vascular calcifications are noted in the soft tissues. Degenerative changes are noted in the lower lumbar spine. Surgical clips are noted in the inguinal region on the right and in the left lower extremity. IMPRESSION: No acute fracture or dislocation. Electronically Signed   By: Thornell Sartorius M.D.   On: 08/31/2022 23:56   DG Pelvis 1-2 Views  Result Date: 08/31/2022 CLINICAL DATA:  Aching left leg pain from hip to knee. Visible shortening of left leg. EXAM: PELVIS - 1-2 VIEW; LEFT FEMUR 2 VIEWS COMPARISON:  10/22/2020. FINDINGS: There is no evidence of pelvic fracture or diastasis. No acute fracture or dislocation at the left hip or knee. There are mild tricompartmental degenerative changes of the knee. No joint effusion at the knee. Vascular calcifications are noted in the soft tissues. Degenerative changes are noted in the lower lumbar spine. Surgical clips are noted in the inguinal region on the right and in the left lower extremity. IMPRESSION: No acute fracture or dislocation. Electronically Signed   By: Thornell Sartorius M.D.   On: 08/31/2022 23:56    ED COURSE and MDM  Nursing notes, initial and subsequent vitals signs, including pulse oximetry, reviewed and interpreted by myself.  Vitals:   09/01/22 0200 09/01/22 0300 09/01/22 0500 09/01/22 0600  BP: (!) 172/47 (!) 141/41 (!) 136/49 (!) 149/47  Pulse: 61 68 (!) 34 (!) 37  Resp: 18 18 (!) 21 19  Temp: 98.2 F (36.8 C)   98.1 F (36.7 C)  TempSrc:      SpO2: 99% 96% 95% 97%  Weight:      Height:       Medications  fentaNYL (SUBLIMAZE) injection 25 mcg (25 mcg  Intravenous Given 09/01/22 0439)  amLODipine (NORVASC) tablet 5 mg (has no administration in time range)  carvedilol (COREG) tablet 12.5 mg (has no administration in time range)  furosemide (LASIX) tablet 40 mg (has no administration in time range)  pantoprazole (PROTONIX) EC tablet 40 mg (has no administration in time range)  potassium chloride (KLOR-CON M) CR tablet 10 mEq (has no administration in time range)  fentaNYL (SUBLIMAZE) injection 25 mcg (25 mcg Intravenous Given 09/01/22 0109)    1:16 AM Additional information obtained from family member (girlfriend of grandson, who lives with the patient).  She states the patient is normally very active, ambulates well with a walker and likes to cook.  For about the past week and a half she has had a decline.  She is sleeping more, eating less, is more confused and less interested in activities such as cooking.  The onset of pain was fairly rapid yesterday evening.  She is  not aware of fall or injury but the patient is no longer able to bear weight on the left leg.  1:35 AM With moving patient to the CT table for a head CT (ordered due to altered mental status) she began complaining of mid back pain.  She is tender in the lower thoracic spine.  Will obtain a CT of the thoracic spine as well.  The patient's grandson acknowledges the patient has been complaining of back pain recently.   2:44 AM The cause of the patient's decline is unclear.  Her kidney function is not significantly changed from prior.  Her head CT does not show any acute changes.  I am concerned she may have an occult fracture of the left hip despite negative radiographs and CT.  Normally an MRI would be the next step but she has a pacemaker so she may require a bone scan.  3:12 AM Dr. Imogene Burn accepts for admission to hospitalist service at [facility].  He requests we consult orthopedic surgery.   3:24 AM Dr. August Saucer of orthopedics consulted. Someone from his practice will see her later  today.  PROCEDURES  Procedures CRITICAL CARE Performed by: Carlisle Beers Jariya Reichow Total critical care time: 30 minutes Critical care time was exclusive of separately billable procedures and treating other patients. Critical care was necessary to treat or prevent imminent or life-threatening deterioration. Critical care was time spent personally by me on the following activities: development of treatment plan with patient and/or surrogate as well as nursing, discussions with consultants, evaluation of patient's response to treatment, examination of patient, obtaining history from patient or surrogate, ordering and performing treatments and interventions, ordering and review of laboratory studies, ordering and review of radiographic studies, pulse oximetry and re-evaluation of patient's condition.   ED DIAGNOSES     ICD-10-CM   1. Failure to thrive in adult  R62.7     2. Acute pain of left hip  M25.552     3. Acute midline thoracic back pain  M54.6          Kaneesha Constantino, MD 09/01/22 0325    Paula Libra, MD 09/01/22 0341    Paula Libra, MD 09/01/22 [phone]

## 2022-08-31 NOTE — ED Triage Notes (Addendum)
PER EMS: pt is from home with c/o aching left leg pain from her hip down to her knee, onset tonight after walking to and from the restroom. Left leg and foot appears swollen with visible shortening. Denies injury/fall. She also reports left arm pain but this started a few days ago. GCS 15  BP- 153/60, HR-60, O2-100% RA

## 2022-09-01 ENCOUNTER — Emergency Department (HOSPITAL_BASED_OUTPATIENT_CLINIC_OR_DEPARTMENT_OTHER): Payer: Medicare Other

## 2022-09-01 ENCOUNTER — Encounter: Payer: Self-pay | Admitting: Licensed Clinical Social Worker

## 2022-09-01 ENCOUNTER — Other Ambulatory Visit: Payer: Self-pay

## 2022-09-01 ENCOUNTER — Inpatient Hospital Stay (HOSPITAL_COMMUNITY): Payer: Medicare Other

## 2022-09-01 DIAGNOSIS — R600 Localized edema: Secondary | ICD-10-CM | POA: Diagnosis not present

## 2022-09-01 DIAGNOSIS — M160 Bilateral primary osteoarthritis of hip: Secondary | ICD-10-CM | POA: Diagnosis not present

## 2022-09-01 DIAGNOSIS — S79919A Unspecified injury of unspecified hip, initial encounter: Secondary | ICD-10-CM | POA: Diagnosis not present

## 2022-09-01 DIAGNOSIS — Z743 Need for continuous supervision: Secondary | ICD-10-CM | POA: Diagnosis not present

## 2022-09-01 DIAGNOSIS — E1122 Type 2 diabetes mellitus with diabetic chronic kidney disease: Secondary | ICD-10-CM | POA: Diagnosis not present

## 2022-09-01 DIAGNOSIS — Z66 Do not resuscitate: Secondary | ICD-10-CM | POA: Diagnosis not present

## 2022-09-01 DIAGNOSIS — M545 Low back pain, unspecified: Secondary | ICD-10-CM | POA: Diagnosis not present

## 2022-09-01 DIAGNOSIS — R251 Tremor, unspecified: Secondary | ICD-10-CM | POA: Diagnosis not present

## 2022-09-01 DIAGNOSIS — M1009 Idiopathic gout, multiple sites: Secondary | ICD-10-CM | POA: Diagnosis not present

## 2022-09-01 DIAGNOSIS — M6281 Muscle weakness (generalized): Secondary | ICD-10-CM | POA: Diagnosis not present

## 2022-09-01 DIAGNOSIS — I2510 Atherosclerotic heart disease of native coronary artery without angina pectoris: Secondary | ICD-10-CM | POA: Diagnosis not present

## 2022-09-01 DIAGNOSIS — I252 Old myocardial infarction: Secondary | ICD-10-CM | POA: Diagnosis not present

## 2022-09-01 DIAGNOSIS — E1151 Type 2 diabetes mellitus with diabetic peripheral angiopathy without gangrene: Secondary | ICD-10-CM | POA: Diagnosis not present

## 2022-09-01 DIAGNOSIS — R2681 Unsteadiness on feet: Secondary | ICD-10-CM | POA: Diagnosis not present

## 2022-09-01 DIAGNOSIS — I5030 Unspecified diastolic (congestive) heart failure: Secondary | ICD-10-CM | POA: Diagnosis not present

## 2022-09-01 DIAGNOSIS — R531 Weakness: Secondary | ICD-10-CM | POA: Diagnosis not present

## 2022-09-01 DIAGNOSIS — Z950 Presence of cardiac pacemaker: Secondary | ICD-10-CM | POA: Diagnosis not present

## 2022-09-01 DIAGNOSIS — R2689 Other abnormalities of gait and mobility: Secondary | ICD-10-CM | POA: Diagnosis not present

## 2022-09-01 DIAGNOSIS — Z794 Long term (current) use of insulin: Secondary | ICD-10-CM | POA: Diagnosis not present

## 2022-09-01 DIAGNOSIS — I129 Hypertensive chronic kidney disease with stage 1 through stage 4 chronic kidney disease, or unspecified chronic kidney disease: Secondary | ICD-10-CM | POA: Diagnosis not present

## 2022-09-01 DIAGNOSIS — E1143 Type 2 diabetes mellitus with diabetic autonomic (poly)neuropathy: Secondary | ICD-10-CM | POA: Diagnosis not present

## 2022-09-01 DIAGNOSIS — M25552 Pain in left hip: Secondary | ICD-10-CM | POA: Diagnosis not present

## 2022-09-01 DIAGNOSIS — N1832 Chronic kidney disease, stage 3b: Secondary | ICD-10-CM | POA: Diagnosis not present

## 2022-09-01 DIAGNOSIS — M546 Pain in thoracic spine: Secondary | ICD-10-CM | POA: Diagnosis not present

## 2022-09-01 DIAGNOSIS — Z7401 Bed confinement status: Secondary | ICD-10-CM | POA: Diagnosis not present

## 2022-09-01 DIAGNOSIS — I5032 Chronic diastolic (congestive) heart failure: Secondary | ICD-10-CM | POA: Diagnosis not present

## 2022-09-01 DIAGNOSIS — Z951 Presence of aortocoronary bypass graft: Secondary | ICD-10-CM | POA: Diagnosis not present

## 2022-09-01 DIAGNOSIS — Z955 Presence of coronary angioplasty implant and graft: Secondary | ICD-10-CM | POA: Diagnosis not present

## 2022-09-01 DIAGNOSIS — M48061 Spinal stenosis, lumbar region without neurogenic claudication: Secondary | ICD-10-CM | POA: Diagnosis not present

## 2022-09-01 DIAGNOSIS — M25562 Pain in left knee: Secondary | ICD-10-CM | POA: Diagnosis not present

## 2022-09-01 DIAGNOSIS — F32A Depression, unspecified: Secondary | ICD-10-CM | POA: Diagnosis not present

## 2022-09-01 DIAGNOSIS — E039 Hypothyroidism, unspecified: Secondary | ICD-10-CM | POA: Diagnosis not present

## 2022-09-01 DIAGNOSIS — Z87891 Personal history of nicotine dependence: Secondary | ICD-10-CM | POA: Diagnosis not present

## 2022-09-01 DIAGNOSIS — R410 Disorientation, unspecified: Secondary | ICD-10-CM | POA: Diagnosis not present

## 2022-09-01 DIAGNOSIS — Z8249 Family history of ischemic heart disease and other diseases of the circulatory system: Secondary | ICD-10-CM | POA: Diagnosis not present

## 2022-09-01 DIAGNOSIS — E785 Hyperlipidemia, unspecified: Secondary | ICD-10-CM | POA: Diagnosis present

## 2022-09-01 DIAGNOSIS — M1712 Unilateral primary osteoarthritis, left knee: Secondary | ICD-10-CM | POA: Diagnosis not present

## 2022-09-01 DIAGNOSIS — K3184 Gastroparesis: Secondary | ICD-10-CM | POA: Diagnosis present

## 2022-09-01 DIAGNOSIS — D631 Anemia in chronic kidney disease: Secondary | ICD-10-CM | POA: Diagnosis not present

## 2022-09-01 DIAGNOSIS — I130 Hypertensive heart and chronic kidney disease with heart failure and stage 1 through stage 4 chronic kidney disease, or unspecified chronic kidney disease: Secondary | ICD-10-CM | POA: Diagnosis not present

## 2022-09-01 DIAGNOSIS — R627 Adult failure to thrive: Secondary | ICD-10-CM | POA: Diagnosis present

## 2022-09-01 DIAGNOSIS — E11620 Type 2 diabetes mellitus with diabetic dermatitis: Secondary | ICD-10-CM | POA: Diagnosis not present

## 2022-09-01 DIAGNOSIS — M25462 Effusion, left knee: Secondary | ICD-10-CM | POA: Diagnosis not present

## 2022-09-01 DIAGNOSIS — I495 Sick sinus syndrome: Secondary | ICD-10-CM | POA: Diagnosis not present

## 2022-09-01 LAB — URINALYSIS, ROUTINE W REFLEX MICROSCOPIC
Bilirubin Urine: NEGATIVE
Glucose, UA: NEGATIVE mg/dL
Hgb urine dipstick: NEGATIVE
Ketones, ur: NEGATIVE mg/dL
Leukocytes,Ua: NEGATIVE
Nitrite: NEGATIVE
Protein, ur: 30 mg/dL — AB
Specific Gravity, Urine: 1.015 (ref 1.005–1.030)
pH: 6.5 (ref 5.0–8.0)

## 2022-09-01 LAB — URINALYSIS, MICROSCOPIC (REFLEX)
Bacteria, UA: NONE SEEN
RBC / HPF: NONE SEEN RBC/hpf (ref 0–5)

## 2022-09-01 LAB — CBC WITH DIFFERENTIAL/PLATELET
Abs Immature Granulocytes: 0.04 10*3/uL (ref 0.00–0.07)
Basophils Absolute: 0 10*3/uL (ref 0.0–0.1)
Basophils Relative: 0 %
Eosinophils Absolute: 0.3 10*3/uL (ref 0.0–0.5)
Eosinophils Relative: 2 %
HCT: 37.7 % (ref 36.0–46.0)
Hemoglobin: 12.3 g/dL (ref 12.0–15.0)
Immature Granulocytes: 0 %
Lymphocytes Relative: 18 %
Lymphs Abs: 1.9 10*3/uL (ref 0.7–4.0)
MCH: 31.5 pg (ref 26.0–34.0)
MCHC: 32.6 g/dL (ref 30.0–36.0)
MCV: 96.4 fL (ref 80.0–100.0)
Monocytes Absolute: 0.9 10*3/uL (ref 0.1–1.0)
Monocytes Relative: 9 %
Neutro Abs: 7.3 10*3/uL (ref 1.7–7.7)
Neutrophils Relative %: 71 %
Platelets: 170 10*3/uL (ref 150–400)
RBC: 3.91 MIL/uL (ref 3.87–5.11)
RDW: 12.8 % (ref 11.5–15.5)
WBC: 10.4 10*3/uL (ref 4.0–10.5)
nRBC: 0 % (ref 0.0–0.2)

## 2022-09-01 LAB — BASIC METABOLIC PANEL
Anion gap: 10 (ref 5–15)
BUN: 34 mg/dL — ABNORMAL HIGH (ref 8–23)
CO2: 24 mmol/L (ref 22–32)
Calcium: 8.6 mg/dL — ABNORMAL LOW (ref 8.9–10.3)
Chloride: 100 mmol/L (ref 98–111)
Creatinine, Ser: 1.38 mg/dL — ABNORMAL HIGH (ref 0.44–1.00)
GFR, Estimated: 37 mL/min — ABNORMAL LOW (ref >60)
Glucose, Bld: 163 mg/dL — ABNORMAL HIGH (ref 70–99)
Potassium: 3.9 mmol/L (ref 3.5–5.1)
Sodium: 134 mmol/L — ABNORMAL LOW (ref 135–145)

## 2022-09-01 LAB — GLUCOSE, CAPILLARY
Glucose-Capillary: 159 mg/dL — ABNORMAL HIGH (ref 70–99)
Glucose-Capillary: 161 mg/dL — ABNORMAL HIGH (ref 70–99)
Glucose-Capillary: 161 mg/dL — ABNORMAL HIGH (ref 70–99)

## 2022-09-01 MED ORDER — ONDANSETRON HCL 4 MG/2ML IJ SOLN
4.0000 mg | Freq: Four times a day (QID) | INTRAMUSCULAR | Status: DC | PRN
  Administered 2022-09-01: 4 mg via INTRAVENOUS
  Filled 2022-09-01: qty 2

## 2022-09-01 MED ORDER — INSULIN ASPART 100 UNIT/ML IJ SOLN
0.0000 [IU] | Freq: Three times a day (TID) | INTRAMUSCULAR | Status: DC
  Administered 2022-09-01 – 2022-09-02 (×3): 3 [IU] via SUBCUTANEOUS
  Administered 2022-09-02 – 2022-09-03 (×2): 2 [IU] via SUBCUTANEOUS
  Administered 2022-09-03 (×2): 3 [IU] via SUBCUTANEOUS
  Administered 2022-09-04: 2 [IU] via SUBCUTANEOUS
  Administered 2022-09-04: 5 [IU] via SUBCUTANEOUS
  Administered 2022-09-05 – 2022-09-06 (×2): 2 [IU] via SUBCUTANEOUS
  Administered 2022-09-06: 3 [IU] via SUBCUTANEOUS
  Administered 2022-09-06: 2 [IU] via SUBCUTANEOUS
  Administered 2022-09-07: 3 [IU] via SUBCUTANEOUS

## 2022-09-01 MED ORDER — ACETAMINOPHEN 325 MG PO TABS
650.0000 mg | ORAL_TABLET | Freq: Four times a day (QID) | ORAL | Status: DC | PRN
  Administered 2022-09-01 – 2022-09-06 (×14): 650 mg via ORAL
  Filled 2022-09-01 (×14): qty 2

## 2022-09-01 MED ORDER — TRAZODONE HCL 50 MG PO TABS
25.0000 mg | ORAL_TABLET | Freq: Every evening | ORAL | Status: DC | PRN
  Administered 2022-09-05 – 2022-09-06 (×2): 25 mg via ORAL
  Filled 2022-09-01 (×2): qty 1

## 2022-09-01 MED ORDER — ONDANSETRON HCL 4 MG PO TABS: 4.0000 mg | ORAL_TABLET | Freq: Four times a day (QID) | ORAL | Status: DC | PRN

## 2022-09-01 MED ORDER — INSULIN ASPART 100 UNIT/ML IJ SOLN: 0.0000 [IU] | Freq: Every day | INTRAMUSCULAR | Status: DC

## 2022-09-01 MED ORDER — CARVEDILOL 12.5 MG PO TABS
12.5000 mg | ORAL_TABLET | Freq: Two times a day (BID) | ORAL | Status: DC
  Administered 2022-09-01 – 2022-09-07 (×13): 12.5 mg via ORAL
  Filled 2022-09-01 (×13): qty 1

## 2022-09-01 MED ORDER — CLOPIDOGREL BISULFATE 75 MG PO TABS
75.0000 mg | ORAL_TABLET | Freq: Every day | ORAL | Status: DC
  Administered 2022-09-01 – 2022-09-07 (×7): 75 mg via ORAL
  Filled 2022-09-01 (×7): qty 1

## 2022-09-01 MED ORDER — POTASSIUM CHLORIDE CRYS ER 10 MEQ PO TBCR
10.0000 meq | EXTENDED_RELEASE_TABLET | Freq: Every day | ORAL | Status: DC
  Administered 2022-09-01 – 2022-09-07 (×7): 10 meq via ORAL
  Filled 2022-09-01 (×8): qty 1

## 2022-09-01 MED ORDER — FENTANYL CITRATE PF 50 MCG/ML IJ SOSY
25.0000 ug | PREFILLED_SYRINGE | Freq: Once | INTRAMUSCULAR | Status: AC
  Administered 2022-09-01: 25 ug via INTRAVENOUS
  Filled 2022-09-01: qty 1

## 2022-09-01 MED ORDER — AMLODIPINE BESYLATE 5 MG PO TABS
5.0000 mg | ORAL_TABLET | Freq: Two times a day (BID) | ORAL | Status: DC
  Administered 2022-09-01 – 2022-09-07 (×13): 5 mg via ORAL
  Filled 2022-09-01 (×13): qty 1

## 2022-09-01 MED ORDER — OXYCODONE HCL 5 MG PO TABS
5.0000 mg | ORAL_TABLET | ORAL | Status: DC | PRN
  Filled 2022-09-01: qty 1

## 2022-09-01 MED ORDER — PANTOPRAZOLE SODIUM 40 MG PO TBEC
40.0000 mg | DELAYED_RELEASE_TABLET | Freq: Every day | ORAL | Status: DC
  Administered 2022-09-01 – 2022-09-07 (×7): 40 mg via ORAL
  Filled 2022-09-01 (×7): qty 1

## 2022-09-01 MED ORDER — FENTANYL CITRATE PF 50 MCG/ML IJ SOSY: 25.0000 ug | PREFILLED_SYRINGE | Freq: Once | INTRAMUSCULAR | Status: DC | PRN

## 2022-09-01 MED ORDER — ALBUTEROL SULFATE (2.5 MG/3ML) 0.083% IN NEBU: 2.5000 mg | INHALATION_SOLUTION | RESPIRATORY_TRACT | Status: DC | PRN

## 2022-09-01 MED ORDER — FUROSEMIDE 40 MG PO TABS
40.0000 mg | ORAL_TABLET | Freq: Every day | ORAL | Status: DC
  Administered 2022-09-01 – 2022-09-07 (×7): 40 mg via ORAL
  Filled 2022-09-01 (×7): qty 1

## 2022-09-01 MED ORDER — ENOXAPARIN SODIUM 40 MG/0.4ML IJ SOSY
40.0000 mg | PREFILLED_SYRINGE | INTRAMUSCULAR | Status: DC
  Administered 2022-09-01 – 2022-09-05 (×5): 40 mg via SUBCUTANEOUS
  Filled 2022-09-01 (×5): qty 0.4

## 2022-09-01 MED ORDER — HYDRALAZINE HCL 20 MG/ML IJ SOLN: 5.0000 mg | Freq: Four times a day (QID) | INTRAMUSCULAR | Status: DC | PRN

## 2022-09-01 MED ORDER — FENTANYL CITRATE PF 50 MCG/ML IJ SOSY: 50.0000 ug | PREFILLED_SYRINGE | Freq: Once | INTRAMUSCULAR | Status: DC | PRN

## 2022-09-01 MED ORDER — ACETAMINOPHEN 650 MG RE SUPP: 650.0000 mg | Freq: Four times a day (QID) | RECTAL | Status: DC | PRN

## 2022-09-01 MED ORDER — HYDROMORPHONE HCL 1 MG/ML IJ SOLN
1.0000 mg | INTRAMUSCULAR | Status: DC | PRN
  Administered 2022-09-01: 1 mg via INTRAVENOUS
  Filled 2022-09-01: qty 1

## 2022-09-01 MED ORDER — FENTANYL CITRATE PF 50 MCG/ML IJ SOSY
25.0000 ug | PREFILLED_SYRINGE | INTRAMUSCULAR | Status: DC | PRN
  Administered 2022-09-01 (×2): 25 ug via INTRAVENOUS
  Filled 2022-09-01 (×2): qty 1

## 2022-09-01 NOTE — H&P (Signed)
History and Physical  [name]:[id number] DOB: 02-Oct-1932 DOA: 08/31/2022  PCP: Donato Schultz, DO   Chief Complaint: Left hip pain  [name] is a 89 y.o. female with medical history significant for CAD, chronic diastolic congestive heart failure, GERD being admitted to the hospital for evaluation of acute left hip pain.  History is provided by the patient as well as her daughter and grandson who are at the bedside, and with whom she lives.  They tell me that at baseline she ambulates with a walker at home she is quite active, cooking, cleaning etc.  For the last 5 to 6 days, they say she has not been herself, she has actually been complaining of some arm pain, and yesterday she had sudden onset of left hip pain.  She has not had a fall for the last 5 or 6 years at least, there is no mention of trauma recently either.  Says that yesterday she went to get up from a chair, and had sudden left hip pain.  She went for evaluation to the ER [facility], where imaging did not reveal any fracture.  She still has severe pain unable to ambulate.  ER provider discussed with orthopedic surgery, who recommended hospitalist admission, and they will evaluate regarding further workup.  ED Course: Evaluation in the ER revealed significant hypertension, but vital signs otherwise stable, saturating well on room air.  Lab work was done which shows stable CKD, and unremarkable BMP and CBC.  Review of Systems: Please see HPI for pertinent positives and negatives. A complete 10 system review of systems are otherwise negative.  Past Medical History:  Diagnosis Date   Allergic rhinitis    Anemia    Anxiety    Barrett esophagus    CAD (coronary artery disease) 2009   a. Multivessel s/p PCI w/DES 2009 // b. s/p CABG 2011  //  c. LHC 8/15: pLAD 95 ISR, LCx 100, pOM1 40, dRCA 100, S-OM1/OM2 ok, S-D1 ok, S-PDA ok, L-LAD ok, EF 60%   Carotid artery disease (HCC)    a. Carotid US 9/15: RICA  1-39%; LICA 40-59% >> FU 1 year  //  b. Carotid US 9/17: R 1-39%, L 40-59% >> FU 1 year   Chronic diastolic heart failure (HCC)    CKD (chronic kidney disease), stage II    GFR 60-89 ml/min   Depression    Disc disease, degenerative, cervical    Diverticulosis    Gastroparesis    GERD (gastroesophageal reflux disease)    Gout    H/O hiatal hernia    Helicobacter pylori gastritis    History of echocardiogram    a. Echo 11/13: EF 55% to 60%. Grade 2 diastolic dysfunction, MAC, trivial MR, mild LAE, normal RVSF, mild RAE, PASP 39 mmHg  //  b. 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   Type II or unspecified type diabetes mellitus without mention of complication, not stated as  uncontrolled    Past Surgical History:  Procedure Laterality Date   ABDOMINAL HYSTERECTOMY     CARDIAC CATHETERIZATION     2011  DR COOPER (APPT NEXT WEEK)   CHOLECYSTECTOMY     CORONARY ARTERY BYPASS GRAFT  2011   LIMA-LAD, SVG-DIAG, SVG-OM1-OM2, SVG-PDA   CORONARY STENT PLACEMENT     Drug-eluting stent to the left anterior descending, circumflex and right coronary artery in Jan 2009   EYE SURGERY     BIL CATARACT REMOVAL 06/2010   LEFT HEART CATHETERIZATION WITH CORONARY ANGIOGRAM N/A 12/02/2013   Procedure: LEFT HEART CATHETERIZATION WITH CORONARY ANGIOGRAM;  Surgeon: Lesleigh Noe, MD;  Location: [facility] CATH LAB;  Service: Cardiovascular;  Laterality: N/A;   OVARIAN CYST REMOVAL     PACEMAKER INSERTION  03/06/12   MDT Adapta L implanted by Dr Johney Frame for SSS   PERMANENT  PACEMAKER INSERTION N/A 03/06/2012   Procedure: PERMANENT PACEMAKER INSERTION;  Surgeon: Hillis Range, MD;  Location: [facility] CATH LAB;  Service: Cardiovascular;  Laterality: N/A;   SHOULDER ARTHROSCOPY  06/16/2011   Procedure: ARTHROSCOPY SHOULDER;  Surgeon: Kennieth Rad, MD;  Location: [facility] OR;  Service: Orthopedics;  Laterality: Left;  LEFT SHOULDER ARTHROSCOPY ACROMIALPLASTY, POSSIBLE MINI OPEN CUFF REPAIR    TUBAL LIGATION      Social History:  reports that she has quit smoking. She has never used smokeless tobacco. She reports that she does not drink alcohol and does not use drugs.   Allergies  Allergen Reactions   Ciprofloxacin Nausea And Vomiting and Other (See Comments)    syncope   Codeine Other (See Comments)    HALLUCINATIONS   Hydrocodone Other (See Comments)    Makes her pass out   Penicillins Hives    Has patient had a PCN reaction causing immediate rash, facial/tongue/throat swelling, SOB or lightheadedness with hypotension: No Has patient had a PCN reaction causing severe rash involving mucus membranes or skin necrosis: Yes Has patient had a PCN reaction that required hospitalization: in the hospital at time of reaction Has patient had a PCN reaction occurring within the last 10 years: Unknown If all of the above answers are "NO", then may proceed with Cephalosporin use.   Shellfish Allergy Hives   Diltiazem Hcl Other (See Comments)    : low heart rate   Sulfonamide Derivatives Nausea And Vomiting   Morphine And Codeine Other (See Comments)    Went crazy   Lovastatin Other (See Comments)    Unknown reaction   Metformin Diarrhea    Family History  Problem Relation Age of Onset   Diabetes Mother    Hypertension Mother    Heart attack Mother    Stroke Father    Parkinson's disease Brother    Coronary artery disease Other      Prior to Admission medications   Medication Sig Start Date End Date Taking? Authorizing Provider  acetaminophen (TYLENOL) 500 MG tablet  Take 500 mg by mouth every 6 (six) hours as needed for headache (pain).    [provider]  allopurinol (ZYLOPRIM) 100 MG tablet Take 100 mg by mouth at bedtime.     [provider]  amLODipine (NORVASC) 5 MG tablet Take 5 mg by mouth 2 (two) times daily.    [provider]  atorvastatin (LIPITOR) 20 MG tablet TAKE 1 TABLET BY MOUTH ONCE DAILY 09/07/21   Tonny Bollman, MD  azelastine (OPTIVAR) 0.05 % ophthalmic solution Place 1 drop into both eyes 2 (two) times  daily. 10/26/15   [provider]  carvedilol (COREG) 12.5 MG tablet TAKE 1 TABLET BY MOUTH TWICE  DAILY 03/07/22   Tonny Bollman, MD  clopidogrel (PLAVIX) 75 MG tablet TAKE 1 TABLET BY MOUTH  DAILY 08/15/22   Tereso Newcomer T, PA-C  dicyclomine (BENTYL) 10 MG capsule Take 1 capsule (10 mg total) by mouth 4 (four) times daily -  before meals and at bedtime. 07/05/22   [name], Yvonne R, DO  ENTRESTO 97-103 MG TAKE 1 TABLET BY MOUTH TWICE  DAILY 01/27/22   Tonny Bollman, MD  fidaxomicin (DIFICID) 200 MG TABS tablet Take 1 tablet (200 mg total) by mouth 2 (two) times daily. 07/09/22   Donato Schultz, DO  furosemide (LASIX) 40 MG tablet Take 1 tablet (40 mg total) by mouth daily. 05/10/22   Tereso Newcomer T, PA-C  Insulin NPH, Human,, Isophane, (HUMULIN N KWIKPEN) 100 UNIT/ML Kiwkpen Per sliding scale -- no more than 40 u a day 05/27/22   [name], Grayling Congress, DO  Insulin Pen Needle (BD PEN NEEDLE MICRO U/F) 32G X 6 MM MISC To inject Humalin 06/01/22   [name], Grayling Congress, DO  Multiple Vitamin (MULTIVITAMIN WITH MINERALS) TABS tablet Take 1 tablet by mouth at bedtime.     [provider]  nitroGLYCERIN (NITROSTAT) 0.4 MG SL tablet DISSOLVE ONE TABLET UNDER THE TONGUE EVERY 5 MINUTES AS NEEDED FOR CHEST PAIN.  DO NOT EXCEED A TOTAL OF 3 DOSES IN 15 MINUTES Patient not taking: Reported on 08/15/2022 02/27/20   Marily Lente, NP  omeprazole (PRILOSEC) 20 MG capsule Take 1 capsule (20 mg total) by  mouth daily. 07/05/22   Seabron Spates R, DO  ONE TOUCH ULTRA TEST test strip 1 each by Other route daily as needed (blood sugar).  07/10/13   [provider]  potassium chloride (KLOR-CON) 10 MEQ tablet Take 1 tablet (10 mEq total) by mouth daily. 07/12/22   Tereso Newcomer T, PA-C  tobramycin (TOBREX) 0.3 % ophthalmic solution Place 1 drop into the left eye 4 (four) times daily. 06/13/20   [provider]  triamcinolone cream (KENALOG) 0.1 % Apply 1 Application topically 2 (two) times daily. 03/31/22   Donato Schultz, DO    Physical Exam: BP (!) 189/57 (BP Location: Left Arm)   Pulse 60   Temp 98.9 F (37.2 C) (Oral)   Resp 14   Ht 5\' 7"  (1.702 m)   Wt 83 kg   SpO2 97%   BMI 28.66 kg/m   General:  Alert, oriented, calm, in no acute distress  Eyes: EOMI, clear conjuctivae, white sclerea Neck: supple, no masses, trachea mildline  Cardiovascular: RRR, no murmurs or rubs, no peripheral edema  Respiratory: clear to auscultation bilaterally, no wheezes, no crackles  Abdomen: soft, nontender, nondistended, normal bowel tones heard  Skin: dry, no rashes  Musculoskeletal: no joint effusions, normal range of motion  Psychiatric: appropriate affect, normal speech  Neurologic: extraocular muscles intact, clear speech, moving all extremities with intact sensorium          Labs on Admission:  Basic Metabolic Panel: Recent Labs  Lab 09/01/22 0106  NA 134*  K 3.9  CL 100  CO2 24  GLUCOSE 163*  BUN 34*  CREATININE 1.38*  CALCIUM 8.6*   Liver Function Tests: No results for input(s): "AST", "ALT", "ALKPHOS", "BILITOT", "PROT", "ALBUMIN" in the last 168 hours. No results for input(s): "LIPASE", "AMYLASE" in the last 168 hours. No  results for input(s): "AMMONIA" in the last 168 hours. CBC: Recent Labs  Lab 09/01/22 0106  WBC 10.4  NEUTROABS 7.3  HGB 12.3  HCT 37.7  MCV 96.4  PLT 170   Cardiac Enzymes: No results for input(s): "CKTOTAL", "CKMB",  "CKMBINDEX", "TROPONINI" in the last 168 hours.  BNP (last 3 results) No results for input(s): "BNP" in the last 8760 hours.  ProBNP (last 3 results) No results for input(s): "PROBNP" in the last 8760 hours.  CBG: No results for input(s): "GLUCAP" in the last 168 hours.  Radiological Exams on Admission: CT Thoracic Spine Wo Contrast  Result Date: 09/01/2022 CLINICAL DATA:  Mid back pain EXAM: CT THORACIC SPINE WITHOUT CONTRAST TECHNIQUE: Multidetector CT images of the thoracic were obtained using the standard protocol without intravenous contrast. RADIATION DOSE REDUCTION: This exam was performed according to the departmental dose-optimization program which includes automated exposure control, adjustment of the mA and/or kV according to patient size and/or use of iterative reconstruction technique. COMPARISON:  None Available. FINDINGS: Alignment: Normal. Vertebrae: No acute fracture or focal pathologic process. Paraspinal and other soft tissues: Intermediate sized hiatal hernia. Calcific aortic atherosclerosis. Disc levels: No spinal canal stenosis. IMPRESSION: 1. No acute fracture or static subluxation of the thoracic spine. 2. Intermediate sized hiatal hernia. Aortic Atherosclerosis (ICD10-I70.0). Electronically Signed   By: Deatra Robinson M.D.   On: 09/01/2022 03:30   CT Head Wo Contrast  Result Date: 09/01/2022 CLINICAL DATA:  Delirium. EXAM: CT HEAD WITHOUT CONTRAST TECHNIQUE: Contiguous axial images were obtained from the base of the skull through the vertex without intravenous contrast. RADIATION DOSE REDUCTION: This exam was performed according to the departmental dose-optimization program which includes automated exposure control, adjustment of the mA and/or kV according to patient size and/or use of iterative reconstruction technique. COMPARISON:  Head CT dated 08/03/2014. FINDINGS: Evaluation of this exam is limited due to motion artifact. Brain: Mild age-related atrophy and moderate  chronic microvascular ischemic changes. There is no acute intracranial hemorrhage. No mass effect or midline shift. No extra-axial fluid collection. Vascular: No hyperdense vessel or unexpected calcification. Skull: Normal. Negative for fracture or focal lesion. Sinuses/Orbits: No acute finding. Other: None IMPRESSION: 1. No acute intracranial pathology. 2. Mild age-related atrophy and moderate chronic microvascular ischemic changes. Electronically Signed   By: Elgie Collard M.D.   On: 09/01/2022 01:41   CT PELVIS WO CONTRAST  Result Date: 09/01/2022 CLINICAL DATA:  Hip trauma, fracture suspected, aching left leg pain EXAM: CT PELVIS WITHOUT CONTRAST TECHNIQUE: Multidetector CT imaging of the pelvis was performed following the standard protocol without intravenous contrast. RADIATION DOSE REDUCTION: This exam was performed according to the departmental dose-optimization program which includes automated exposure control, adjustment of the mA and/or kV according to patient size and/or use of iterative reconstruction technique. COMPARISON:  Pelvic radiographs 08/31/2022 and CT abdomen and pelvis 03/04/2012 FINDINGS: Urinary Tract:  No abnormality visualized. Bowel:  Unremarkable visualized pelvic bowel loops. Vascular/Lymphatic: Aortic atherosclerotic calcification. No lymphadenopathy. Reproductive:  No mass or other significant abnormality Other:  None. Musculoskeletal: No acute fracture or dislocation. Degenerative changes pubic symphysis, both hips, SI joints and lower lumbar spine. Body wall edema. IMPRESSION: 1. No acute fracture or dislocation. Electronically Signed   By: Minerva Fester M.D.   On: 09/01/2022 00:39   DG FEMUR MIN 2 VIEWS LEFT  Result Date: 08/31/2022 CLINICAL DATA:  Aching left leg pain from hip to knee. Visible shortening of left leg. EXAM: PELVIS - 1-2 VIEW; LEFT FEMUR 2 VIEWS  COMPARISON:  10/22/2020. FINDINGS: There is no evidence of pelvic fracture or diastasis. No acute fracture  or dislocation at the left hip or knee. There are mild tricompartmental degenerative changes of the knee. No joint effusion at the knee. Vascular calcifications are noted in the soft tissues. Degenerative changes are noted in the lower lumbar spine. Surgical clips are noted in the inguinal region on the right and in the left lower extremity. IMPRESSION: No acute fracture or dislocation. Electronically Signed   By: Thornell Sartorius M.D.   On: 08/31/2022 23:56   DG Pelvis 1-2 Views  Result Date: 08/31/2022 CLINICAL DATA:  Aching left leg pain from hip to knee. Visible shortening of left leg. EXAM: PELVIS - 1-2 VIEW; LEFT FEMUR 2 VIEWS COMPARISON:  10/22/2020. FINDINGS: There is no evidence of pelvic fracture or diastasis. No acute fracture or dislocation at the left hip or knee. There are mild tricompartmental degenerative changes of the knee. No joint effusion at the knee. Vascular calcifications are noted in the soft tissues. Degenerative changes are noted in the lower lumbar spine. Surgical clips are noted in the inguinal region on the right and in the left lower extremity. IMPRESSION: No acute fracture or dislocation. Electronically Signed   By: Thornell Sartorius M.D.   On: 08/31/2022 23:56    Assessment/Plan This is a pleasant 89 year old female with a history of type 2 diabetes, hypertension, CKD stage III being admitted to the hospital with acute onset left hip pain.  Workup so far is unrevealing for etiology of her pain.  Orthopedic surgery has been consulted for further evaluation and management. -Observation admission to MedSurg -Pain and nausea control as needed -Discussed with orthopedic surgery Dr. August Saucer who plans to evaluate the patient later this afternoon  CAD-continue antihypertensive, Lasix, Plavix, Coreg    Essential hypertension-continue home amlodipine, Coreg, with as needed IV hydralazine    CKD (chronic kidney disease), stage III (HCC)-renal function appears to be at baseline    Type 2  diabetes mellitus with diabetic dermatitis, without long-term current use of insulin (HCC)-diabetic diet when eating, with sliding scale.  DVT prophylaxis: Lovenox     Code Status: DNR, confirmed with the patient and family admission  Consults called: Orthopedic surgery  Admission status: Observation  Time spent: 99 minutes  Maury Groninger Sharlette Dense MD [facility] Pager [phone]  If 7PM-7AM, please contact night-coverage [email or web address] Password [facility]  09/01/2022, 10:23 AM

## 2022-09-01 NOTE — Progress Notes (Signed)
   Patient Name: [name], yong DOB: 1932/12/31 ZOX:[id number] Transferring facility: [facility] Requesting provider: molpus Reason for transfer: left hip pain 89 yo WF with mental status change and worsening left pain. EDP suspects hip fracture. CT pelvis negative. pt has non-MRI compatible pacemaker. EDP will talk with ortho. EDP thinks pt will need bonescan to diagnose hip fracture. bonescan not available at [facility] Going to:[facility] Admission Status: observation Bed Type: med/surg To Do: have ortho see patient in AM to decide on how to proceed with diagnosis   [facility] will assume care on arrival to accepting facility. Until arrival, medical decision making responsibilities remain with the EDP.  However, [facility] available 24/7 for questions and assistance.   Nursing staff please page [facility] Admits and Consults [phone]) as soon as the patient arrives to the hospital.  Carollee Herter, DO [facility]

## 2022-09-01 NOTE — ED Notes (Signed)
Patients robe and life alert placed in belongings bag and sent with [facility]

## 2022-09-01 NOTE — Consult Note (Signed)
Reason for Consult:left hip pain Referring Physician: Dr Atlee Abide is an 89 y.o. female.  [name] is an 89 year old patient admitted to the hospital for left hip pain and inability to ambulate.  Describes atraumatic onset of left hip pain several days ago.  Localizes pain to the groin and anterior thigh region.  Denies any fevers or chills.  She does have a history of lumbar stenosis.  Plain radiographs and CT scan of the pelvis unremarkable.  Radiographs left femur unremarkable.  CT scan of the thoracic spine also unremarkable for any acute injury.  Patient has a permanent pacemaker and cannot have MRI scanning.  Does report a history of gout.  Past Medical History:  Diagnosis Date   Allergic rhinitis    Anemia    Anxiety    Barrett esophagus    CAD (coronary artery disease) 2009   a. Multivessel s/p PCI w/DES 2009 // b. s/p CABG 2011  //  c. LHC 8/15: pLAD 95 ISR, LCx 100, pOM1 40, dRCA 100, S-OM1/OM2 ok, S-D1 ok, S-PDA ok, L-LAD ok, EF 60%   Carotid artery disease (HCC)    a. Carotid US 9/15: RICA 1-39%; LICA 40-59% >> FU 1 year  //  b. Carotid US 9/17: R 1-39%, L 40-59% >> FU 1 year   Chronic diastolic heart failure (HCC)    CKD (chronic kidney disease), stage II    GFR 60-89 ml/min   Depression    Disc disease, degenerative, cervical    Diverticulosis    Gastroparesis    GERD (gastroesophageal reflux disease)    Gout    H/O hiatal hernia    Helicobacter pylori gastritis    History of echocardiogram    a. Echo 11/13: EF 55% to 60%. Grade 2 diastolic dysfunction, MAC, trivial MR, mild LAE, normal RVSF, mild RAE, PASP 39 mmHg  //  b. Echo 4/17: EF 55-60%, normal wall motion, trivial AI, MAC, moderate LAE, mild RVE, PASP 35 mmHg   History of thrombocytopenia    HTN (hypertension)    Hyperlipidemia    Hypothyroidism    LBP (low back pain)    Lumbar disc disease/lumbar spinal stenosis   Macular degeneration    Morbid obesity (HCC)    Myocardial infarction  (HCC)    Osteoarthritis    Osteopenia    PVD (peripheral vascular disease) (HCC)    Sick sinus syndrome (HCC)    MDT Dual-chamber PPM implant 02/2012   Type II or unspecified type diabetes mellitus without mention of complication, not stated as uncontrolled     Past Surgical History:  Procedure Laterality Date   ABDOMINAL HYSTERECTOMY     CARDIAC CATHETERIZATION     2011  DR COOPER (APPT NEXT WEEK)   CHOLECYSTECTOMY     CORONARY ARTERY BYPASS GRAFT  2011   LIMA-LAD, SVG-DIAG, SVG-OM1-OM2, SVG-PDA   CORONARY STENT PLACEMENT     Drug-eluting stent to the left anterior descending, circumflex and right coronary artery in Jan 2009   EYE SURGERY     BIL CATARACT REMOVAL 06/2010   LEFT HEART CATHETERIZATION WITH CORONARY ANGIOGRAM N/A 12/02/2013   Procedure: LEFT HEART CATHETERIZATION WITH CORONARY ANGIOGRAM;  Surgeon: Lesleigh Noe, MD;  Location: [facility] CATH LAB;  Service: Cardiovascular;  Laterality: N/A;   OVARIAN CYST REMOVAL     PACEMAKER INSERTION  03/06/12   MDT Adapta L implanted by Dr Johney Frame for SSS   PERMANENT PACEMAKER INSERTION N/A 03/06/2012   Procedure: PERMANENT PACEMAKER INSERTION;  Surgeon: Hillis Range, MD;  Location: [facility] CATH LAB;  Service: Cardiovascular;  Laterality: N/A;   SHOULDER ARTHROSCOPY  06/16/2011   Procedure: ARTHROSCOPY SHOULDER;  Surgeon: Kennieth Rad, MD;  Location: [facility] OR;  Service: Orthopedics;  Laterality: Left;  LEFT SHOULDER ARTHROSCOPY ACROMIALPLASTY, POSSIBLE MINI OPEN CUFF REPAIR    TUBAL LIGATION      Family History  Problem Relation Age of Onset   Diabetes Mother    Hypertension Mother    Heart attack Mother    Stroke Father    Parkinson's disease Brother    Coronary artery disease Other     Social History:  reports that she has quit smoking. She has never used smokeless tobacco. She reports that she does not drink alcohol and does not use drugs.  Allergies:  Allergies  Allergen Reactions   Ciprofloxacin Nausea And Vomiting and Other  (See Comments)    Syncope, also    Codeine Other (See Comments)    HALLUCINATIONS   Hydrocodone Other (See Comments)    Made her pass out   Penicillins Hives   Shellfish Allergy Hives   Diltiazem Hcl Other (See Comments)    Low heart rate   Sulfonamide Derivatives Nausea And Vomiting   Morphine And Codeine Other (See Comments)    "Went crazy"   Lovastatin    Metformin Diarrhea    Medications: I have reviewed the patient's current medications.  Results for orders placed or performed during the hospital encounter of 08/31/22 (from the past 48 hour(s))  CBC with Differential     Status: None   Collection Time: 09/01/22  1:06 AM  Result Value Ref Range   WBC 10.4 4.0 - 10.5 K/uL   RBC 3.91 3.87 - 5.11 MIL/uL   Hemoglobin 12.3 12.0 - 15.0 g/dL   HCT 16.1 09.6 - 04.5 %   MCV 96.4 80.0 - 100.0 fL   MCH 31.5 26.0 - 34.0 pg   MCHC 32.6 30.0 - 36.0 g/dL   RDW 40.9 81.1 - 91.4 %   Platelets 170 150 - 400 K/uL   nRBC 0.0 0.0 - 0.2 %   Neutrophils Relative % 71 %   Neutro Abs 7.3 1.7 - 7.7 K/uL   Lymphocytes Relative 18 %   Lymphs Abs 1.9 0.7 - 4.0 K/uL   Monocytes Relative 9 %   Monocytes Absolute 0.9 0.1 - 1.0 K/uL   Eosinophils Relative 2 %   Eosinophils Absolute 0.3 0.0 - 0.5 K/uL   Basophils Relative 0 %   Basophils Absolute 0.0 0.0 - 0.1 K/uL   RBC Morphology MORPHOLOGY UNREMARKABLE    Smear Review MORPHOLOGY UNREMARKABLE    Immature Granulocytes 0 %   Abs Immature Granulocytes 0.04 0.00 - 0.07 K/uL   Reactive, Benign Lymphocytes PRESENT     Comment: Performed at [facility], [address] [place], [place] [address]  Basic metabolic panel     Status: Abnormal   Collection Time: 09/01/22  1:06 AM  Result Value Ref Range   Sodium 134 (L) 135 - 145 mmol/L   Potassium 3.9 3.5 - 5.1 mmol/L   Chloride 100 98 - 111 mmol/L   CO2 24 22 - 32 mmol/L   Glucose, Bld 163 (H) 70 - 99 mg/dL    Comment: Glucose reference range applies only to samples taken after  fasting for at least 8 hours.   BUN 34 (H) 8 - 23 mg/dL   Creatinine, Ser 6.21 (H) 0.44 - 1.00 mg/dL  Calcium 8.6 (L) 8.9 - 10.3 mg/dL   GFR, Estimated 37 (L) >60 mL/min    Comment: (NOTE) Calculated using the CKD-EPI Creatinine Equation (2021)    Anion gap 10 5 - 15    Comment: Performed at [facility], [address] [place], [place] [address]  Urinalysis, Routine w reflex microscopic -Urine, Clean Catch     Status: Abnormal   Collection Time: 09/01/22  2:23 AM  Result Value Ref Range   Color, Urine YELLOW YELLOW   APPearance CLEAR CLEAR   Specific Gravity, Urine 1.015 1.005 - 1.030   pH 6.5 5.0 - 8.0   Glucose, UA NEGATIVE NEGATIVE mg/dL   Hgb urine dipstick NEGATIVE NEGATIVE   Bilirubin Urine NEGATIVE NEGATIVE   Ketones, ur NEGATIVE NEGATIVE mg/dL   Protein, ur 30 (A) NEGATIVE mg/dL   Nitrite NEGATIVE NEGATIVE   Leukocytes,Ua NEGATIVE NEGATIVE    Comment: Performed at [facility], [address] [place], [place] [address]  Urinalysis, Microscopic (reflex)     Status: None   Collection Time: 09/01/22  2:23 AM  Result Value Ref Range   RBC / HPF NONE SEEN 0 - 5 RBC/hpf   WBC, UA 0-5 0 - 5 WBC/hpf   Bacteria, UA NONE SEEN NONE SEEN   Squamous Epithelial / HPF 0-5 0 - 5 /HPF    Comment: Performed at [facility], [address] [place], [place] [address]  Glucose, capillary     Status: Abnormal   Collection Time: 09/01/22 11:02 AM  Result Value Ref Range   Glucose-Capillary 159 (H) 70 - 99 mg/dL    Comment: Glucose reference range applies only to samples taken after fasting for at least 8 hours.  Glucose, capillary     Status: Abnormal   Collection Time: 09/01/22  4:42 PM  Result Value Ref Range   Glucose-Capillary 161 (H) 70 - 99 mg/dL    Comment: Glucose reference range applies only to samples taken after fasting for at least 8 hours.    CT Thoracic Spine Wo Contrast  Result Date: 09/01/2022 CLINICAL DATA:  Mid back pain EXAM: CT  THORACIC SPINE WITHOUT CONTRAST TECHNIQUE: Multidetector CT images of the thoracic were obtained using the standard protocol without intravenous contrast. RADIATION DOSE REDUCTION: This exam was performed according to the departmental dose-optimization program which includes automated exposure control, adjustment of the mA and/or kV according to patient size and/or use of iterative reconstruction technique. COMPARISON:  None Available. FINDINGS: Alignment: Normal. Vertebrae: No acute fracture or focal pathologic process. Paraspinal and other soft tissues: Intermediate sized hiatal hernia. Calcific aortic atherosclerosis. Disc levels: No spinal canal stenosis. IMPRESSION: 1. No acute fracture or static subluxation of the thoracic spine. 2. Intermediate sized hiatal hernia. Aortic Atherosclerosis (ICD10-I70.0). Electronically Signed   By: Deatra Robinson M.D.   On: 09/01/2022 03:30   CT Head Wo Contrast  Result Date: 09/01/2022 CLINICAL DATA:  Delirium. EXAM: CT HEAD WITHOUT CONTRAST TECHNIQUE: Contiguous axial images were obtained from the base of the skull through the vertex without intravenous contrast. RADIATION DOSE REDUCTION: This exam was performed according to the departmental dose-optimization program which includes automated exposure control, adjustment of the mA and/or kV according to patient size and/or use of iterative reconstruction technique. COMPARISON:  Head CT dated 08/03/2014. FINDINGS: Evaluation of this exam is limited due to motion artifact. Brain: Mild age-related atrophy and moderate chronic microvascular ischemic changes. There is no acute intracranial hemorrhage. No mass effect  or midline shift. No extra-axial fluid collection. Vascular: No hyperdense vessel or unexpected calcification. Skull: Normal. Negative for fracture or focal lesion. Sinuses/Orbits: No acute finding. Other: None IMPRESSION: 1. No acute intracranial pathology. 2. Mild age-related atrophy and moderate chronic  microvascular ischemic changes. Electronically Signed   By: Elgie Collard M.D.   On: 09/01/2022 01:41   CT PELVIS WO CONTRAST  Result Date: 09/01/2022 CLINICAL DATA:  Hip trauma, fracture suspected, aching left leg pain EXAM: CT PELVIS WITHOUT CONTRAST TECHNIQUE: Multidetector CT imaging of the pelvis was performed following the standard protocol without intravenous contrast. RADIATION DOSE REDUCTION: This exam was performed according to the departmental dose-optimization program which includes automated exposure control, adjustment of the mA and/or kV according to patient size and/or use of iterative reconstruction technique. COMPARISON:  Pelvic radiographs 08/31/2022 and CT abdomen and pelvis 03/04/2012 FINDINGS: Urinary Tract:  No abnormality visualized. Bowel:  Unremarkable visualized pelvic bowel loops. Vascular/Lymphatic: Aortic atherosclerotic calcification. No lymphadenopathy. Reproductive:  No mass or other significant abnormality Other:  None. Musculoskeletal: No acute fracture or dislocation. Degenerative changes pubic symphysis, both hips, SI joints and lower lumbar spine. Body wall edema. IMPRESSION: 1. No acute fracture or dislocation. Electronically Signed   By: Minerva Fester M.D.   On: 09/01/2022 00:39   DG FEMUR MIN 2 VIEWS LEFT  Result Date: 08/31/2022 CLINICAL DATA:  Aching left leg pain from hip to knee. Visible shortening of left leg. EXAM: PELVIS - 1-2 VIEW; LEFT FEMUR 2 VIEWS COMPARISON:  10/22/2020. FINDINGS: There is no evidence of pelvic fracture or diastasis. No acute fracture or dislocation at the left hip or knee. There are mild tricompartmental degenerative changes of the knee. No joint effusion at the knee. Vascular calcifications are noted in the soft tissues. Degenerative changes are noted in the lower lumbar spine. Surgical clips are noted in the inguinal region on the right and in the left lower extremity. IMPRESSION: No acute fracture or dislocation. Electronically  Signed   By: Thornell Sartorius M.D.   On: 08/31/2022 23:56   DG Pelvis 1-2 Views  Result Date: 08/31/2022 CLINICAL DATA:  Aching left leg pain from hip to knee. Visible shortening of left leg. EXAM: PELVIS - 1-2 VIEW; LEFT FEMUR 2 VIEWS COMPARISON:  10/22/2020. FINDINGS: There is no evidence of pelvic fracture or diastasis. No acute fracture or dislocation at the left hip or knee. There are mild tricompartmental degenerative changes of the knee. No joint effusion at the knee. Vascular calcifications are noted in the soft tissues. Degenerative changes are noted in the lower lumbar spine. Surgical clips are noted in the inguinal region on the right and in the left lower extremity. IMPRESSION: No acute fracture or dislocation. Electronically Signed   By: Thornell Sartorius M.D.   On: 08/31/2022 23:56    Review of Systems  Musculoskeletal:  Positive for arthralgias.  All other systems reviewed and are negative.  Blood pressure (!) 139/47, pulse (!) 59, temperature 97.8 F (36.6 C), temperature source Oral, resp. rate 18, height 5\' 7"  (1.702 m), weight 83 kg, SpO2 100 %. Physical Exam Vitals reviewed.  HENT:     Head: Normocephalic.     Nose: Nose normal.     Mouth/Throat:     Mouth: Mucous membranes are moist.  Eyes:     Pupils: Pupils are equal, round, and reactive to light.  Cardiovascular:     Rate and Rhythm: Normal rate.     Pulses: Normal pulses.  Pulmonary:  Effort: Pulmonary effort is normal.  Abdominal:     General: Abdomen is flat.  Musculoskeletal:     Cervical back: Normal range of motion.  Skin:    General: Skin is warm.     Capillary Refill: Capillary refill takes less than 2 seconds.  Neurological:     General: No focal deficit present.     Mental Status: She is alert.  Psychiatric:        Mood and Affect: Mood normal.   Exam demonstrates pretty reasonable grip strength bilaterally.  Describes some left shoulder soreness.  Bilateral lower extremities examined.  Right lower  extremity has 5 out of 5 ankle dorsiflexion plantarflexion quad hamstring strength with no groin pain on the right with internal or external rotation of the leg.  Pedal pulses palpable bilaterally.  Patient also has very good ankle dorsiflexion plantarflexion quad and hamstring strength as well as hip flexion strength on the left.  Does have pain on the left with internal/external rotation of the left leg.  No discrete trochanteric tenderness is present.  Circumduction of the hip is painful.  She also describes pain in the great toe MTP joint. Assessment/Plan: Impression is unclear etiology of left hip pain.  Patient cannot get an MRI scan which would be helpful in this particular case.  Differential diagnosis includes worsening lumbar spinal stenosis.  Also includes gout affecting the hip versus occult stress fracture of the left hip versus stress reaction.  Also could be sacral insufficiency fracture which would not be visualized on CT scan.  Recommend at this time CT scan of the lumbar spine along with uric acid level and ultrasound of left hip.  Does not appear to be infectious in origin with normal white count and no real fevers or chills as well as no evidence of pneumonia or UTI.  G Scott Jaquelyn Sakamoto 09/01/2022, 10:12 PM

## 2022-09-01 NOTE — ED Notes (Signed)
Report given to Brayton Caves, Charity fundraiser at [facility]

## 2022-09-01 NOTE — ED Notes (Signed)
[facility] called for Transport.  Spoke with Greggory Stallion @7 :47am

## 2022-09-01 NOTE — ED Notes (Signed)
Provided patient with cereal and juice. No further needs at this time. Alert and oriented, pain meds given.

## 2022-09-01 NOTE — Progress Notes (Signed)
I verified with Dr Kirby Crigler that he wanted the plavix and lovenox given today and he said yes.

## 2022-09-01 NOTE — ED Notes (Signed)
Report given to [facility]. 

## 2022-09-02 ENCOUNTER — Inpatient Hospital Stay (HOSPITAL_COMMUNITY): Payer: Medicare Other

## 2022-09-02 DIAGNOSIS — E11620 Type 2 diabetes mellitus with diabetic dermatitis: Secondary | ICD-10-CM | POA: Diagnosis not present

## 2022-09-02 DIAGNOSIS — M25552 Pain in left hip: Secondary | ICD-10-CM | POA: Diagnosis not present

## 2022-09-02 DIAGNOSIS — M25562 Pain in left knee: Secondary | ICD-10-CM | POA: Diagnosis not present

## 2022-09-02 DIAGNOSIS — N1832 Chronic kidney disease, stage 3b: Secondary | ICD-10-CM | POA: Diagnosis not present

## 2022-09-02 LAB — CBC
HCT: 33.6 % — ABNORMAL LOW (ref 36.0–46.0)
Hemoglobin: 10.7 g/dL — ABNORMAL LOW (ref 12.0–15.0)
MCH: 31.7 pg (ref 26.0–34.0)
MCHC: 31.8 g/dL (ref 30.0–36.0)
MCV: 99.4 fL (ref 80.0–100.0)
Platelets: 159 10*3/uL (ref 150–400)
RBC: 3.38 MIL/uL — ABNORMAL LOW (ref 3.87–5.11)
RDW: 12.8 % (ref 11.5–15.5)
WBC: 9.5 10*3/uL (ref 4.0–10.5)
nRBC: 0 % (ref 0.0–0.2)

## 2022-09-02 LAB — GLUCOSE, CAPILLARY
Glucose-Capillary: 134 mg/dL — ABNORMAL HIGH (ref 70–99)
Glucose-Capillary: 185 mg/dL — ABNORMAL HIGH (ref 70–99)
Glucose-Capillary: 117 mg/dL — ABNORMAL HIGH (ref 70–99)
Glucose-Capillary: 160 mg/dL — ABNORMAL HIGH (ref 70–99)

## 2022-09-02 LAB — BASIC METABOLIC PANEL
Anion gap: 9 (ref 5–15)
BUN: 30 mg/dL — ABNORMAL HIGH (ref 8–23)
CO2: 24 mmol/L (ref 22–32)
Calcium: 8.4 mg/dL — ABNORMAL LOW (ref 8.9–10.3)
Chloride: 101 mmol/L (ref 98–111)
Creatinine, Ser: 1.35 mg/dL — ABNORMAL HIGH (ref 0.44–1.00)
GFR, Estimated: 38 mL/min — ABNORMAL LOW (ref >60)
Glucose, Bld: 141 mg/dL — ABNORMAL HIGH (ref 70–99)
Potassium: 4.2 mmol/L (ref 3.5–5.1)
Sodium: 134 mmol/L — ABNORMAL LOW (ref 135–145)

## 2022-09-02 LAB — URIC ACID: Uric Acid, Serum: 5.5 mg/dL (ref 2.5–7.1)

## 2022-09-02 MED ORDER — TECHNETIUM TC 99M MEDRONATE IV KIT
22.0000 | PACK | Freq: Once | INTRAVENOUS | Status: AC
  Administered 2022-09-02: 22 via INTRAVENOUS

## 2022-09-02 NOTE — Plan of Care (Signed)
  Problem: Education: Goal: Knowledge of General Education information will improve Description: Including pain rating scale, medication(s)/side effects and non-pharmacologic comfort measures Outcome: Progressing   Problem: Health Behavior/Discharge Planning: Goal: Ability to manage health-related needs will improve Outcome: Progressing   Problem: Activity: Goal: Risk for activity intolerance will decrease Outcome: Progressing   

## 2022-09-02 NOTE — Hospital Course (Addendum)
16F with left hip pain and inability to ambulate.  Extensive imaging unrevealing.  Orthopedics involved.

## 2022-09-02 NOTE — Progress Notes (Signed)
  Progress Note   Patient: [name] [name]:[id number] DOB: 1933/04/11 DOA: 08/31/2022     1 DOS: the patient was seen and examined on 09/02/2022   Brief hospital course: 35F with left hip pain and inability to ambulate.  Extensive imaging unrevealing.  Orthopedics involved.   Assessment and Plan: Severe left hip and left knee pain Atraumatic.  New onset.  Etiology unclear.  Extensive investigation with imaging unrevealing including CT thoracic, lumbar spine, pelvis, hip ultrasound, bone scan.  Uric acid within normal limits.  No MRI secondary to pacemaker. Further recommendations per orthopedics.  CAD Permanent pacemaker placement Continue antihypertensive, Lasix, Plavix, Coreg   Essential hypertension Continue home amlodipine, Coreg   CKD stage IIIb (HCC) Renal function appears to be at baseline   Type 2 diabetes mellitus with diabetic dermatitis, without long-term current use of insulin (HCC) CBG stable, continue sliding scale insulin.  Resume home insulin as blood sugars demand     Subjective:  Reports ongoing left knee and left hip pain No injury No fall  Physical Exam: Vitals:   09/02/22 0218 09/02/22 0543 09/02/22 0845 09/02/22 1438  BP: (!) 142/49 (!) 140/57 (!) 150/47 (!) 153/56  Pulse: 60 60 64 (!) 58  Resp: 18 16    Temp: 99.3 F (37.4 C) 98.2 F (36.8 C)  98.5 F (36.9 C)  TempSrc: Oral Oral  Oral  SpO2: 98% 99% 99% 99%  Weight:      Height:       Physical Exam Vitals reviewed.  Constitutional:      General: She is not in acute distress.    Appearance: She is not ill-appearing or toxic-appearing.  Cardiovascular:     Rate and Rhythm: Normal rate and regular rhythm.     Heart sounds: No murmur heard. Pulmonary:     Effort: Pulmonary effort is normal. No respiratory distress.     Breath sounds: No wheezing, rhonchi or rales.  Musculoskeletal:     Left lower leg: No edema.     Comments: Marked pain medial knee joint line, movement left hip   Neurological:     Mental Status: She is alert.  Psychiatric:        Mood and Affect: Mood normal.        Behavior: Behavior normal.     Data Reviewed: BMP noted; creatinine at baseline 1.35, CKD IIIb Hgb 10.7 EKG paced rhythm  Family Communication: daughter at bedside (from [place])  Disposition: Status is: Inpatient Remains inpatient appropriate because: left hip pain  Planned Discharge Destination:  TBD    Time spent: 35 minutes  Author: Brendia Sacks, MD 09/02/2022 6:02 PM  For on call review [email or web address].

## 2022-09-02 NOTE — TOC Initial Note (Signed)
Transition of Care [facility]) - Initial/Assessment Note   Patient Details  Name: [name] MRN: [id number] Date of Birth: 01/09/1933  Transition of Care [facility]) CM/SW Contact:    Ewing Schlein, LCSW Phone Number: 09/02/2022, 10:04 AM  Clinical Narrative: TOC following for possible discharge needs.  Expected Discharge Plan:  (To be determined) Barriers to Discharge: Continued Medical Work up  Expected Discharge Plan and Services In-house Referral: Clinical Social Work Living arrangements for the past 2 months: Apartment  Prior Living Arrangements/Services Living arrangements for the past 2 months: Apartment Lives with:: Self Patient language and need for interpreter reviewed:: Yes Need for Family Participation in Patient Care: No (Comment) Care giver support system in place?: Yes (comment) Criminal Activity/Legal Involvement Pertinent to Current Situation/Hospitalization: No - Comment as needed  Activities of Daily Living Home Assistive Devices/Equipment: Walker (specify type) ADL Screening (condition at time of admission) Patient's cognitive ability adequate to safely complete daily activities?: Yes Is the patient deaf or have difficulty hearing?: Yes Does the patient have difficulty seeing, even when wearing glasses/contacts?: No Does the patient have difficulty concentrating, remembering, or making decisions?: No Patient able to express need for assistance with ADLs?: Yes Does the patient have difficulty dressing or bathing?: No Independently performs ADLs?: Yes (appropriate for developmental age) Does the patient have difficulty walking or climbing stairs?: Yes Weakness of Legs: Left Weakness of Arms/Hands: None  Emotional Assessment Orientation: : Oriented to Self, Oriented to Place, Oriented to  Time, Oriented to Situation Alcohol / Substance Use: Not Applicable Psych Involvement: No (comment)  Admission diagnosis:  Left hip pain [M25.552] Failure to thrive in adult  [R62.7] Acute pain of left hip [M25.552] Acute midline thoracic back pain [M54.6] Patient Active Problem List   Diagnosis Date Noted   Acute pain of left hip 09/01/2022   Need for pneumococcal 20-valent conjugate vaccination 08/22/2022   Rash 05/27/2022   Tremor 05/27/2022   Coronary artery disease involving native coronary artery of native heart without angina pectoris 09/14/2021   Pain due to onychomycosis of toenails of both feet 01/15/2021   Type 2 diabetes mellitus with diabetic dermatitis, without long-term current use of insulin (HCC) 01/15/2021   Palpitations 01/15/2018   Cardiac pacemaker in situ 01/15/2018   Heel spur, left 09/13/2017   Pain in left knee 08/23/2017   CKD (chronic kidney disease), stage III (HCC) 12/04/2016   Chest pain 11/23/2015   Type I (juvenile type) diabetes mellitus with renal manifestations, not stated as uncontrolled(250.41) 04/27/2012   Insulin dependent diabetes mellitus with complications 04/27/2012   AKI (acute kidney injury) (HCC) 03/05/2012   Diarrhea 03/05/2012   Carotid artery disease (HCC) 08/11/2010   Left shoulder pain 07/08/2010   Preventative health care 07/08/2010   HOARSENESS 06/04/2010   Pruritus 05/11/2010   ANEMIA-NOS 04/02/2010   (HFpEF) heart failure with preserved ejection fraction (HCC) 02/11/2010   SINUS BRADYCARDIA 10/30/2009   ALLERGIC RHINITIS 09/11/2009   BACK PAIN 09/11/2009   MUSCLE STRAIN, RIGHT BUTTOCK 09/11/2009   SHINGLES 05/11/2009   SHOULDER PAIN, LEFT 12/29/2008   Proteinuria 12/12/2008   Abdominal pain, unspecified site 09/24/2007   Hx of CABG 05/03/2007   CHEST PAIN 04/20/2007   Dizziness 02/28/2007   ANXIETY 02/15/2007   GERD 02/15/2007   Gastroparesis 02/15/2007   DISC DISEASE, CERVICAL 02/15/2007   DISC DISEASE, LUMBAR 02/15/2007   SPINAL STENOSIS, LUMBAR 02/15/2007   PERIPHERAL EDEMA 02/15/2007   Personal History of Other Diseases of Digestive Disease 02/15/2007   Hyperlipidemia LDL  goal <70  01/01/2007   GOUT 01/01/2007   Morbid obesity (HCC) 01/01/2007   DEPRESSION 01/01/2007   PERIPHERAL VASCULAR DISEASE 01/01/2007   DIVERTICULOSIS, COLON 01/01/2007   Osteoarthritis 01/01/2007   LOW BACK PAIN 01/01/2007   OSTEOPENIA 01/01/2007   Essential hypertension 10/26/2006   PCP:  Donato Schultz, DO Pharmacy:   [facility] [address] [place] - [address] [place] [address] Phone: [phone] Fax: [phone]  Social Determinants of Health (SDOH) Social History: SDOH Screenings   Food Insecurity: No Food Insecurity (09/01/2022)  Housing: Low Risk  (09/01/2022)  Transportation Needs: No Transportation Needs (09/01/2022)  Utilities: Not At Risk (09/01/2022)  Depression (PHQ2-9): Low Risk  (07/05/2022)  Financial Resource Strain: Low Risk  (11/01/2018)  Physical Activity: Insufficiently Active (02/01/2021)  Stress: No Stress Concern Present (05/04/2021)  Tobacco Use: Medium Risk (08/31/2022)   SDOH Interventions:    Readmission Risk Interventions     No data to display

## 2022-09-02 NOTE — Progress Notes (Signed)
Uric acid normal White count normal No left hip effusion CT lumbar spine pending Ordered bone scan for further evaluation of left lower extremity difficulty

## 2022-09-02 NOTE — Plan of Care (Signed)

## 2022-09-03 ENCOUNTER — Inpatient Hospital Stay (HOSPITAL_COMMUNITY): Payer: Medicare Other

## 2022-09-03 DIAGNOSIS — M25552 Pain in left hip: Secondary | ICD-10-CM | POA: Diagnosis not present

## 2022-09-03 DIAGNOSIS — E11620 Type 2 diabetes mellitus with diabetic dermatitis: Secondary | ICD-10-CM | POA: Diagnosis not present

## 2022-09-03 DIAGNOSIS — M25562 Pain in left knee: Secondary | ICD-10-CM | POA: Diagnosis not present

## 2022-09-03 LAB — GLUCOSE, CAPILLARY
Glucose-Capillary: 144 mg/dL — ABNORMAL HIGH (ref 70–99)
Glucose-Capillary: 171 mg/dL — ABNORMAL HIGH (ref 70–99)
Glucose-Capillary: 154 mg/dL — ABNORMAL HIGH (ref 70–99)
Glucose-Capillary: 111 mg/dL — ABNORMAL HIGH (ref 70–99)

## 2022-09-03 LAB — SEDIMENTATION RATE: Sed Rate: 55 mm/hr — ABNORMAL HIGH (ref 0–22)

## 2022-09-03 LAB — C-REACTIVE PROTEIN: CRP: 9.6 mg/dL — ABNORMAL HIGH (ref <1.0)

## 2022-09-03 NOTE — Progress Notes (Signed)
PT Cancellation Note  Patient Details Name: [name] MRN: [id number] DOB: March 17, 1933   Cancelled Treatment:    Reason Eval/Treat Not Completed: Patient not medically ready;Other (comment) Per ortho CT lumbar spine pending, Ordered bone scan for further evaluation of left lower extremity difficulty; will await results and further orders per ortho MD.   Drucilla Chalet 09/03/2022, 1:13 PM

## 2022-09-03 NOTE — Progress Notes (Signed)
  Progress Note   Patient: [name]:[id number] DOB: Dec 27, 1932 DOA: 08/31/2022     2 DOS: the patient was seen and examined on 09/03/2022   Brief hospital course: 26F with left hip pain and inability to ambulate.  Extensive imaging unrevealing.  Orthopedics involved.   Assessment and Plan: Severe left hip and left knee pain Atraumatic.  New onset.  Etiology unclear.  Extensive investigation with imaging unrevealing including CT thoracic, lumbar spine, pelvis; hip ultrasound, bone scan.  Uric acid within normal limits.  No MRI secondary to pacemaker. Check CRP and ESR Further recommendations per orthopedics.   CAD Permanent pacemaker placement Continue antihypertensive, Lasix, Plavix, Coreg   Essential hypertension Continue home amlodipine, Coreg   CKD stage IIIb (HCC) Renal function appears to be at baseline   Type 2 diabetes mellitus with diabetic dermatitis, without long-term current use of insulin (HCC) CBG stable, continue sliding scale insulin.  Resume home insulin as blood sugars demand      Subjective:  Still has left hip pain and knee pain  Physical Exam: Vitals:   09/02/22 2127 09/03/22 0500 09/03/22 1324 09/03/22 1333  BP: (!) 145/47 (!) 139/49 (!) 114/45   Pulse: 60 61 (!) 47 69  Resp: 18 17 17    Temp: 98.6 F (37 C) 98.2 F (36.8 C) 98.6 F (37 C)   TempSrc: Oral Oral Oral   SpO2: 98% 97% 100%   Weight:      Height:       Physical Exam Vitals reviewed.  Constitutional:      General: She is not in acute distress.    Appearance: She is not ill-appearing or toxic-appearing.  Cardiovascular:     Rate and Rhythm: Normal rate and regular rhythm.     Heart sounds: No murmur heard. Pulmonary:     Effort: Pulmonary effort is normal. No respiratory distress.     Breath sounds: No wheezing, rhonchi or rales.  Musculoskeletal:     Left lower leg: No edema.     Comments: Pain with movement of the knee or hip on the left.  Skin:    Comments: Skin  over the left knee and left upper leg appears unremarkable.  Groin appears unremarkable.  Neurological:     Mental Status: She is alert.  Psychiatric:        Mood and Affect: Mood normal.        Behavior: Behavior normal.   Data Reviewed: CBG stable  Family Communication: daughter by telephone  Disposition: Status is: Inpatient Remains inpatient appropriate because: unable to walk  Planned Discharge Destination:  TBD    Time spent: 20 minutes  Author: Brendia Sacks, MD 09/03/2022 3:35 PM  For on call review [email or web address].

## 2022-09-03 NOTE — Progress Notes (Signed)
Pt's daughter, Encarnacion Chu, requesting a phone call for provider updates. Her number is [phone].

## 2022-09-04 DIAGNOSIS — M25562 Pain in left knee: Secondary | ICD-10-CM | POA: Diagnosis not present

## 2022-09-04 DIAGNOSIS — E11620 Type 2 diabetes mellitus with diabetic dermatitis: Secondary | ICD-10-CM | POA: Diagnosis not present

## 2022-09-04 DIAGNOSIS — M25552 Pain in left hip: Secondary | ICD-10-CM | POA: Diagnosis not present

## 2022-09-04 LAB — CK: Total CK: 95 U/L (ref 38–234)

## 2022-09-04 LAB — GLUCOSE, CAPILLARY
Glucose-Capillary: 123 mg/dL — ABNORMAL HIGH (ref 70–99)
Glucose-Capillary: 208 mg/dL — ABNORMAL HIGH (ref 70–99)
Glucose-Capillary: 119 mg/dL — ABNORMAL HIGH (ref 70–99)
Glucose-Capillary: 149 mg/dL — ABNORMAL HIGH (ref 70–99)
Glucose-Capillary: 148 mg/dL — ABNORMAL HIGH (ref 70–99)

## 2022-09-04 NOTE — Evaluation (Signed)
Physical Therapy Evaluation Patient Details Name: [name] MRN: [id number] DOB: [birth year] Today's Date: 09/04/2022  History of Present Illness  Pt admitted from home with c/o L LE pain and lesser L UE pain.  Pt with hx of CAD, CHF, CKD, DDD, Chronic LBP, lumbar stenosis, OA, DM, CABG, sick sinus syndrome s/p pacemaker.  As of 09/04/22 multiple imagining showing no fx  Clinical Impression  Pt admitted as above and presenting with functional mobility limitations 2* generalized weakness, L LE pain, poor endurance, ambulatory balance deficits and obesity.  This date, pt requiring significant assist to stand from recliner and ambulate limited distance in room with RW.  Pt would benefit from follow up PT with 24/7 assist.     Recommendations for follow up therapy are one component of a multi-disciplinary discharge planning process, led by the attending physician.  Recommendations may be updated based on patient status, additional functional criteria and insurance authorization.  Follow Up Recommendations Can patient physically be transported by private vehicle: No     Assistance Recommended at Discharge Frequent or constant Supervision/Assistance  Patient can return home with the following  A lot of help with walking and/or transfers;A little help with bathing/dressing/bathroom;Assistance with cooking/housework;Assist for transportation;Help with stairs or ramp for entrance    Equipment Recommendations None recommended by PT  Recommendations for Other Services       Functional Status Assessment Patient has had a recent decline in their functional status and demonstrates the ability to make significant improvements in function in a reasonable and predictable amount of time.     Precautions / Restrictions Precautions Precautions: Fall Restrictions Weight Bearing Restrictions: No      Mobility  Bed Mobility               General bed mobility comments: NT, pt up in chair and  requests back to same    Transfers Overall transfer level: Needs assistance Equipment used: Rolling walker (2 wheels) Transfers: Sit to/from Stand Sit to Stand: Min assist, Mod assist, +2 physical assistance, +2 safety/equipment           General transfer comment: cues for use of UEs to self assist with physical assist to bring wt up and fwd and to balance in standing with RW    Ambulation/Gait Ambulation/Gait assistance: Min assist, +2 safety/equipment (chair follow for safety) Gait Distance (Feet): 10 Feet Assistive device: Rolling walker (2 wheels) Gait Pattern/deviations: Step-to pattern, Decreased step length - right, Decreased step length - left, Shuffle, Trunk flexed, Decreased stance time - left, Wide base of support Gait velocity: decr     General Gait Details: Increased time with cues for sequence, posture and position from RW.  Distance ltd by fatigue  Stairs            Wheelchair Mobility    Modified Rankin (Stroke Patients Only)       Balance Overall balance assessment: Needs assistance Sitting-balance support: Feet supported, No upper extremity supported Sitting balance-Leahy Scale: Good     Standing balance support: Bilateral upper extremity supported Standing balance-Leahy Scale: Poor                               Pertinent Vitals/Pain Pain Assessment Pain Assessment: 0-10 Pain Score: 6  Pain Location: "my Left leg" Pain Descriptors / Indicators: Aching, Sore Pain Intervention(s): Limited activity within patient's tolerance, Monitored during session, Premedicated before session    Home Living Family/patient expects  to be discharged to:: Private residence Living Arrangements: Children Available Help at Discharge: Family;Available PRN/intermittently;Personal care attendant (pt states lives with dtr but dtr works, Engineer, production in to assist with showers and cleaning) Type of Home: House Home Access: Stairs to enter Entrance  Stairs-Rails: Right Entrance Stairs-Number of Steps: 5   Home Layout: Able to live on main level with bedroom/bathroom Home Equipment: Rollator (4 wheels);Cane - single point;BSC/3in1;Other (comment) (lift chair)      Prior Function Prior Level of Function : Needs assist             Mobility Comments: using rollator and lift chair ADLs Comments: pt reports has aide to assist with showers     Hand Dominance        Extremity/Trunk Assessment   Upper Extremity Assessment Upper Extremity Assessment: Generalized weakness    Lower Extremity Assessment Lower Extremity Assessment: Generalized weakness;RLE deficits/detail;LLE deficits/detail LLE: Unable to fully assess due to pain    Cervical / Trunk Assessment Cervical / Trunk Assessment: Kyphotic  Communication   Communication: No difficulties  Cognition Arousal/Alertness: Awake/alert Behavior During Therapy: WFL for tasks assessed/performed Overall Cognitive Status: Within Functional Limits for tasks assessed                                          General Comments      Exercises     Assessment/Plan    PT Assessment Patient needs continued PT services  PT Problem List Decreased strength;Decreased range of motion;Decreased activity tolerance;Decreased balance;Decreased mobility;Decreased knowledge of use of DME;Obesity;Pain       PT Treatment Interventions DME instruction;Gait training;Stair training;Functional mobility training;Therapeutic activities;Therapeutic exercise;Patient/family education    PT Goals (Current goals can be found in the Care Plan section)  Acute Rehab PT Goals Patient Stated Goal: Regain IND PT Goal Formulation: With patient Time For Goal Achievement: 09/18/22 Potential to Achieve Goals: Fair    Frequency Min 1X/week     Co-evaluation               AM-PAC PT "6 Clicks" Mobility  Outcome Measure Help needed turning from your back to your side while in a  flat bed without using bedrails?: A Lot Help needed moving from lying on your back to sitting on the side of a flat bed without using bedrails?: A Lot Help needed moving to and from a bed to a chair (including a wheelchair)?: A Lot Help needed standing up from a chair using your arms (e.g., wheelchair or bedside chair)?: A Lot Help needed to walk in hospital room?: Total Help needed climbing 3-5 steps with a railing? : Total 6 Click Score: 10    End of Session Equipment Utilized During Treatment: Gait belt Activity Tolerance: Patient limited by fatigue;Patient limited by pain Patient left: in chair;with call bell/phone within reach;with chair alarm set Nurse Communication: Mobility status PT Visit Diagnosis: Unsteadiness on feet (R26.81);Muscle weakness (generalized) (M62.81);Difficulty in walking, not elsewhere classified (R26.2);Pain Pain - Right/Left: Left Pain - part of body: Leg    Time: 1610-9604 PT Time Calculation (min) (ACUTE ONLY): 29 min   Charges:   PT Evaluation $PT Eval Low Complexity: 1 Low PT Treatments $Gait Training: 8-22 mins        Mauro Kaufmann PT Acute Rehabilitation Services Pager [phone] Office [phone]   [facility] 09/04/2022, 12:39 PM

## 2022-09-04 NOTE — Progress Notes (Signed)
  Progress Note   Patient: [name] ZOX:[id number] DOB: May 26, 1932 DOA: 08/31/2022     3 DOS: the patient was seen and examined on 09/04/2022   Brief hospital course: 66F with left hip pain and inability to ambulate.  Extensive imaging unrevealing.  Orthopedics involved.   Assessment and Plan: Severe left hip and left knee pain Atraumatic.  New onset.  Etiology unclear.  Extensive investigation with imaging unrevealing including CT thoracic, lumbar spine, pelvis; hip ultrasound, bone scan. Knee film showed arthritis.  Uric acid within normal limits.  No MRI secondary to pacemaker. CRP and ESR elevated, unclear significance Walked 10 feet with PT, with recommendation for SNF   CAD Permanent pacemaker placement Stable. Continue Lasix, Plavix, Coreg   Essential hypertension Stable. Continue home amlodipine, Coreg   CKD stage IIIb (HCC) Renal function appears to be at baseline   Type 2 diabetes mellitus with diabetic dermatitis, without long-term current use of insulin (HCC) CBG remains stable, continue sliding scale insulin.  Resume home insulin as blood sugars demand      Subjective:  Still has pain  Physical Exam: Vitals:   09/03/22 1324 09/03/22 1333 09/03/22 2121 09/04/22 0555  BP: (!) 114/45  (!) 137/46 (!) 152/60  Pulse: (!) 47 69 68 64  Resp: 17  16 19   Temp: 98.6 F (37 C)  97.9 F (36.6 C) 98.6 F (37 C)  TempSrc: Oral   Oral  SpO2: 100%  97% 97%  Weight:      Height:       Physical Exam Vitals reviewed.  Constitutional:      General: She is not in acute distress.    Appearance: She is not ill-appearing or toxic-appearing.  Cardiovascular:     Rate and Rhythm: Normal rate and regular rhythm.     Heart sounds: No murmur heard. Pulmonary:     Effort: Pulmonary effort is normal. No respiratory distress.     Breath sounds: No wheezing, rhonchi or rales.  Musculoskeletal:     Comments: Can flex knee more today  Skin:    Comments: No change in skin  left going, thigh, left knee  Neurological:     Mental Status: She is alert.  Psychiatric:        Mood and Affect: Mood normal.        Behavior: Behavior normal.     Data Reviewed: CRP 9.6 ESR 55  Family Communication: none  Disposition: Status is: Inpatient Remains inpatient appropriate because: hip pain  Planned Discharge Destination:  TBD    Time spent: 35 minutes  Author: Brendia Sacks, MD 09/04/2022 8:34 AM  For on call review [email or web address].

## 2022-09-04 NOTE — Plan of Care (Signed)
  Problem: Education: Goal: Knowledge of General Education information will improve Description Including pain rating scale, medication(s)/side effects and non-pharmacologic comfort measures Outcome: Progressing   

## 2022-09-05 DIAGNOSIS — M25552 Pain in left hip: Secondary | ICD-10-CM | POA: Diagnosis not present

## 2022-09-05 DIAGNOSIS — M25562 Pain in left knee: Secondary | ICD-10-CM | POA: Diagnosis not present

## 2022-09-05 DIAGNOSIS — E11620 Type 2 diabetes mellitus with diabetic dermatitis: Secondary | ICD-10-CM | POA: Diagnosis not present

## 2022-09-05 LAB — GLUCOSE, CAPILLARY
Glucose-Capillary: 115 mg/dL — ABNORMAL HIGH (ref 70–99)
Glucose-Capillary: 142 mg/dL — ABNORMAL HIGH (ref 70–99)
Glucose-Capillary: 126 mg/dL — ABNORMAL HIGH (ref 70–99)
Glucose-Capillary: 164 mg/dL — ABNORMAL HIGH (ref 70–99)

## 2022-09-05 NOTE — Evaluation (Signed)
Occupational Therapy Evaluation Patient Details Name: FREDNA WINNIE MRN: [id number] DOB: 1932/07/21 Today's Date: 09/05/2022   History of Present Illness Patient is a 89 year old female who was admitted from home with c/o L LE pain and lesser L UE pain. As of 09/04/22 multiple imagining showing no fx. Pt with hx of CAD, CHF, CKD, DDD, Chronic LBP, lumbar stenosis, OA, DM, CABG, sick sinus syndrome s/p pacemaker.   Clinical Impression   Patient is a 89 year old female who was admitted for above. Patient was living at home with daughter with personal caregiver during the day. Currently, patient is mod A for transfers in and out of recliner with increased time. Session was limited with patient's onset of dizziness. BP sitting in recliner was 140/50 mmhg HR 59 bpm. Standing with RW blood pressure was 151/53 mmhg and HR was 59 bpm. Patient continued to endorse "lightheadedness" and dizziness with all standing. Nurse made aware. Patient would continue to benefit from skilled OT services at this time while admitted and after d/c to address noted deficits in order to improve overall safety and independence in ADLs.        Recommendations for follow up therapy are one component of a multi-disciplinary discharge planning process, led by the attending physician.  Recommendations may be updated based on patient status, additional functional criteria and insurance authorization.   Assistance Recommended at Discharge Frequent or constant Supervision/Assistance  Patient can return home with the following A lot of help with walking and/or transfers;A lot of help with bathing/dressing/bathroom;Assistance with cooking/housework;Direct supervision/assist for medications management;Assist for transportation;Help with stairs or ramp for entrance;Direct supervision/assist for financial management    Functional Status Assessment  Patient has had a recent decline in their functional status and demonstrates the ability  to make significant improvements in function in a reasonable and predictable amount of time.  Equipment Recommendations  None recommended by OT       Precautions / Restrictions Precautions Precautions: Fall Restrictions Weight Bearing Restrictions: No      Mobility Bed Mobility               General bed mobility comments: NT, pt up in chair and requests back to same           Balance Overall balance assessment: Needs assistance Sitting-balance support: Feet supported, No upper extremity supported Sitting balance-Leahy Scale: Good     Standing balance support: Bilateral upper extremity supported, Reliant on assistive device for balance Standing balance-Leahy Scale: Poor           ADL either performed or assessed with clinical judgement   ADL Overall ADL's : Needs assistance/impaired Eating/Feeding: Set up;Sitting   Grooming: Set up;Sitting   Upper Body Bathing: Minimal assistance;Sitting   Lower Body Bathing: Maximal assistance;Sitting/lateral leans   Upper Body Dressing : Minimal assistance;Sitting   Lower Body Dressing: Maximal assistance;Sitting/lateral leans   Toilet Transfer: Moderate assistance;Rolling walker (2 wheels) Toilet Transfer Details (indicate cue type and reason): for sit to stands from recliner with patient reporting onset of dizziness. patient returned to recliner with physical A needed to slow down sitting into recliner. Toileting- Clothing Manipulation and Hygiene: Maximal assistance;Sit to/from stand                Pertinent Vitals/Pain Pain Assessment Pain Assessment: 0-10 Pain Score: 5  Pain Location: "my Left leg" Pain Descriptors / Indicators: Aching, Sore Pain Intervention(s): Limited activity within patient's tolerance, Monitored during session, Premedicated before session  Extremity/Trunk Assessment Upper Extremity Assessment Upper Extremity Assessment: LUE deficits/detail;RUE deficits/detail RUE Deficits  / Details: patient was noted to have ROM to about 80 degrees on this side with patient reporting "soreness" with movement of shoulders. patient was educated on importance of movement of shoulders each day. LUE Deficits / Details: noted to have several digits with missing DIPs. patient with limited ROM of shoulder to about 70 degrees.       Cervical / Trunk Assessment Cervical / Trunk Assessment: Kyphotic   Communication Communication Communication: No difficulties   Cognition Arousal/Alertness: Awake/alert Behavior During Therapy: WFL for tasks assessed/performed Overall Cognitive Status: Within Functional Limits for tasks assessed         General Comments: patient had multiple family members in room during session. patient was plesant and cooperative. motivated to be able to move.                Home Living Family/patient expects to be discharged to:: Private residence Living Arrangements: Children Available Help at Discharge: Family;Available PRN/intermittently;Personal care attendant (pt states lives with dtr but dtr works, Engineer, production in to assist with showers and cleaning) Type of Home: House Home Access: Stairs to enter [employer] of Steps: 5 Entrance Stairs-Rails: Right Home Layout: Able to live on main level with bedroom/bathroom     Bathroom Shower/Tub: Chief Strategy Officer: Standard     Home Equipment: Rollator (4 wheels);Cane - single point;BSC/3in1;Other (comment)   Additional Comments: lift chair      Prior Functioning/Environment Prior Level of Function : Needs assist             Mobility Comments: using rollator and lift chair ADLs Comments: pt reports has aide to assist with showers        OT Problem List: Decreased activity tolerance;Impaired balance (sitting and/or standing);Decreased coordination;Decreased safety awareness;Decreased knowledge of precautions;Pain      OT Treatment/Interventions: Self-care/ADL  training;Therapeutic exercise;DME and/or AE instruction;Therapeutic activities;Patient/family education    OT Goals(Current goals can be found in the care plan section) Acute Rehab OT Goals Patient Stated Goal: to get better OT Goal Formulation: With patient/family Time For Goal Achievement: 09/19/22 Potential to Achieve Goals: Fair  OT Frequency: Min 1X/week       AM-PAC OT "6 Clicks" Daily Activity     Outcome Measure Help from another person eating meals?: A Little Help from another person taking care of personal grooming?: A Little Help from another person toileting, which includes using toliet, bedpan, or urinal?: A Lot Help from another person bathing (including washing, rinsing, drying)?: A Lot Help from another person to put on and taking off regular upper body clothing?: A Little Help from another person to put on and taking off regular lower body clothing?: A Lot 6 Click Score: 15   End of Session Equipment Utilized During Treatment: Gait belt;Rolling walker (2 wheels) Nurse Communication: Other (comment) (ok to participate in session, updated on patients dizziness and BPs)  Activity Tolerance: Patient limited by fatigue;Other (comment) (dizziness) Patient left: in chair;with call bell/phone within reach;with family/visitor present  OT Visit Diagnosis: Unsteadiness on feet (R26.81);Other abnormalities of gait and mobility (R26.89);History of falling (Z91.81)                Time: 8119-1478 OT Time Calculation (min): 23 min Charges:  OT General Charges $OT Visit: 1 Visit OT Evaluation $OT Eval Moderate Complexity: 1 Mod OT Treatments $Therapeutic Activity: 8-22 mins  Rosalio Loud, MS Acute Rehabilitation Department Office# [phone]  Selinda Flavin 09/05/2022, 12:47 PM

## 2022-09-05 NOTE — [place] FL2 (Signed)
[place] MEDICAID FL2 LEVEL OF CARE FORM     IDENTIFICATION  Patient Name: [name] Birthdate: 10-Feb-1933 Sex: female Admission Date (Current Location): 08/31/2022  [facility] and [place] Number:  Producer, television/film/video and Address:  [facility],  [address] [place], [place] [address]      Provider Number: [id number]  Attending Physician Name and Address:  Standley Brooking, MD  Relative Name and Phone Number:  Konrad Felix (grandson) Ph: [phone]    Current Level of Care: Hospital Recommended Level of Care: Skilled Nursing Facility Prior Approval Number:    Date Approved/Denied:   PASRR Number: [id number]  Discharge Plan: SNF    Current Diagnoses: Patient Active Problem List   Diagnosis Date Noted   Acute pain of left hip 09/01/2022   Need for pneumococcal 20-valent conjugate vaccination 08/22/2022   Rash 05/27/2022   Tremor 05/27/2022   Coronary artery disease involving native coronary artery of native heart without angina pectoris 09/14/2021   Pain due to onychomycosis of toenails of both feet 01/15/2021   Type 2 diabetes mellitus with diabetic dermatitis, without long-term current use of insulin (HCC) 01/15/2021   Palpitations 01/15/2018   Cardiac pacemaker in situ 01/15/2018   Heel spur, left 09/13/2017   Left knee pain 08/23/2017   CKD (chronic kidney disease), stage III (HCC) 12/04/2016   Chest pain 11/23/2015   Type I (juvenile type) diabetes mellitus with renal manifestations, not stated as uncontrolled(250.41) 04/27/2012   Insulin dependent diabetes mellitus with complications 04/27/2012   AKI (acute kidney injury) (HCC) 03/05/2012   Diarrhea 03/05/2012   Carotid artery disease (HCC) 08/11/2010   Left shoulder pain 07/08/2010   Preventative health care 07/08/2010   HOARSENESS 06/04/2010   Pruritus 05/11/2010   ANEMIA-NOS 04/02/2010   (HFpEF) heart failure with preserved ejection fraction (HCC) 02/11/2010   SINUS BRADYCARDIA  10/30/2009   ALLERGIC RHINITIS 09/11/2009   BACK PAIN 09/11/2009   MUSCLE STRAIN, RIGHT BUTTOCK 09/11/2009   SHINGLES 05/11/2009   SHOULDER PAIN, LEFT 12/29/2008   Proteinuria 12/12/2008   Abdominal pain, unspecified site 09/24/2007   Hx of CABG 05/03/2007   CHEST PAIN 04/20/2007   Dizziness 02/28/2007   ANXIETY 02/15/2007   GERD 02/15/2007   Gastroparesis 02/15/2007   DISC DISEASE, CERVICAL 02/15/2007   DISC DISEASE, LUMBAR 02/15/2007   SPINAL STENOSIS, LUMBAR 02/15/2007   PERIPHERAL EDEMA 02/15/2007   Personal History of Other Diseases of Digestive Disease 02/15/2007   Hyperlipidemia LDL goal <70 01/01/2007   GOUT 01/01/2007   Morbid obesity (HCC) 01/01/2007   DEPRESSION 01/01/2007   PERIPHERAL VASCULAR DISEASE 01/01/2007   DIVERTICULOSIS, COLON 01/01/2007   Osteoarthritis 01/01/2007   LOW BACK PAIN 01/01/2007   OSTEOPENIA 01/01/2007   Essential hypertension 10/26/2006    Orientation RESPIRATION BLADDER Height & Weight     Self, Time, Situation, Place  Normal Continent Weight: 183 lb (83 kg) Height:  5\' 7"  (170.2 cm)  BEHAVIORAL SYMPTOMS/MOOD NEUROLOGICAL BOWEL NUTRITION STATUS   (N/A)  (N/A) Continent Diet  AMBULATORY STATUS COMMUNICATION OF NEEDS Skin   Extensive Assist Verbally Normal                       Personal Care Assistance Level of Assistance  Bathing, Feeding, Dressing Bathing Assistance: Limited assistance Feeding assistance: Independent Dressing Assistance: Limited assistance     Functional Limitations Info  Sight, Hearing, Speech Sight Info: Impaired Hearing Info: Impaired Speech Info: Adequate    SPECIAL CARE FACTORS FREQUENCY  PT (By licensed PT), OT (By licensed OT)     PT Frequency: 5x's/week OT Frequency: 5x's/week            Contractures Contractures Info: Not present    Additional Factors Info  Code Status, Allergies, Psychotropic, Insulin Sliding Scale Code Status Info: DNR Allergies Info: Ciprofloxacin, Codeine,  Hydrocodone, Penicillins, Shellfish Allergy, Diltiazem Hcl, Sulfonamide Derivatives, Morphine And Codeine, Lovastatin, Metformin Psychotropic Info: Desyrel Insulin Sliding Scale Info: See discharge summary       Current Medications (09/05/2022):  This is the current hospital active medication list Current Facility-Administered Medications  Medication Dose Route Frequency Provider Last Rate Last Admin   acetaminophen (TYLENOL) tablet 650 mg  650 mg Oral Q6H PRN Kirby Crigler, Mir M, MD   650 mg at 09/05/22 0445   Or   acetaminophen (TYLENOL) suppository 650 mg  650 mg Rectal Q6H PRN Kirby Crigler, Mir M, MD       albuterol (PROVENTIL) (2.5 MG/3ML) 0.083% nebulizer solution 2.5 mg  2.5 mg Nebulization Q2H PRN Kirby Crigler, Mir M, MD       amLODipine (NORVASC) tablet 5 mg  5 mg Oral BID Molpus, John, MD   5 mg at 09/05/22 1001   carvedilol (COREG) tablet 12.5 mg  12.5 mg Oral BID Molpus, John, MD   12.5 mg at 09/05/22 1002   clopidogrel (PLAVIX) tablet 75 mg  75 mg Oral Daily Molpus, John, MD   75 mg at 09/05/22 1002   enoxaparin (LOVENOX) injection 40 mg  40 mg Subcutaneous Q24H Kirby Crigler, Mir M, MD   40 mg at 09/04/22 1234   furosemide (LASIX) tablet 40 mg  40 mg Oral Daily Molpus, John, MD   40 mg at 09/05/22 1002   hydrALAZINE (APRESOLINE) injection 5 mg  5 mg Intravenous Q6H PRN Kirby Crigler, Mir M, MD       HYDROmorphone (DILAUDID) injection 1 mg  1 mg Intravenous Q3H PRN Kirby Crigler, Mir M, MD   1 mg at 09/01/22 1048   insulin aspart (novoLOG) injection 0-15 Units  0-15 Units Subcutaneous TID WC Kirby Crigler, Mir M, MD   5 Units at 09/04/22 1307   insulin aspart (novoLOG) injection 0-5 Units  0-5 Units Subcutaneous QHS Kirby Crigler, Mir M, MD       ondansetron [facility]) tablet 4 mg  4 mg Oral Q6H PRN Kirby Crigler, Mir M, MD       Or   ondansetron [facility]) injection 4 mg  4 mg Intravenous Q6H PRN Kirby Crigler, Mir M, MD   4 mg at 09/01/22 1149   oxyCODONE (Oxy IR/ROXICODONE) immediate release tablet 5 mg  5  mg Oral Q3H PRN Kirby Crigler, Mir M, MD       pantoprazole (PROTONIX) EC tablet 40 mg  40 mg Oral Daily Molpus, John, MD   40 mg at 09/05/22 1001   potassium chloride (KLOR-CON M) CR tablet 10 mEq  10 mEq Oral Daily Molpus, John, MD   10 mEq at 09/05/22 1001   traZODone (DESYREL) tablet 25 mg  25 mg Oral QHS PRN Maryln Gottron, MD         Discharge Medications: Please see discharge summary for a list of discharge medications.  Relevant Imaging Results:  Relevant Lab Results:   Additional Information SSN: [id number]  Ewing Schlein, LCSW

## 2022-09-05 NOTE — TOC Progression Note (Signed)
Transition of Care [facility] - Progression Note   Patient Details  Name: [name] MRN: [id number] Date of Birth: 1932/04/17  Transition [facility]) CM/SW Contact  Ewing Schlein, LCSW Phone Number: 09/05/2022, 11:16 AM  Clinical Narrative: PT evaluation recommended SNF. Patient is agreeable to short-term rehab. CSW followed up with patient's grandson, Konrad Felix, and he will update the family regarding rehab. FL2 done; PASRR verified. Initial referral faxed out. TOC awaiting bed offers.  Expected Discharge Plan: Skilled Nursing Facility Barriers to Discharge: Continued Medical Work up  Expected Discharge Plan and Services In-house Referral: Clinical Social Work Living arrangements for the past 2 months: Apartment  Social Determinants of Health (SDOH) Interventions SDOH Screenings   Food Insecurity: No Food Insecurity (09/01/2022)  Housing: Low Risk  (09/01/2022)  Transportation Needs: No Transportation Needs (09/01/2022)  Utilities: Not At Risk (09/01/2022)  Depression (PHQ2-9): Low Risk  (07/05/2022)  Financial Resource Strain: Low Risk  (11/01/2018)  Physical Activity: Insufficiently Active (02/01/2021)  Stress: No Stress Concern Present (05/04/2021)  Tobacco Use: Medium Risk (08/31/2022)   Readmission Risk Interventions     No data to display

## 2022-09-05 NOTE — Progress Notes (Signed)
  Progress Note   Patient: [name] ZOX:[id number] DOB: 08/23/1932 DOA: 08/31/2022     4 DOS: the patient was seen and examined on 09/05/2022   Brief hospital course: 28F with left hip pain and inability to ambulate.  Extensive imaging unrevealing.  Orthopedics involved.    Assessment and Plan: Severe left hip and left knee pain Atraumatic.  New onset.  Etiology unclear.  Extensive investigation with imaging unrevealing including CT thoracic, lumbar spine, pelvis; hip ultrasound, bone scan. Knee film showed arthritis.  Uric acid within normal limits.  No MRI secondary to pacemaker. CRP and ESR elevated, unclear significance Plan for SNF   CAD Permanent pacemaker placement Stable. Continue Lasix, Plavix, Coreg   Essential hypertension Stable. Continue home amlodipine, Coreg   CKD stage IIIb (HCC) Renal function appears to be at baseline   Type 2 diabetes mellitus with diabetic dermatitis, without long-term current use of insulin (HCC) CBG remains stable, continue sliding scale insulin.       Subjective:  Continues to have left hip and knee pain  Physical Exam: Vitals:   09/04/22 1628 09/04/22 2015 09/05/22 0535 09/05/22 1338  BP: 137/60 (!) 137/50 (!) 125/41 (!) 135/46  Pulse: 60 (!) 57 60 (!) 59  Resp:  17 17 18   Temp:  99 F (37.2 C) 98.4 F (36.9 C) 98 F (36.7 C)  TempSrc:  Oral Oral Oral  SpO2:  98% 97% 100%  Weight:      Height:       Physical Exam Vitals reviewed.  Constitutional:      General: She is not in acute distress.    Appearance: She is not ill-appearing or toxic-appearing.  Cardiovascular:     Rate and Rhythm: Normal rate and regular rhythm.     Heart sounds: No murmur heard. Pulmonary:     Effort: Pulmonary effort is normal. No respiratory distress.     Breath sounds: No wheezing, rhonchi or rales.  Musculoskeletal:     Comments: Able to bend left knee more, approximately 70 degrees, able to lift left leg with more strength.  Skin:     Comments: Left knee and left groin and upper thigh appear unremarkable  Neurological:     Mental Status: She is alert.     Data Reviewed: CBG stable  Family Communication: none  Disposition: Status is: Inpatient Remains inpatient appropriate because: needs SNF  Planned Discharge Destination: Skilled nursing facility    Time spent: 20 minutes  Author: Brendia Sacks, MD 09/05/2022 3:29 PM  For on call review [email or web address].

## 2022-09-06 DIAGNOSIS — M25552 Pain in left hip: Secondary | ICD-10-CM | POA: Diagnosis not present

## 2022-09-06 DIAGNOSIS — N1832 Chronic kidney disease, stage 3b: Secondary | ICD-10-CM | POA: Diagnosis not present

## 2022-09-06 DIAGNOSIS — E11620 Type 2 diabetes mellitus with diabetic dermatitis: Secondary | ICD-10-CM | POA: Diagnosis not present

## 2022-09-06 LAB — GLUCOSE, CAPILLARY
Glucose-Capillary: 126 mg/dL — ABNORMAL HIGH (ref 70–99)
Glucose-Capillary: 134 mg/dL — ABNORMAL HIGH (ref 70–99)
Glucose-Capillary: 152 mg/dL — ABNORMAL HIGH (ref 70–99)
Glucose-Capillary: 135 mg/dL — ABNORMAL HIGH (ref 70–99)

## 2022-09-06 MED ORDER — KETOTIFEN FUMARATE 0.035 % OP SOLN
1.0000 [drp] | Freq: Two times a day (BID) | OPHTHALMIC | Status: DC
  Administered 2022-09-06 – 2022-09-07 (×2): 1 [drp] via OPHTHALMIC
  Filled 2022-09-06: qty 5

## 2022-09-06 NOTE — Progress Notes (Signed)
RN messaged provider, Irene Limbo, to ask if amlodipine 5mg , carvedilol 12.5mg  and lasix 40mg  should be given due to pts BP 132/41, map 69, P 62. Per Dr. Irene Limbo, give all 3 meds. Dr. Irene Limbo is aware of pt requesting azelestine eye drops.

## 2022-09-06 NOTE — Progress Notes (Signed)
Physical Therapy Treatment Patient Details Name: [name] MRN: [id number] DOB: 04/09/1933 Today's Date: 09/06/2022   History of Present Illness Patient is a 89 year old female who was admitted from home with c/o L LE pain and lesser L UE pain. As of 09/04/22 multiple imagining showing no fx. Pt with hx of CAD, CHF, CKD, DDD, Chronic LBP, lumbar stenosis, OA, DM, CABG, sick sinus syndrome s/p pacemaker.    PT Comments    General Comments: AxO x 3 very pleasant Lady.  Lives home with a daughter "but she works" and a GrandSon 25yo.  Pt already OOB in recliner.  Assisted with amb a limited distance in hallway.  General Gait Details: Increased time with cues for sequence, posture and position from RW.  Distance ltd by fatigue. Pt will need ST Rehab at SNF to address mobility and functional decline prior to safely returning home.   Recommendations for follow up therapy are one component of a multi-disciplinary discharge planning process, led by the attending physician.  Recommendations may be updated based on patient status, additional functional criteria and insurance authorization.  Follow Up Recommendations  Can patient physically be transported by private vehicle: No    Assistance Recommended at Discharge Frequent or constant Supervision/Assistance  Patient can return home with the following A lot of help with walking and/or transfers;A little help with bathing/dressing/bathroom;Assistance with cooking/housework;Assist for transportation;Help with stairs or ramp for entrance   Equipment Recommendations  None recommended by PT    Recommendations for Other Services       Precautions / Restrictions Precautions Precautions: Fall Restrictions Weight Bearing Restrictions: No     Mobility  Bed Mobility               General bed mobility comments: OOB in recliner    Transfers Overall transfer level: Needs assistance Equipment used: Rolling walker (2 wheels) Transfers:  Sit to/from Stand Sit to Stand: Mod assist           General transfer comment: cues for use of UEs to self assist with physical assist to bring wt up and fwd and to balance in standing with RW.  Pt is use to her LIFT CHAIR    Ambulation/Gait Ambulation/Gait assistance: Mod assist, Min assist Gait Distance (Feet): 18 Feet (9 feet x 2 recliner following for safety) Assistive device: Rolling walker (2 wheels) Gait Pattern/deviations: Step-to pattern, Decreased step length - right, Decreased step length - left, Shuffle, Trunk flexed, Decreased stance time - left, Wide base of support Gait velocity: decr     General Gait Details: Increased time with cues for sequence, posture and position from RW.  Distance ltd by fatigue   Stairs             Wheelchair Mobility    Modified Rankin (Stroke Patients Only)       Balance                                            Cognition Arousal/Alertness: Awake/alert Behavior During Therapy: WFL for tasks assessed/performed Overall Cognitive Status: Within Functional Limits for tasks assessed                                 General Comments: AxO x 3 very pleasant Lady.  Lives home with a daughter "but she  works" and a GrandSon 25yo.        Exercises      General Comments        Pertinent Vitals/Pain Pain Assessment Pain Assessment: Faces Faces Pain Scale: Hurts little more Pain Location: left leg Pain Descriptors / Indicators: Aching, Sore Pain Intervention(s): Monitored during session, Premedicated before session, Repositioned    Home Living                          Prior Function            PT Goals (current goals can now be found in the care plan section) Progress towards PT goals: Progressing toward goals    Frequency    Min 1X/week      PT Plan Current plan remains appropriate    Co-evaluation              AM-PAC PT "6 Clicks" Mobility   Outcome  Measure  Help needed turning from your back to your side while in a flat bed without using bedrails?: A Lot Help needed moving from lying on your back to sitting on the side of a flat bed without using bedrails?: A Lot Help needed moving to and from a bed to a chair (including a wheelchair)?: A Lot Help needed standing up from a chair using your arms (e.g., wheelchair or bedside chair)?: A Lot Help needed to walk in hospital room?: A Lot Help needed climbing 3-5 steps with a railing? : Total 6 Click Score: 11    End of Session Equipment Utilized During Treatment: Gait belt Activity Tolerance: Patient limited by fatigue Patient left: in chair;with call bell/phone within reach;with chair alarm set Nurse Communication: Mobility status PT Visit Diagnosis: Unsteadiness on feet (R26.81);Muscle weakness (generalized) (M62.81);Difficulty in walking, not elsewhere classified (R26.2);Pain Pain - Right/Left: Left     Time: 8657-8469 PT Time Calculation (min) (ACUTE ONLY): 24 min  Charges:  $Gait Training: 8-22 mins $Therapeutic Activity: 8-22 mins                     Felecia Shelling  PTA Acute  Rehabilitation Services Office M-F          [phone]

## 2022-09-06 NOTE — Care Management Important Message (Signed)
Important Message  Patient Details IM Letter given Name: [name] MRN: [id number] Date of Birth: 17-Oct-1932   Medicare Important Message Given:  Yes     Caren Macadam 09/06/2022, 9:51 AM

## 2022-09-06 NOTE — Progress Notes (Signed)
  Progress Note   Patient: [name] ZOX:[id number] DOB: 1932/06/17 DOA: 08/31/2022     5 DOS: the patient was seen and examined on 09/06/2022   Brief hospital course: 75F with left hip pain and inability to ambulate.  Extensive imaging unrevealing.  Orthopedics involved.  Discussed with neurosurgery, no acute intervention warranted but could consider epidural steroid injection.  Orthopedics has recommended the same.  Plan for SNF.  Assessment and Plan: Severe left hip and left knee pain Atraumatic.  New onset.  Etiology unclear.  Extensive investigation with imaging unrevealing including CT thoracic, lumbar spine, pelvis; hip ultrasound, bone scan. Knee film showed arthritis.  Uric acid within normal limits.  No MRI secondary to pacemaker. CRP and ESR elevated, unclear significance Discussed with neurosurgery, could consider epidural steroid injection, no other acute intervention.  Discussed with orthopedics, concurred. Today pain is much less severe and she was able to ambulate. IR consulted for consideration of epidural steroid injection.  Plan for SNF.   CAD Permanent pacemaker placement Stable. Continue Lasix, Plavix, Coreg   Essential hypertension Remains stable. Continue home amlodipine, Coreg   CKD stage IIIb (HCC) Renal function appears to be at baseline   Type 2 diabetes mellitus with diabetic dermatitis, without long-term current use of insulin (HCC) CBG remains stable, continue sliding scale insulin.       Subjective:  Feels better Hip pain less today about 4/10 Still has knee pain Did well with PT  Physical Exam: Vitals:   09/05/22 2112 09/06/22 0454 09/06/22 0926 09/06/22 1344  BP: (!) 159/74 (!) 136/42 (!) 132/41 (!) 121/47  Pulse: 65 (!) 59 62 (!) 59  Resp: 18 18 18 18   Temp: 98.4 F (36.9 C) 98.5 F (36.9 C) 98.7 F (37.1 C) 98.4 F (36.9 C)  TempSrc: Oral Oral Oral Oral  SpO2: 100% 99% 99% 99%  Weight:      Height:       Physical Exam Vitals  reviewed.  Constitutional:      General: She is not in acute distress.    Appearance: She is not ill-appearing or toxic-appearing.     Comments: Sitting in chair  Cardiovascular:     Rate and Rhythm: Normal rate and regular rhythm.     Heart sounds: No murmur heard. Pulmonary:     Effort: Pulmonary effort is normal. No respiratory distress.     Breath sounds: No wheezing, rhonchi or rales.  Musculoskeletal:     Right lower leg: No edema.     Left lower leg: No edema.     Comments: Improved flexion left knee  Skin:    Comments: No change in skin over knee or groin, no lesions seen.  Neurological:     Mental Status: She is alert.  Psychiatric:        Mood and Affect: Mood normal.        Behavior: Behavior normal.     Data Reviewed: CBG stable  Family Communication: none  Disposition: Status is: Inpatient Remains inpatient appropriate because: plan for SNF  Planned Discharge Destination: Skilled nursing facility    Time spent: 20 minutes  Author: Brendia Sacks, MD 09/06/2022 3:55 PM  For on call review [email or web address].

## 2022-09-06 NOTE — TOC Progression Note (Signed)
Transition of Care [facility]) - Progression Note   Patient Details  Name: [name] MRN: [id number] Date of Birth: 14-May-1932  Transition [facility]) CM/SW Contact  Ewing Schlein, LCSW Phone Number: 09/06/2022, 2:05 PM  Clinical Narrative: Bed offers were provided to patient and reviewed with daughter. Daughter requested [facility] and patient agreeable to facility. CSW confirmed bed with [facility] in admissions. Per Lowella Bandy, a bed should be available tomorrow. Patient updated. CSW completed insurance authorization on [employer] portal. Reference ID # is: [id number]. Patient has been approved for 09/07/2022-09/09/2022. TOC to follow.  Expected Discharge Plan: Skilled Nursing Facility Barriers to Discharge: Continued Medical Work up  Expected Discharge Plan and Services In-house Referral: Clinical Social Work Living arrangements for the past 2 months: Apartment  Social Determinants of Health (SDOH) Interventions SDOH Screenings   Food Insecurity: No Food Insecurity (09/01/2022)  Housing: Low Risk  (09/01/2022)  Transportation Needs: No Transportation Needs (09/01/2022)  Utilities: Not At Risk (09/01/2022)  Depression (PHQ2-9): Low Risk  (07/05/2022)  Financial Resource Strain: Low Risk  (11/01/2018)  Physical Activity: Insufficiently Active (02/01/2021)  Stress: No Stress Concern Present (05/04/2021)  Tobacco Use: Medium Risk (08/31/2022)   Readmission Risk Interventions     No data to display

## 2022-09-07 DIAGNOSIS — R531 Weakness: Secondary | ICD-10-CM | POA: Diagnosis not present

## 2022-09-07 DIAGNOSIS — Z7401 Bed confinement status: Secondary | ICD-10-CM | POA: Diagnosis not present

## 2022-09-07 DIAGNOSIS — M545 Low back pain, unspecified: Secondary | ICD-10-CM | POA: Diagnosis not present

## 2022-09-07 DIAGNOSIS — E1159 Type 2 diabetes mellitus with other circulatory complications: Secondary | ICD-10-CM | POA: Diagnosis not present

## 2022-09-07 DIAGNOSIS — I129 Hypertensive chronic kidney disease with stage 1 through stage 4 chronic kidney disease, or unspecified chronic kidney disease: Secondary | ICD-10-CM | POA: Diagnosis not present

## 2022-09-07 DIAGNOSIS — Z743 Need for continuous supervision: Secondary | ICD-10-CM | POA: Diagnosis not present

## 2022-09-07 DIAGNOSIS — E1122 Type 2 diabetes mellitus with diabetic chronic kidney disease: Secondary | ICD-10-CM | POA: Diagnosis not present

## 2022-09-07 DIAGNOSIS — M48061 Spinal stenosis, lumbar region without neurogenic claudication: Secondary | ICD-10-CM | POA: Diagnosis not present

## 2022-09-07 DIAGNOSIS — N1832 Chronic kidney disease, stage 3b: Secondary | ICD-10-CM | POA: Diagnosis not present

## 2022-09-07 DIAGNOSIS — E1165 Type 2 diabetes mellitus with hyperglycemia: Secondary | ICD-10-CM | POA: Diagnosis not present

## 2022-09-07 DIAGNOSIS — I495 Sick sinus syndrome: Secondary | ICD-10-CM | POA: Diagnosis not present

## 2022-09-07 DIAGNOSIS — E559 Vitamin D deficiency, unspecified: Secondary | ICD-10-CM | POA: Diagnosis not present

## 2022-09-07 DIAGNOSIS — R2689 Other abnormalities of gait and mobility: Secondary | ICD-10-CM | POA: Diagnosis not present

## 2022-09-07 DIAGNOSIS — M25562 Pain in left knee: Secondary | ICD-10-CM | POA: Diagnosis not present

## 2022-09-07 DIAGNOSIS — D631 Anemia in chronic kidney disease: Secondary | ICD-10-CM | POA: Diagnosis not present

## 2022-09-07 DIAGNOSIS — E11620 Type 2 diabetes mellitus with diabetic dermatitis: Secondary | ICD-10-CM | POA: Diagnosis not present

## 2022-09-07 DIAGNOSIS — I10 Essential (primary) hypertension: Secondary | ICD-10-CM | POA: Diagnosis not present

## 2022-09-07 DIAGNOSIS — R251 Tremor, unspecified: Secondary | ICD-10-CM | POA: Diagnosis not present

## 2022-09-07 DIAGNOSIS — I5030 Unspecified diastolic (congestive) heart failure: Secondary | ICD-10-CM | POA: Diagnosis not present

## 2022-09-07 DIAGNOSIS — M6281 Muscle weakness (generalized): Secondary | ICD-10-CM | POA: Diagnosis not present

## 2022-09-07 DIAGNOSIS — M1009 Idiopathic gout, multiple sites: Secondary | ICD-10-CM | POA: Diagnosis not present

## 2022-09-07 DIAGNOSIS — R2681 Unsteadiness on feet: Secondary | ICD-10-CM | POA: Diagnosis not present

## 2022-09-07 DIAGNOSIS — I2510 Atherosclerotic heart disease of native coronary artery without angina pectoris: Secondary | ICD-10-CM | POA: Diagnosis not present

## 2022-09-07 DIAGNOSIS — Z950 Presence of cardiac pacemaker: Secondary | ICD-10-CM | POA: Diagnosis not present

## 2022-09-07 DIAGNOSIS — E612 Magnesium deficiency: Secondary | ICD-10-CM | POA: Diagnosis not present

## 2022-09-07 DIAGNOSIS — M25552 Pain in left hip: Secondary | ICD-10-CM | POA: Diagnosis not present

## 2022-09-07 LAB — GLUCOSE, CAPILLARY
Glucose-Capillary: 120 mg/dL — ABNORMAL HIGH (ref 70–99)
Glucose-Capillary: 174 mg/dL — ABNORMAL HIGH (ref 70–99)

## 2022-09-07 MED ORDER — PANTOPRAZOLE SODIUM 40 MG PO TBEC: 40.0000 mg | DELAYED_RELEASE_TABLET | Freq: Every day | ORAL | 0 refills | Status: DC

## 2022-09-07 NOTE — TOC Transition Note (Signed)
Transition of Care [facility]) - CM/SW Discharge Note  Patient Details  Name: [name] MRN: [id number] Date of Birth: June 25, 1932  Transition of Care [facility]) CM/SW Contact:  Ewing Schlein, LCSW Phone Number: 09/07/2022, 12:33 PM  Clinical Narrative: Patient will go to room [facility] at [facility] and the number for report is [phone]. Discharge summary, discharge orders, and SNF transfer report faxed to facility in hub. Medical necessity form done; PTAR scheduled. Patient and daughter updated regarding discharge and transportation being set up. RN updated. TOC signing off.  Final next level of care: Skilled Nursing Facility Barriers to Discharge: Barriers Resolved  Patient Goals and CMS Choice CMS Medicare.gov Compare Post Acute Care list provided to:: Patient Choice offered to / list presented to : Patient, Adult Children  Discharge Placement Existing PASRR number confirmed : 09/05/22          Patient chooses bed at: [facility] Patient to be transferred to facility by: PTAR Name of family member notified: Encarnacion Chu (daughter) Patient and family notified of of transfer: 09/07/22  Discharge Plan and Services Additional resources added to the After Visit Summary for   In-house Referral: Clinical Social Work        DME Arranged: N/A DME Agency: NA  Social Determinants of Health (SDOH) Interventions SDOH Screenings   Food Insecurity: No Food Insecurity (09/01/2022)  Housing: Low Risk  (09/01/2022)  Transportation Needs: No Transportation Needs (09/01/2022)  Utilities: Not At Risk (09/01/2022)  Depression (PHQ2-9): Low Risk  (07/05/2022)  Financial Resource Strain: Low Risk  (11/01/2018)  Physical Activity: Insufficiently Active (02/01/2021)  Stress: No Stress Concern Present (05/04/2021)  Tobacco Use: Medium Risk (08/31/2022)   Readmission Risk Interventions     No data to display

## 2022-09-07 NOTE — Plan of Care (Signed)

## 2022-09-07 NOTE — Progress Notes (Signed)
Occupational Therapy Treatment Patient Details Name: [name] MRN: [id number] DOB: [birth year] Today's Date: 09/07/2022   History of present illness Patient is a 89 year old female who was admitted from home with c/o L LE pain and lesser L UE pain. As of 09/04/22 multiple imagining showing no fx. Pt with hx of CAD, CHF, CKD, DDD, Chronic LBP, lumbar stenosis, OA, DM, CABG, sick sinus syndrome s/p pacemaker.   OT comments  Patient was able to make progress towards ADL tasks during session. Patient continues to have increased pain, decreased functional activity tolerance, decreased standing balance and decreased knowledge of AE/AD impacting participation in ADLs. Patient's discharge plan remains appropriate at this time. OT will continue to follow acutely.     Recommendations for follow up therapy are one component of a multi-disciplinary discharge planning process, led by the attending physician.  Recommendations may be updated based on patient status, additional functional criteria and insurance authorization.    Assistance Recommended at Discharge Frequent or constant Supervision/Assistance  Patient can return home with the following  A lot of help with walking and/or transfers;A lot of help with bathing/dressing/bathroom;Assistance with cooking/housework;Direct supervision/assist for medications management;Assist for transportation;Help with stairs or ramp for entrance;Direct supervision/assist for financial management   Equipment Recommendations  None recommended by OT       Precautions / Restrictions Precautions Precautions: Fall Restrictions Weight Bearing Restrictions: No       Mobility Bed Mobility Overal bed mobility: Needs Assistance Bed Mobility: Supine to Sit     Supine to sit: Mod assist     General bed mobility comments: with increased time and noted posteiror leaning wth no LOB       Balance Overall balance assessment: Needs assistance Sitting-balance  support: Feet supported, No upper extremity supported Sitting balance-Leahy Scale: Good     Standing balance support: Bilateral upper extremity supported, Reliant on assistive device for balance Standing balance-Leahy Scale: Poor                             ADL either performed or assessed with clinical judgement   ADL Overall ADL's : Needs assistance/impaired Eating/Feeding: Set up;Sitting   Grooming: Set up;Sitting Grooming Details (indicate cue type and reason): EOB Upper Body Bathing: Minimal assistance;Sitting Upper Body Bathing Details (indicate cue type and reason): cant reach her R arm pit for hygiene tasks sitting EOB     Upper Body Dressing : Minimal assistance;Sitting Upper Body Dressing Details (indicate cue type and reason): EOB     Toilet Transfer: Moderate assistance;Rolling walker (2 wheels) Toilet Transfer Details (indicate cue type and reason): to transfer from edge of bed to recliner in room with increased time.                  Cognition Arousal/Alertness: Awake/alert Behavior During Therapy: WFL for tasks assessed/performed Overall Cognitive Status: Within Functional Limits for tasks assessed           General Comments: motivated to get back home                   Pertinent Vitals/ Pain       Pain Assessment Pain Assessment: Faces Faces Pain Scale: Hurts a little bit Pain Location: legs with movement, (L big toe (like gout)) Pain Descriptors / Indicators: Aching, Sore Pain Intervention(s): Limited activity within patient's tolerance, Monitored during session         Frequency  Min 1X/week  Progress Toward Goals  OT Goals(current goals can now be found in the care plan section)  Progress towards OT goals: Progressing toward goals     Plan Discharge plan remains appropriate       AM-PAC OT "6 Clicks" Daily Activity     Outcome Measure   Help from another person eating meals?: A Little Help from  another person taking care of personal grooming?: A Little Help from another person toileting, which includes using toliet, bedpan, or urinal?: A Lot Help from another person bathing (including washing, rinsing, drying)?: A Lot Help from another person to put on and taking off regular upper body clothing?: A Little Help from another person to put on and taking off regular lower body clothing?: A Lot 6 Click Score: 15    End of Session Equipment Utilized During Treatment: Gait belt;Rolling walker (2 wheels)  OT Visit Diagnosis: Unsteadiness on feet (R26.81);Other abnormalities of gait and mobility (R26.89);History of falling (Z91.81)   Activity Tolerance Patient limited by fatigue   Patient Left in chair;with call bell/phone within reach;with family/visitor present   Nurse Communication Other (comment) (in room during session giving morning meds)        Time: 1610-9604 OT Time Calculation (min): 20 min  Charges: OT General Charges $OT Visit: 1 Visit OT Treatments $Self Care/Home Management : 8-22 mins  Rosalio Loud, MS Acute Rehabilitation Department Office# [phone]   Selinda Flavin 09/07/2022, 9:52 AM

## 2022-09-07 NOTE — Discharge Summary (Signed)
Physician Discharge Summary  [name] QIO:[id number] DOB: 06/19/1932 DOA: 08/31/2022  PCP: Donato Schultz, DO  Admit date: 08/31/2022 Discharge date: 09/07/2022  Admitted From: Home Disposition:  SNF  Recommendations for Outpatient Follow-up:  Follow up with PCP in 1-2 weeks Follow-up with outpatient spine center, phone #[phone] to discuss steroid injection, will need to be off Plavix for 5 days  Discharge Condition:Stable  CODE STATUS:DNR  Diet recommendation:  As tolerated  Brief/Interim Summary: 66F with left hip pain and inability to ambulate. Extensive imaging unrevealing. Orthopedics involved. Discussed with neurosurgery, no acute intervention warranted but could consider epidural steroid injection. Orthopedics has recommended the same. Plan for SNF.   Initial plan to administer steroid injection prior to discharge has been delayed due to ongoing administration of Plavix.  Will need to hold this medication for 5 days prior to any injection.  Patient recommended to follow-up outpatient given her improvement in symptoms no clinical indication to hold up discharge for urgently/emergently perform injection while on Plavix.  Discharge Diagnoses:  Principal Problem:   Acute pain of left hip Active Problems:   Hyperlipidemia LDL goal <70   Essential hypertension   CKD (chronic kidney disease), stage III (HCC)   Left knee pain   Type 2 diabetes mellitus with diabetic dermatitis, without long-term current use of insulin (HCC)  Severe left hip and left knee pain Atraumatic.  New onset.  Etiology unclear.  Extensive investigation with imaging unrevealing including CT thoracic, lumbar spine, pelvis; hip ultrasound, bone scan. Knee film showed arthritis.  Uric acid within normal limits.  No MRI secondary to pacemaker. CRP and ESR elevated, unclear significance Discussed with neurosurgery, could consider epidural steroid injection, no other acute intervention.  Discussed  with orthopedics, concurred. Today pain is much less severe and she was able to ambulate. IR consulted for consideration of epidural steroid injection -will need to plan outpatient. Plan for SNF for ongoing therapy until stable for discharge home with outpatient followup   CAD Permanent pacemaker placement Stable. Continue Lasix, Plavix, Coreg   Essential hypertension Remains stable. Continue home amlodipine, Coreg   CKD stage IIIb (HCC) Renal function appears to be at baseline   Type 2 diabetes mellitus with diabetic dermatitis, without longterm insulin use Poorly controlled A1c 7.7   Discharge Instructions   Allergies as of 09/07/2022       Reactions   Ciprofloxacin Nausea And Vomiting, Other (See Comments)   Syncope, also   Codeine Other (See Comments)   HALLUCINATIONS   Hydrocodone Other (See Comments)   Made her pass out   Penicillins Hives   Shellfish Allergy Hives   Diltiazem Hcl Other (See Comments)   Low heart rate   Sulfonamide Derivatives Nausea And Vomiting   Morphine And Codeine Other (See Comments)   "Went crazy"   Lovastatin    Metformin Diarrhea        Medication List     STOP taking these medications    dicyclomine 10 MG capsule Commonly known as: BENTYL   fidaxomicin 200 MG Tabs tablet Commonly known as: DIFICID   HumuLIN N KwikPen 100 UNIT/ML Kiwkpen Generic drug: Insulin NPH (Human) (Isophane)   omeprazole 20 MG capsule Commonly known as: PRILOSEC Replaced by: pantoprazole 40 MG tablet   triamcinolone cream 0.1 % Commonly known as: KENALOG       TAKE these medications    acetaminophen 500 MG tablet Commonly known as: TYLENOL Take 500 mg by mouth every 6 (six) hours as needed  for headache (pain).   allopurinol 100 MG tablet Commonly known as: ZYLOPRIM Take 100 mg by mouth at bedtime.   amLODipine 5 MG tablet Commonly known as: NORVASC Take 5 mg by mouth 2 (two) times daily.   atorvastatin 20 MG tablet Commonly known  as: LIPITOR TAKE 1 TABLET BY MOUTH ONCE DAILY   azelastine 0.05 % ophthalmic solution Commonly known as: OPTIVAR Place 1 drop into both eyes 2 (two) times daily.   BD Pen Needle Micro U/F 32G X 6 MM Misc Generic drug: Insulin Pen Needle To inject Humalin   carvedilol 12.5 MG tablet Commonly known as: COREG TAKE 1 TABLET BY MOUTH TWICE  DAILY   clopidogrel 75 MG tablet Commonly known as: PLAVIX TAKE 1 TABLET BY MOUTH  DAILY What changed:  how much to take how to take this when to take this   Entresto 97-103 MG Generic drug: sacubitril-valsartan TAKE 1 TABLET BY MOUTH TWICE  DAILY   furosemide 40 MG tablet Commonly known as: LASIX Take 1 tablet (40 mg total) by mouth daily.   multivitamin with minerals Tabs tablet Take 1 tablet by mouth at bedtime.   nitroGLYCERIN 0.4 MG SL tablet Commonly known as: Nitrostat DISSOLVE ONE TABLET UNDER THE TONGUE EVERY 5 MINUTES AS NEEDED FOR CHEST PAIN.  DO NOT EXCEED A TOTAL OF 3 DOSES IN 15 MINUTES   ONE TOUCH ULTRA TEST test strip Generic drug: glucose blood 1 each by Other route daily as needed (blood sugar).   pantoprazole 40 MG tablet Commonly known as: PROTONIX Take 1 tablet (40 mg total) by mouth daily. Replaces: omeprazole 20 MG capsule   potassium chloride 10 MEQ tablet Commonly known as: KLOR-CON Take 1 tablet (10 mEq total) by mouth daily. What changed: when to take this   tobramycin 0.3 % ophthalmic solution Commonly known as: TOBREX Place 1 drop into the left eye 4 (four) times daily.        Contact information for after-discharge care     Destination     [facility] Preferred SNF .   Service: Skilled Nursing Contact information: [address] [place] [address] [phone]                    Allergies  Allergen Reactions   Ciprofloxacin Nausea And Vomiting and Other (See Comments)    Syncope, also    Codeine Other (See Comments)    HALLUCINATIONS    Hydrocodone Other (See Comments)    Made her pass out   Penicillins Hives   Shellfish Allergy Hives   Diltiazem Hcl Other (See Comments)    Low heart rate   Sulfonamide Derivatives Nausea And Vomiting   Morphine And Codeine Other (See Comments)    "Went crazy"   Lovastatin    Metformin Diarrhea    Consultations: NeuroSx, Orthopedics   Procedures/Studies: DG Knee 1-2 Views Left  Result Date: 09/03/2022 CLINICAL DATA:  Severe knee pain radiating from left hip to left knee. EXAM: LEFT KNEE - 1-2 VIEW COMPARISON:  Left femur radiographs 08/31/2022 FINDINGS: Moderate mediolateral joint space narrowing. Mild to moderate medial and lateral compartment chondrocalcinosis. Moderate to severe patellofemoral joint space narrowing moderate superior patellar degenerative osteophytosis. Moderate chronic involutional change the quadriceps and patellar insertions on the patella. Mild-to-moderate joint effusion. Moderate atherosclerotic vascular calcifications. Surgical clips overlie the posteromedial knee. IMPRESSION: 1. Moderate to severe patellofemoral and moderate medial compartment osteoarthritis. 2. Mild-to-moderate joint effusion. Electronically Signed   By: Windy Fast  Viola M.D.   On: 09/03/2022 14:27   NM Bone Scan Whole Body  Result Date: 09/02/2022 CLINICAL DATA:  Severe hip pain. EXAM: NUCLEAR MEDICINE WHOLE BODY BONE SCAN TECHNIQUE: Whole body anterior and posterior images were obtained approximately 3 hours after intravenous injection of radiopharmaceutical. RADIOPHARMACEUTICALS:  22.0 mCi Technetium-69m MDP IV COMPARISON:  CT lumbar spine from earlier today. CT pelvis from 09/01/2022 FINDINGS: No abnormal areas of increased uptake identified to suggest osseous metastasis. There is no abnormal increased radiotracer uptake localizing to the area of concern within the left hip. Degenerative type changes are noted within the shoulders, lumbar spine and both knees. Physiologic tracer activity seen  within the kidneys an urinary bladder. IMPRESSION: 1. No abnormal increased radiotracer uptake within the left hip to explain patient's pain. 2. Degenerative type changes as above. Electronically Signed   By: Signa Kell M.D.   On: 09/02/2022 17:33   CT LUMBAR SPINE WO CONTRAST  Result Date: 09/02/2022 CLINICAL DATA:  Low back pain, increased fracture risk. EXAM: CT LUMBAR SPINE WITHOUT CONTRAST TECHNIQUE: Multidetector CT imaging of the lumbar spine was performed without intravenous contrast administration. Multiplanar CT image reconstructions were also generated. RADIATION DOSE REDUCTION: This exam was performed according to the departmental dose-optimization program which includes automated exposure control, adjustment of the mA and/or kV according to patient size and/or use of iterative reconstruction technique. COMPARISON:  CT lumbar myelogram 10/27/2005. FINDINGS: Segmentation: Conventional numbering is assumed with 5 non-rib-bearing, lumbar type vertebral bodies. Alignment: Normal. Vertebrae: No acute fracture. Degenerative Schmorl's node in the superior endplate of T11. Severe disc height loss at L3-4 with degenerative endplate sclerosis. Paraspinal and other soft tissues: Atherosclerotic calcifications of the abdominal aorta and its branches. Disc levels: Prior L2-L4 laminectomy. Disc bulge and facet arthropathy contribute to at least moderate spinal canal stenosis at the inferior adjacent L4-5 segment. IMPRESSION: 1. No acute fracture or traumatic malalignment. 2. Prior L2-L4 laminectomy. Disc bulge and facet arthropathy contribute to at least moderate spinal canal stenosis at the inferior adjacent L4-5 segment. Aortic Atherosclerosis (ICD10-I70.0). Electronically Signed   By: Orvan Falconer M.D.   On: 09/02/2022 16:01   [place] LIMITED JOINT SPACE STRUCTURES LOW LEFT  Result Date: 09/01/2022 CLINICAL DATA:  Acute left hip pain. EXAM: ULTRASOUND LEFT LOWER EXTREMITY LIMITED TECHNIQUE: Ultrasound  examination of the lower extremity soft tissues was performed in the area of clinical concern. COMPARISON:  09/01/2022. FINDINGS: Targeted scanning in the region of concern at the left hip demonstrates mild edema. No focal fluid collection is seen. IMPRESSION: Mild edema over the left hip.  No focal fluid collection is seen. Electronically Signed   By: Thornell Sartorius M.D.   On: 09/01/2022 23:55   CT Thoracic Spine Wo Contrast  Result Date: 09/01/2022 CLINICAL DATA:  Mid back pain EXAM: CT THORACIC SPINE WITHOUT CONTRAST TECHNIQUE: Multidetector CT images of the thoracic were obtained using the standard protocol without intravenous contrast. RADIATION DOSE REDUCTION: This exam was performed according to the departmental dose-optimization program which includes automated exposure control, adjustment of the mA and/or kV according to patient size and/or use of iterative reconstruction technique. COMPARISON:  None Available. FINDINGS: Alignment: Normal. Vertebrae: No acute fracture or focal pathologic process. Paraspinal and other soft tissues: Intermediate sized hiatal hernia. Calcific aortic atherosclerosis. Disc levels: No spinal canal stenosis. IMPRESSION: 1. No acute fracture or static subluxation of the thoracic spine. 2. Intermediate sized hiatal hernia. Aortic Atherosclerosis (ICD10-I70.0). Electronically Signed   By: Deatra Robinson M.D.   On:  09/01/2022 03:30   CT Head Wo Contrast  Result Date: 09/01/2022 CLINICAL DATA:  Delirium. EXAM: CT HEAD WITHOUT CONTRAST TECHNIQUE: Contiguous axial images were obtained from the base of the skull through the vertex without intravenous contrast. RADIATION DOSE REDUCTION: This exam was performed according to the departmental dose-optimization program which includes automated exposure control, adjustment of the mA and/or kV according to patient size and/or use of iterative reconstruction technique. COMPARISON:  Head CT dated 08/03/2014. FINDINGS: Evaluation of this exam  is limited due to motion artifact. Brain: Mild age-related atrophy and moderate chronic microvascular ischemic changes. There is no acute intracranial hemorrhage. No mass effect or midline shift. No extra-axial fluid collection. Vascular: No hyperdense vessel or unexpected calcification. Skull: Normal. Negative for fracture or focal lesion. Sinuses/Orbits: No acute finding. Other: None IMPRESSION: 1. No acute intracranial pathology. 2. Mild age-related atrophy and moderate chronic microvascular ischemic changes. Electronically Signed   By: Elgie Collard M.D.   On: 09/01/2022 01:41   CT PELVIS WO CONTRAST  Result Date: 09/01/2022 CLINICAL DATA:  Hip trauma, fracture suspected, aching left leg pain EXAM: CT PELVIS WITHOUT CONTRAST TECHNIQUE: Multidetector CT imaging of the pelvis was performed following the standard protocol without intravenous contrast. RADIATION DOSE REDUCTION: This exam was performed according to the departmental dose-optimization program which includes automated exposure control, adjustment of the mA and/or kV according to patient size and/or use of iterative reconstruction technique. COMPARISON:  Pelvic radiographs 08/31/2022 and CT abdomen and pelvis 03/04/2012 FINDINGS: Urinary Tract:  No abnormality visualized. Bowel:  Unremarkable visualized pelvic bowel loops. Vascular/Lymphatic: Aortic atherosclerotic calcification. No lymphadenopathy. Reproductive:  No mass or other significant abnormality Other:  None. Musculoskeletal: No acute fracture or dislocation. Degenerative changes pubic symphysis, both hips, SI joints and lower lumbar spine. Body wall edema. IMPRESSION: 1. No acute fracture or dislocation. Electronically Signed   By: Minerva Fester M.D.   On: 09/01/2022 00:39   DG FEMUR MIN 2 VIEWS LEFT  Result Date: 08/31/2022 CLINICAL DATA:  Aching left leg pain from hip to knee. Visible shortening of left leg. EXAM: PELVIS - 1-2 VIEW; LEFT FEMUR 2 VIEWS COMPARISON:  10/22/2020.  FINDINGS: There is no evidence of pelvic fracture or diastasis. No acute fracture or dislocation at the left hip or knee. There are mild tricompartmental degenerative changes of the knee. No joint effusion at the knee. Vascular calcifications are noted in the soft tissues. Degenerative changes are noted in the lower lumbar spine. Surgical clips are noted in the inguinal region on the right and in the left lower extremity. IMPRESSION: No acute fracture or dislocation. Electronically Signed   By: Thornell Sartorius M.D.   On: 08/31/2022 23:56   DG Pelvis 1-2 Views  Result Date: 08/31/2022 CLINICAL DATA:  Aching left leg pain from hip to knee. Visible shortening of left leg. EXAM: PELVIS - 1-2 VIEW; LEFT FEMUR 2 VIEWS COMPARISON:  10/22/2020. FINDINGS: There is no evidence of pelvic fracture or diastasis. No acute fracture or dislocation at the left hip or knee. There are mild tricompartmental degenerative changes of the knee. No joint effusion at the knee. Vascular calcifications are noted in the soft tissues. Degenerative changes are noted in the lower lumbar spine. Surgical clips are noted in the inguinal region on the right and in the left lower extremity. IMPRESSION: No acute fracture or dislocation. Electronically Signed   By: Thornell Sartorius M.D.   On: 08/31/2022 23:56   DG Chest 2 View  Result Date: 08/15/2022 CLINICAL DATA:  Dizziness EXAM: CHEST - 2 VIEW COMPARISON:  X-ray 07/07/2021 FINDINGS: Sternal wires. Underinflation. Left upper chest dual lead pacemaker. No consolidation, pneumothorax or effusion. Interstitial prominence is seen but could be chronic and is unchanged from previous. Overlapping cardiac leads. Degenerative changes seen of the shoulders. IMPRESSION: Postop chest with pacemaker. Stable interstitial prominence which could be chronic. Underinflation Electronically Signed   By: Karen Kays M.D.   On: 08/15/2022 17:39     Subjective: No acute issues/events overnight   Discharge  Exam: Vitals:   09/06/22 1344 09/06/22 2229  BP: (!) 121/47 (!) 159/94  Pulse: (!) 59 66  Resp: 18 16  Temp: 98.4 F (36.9 C) 97.7 F (36.5 C)  SpO2: 99% 96%   Vitals:   09/06/22 0454 09/06/22 0926 09/06/22 1344 09/06/22 2229  BP: (!) 136/42 (!) 132/41 (!) 121/47 (!) 159/94  Pulse: (!) 59 62 (!) 59 66  Resp: 18 18 18 16   Temp: 98.5 F (36.9 C) 98.7 F (37.1 C) 98.4 F (36.9 C) 97.7 F (36.5 C)  TempSrc: Oral Oral Oral Oral  SpO2: 99% 99% 99% 96%  Weight:      Height:        General: Pt is alert, awake, not in acute distress Cardiovascular: RRR, S1/S2 +, no rubs, no gallops Respiratory: CTA bilaterally, no wheezing, no rhonchi Abdominal: Soft, NT, ND, bowel sounds + Extremities: no edema, no cyanosis    The results of significant diagnostics from this hospitalization (including imaging, microbiology, ancillary and laboratory) are listed below for reference.     Microbiology: No results found for this or any previous visit (from the past 240 hour(s)).   Labs: BNP (last 3 results) No results for input(s): "BNP" in the last 8760 hours. Basic Metabolic Panel: Recent Labs  Lab 09/01/22 0106 09/02/22 0348  NA 134* 134*  K 3.9 4.2  CL 100 101  CO2 24 24  GLUCOSE 163* 141*  BUN 34* 30*  CREATININE 1.38* 1.35*  CALCIUM 8.6* 8.4*   Liver Function Tests: No results for input(s): "AST", "ALT", "ALKPHOS", "BILITOT", "PROT", "ALBUMIN" in the last 168 hours. No results for input(s): "LIPASE", "AMYLASE" in the last 168 hours. No results for input(s): "AMMONIA" in the last 168 hours. CBC: Recent Labs  Lab 09/01/22 0106 09/02/22 0348  WBC 10.4 9.5  NEUTROABS 7.3  --   HGB 12.3 10.7*  HCT 37.7 33.6*  MCV 96.4 99.4  PLT 170 159   Cardiac Enzymes: Recent Labs  Lab 09/04/22 0846  CKTOTAL 95   BNP: Invalid input(s): "POCBNP" CBG: Recent Labs  Lab 09/05/22 2101 09/06/22 0740 09/06/22 1219 09/06/22 1633 09/06/22 2234  GLUCAP 164* 126* 134* 152* 135*    D-Dimer No results for input(s): "DDIMER" in the last 72 hours. Hgb A1c No results for input(s): "HGBA1C" in the last 72 hours. Lipid Profile No results for input(s): "CHOL", "HDL", "LDLCALC", "TRIG", "CHOLHDL", "LDLDIRECT" in the last 72 hours. Thyroid function studies No results for input(s): "TSH", "T4TOTAL", "T3FREE", "THYROIDAB" in the last 72 hours.  Invalid input(s): "FREET3" Anemia work up No results for input(s): "VITAMINB12", "FOLATE", "FERRITIN", "TIBC", "IRON", "RETICCTPCT" in the last 72 hours. Urinalysis    Component Value Date/Time   COLORURINE YELLOW 09/01/2022 0223   APPEARANCEUR CLEAR 09/01/2022 0223   LABSPEC 1.015 09/01/2022 0223   PHURINE 6.5 09/01/2022 0223   GLUCOSEU NEGATIVE 09/01/2022 0223   GLUCOSEU 100 (?) 09/11/2009 0000   HGBUR NEGATIVE 09/01/2022 0223   HGBUR negative 05/11/2010 1330   BILIRUBINUR  NEGATIVE 09/01/2022 0223   KETONESUR NEGATIVE 09/01/2022 0223   PROTEINUR 30 (A) 09/01/2022 0223   UROBILINOGEN 0.2 08/03/2014 0937   NITRITE NEGATIVE 09/01/2022 0223   LEUKOCYTESUR NEGATIVE 09/01/2022 0223   Sepsis Labs Recent Labs  Lab 09/01/22 0106 09/02/22 0348  WBC 10.4 9.5   Microbiology No results found for this or any previous visit (from the past 240 hour(s)).   Time coordinating discharge: Over 30 minutes  SIGNED:   Azucena Fallen, DO [facility] 09/07/2022, 7:25 AM Pager   If 7PM-7AM, please contact night-coverage [email or web address]

## 2022-09-08 DIAGNOSIS — I2510 Atherosclerotic heart disease of native coronary artery without angina pectoris: Secondary | ICD-10-CM | POA: Diagnosis not present

## 2022-09-08 DIAGNOSIS — M6281 Muscle weakness (generalized): Secondary | ICD-10-CM | POA: Diagnosis not present

## 2022-09-08 DIAGNOSIS — M25552 Pain in left hip: Secondary | ICD-10-CM | POA: Diagnosis not present

## 2022-09-08 DIAGNOSIS — R2689 Other abnormalities of gait and mobility: Secondary | ICD-10-CM | POA: Diagnosis not present

## 2022-09-12 DIAGNOSIS — M25552 Pain in left hip: Secondary | ICD-10-CM | POA: Diagnosis not present

## 2022-09-12 DIAGNOSIS — N1832 Chronic kidney disease, stage 3b: Secondary | ICD-10-CM | POA: Diagnosis not present

## 2022-09-12 DIAGNOSIS — M6281 Muscle weakness (generalized): Secondary | ICD-10-CM | POA: Diagnosis not present

## 2022-09-12 DIAGNOSIS — R2689 Other abnormalities of gait and mobility: Secondary | ICD-10-CM | POA: Diagnosis not present

## 2022-09-12 DIAGNOSIS — E1165 Type 2 diabetes mellitus with hyperglycemia: Secondary | ICD-10-CM | POA: Diagnosis not present

## 2022-09-12 DIAGNOSIS — I495 Sick sinus syndrome: Secondary | ICD-10-CM | POA: Diagnosis not present

## 2022-09-14 ENCOUNTER — Other Ambulatory Visit: Payer: Self-pay | Admitting: *Deleted

## 2022-09-14 NOTE — Patient Outreach (Signed)
[name] resides in [place] skilled nursing facility.  Screening for potential [facility] care coordination services as a benefit of health plan and primary care provider.  Met with [employer] psychotherapist. Anticipated transition plan is to return home with family support.   Went to bedside to speak with [name]. However, she was out of the room. Left [facility] Care Management brochure along with writer's contact information at bedside table.   Will continue to follow for potential [facility] care coordination needs.   Raiford Noble, MSN, RN,BSN [facility] Post Acute Care Coordinator [phone] (Direct dial)

## 2022-09-15 DIAGNOSIS — M6281 Muscle weakness (generalized): Secondary | ICD-10-CM | POA: Diagnosis not present

## 2022-09-15 DIAGNOSIS — R2689 Other abnormalities of gait and mobility: Secondary | ICD-10-CM | POA: Diagnosis not present

## 2022-09-15 DIAGNOSIS — M25552 Pain in left hip: Secondary | ICD-10-CM | POA: Diagnosis not present

## 2022-09-16 DIAGNOSIS — E1165 Type 2 diabetes mellitus with hyperglycemia: Secondary | ICD-10-CM | POA: Diagnosis not present

## 2022-09-16 DIAGNOSIS — I5030 Unspecified diastolic (congestive) heart failure: Secondary | ICD-10-CM | POA: Diagnosis not present

## 2022-09-16 DIAGNOSIS — M25552 Pain in left hip: Secondary | ICD-10-CM | POA: Diagnosis not present

## 2022-09-19 ENCOUNTER — Other Ambulatory Visit: Payer: Medicare Other

## 2022-09-19 DIAGNOSIS — R2689 Other abnormalities of gait and mobility: Secondary | ICD-10-CM | POA: Diagnosis not present

## 2022-09-19 DIAGNOSIS — M6281 Muscle weakness (generalized): Secondary | ICD-10-CM | POA: Diagnosis not present

## 2022-09-19 DIAGNOSIS — I5030 Unspecified diastolic (congestive) heart failure: Secondary | ICD-10-CM | POA: Diagnosis not present

## 2022-09-19 DIAGNOSIS — E1165 Type 2 diabetes mellitus with hyperglycemia: Secondary | ICD-10-CM | POA: Diagnosis not present

## 2022-09-19 DIAGNOSIS — M25552 Pain in left hip: Secondary | ICD-10-CM | POA: Diagnosis not present

## 2022-09-22 ENCOUNTER — Telehealth: Payer: Self-pay | Admitting: *Deleted

## 2022-09-22 DIAGNOSIS — R2689 Other abnormalities of gait and mobility: Secondary | ICD-10-CM | POA: Diagnosis not present

## 2022-09-22 DIAGNOSIS — M25552 Pain in left hip: Secondary | ICD-10-CM | POA: Diagnosis not present

## 2022-09-22 DIAGNOSIS — M6281 Muscle weakness (generalized): Secondary | ICD-10-CM | POA: Diagnosis not present

## 2022-09-22 NOTE — Progress Notes (Signed)
  Care Coordination Note  09/22/2022 Name: [name] MRN: [id number] DOB: October 03, 1932  [name] is a 89 y.o. year old female who is a primary care patient of Donato Schultz, DO and is actively engaged with the care management team. I reached out to [employer] by phone today to assist with re-scheduling a follow up visit with the Licensed Clinical Social Worker  Follow up plan: Unsuccessful telephone outreach attempt made. A HIPAA compliant phone message was left for the patient providing contact information and requesting a return call.   Burman Nieves, CCMA Care Coordination Care Guide Direct Dial: [phone]

## 2022-09-26 DIAGNOSIS — M25552 Pain in left hip: Secondary | ICD-10-CM | POA: Diagnosis not present

## 2022-09-26 DIAGNOSIS — E1165 Type 2 diabetes mellitus with hyperglycemia: Secondary | ICD-10-CM | POA: Diagnosis not present

## 2022-09-26 DIAGNOSIS — M6281 Muscle weakness (generalized): Secondary | ICD-10-CM | POA: Diagnosis not present

## 2022-09-26 DIAGNOSIS — R2689 Other abnormalities of gait and mobility: Secondary | ICD-10-CM | POA: Diagnosis not present

## 2022-09-26 DIAGNOSIS — I5030 Unspecified diastolic (congestive) heart failure: Secondary | ICD-10-CM | POA: Diagnosis not present

## 2022-09-27 DIAGNOSIS — M25552 Pain in left hip: Secondary | ICD-10-CM | POA: Diagnosis not present

## 2022-09-27 DIAGNOSIS — M25562 Pain in left knee: Secondary | ICD-10-CM | POA: Diagnosis not present

## 2022-09-27 DIAGNOSIS — M48061 Spinal stenosis, lumbar region without neurogenic claudication: Secondary | ICD-10-CM | POA: Diagnosis not present

## 2022-09-27 DIAGNOSIS — E1159 Type 2 diabetes mellitus with other circulatory complications: Secondary | ICD-10-CM | POA: Diagnosis not present

## 2022-09-27 DIAGNOSIS — E559 Vitamin D deficiency, unspecified: Secondary | ICD-10-CM | POA: Diagnosis not present

## 2022-09-27 DIAGNOSIS — M6281 Muscle weakness (generalized): Secondary | ICD-10-CM | POA: Diagnosis not present

## 2022-09-27 DIAGNOSIS — M545 Low back pain, unspecified: Secondary | ICD-10-CM | POA: Diagnosis not present

## 2022-09-27 DIAGNOSIS — I10 Essential (primary) hypertension: Secondary | ICD-10-CM | POA: Diagnosis not present

## 2022-09-28 DIAGNOSIS — M25552 Pain in left hip: Secondary | ICD-10-CM | POA: Diagnosis not present

## 2022-09-28 DIAGNOSIS — I5030 Unspecified diastolic (congestive) heart failure: Secondary | ICD-10-CM | POA: Diagnosis not present

## 2022-09-28 DIAGNOSIS — E1165 Type 2 diabetes mellitus with hyperglycemia: Secondary | ICD-10-CM | POA: Diagnosis not present

## 2022-09-29 ENCOUNTER — Other Ambulatory Visit: Payer: Self-pay | Admitting: *Deleted

## 2022-09-29 DIAGNOSIS — I10 Essential (primary) hypertension: Secondary | ICD-10-CM

## 2022-09-29 NOTE — Patient Outreach (Signed)
Late entry for 09/28/22  Post-Acute Care Coordinator follow up. Mrs. Roehr was active with [facility] care coordination team prior to admission.   Facility site visit to [facility]. Met with [facility] [employer] [facility] psychotherapist. Mrs. Doelling to discharge on 09/29/22 to home with [facility] for PT/OT/RN services. Lives with grandson.   Spoke with Mrs. Lanphear at bedside. Discussed writer will alert [facility] care coordination team of discharge. Mrs. Zablocki states she was supposed to have a home visit assessment for caregiver services but she was in the hospital at the time.   Will send referral for care coordination services.   Raiford Noble, MSN, RN,BSN [facility] Post Acute Care Coordinator [phone] (Direct dial)

## 2022-09-30 ENCOUNTER — Telehealth: Payer: Self-pay | Admitting: *Deleted

## 2022-09-30 NOTE — Progress Notes (Signed)
  Care Coordination Note  09/30/2022 Name: [name] MRN: [id number] DOB: [birth year]  [name] Eskin is a 89 y.o. year old female who is a primary care patient of Donato Schultz, DO and is actively engaged with the care management team. I reached out to [employer] by phone today to assist with re-scheduling a follow up visit with the Licensed Clinical Social Worker  Follow up plan: Telephone appointment with care management team member scheduled for: 10/05/2022  Burman Nieves, [facility] Care Coordination Care Guide Direct Dial: [phone]

## 2022-09-30 NOTE — Progress Notes (Signed)
  Care Coordination   Note   09/30/2022 Name: [name] MRN: [id number] DOB: 02/13/1933  [name] is a 89 y.o. year old female who sees [name], Grayling Congress, DO for primary care. I reached out to [employer] by phone today to offer care coordination services.  [name] was given information about Care Coordination services today including:   The Care Coordination services include support from the care team which includes your Nurse Coordinator, Clinical Social Worker, or Pharmacist.  The Care Coordination team is here to help remove barriers to the health concerns and goals most important to you. Care Coordination services are voluntary, and the patient may decline or stop services at any time by request to their care team member.   Care Coordination Consent Status: Patient agreed to services and verbal consent obtained.   Follow up plan:  Telephone appointment with care coordination team member scheduled for:  10/07/2022  Encounter Outcome:  Pt. Scheduled from referral   Burman Nieves, [facility] Care Coordination Care Guide Direct Dial: [phone]

## 2022-10-03 ENCOUNTER — Ambulatory Visit: Payer: Medicare Other | Admitting: Family Medicine

## 2022-10-03 DIAGNOSIS — E1122 Type 2 diabetes mellitus with diabetic chronic kidney disease: Secondary | ICD-10-CM | POA: Diagnosis not present

## 2022-10-03 DIAGNOSIS — I2510 Atherosclerotic heart disease of native coronary artery without angina pectoris: Secondary | ICD-10-CM | POA: Diagnosis not present

## 2022-10-03 DIAGNOSIS — J309 Allergic rhinitis, unspecified: Secondary | ICD-10-CM | POA: Diagnosis not present

## 2022-10-03 DIAGNOSIS — M1612 Unilateral primary osteoarthritis, left hip: Secondary | ICD-10-CM | POA: Diagnosis not present

## 2022-10-03 DIAGNOSIS — I495 Sick sinus syndrome: Secondary | ICD-10-CM | POA: Diagnosis not present

## 2022-10-03 DIAGNOSIS — D631 Anemia in chronic kidney disease: Secondary | ICD-10-CM | POA: Diagnosis not present

## 2022-10-03 DIAGNOSIS — N182 Chronic kidney disease, stage 2 (mild): Secondary | ICD-10-CM | POA: Diagnosis not present

## 2022-10-03 DIAGNOSIS — M1A4720 Other secondary chronic gout, left ankle and foot, without tophus (tophi): Secondary | ICD-10-CM | POA: Diagnosis not present

## 2022-10-03 DIAGNOSIS — E039 Hypothyroidism, unspecified: Secondary | ICD-10-CM | POA: Diagnosis not present

## 2022-10-03 DIAGNOSIS — K2270 Barrett's esophagus without dysplasia: Secondary | ICD-10-CM | POA: Diagnosis not present

## 2022-10-03 DIAGNOSIS — K3184 Gastroparesis: Secondary | ICD-10-CM | POA: Diagnosis not present

## 2022-10-03 DIAGNOSIS — I5032 Chronic diastolic (congestive) heart failure: Secondary | ICD-10-CM | POA: Diagnosis not present

## 2022-10-03 DIAGNOSIS — E1151 Type 2 diabetes mellitus with diabetic peripheral angiopathy without gangrene: Secondary | ICD-10-CM | POA: Diagnosis not present

## 2022-10-03 DIAGNOSIS — I130 Hypertensive heart and chronic kidney disease with heart failure and stage 1 through stage 4 chronic kidney disease, or unspecified chronic kidney disease: Secondary | ICD-10-CM | POA: Diagnosis not present

## 2022-10-03 DIAGNOSIS — K5790 Diverticulosis of intestine, part unspecified, without perforation or abscess without bleeding: Secondary | ICD-10-CM | POA: Diagnosis not present

## 2022-10-03 DIAGNOSIS — E1165 Type 2 diabetes mellitus with hyperglycemia: Secondary | ICD-10-CM | POA: Diagnosis not present

## 2022-10-03 DIAGNOSIS — M5030 Other cervical disc degeneration, unspecified cervical region: Secondary | ICD-10-CM | POA: Diagnosis not present

## 2022-10-03 DIAGNOSIS — E1143 Type 2 diabetes mellitus with diabetic autonomic (poly)neuropathy: Secondary | ICD-10-CM | POA: Diagnosis not present

## 2022-10-03 DIAGNOSIS — E785 Hyperlipidemia, unspecified: Secondary | ICD-10-CM | POA: Diagnosis not present

## 2022-10-03 DIAGNOSIS — F32A Depression, unspecified: Secondary | ICD-10-CM | POA: Diagnosis not present

## 2022-10-03 DIAGNOSIS — K219 Gastro-esophageal reflux disease without esophagitis: Secondary | ICD-10-CM | POA: Diagnosis not present

## 2022-10-05 ENCOUNTER — Ambulatory Visit: Payer: Self-pay | Admitting: Licensed Clinical Social Worker

## 2022-10-05 DIAGNOSIS — E1143 Type 2 diabetes mellitus with diabetic autonomic (poly)neuropathy: Secondary | ICD-10-CM | POA: Diagnosis not present

## 2022-10-05 DIAGNOSIS — M1A4720 Other secondary chronic gout, left ankle and foot, without tophus (tophi): Secondary | ICD-10-CM | POA: Diagnosis not present

## 2022-10-05 DIAGNOSIS — E1151 Type 2 diabetes mellitus with diabetic peripheral angiopathy without gangrene: Secondary | ICD-10-CM | POA: Diagnosis not present

## 2022-10-05 DIAGNOSIS — M5030 Other cervical disc degeneration, unspecified cervical region: Secondary | ICD-10-CM | POA: Diagnosis not present

## 2022-10-05 DIAGNOSIS — E039 Hypothyroidism, unspecified: Secondary | ICD-10-CM | POA: Diagnosis not present

## 2022-10-05 DIAGNOSIS — K5790 Diverticulosis of intestine, part unspecified, without perforation or abscess without bleeding: Secondary | ICD-10-CM | POA: Diagnosis not present

## 2022-10-05 DIAGNOSIS — I130 Hypertensive heart and chronic kidney disease with heart failure and stage 1 through stage 4 chronic kidney disease, or unspecified chronic kidney disease: Secondary | ICD-10-CM | POA: Diagnosis not present

## 2022-10-05 DIAGNOSIS — K219 Gastro-esophageal reflux disease without esophagitis: Secondary | ICD-10-CM | POA: Diagnosis not present

## 2022-10-05 DIAGNOSIS — E1165 Type 2 diabetes mellitus with hyperglycemia: Secondary | ICD-10-CM | POA: Diagnosis not present

## 2022-10-05 DIAGNOSIS — K3184 Gastroparesis: Secondary | ICD-10-CM | POA: Diagnosis not present

## 2022-10-05 DIAGNOSIS — M1612 Unilateral primary osteoarthritis, left hip: Secondary | ICD-10-CM | POA: Diagnosis not present

## 2022-10-05 DIAGNOSIS — K2270 Barrett's esophagus without dysplasia: Secondary | ICD-10-CM | POA: Diagnosis not present

## 2022-10-05 DIAGNOSIS — F32A Depression, unspecified: Secondary | ICD-10-CM | POA: Diagnosis not present

## 2022-10-05 DIAGNOSIS — E785 Hyperlipidemia, unspecified: Secondary | ICD-10-CM | POA: Diagnosis not present

## 2022-10-05 DIAGNOSIS — I5032 Chronic diastolic (congestive) heart failure: Secondary | ICD-10-CM | POA: Diagnosis not present

## 2022-10-05 DIAGNOSIS — E1122 Type 2 diabetes mellitus with diabetic chronic kidney disease: Secondary | ICD-10-CM | POA: Diagnosis not present

## 2022-10-05 DIAGNOSIS — D631 Anemia in chronic kidney disease: Secondary | ICD-10-CM | POA: Diagnosis not present

## 2022-10-05 DIAGNOSIS — I2510 Atherosclerotic heart disease of native coronary artery without angina pectoris: Secondary | ICD-10-CM | POA: Diagnosis not present

## 2022-10-05 DIAGNOSIS — J309 Allergic rhinitis, unspecified: Secondary | ICD-10-CM | POA: Diagnosis not present

## 2022-10-05 DIAGNOSIS — I495 Sick sinus syndrome: Secondary | ICD-10-CM | POA: Diagnosis not present

## 2022-10-05 DIAGNOSIS — N182 Chronic kidney disease, stage 2 (mild): Secondary | ICD-10-CM | POA: Diagnosis not present

## 2022-10-05 NOTE — Patient Instructions (Addendum)
Social Work Visit Information  Thank you for taking time to visit with me today. Please don't hesitate to contact me if I can be of assistance to you.   Following are the goals we discussed today:   Goals Addressed             This Visit's Progress    Get Home Health Services       Activities and task to complete in order to accomplish goals.   I have contacted the In-Home Aide program [phone] at Department of Social Service you are now on the wait list I have spoken to social worker [name] I am glad Home Health Services with [facility] has started ( OT, PT and RN) per your request I will contact Dr. [name] to updated the order and add an aide. We spoke to [facility] [phone] today to get you set up for post discharge meals.  They verified that you qualify for 14 meals.           Our next appointment is by telephone on  at     Please call the care guide team at [phone] if you need to cancel or reschedule your appointment.    The patient verbalized understanding of instructions, educational materials, and care plan provided today and DECLINED offer to receive copy of patient instructions, educational materials, and care plan.     Samantha Hines, LCSW Social Work Care Coordination  [facility] [name] [phone]

## 2022-10-05 NOTE — Patient Outreach (Unsigned)
  Care Coordination  Follow Up Visit Note   10/06/2022 Name: [name] MRN: [id number] DOB: [birth year]  [name] is a 89 y.o. year old female who sees [name], Grayling Congress, DO for primary care. I spoke with  [name] by phone today.  What matters to the patients health and wellness today?    Patient was accompanied by [name] OT provider with [facility] who provided information during this encounter..  Message sent to PCP ref. Patient needing an aide added to the home health.  Also assisted with getting post discharge meals for patient and reminded her of upcoming appointments.   Goals Addressed             This Visit's Progress    Get Home Health Services       Activities and task to complete in order to accomplish goals.   I have contacted the In-Home Aide program [phone] at Department of Social Service you are now on the wait list I have spoken to social worker [name] I am glad Home Health Services with [facility] has started ( OT, PT and RN) per your request I have contacted Dr. [name] to updated the order and add an aide for you. You and I spoke to [facility] [phone] today to get you set up for post discharge meals.  They verified that you qualify for 28 meals. The first 73 will be delivered by or before July 3rd and the second set of 14 will be delivered one week later.         SDOH assessments and interventions completed:  No  Care Coordination Interventions:  Yes, provided  Interventions Today    Flowsheet Row Most Recent Value  Chronic Disease   Chronic disease during today's visit Hypertension (HTN), Congestive Heart Failure (CHF), Diabetes, Chronic Kidney Disease/End Stage Renal Disease (ESRD)  General Interventions   General Interventions Discussed/Reviewed Level of Care, Communication with, General Interventions Reviewed  Communication with --  [employer] [place] Medicare Benefits for post discharge meals]  Nutrition  Interventions   Nutrition Discussed/Reviewed Nutrition Reviewed  [family currently providing meals,  call to [facility] Medicare for post discharge meals]  Safety Interventions   Safety Discussed/Reviewed Safety Reviewed, Home Safety  Home Safety Contact home health agency  [place] provider to add an Aide to [facility]'s orders]  Advanced Directive Interventions   Advanced Directives Discussed/Reviewed Advanced Directives Discussed  [per patient her daughter has the documents and handles that for her. encouraged to bring a copy for the chart.  HPOA is [name]  [phone]]       Follow up plan: Follow up call scheduled for 30 day. Sooner if needed    Encounter Outcome:  Pt. Visit Completed   [name], LCSW Social Work Care Coordination  [facility] [name] [phone]

## 2022-10-07 ENCOUNTER — Ambulatory Visit: Payer: Self-pay

## 2022-10-07 DIAGNOSIS — E1143 Type 2 diabetes mellitus with diabetic autonomic (poly)neuropathy: Secondary | ICD-10-CM | POA: Diagnosis not present

## 2022-10-07 DIAGNOSIS — M5136 Other intervertebral disc degeneration, lumbar region: Secondary | ICD-10-CM | POA: Diagnosis not present

## 2022-10-07 DIAGNOSIS — E1122 Type 2 diabetes mellitus with diabetic chronic kidney disease: Secondary | ICD-10-CM | POA: Diagnosis not present

## 2022-10-07 DIAGNOSIS — K2270 Barrett's esophagus without dysplasia: Secondary | ICD-10-CM | POA: Diagnosis not present

## 2022-10-07 DIAGNOSIS — M8580 Other specified disorders of bone density and structure, unspecified site: Secondary | ICD-10-CM | POA: Diagnosis not present

## 2022-10-07 DIAGNOSIS — K5730 Diverticulosis of large intestine without perforation or abscess without bleeding: Secondary | ICD-10-CM | POA: Diagnosis not present

## 2022-10-07 DIAGNOSIS — K219 Gastro-esophageal reflux disease without esophagitis: Secondary | ICD-10-CM | POA: Diagnosis not present

## 2022-10-07 DIAGNOSIS — N179 Acute kidney failure, unspecified: Secondary | ICD-10-CM | POA: Diagnosis not present

## 2022-10-07 DIAGNOSIS — I2510 Atherosclerotic heart disease of native coronary artery without angina pectoris: Secondary | ICD-10-CM | POA: Diagnosis not present

## 2022-10-07 DIAGNOSIS — E039 Hypothyroidism, unspecified: Secondary | ICD-10-CM | POA: Diagnosis not present

## 2022-10-07 DIAGNOSIS — I130 Hypertensive heart and chronic kidney disease with heart failure and stage 1 through stage 4 chronic kidney disease, or unspecified chronic kidney disease: Secondary | ICD-10-CM | POA: Diagnosis not present

## 2022-10-07 DIAGNOSIS — E1151 Type 2 diabetes mellitus with diabetic peripheral angiopathy without gangrene: Secondary | ICD-10-CM | POA: Diagnosis not present

## 2022-10-07 DIAGNOSIS — D631 Anemia in chronic kidney disease: Secondary | ICD-10-CM | POA: Diagnosis not present

## 2022-10-07 DIAGNOSIS — I5032 Chronic diastolic (congestive) heart failure: Secondary | ICD-10-CM | POA: Diagnosis not present

## 2022-10-07 DIAGNOSIS — I495 Sick sinus syndrome: Secondary | ICD-10-CM | POA: Diagnosis not present

## 2022-10-07 DIAGNOSIS — F32A Depression, unspecified: Secondary | ICD-10-CM | POA: Diagnosis not present

## 2022-10-07 DIAGNOSIS — M25562 Pain in left knee: Secondary | ICD-10-CM | POA: Diagnosis not present

## 2022-10-07 DIAGNOSIS — E785 Hyperlipidemia, unspecified: Secondary | ICD-10-CM | POA: Diagnosis not present

## 2022-10-07 DIAGNOSIS — E11620 Type 2 diabetes mellitus with diabetic dermatitis: Secondary | ICD-10-CM | POA: Diagnosis not present

## 2022-10-07 DIAGNOSIS — N183 Chronic kidney disease, stage 3 unspecified: Secondary | ICD-10-CM | POA: Diagnosis not present

## 2022-10-07 DIAGNOSIS — M48061 Spinal stenosis, lumbar region without neurogenic claudication: Secondary | ICD-10-CM | POA: Diagnosis not present

## 2022-10-07 DIAGNOSIS — M1030 Gout due to renal impairment, unspecified site: Secondary | ICD-10-CM | POA: Diagnosis not present

## 2022-10-07 DIAGNOSIS — K3184 Gastroparesis: Secondary | ICD-10-CM | POA: Diagnosis not present

## 2022-10-07 NOTE — Patient Instructions (Addendum)
Visit Information  Thank you for taking time to visit with me today. Please don't hesitate to contact me if I can be of assistance to you.   Following are the goals we discussed today:  Continue to take medications as prescribed Continue to attend provider visits as scheduled Contact your provider with health questions or concerns as needed Work with home health therapist as recommended  Our next appointment is by telephone on 10/24/22 at 10:30 am  Please call the care guide team at [phone] if you need to cancel or reschedule your appointment.   If you are experiencing a Mental Health or Behavioral Health Crisis or need someone to talk to, please call the Suicide and Crisis Lifeline: 32  Kathyrn Sheriff, RN, MSN, BSN, CCM [facility] Care Coordinator [phone]

## 2022-10-07 NOTE — Patient Outreach (Signed)
  Care Coordination   Initial Visit Note   10/07/2022 Name: ARIA MATTIS MRN: [id number] DOB: May 22, 1932  [name] Speak is a 89 y.o. year old female who sees [name], Grayling Congress, DO for primary care. I spoke with  [name] by phone today.  What matters to the patients health and wellness today?  Ms William reports she is doing ok. She states daughter who is deaf and grandson live with her. She reports grandson takes her to her appointments. States meals on wheels to start soon and also reports grandchildren bring in food to eat. She reports she is active with home health. Denies any questions or concerns at this time.  Goals Addressed             This Visit's Progress    Assist with health management post rehab stay       Interventions Today    Flowsheet Row Most Recent Value  Chronic Disease   Chronic disease during today's visit Other, Hypertension (HTN), Chronic Kidney Disease/End Stage Renal Disease (ESRD)  General Interventions   General Interventions Discussed/Reviewed General Interventions Discussed, Doctor Visits  Doctor Visits Discussed/Reviewed Doctor Visits Discussed, Doctor Visits Reviewed  [reviewed upcoming scheduled appointments including appointment with neurology-provided address and contact number to neurology office. confirmed patient has transportation.]  Exercise Interventions   Exercise Discussed/Reviewed Exercise Discussed  [confimred active with home health. patient states active with [facility] and PT scheduled to see her today.]  Education Interventions   Education Provided Provided Education  Provided Verbal Education On Medication, Exercise, Other, Nutrition  [reiterated the importance of perfomring exercises between visits recommended by therapist]  Nutrition Interventions   Nutrition Discussed/Reviewed Nutrition Discussed  [encouraged to maintain good nutrition]  Pharmacy Interventions   Pharmacy Dicussed/Reviewed Pharmacy Topics Discussed   Safety Interventions   Safety Discussed/Reviewed Safety Discussed, Fall Risk            SDOH assessments and interventions completed:  No  Care Coordination Interventions:  Yes, provided   Follow up plan: Follow up call scheduled for 10/24/22    Encounter Outcome:  Pt. Visit Completed   Kathyrn Sheriff, RN, MSN, BSN, CCM [facility] Care Coordinator [phone]

## 2022-10-07 NOTE — Progress Notes (Unsigned)
Assessment/Plan:   Parkinsonism -Exam is somewhat limited by her overall pain with passive movement. I do think we should do a DaTscan.  She has a questionable allergy to shellfish, but that was when she was pregnant many years ago and she thinks maybe she had a mild rash.  I really am not particularly worried about DaT scan.  Radiology can decide if we should premedicate her.  She reports no iodine allergy. We discussed skin biopsy for alpha-synuclein.  Decided to hold on that since it is more invasive.  She is on Plavix, which does not preclude the biopsy, but it is certainly more invasive than the DaTscan.  2.  Severe acute hip and back pain.  -She was hospitalized for this in May, 2024.  She is following with neurosurgery and orthopedics.  She cannot have MRI.  CT lumbar spine with at least moderate central canal stenosis at L4-L5.  She is doing much better post rehab/SNF stay  3.  L carotid bruit  -Was going to do a carotid ultrasound, but patient reports that she knew she had this bruit and Dr. Excell Seltzer is following.  I could not find this in her chart, but she wants to hold on that for now.  She is already on Plavix.  4.  Follow-up after above is completed.  Subjective:   [name] was seen today in the movement disorders clinic for neurologic consultation at the request of Donato Schultz, *.  The consultation is for the evaluation of tremor. Pt with daughter who supplements history.  Patient developed a tremor and was concern for Parkinsons Disease.  She has recently been in the hospital, but that was really unrelated to this.  She presented to the hospital with back and leg pain.  She could not have an MRI.  Neurosurgery was going to consider an epidural injection, but she was on Plavix and the plan was to discharge her to subacute rehab and consider that as an outpatient.  The pain subsided some in rehab - "its enough now just to annoy you."  She reports that she had back  surgery with Dr. Darrelyn Hillock years ago.  Pt is R hand dominant  Tremor: Yes.     How long has it been going on? Few months  At rest or with activation?  Activation mostly  When is it noted the most?  Picking up something (cup)  Fam hx of tremor?chart said brother with Parkinsons Disease but she said that this wasn't true; no fam hx of tremor  Located where?  Bilateral UE  Affected by caffeine:  doesn't drink caffeine  Affected by alcohol:  doesn't drink alcohol  Affected by stress:  no  Affected by fatigue:  No.  Spills soup if on spoon:  may or may not (gets worse the longer she eats/holds the spoon)  Spills glass of liquid if full:  No.  Affects ADL's (tying shoes, brushing teeth, etc):  trouble with shoes just b/c she can't bend over   Other Specific Symptoms:  Voice: gotten softer Sleep:   Vivid Dreams:  No.  Acting out dreams:  No. Wet Pillows: Yes.   Postural symptoms:  Yes.   - used walker x 3 years - unsure why balance is bad  Falls?  No. Bradykinesia symptoms: slow movements and difficulty getting out of a chair; doesn't shuffle Loss of smell:  No. Loss of taste:  No. Urinary Incontinence:  No. Difficulty Swallowing:  No. Handwriting, micrographia: Yes.  Trouble with ADL's:  No.  Trouble buttoning clothing: Yes.   Depression:  No. Memory changes:  No. Hallucinations:  happened in rehab but its rare  visual distortions: Yes.   N/V:  No. Lightheaded:  Yes.    Syncope: No. Diplopia:  No. Dyskinesia:  No.  Last neuroimaging of the brain was in May, 2024 which demonstrated mild atrophy and moderate small vessel disease.   ALLERGIES:   Allergies  Allergen Reactions   Ciprofloxacin Nausea And Vomiting and Other (See Comments)    Syncope, also    Codeine Other (See Comments)    HALLUCINATIONS   Hydrocodone Other (See Comments)    Made her pass out   Penicillins Hives   Shellfish Allergy Hives   Diltiazem Hcl Other (See Comments)    Low heart rate    Sulfonamide Derivatives Nausea And Vomiting   Morphine And Codeine Other (See Comments)    "Went crazy"   Lovastatin    Metformin Diarrhea    CURRENT MEDICATIONS:  Current Outpatient Medications  Medication Instructions   acetaminophen (TYLENOL) 500 mg, Oral, Every 6 hours PRN   allopurinol (ZYLOPRIM) 100 mg, Oral, Daily at bedtime   amLODipine (NORVASC) 5 mg, Oral, 2 times daily   atorvastatin (LIPITOR) 20 MG tablet TAKE 1 TABLET BY MOUTH ONCE DAILY   azelastine (OPTIVAR) 0.05 % ophthalmic solution 1 drop, Both Eyes, 2 times daily   carvedilol (COREG) 12.5 mg, Oral, 2 times daily   clopidogrel (PLAVIX) 75 MG tablet TAKE 1 TABLET BY MOUTH  DAILY   ENTRESTO 97-103 MG TAKE 1 TABLET BY MOUTH TWICE  DAILY   furosemide (LASIX) 40 mg, Oral, Daily   Insulin Pen Needle (BD PEN NEEDLE MICRO U/F) 32G X 6 MM MISC To inject Humalin   Multiple Vitamin (MULTIVITAMIN WITH MINERALS) TABS tablet 1 tablet, Oral, Daily at bedtime   nitroGLYCERIN (NITROSTAT) 0.4 MG SL tablet DISSOLVE ONE TABLET UNDER THE TONGUE EVERY 5 MINUTES AS NEEDED FOR CHEST PAIN.  DO NOT EXCEED A TOTAL OF 3 DOSES IN 15 MINUTES   ONE TOUCH ULTRA TEST test strip 1 each, Other, Daily PRN   pantoprazole (PROTONIX) 40 mg, Oral, Daily   potassium chloride (KLOR-CON) 10 MEQ tablet 10 mEq, Oral, Daily   tobramycin (TOBREX) 0.3 % ophthalmic solution 1 drop, Left Eye, 4 times daily    Objective:   PHYSICAL EXAMINATION:    VITALS:   Vitals:   10/11/22 0939  BP: 116/72  Pulse: 60  SpO2: 99%  Weight: 186 lb (84.4 kg)  Height: 5\' 7"  (1.702 m)    GEN:  The patient appears stated age and is in NAD. HEENT:  Normocephalic, atraumatic.  The mucous membranes are moist. The superficial temporal arteries are without ropiness or tenderness. CV:  RRR Lungs:  CTAB Neck/HEME:  There are no carotid bruits bilaterally.  Neck is flexed Neurological examination:  Orientation: The patient is alert and oriented x3.  Cranial nerves: There is good  facial symmetry.  Extraocular muscles are intact. The visual fields are full to confrontational testing. The speech is fluent and hypophonic. Soft palate rises symmetrically and there is no tongue deviation. Hearing is intact to conversational tone. Sensation: Sensation is intact to light touch throughout (facial, trunk, extremities). Vibration is intact at the bilateral big toe. There is no extinction with double simultaneous stimulation.  Motor: Strength is 5/5 in the bilateral upper and lower extremities.   Shoulder shrug is equal and symmetric.  There is no pronator drift. Deep  tendon reflexes: Deep tendon reflexes are 2-/4 at the bilateral biceps, triceps, brachioradialis, patella and achilles. Plantar responses are downgoing bilaterally.  Movement examination: Tone: There is mild increased tone in the LUE and mod in the LLE.  She has trouble allowing me to move her arm on the left because of pain. Abnormal movements: there is RUE rest tremor.  There is an irregular postural tremor (appears a bit functional in nature).  This is present bilaterally, left more than right. Coordination:  There is  decremation with RAM's, with any form of RAMS, including alternating supination and pronation of the forearm, hand opening and closing, finger taps, heel taps and toe taps, esp on the L. Gait and Station: The patient requires 2 person assist out of the chair.  She is given her rollator.  She is forward flexed.  She does not shuffle.  When the walker is taken away, she feels very uncomfortable and holds tightly onto the examiner and drags the left leg.   I have reviewed and interpreted the following labs independently   Chemistry      Component Value Date/Time   NA 134 (L) 09/02/2022 0348   NA 141 05/17/2022 1355   K 4.2 09/02/2022 0348   CL 101 09/02/2022 0348   CO2 24 09/02/2022 0348   BUN 30 (H) 09/02/2022 0348   BUN 27 05/17/2022 1355   CREATININE 1.35 (H) 09/02/2022 0348   CREATININE 0.99 (H)  05/11/2015 0748      Component Value Date/Time   CALCIUM 8.4 (L) 09/02/2022 0348   ALKPHOS 93 08/15/2022 1716   AST 22 08/15/2022 1716   ALT 19 08/15/2022 1716   BILITOT 0.5 08/15/2022 1716      Lab Results  Component Value Date   TSH 4.78 03/31/2022   Lab Results  Component Value Date   WBC 9.5 09/02/2022   HGB 10.7 (L) 09/02/2022   HCT 33.6 (L) 09/02/2022   MCV 99.4 09/02/2022   PLT 159 09/02/2022      Total time spent on today's visit was 48 minutes, including both face-to-face time and nonface-to-face time.  Time included that spent on review of records (prior notes available to me/labs/imaging if pertinent), discussing treatment and goals, answering patient's questions and coordinating care.  Cc:  Donato Schultz, DO

## 2022-10-11 ENCOUNTER — Ambulatory Visit: Payer: Medicare Other | Admitting: Neurology

## 2022-10-11 ENCOUNTER — Encounter: Payer: Self-pay | Admitting: Neurology

## 2022-10-11 VITALS — BP 116/72 | HR 60 | Ht 67.0 in | Wt 186.0 lb

## 2022-10-11 DIAGNOSIS — R0989 Other specified symptoms and signs involving the circulatory and respiratory systems: Secondary | ICD-10-CM | POA: Diagnosis not present

## 2022-10-11 DIAGNOSIS — R251 Tremor, unspecified: Secondary | ICD-10-CM

## 2022-10-11 DIAGNOSIS — G20C Parkinsonism, unspecified: Secondary | ICD-10-CM | POA: Diagnosis not present

## 2022-10-14 ENCOUNTER — Ambulatory Visit (INDEPENDENT_AMBULATORY_CARE_PROVIDER_SITE_OTHER): Payer: Medicare Other | Admitting: Family Medicine

## 2022-10-14 ENCOUNTER — Encounter: Payer: Self-pay | Admitting: Family Medicine

## 2022-10-14 VITALS — BP 130/80 | HR 64 | Temp 97.7°F | Ht 67.0 in | Wt 187.2 lb

## 2022-10-14 DIAGNOSIS — E785 Hyperlipidemia, unspecified: Secondary | ICD-10-CM

## 2022-10-14 DIAGNOSIS — E11620 Type 2 diabetes mellitus with diabetic dermatitis: Secondary | ICD-10-CM | POA: Diagnosis not present

## 2022-10-14 DIAGNOSIS — M25552 Pain in left hip: Secondary | ICD-10-CM

## 2022-10-14 DIAGNOSIS — M159 Polyosteoarthritis, unspecified: Secondary | ICD-10-CM | POA: Diagnosis not present

## 2022-10-14 DIAGNOSIS — N1832 Chronic kidney disease, stage 3b: Secondary | ICD-10-CM | POA: Diagnosis not present

## 2022-10-14 DIAGNOSIS — I10 Essential (primary) hypertension: Secondary | ICD-10-CM

## 2022-10-14 DIAGNOSIS — R251 Tremor, unspecified: Secondary | ICD-10-CM | POA: Diagnosis not present

## 2022-10-14 DIAGNOSIS — I5032 Chronic diastolic (congestive) heart failure: Secondary | ICD-10-CM | POA: Diagnosis not present

## 2022-10-14 LAB — COMPREHENSIVE METABOLIC PANEL
ALT: 10 U/L (ref 0–35)
AST: 15 U/L (ref 0–37)
Albumin: 3.4 g/dL — ABNORMAL LOW (ref 3.5–5.2)
Alkaline Phosphatase: 97 U/L (ref 39–117)
BUN: 40 mg/dL — ABNORMAL HIGH (ref 6–23)
CO2: 26 mEq/L (ref 19–32)
Calcium: 8.7 mg/dL (ref 8.4–10.5)
Chloride: 104 mEq/L (ref 96–112)
Creatinine, Ser: 1.68 mg/dL — ABNORMAL HIGH (ref 0.40–1.20)
GFR: 27.09 mL/min — ABNORMAL LOW (ref >60.00)
Glucose, Bld: 164 mg/dL — ABNORMAL HIGH (ref 70–99)
Potassium: 4.1 mEq/L (ref 3.5–5.1)
Sodium: 140 mEq/L (ref 135–145)
Total Bilirubin: 0.4 mg/dL (ref 0.2–1.2)
Total Protein: 6.2 g/dL (ref 6.0–8.3)

## 2022-10-14 LAB — LIPID PANEL
Cholesterol: 121 mg/dL (ref 0–200)
HDL: 58.8 mg/dL (ref >39.00)
LDL Cholesterol: 32 mg/dL (ref 0–99)
NonHDL: 62.18
Total CHOL/HDL Ratio: 2
Triglycerides: 152 mg/dL — ABNORMAL HIGH (ref 0.0–149.0)
VLDL: 30.4 mg/dL (ref 0.0–40.0)

## 2022-10-14 LAB — CBC WITH DIFFERENTIAL/PLATELET
Basophils Absolute: 0 10*3/uL (ref 0.0–0.1)
Basophils Relative: 0.6 % (ref 0.0–3.0)
Eosinophils Absolute: 0.2 10*3/uL (ref 0.0–0.7)
Eosinophils Relative: 2.4 % (ref 0.0–5.0)
HCT: 34.3 % — ABNORMAL LOW (ref 36.0–46.0)
Hemoglobin: 11 g/dL — ABNORMAL LOW (ref 12.0–15.0)
Lymphocytes Relative: 21.4 % (ref 12.0–46.0)
Lymphs Abs: 1.7 10*3/uL (ref 0.7–4.0)
MCHC: 32.2 g/dL (ref 30.0–36.0)
MCV: 97.3 fl (ref 78.0–100.0)
Monocytes Absolute: 0.7 10*3/uL (ref 0.1–1.0)
Monocytes Relative: 9 % (ref 3.0–12.0)
Neutro Abs: 5.3 10*3/uL (ref 1.4–7.7)
Neutrophils Relative %: 66.6 % (ref 43.0–77.0)
Platelets: 201 10*3/uL (ref 150.0–400.0)
RBC: 3.52 Mil/uL — ABNORMAL LOW (ref 3.87–5.11)
RDW: 14.7 % (ref 11.5–15.5)
WBC: 7.9 10*3/uL (ref 4.0–10.5)

## 2022-10-14 LAB — HEMOGLOBIN A1C: Hgb A1c MFr Bld: 7.2 % — ABNORMAL HIGH (ref 4.6–6.5)

## 2022-10-14 MED ORDER — LANCETS MISC. MISC
1.00 | Freq: Three times a day (TID) | 0 refills | Status: AC
Start: 2022-10-14 — End: 2022-11-13

## 2022-10-14 MED ORDER — BLOOD GLUCOSE TEST VI STRP
1.00 | ORAL_STRIP | Freq: Three times a day (TID) | 0 refills | Status: AC
Start: 2022-10-14 — End: 2022-11-13

## 2022-10-14 MED ORDER — BLOOD GLUCOSE MONITORING SUPPL DEVI
1.0000 | Freq: Three times a day (TID) | 0 refills | Status: DC
Start: 2022-10-14 — End: 2023-08-29

## 2022-10-14 MED ORDER — LANCET DEVICE MISC
1.00 | Freq: Three times a day (TID) | 0 refills | Status: AC
Start: 2022-10-14 — End: 2022-11-13

## 2022-10-14 NOTE — Assessment & Plan Note (Signed)
Pt needs new glucometer

## 2022-10-14 NOTE — Assessment & Plan Note (Signed)
Pt uses tylenol prn.

## 2022-10-14 NOTE — Assessment & Plan Note (Signed)
resolved 

## 2022-10-14 NOTE — Assessment & Plan Note (Signed)
Well controlled, no changes to meds. Encouraged heart healthy diet such as the DASH diet and exercise as tolerated.  °

## 2022-10-14 NOTE — Assessment & Plan Note (Signed)
Per neuro 

## 2022-10-14 NOTE — Assessment & Plan Note (Signed)
Per nephrology 

## 2022-10-14 NOTE — Assessment & Plan Note (Signed)
Encourage heart healthy diet such as MIND or DASH diet, increase exercise, avoid trans fats, simple carbohydrates and processed foods, consider a krill or fish or flaxseed oil cap daily.  °

## 2022-10-14 NOTE — Progress Notes (Signed)
Established Patient Office Visit  Subjective   Patient ID: [name], female    DOB: February 27, 1933  Age: 89 y.o. MRN: [id number]  Chief Complaint  Patient presents with   Hospitalization Follow-up    HPI Discussed the use of AI scribe software for clinical note transcription with the patient, who gave verbal consent to proceed.  History of Present Illness   The patient, with a history of diabetes, pacemaker, and back pain, presents for a follow-up after a recent hospitalization and rehabilitation stay. They were hospitalized from May 22nd to May 29th and discharged from rehab last week. They report that they did not receive an injection during their stay. They also visited a neurologist about their tremors and are considering undergoing a test, despite a previous adverse reaction to dye.  They report a sudden onset of severe pain in their left leg and arm, which has since resolved. They are unsure of the cause of this pain. Despite the resolution of the pain, they report no improvement in their mobility.  They also express concern about a possible stroke, as they experienced sudden pain. However, a CT scan performed during their hospital stay did not show any signs of a stroke.      Patient Active Problem List   Diagnosis Date Noted   Acute pain of left hip 09/01/2022   Need for pneumococcal 20-valent conjugate vaccination 08/22/2022   Rash 05/27/2022   Tremor 05/27/2022   Coronary artery disease involving native coronary artery of native heart without angina pectoris 09/14/2021   Pain due to onychomycosis of toenails of both feet 01/15/2021   Type 2 diabetes mellitus with diabetic dermatitis, without long-term current use of insulin (HCC) 01/15/2021   Palpitations 01/15/2018   Cardiac pacemaker in situ 01/15/2018   Heel spur, left 09/13/2017   Left knee pain 08/23/2017   CKD (chronic kidney disease), stage III (HCC) 12/04/2016   Chest pain 11/23/2015   Type I (juvenile type)  diabetes mellitus with renal manifestations, not stated as uncontrolled(250.41) 04/27/2012   Insulin dependent diabetes mellitus with complications 04/27/2012   AKI (acute kidney injury) (HCC) 03/05/2012   Diarrhea 03/05/2012   Carotid artery disease (HCC) 08/11/2010   Left shoulder pain 07/08/2010   Preventative health care 07/08/2010   HOARSENESS 06/04/2010   Pruritus 05/11/2010   ANEMIA-NOS 04/02/2010   (HFpEF) heart failure with preserved ejection fraction (HCC) 02/11/2010   SINUS BRADYCARDIA 10/30/2009   ALLERGIC RHINITIS 09/11/2009   BACK PAIN 09/11/2009   MUSCLE STRAIN, RIGHT BUTTOCK 09/11/2009   SHINGLES 05/11/2009   SHOULDER PAIN, LEFT 12/29/2008   Proteinuria 12/12/2008   Abdominal pain, unspecified site 09/24/2007   Hx of CABG 05/03/2007   CHEST PAIN 04/20/2007   Dizziness 02/28/2007   ANXIETY 02/15/2007   GERD 02/15/2007   Gastroparesis 02/15/2007   DISC DISEASE, CERVICAL 02/15/2007   DISC DISEASE, LUMBAR 02/15/2007   SPINAL STENOSIS, LUMBAR 02/15/2007   PERIPHERAL EDEMA 02/15/2007   Personal History of Other Diseases of Digestive Disease 02/15/2007   Hyperlipidemia LDL goal <70 01/01/2007   GOUT 01/01/2007   Morbid obesity (HCC) 01/01/2007   DEPRESSION 01/01/2007   PERIPHERAL VASCULAR DISEASE 01/01/2007   DIVERTICULOSIS, COLON 01/01/2007   Osteoarthritis 01/01/2007   LOW BACK PAIN 01/01/2007   OSTEOPENIA 01/01/2007   Essential hypertension 10/26/2006   Past Medical History:  Diagnosis Date   Allergic rhinitis    Anemia    Anxiety    Barrett esophagus    CAD (coronary artery disease) 2009  a. Multivessel s/p PCI w/DES 2009 // b. s/p CABG 2011  //  c. LHC 8/15: pLAD 95 ISR, LCx 100, pOM1 40, dRCA 100, S-OM1/OM2 ok, S-D1 ok, S-PDA ok, L-LAD ok, EF 60%   Carotid artery disease (HCC)    a. Carotid US 9/15: RICA 1-39%; LICA 40-59% >> FU 1 year  //  b. Carotid US 9/17: R 1-39%, L 40-59% >> FU 1 year   Chronic diastolic heart failure (HCC)    CKD (chronic  kidney disease), stage II    GFR 60-89 ml/min   Depression    Disc disease, degenerative, cervical    Diverticulosis    Gastroparesis    GERD (gastroesophageal reflux disease)    Gout    H/O hiatal hernia    Helicobacter pylori gastritis    History of echocardiogram    a. Echo 11/13: EF 55% to 60%. Grade 2 diastolic dysfunction, MAC, trivial MR, mild LAE, normal RVSF, mild RAE, PASP 39 mmHg  //  b. 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   Type II or unspecified type diabetes mellitus without mention of complication, not stated as uncontrolled    Past Surgical History:  Procedure Laterality Date   ABDOMINAL HYSTERECTOMY     CARDIAC CATHETERIZATION     2011  DR COOPER (APPT NEXT WEEK)   CHOLECYSTECTOMY     CORONARY ARTERY BYPASS GRAFT  2011   LIMA-LAD, SVG-DIAG, SVG-OM1-OM2, SVG-PDA   CORONARY STENT PLACEMENT     Drug-eluting stent to the left anterior descending, circumflex and right coronary artery in Jan 2009   EYE SURGERY     BIL CATARACT REMOVAL 06/2010   LEFT HEART CATHETERIZATION WITH CORONARY ANGIOGRAM N/A 12/02/2013   Procedure: LEFT HEART CATHETERIZATION WITH CORONARY ANGIOGRAM;  Surgeon: Lesleigh Noe, MD;  Location: [facility] CATH LAB;  Service: Cardiovascular;  Laterality: N/A;   OVARIAN CYST REMOVAL     PACEMAKER INSERTION  03/06/12   MDT Adapta L implanted by Dr Johney Frame for SSS   PERMANENT PACEMAKER INSERTION N/A 03/06/2012   Procedure: PERMANENT PACEMAKER INSERTION;  Surgeon: Hillis Range, MD;  Location: [facility] CATH LAB;  Service:  Cardiovascular;  Laterality: N/A;   SHOULDER ARTHROSCOPY  06/16/2011   Procedure: ARTHROSCOPY SHOULDER;  Surgeon: Kennieth Rad, MD;  Location: [facility] OR;  Service: Orthopedics;  Laterality: Left;  LEFT SHOULDER ARTHROSCOPY ACROMIALPLASTY, POSSIBLE MINI OPEN CUFF REPAIR    TUBAL LIGATION     Social History   Tobacco Use   Smoking status: Former   Smokeless tobacco: Never   Tobacco comments:    quit 30 yrs ago  Vaping Use   Vaping Use: Never used  Substance Use Topics   Alcohol use: No   Drug use: No   Social History   Socioeconomic History   Marital status: Widowed    Spouse name: Not on file   Number of children: Not on file   Years of education: Not on file   Highest education  level: Not on file  Occupational History   Occupation: RETIRED LPN    Comment: worked as [employer]  Tobacco Use   Smoking status: Former   Smokeless tobacco: Never   Tobacco comments:    quit 30 yrs ago  Vaping Use   Vaping Use: Never used  Substance and Sexual Activity   Alcohol use: No   Drug use: No   Sexual activity: Not on file  Other Topics Concern   Not on file  Social History Narrative   Widowed 2004.., Lives with daughter and grand son ,Family history is negative for premature coronary artery disease. Mother died at age 79 with heart disease in her later years, father died at age 57 from a stroke.Marland KitchenShe  has 8 siblings, none of whom have coronary artery disease.   Social Determinants of Health   Financial Resource Strain: Low Risk  (11/01/2018)   Overall Financial Resource Strain (CARDIA)    Difficulty of Paying Living Expenses: Not hard at all  Food Insecurity: No Food Insecurity (09/01/2022)   Hunger Vital Sign    Worried About Running Out of Food in the Last Year: Never true    Ran Out of Food in the Last Year: Never true  Transportation Needs: No Transportation Needs (09/01/2022)   PRAPARE - Administrator, Civil Service (Medical): No    Lack of Transportation (Non-Medical):  No  Physical Activity: Insufficiently Active (02/01/2021)   Exercise Vital Sign    Days of Exercise per Week: 5 days    Minutes of Exercise per Session: 20 min  Stress: No Stress Concern Present (05/04/2021)   [employer] of Occupational Health - Occupational Stress Questionnaire    Feeling of Stress : Only a little  Social Connections: Not on file  Intimate Partner Violence: Not At Risk (09/01/2022)   Humiliation, Afraid, Rape, and Kick questionnaire    Fear of Current or Ex-Partner: No    Emotionally Abused: No    Physically Abused: No    Sexually Abused: No   Family Status  Relation Name Status   Mother  Deceased       Old Age   Father  Deceased   Sister  Deceased   Sister  Deceased   Brother  Alive   Brother  Alive   Brother  Deceased   Brother  Deceased   Brother  Deceased   Brother  Deceased   MGM  Deceased   MGF  Deceased   PGM  Deceased   PGF  Deceased   Daughter  Estate manager/land agent   Daughter  Alive   Son  Alive   Son  Deceased   Other  (Not Specified)   Family History  Problem Relation Age of Onset   Diabetes Mother    Hypertension Mother    Heart attack Mother    Stroke Father    Cancer Brother    Deafness Daughter    Other Son        cerebral edema at 18 months   Coronary artery disease Other    Allergies  Allergen Reactions   Ciprofloxacin Nausea And Vomiting and Other (See Comments)    Syncope, also    Codeine Other (See Comments)    HALLUCINATIONS   Hydrocodone Other (See Comments)    Made her pass out   Penicillins Hives   Shellfish Allergy Hives   Diltiazem Hcl Other (See Comments)    Low heart rate   Sulfonamide Derivatives  Nausea And Vomiting   Morphine And Codeine Other (See Comments)    "Went crazy"   Lovastatin    Metformin Diarrhea      Review of Systems  Constitutional:  Negative for fever and malaise/fatigue.  HENT:  Negative for congestion.   Eyes:  Negative for blurred vision.  Respiratory:  Negative for  shortness of breath.   Cardiovascular:  Negative for chest pain, palpitations and leg swelling.  Gastrointestinal:  Negative for abdominal pain, blood in stool and nausea.  Genitourinary:  Negative for dysuria and frequency.  Musculoskeletal:  Negative for falls.  Skin:  Negative for rash.  Neurological:  Negative for dizziness, loss of consciousness and headaches.  Endo/Heme/Allergies:  Negative for environmental allergies.  Psychiatric/Behavioral:  Negative for depression. The patient is not nervous/anxious.        Objective:     BP 130/80 (BP Location: Left Arm, Patient Position: Sitting, Cuff Size: Large)   Pulse 64   Temp 97.7 F (36.5 C) (Oral)   Ht 5\' 7"  (1.702 m)   Wt 187 lb 3.2 oz (84.9 kg)   HC 18" (45.7 cm)   SpO2 96%   BMI 29.32 kg/m  BP Readings from Last 3 Encounters:  10/14/22 130/80  10/11/22 116/72  09/07/22 (!) 125/45   Wt Readings from Last 3 Encounters:  10/14/22 187 lb 3.2 oz (84.9 kg)  10/11/22 186 lb (84.4 kg)  08/31/22 183 lb (83 kg)   SpO2 Readings from Last 3 Encounters:  10/14/22 96%  10/11/22 99%  09/07/22 100%      Physical Exam Vitals and nursing note reviewed.  Constitutional:      General: She is not in acute distress.    Appearance: Normal appearance. She is well-developed.  HENT:     Head: Normocephalic and atraumatic.  Eyes:     General: No scleral icterus.       Right eye: No discharge.        Left eye: No discharge.  Cardiovascular:     Rate and Rhythm: Normal rate and regular rhythm.     Heart sounds: No murmur heard. Pulmonary:     Effort: Pulmonary effort is normal. No respiratory distress.     Breath sounds: Normal breath sounds.  Musculoskeletal:        General: Normal range of motion.     Cervical back: Normal range of motion and neck supple.     Right lower leg: No edema.     Left lower leg: No edema.  Skin:    General: Skin is warm and dry.  Neurological:     General: No focal deficit present.     Mental  Status: She is alert and oriented to person, place, and time.  Psychiatric:        Mood and Affect: Mood normal.        Behavior: Behavior normal.        Thought Content: Thought content normal.        Judgment: Judgment normal.      No results found for any visits on 10/14/22.  Last CBC Lab Results  Component Value Date   WBC 9.5 09/02/2022   HGB 10.7 (L) 09/02/2022   HCT 33.6 (L) 09/02/2022   MCV 99.4 09/02/2022   MCH 31.7 09/02/2022   RDW 12.8 09/02/2022   PLT 159 09/02/2022   Last metabolic panel Lab Results  Component Value Date   GLUCOSE 141 (H) 09/02/2022   NA 134 (L) 09/02/2022  K 4.2 09/02/2022   CL 101 09/02/2022   CO2 24 09/02/2022   BUN 30 (H) 09/02/2022   CREATININE 1.35 (H) 09/02/2022   GFRNONAA 38 (L) 09/02/2022   CALCIUM 8.4 (L) 09/02/2022   PHOS 3.0 03/06/2012   PROT 6.6 08/15/2022   ALBUMIN 3.3 (L) 08/15/2022   BILITOT 0.5 08/15/2022   ALKPHOS 93 08/15/2022   AST 22 08/15/2022   ALT 19 08/15/2022   ANIONGAP 9 09/02/2022   Last lipids Lab Results  Component Value Date   CHOL 113 07/05/2022   HDL 63.90 07/05/2022   LDLCALC 26 07/05/2022   LDLDIRECT 82.5 09/11/2009   TRIG 114.0 07/05/2022   CHOLHDL 2 07/05/2022   Last hemoglobin A1c Lab Results  Component Value Date   HGBA1C 7.7 (H) 07/05/2022   Last thyroid functions Lab Results  Component Value Date   TSH 4.78 03/31/2022   Last vitamin D Lab Results  Component Value Date   VD25OH 41.98 03/31/2022      The ASCVD Risk score (Arnett DK, et al., 2019) failed to calculate for the following reasons:   The 2019 ASCVD risk score is only valid for ages 84 to 77    Assessment & Plan:   Problem List Items Addressed This Visit       Unprioritized   Type 2 diabetes mellitus with diabetic dermatitis, without long-term current use of insulin (HCC) - Primary   Relevant Medications   Blood Glucose Monitoring Suppl DEVI   Glucose Blood (BLOOD GLUCOSE TEST STRIPS) STRP   Lancet  Device MISC   Lancets Misc. MISC   Other Relevant Orders   Lipid panel   CBC with Differential/Platelet   Comprehensive metabolic panel   Hemoglobin A1c  Assessment and Plan   Assessment and Plan    Recent Hospitalization and Rehabilitation: Discharged last week. No specific diagnosis discussed. -Continue current care plan.  Tremors: Neurologist consultation completed. Considering further testing with a scan. Patient has a history of adverse reaction to dye. -Consider premedication to prevent adverse reaction to dye for the scan.  Back Pain: Resolved spontaneously. No current pain. -No further action required at this time.  Mobility Issues: Difficulty walking, no pain. Home health care and physical therapy arranged. -Continue with home health care and physical therapy.  Diabetes: Blood glucose monitoring not currently possible due to broken meter. -Order new glucose meter. -Check blood glucose levels in lab today.  Kidney Disease: Upcoming appointment with nephrologist. -Continue with planned nephrology appointment.  General Health Maintenance: -Check blood pressure today. -Complete blood work today. -Ensure patient has adequate food supply at home.                No follow-ups on file.    Donato Schultz, DO

## 2022-10-14 NOTE — Assessment & Plan Note (Signed)
Per cardiology 

## 2022-10-15 ENCOUNTER — Other Ambulatory Visit: Payer: Self-pay | Admitting: Cardiovascular Disease

## 2022-10-17 ENCOUNTER — Telehealth: Payer: Self-pay

## 2022-10-17 DIAGNOSIS — N179 Acute kidney failure, unspecified: Secondary | ICD-10-CM | POA: Diagnosis not present

## 2022-10-17 DIAGNOSIS — E1122 Type 2 diabetes mellitus with diabetic chronic kidney disease: Secondary | ICD-10-CM | POA: Diagnosis not present

## 2022-10-17 DIAGNOSIS — F32A Depression, unspecified: Secondary | ICD-10-CM | POA: Diagnosis not present

## 2022-10-17 DIAGNOSIS — K2270 Barrett's esophagus without dysplasia: Secondary | ICD-10-CM | POA: Diagnosis not present

## 2022-10-17 DIAGNOSIS — I130 Hypertensive heart and chronic kidney disease with heart failure and stage 1 through stage 4 chronic kidney disease, or unspecified chronic kidney disease: Secondary | ICD-10-CM | POA: Diagnosis not present

## 2022-10-17 DIAGNOSIS — M48061 Spinal stenosis, lumbar region without neurogenic claudication: Secondary | ICD-10-CM | POA: Diagnosis not present

## 2022-10-17 DIAGNOSIS — E1151 Type 2 diabetes mellitus with diabetic peripheral angiopathy without gangrene: Secondary | ICD-10-CM | POA: Diagnosis not present

## 2022-10-17 DIAGNOSIS — M25562 Pain in left knee: Secondary | ICD-10-CM | POA: Diagnosis not present

## 2022-10-17 DIAGNOSIS — D631 Anemia in chronic kidney disease: Secondary | ICD-10-CM | POA: Diagnosis not present

## 2022-10-17 DIAGNOSIS — N183 Chronic kidney disease, stage 3 unspecified: Secondary | ICD-10-CM | POA: Diagnosis not present

## 2022-10-17 DIAGNOSIS — I495 Sick sinus syndrome: Secondary | ICD-10-CM | POA: Diagnosis not present

## 2022-10-17 DIAGNOSIS — K5730 Diverticulosis of large intestine without perforation or abscess without bleeding: Secondary | ICD-10-CM | POA: Diagnosis not present

## 2022-10-17 DIAGNOSIS — E039 Hypothyroidism, unspecified: Secondary | ICD-10-CM | POA: Diagnosis not present

## 2022-10-17 DIAGNOSIS — E785 Hyperlipidemia, unspecified: Secondary | ICD-10-CM | POA: Diagnosis not present

## 2022-10-17 DIAGNOSIS — I2510 Atherosclerotic heart disease of native coronary artery without angina pectoris: Secondary | ICD-10-CM | POA: Diagnosis not present

## 2022-10-17 DIAGNOSIS — K3184 Gastroparesis: Secondary | ICD-10-CM | POA: Diagnosis not present

## 2022-10-17 DIAGNOSIS — M1030 Gout due to renal impairment, unspecified site: Secondary | ICD-10-CM | POA: Diagnosis not present

## 2022-10-17 DIAGNOSIS — M5136 Other intervertebral disc degeneration, lumbar region: Secondary | ICD-10-CM | POA: Diagnosis not present

## 2022-10-17 DIAGNOSIS — E1143 Type 2 diabetes mellitus with diabetic autonomic (poly)neuropathy: Secondary | ICD-10-CM | POA: Diagnosis not present

## 2022-10-17 DIAGNOSIS — K219 Gastro-esophageal reflux disease without esophagitis: Secondary | ICD-10-CM | POA: Diagnosis not present

## 2022-10-17 DIAGNOSIS — I5032 Chronic diastolic (congestive) heart failure: Secondary | ICD-10-CM | POA: Diagnosis not present

## 2022-10-17 DIAGNOSIS — M8580 Other specified disorders of bone density and structure, unspecified site: Secondary | ICD-10-CM | POA: Diagnosis not present

## 2022-10-17 DIAGNOSIS — E11620 Type 2 diabetes mellitus with diabetic dermatitis: Secondary | ICD-10-CM | POA: Diagnosis not present

## 2022-10-17 NOTE — Telephone Encounter (Signed)
I was able to finish PA today for code [id number] Berkley Harvey # is [id number] and is valid from October 17 2022 - Dec 01 2022 Case number is [id number]

## 2022-10-19 ENCOUNTER — Other Ambulatory Visit: Payer: Self-pay | Admitting: Family Medicine

## 2022-10-20 DIAGNOSIS — M48061 Spinal stenosis, lumbar region without neurogenic claudication: Secondary | ICD-10-CM | POA: Diagnosis not present

## 2022-10-20 DIAGNOSIS — E039 Hypothyroidism, unspecified: Secondary | ICD-10-CM | POA: Diagnosis not present

## 2022-10-20 DIAGNOSIS — N183 Chronic kidney disease, stage 3 unspecified: Secondary | ICD-10-CM | POA: Diagnosis not present

## 2022-10-20 DIAGNOSIS — D631 Anemia in chronic kidney disease: Secondary | ICD-10-CM | POA: Diagnosis not present

## 2022-10-20 DIAGNOSIS — E785 Hyperlipidemia, unspecified: Secondary | ICD-10-CM | POA: Diagnosis not present

## 2022-10-20 DIAGNOSIS — K3184 Gastroparesis: Secondary | ICD-10-CM | POA: Diagnosis not present

## 2022-10-20 DIAGNOSIS — E1151 Type 2 diabetes mellitus with diabetic peripheral angiopathy without gangrene: Secondary | ICD-10-CM | POA: Diagnosis not present

## 2022-10-20 DIAGNOSIS — K5730 Diverticulosis of large intestine without perforation or abscess without bleeding: Secondary | ICD-10-CM | POA: Diagnosis not present

## 2022-10-20 DIAGNOSIS — K2270 Barrett's esophagus without dysplasia: Secondary | ICD-10-CM | POA: Diagnosis not present

## 2022-10-20 DIAGNOSIS — K219 Gastro-esophageal reflux disease without esophagitis: Secondary | ICD-10-CM | POA: Diagnosis not present

## 2022-10-20 DIAGNOSIS — I495 Sick sinus syndrome: Secondary | ICD-10-CM | POA: Diagnosis not present

## 2022-10-20 DIAGNOSIS — M5136 Other intervertebral disc degeneration, lumbar region: Secondary | ICD-10-CM | POA: Diagnosis not present

## 2022-10-20 DIAGNOSIS — I130 Hypertensive heart and chronic kidney disease with heart failure and stage 1 through stage 4 chronic kidney disease, or unspecified chronic kidney disease: Secondary | ICD-10-CM | POA: Diagnosis not present

## 2022-10-20 DIAGNOSIS — F32A Depression, unspecified: Secondary | ICD-10-CM | POA: Diagnosis not present

## 2022-10-20 DIAGNOSIS — M25562 Pain in left knee: Secondary | ICD-10-CM | POA: Diagnosis not present

## 2022-10-20 DIAGNOSIS — I5032 Chronic diastolic (congestive) heart failure: Secondary | ICD-10-CM | POA: Diagnosis not present

## 2022-10-20 DIAGNOSIS — I2510 Atherosclerotic heart disease of native coronary artery without angina pectoris: Secondary | ICD-10-CM | POA: Diagnosis not present

## 2022-10-20 DIAGNOSIS — E1122 Type 2 diabetes mellitus with diabetic chronic kidney disease: Secondary | ICD-10-CM | POA: Diagnosis not present

## 2022-10-20 DIAGNOSIS — E11620 Type 2 diabetes mellitus with diabetic dermatitis: Secondary | ICD-10-CM | POA: Diagnosis not present

## 2022-10-20 DIAGNOSIS — M1612 Unilateral primary osteoarthritis, left hip: Secondary | ICD-10-CM | POA: Diagnosis not present

## 2022-10-20 DIAGNOSIS — E1143 Type 2 diabetes mellitus with diabetic autonomic (poly)neuropathy: Secondary | ICD-10-CM | POA: Diagnosis not present

## 2022-10-20 DIAGNOSIS — M1030 Gout due to renal impairment, unspecified site: Secondary | ICD-10-CM | POA: Diagnosis not present

## 2022-10-20 DIAGNOSIS — M8580 Other specified disorders of bone density and structure, unspecified site: Secondary | ICD-10-CM | POA: Diagnosis not present

## 2022-10-21 DIAGNOSIS — H353111 Nonexudative age-related macular degeneration, right eye, early dry stage: Secondary | ICD-10-CM | POA: Diagnosis not present

## 2022-10-21 DIAGNOSIS — H353221 Exudative age-related macular degeneration, left eye, with active choroidal neovascularization: Secondary | ICD-10-CM | POA: Diagnosis not present

## 2022-10-21 DIAGNOSIS — E113293 Type 2 diabetes mellitus with mild nonproliferative diabetic retinopathy without macular edema, bilateral: Secondary | ICD-10-CM | POA: Diagnosis not present

## 2022-10-24 ENCOUNTER — Ambulatory Visit: Payer: Self-pay

## 2022-10-24 DIAGNOSIS — N183 Chronic kidney disease, stage 3 unspecified: Secondary | ICD-10-CM | POA: Diagnosis not present

## 2022-10-24 DIAGNOSIS — D631 Anemia in chronic kidney disease: Secondary | ICD-10-CM | POA: Diagnosis not present

## 2022-10-24 DIAGNOSIS — M48061 Spinal stenosis, lumbar region without neurogenic claudication: Secondary | ICD-10-CM | POA: Diagnosis not present

## 2022-10-24 DIAGNOSIS — E1151 Type 2 diabetes mellitus with diabetic peripheral angiopathy without gangrene: Secondary | ICD-10-CM | POA: Diagnosis not present

## 2022-10-24 DIAGNOSIS — F32A Depression, unspecified: Secondary | ICD-10-CM | POA: Diagnosis not present

## 2022-10-24 DIAGNOSIS — K5730 Diverticulosis of large intestine without perforation or abscess without bleeding: Secondary | ICD-10-CM | POA: Diagnosis not present

## 2022-10-24 DIAGNOSIS — E11620 Type 2 diabetes mellitus with diabetic dermatitis: Secondary | ICD-10-CM | POA: Diagnosis not present

## 2022-10-24 DIAGNOSIS — M8580 Other specified disorders of bone density and structure, unspecified site: Secondary | ICD-10-CM | POA: Diagnosis not present

## 2022-10-24 DIAGNOSIS — K2270 Barrett's esophagus without dysplasia: Secondary | ICD-10-CM | POA: Diagnosis not present

## 2022-10-24 DIAGNOSIS — E1143 Type 2 diabetes mellitus with diabetic autonomic (poly)neuropathy: Secondary | ICD-10-CM | POA: Diagnosis not present

## 2022-10-24 DIAGNOSIS — M1612 Unilateral primary osteoarthritis, left hip: Secondary | ICD-10-CM | POA: Diagnosis not present

## 2022-10-24 DIAGNOSIS — I130 Hypertensive heart and chronic kidney disease with heart failure and stage 1 through stage 4 chronic kidney disease, or unspecified chronic kidney disease: Secondary | ICD-10-CM | POA: Diagnosis not present

## 2022-10-24 DIAGNOSIS — K219 Gastro-esophageal reflux disease without esophagitis: Secondary | ICD-10-CM | POA: Diagnosis not present

## 2022-10-24 DIAGNOSIS — I5032 Chronic diastolic (congestive) heart failure: Secondary | ICD-10-CM | POA: Diagnosis not present

## 2022-10-24 DIAGNOSIS — E785 Hyperlipidemia, unspecified: Secondary | ICD-10-CM | POA: Diagnosis not present

## 2022-10-24 DIAGNOSIS — I2510 Atherosclerotic heart disease of native coronary artery without angina pectoris: Secondary | ICD-10-CM | POA: Diagnosis not present

## 2022-10-24 DIAGNOSIS — E039 Hypothyroidism, unspecified: Secondary | ICD-10-CM | POA: Diagnosis not present

## 2022-10-24 DIAGNOSIS — K3184 Gastroparesis: Secondary | ICD-10-CM | POA: Diagnosis not present

## 2022-10-24 DIAGNOSIS — I495 Sick sinus syndrome: Secondary | ICD-10-CM | POA: Diagnosis not present

## 2022-10-24 DIAGNOSIS — M1030 Gout due to renal impairment, unspecified site: Secondary | ICD-10-CM | POA: Diagnosis not present

## 2022-10-24 DIAGNOSIS — M25562 Pain in left knee: Secondary | ICD-10-CM | POA: Diagnosis not present

## 2022-10-24 DIAGNOSIS — M5136 Other intervertebral disc degeneration, lumbar region: Secondary | ICD-10-CM | POA: Diagnosis not present

## 2022-10-24 DIAGNOSIS — E1122 Type 2 diabetes mellitus with diabetic chronic kidney disease: Secondary | ICD-10-CM | POA: Diagnosis not present

## 2022-10-24 NOTE — Patient Outreach (Signed)
  Care Coordination   Follow Up Visit Note   10/24/2022 Name: [name] MRN: [id number] DOB: May 03, 1932  [name] is a 89 y.o. year old female who sees [name], Samantha Congress, DO for primary care. I spoke with  [name] by phone today.  What matters to the patients health and wellness today?  [name] reports she is doing ok. Confirmed upcoming appointments with patient. Patient reports she has lost her SCAT card, but states she has  transportation to her cardiologist appointment. Patient reports she has received new glucose meter, but she does not know how to set it up. Patient unsure if [facility] is still involved. RNCM contacted [facility] and was told that she was discharged the beginning of July.  Goals Addressed             This Visit's Progress    Assist with health management post rehab stay       Interventions Today    Flowsheet Row Most Recent Value  Chronic Disease   Chronic disease during today's visit Diabetes, Hypertension (HTN), Chronic Kidney Disease/End Stage Renal Disease (ESRD)  General Interventions   General Interventions Discussed/Reviewed General Interventions Reviewed, Doctor Visits, Communication with  Doctor Visits Discussed/Reviewed Doctor Visits Discussed, PCP, Specialist  [facility] confirmed patient has transportation to cardiology appointment.]  PCP/Specialist Visits Compliance with follow-up visit  Communication with PCP/Specialists, Social Work  [employer] PCP-address education for glucose meter. update LCSW regarding SCAT passes]  Education Interventions   Education Provided Provided Education  Provided Verbal Education On Insurance Plans  [advsised to continue to take medications as prescribed,  attend provider visits as recommended,  discussed OTC benefits and encouraged to call insurance re: scales.]  Nutrition Interventions   Nutrition Discussed/Reviewed Nutrition Reviewed  [encouraged to eat healthy. confirmed patient has food in the  home]  Pharmacy Interventions   Pharmacy Dicussed/Reviewed Pharmacy Topics Reviewed  Safety Interventions   Safety Discussed/Reviewed Fall Risk, Safety Reviewed  Home Safety Contact home health agency  [spoke with [facility] who reports patient was dischaged the beginning of July.]            SDOH assessments and interventions completed:  No  Care Coordination Interventions:  Yes, provided   Follow up plan: Follow up call scheduled for 11/25/22    Encounter Outcome:  Pt. Visit Completed   Samantha Sheriff, RN, MSN, BSN, CCM [facility] Care Coordinator [phone]

## 2022-10-24 NOTE — Patient Instructions (Addendum)
Visit Information  Thank you for taking time to visit with me today. Please don't hesitate to contact me if I can be of assistance to you.   Following are the goals we discussed today:  Continue to take medications as prescribed Attend provider appointments as scheduled Contact provider with Health questions or concerns   Our next appointment is by telephone on 11/25/22 at 9:30 am  Please call the care guide team at [phone] if you need to cancel or reschedule your appointment.   If you are experiencing a Mental Health or Behavioral Health Crisis or need someone to talk to, please call the Suicide and Crisis Lifeline: 45  Kathyrn Sheriff, RN, MSN, BSN, CCM [facility] Care Coordinator [phone]

## 2022-10-25 DIAGNOSIS — K219 Gastro-esophageal reflux disease without esophagitis: Secondary | ICD-10-CM | POA: Diagnosis not present

## 2022-10-25 DIAGNOSIS — K5730 Diverticulosis of large intestine without perforation or abscess without bleeding: Secondary | ICD-10-CM | POA: Diagnosis not present

## 2022-10-25 DIAGNOSIS — F32A Depression, unspecified: Secondary | ICD-10-CM | POA: Diagnosis not present

## 2022-10-25 DIAGNOSIS — E039 Hypothyroidism, unspecified: Secondary | ICD-10-CM | POA: Diagnosis not present

## 2022-10-25 DIAGNOSIS — K3184 Gastroparesis: Secondary | ICD-10-CM | POA: Diagnosis not present

## 2022-10-25 DIAGNOSIS — M8580 Other specified disorders of bone density and structure, unspecified site: Secondary | ICD-10-CM | POA: Diagnosis not present

## 2022-10-25 DIAGNOSIS — K2270 Barrett's esophagus without dysplasia: Secondary | ICD-10-CM | POA: Diagnosis not present

## 2022-10-25 DIAGNOSIS — M1612 Unilateral primary osteoarthritis, left hip: Secondary | ICD-10-CM | POA: Diagnosis not present

## 2022-10-25 DIAGNOSIS — M5136 Other intervertebral disc degeneration, lumbar region: Secondary | ICD-10-CM | POA: Diagnosis not present

## 2022-10-25 DIAGNOSIS — D631 Anemia in chronic kidney disease: Secondary | ICD-10-CM | POA: Diagnosis not present

## 2022-10-25 DIAGNOSIS — M25562 Pain in left knee: Secondary | ICD-10-CM | POA: Diagnosis not present

## 2022-10-25 DIAGNOSIS — I495 Sick sinus syndrome: Secondary | ICD-10-CM | POA: Diagnosis not present

## 2022-10-25 DIAGNOSIS — M1030 Gout due to renal impairment, unspecified site: Secondary | ICD-10-CM | POA: Diagnosis not present

## 2022-10-25 DIAGNOSIS — I5032 Chronic diastolic (congestive) heart failure: Secondary | ICD-10-CM | POA: Diagnosis not present

## 2022-10-25 DIAGNOSIS — E1122 Type 2 diabetes mellitus with diabetic chronic kidney disease: Secondary | ICD-10-CM | POA: Diagnosis not present

## 2022-10-25 DIAGNOSIS — E785 Hyperlipidemia, unspecified: Secondary | ICD-10-CM | POA: Diagnosis not present

## 2022-10-25 DIAGNOSIS — E1151 Type 2 diabetes mellitus with diabetic peripheral angiopathy without gangrene: Secondary | ICD-10-CM | POA: Diagnosis not present

## 2022-10-25 DIAGNOSIS — E11620 Type 2 diabetes mellitus with diabetic dermatitis: Secondary | ICD-10-CM | POA: Diagnosis not present

## 2022-10-25 DIAGNOSIS — E1143 Type 2 diabetes mellitus with diabetic autonomic (poly)neuropathy: Secondary | ICD-10-CM | POA: Diagnosis not present

## 2022-10-25 DIAGNOSIS — I130 Hypertensive heart and chronic kidney disease with heart failure and stage 1 through stage 4 chronic kidney disease, or unspecified chronic kidney disease: Secondary | ICD-10-CM | POA: Diagnosis not present

## 2022-10-25 DIAGNOSIS — M48061 Spinal stenosis, lumbar region without neurogenic claudication: Secondary | ICD-10-CM | POA: Diagnosis not present

## 2022-10-25 DIAGNOSIS — N183 Chronic kidney disease, stage 3 unspecified: Secondary | ICD-10-CM | POA: Diagnosis not present

## 2022-10-25 DIAGNOSIS — I2510 Atherosclerotic heart disease of native coronary artery without angina pectoris: Secondary | ICD-10-CM | POA: Diagnosis not present

## 2022-10-27 DIAGNOSIS — E1122 Type 2 diabetes mellitus with diabetic chronic kidney disease: Secondary | ICD-10-CM | POA: Diagnosis not present

## 2022-10-27 DIAGNOSIS — K2270 Barrett's esophagus without dysplasia: Secondary | ICD-10-CM | POA: Diagnosis not present

## 2022-10-27 DIAGNOSIS — D631 Anemia in chronic kidney disease: Secondary | ICD-10-CM | POA: Diagnosis not present

## 2022-10-27 DIAGNOSIS — M8580 Other specified disorders of bone density and structure, unspecified site: Secondary | ICD-10-CM | POA: Diagnosis not present

## 2022-10-27 DIAGNOSIS — I130 Hypertensive heart and chronic kidney disease with heart failure and stage 1 through stage 4 chronic kidney disease, or unspecified chronic kidney disease: Secondary | ICD-10-CM | POA: Diagnosis not present

## 2022-10-27 DIAGNOSIS — N183 Chronic kidney disease, stage 3 unspecified: Secondary | ICD-10-CM | POA: Diagnosis not present

## 2022-10-27 DIAGNOSIS — E1143 Type 2 diabetes mellitus with diabetic autonomic (poly)neuropathy: Secondary | ICD-10-CM | POA: Diagnosis not present

## 2022-10-27 DIAGNOSIS — I2510 Atherosclerotic heart disease of native coronary artery without angina pectoris: Secondary | ICD-10-CM | POA: Diagnosis not present

## 2022-10-27 DIAGNOSIS — M5136 Other intervertebral disc degeneration, lumbar region: Secondary | ICD-10-CM | POA: Diagnosis not present

## 2022-10-27 DIAGNOSIS — M48061 Spinal stenosis, lumbar region without neurogenic claudication: Secondary | ICD-10-CM | POA: Diagnosis not present

## 2022-10-27 DIAGNOSIS — M25562 Pain in left knee: Secondary | ICD-10-CM | POA: Diagnosis not present

## 2022-10-27 DIAGNOSIS — M1030 Gout due to renal impairment, unspecified site: Secondary | ICD-10-CM | POA: Diagnosis not present

## 2022-10-27 DIAGNOSIS — E11620 Type 2 diabetes mellitus with diabetic dermatitis: Secondary | ICD-10-CM | POA: Diagnosis not present

## 2022-10-27 DIAGNOSIS — F32A Depression, unspecified: Secondary | ICD-10-CM | POA: Diagnosis not present

## 2022-10-27 DIAGNOSIS — E1151 Type 2 diabetes mellitus with diabetic peripheral angiopathy without gangrene: Secondary | ICD-10-CM | POA: Diagnosis not present

## 2022-10-27 DIAGNOSIS — K3184 Gastroparesis: Secondary | ICD-10-CM | POA: Diagnosis not present

## 2022-10-27 DIAGNOSIS — E039 Hypothyroidism, unspecified: Secondary | ICD-10-CM | POA: Diagnosis not present

## 2022-10-27 DIAGNOSIS — K219 Gastro-esophageal reflux disease without esophagitis: Secondary | ICD-10-CM | POA: Diagnosis not present

## 2022-10-27 DIAGNOSIS — I495 Sick sinus syndrome: Secondary | ICD-10-CM | POA: Diagnosis not present

## 2022-10-27 DIAGNOSIS — E785 Hyperlipidemia, unspecified: Secondary | ICD-10-CM | POA: Diagnosis not present

## 2022-10-27 DIAGNOSIS — I5032 Chronic diastolic (congestive) heart failure: Secondary | ICD-10-CM | POA: Diagnosis not present

## 2022-10-27 DIAGNOSIS — M1612 Unilateral primary osteoarthritis, left hip: Secondary | ICD-10-CM | POA: Diagnosis not present

## 2022-10-27 DIAGNOSIS — K5730 Diverticulosis of large intestine without perforation or abscess without bleeding: Secondary | ICD-10-CM | POA: Diagnosis not present

## 2022-10-31 ENCOUNTER — Telehealth: Payer: Self-pay

## 2022-10-31 ENCOUNTER — Encounter: Payer: Self-pay | Admitting: Cardiovascular Disease

## 2022-10-31 ENCOUNTER — Ambulatory Visit: Payer: Medicare Other | Attending: Cardiovascular Disease | Admitting: Cardiovascular Disease

## 2022-10-31 VITALS — BP 110/50 | HR 59 | Ht 67.0 in | Wt 184.6 lb

## 2022-10-31 DIAGNOSIS — M48061 Spinal stenosis, lumbar region without neurogenic claudication: Secondary | ICD-10-CM | POA: Diagnosis not present

## 2022-10-31 DIAGNOSIS — I495 Sick sinus syndrome: Secondary | ICD-10-CM | POA: Diagnosis not present

## 2022-10-31 DIAGNOSIS — K3184 Gastroparesis: Secondary | ICD-10-CM | POA: Diagnosis not present

## 2022-10-31 DIAGNOSIS — M1612 Unilateral primary osteoarthritis, left hip: Secondary | ICD-10-CM | POA: Diagnosis not present

## 2022-10-31 DIAGNOSIS — I2510 Atherosclerotic heart disease of native coronary artery without angina pectoris: Secondary | ICD-10-CM

## 2022-10-31 DIAGNOSIS — K2270 Barrett's esophagus without dysplasia: Secondary | ICD-10-CM | POA: Diagnosis not present

## 2022-10-31 DIAGNOSIS — E1151 Type 2 diabetes mellitus with diabetic peripheral angiopathy without gangrene: Secondary | ICD-10-CM | POA: Diagnosis not present

## 2022-10-31 DIAGNOSIS — I5032 Chronic diastolic (congestive) heart failure: Secondary | ICD-10-CM | POA: Diagnosis not present

## 2022-10-31 DIAGNOSIS — D631 Anemia in chronic kidney disease: Secondary | ICD-10-CM | POA: Diagnosis not present

## 2022-10-31 DIAGNOSIS — M5136 Other intervertebral disc degeneration, lumbar region: Secondary | ICD-10-CM | POA: Diagnosis not present

## 2022-10-31 DIAGNOSIS — E785 Hyperlipidemia, unspecified: Secondary | ICD-10-CM | POA: Diagnosis not present

## 2022-10-31 DIAGNOSIS — I10 Essential (primary) hypertension: Secondary | ICD-10-CM | POA: Diagnosis not present

## 2022-10-31 DIAGNOSIS — E039 Hypothyroidism, unspecified: Secondary | ICD-10-CM | POA: Diagnosis not present

## 2022-10-31 DIAGNOSIS — F32A Depression, unspecified: Secondary | ICD-10-CM | POA: Diagnosis not present

## 2022-10-31 DIAGNOSIS — M8580 Other specified disorders of bone density and structure, unspecified site: Secondary | ICD-10-CM | POA: Diagnosis not present

## 2022-10-31 DIAGNOSIS — K219 Gastro-esophageal reflux disease without esophagitis: Secondary | ICD-10-CM | POA: Diagnosis not present

## 2022-10-31 DIAGNOSIS — I130 Hypertensive heart and chronic kidney disease with heart failure and stage 1 through stage 4 chronic kidney disease, or unspecified chronic kidney disease: Secondary | ICD-10-CM | POA: Diagnosis not present

## 2022-10-31 DIAGNOSIS — M25562 Pain in left knee: Secondary | ICD-10-CM | POA: Diagnosis not present

## 2022-10-31 DIAGNOSIS — M1030 Gout due to renal impairment, unspecified site: Secondary | ICD-10-CM | POA: Diagnosis not present

## 2022-10-31 DIAGNOSIS — E1143 Type 2 diabetes mellitus with diabetic autonomic (poly)neuropathy: Secondary | ICD-10-CM | POA: Diagnosis not present

## 2022-10-31 DIAGNOSIS — K5730 Diverticulosis of large intestine without perforation or abscess without bleeding: Secondary | ICD-10-CM | POA: Diagnosis not present

## 2022-10-31 DIAGNOSIS — E11620 Type 2 diabetes mellitus with diabetic dermatitis: Secondary | ICD-10-CM | POA: Diagnosis not present

## 2022-10-31 DIAGNOSIS — N183 Chronic kidney disease, stage 3 unspecified: Secondary | ICD-10-CM | POA: Diagnosis not present

## 2022-10-31 DIAGNOSIS — E1122 Type 2 diabetes mellitus with diabetic chronic kidney disease: Secondary | ICD-10-CM | POA: Diagnosis not present

## 2022-10-31 NOTE — Patient Instructions (Signed)
Medication Instructions:  Your physician recommends that you continue on your current medications as directed. Please refer to the Current Medication list given to you today.  *If you need a refill on your cardiac medications before your next appointment, please call your pharmacy*   Lab Work: NONE If you have labs (blood work) drawn today and your tests are completely normal, you will receive your results only by: MyChart Message (if you have MyChart) OR A paper copy in the mail If you have any lab test that is abnormal or we need to change your treatment, we will call you to review the results.   Testing/Procedures: NONE   Follow-Up: At [facility], you and your health needs are our priority.  As part of our continuing mission to provide you with exceptional heart care, we have created designated Provider Care Teams.  These Care Teams include your primary Cardiologist (physician) and Advanced Practice Providers (APPs -  Physician Assistants and Nurse Practitioners) who all work together to provide you with the care you need, when you need it.  We recommend signing up for the patient portal called "MyChart".  Sign up information is provided on this After Visit Summary.  MyChart is used to connect with patients for Virtual Visits (Telemedicine).  Patients are able to view lab/test results, encounter notes, upcoming appointments, etc.  Non-urgent messages can be sent to your provider as well.   To learn more about what you can do with MyChart, go to [email or web address].    Your next appointment:   6 month(s)  Provider:   Robin Searing, NP, Eligha Bridegroom, NP, Tereso Newcomer, PA-C, or Perlie Gold, PA-C     Then, Tonny Bollman, MD will plan to see you again in 1 year(s).

## 2022-10-31 NOTE — Telephone Encounter (Signed)
[facility] has been having trouble getting this patient scheduled to do a Dat Scan. I just wanted to document that they have been calling on three different occasions and not able to get her scheduled she has said she would call back and they have reached out to the grandchild for help and still not able to get her scheduled

## 2022-10-31 NOTE — Telephone Encounter (Signed)
GM, The patient [name]) we spoke about Friday, I did call on Saturday with no answer. I was able to leave a VM on her phone. Please advise as I have been in contact with the grandchild listed and he has been no help. Onalee Hua

## 2022-10-31 NOTE — Telephone Encounter (Signed)
-----   Message from Nurse Dema Severin sent at 10/31/2022 10:34 AM EDT ----- Regarding: Remote PPM check Pt was hospitalized during her last pacemaker check per [facility]. Can we reach out to her and reschedule the remote check? Maralyn Sago

## 2022-10-31 NOTE — Progress Notes (Signed)
Cardiology Office Note:    Date:  10/31/2022   ID:  [name], DOB April 23, 1932, MRN [id number]  PCP:  [name], Grayling Congress, DO   [facility] Providers Cardiologist:  Tonny Bollman, MD Cardiology APP:  Beatrice Lecher, PA-C  Electrophysiologist:  Hillis Range, MD (Inactive)     Referring MD: [name], Grayling Congress, *   Chief Complaint  Patient presents with   Coronary Artery Disease    History of Present Illness:    [name] is a 89 y.o. female with a hx of:  Coronary artery disease  S/p CABG in 2011 LHC 12/02/2013: SVG-OM1/OM2, SVG-D1, SVG-PDA, LIMA-LAD patent, EF 60 Myoview 11/24/2015: EF 60, no ischemia, no infarct; low risk SSS s/p PPM (HFpEF) heart failure with preserved ejection fraction  TTE 07/13/2015: EF 55-60, normal wall motion, trivial AI, MAC, moderate LAE, PASP 35 TTE 01/31/2022: EF 55-60, no RWMA, GR 1 DD, mild reduced RVSF, normal PASP (RVSP 26.2), trivial MR, trivial AI, AV sclerosis without stenosis, RAP 3 Intol of SGLT2i (yeast infx); Spiro (diarrhea)   Hypertension Hyperlipidemia  Diabetes mellitus  Chronic kidney disease  Peripheral arterial disease  GERD/Barrett's esophagus Carotid artery disease [place] 10/19: Bilateral ICA 1-39 Hypothyroidism  The patient is here with her daughter today. She's been doing ok from a cardiovascular perspective. She's been in a rehab facility after a hospitalization, and is now back home. Today, she denies symptoms of palpitations, chest pain, shortness of breath, orthopnea, PND, lower extremity edema, dizziness, or syncope.  Past Medical History:  Diagnosis Date   Allergic rhinitis    Anemia    Anxiety    Barrett esophagus    CAD (coronary artery disease) 2009   a. Multivessel s/p PCI w/DES 2009 // b. s/p CABG 2011  //  c. LHC 8/15: pLAD 95 ISR, LCx 100, pOM1 40, dRCA 100, S-OM1/OM2 ok, S-D1 ok, S-PDA ok, L-LAD ok, EF 60%   Carotid artery disease (HCC)    a. Carotid US 9/15: RICA 1-39%; LICA  40-59% >> FU 1 year  //  b. Carotid US 9/17: R 1-39%, L 40-59% >> FU 1 year   Chronic diastolic heart failure (HCC)    CKD (chronic kidney disease), stage II    GFR 60-89 ml/min   Depression    Disc disease, degenerative, cervical    Diverticulosis    Gastroparesis    GERD (gastroesophageal reflux disease)    Gout    H/O hiatal hernia    Helicobacter pylori gastritis    History of echocardiogram    a. Echo 11/13: EF 55% to 60%. Grade 2 diastolic dysfunction, MAC, trivial MR, mild LAE, normal RVSF, mild RAE, PASP 39 mmHg  //  b. 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   Type II or unspecified type diabetes mellitus without mention of complication, not stated as uncontrolled     Past Surgical History:  Procedure Laterality Date   ABDOMINAL HYSTERECTOMY  CARDIAC CATHETERIZATION     2011  DR Claritza July (APPT NEXT WEEK)   CHOLECYSTECTOMY     CORONARY ARTERY BYPASS GRAFT  2011   LIMA-LAD, SVG-DIAG, SVG-OM1-OM2, SVG-PDA   CORONARY STENT PLACEMENT     Drug-eluting stent to the left anterior descending, circumflex and right coronary artery in Jan 2009   EYE SURGERY     BIL CATARACT REMOVAL 06/2010   LEFT HEART CATHETERIZATION WITH CORONARY ANGIOGRAM N/A 12/02/2013   Procedure: LEFT HEART CATHETERIZATION WITH CORONARY ANGIOGRAM;  Surgeon: Lesleigh Noe, MD;  Location: [facility] CATH LAB;  Service: Cardiovascular;  Laterality: N/A;   OVARIAN CYST REMOVAL     PACEMAKER INSERTION  03/06/12   MDT Adapta L implanted by Dr Johney Frame for SSS   PERMANENT PACEMAKER  INSERTION N/A 03/06/2012   Procedure: PERMANENT PACEMAKER INSERTION;  Surgeon: Hillis Range, MD;  Location: [facility] CATH LAB;  Service: Cardiovascular;  Laterality: N/A;   SHOULDER ARTHROSCOPY  06/16/2011   Procedure: ARTHROSCOPY SHOULDER;  Surgeon: Kennieth Rad, MD;  Location: [facility] OR;  Service: Orthopedics;  Laterality: Left;  LEFT SHOULDER ARTHROSCOPY ACROMIALPLASTY, POSSIBLE MINI OPEN CUFF REPAIR    TUBAL LIGATION      Current Medications: Current Meds  Medication Sig   Accu-Chek Softclix Lancets lancets SMARTSIG:Topical   acetaminophen (TYLENOL) 500 MG tablet Take 500 mg by mouth every 6 (six) hours as needed for headache (pain).   allopurinol (ZYLOPRIM) 100 MG tablet Take 100 mg by mouth at bedtime.    amLODipine (NORVASC) 5 MG tablet Take 5 mg by mouth 2 (two) times daily.   atorvastatin (LIPITOR) 20 MG tablet TAKE 1 TABLET BY MOUTH ONCE DAILY (Patient taking differently: Take 20 mg by mouth daily.)   azelastine (OPTIVAR) 0.05 % ophthalmic solution Place 1 drop into both eyes 2 (two) times daily.   Blood Glucose Monitoring Suppl DEVI 1 each by Does not apply route in the morning, at noon, and at bedtime. May substitute to any manufacturer covered by patient's insurance.   carvedilol (COREG) 12.5 MG tablet TAKE 1 TABLET BY MOUTH TWICE  DAILY   clopidogrel (PLAVIX) 75 MG tablet TAKE 1 TABLET BY MOUTH  DAILY (Patient taking differently: Take 75 mg by mouth daily. TAKE 1 TABLET BY MOUTH  DAILY)   furosemide (LASIX) 40 MG tablet Take 1 tablet (40 mg total) by mouth daily.   Glucose Blood (BLOOD GLUCOSE TEST STRIPS) STRP 1 each by In Vitro route in the morning, at noon, and at bedtime. May substitute to any manufacturer covered by patient's insurance.   Insulin Pen Needle (BD PEN NEEDLE MICRO U/F) 32G X 6 MM MISC To inject Humalin   Lancet Device MISC 1 each by Does not apply route in the morning, at noon, and at bedtime. May substitute to any manufacturer covered by patient's insurance.   Lancets  Misc. MISC 1 each by Does not apply route in the morning, at noon, and at bedtime. May substitute to any manufacturer covered by patient's insurance.   Multiple Vitamin (MULTIVITAMIN WITH MINERALS) TABS tablet Take 1 tablet by mouth at bedtime.    nitroGLYCERIN (NITROSTAT) 0.4 MG SL tablet DISSOLVE ONE TABLET UNDER THE TONGUE EVERY 5 MINUTES AS NEEDED FOR CHEST PAIN.  DO NOT EXCEED A TOTAL OF 3 DOSES IN 15 MINUTES   ONE TOUCH ULTRA TEST test strip 1 each by Other route daily as needed (blood sugar).    pantoprazole (PROTONIX) 40 MG tablet Take 1 tablet (40 mg total) by mouth daily.  potassium chloride (KLOR-CON) 10 MEQ tablet Take 1 tablet (10 mEq total) by mouth daily. (Patient taking differently: Take 10 mEq by mouth every Monday, Wednesday, and Friday.)   sacubitril-valsartan (ENTRESTO) 97-103 MG TAKE 1 TABLET BY MOUTH TWICE  DAILY   tobramycin (TOBREX) 0.3 % ophthalmic solution Place 1 drop into the left eye 4 (four) times daily.   Current Facility-Administered Medications for the 10/31/22 encounter (Office Visit) with Tonny Bollman, MD  Medication   betamethasone acetate-betamethasone sodium phosphate (CELESTONE) injection 3 mg     Allergies:   Ciprofloxacin, Codeine, Hydrocodone, Penicillins, Shellfish allergy, Diltiazem hcl, Sulfonamide derivatives, Morphine and codeine, Lovastatin, and Metformin   Social History   Socioeconomic History   Marital status: Widowed    Spouse name: Not on file   Number of children: Not on file   Years of education: Not on file   Highest education level: Not on file  Occupational History   Occupation: RETIRED LPN    Comment: worked as [employer]  Tobacco Use   Smoking status: Former   Smokeless tobacco: Never   Tobacco comments:    quit 30 yrs ago  Vaping Use   Vaping status: Never Used  Substance and Sexual Activity   Alcohol use: No   Drug use: No   Sexual activity: Not on file  Other Topics Concern   Not on file  Social History Narrative    Widowed 2004.., Lives with daughter and grand son ,Family history is negative for premature coronary artery disease. Mother died at age 54 with heart disease in her later years, father died at age 99 from a stroke.Marland KitchenShe  has 8 siblings, none of whom have coronary artery disease.   Social Determinants of Health   Financial Resource Strain: Low Risk  (11/01/2018)   Overall Financial Resource Strain (CARDIA)    Difficulty of Paying Living Expenses: Not hard at all  Food Insecurity: No Food Insecurity (09/01/2022)   Hunger Vital Sign    Worried About Running Out of Food in the Last Year: Never true    Ran Out of Food in the Last Year: Never true  Transportation Needs: No Transportation Needs (09/01/2022)   PRAPARE - Administrator, Civil Service (Medical): No    Lack of Transportation (Non-Medical): No  Physical Activity: Insufficiently Active (02/01/2021)   Exercise Vital Sign    Days of Exercise per Week: 5 days    Minutes of Exercise per Session: 20 min  Stress: No Stress Concern Present (05/04/2021)   [employer] of Occupational Health - Occupational Stress Questionnaire    Feeling of Stress : Only a little  Social Connections: Not on file     Family History: The patient's family history includes Cancer in her brother; Coronary artery disease in an other family member; Deafness in her daughter; Diabetes in her mother; Heart attack in her mother; Hypertension in her mother; Other in her son; Stroke in her father.  ROS:   Please see the history of present illness.    All other systems reviewed and are negative.  EKGs/Labs/Other Studies Reviewed:         Recent Labs: 03/31/2022: TSH 4.78 10/14/2022: ALT 10; BUN 40; Creatinine, Ser 1.68; Hemoglobin 11.0; Platelets 201.0; Potassium 4.1; Sodium 140  Recent Lipid Panel    Component Value Date/Time   CHOL 121 10/14/2022 1143   TRIG 152.0 (H) 10/14/2022 1143   TRIG 192 (H) 04/20/2006 1246   HDL 58.80 10/14/2022 1143    CHOLHDL 2  10/14/2022 1143   VLDL 30.4 10/14/2022 1143   LDLCALC 32 10/14/2022 1143   LDLDIRECT 82.5 09/11/2009 0000     Risk Assessment/Calculations:                Physical Exam:    VS:  BP (!) 110/50   Pulse (!) 59   Ht 5\' 7"  (1.702 m)   Wt 184 lb 9.6 oz (83.7 kg)   SpO2 99%   BMI 28.91 kg/m     Wt Readings from Last 3 Encounters:  10/31/22 184 lb 9.6 oz (83.7 kg)  10/14/22 187 lb 3.2 oz (84.9 kg)  10/11/22 186 lb (84.4 kg)     GEN:  Well nourished, well developed elderly woman in no acute distress HEENT: Normal NECK: No JVD; No carotid bruits LYMPHATICS: No lymphadenopathy CARDIAC: RRR, SEM at the RUSB, 2/6 RESPIRATORY:  Clear to auscultation without rales, wheezing or rhonchi  ABDOMEN: Soft, non-tender, non-distended MUSCULOSKELETAL:  1+ bilateral pretibial edema; No deformity  SKIN: Warm and dry NEUROLOGIC:  Alert and oriented x 3 PSYCHIATRIC:  Normal affect   ASSESSMENT:    1. Sick sinus syndrome (HCC)   2. Coronary artery disease involving native coronary artery of native heart without angina pectoris   3. Chronic heart failure with preserved ejection fraction (HCC)   4. Hyperlipidemia LDL goal <70   5. Essential hypertension    PLAN:    In order of problems listed above:  Status post permanent pacemaker.  She has had normal device function and I reviewed her most recent transmission from March of this year. No anginal symptoms.  Continues on clopidogrel and atorvastatin. Appears clinically stable.  I suspect her lower extremity edema is a combination of stasis and amlodipine more than true volume overload.  She tolerates this well and it is a chronic, unchanged problem.  Continue Entresto at current dose.  Continue furosemide 40 mg daily. Treated with atorvastatin.  Last LDL cholesterol 32 checked recently. Blood pressure is well-controlled on a combination of amlodipine, carvedilol, Entresto, and furosemide.     Medication Adjustments/Labs and  Tests Ordered: Current medicines are reviewed at length with the patient today.  Concerns regarding medicines are outlined above.  No orders of the defined types were placed in this encounter.  No orders of the defined types were placed in this encounter.   Patient Instructions  Medication Instructions:  Your physician recommends that you continue on your current medications as directed. Please refer to the Current Medication list given to you today.  *If you need a refill on your cardiac medications before your next appointment, please call your pharmacy*   Lab Work: NONE If you have labs (blood work) drawn today and your tests are completely normal, you will receive your results only by: MyChart Message (if you have MyChart) OR A paper copy in the mail If you have any lab test that is abnormal or we need to change your treatment, we will call you to review the results.   Testing/Procedures: NONE   Follow-Up: At [facility], you and your health needs are our priority.  As part of our continuing mission to provide you with exceptional heart care, we have created designated Provider Care Teams.  These Care Teams include your primary Cardiologist (physician) and Advanced Practice Providers (APPs -  Physician Assistants and Nurse Practitioners) who all work together to provide you with the care you need, when you need it.  We recommend signing up for the patient portal  called "MyChart".  Sign up information is provided on this After Visit Summary.  MyChart is used to connect with patients for Virtual Visits (Telemedicine).  Patients are able to view lab/test results, encounter notes, upcoming appointments, etc.  Non-urgent messages can be sent to your provider as well.   To learn more about what you can do with MyChart, go to [email or web address].    Your next appointment:   6 month(s)  Provider:   Robin Searing, NP, Eligha Bridegroom, NP, Tereso Newcomer, PA-C, or Perlie Gold, PA-C     Then, Tonny Bollman, MD will plan to see you again in 1 year(s).       Signed, Tonny Bollman, MD  10/31/2022 1:10 PM    [facility]

## 2022-10-31 NOTE — Telephone Encounter (Signed)
The patient agreed to send missed transmission tomorrow.

## 2022-11-01 ENCOUNTER — Ambulatory Visit (INDEPENDENT_AMBULATORY_CARE_PROVIDER_SITE_OTHER): Payer: Medicare Other

## 2022-11-01 DIAGNOSIS — I495 Sick sinus syndrome: Secondary | ICD-10-CM | POA: Diagnosis not present

## 2022-11-02 ENCOUNTER — Ambulatory Visit (INDEPENDENT_AMBULATORY_CARE_PROVIDER_SITE_OTHER): Payer: Medicare Other | Admitting: Emergency Medicine

## 2022-11-02 VITALS — Ht 67.0 in | Wt 184.0 lb

## 2022-11-02 DIAGNOSIS — N183 Chronic kidney disease, stage 3 unspecified: Secondary | ICD-10-CM | POA: Diagnosis not present

## 2022-11-02 DIAGNOSIS — E1151 Type 2 diabetes mellitus with diabetic peripheral angiopathy without gangrene: Secondary | ICD-10-CM | POA: Diagnosis not present

## 2022-11-02 DIAGNOSIS — K3184 Gastroparesis: Secondary | ICD-10-CM | POA: Diagnosis not present

## 2022-11-02 DIAGNOSIS — M25562 Pain in left knee: Secondary | ICD-10-CM | POA: Diagnosis not present

## 2022-11-02 DIAGNOSIS — K5730 Diverticulosis of large intestine without perforation or abscess without bleeding: Secondary | ICD-10-CM | POA: Diagnosis not present

## 2022-11-02 DIAGNOSIS — M8580 Other specified disorders of bone density and structure, unspecified site: Secondary | ICD-10-CM | POA: Diagnosis not present

## 2022-11-02 DIAGNOSIS — Z0000 Encounter for general adult medical examination without abnormal findings: Secondary | ICD-10-CM

## 2022-11-02 DIAGNOSIS — F32A Depression, unspecified: Secondary | ICD-10-CM | POA: Diagnosis not present

## 2022-11-02 DIAGNOSIS — M1612 Unilateral primary osteoarthritis, left hip: Secondary | ICD-10-CM | POA: Diagnosis not present

## 2022-11-02 DIAGNOSIS — E1122 Type 2 diabetes mellitus with diabetic chronic kidney disease: Secondary | ICD-10-CM | POA: Diagnosis not present

## 2022-11-02 DIAGNOSIS — I5032 Chronic diastolic (congestive) heart failure: Secondary | ICD-10-CM | POA: Diagnosis not present

## 2022-11-02 DIAGNOSIS — E039 Hypothyroidism, unspecified: Secondary | ICD-10-CM | POA: Diagnosis not present

## 2022-11-02 DIAGNOSIS — I2510 Atherosclerotic heart disease of native coronary artery without angina pectoris: Secondary | ICD-10-CM | POA: Diagnosis not present

## 2022-11-02 DIAGNOSIS — E11620 Type 2 diabetes mellitus with diabetic dermatitis: Secondary | ICD-10-CM | POA: Diagnosis not present

## 2022-11-02 DIAGNOSIS — M1030 Gout due to renal impairment, unspecified site: Secondary | ICD-10-CM | POA: Diagnosis not present

## 2022-11-02 DIAGNOSIS — K219 Gastro-esophageal reflux disease without esophagitis: Secondary | ICD-10-CM | POA: Diagnosis not present

## 2022-11-02 DIAGNOSIS — I130 Hypertensive heart and chronic kidney disease with heart failure and stage 1 through stage 4 chronic kidney disease, or unspecified chronic kidney disease: Secondary | ICD-10-CM | POA: Diagnosis not present

## 2022-11-02 DIAGNOSIS — M48061 Spinal stenosis, lumbar region without neurogenic claudication: Secondary | ICD-10-CM | POA: Diagnosis not present

## 2022-11-02 DIAGNOSIS — E1143 Type 2 diabetes mellitus with diabetic autonomic (poly)neuropathy: Secondary | ICD-10-CM | POA: Diagnosis not present

## 2022-11-02 DIAGNOSIS — E785 Hyperlipidemia, unspecified: Secondary | ICD-10-CM | POA: Diagnosis not present

## 2022-11-02 DIAGNOSIS — D631 Anemia in chronic kidney disease: Secondary | ICD-10-CM | POA: Diagnosis not present

## 2022-11-02 DIAGNOSIS — M5136 Other intervertebral disc degeneration, lumbar region: Secondary | ICD-10-CM | POA: Diagnosis not present

## 2022-11-02 DIAGNOSIS — I495 Sick sinus syndrome: Secondary | ICD-10-CM | POA: Diagnosis not present

## 2022-11-02 DIAGNOSIS — K2270 Barrett's esophagus without dysplasia: Secondary | ICD-10-CM | POA: Diagnosis not present

## 2022-11-02 NOTE — Progress Notes (Signed)
Subjective:  Per patient no change in vitals since last visit; unable to obtain new vitals due to this being a telehealth visit.  Patient was unable to self-report vital signs via telehealth due to a lack of equipment at home.    [name] is a 89 y.o. female who presents for Medicare Annual (Subsequent) preventive examination.  Visit Complete: Virtual  I connected with  [name] on 11/02/22 by a audio enabled telemedicine application and verified that I am speaking with the correct person using two identifiers.  Patient Location: Home  Provider Location: Home Office  I discussed the limitations of evaluation and management by telemedicine. The patient expressed understanding and agreed to proceed.   Review of Systems     Cardiac Risk Factors include: advanced age (>90men, >28 women);diabetes mellitus;dyslipidemia;hypertension;Other (see comment), Risk factor comments: Known CAD     Objective:    Today's Vitals   11/02/22 1030  Weight: 184 lb (83.5 kg)  Height: 5\' 7"  (1.702 m)   Body mass index is 28.82 kg/m.     11/02/2022   10:49 AM 10/11/2022    9:40 AM 09/01/2022   10:59 AM 08/31/2022   10:59 PM 08/15/2022    5:02 PM 07/07/2021    3:12 AM 07/06/2021   11:49 AM  Advanced Directives  Does Patient Have a Medical Advance Directive? Yes Yes No No Yes No No  Type of Estate agent of [place];Living will Living will   Healthcare Power of Attorney    Does patient want to make changes to medical advance directive? No - Patient declined        Copy of Healthcare Power of Attorney in Chart? No - copy requested        Would patient like information on creating a medical advance directive?   No - Patient declined No - Patient declined       Current Medications (verified) Outpatient Encounter Medications as of 11/02/2022  Medication Sig   Accu-Chek Softclix Lancets lancets SMARTSIG:Topical   acetaminophen (TYLENOL) 500 MG tablet Take 500 mg by  mouth every 6 (six) hours as needed for headache (pain).   allopurinol (ZYLOPRIM) 100 MG tablet Take 100 mg by mouth at bedtime.    amLODipine (NORVASC) 5 MG tablet Take 5 mg by mouth 2 (two) times daily.   atorvastatin (LIPITOR) 20 MG tablet TAKE 1 TABLET BY MOUTH ONCE DAILY (Patient taking differently: Take 20 mg by mouth daily.)   azelastine (OPTIVAR) 0.05 % ophthalmic solution Place 1 drop into both eyes 2 (two) times daily.   Blood Glucose Monitoring Suppl DEVI 1 each by Does not apply route in the morning, at noon, and at bedtime. May substitute to any manufacturer covered by patient's insurance.   carvedilol (COREG) 12.5 MG tablet TAKE 1 TABLET BY MOUTH TWICE  DAILY   clopidogrel (PLAVIX) 75 MG tablet TAKE 1 TABLET BY MOUTH  DAILY (Patient taking differently: Take 75 mg by mouth daily. TAKE 1 TABLET BY MOUTH  DAILY)   furosemide (LASIX) 40 MG tablet Take 1 tablet (40 mg total) by mouth daily.   Glucose Blood (BLOOD GLUCOSE TEST STRIPS) STRP 1 each by In Vitro route in the morning, at noon, and at bedtime. May substitute to any manufacturer covered by patient's insurance.   Insulin Pen Needle (BD PEN NEEDLE MICRO U/F) 32G X 6 MM MISC To inject Humalin   Lancet Device MISC 1 each by Does not apply route in the morning, at  noon, and at bedtime. May substitute to any manufacturer covered by patient's insurance.   Lancets Misc. MISC 1 each by Does not apply route in the morning, at noon, and at bedtime. May substitute to any manufacturer covered by patient's insurance.   Multiple Vitamin (MULTIVITAMIN WITH MINERALS) TABS tablet Take 1 tablet by mouth at bedtime.    nitroGLYCERIN (NITROSTAT) 0.4 MG SL tablet DISSOLVE ONE TABLET UNDER THE TONGUE EVERY 5 MINUTES AS NEEDED FOR CHEST PAIN.  DO NOT EXCEED A TOTAL OF 3 DOSES IN 15 MINUTES   ONE TOUCH ULTRA TEST test strip 1 each by Other route daily as needed (blood sugar).    pantoprazole (PROTONIX) 40 MG tablet Take 1 tablet (40 mg total) by mouth  daily.   potassium chloride (KLOR-CON) 10 MEQ tablet Take 1 tablet (10 mEq total) by mouth daily. (Patient taking differently: Take 10 mEq by mouth every Monday, Wednesday, and Friday.)   sacubitril-valsartan (ENTRESTO) 97-103 MG TAKE 1 TABLET BY MOUTH TWICE  DAILY   tobramycin (TOBREX) 0.3 % ophthalmic solution Place 1 drop into the left eye 4 (four) times daily.   Facility-Administered Encounter Medications as of 11/02/2022  Medication   betamethasone acetate-betamethasone sodium phosphate (CELESTONE) injection 3 mg    Allergies (verified) Ciprofloxacin, Codeine, Hydrocodone, Penicillins, Shellfish allergy, Diltiazem hcl, Sulfonamide derivatives, Morphine and codeine, Lovastatin, and Metformin   History: Past Medical History:  Diagnosis Date   Allergic rhinitis    Anemia    Anxiety    Barrett esophagus    CAD (coronary artery disease) 2009   a. Multivessel s/p PCI w/DES 2009 // b. s/p CABG 2011  //  c. LHC 8/15: pLAD 95 ISR, LCx 100, pOM1 40, dRCA 100, S-OM1/OM2 ok, S-D1 ok, S-PDA ok, L-LAD ok, EF 60%   Carotid artery disease (HCC)    a. Carotid US 9/15: RICA 1-39%; LICA 40-59% >> FU 1 year  //  b. Carotid US 9/17: R 1-39%, L 40-59% >> FU 1 year   Chronic diastolic heart failure (HCC)    CKD (chronic kidney disease), stage II    GFR 60-89 ml/min   Depression    Disc disease, degenerative, cervical    Diverticulosis    Gastroparesis    GERD (gastroesophageal reflux disease)    Gout    H/O hiatal hernia    Helicobacter pylori gastritis    History of echocardiogram    a. Echo 11/13: EF 55% to 60%. Grade 2 diastolic dysfunction, MAC, trivial MR, mild LAE, normal RVSF, mild RAE, PASP 39 mmHg  //  b. Echo 4/17: EF 55-60%, normal wall motion, trivial AI, MAC, moderate LAE, mild RVE, PASP 35 mmHg   History of thrombocytopenia    HTN (hypertension)    Hyperlipidemia    Hypothyroidism    LBP (low back pain)    Lumbar disc disease/lumbar spinal stenosis   Macular degeneration     Morbid obesity (HCC)    Myocardial infarction (HCC)    Osteoarthritis    Osteopenia    PVD (peripheral vascular disease) (HCC)    Sick sinus syndrome (HCC)    MDT Dual-chamber PPM implant 02/2012   Type II or unspecified type diabetes mellitus without mention of complication, not stated as uncontrolled    Past Surgical History:  Procedure Laterality Date   ABDOMINAL HYSTERECTOMY     CARDIAC CATHETERIZATION     2011  DR COOPER (APPT NEXT WEEK)   CHOLECYSTECTOMY     CORONARY ARTERY BYPASS GRAFT  2011  LIMA-LAD, SVG-DIAG, SVG-OM1-OM2, SVG-PDA   CORONARY STENT PLACEMENT     Drug-eluting stent to the left anterior descending, circumflex and right coronary artery in Jan 2009   EYE SURGERY     BIL CATARACT REMOVAL 06/2010   LEFT HEART CATHETERIZATION WITH CORONARY ANGIOGRAM N/A 12/02/2013   Procedure: LEFT HEART CATHETERIZATION WITH CORONARY ANGIOGRAM;  Surgeon: Lesleigh Noe, MD;  Location: [facility] CATH LAB;  Service: Cardiovascular;  Laterality: N/A;   OVARIAN CYST REMOVAL     PACEMAKER INSERTION  03/06/12   MDT Adapta L implanted by Dr Johney Frame for SSS   PERMANENT PACEMAKER INSERTION N/A 03/06/2012   Procedure: PERMANENT PACEMAKER INSERTION;  Surgeon: Hillis Range, MD;  Location[facility] CATH LAB;  Service: Cardiovascular;  Laterality: N/A;   SHOULDER ARTHROSCOPY  06/16/2011   Procedure: ARTHROSCOPY SHOULDER;  Surgeon: Kennieth Rad, MD;  Location[facility] OR;  Service: Orthopedics;  Laterality: Left;  LEFT SHOULDER ARTHROSCOPY ACROMIALPLASTY, POSSIBLE MINI OPEN CUFF REPAIR    TUBAL LIGATION     Family History  Problem Relation Age of Onset   Diabetes Mother    Hypertension Mother    Heart attack Mother    Stroke Father    Cancer Brother    Deafness Daughter    Other Son        cerebral edema at 18 months   Coronary artery disease Other    Social History   Socioeconomic History   Marital status: Widowed    Spouse name: Not on file   Number of children: Not on file   Years of  education: Not on file   Highest education level: Not on file  Occupational History   Occupation: RETIRED LPN    Comment: worked as [employer]  Tobacco Use   Smoking status: Former    Current packs/day: 0.00    Average packs/day: 0.3 packs/day for 28.0 years (7.0 ttl pk-yrs)    Types: Cigarettes    Start date: 58    Quit date: 87    Years since quitting: 30.5   Smokeless tobacco: Never   Tobacco comments:    quit 30 yrs ago  Vaping Use   Vaping status: Never Used  Substance and Sexual Activity   Alcohol use: No   Drug use: No   Sexual activity: Not on file  Other Topics Concern   Not on file  Social History Narrative   Widowed 2004.., Lives with daughter and grand son ,Family history is negative for premature coronary artery disease. Mother died at age 47 with heart disease in her later years, father died at age 37 from a stroke.Marland KitchenShe  has 8 siblings, none of whom have coronary artery disease.   Social Determinants of Health   Financial Resource Strain: Low Risk  (11/02/2022)   Overall Financial Resource Strain (CARDIA)    Difficulty of Paying Living Expenses: Not hard at all  Food Insecurity: No Food Insecurity (11/02/2022)   Hunger Vital Sign    Worried About Running Out of Food in the Last Year: Never true    Ran Out of Food in the Last Year: Never true  Transportation Needs: No Transportation Needs (11/02/2022)   PRAPARE - Administrator, Civil Service (Medical): No    Lack of Transportation (Non-Medical): No  Physical Activity: Insufficiently Active (11/02/2022)   Exercise Vital Sign    Days of Exercise per Week: 5 days    Minutes of Exercise per Session: 20 min  Stress: No Stress Concern Present (11/02/2022)  [employer] of Occupational Health - Occupational Stress Questionnaire    Feeling of Stress : Only a little  Social Connections: Moderately Isolated (11/02/2022)   Social Connection and Isolation Panel [NHANES]    Frequency of Communication with  Friends and Family: More than three times a week    Frequency of Social Gatherings with Friends and Family: Once a week    Attends Religious Services: More than 4 times per year    Active Member of [employer] or Organizations: No    Attends Banker Meetings: Never    Marital Status: Widowed    Tobacco Counseling Counseling given: Not Answered Tobacco comments: quit 30 yrs ago   Clinical Intake:  Pre-visit preparation completed: Yes  Pain : No/denies pain     BMI - recorded: 28.82 Nutritional Status: BMI 25 -29 Overweight Nutritional Risks: None Diabetes: Yes CBG done?: No Did pt. bring in CBG monitor from home?: No  How often do you need to have someone help you when you read instructions, pamphlets, or other written materials from your doctor or pharmacy?: 1 - Never  Interpreter Needed?: No  Information entered by :: Tora Kindred, CMA   Activities of Daily Living    11/02/2022   10:35 AM 09/02/2022   10:42 AM  In your present state of health, do you have any difficulty performing the following activities:  Hearing? 1   Comment wears hearing aids   Vision? 0   Difficulty concentrating or making decisions? 0   Walking or climbing stairs? 1   Comment rollator   Dressing or bathing? 1   Comment has an aide to help with bath   Doing errands, shopping? 1 1  Comment grandson or daughter drives her. She doesn't drive anymore   Preparing Food and eating ? Y   Comment grandson helps prepare food, can eat independently   Using the Toilet? N   In the past six months, have you accidently leaked urine? N   Do you have problems with loss of bowel control? N   Managing your Medications? N   Managing your Finances? N   Housekeeping or managing your Housekeeping? Y   Comment grandson helps     Patient Care Team: [name], Grayling Congress, DO as PCP - General (Family Medicine) Tonny Bollman, MD as PCP - Cardiology (Cardiology) Hillis Range, MD (Inactive) as PCP -  Electrophysiology (Cardiology) Kennon Rounds as Physician Assistant (Cardiology) Soundra Pilon, LCSW as Social Worker (Licensed Clinical Social Worker) Stephannie Li, MD as Consulting Physician (Ophthalmology) Colletta Maryland, RN as [facility] Care Management  Indicate any recent Medical Services you may have received from other than [facility] providers in the past year (date may be approximate).     Assessment:   This is a routine wellness examination for [place].  Hearing/Vision screen Hearing Screening - Comments:: Wears hearing aids  Dietary issues and exercise activities discussed:     Goals Addressed               This Visit's Progress     Get Home Health Services   On track     Activities and task to complete in order to accomplish goals.   I have contacted the In-Home Aide program [phone] at Department of Social Service you are now on the wait list I have spoken to social worker [name] I am glad Home Health Services with [facility] has started ( OT, PT and RN) per your request  I have contacted Dr. [name] to updated the order and add an aide for you. You and I spoke to [facility] [phone] today to get you set up for post discharge meals.  They verified that you qualify for 28 meals. The first 11 will be delivered by or before July 3rd and the second set of 14 will be delivered one week later.        Patient Stated (pt-stated)        Get stronger to be able to do things by herself      Depression Screen    11/02/2022   10:46 AM 07/05/2022   10:35 AM 08/12/2021    2:14 PM 05/04/2021   11:23 AM 02/01/2021   12:17 PM 02/24/2020   11:40 AM 11/01/2018   11:48 AM  PHQ 2/9 Scores  PHQ - 2 Score 0 1 1 0 0 0 2  PHQ- 9 Score 0      2    Fall Risk    11/02/2022   10:50 AM 10/11/2022    9:39 AM 05/27/2022   10:37 AM 08/12/2021    1:44 PM 05/04/2021   11:08 AM  Fall Risk   Falls in the past year? 0 0 0 1 1  Number falls in past  yr: 0 0 0 0 0  Injury with Fall? 0 0 0 0 0  Risk for fall due to : Impaired mobility  Impaired balance/gait;Impaired mobility History of fall(s);Impaired balance/gait;Impaired mobility Impaired mobility;Impaired balance/gait;History of fall(s)  Follow up Falls prevention discussed Falls evaluation completed Falls evaluation completed Falls prevention discussed;Education provided;Falls evaluation completed Falls prevention discussed;Education provided;Falls evaluation completed    MEDICARE RISK AT HOME:  Medicare Risk at Home - 11/02/22 1050     Any stairs in or around the home? Yes    If so, are there any without handrails? No    Home free of loose throw rugs in walkways, pet beds, electrical cords, etc? Yes    Adequate lighting in your home to reduce risk of falls? Yes    Life alert? Yes    Use of a cane, walker or w/c? Yes    Grab bars in the bathroom? No    Shower chair or bench in shower? Yes    Elevated toilet seat or a handicapped toilet? Yes             TIMED UP AND GO:  Was the test performed?  No    Cognitive Function:        11/02/2022   10:51 AM  6CIT Screen  What Year? 4 points  What month? 0 points  What time? 0 points  Count back from 20 0 points  Months in reverse 4 points  Repeat phrase 0 points  Total Score 8 points    Immunizations Immunization History  Administered Date(s) Administered   COVID-19, mRNA, vaccine(Comirnaty)12 years and older 02/22/2022   Fluad Quad(high Dose 65+) 02/22/2022   Influenza Split 01/19/2013   Influenza Whole 01/24/2006, 02/16/2007, 01/14/2008, 03/20/2009, 01/21/2010   Influenza, High Dose Seasonal PF 01/08/2019   Influenza-Unspecified 03/12/2011   PFIZER(Purple Top)SARS-COV-2 Vaccination 06/02/2019, 06/25/2019, 01/27/2020   PNEUMOCOCCAL CONJUGATE-20 08/22/2022   Pfizer Covid-19 Vaccine Bivalent Booster 70yrs & up 03/16/2021   Pneumococcal Polysaccharide-23 01/13/2003, 09/09/2008   Td 09/09/2008    TDAP status:  Due, Education has been provided regarding the importance of this vaccine. Advised may receive this vaccine at local pharmacy or Health Dept. Aware to provide a copy  of the vaccination record if obtained from local pharmacy or Health Dept. Verbalized acceptance and understanding.  Flu Vaccine status: Up to date  Pneumococcal vaccine status: Up to date  Covid-19 vaccine status: Information provided on how to obtain vaccines.   Qualifies for Shingles Vaccine? Yes   Zostavax completed No   Shingrix Completed?: Yes Requested patient bring records from pharmacy to doctor's office to update her chart.  Screening Tests Health Maintenance  Topic Date Due   OPHTHALMOLOGY EXAM  Never done   Zoster Vaccines- Shingrix (1 of 2) Never done   DTaP/Tdap/Td (2 - Tdap) 09/10/2018   COVID-19 Vaccine (6 - 2023-24 season) 04/19/2022   INFLUENZA VACCINE  11/10/2022   FOOT EXAM  02/16/2023   HEMOGLOBIN A1C  04/16/2023   Medicare Annual Wellness (AWV)  11/02/2023   Pneumonia Vaccine 30+ Years old  Completed   DEXA SCAN  Completed   HPV VACCINES  Aged Out    Health Maintenance  Health Maintenance Due  Topic Date Due   OPHTHALMOLOGY EXAM  Never done   Zoster Vaccines- Shingrix (1 of 2) Never done   DTaP/Tdap/Td (2 - Tdap) 09/10/2018   COVID-19 Vaccine (6 - 2023-24 season) 04/19/2022    Colorectal cancer screening: No longer required.   Mammogram status: No longer required due to age.  Bone Density status: Completed 06/07/2004. Results reflect: Bone density results: OSTEOPENIA. Repeat every 2 years.  Lung Cancer Screening: (Low Dose CT Chest recommended if Age 3-80 years, 20 pack-year currently smoking OR have quit w/in 15years.) does not qualify.   Lung Cancer Screening Referral: n/a  Additional Screening:  Hepatitis C Screening: does not qualify; Completed never  Vision Screening: Recommended annual ophthalmology exams for early detection of glaucoma and other disorders of the eye. Is the  patient up to date with their annual eye exam?  Yes  Who is the provider or what is the name of the office in which the patient attends annual eye exams? Dr. Stephannie Li If pt is not established with a provider, would they like to be referred to a provider to establish care? No .   Dental Screening: Recommended annual dental exams for proper oral hygiene  Diabetic Foot Exam: Diabetic Foot Exam: Completed 02/15/22  Community Resource Referral / Chronic Care Management: CRR required this visit?  No   CCM required this visit?  No     Plan:     I have personally reviewed and noted the following in the patient's chart:   Medical and social history Use of alcohol, tobacco or illicit drugs  Current medications and supplements including opioid prescriptions. Patient is not currently taking opioid prescriptions. Functional ability and status Nutritional status Physical activity Advanced directives List of other physicians Hospitalizations, surgeries, and ER visits in previous 12 months Vitals Screenings to include cognitive, depression, and falls Referrals and appointments  In addition, I have reviewed and discussed with patient certain preventive protocols, quality metrics, and best practice recommendations. A written personalized care plan for preventive services as well as general preventive health recommendations were provided to patient.     Tora Kindred, CMA   11/02/2022   After Visit Summary: (Mail) Due to this being a telephonic visit, the after visit summary with patients personalized plan was offered to patient via mail   Nurse Notes:  6 CIT Score - 8 Patient needs Tdap. Advised may get at local pharmacy. Patient states she has received the shingles vaccine. Requested she bring records in from pharmacy  so that chart can be updated. Consider DEXA Scan - last done 2006 and showed osteopenia.

## 2022-11-02 NOTE — Patient Instructions (Signed)
[name] , Thank you for taking time to come for your Medicare Wellness Visit. I appreciate your ongoing commitment to your health goals. Please review the following plan we discussed and let me know if I can assist you in the future.   These are the goals we discussed:  Goals       [facility]) Patient will verbalize continuation of monitoring her blood sugar daily and recording the values for the next 90 days      Timeframe:  Long-Range Goal Priority:  High Start Date:  02/24/20                           Expected End Date: 05/10/21                  Follow Up Date 05/10/21   - check blood sugar at prescribed times - check blood sugar if I feel it is too high or too low - enter blood sugar readings and medication or insulin into daily log - take the blood sugar log to all doctor visits - take the blood sugar meter to all doctor visits  -Encouraged patient to continue to a diet low in sugar and carbohydrates -Encouraged patient to continue to walk routinely -Encouraged patient to continue to lose weight   Why is this important?   Checking your blood sugar at home helps to keep it from getting very high or very low.  Writing the results in a diary or log helps the doctor know how to care for you.  Your blood sugar log should have the time, date and the results.  Also, write down the amount of insulin or other medicine that you take.  Other information, like what you ate, exercise done and how you were feeling, will also be helpful.     Notes: 02/01/21: Patient states her diabetes is under control. Her blood sugar today was 122 and her last A1c was 6.6 on 01/01/21.  02/24/20: Patient reports checking her blood sugar 2-3 times daily and recording the values.  Updated 07/29/20: Patient continues to take her blood sugar 1-2 times daily. She has continued with her weight loss journey and reports she now weighs 199 pounds. Nurse congratulated the patient on her dedication to her health and  wellness.  Updated 10/30/20: Patient reports continuation of taking her blood sugar daily. Patient states that her diabetes is currently under control, her last A1c was 6.8 on 09/24/20, and her A1c goal is 7.      [facility]) Patient will verbalize continuation of weighing herself 2-3 weekly and monitoring for signs of heart failure daily within the next 90 days      Timeframe:  Long-Range Goal Priority:  Medium Start Date: 02/24/20                            Expected End Date: 1-30/23                    Follow Up Date 05/10/21   - call office if I gain more than 2 pounds in one day or 5 pounds in one week - keep legs up while sitting - track weight in diary - use salt in moderation - watch for swelling in feet, ankles and legs every day -Discussed weighing 2-3 times weekly and recording values in [facility] calendar booklet -Discussed using [facility] calendar resource sheet to monitor for symptoms  of heart failure daily -Encouraged patient to limit salt intake -Encouraged patient to continue to walk routinely and lose weight  Why is this important?   It is important to check your weight daily and watch how much salt and liquids you have.  It will help you to manage your heart failure.    Notes: 02/01/21: Patient reports that her weight is stable and stays between 183-186. Patient continues to eat a low sodium diet, walks routinely short distances, and is working with [facility] PT to increase her strength and mobility.  Updated 10/30/20: Patient states she continues to weigh herself 2-3 times weekly and records her values. She is working with PT 2 times a week and does continue to walk routinely. Patient's  reports her weight  has remained stable currently 197 and that she has maintained her 60 pound weight loss which is her goal. Patient states she continues to make healthier food choices and uses portion control.  Nurse congratulated the patient for maintaining her weight goal.  Updated 07/29/20: Patient states she  received [facility] calendar booklet, that she would weigh herself 2-3 times weekly, record the values, and use the color zones and rescue action plans to monitor for symptoms of heart failure. Congratulated patient for her dedication to maintaining a healthy lifestyle and losing approximately 60 pounds.        [facility]) Patient will verbalize monitoring their B/P 2 times weekly within the next 90 days      Timeframe:  Long-Range Goal Priority:  Medium Start Date:  10/30/20                           Expected End Date: 10/08/21                     Follow Up Date 05/10/21    - write blood pressure results in a log or diary -Encouraged patient to take her B/P 2 times weekly and record the values into her calendar booklet   -Encouraged patient to limit salt in her diet -Encouraged continuation of walking routinely  Why is this important?   You won't feel high blood pressure, but it can still hurt your blood vessels.  High blood pressure can cause heart or kidney problems. It can also cause a stroke.  Making lifestyle changes like losing a little weight or eating less salt will help.  Checking your blood pressure at home and at different times of the day can help to control blood pressure.  If the doctor prescribes medicine remember to take it the way the doctor ordered.  Call the office if you cannot afford the medicine or if there are questions about it.     Notes: 02/01/21: Patient reports taking her B/P daily and recording the values. Her B/P today was 146/65 and she states she is working closely with her PCP to lower her B/P. She continues to eat a low sodium diet, walk in her corridor routinely, and works with [facility] PT.  10/30/20: Patient states that she does not take her B/P routinely. Her last remembered B/P was 156/67. Nurse discussed that this value is high and encouraged patient to take her B/P at home. Patient states she will take her B/P and write the value into her calendar booklet every Tuesday  and Thursday. Nurse will send hypertension education.      Assist with health management post rehab stay      Interventions Today  Flowsheet Row Most Recent Value  Chronic Disease   Chronic disease during today's visit Diabetes, Hypertension (HTN), Chronic Kidney Disease/End Stage Renal Disease (ESRD)  General Interventions   General Interventions Discussed/Reviewed General Interventions Reviewed, Doctor Visits, Communication with  Doctor Visits Discussed/Reviewed Doctor Visits Discussed, PCP, Specialist  [facility] confirmed patient has transportation to cardiology appointment.]  PCP/Specialist Visits Compliance with follow-up visit  Communication with PCP/Specialists, Social Work  [employer] PCP-address education for glucose meter. update LCSW regarding SCAT passes]  Education Interventions   Education Provided Provided Education  Provided Verbal Education On Insurance Plans  [advsised to continue to take medications as prescribed,  attend provider visits as recommended,  discussed OTC benefits and encouraged to call insurance re: scales.]  Nutrition Interventions   Nutrition Discussed/Reviewed Nutrition Reviewed  [encouraged to eat healthy. confirmed patient has food in the home]  Pharmacy Interventions   Pharmacy Dicussed/Reviewed Pharmacy Topics Reviewed  Safety Interventions   Safety Discussed/Reviewed Fall Risk, Safety Reviewed  Home Safety Contact home health agency  [spoke with [facility] who reports patient was dischaged the beginning of July.]            Get Home Health Services      Activities and task to complete in order to accomplish goals.   I have contacted the In-Home Aide program [phone] at Department of Social Service you are now on the wait list I have spoken to social worker [name] I am glad Home Health Services with [facility] has started ( OT, PT and RN) per your request I have contacted Dr. [name] to updated the order and add an aide for you. You  and I spoke to [facility] [phone] today to get you set up for post discharge meals.  They verified that you qualify for 28 meals. The first 83 will be delivered by or before July 3rd and the second set of 14 will be delivered one week later.        Patient Stated (pt-stated)      Get stronger to be able to do things by herself        This is a list of the screening recommended for you and due dates:  Health Maintenance  Topic Date Due   Eye exam for diabetics  Never done   Zoster (Shingles) Vaccine (1 of 2) Never done   DTaP/Tdap/Td vaccine (2 - Tdap) 09/10/2018   COVID-19 Vaccine (6 - 2023-24 season) 04/19/2022   Flu Shot  11/10/2022   Complete foot exam   02/16/2023   Hemoglobin A1C  04/16/2023   Medicare Annual Wellness Visit  11/02/2023   Pneumonia Vaccine  Completed   DEXA scan (bone density measurement)  Completed   HPV Vaccine  Aged Out    Advanced directives: Please bring a copy of your health care power of attorney and living will to the office to be added to your chart at your convenience.   Conditions/risks identified: You are due for a tetanus shot. You can get this at your local pharmacy. Please bring records re: the shingles vaccines so that we can update your chart. Discuss with your provider about a bone density test. Your last one was in 2006 and it did show osteopenia.  Next appointment: Follow up in one year for your annual wellness visit 11/07/23 @ 10:20am   Preventive Care 65 Years and Older, Female Preventive care refers to lifestyle choices and visits with your health care provider that can promote health and wellness. What does  preventive care include? A yearly physical exam. This is also called an annual well check. Dental exams once or twice a year. Routine eye exams. Ask your health care provider how often you should have your eyes checked. Personal lifestyle choices, including: Daily care of your teeth and gums. Regular physical  activity. Eating a healthy diet. Avoiding tobacco and drug use. Limiting alcohol use. Practicing safe sex. Taking low-dose aspirin every day. Taking vitamin and mineral supplements as recommended by your health care provider. What happens during an annual well check? The services and screenings done by your health care provider during your annual well check will depend on your age, overall health, lifestyle risk factors, and family history of disease. Counseling  Your health care provider may ask you questions about your: Alcohol use. Tobacco use. Drug use. Emotional well-being. Home and relationship well-being. Sexual activity. Eating habits. History of falls. Memory and ability to understand (cognition). Work and work Astronomer. Reproductive health. Screening  You may have the following tests or measurements: Height, weight, and BMI. Blood pressure. Lipid and cholesterol levels. These may be checked every 5 years, or more frequently if you are over 26 years old. Skin check. Lung cancer screening. You may have this screening every year starting at age 99 if you have a 30-pack-year history of smoking and currently smoke or have quit within the past 15 years. Fecal occult blood test (FOBT) of the stool. You may have this test every year starting at age 45. Flexible sigmoidoscopy or colonoscopy. You may have a sigmoidoscopy every 5 years or a colonoscopy every 10 years starting at age 48. Hepatitis C blood test. Hepatitis B blood test. Sexually transmitted disease (STD) testing. Diabetes screening. This is done by checking your blood sugar (glucose) after you have not eaten for a while (fasting). You may have this done every 1-3 years. Bone density scan. This is done to screen for osteoporosis. You may have this done starting at age 60. Mammogram. This may be done every 1-2 years. Talk to your health care provider about how often you should have regular mammograms. Talk with your  health care provider about your test results, treatment options, and if necessary, the need for more tests. Vaccines  Your health care provider may recommend certain vaccines, such as: Influenza vaccine. This is recommended every year. Tetanus, diphtheria, and acellular pertussis (Tdap, Td) vaccine. You may need a Td booster every 10 years. Zoster vaccine. You may need this after age 48. Pneumococcal 13-valent conjugate (PCV13) vaccine. One dose is recommended after age 60. Pneumococcal polysaccharide (PPSV23) vaccine. One dose is recommended after age 59. Talk to your health care provider about which screenings and vaccines you need and how often you need them. This information is not intended to replace advice given to you by your health care provider. Make sure you discuss any questions you have with your health care provider. Document Released: 04/24/2015 Document Revised: 12/16/2015 Document Reviewed: 01/27/2015 Elsevier Interactive Patient Education  2017 [employer].  Fall Prevention in the Home Falls can cause injuries. They can happen to people of all ages. There are many things you can do to make your home safe and to help prevent falls. What can I do on the outside of my home? Regularly fix the edges of walkways and driveways and fix any cracks. Remove anything that might make you trip as you walk through a door, such as a raised step or threshold. Trim any bushes or trees on the path to  your home. Use bright outdoor lighting. Clear any walking paths of anything that might make someone trip, such as rocks or tools. Regularly check to see if handrails are loose or broken. Make sure that both sides of any steps have handrails. Any raised decks and porches should have guardrails on the edges. Have any leaves, snow, or ice cleared regularly. Use sand or salt on walking paths during winter. Clean up any spills in your garage right away. This includes oil or grease spills. What can I  do in the bathroom? Use night lights. Install grab bars by the toilet and in the tub and shower. Do not use towel bars as grab bars. Use non-skid mats or decals in the tub or shower. If you need to sit down in the shower, use a plastic, non-slip stool. Keep the floor dry. Clean up any water that spills on the floor as soon as it happens. Remove soap buildup in the tub or shower regularly. Attach bath mats securely with double-sided non-slip rug tape. Do not have throw rugs and other things on the floor that can make you trip. What can I do in the bedroom? Use night lights. Make sure that you have a light by your bed that is easy to reach. Do not use any sheets or blankets that are too big for your bed. They should not hang down onto the floor. Have a firm chair that has side arms. You can use this for support while you get dressed. Do not have throw rugs and other things on the floor that can make you trip. What can I do in the kitchen? Clean up any spills right away. Avoid walking on wet floors. Keep items that you use a lot in easy-to-reach places. If you need to reach something above you, use a strong step stool that has a grab bar. Keep electrical cords out of the way. Do not use floor polish or wax that makes floors slippery. If you must use wax, use non-skid floor wax. Do not have throw rugs and other things on the floor that can make you trip. What can I do with my stairs? Do not leave any items on the stairs. Make sure that there are handrails on both sides of the stairs and use them. Fix handrails that are broken or loose. Make sure that handrails are as long as the stairways. Check any carpeting to make sure that it is firmly attached to the stairs. Fix any carpet that is loose or worn. Avoid having throw rugs at the top or bottom of the stairs. If you do have throw rugs, attach them to the floor with carpet tape. Make sure that you have a light switch at the top of the stairs  and the bottom of the stairs. If you do not have them, ask someone to add them for you. What else can I do to help prevent falls? Wear shoes that: Do not have high heels. Have rubber bottoms. Are comfortable and fit you well. Are closed at the toe. Do not wear sandals. If you use a stepladder: Make sure that it is fully opened. Do not climb a closed stepladder. Make sure that both sides of the stepladder are locked into place. Ask someone to hold it for you, if possible. Clearly mark and make sure that you can see: Any grab bars or handrails. First and last steps. Where the edge of each step is. Use tools that help you move around (mobility aids) if  they are needed. These include: Canes. Walkers. Scooters. Crutches. Turn on the lights when you go into a dark area. Replace any light bulbs as soon as they burn out. Set up your furniture so you have a clear path. Avoid moving your furniture around. If any of your floors are uneven, fix them. If there are any pets around you, be aware of where they are. Review your medicines with your doctor. Some medicines can make you feel dizzy. This can increase your chance of falling. Ask your doctor what other things that you can do to help prevent falls. This information is not intended to replace advice given to you by your health care provider. Make sure you discuss any questions you have with your health care provider. Document Released: 01/22/2009 Document Revised: 09/03/2015 Document Reviewed: 05/02/2014 Elsevier Interactive Patient Education  2017 [employer].

## 2022-11-07 ENCOUNTER — Other Ambulatory Visit: Payer: Self-pay | Admitting: Family Medicine

## 2022-11-07 ENCOUNTER — Ambulatory Visit: Payer: Self-pay | Admitting: Licensed Clinical Social Worker

## 2022-11-07 DIAGNOSIS — R197 Diarrhea, unspecified: Secondary | ICD-10-CM

## 2022-11-07 NOTE — Patient Instructions (Signed)
Social Work Visit Information  Thank you for taking time to visit with me today. Please don't hesitate to contact me if I can be of assistance to you.   Following are the goals we discussed today:   Goals Addressed             This Visit's Progress    Get Home Health Services       Activities and task to complete in order to accomplish goals.   I have contacted the In-Home Aide program [phone] at Department of Social Service you are now on the wait list I have spoken to social worker [name] I am glad Home Health Services with [facility] has started ( OT, PT and RN and you now has an aide to help with your bath)  Advance Directive information has been mailed          No follow up scheduled with social work at this time. Will follow up in 2 weeks .Patient will call office if needed prior to next encounter.  Please call the care guide team at [phone] if you need to cancel or reschedule your appointment.    The patient verbalized understanding of instructions, educational materials, and care plan provided today and DECLINED offer to receive copy of patient instructions, educational materials, and care plan.    Samantha Hines, LCSW Social Work Care Coordination  [name] [employer] [phone]

## 2022-11-07 NOTE — Patient Outreach (Signed)
  Care Coordination  Follow Up Visit Note   11/07/2022 Name: [name] MRN: [id number] DOB: [birth year]  [name] is a 89 y.o. year old female who sees [name], Samantha Congress, DO for primary care. I spoke with  [name] by phone today.  What matters to the patients health and wellness today?  Getting her bath from her aide    Goals Addressed             This Visit's Progress    Get Home Health Services       Activities and task to complete in order to accomplish goals.   I have contacted the In-Home Aide program [phone] at Department of Social Service you are now on the wait list I have spoken to social worker [name] I am glad Home Health Services with [facility] has started ( OT, PT and RN and you now has an aide to help with your bath)  Advance Directive information has been mailed         SDOH assessments and interventions completed:  No  Care Coordination Interventions:  Yes, provided   Follow up plan:  no f/u scheduled will f/u in 2 weeks    Encounter Outcome:  Pt. Visit Completed   Samantha Hines, LCSW Social Work Care Coordination  [facility] [name] [name] [phone]

## 2022-11-09 ENCOUNTER — Other Ambulatory Visit: Payer: Self-pay | Admitting: Family Medicine

## 2022-11-11 DIAGNOSIS — E039 Hypothyroidism, unspecified: Secondary | ICD-10-CM | POA: Diagnosis not present

## 2022-11-11 DIAGNOSIS — E11620 Type 2 diabetes mellitus with diabetic dermatitis: Secondary | ICD-10-CM | POA: Diagnosis not present

## 2022-11-11 DIAGNOSIS — I5032 Chronic diastolic (congestive) heart failure: Secondary | ICD-10-CM | POA: Diagnosis not present

## 2022-11-11 DIAGNOSIS — M1030 Gout due to renal impairment, unspecified site: Secondary | ICD-10-CM | POA: Diagnosis not present

## 2022-11-11 DIAGNOSIS — E1151 Type 2 diabetes mellitus with diabetic peripheral angiopathy without gangrene: Secondary | ICD-10-CM | POA: Diagnosis not present

## 2022-11-11 DIAGNOSIS — K5730 Diverticulosis of large intestine without perforation or abscess without bleeding: Secondary | ICD-10-CM | POA: Diagnosis not present

## 2022-11-11 DIAGNOSIS — E785 Hyperlipidemia, unspecified: Secondary | ICD-10-CM | POA: Diagnosis not present

## 2022-11-11 DIAGNOSIS — K3184 Gastroparesis: Secondary | ICD-10-CM | POA: Diagnosis not present

## 2022-11-11 DIAGNOSIS — M5136 Other intervertebral disc degeneration, lumbar region: Secondary | ICD-10-CM | POA: Diagnosis not present

## 2022-11-11 DIAGNOSIS — E1122 Type 2 diabetes mellitus with diabetic chronic kidney disease: Secondary | ICD-10-CM | POA: Diagnosis not present

## 2022-11-11 DIAGNOSIS — D631 Anemia in chronic kidney disease: Secondary | ICD-10-CM | POA: Diagnosis not present

## 2022-11-11 DIAGNOSIS — M25562 Pain in left knee: Secondary | ICD-10-CM | POA: Diagnosis not present

## 2022-11-11 DIAGNOSIS — E1143 Type 2 diabetes mellitus with diabetic autonomic (poly)neuropathy: Secondary | ICD-10-CM | POA: Diagnosis not present

## 2022-11-11 DIAGNOSIS — I130 Hypertensive heart and chronic kidney disease with heart failure and stage 1 through stage 4 chronic kidney disease, or unspecified chronic kidney disease: Secondary | ICD-10-CM | POA: Diagnosis not present

## 2022-11-11 DIAGNOSIS — M48061 Spinal stenosis, lumbar region without neurogenic claudication: Secondary | ICD-10-CM | POA: Diagnosis not present

## 2022-11-11 DIAGNOSIS — I2510 Atherosclerotic heart disease of native coronary artery without angina pectoris: Secondary | ICD-10-CM | POA: Diagnosis not present

## 2022-11-11 DIAGNOSIS — K2270 Barrett's esophagus without dysplasia: Secondary | ICD-10-CM | POA: Diagnosis not present

## 2022-11-11 DIAGNOSIS — M8580 Other specified disorders of bone density and structure, unspecified site: Secondary | ICD-10-CM | POA: Diagnosis not present

## 2022-11-11 DIAGNOSIS — I495 Sick sinus syndrome: Secondary | ICD-10-CM | POA: Diagnosis not present

## 2022-11-11 DIAGNOSIS — M1612 Unilateral primary osteoarthritis, left hip: Secondary | ICD-10-CM | POA: Diagnosis not present

## 2022-11-11 DIAGNOSIS — K219 Gastro-esophageal reflux disease without esophagitis: Secondary | ICD-10-CM | POA: Diagnosis not present

## 2022-11-11 DIAGNOSIS — N183 Chronic kidney disease, stage 3 unspecified: Secondary | ICD-10-CM | POA: Diagnosis not present

## 2022-11-14 ENCOUNTER — Encounter: Payer: Self-pay | Admitting: Podiatry

## 2022-11-14 ENCOUNTER — Ambulatory Visit: Payer: Medicare Other | Admitting: Podiatry

## 2022-11-14 DIAGNOSIS — B351 Tinea unguium: Secondary | ICD-10-CM

## 2022-11-14 DIAGNOSIS — M79675 Pain in left toe(s): Secondary | ICD-10-CM | POA: Diagnosis not present

## 2022-11-14 DIAGNOSIS — M722 Plantar fascial fibromatosis: Secondary | ICD-10-CM

## 2022-11-14 DIAGNOSIS — M79674 Pain in right toe(s): Secondary | ICD-10-CM

## 2022-11-14 MED ORDER — TRIAMCINOLONE ACETONIDE 10 MG/ML IJ SUSP
10.0000 mg | Freq: Once | INTRAMUSCULAR | Status: AC
Start: 2022-11-14 — End: 2022-11-14
  Administered 2022-11-14: 10 mg via INTRA_ARTICULAR

## 2022-11-14 NOTE — Progress Notes (Signed)
Subjective:   Patient ID: [name], female   DOB: 89 y.o.   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

## 2022-11-15 ENCOUNTER — Telehealth: Payer: Self-pay | Admitting: Family Medicine

## 2022-11-15 ENCOUNTER — Other Ambulatory Visit: Payer: Self-pay | Admitting: Cardiovascular Disease

## 2022-11-15 DIAGNOSIS — N183 Chronic kidney disease, stage 3 unspecified: Secondary | ICD-10-CM | POA: Diagnosis not present

## 2022-11-15 DIAGNOSIS — K5730 Diverticulosis of large intestine without perforation or abscess without bleeding: Secondary | ICD-10-CM | POA: Diagnosis not present

## 2022-11-15 DIAGNOSIS — M1030 Gout due to renal impairment, unspecified site: Secondary | ICD-10-CM | POA: Diagnosis not present

## 2022-11-15 DIAGNOSIS — I495 Sick sinus syndrome: Secondary | ICD-10-CM | POA: Diagnosis not present

## 2022-11-15 DIAGNOSIS — E1143 Type 2 diabetes mellitus with diabetic autonomic (poly)neuropathy: Secondary | ICD-10-CM | POA: Diagnosis not present

## 2022-11-15 DIAGNOSIS — K2270 Barrett's esophagus without dysplasia: Secondary | ICD-10-CM | POA: Diagnosis not present

## 2022-11-15 DIAGNOSIS — E1151 Type 2 diabetes mellitus with diabetic peripheral angiopathy without gangrene: Secondary | ICD-10-CM | POA: Diagnosis not present

## 2022-11-15 DIAGNOSIS — E785 Hyperlipidemia, unspecified: Secondary | ICD-10-CM | POA: Diagnosis not present

## 2022-11-15 DIAGNOSIS — I2510 Atherosclerotic heart disease of native coronary artery without angina pectoris: Secondary | ICD-10-CM | POA: Diagnosis not present

## 2022-11-15 DIAGNOSIS — M25562 Pain in left knee: Secondary | ICD-10-CM | POA: Diagnosis not present

## 2022-11-15 DIAGNOSIS — I130 Hypertensive heart and chronic kidney disease with heart failure and stage 1 through stage 4 chronic kidney disease, or unspecified chronic kidney disease: Secondary | ICD-10-CM | POA: Diagnosis not present

## 2022-11-15 DIAGNOSIS — E11620 Type 2 diabetes mellitus with diabetic dermatitis: Secondary | ICD-10-CM | POA: Diagnosis not present

## 2022-11-15 DIAGNOSIS — M48061 Spinal stenosis, lumbar region without neurogenic claudication: Secondary | ICD-10-CM | POA: Diagnosis not present

## 2022-11-15 DIAGNOSIS — K219 Gastro-esophageal reflux disease without esophagitis: Secondary | ICD-10-CM | POA: Diagnosis not present

## 2022-11-15 DIAGNOSIS — M8580 Other specified disorders of bone density and structure, unspecified site: Secondary | ICD-10-CM | POA: Diagnosis not present

## 2022-11-15 DIAGNOSIS — E039 Hypothyroidism, unspecified: Secondary | ICD-10-CM | POA: Diagnosis not present

## 2022-11-15 DIAGNOSIS — M1612 Unilateral primary osteoarthritis, left hip: Secondary | ICD-10-CM | POA: Diagnosis not present

## 2022-11-15 DIAGNOSIS — D631 Anemia in chronic kidney disease: Secondary | ICD-10-CM | POA: Diagnosis not present

## 2022-11-15 DIAGNOSIS — M5136 Other intervertebral disc degeneration, lumbar region: Secondary | ICD-10-CM | POA: Diagnosis not present

## 2022-11-15 DIAGNOSIS — K3184 Gastroparesis: Secondary | ICD-10-CM | POA: Diagnosis not present

## 2022-11-15 DIAGNOSIS — E1122 Type 2 diabetes mellitus with diabetic chronic kidney disease: Secondary | ICD-10-CM | POA: Diagnosis not present

## 2022-11-15 DIAGNOSIS — I5032 Chronic diastolic (congestive) heart failure: Secondary | ICD-10-CM | POA: Diagnosis not present

## 2022-11-15 NOTE — Telephone Encounter (Signed)
[name] from [facility] called and requested verbal orders for health aid to assit with bathing and dressing for next 4 weeks starting this week for the pt. Please call and advise at [phone].  Voicemail bo is secure.

## 2022-11-16 DIAGNOSIS — I5032 Chronic diastolic (congestive) heart failure: Secondary | ICD-10-CM | POA: Diagnosis not present

## 2022-11-16 DIAGNOSIS — E785 Hyperlipidemia, unspecified: Secondary | ICD-10-CM | POA: Diagnosis not present

## 2022-11-16 DIAGNOSIS — M5136 Other intervertebral disc degeneration, lumbar region: Secondary | ICD-10-CM | POA: Diagnosis not present

## 2022-11-16 DIAGNOSIS — I130 Hypertensive heart and chronic kidney disease with heart failure and stage 1 through stage 4 chronic kidney disease, or unspecified chronic kidney disease: Secondary | ICD-10-CM | POA: Diagnosis not present

## 2022-11-16 DIAGNOSIS — E1122 Type 2 diabetes mellitus with diabetic chronic kidney disease: Secondary | ICD-10-CM | POA: Diagnosis not present

## 2022-11-16 DIAGNOSIS — M25562 Pain in left knee: Secondary | ICD-10-CM | POA: Diagnosis not present

## 2022-11-16 DIAGNOSIS — K5730 Diverticulosis of large intestine without perforation or abscess without bleeding: Secondary | ICD-10-CM | POA: Diagnosis not present

## 2022-11-16 DIAGNOSIS — M48061 Spinal stenosis, lumbar region without neurogenic claudication: Secondary | ICD-10-CM | POA: Diagnosis not present

## 2022-11-16 DIAGNOSIS — I2510 Atherosclerotic heart disease of native coronary artery without angina pectoris: Secondary | ICD-10-CM | POA: Diagnosis not present

## 2022-11-16 DIAGNOSIS — I495 Sick sinus syndrome: Secondary | ICD-10-CM | POA: Diagnosis not present

## 2022-11-16 DIAGNOSIS — N183 Chronic kidney disease, stage 3 unspecified: Secondary | ICD-10-CM | POA: Diagnosis not present

## 2022-11-16 DIAGNOSIS — K3184 Gastroparesis: Secondary | ICD-10-CM | POA: Diagnosis not present

## 2022-11-16 DIAGNOSIS — E11620 Type 2 diabetes mellitus with diabetic dermatitis: Secondary | ICD-10-CM | POA: Diagnosis not present

## 2022-11-16 DIAGNOSIS — E039 Hypothyroidism, unspecified: Secondary | ICD-10-CM | POA: Diagnosis not present

## 2022-11-16 DIAGNOSIS — E1151 Type 2 diabetes mellitus with diabetic peripheral angiopathy without gangrene: Secondary | ICD-10-CM | POA: Diagnosis not present

## 2022-11-16 DIAGNOSIS — M1030 Gout due to renal impairment, unspecified site: Secondary | ICD-10-CM | POA: Diagnosis not present

## 2022-11-16 DIAGNOSIS — M8580 Other specified disorders of bone density and structure, unspecified site: Secondary | ICD-10-CM | POA: Diagnosis not present

## 2022-11-16 DIAGNOSIS — E1143 Type 2 diabetes mellitus with diabetic autonomic (poly)neuropathy: Secondary | ICD-10-CM | POA: Diagnosis not present

## 2022-11-16 DIAGNOSIS — K2270 Barrett's esophagus without dysplasia: Secondary | ICD-10-CM | POA: Diagnosis not present

## 2022-11-16 DIAGNOSIS — D631 Anemia in chronic kidney disease: Secondary | ICD-10-CM | POA: Diagnosis not present

## 2022-11-16 DIAGNOSIS — M1612 Unilateral primary osteoarthritis, left hip: Secondary | ICD-10-CM | POA: Diagnosis not present

## 2022-11-16 DIAGNOSIS — K219 Gastro-esophageal reflux disease without esophagitis: Secondary | ICD-10-CM | POA: Diagnosis not present

## 2022-11-16 NOTE — Telephone Encounter (Signed)
VM left with confirmation

## 2022-11-18 NOTE — Progress Notes (Signed)
Remote pacemaker transmission.   

## 2022-11-21 ENCOUNTER — Ambulatory Visit: Payer: Self-pay | Admitting: Licensed Clinical Social Worker

## 2022-11-21 NOTE — Patient Instructions (Signed)
Social Work Visit Information  Thank you for taking time to visit with me today. Please don't hesitate to contact me if I can be of assistance to you.   Following are the goals we discussed today:   Goals Addressed             This Visit's Progress    COMPLETED: Get Home Health Services       Activities and task to complete in order to accomplish goals.   I have contacted the In-Home Aide program [phone] at Department of Social Service you are now on the wait list I have spoken to social worker [name] I am glad Home Health Services have started ( OT, PT and RN , per note from PCP's office orders have been provided for an aide)  Pleas continue to work on Psychologist, counselling and provide a copy to the Doctor's office          Patient does not require continued follow-up by social work. They will contact the office if needed  Please call the care guide team at [phone] if you need to cancel or reschedule your appointment.    The patient verbalized understanding of instructions, educational materials, and care plan provided today and DECLINED offer to receive copy of patient instructions, educational materials, and care plan.     [name], LCSW Social Work Care Coordination  [name] [employer] [phone]

## 2022-11-21 NOTE — Patient Outreach (Signed)
  Care Coordination  Follow Up Visit Note   11/21/2022 Name: [name] MRN: [id number] DOB: January 07, 1933  [name] is a 89 y.o. year old female who sees [name], Grayling Congress, DO for primary care. I spoke with  [name] by phone today.  What matters to the patients health and wellness today?  Getting home health services  Patient currently has home health support.  RN care manager will continue to follow patient for ongoing needs.  Will consult LCSW as needed.  No new needs identified during this encounter. Reminded patient of upcoming appointments.    Goals Addressed             This Visit's Progress    COMPLETED: Get Home Health Services       Activities and task to complete in order to accomplish goals.   I have contacted the In-Home Aide program [phone] at [facility] you are now on the wait list I have spoken to social worker [name] I am glad Home Health Services have started ( OT, PT and RN , per note from PCP's office orders have been provided for an aide)  Pleas continue to work on Psychologist, counselling and provide a copy to the [employer] office         SDOH assessments and interventions completed:  No   Care Coordination Interventions:  Yes, provided  Interventions Today    Flowsheet Row Most Recent Value  Chronic Disease   Chronic disease during today's visit Hypertension (HTN), Diabetes, Congestive Heart Failure (CHF), Chronic Kidney Disease/End Stage Renal Disease (ESRD)  General Interventions   General Interventions Discussed/Reviewed General Interventions Reviewed, Level of Care  Safety Interventions   Safety Discussed/Reviewed Home Safety  [PT continues to come to patient's home, HH Aide has not started however PCP has provided verbal orders]  Advanced Directive Interventions   Advanced Directives Discussed/Reviewed Advanced Directives Reviewed  [received documents will get family to assist her with  completing]       Follow up plan: No further intervention required.   Encounter Outcome:  Pt. Visit Completed   [name], LCSW Social Work Care Coordination  [name] [employer] [phone]

## 2022-11-22 DIAGNOSIS — E785 Hyperlipidemia, unspecified: Secondary | ICD-10-CM | POA: Diagnosis not present

## 2022-11-22 DIAGNOSIS — M5136 Other intervertebral disc degeneration, lumbar region: Secondary | ICD-10-CM | POA: Diagnosis not present

## 2022-11-22 DIAGNOSIS — D631 Anemia in chronic kidney disease: Secondary | ICD-10-CM | POA: Diagnosis not present

## 2022-11-22 DIAGNOSIS — K3184 Gastroparesis: Secondary | ICD-10-CM | POA: Diagnosis not present

## 2022-11-22 DIAGNOSIS — E1143 Type 2 diabetes mellitus with diabetic autonomic (poly)neuropathy: Secondary | ICD-10-CM | POA: Diagnosis not present

## 2022-11-22 DIAGNOSIS — K2270 Barrett's esophagus without dysplasia: Secondary | ICD-10-CM | POA: Diagnosis not present

## 2022-11-22 DIAGNOSIS — E1122 Type 2 diabetes mellitus with diabetic chronic kidney disease: Secondary | ICD-10-CM | POA: Diagnosis not present

## 2022-11-22 DIAGNOSIS — I2510 Atherosclerotic heart disease of native coronary artery without angina pectoris: Secondary | ICD-10-CM | POA: Diagnosis not present

## 2022-11-22 DIAGNOSIS — E1151 Type 2 diabetes mellitus with diabetic peripheral angiopathy without gangrene: Secondary | ICD-10-CM | POA: Diagnosis not present

## 2022-11-22 DIAGNOSIS — I495 Sick sinus syndrome: Secondary | ICD-10-CM | POA: Diagnosis not present

## 2022-11-22 DIAGNOSIS — E039 Hypothyroidism, unspecified: Secondary | ICD-10-CM | POA: Diagnosis not present

## 2022-11-22 DIAGNOSIS — M48061 Spinal stenosis, lumbar region without neurogenic claudication: Secondary | ICD-10-CM | POA: Diagnosis not present

## 2022-11-22 DIAGNOSIS — M25562 Pain in left knee: Secondary | ICD-10-CM | POA: Diagnosis not present

## 2022-11-22 DIAGNOSIS — I5032 Chronic diastolic (congestive) heart failure: Secondary | ICD-10-CM | POA: Diagnosis not present

## 2022-11-22 DIAGNOSIS — M8580 Other specified disorders of bone density and structure, unspecified site: Secondary | ICD-10-CM | POA: Diagnosis not present

## 2022-11-22 DIAGNOSIS — I130 Hypertensive heart and chronic kidney disease with heart failure and stage 1 through stage 4 chronic kidney disease, or unspecified chronic kidney disease: Secondary | ICD-10-CM | POA: Diagnosis not present

## 2022-11-22 DIAGNOSIS — K5730 Diverticulosis of large intestine without perforation or abscess without bleeding: Secondary | ICD-10-CM | POA: Diagnosis not present

## 2022-11-22 DIAGNOSIS — M1030 Gout due to renal impairment, unspecified site: Secondary | ICD-10-CM | POA: Diagnosis not present

## 2022-11-22 DIAGNOSIS — M1612 Unilateral primary osteoarthritis, left hip: Secondary | ICD-10-CM | POA: Diagnosis not present

## 2022-11-22 DIAGNOSIS — K219 Gastro-esophageal reflux disease without esophagitis: Secondary | ICD-10-CM | POA: Diagnosis not present

## 2022-11-22 DIAGNOSIS — N183 Chronic kidney disease, stage 3 unspecified: Secondary | ICD-10-CM | POA: Diagnosis not present

## 2022-11-22 DIAGNOSIS — E11620 Type 2 diabetes mellitus with diabetic dermatitis: Secondary | ICD-10-CM | POA: Diagnosis not present

## 2022-11-24 DIAGNOSIS — I5032 Chronic diastolic (congestive) heart failure: Secondary | ICD-10-CM | POA: Diagnosis not present

## 2022-11-24 DIAGNOSIS — M25562 Pain in left knee: Secondary | ICD-10-CM | POA: Diagnosis not present

## 2022-11-24 DIAGNOSIS — E1122 Type 2 diabetes mellitus with diabetic chronic kidney disease: Secondary | ICD-10-CM | POA: Diagnosis not present

## 2022-11-24 DIAGNOSIS — M1612 Unilateral primary osteoarthritis, left hip: Secondary | ICD-10-CM | POA: Diagnosis not present

## 2022-11-24 DIAGNOSIS — K3184 Gastroparesis: Secondary | ICD-10-CM | POA: Diagnosis not present

## 2022-11-24 DIAGNOSIS — K5730 Diverticulosis of large intestine without perforation or abscess without bleeding: Secondary | ICD-10-CM | POA: Diagnosis not present

## 2022-11-24 DIAGNOSIS — K2270 Barrett's esophagus without dysplasia: Secondary | ICD-10-CM | POA: Diagnosis not present

## 2022-11-24 DIAGNOSIS — I2510 Atherosclerotic heart disease of native coronary artery without angina pectoris: Secondary | ICD-10-CM | POA: Diagnosis not present

## 2022-11-24 DIAGNOSIS — I495 Sick sinus syndrome: Secondary | ICD-10-CM | POA: Diagnosis not present

## 2022-11-24 DIAGNOSIS — M5136 Other intervertebral disc degeneration, lumbar region: Secondary | ICD-10-CM | POA: Diagnosis not present

## 2022-11-24 DIAGNOSIS — E039 Hypothyroidism, unspecified: Secondary | ICD-10-CM | POA: Diagnosis not present

## 2022-11-24 DIAGNOSIS — I130 Hypertensive heart and chronic kidney disease with heart failure and stage 1 through stage 4 chronic kidney disease, or unspecified chronic kidney disease: Secondary | ICD-10-CM | POA: Diagnosis not present

## 2022-11-24 DIAGNOSIS — N183 Chronic kidney disease, stage 3 unspecified: Secondary | ICD-10-CM | POA: Diagnosis not present

## 2022-11-24 DIAGNOSIS — E1151 Type 2 diabetes mellitus with diabetic peripheral angiopathy without gangrene: Secondary | ICD-10-CM | POA: Diagnosis not present

## 2022-11-24 DIAGNOSIS — K219 Gastro-esophageal reflux disease without esophagitis: Secondary | ICD-10-CM | POA: Diagnosis not present

## 2022-11-24 DIAGNOSIS — M1030 Gout due to renal impairment, unspecified site: Secondary | ICD-10-CM | POA: Diagnosis not present

## 2022-11-24 DIAGNOSIS — E785 Hyperlipidemia, unspecified: Secondary | ICD-10-CM | POA: Diagnosis not present

## 2022-11-24 DIAGNOSIS — M48061 Spinal stenosis, lumbar region without neurogenic claudication: Secondary | ICD-10-CM | POA: Diagnosis not present

## 2022-11-24 DIAGNOSIS — E1143 Type 2 diabetes mellitus with diabetic autonomic (poly)neuropathy: Secondary | ICD-10-CM | POA: Diagnosis not present

## 2022-11-24 DIAGNOSIS — M8580 Other specified disorders of bone density and structure, unspecified site: Secondary | ICD-10-CM | POA: Diagnosis not present

## 2022-11-24 DIAGNOSIS — D631 Anemia in chronic kidney disease: Secondary | ICD-10-CM | POA: Diagnosis not present

## 2022-11-24 DIAGNOSIS — E11620 Type 2 diabetes mellitus with diabetic dermatitis: Secondary | ICD-10-CM | POA: Diagnosis not present

## 2022-11-28 ENCOUNTER — Ambulatory Visit: Payer: Self-pay | Admitting: Licensed Clinical Social Worker

## 2022-11-28 DIAGNOSIS — K3184 Gastroparesis: Secondary | ICD-10-CM | POA: Diagnosis not present

## 2022-11-28 DIAGNOSIS — I5032 Chronic diastolic (congestive) heart failure: Secondary | ICD-10-CM | POA: Diagnosis not present

## 2022-11-28 DIAGNOSIS — E1143 Type 2 diabetes mellitus with diabetic autonomic (poly)neuropathy: Secondary | ICD-10-CM | POA: Diagnosis not present

## 2022-11-28 DIAGNOSIS — M8580 Other specified disorders of bone density and structure, unspecified site: Secondary | ICD-10-CM | POA: Diagnosis not present

## 2022-11-28 DIAGNOSIS — I2510 Atherosclerotic heart disease of native coronary artery without angina pectoris: Secondary | ICD-10-CM | POA: Diagnosis not present

## 2022-11-28 DIAGNOSIS — E785 Hyperlipidemia, unspecified: Secondary | ICD-10-CM | POA: Diagnosis not present

## 2022-11-28 DIAGNOSIS — K2270 Barrett's esophagus without dysplasia: Secondary | ICD-10-CM | POA: Diagnosis not present

## 2022-11-28 DIAGNOSIS — E039 Hypothyroidism, unspecified: Secondary | ICD-10-CM | POA: Diagnosis not present

## 2022-11-28 DIAGNOSIS — I495 Sick sinus syndrome: Secondary | ICD-10-CM | POA: Diagnosis not present

## 2022-11-28 DIAGNOSIS — I130 Hypertensive heart and chronic kidney disease with heart failure and stage 1 through stage 4 chronic kidney disease, or unspecified chronic kidney disease: Secondary | ICD-10-CM | POA: Diagnosis not present

## 2022-11-28 DIAGNOSIS — K219 Gastro-esophageal reflux disease without esophagitis: Secondary | ICD-10-CM | POA: Diagnosis not present

## 2022-11-28 DIAGNOSIS — M1030 Gout due to renal impairment, unspecified site: Secondary | ICD-10-CM | POA: Diagnosis not present

## 2022-11-28 DIAGNOSIS — E11620 Type 2 diabetes mellitus with diabetic dermatitis: Secondary | ICD-10-CM | POA: Diagnosis not present

## 2022-11-28 DIAGNOSIS — D631 Anemia in chronic kidney disease: Secondary | ICD-10-CM | POA: Diagnosis not present

## 2022-11-28 DIAGNOSIS — K5730 Diverticulosis of large intestine without perforation or abscess without bleeding: Secondary | ICD-10-CM | POA: Diagnosis not present

## 2022-11-28 DIAGNOSIS — E1151 Type 2 diabetes mellitus with diabetic peripheral angiopathy without gangrene: Secondary | ICD-10-CM | POA: Diagnosis not present

## 2022-11-28 DIAGNOSIS — M5136 Other intervertebral disc degeneration, lumbar region: Secondary | ICD-10-CM | POA: Diagnosis not present

## 2022-11-28 DIAGNOSIS — E1122 Type 2 diabetes mellitus with diabetic chronic kidney disease: Secondary | ICD-10-CM | POA: Diagnosis not present

## 2022-11-28 DIAGNOSIS — M1612 Unilateral primary osteoarthritis, left hip: Secondary | ICD-10-CM | POA: Diagnosis not present

## 2022-11-28 DIAGNOSIS — N183 Chronic kidney disease, stage 3 unspecified: Secondary | ICD-10-CM | POA: Diagnosis not present

## 2022-11-28 DIAGNOSIS — M25562 Pain in left knee: Secondary | ICD-10-CM | POA: Diagnosis not present

## 2022-11-28 DIAGNOSIS — M48061 Spinal stenosis, lumbar region without neurogenic claudication: Secondary | ICD-10-CM | POA: Diagnosis not present

## 2022-11-28 NOTE — Patient Outreach (Signed)
  Care Coordination  Follow Up Visit Note   11/28/2022 Name: ANASTASIJA COCKBURN MRN: [id number] DOB: 01/31/1933  [name] Pascua is a 89 y.o. year old female who sees [name], Grayling Congress, DO for primary care. I spoke with  [name] 's daughter Clydie Braun by phone today. Returned phone call from voice message.  What matters to the patients health and wellness today?    Patient's daughter Clydie Braun provided all information during this encounter. Patient received advance directive information in the mail.  She has questions and wanted LCSW to explain it to her daughter .  Per daughter she has POA documents for patient, however patient does not have a Health Care POA. Or advance directive.  She will work with patient to get documents completed.  SDOH assessments and interventions completed:  No   Care Coordination Interventions:  Yes, provided  Interventions Today    Flowsheet Row Most Recent Value  Education Interventions   Education Provided Provided Education  Advanced Directive Interventions   Advanced Directives Discussed/Reviewed Advanced Directives Discussed  [reviewed information]       Follow up plan: No further intervention required.   Encounter Outcome:  Pt. Visit Completed   [name], LCSW Social Work Care Coordination  [name] [employer] [phone]

## 2022-11-28 NOTE — Patient Instructions (Signed)
  Patient was not contacted during this encounter.  LCSW collaborated with patient's daughter to provided education on advance directive   [name], LCSW Social Work Care Coordination  [phone]

## 2022-11-29 ENCOUNTER — Ambulatory Visit: Payer: Self-pay

## 2022-11-29 ENCOUNTER — Encounter: Payer: Self-pay | Admitting: Family Medicine

## 2022-11-29 ENCOUNTER — Other Ambulatory Visit: Payer: Self-pay | Admitting: Family Medicine

## 2022-11-29 ENCOUNTER — Telehealth: Payer: Self-pay | Admitting: Family Medicine

## 2022-11-29 MED ORDER — AMLODIPINE BESYLATE 5 MG PO TABS: 5.0000 mg | ORAL_TABLET | Freq: Two times a day (BID) | ORAL | 3 refills | Status: DC

## 2022-11-29 MED ORDER — AMLODIPINE BESYLATE 5 MG PO TABS: 5.0000 mg | ORAL_TABLET | Freq: Two times a day (BID) | ORAL | 0 refills | Status: DC

## 2022-11-29 NOTE — Patient Outreach (Signed)
  Care Coordination   In Person Provider Office Visit Note   11/29/2022 Name: [name] MRN: [id number] DOB: [birth year]  [name] is a 89 y.o. year old female who sees [name], Grayling Congress, DO for primary care. I spoke with  [name] by phone today.  What matters to the patients health and wellness today?  Per patient, "I think I am doing pretty fair". She reports completed a cardiology visit on 11/01/22. Expresses only concern is that she is in need of a refill on amlodipine. She states it was last filled by another provider that she is no longer seeing. She states she usually get medicaitons from [facility], but they told her she needs a new prescription. [name] reports only one pill left. She is requesting a Prescription be sent into [facility] on [address] (phone number [phone]). [name] states she is going to call primary provider office upon hanging up from this telephone call.  Goals Addressed             This Visit's Progress    Assist with health management post rehab stay       Interventions Today    Flowsheet Row Most Recent Value  Chronic Disease   Chronic disease during today's visit Diabetes, Hypertension (HTN), Chronic Kidney Disease/End Stage Renal Disease (ESRD)  General Interventions   General Interventions Discussed/Reviewed General Interventions Reviewed, Doctor Visits, Communication with  Doctor Visits Discussed/Reviewed Doctor Visits Discussed  [upcoming appointment reviewed]  Communication with PCP/Specialists  [update re: need for amlodipine prescription and patient request to send to [facility] pharmacy]  Education Interventions   Education Provided Provided Education  Provided Verbal Education On Other, Medication  [discussed prescription refill process, advised continue to take medications as scheduled, advised to attend provider visits as scheduled.]  Pharmacy Interventions   Pharmacy Dicussed/Reviewed Pharmacy Topics Reviewed   [advised to contact provider for refill request]  Safety Interventions   Safety Discussed/Reviewed Home Safety  [confirmed patient is active with home health [facility] for physical therapy]            SDOH assessments and interventions completed:  No  Care Coordination Interventions:  Yes, provided   Follow up plan: Follow up call scheduled for 12/30/22    Encounter Outcome:  Pt. Visit Completed   Kathyrn Sheriff, RN, MSN, BSN, CCM Care Management Coordinator [phone]

## 2022-11-29 NOTE — Patient Instructions (Signed)
Visit Information  Thank you for taking time to visit with me today. Please don't hesitate to contact me if I can be of assistance to you.   Following are the goals we discussed today:   Goals Addressed             This Visit's Progress    Assist with health management post rehab stay       Interventions Today    Flowsheet Row Most Recent Value  Chronic Disease   Chronic disease during today's visit Diabetes, Hypertension (HTN), Chronic Kidney Disease/End Stage Renal Disease (ESRD)  General Interventions   General Interventions Discussed/Reviewed General Interventions Reviewed, Doctor Visits, Communication with  Doctor Visits Discussed/Reviewed Doctor Visits Discussed  [upcoming appointment reviewed]  Communication with PCP/Specialists  [update re: need for amlodipine prescription and patient request to send to [facility] pharmacy]  Education Interventions   Education Provided Provided Education  Provided Verbal Education On Other, Medication  [discussed prescription refill process, advised continue to take medications as scheduled, advised to attend provider visits as scheduled.]  Pharmacy Interventions   Pharmacy Dicussed/Reviewed Pharmacy Topics Reviewed  [advised to contact provider for refill request]  Safety Interventions   Safety Discussed/Reviewed Home Safety  [confirmed patient is active with home health [facility] for physical therapy]            Our next appointment is by telephone on 12/30/22 at 11:30 am  Please call the care guide team at [phone] if you need to cancel or reschedule your appointment.   If you are experiencing a Mental Health or Behavioral Health Crisis or need someone to talk to, please call the Suicide and Crisis Lifeline: 988 call the [place] National Suicide Prevention Lifeline: (731)203-5270 or TTY: (431)462-3826 TTY 2055548731) to talk to a trained counselor call 1-800-273-TALK (toll free, 24 hour hotline)  Kathyrn Sheriff, RN, MSN, BSN,  CCM Care Coordinator [phone]

## 2022-11-29 NOTE — Telephone Encounter (Signed)
Patient called and needs a med refill on  Amlodipine 5 mg and please send to [facility]

## 2022-11-29 NOTE — Telephone Encounter (Signed)
Please advise. Medication is under historical provider and has not been prescribed by you.

## 2022-11-29 NOTE — Addendum Note (Signed)
Addended by: Roxanne Gates on: 11/29/2022 03:25 PM   Modules accepted: Orders

## 2022-11-30 DIAGNOSIS — I5032 Chronic diastolic (congestive) heart failure: Secondary | ICD-10-CM | POA: Diagnosis not present

## 2022-11-30 DIAGNOSIS — E039 Hypothyroidism, unspecified: Secondary | ICD-10-CM | POA: Diagnosis not present

## 2022-11-30 DIAGNOSIS — M1612 Unilateral primary osteoarthritis, left hip: Secondary | ICD-10-CM | POA: Diagnosis not present

## 2022-11-30 DIAGNOSIS — D631 Anemia in chronic kidney disease: Secondary | ICD-10-CM | POA: Diagnosis not present

## 2022-11-30 DIAGNOSIS — K3184 Gastroparesis: Secondary | ICD-10-CM | POA: Diagnosis not present

## 2022-11-30 DIAGNOSIS — M25562 Pain in left knee: Secondary | ICD-10-CM | POA: Diagnosis not present

## 2022-11-30 DIAGNOSIS — E1151 Type 2 diabetes mellitus with diabetic peripheral angiopathy without gangrene: Secondary | ICD-10-CM | POA: Diagnosis not present

## 2022-11-30 DIAGNOSIS — M1030 Gout due to renal impairment, unspecified site: Secondary | ICD-10-CM | POA: Diagnosis not present

## 2022-11-30 DIAGNOSIS — E1143 Type 2 diabetes mellitus with diabetic autonomic (poly)neuropathy: Secondary | ICD-10-CM | POA: Diagnosis not present

## 2022-11-30 DIAGNOSIS — I130 Hypertensive heart and chronic kidney disease with heart failure and stage 1 through stage 4 chronic kidney disease, or unspecified chronic kidney disease: Secondary | ICD-10-CM | POA: Diagnosis not present

## 2022-11-30 DIAGNOSIS — K5730 Diverticulosis of large intestine without perforation or abscess without bleeding: Secondary | ICD-10-CM | POA: Diagnosis not present

## 2022-11-30 DIAGNOSIS — M48061 Spinal stenosis, lumbar region without neurogenic claudication: Secondary | ICD-10-CM | POA: Diagnosis not present

## 2022-11-30 DIAGNOSIS — E1122 Type 2 diabetes mellitus with diabetic chronic kidney disease: Secondary | ICD-10-CM | POA: Diagnosis not present

## 2022-11-30 DIAGNOSIS — M8580 Other specified disorders of bone density and structure, unspecified site: Secondary | ICD-10-CM | POA: Diagnosis not present

## 2022-11-30 DIAGNOSIS — N183 Chronic kidney disease, stage 3 unspecified: Secondary | ICD-10-CM | POA: Diagnosis not present

## 2022-11-30 DIAGNOSIS — K219 Gastro-esophageal reflux disease without esophagitis: Secondary | ICD-10-CM | POA: Diagnosis not present

## 2022-11-30 DIAGNOSIS — E785 Hyperlipidemia, unspecified: Secondary | ICD-10-CM | POA: Diagnosis not present

## 2022-11-30 DIAGNOSIS — I2510 Atherosclerotic heart disease of native coronary artery without angina pectoris: Secondary | ICD-10-CM | POA: Diagnosis not present

## 2022-11-30 DIAGNOSIS — E11620 Type 2 diabetes mellitus with diabetic dermatitis: Secondary | ICD-10-CM | POA: Diagnosis not present

## 2022-11-30 DIAGNOSIS — K2270 Barrett's esophagus without dysplasia: Secondary | ICD-10-CM | POA: Diagnosis not present

## 2022-11-30 DIAGNOSIS — M5136 Other intervertebral disc degeneration, lumbar region: Secondary | ICD-10-CM | POA: Diagnosis not present

## 2022-11-30 DIAGNOSIS — I495 Sick sinus syndrome: Secondary | ICD-10-CM | POA: Diagnosis not present

## 2022-12-06 DIAGNOSIS — N183 Chronic kidney disease, stage 3 unspecified: Secondary | ICD-10-CM | POA: Diagnosis not present

## 2022-12-06 DIAGNOSIS — E1151 Type 2 diabetes mellitus with diabetic peripheral angiopathy without gangrene: Secondary | ICD-10-CM | POA: Diagnosis not present

## 2022-12-06 DIAGNOSIS — D631 Anemia in chronic kidney disease: Secondary | ICD-10-CM | POA: Diagnosis not present

## 2022-12-06 DIAGNOSIS — K2270 Barrett's esophagus without dysplasia: Secondary | ICD-10-CM | POA: Diagnosis not present

## 2022-12-06 DIAGNOSIS — M48061 Spinal stenosis, lumbar region without neurogenic claudication: Secondary | ICD-10-CM | POA: Diagnosis not present

## 2022-12-06 DIAGNOSIS — E1143 Type 2 diabetes mellitus with diabetic autonomic (poly)neuropathy: Secondary | ICD-10-CM | POA: Diagnosis not present

## 2022-12-06 DIAGNOSIS — M5136 Other intervertebral disc degeneration, lumbar region: Secondary | ICD-10-CM | POA: Diagnosis not present

## 2022-12-06 DIAGNOSIS — M8580 Other specified disorders of bone density and structure, unspecified site: Secondary | ICD-10-CM | POA: Diagnosis not present

## 2022-12-06 DIAGNOSIS — E785 Hyperlipidemia, unspecified: Secondary | ICD-10-CM | POA: Diagnosis not present

## 2022-12-06 DIAGNOSIS — I2510 Atherosclerotic heart disease of native coronary artery without angina pectoris: Secondary | ICD-10-CM | POA: Diagnosis not present

## 2022-12-06 DIAGNOSIS — K3184 Gastroparesis: Secondary | ICD-10-CM | POA: Diagnosis not present

## 2022-12-06 DIAGNOSIS — E11620 Type 2 diabetes mellitus with diabetic dermatitis: Secondary | ICD-10-CM | POA: Diagnosis not present

## 2022-12-06 DIAGNOSIS — I130 Hypertensive heart and chronic kidney disease with heart failure and stage 1 through stage 4 chronic kidney disease, or unspecified chronic kidney disease: Secondary | ICD-10-CM | POA: Diagnosis not present

## 2022-12-06 DIAGNOSIS — E1122 Type 2 diabetes mellitus with diabetic chronic kidney disease: Secondary | ICD-10-CM | POA: Diagnosis not present

## 2022-12-06 DIAGNOSIS — M1030 Gout due to renal impairment, unspecified site: Secondary | ICD-10-CM | POA: Diagnosis not present

## 2022-12-06 DIAGNOSIS — I5032 Chronic diastolic (congestive) heart failure: Secondary | ICD-10-CM | POA: Diagnosis not present

## 2022-12-06 DIAGNOSIS — M25562 Pain in left knee: Secondary | ICD-10-CM | POA: Diagnosis not present

## 2022-12-06 DIAGNOSIS — K5730 Diverticulosis of large intestine without perforation or abscess without bleeding: Secondary | ICD-10-CM | POA: Diagnosis not present

## 2022-12-06 DIAGNOSIS — E039 Hypothyroidism, unspecified: Secondary | ICD-10-CM | POA: Diagnosis not present

## 2022-12-06 DIAGNOSIS — I495 Sick sinus syndrome: Secondary | ICD-10-CM | POA: Diagnosis not present

## 2022-12-06 DIAGNOSIS — K219 Gastro-esophageal reflux disease without esophagitis: Secondary | ICD-10-CM | POA: Diagnosis not present

## 2022-12-06 DIAGNOSIS — M1612 Unilateral primary osteoarthritis, left hip: Secondary | ICD-10-CM | POA: Diagnosis not present

## 2022-12-07 ENCOUNTER — Telehealth: Payer: Self-pay | Admitting: Family Medicine

## 2022-12-07 NOTE — Telephone Encounter (Signed)
[name] ([facility]) requesting an order for skilled nursing eval. [phone] secure vm.

## 2022-12-07 NOTE — Telephone Encounter (Signed)
LMOM for Joni Reining w/ verbal orders.

## 2022-12-08 DIAGNOSIS — K5730 Diverticulosis of large intestine without perforation or abscess without bleeding: Secondary | ICD-10-CM | POA: Diagnosis not present

## 2022-12-08 DIAGNOSIS — E11620 Type 2 diabetes mellitus with diabetic dermatitis: Secondary | ICD-10-CM | POA: Diagnosis not present

## 2022-12-08 DIAGNOSIS — D631 Anemia in chronic kidney disease: Secondary | ICD-10-CM | POA: Diagnosis not present

## 2022-12-08 DIAGNOSIS — K2270 Barrett's esophagus without dysplasia: Secondary | ICD-10-CM | POA: Diagnosis not present

## 2022-12-08 DIAGNOSIS — M5136 Other intervertebral disc degeneration, lumbar region: Secondary | ICD-10-CM | POA: Diagnosis not present

## 2022-12-08 DIAGNOSIS — E1143 Type 2 diabetes mellitus with diabetic autonomic (poly)neuropathy: Secondary | ICD-10-CM | POA: Diagnosis not present

## 2022-12-08 DIAGNOSIS — E1151 Type 2 diabetes mellitus with diabetic peripheral angiopathy without gangrene: Secondary | ICD-10-CM | POA: Diagnosis not present

## 2022-12-08 DIAGNOSIS — M1030 Gout due to renal impairment, unspecified site: Secondary | ICD-10-CM | POA: Diagnosis not present

## 2022-12-08 DIAGNOSIS — I130 Hypertensive heart and chronic kidney disease with heart failure and stage 1 through stage 4 chronic kidney disease, or unspecified chronic kidney disease: Secondary | ICD-10-CM | POA: Diagnosis not present

## 2022-12-08 DIAGNOSIS — M1612 Unilateral primary osteoarthritis, left hip: Secondary | ICD-10-CM | POA: Diagnosis not present

## 2022-12-08 DIAGNOSIS — E039 Hypothyroidism, unspecified: Secondary | ICD-10-CM | POA: Diagnosis not present

## 2022-12-08 DIAGNOSIS — K3184 Gastroparesis: Secondary | ICD-10-CM | POA: Diagnosis not present

## 2022-12-08 DIAGNOSIS — E785 Hyperlipidemia, unspecified: Secondary | ICD-10-CM | POA: Diagnosis not present

## 2022-12-08 DIAGNOSIS — I5032 Chronic diastolic (congestive) heart failure: Secondary | ICD-10-CM | POA: Diagnosis not present

## 2022-12-08 DIAGNOSIS — M8580 Other specified disorders of bone density and structure, unspecified site: Secondary | ICD-10-CM | POA: Diagnosis not present

## 2022-12-08 DIAGNOSIS — I495 Sick sinus syndrome: Secondary | ICD-10-CM | POA: Diagnosis not present

## 2022-12-08 DIAGNOSIS — N183 Chronic kidney disease, stage 3 unspecified: Secondary | ICD-10-CM | POA: Diagnosis not present

## 2022-12-08 DIAGNOSIS — K219 Gastro-esophageal reflux disease without esophagitis: Secondary | ICD-10-CM | POA: Diagnosis not present

## 2022-12-08 DIAGNOSIS — M48061 Spinal stenosis, lumbar region without neurogenic claudication: Secondary | ICD-10-CM | POA: Diagnosis not present

## 2022-12-08 DIAGNOSIS — M25562 Pain in left knee: Secondary | ICD-10-CM | POA: Diagnosis not present

## 2022-12-08 DIAGNOSIS — I2510 Atherosclerotic heart disease of native coronary artery without angina pectoris: Secondary | ICD-10-CM | POA: Diagnosis not present

## 2022-12-08 DIAGNOSIS — E1122 Type 2 diabetes mellitus with diabetic chronic kidney disease: Secondary | ICD-10-CM | POA: Diagnosis not present

## 2022-12-13 ENCOUNTER — Telehealth: Payer: Self-pay | Admitting: Family Medicine

## 2022-12-13 DIAGNOSIS — D631 Anemia in chronic kidney disease: Secondary | ICD-10-CM | POA: Diagnosis not present

## 2022-12-13 DIAGNOSIS — M25562 Pain in left knee: Secondary | ICD-10-CM | POA: Diagnosis not present

## 2022-12-13 DIAGNOSIS — E11620 Type 2 diabetes mellitus with diabetic dermatitis: Secondary | ICD-10-CM | POA: Diagnosis not present

## 2022-12-13 DIAGNOSIS — M5136 Other intervertebral disc degeneration, lumbar region: Secondary | ICD-10-CM | POA: Diagnosis not present

## 2022-12-13 DIAGNOSIS — I5032 Chronic diastolic (congestive) heart failure: Secondary | ICD-10-CM | POA: Diagnosis not present

## 2022-12-13 DIAGNOSIS — K3184 Gastroparesis: Secondary | ICD-10-CM | POA: Diagnosis not present

## 2022-12-13 DIAGNOSIS — E1143 Type 2 diabetes mellitus with diabetic autonomic (poly)neuropathy: Secondary | ICD-10-CM | POA: Diagnosis not present

## 2022-12-13 DIAGNOSIS — E1122 Type 2 diabetes mellitus with diabetic chronic kidney disease: Secondary | ICD-10-CM | POA: Diagnosis not present

## 2022-12-13 DIAGNOSIS — E1151 Type 2 diabetes mellitus with diabetic peripheral angiopathy without gangrene: Secondary | ICD-10-CM | POA: Diagnosis not present

## 2022-12-13 DIAGNOSIS — K2270 Barrett's esophagus without dysplasia: Secondary | ICD-10-CM | POA: Diagnosis not present

## 2022-12-13 DIAGNOSIS — K5730 Diverticulosis of large intestine without perforation or abscess without bleeding: Secondary | ICD-10-CM | POA: Diagnosis not present

## 2022-12-13 DIAGNOSIS — M1612 Unilateral primary osteoarthritis, left hip: Secondary | ICD-10-CM | POA: Diagnosis not present

## 2022-12-13 DIAGNOSIS — M8580 Other specified disorders of bone density and structure, unspecified site: Secondary | ICD-10-CM | POA: Diagnosis not present

## 2022-12-13 DIAGNOSIS — I495 Sick sinus syndrome: Secondary | ICD-10-CM | POA: Diagnosis not present

## 2022-12-13 DIAGNOSIS — M1030 Gout due to renal impairment, unspecified site: Secondary | ICD-10-CM | POA: Diagnosis not present

## 2022-12-13 DIAGNOSIS — I130 Hypertensive heart and chronic kidney disease with heart failure and stage 1 through stage 4 chronic kidney disease, or unspecified chronic kidney disease: Secondary | ICD-10-CM | POA: Diagnosis not present

## 2022-12-13 DIAGNOSIS — K219 Gastro-esophageal reflux disease without esophagitis: Secondary | ICD-10-CM | POA: Diagnosis not present

## 2022-12-13 DIAGNOSIS — E039 Hypothyroidism, unspecified: Secondary | ICD-10-CM | POA: Diagnosis not present

## 2022-12-13 DIAGNOSIS — I2510 Atherosclerotic heart disease of native coronary artery without angina pectoris: Secondary | ICD-10-CM | POA: Diagnosis not present

## 2022-12-13 DIAGNOSIS — E785 Hyperlipidemia, unspecified: Secondary | ICD-10-CM | POA: Diagnosis not present

## 2022-12-13 DIAGNOSIS — N183 Chronic kidney disease, stage 3 unspecified: Secondary | ICD-10-CM | POA: Diagnosis not present

## 2022-12-13 DIAGNOSIS — M48061 Spinal stenosis, lumbar region without neurogenic claudication: Secondary | ICD-10-CM | POA: Diagnosis not present

## 2022-12-13 NOTE — Telephone Encounter (Signed)
Caller/Agency: Elenor Quinones [facility]) Callback Number: [phone] Requesting OT/PT/Skilled Nursing/Social Work/Speech Therapy: PT Frequency: 1 w 8  Leia also reported the following:  Pt is having a new onset of Right Elbow Pain rated 9/10. Pt has taken tylenol to attempt to treat it with no real improvement

## 2022-12-14 NOTE — Telephone Encounter (Signed)
Returned call. LVM with verbals and advised pt would need an appointment

## 2022-12-20 DIAGNOSIS — I495 Sick sinus syndrome: Secondary | ICD-10-CM | POA: Diagnosis not present

## 2022-12-20 DIAGNOSIS — M8580 Other specified disorders of bone density and structure, unspecified site: Secondary | ICD-10-CM | POA: Diagnosis not present

## 2022-12-20 DIAGNOSIS — M1612 Unilateral primary osteoarthritis, left hip: Secondary | ICD-10-CM | POA: Diagnosis not present

## 2022-12-20 DIAGNOSIS — K3184 Gastroparesis: Secondary | ICD-10-CM | POA: Diagnosis not present

## 2022-12-20 DIAGNOSIS — I130 Hypertensive heart and chronic kidney disease with heart failure and stage 1 through stage 4 chronic kidney disease, or unspecified chronic kidney disease: Secondary | ICD-10-CM | POA: Diagnosis not present

## 2022-12-20 DIAGNOSIS — K5730 Diverticulosis of large intestine without perforation or abscess without bleeding: Secondary | ICD-10-CM | POA: Diagnosis not present

## 2022-12-20 DIAGNOSIS — M1030 Gout due to renal impairment, unspecified site: Secondary | ICD-10-CM | POA: Diagnosis not present

## 2022-12-20 DIAGNOSIS — N183 Chronic kidney disease, stage 3 unspecified: Secondary | ICD-10-CM | POA: Diagnosis not present

## 2022-12-20 DIAGNOSIS — I5032 Chronic diastolic (congestive) heart failure: Secondary | ICD-10-CM | POA: Diagnosis not present

## 2022-12-20 DIAGNOSIS — M25562 Pain in left knee: Secondary | ICD-10-CM | POA: Diagnosis not present

## 2022-12-20 DIAGNOSIS — I252 Old myocardial infarction: Secondary | ICD-10-CM | POA: Diagnosis not present

## 2022-12-20 DIAGNOSIS — K2270 Barrett's esophagus without dysplasia: Secondary | ICD-10-CM | POA: Diagnosis not present

## 2022-12-20 DIAGNOSIS — M778 Other enthesopathies, not elsewhere classified: Secondary | ICD-10-CM | POA: Diagnosis not present

## 2022-12-20 DIAGNOSIS — J309 Allergic rhinitis, unspecified: Secondary | ICD-10-CM | POA: Diagnosis not present

## 2022-12-20 DIAGNOSIS — E11620 Type 2 diabetes mellitus with diabetic dermatitis: Secondary | ICD-10-CM | POA: Diagnosis not present

## 2022-12-20 DIAGNOSIS — M5136 Other intervertebral disc degeneration, lumbar region: Secondary | ICD-10-CM | POA: Diagnosis not present

## 2022-12-20 DIAGNOSIS — D631 Anemia in chronic kidney disease: Secondary | ICD-10-CM | POA: Diagnosis not present

## 2022-12-20 DIAGNOSIS — E1122 Type 2 diabetes mellitus with diabetic chronic kidney disease: Secondary | ICD-10-CM | POA: Diagnosis not present

## 2022-12-20 DIAGNOSIS — E1143 Type 2 diabetes mellitus with diabetic autonomic (poly)neuropathy: Secondary | ICD-10-CM | POA: Diagnosis not present

## 2022-12-20 DIAGNOSIS — E1151 Type 2 diabetes mellitus with diabetic peripheral angiopathy without gangrene: Secondary | ICD-10-CM | POA: Diagnosis not present

## 2022-12-20 DIAGNOSIS — M5030 Other cervical disc degeneration, unspecified cervical region: Secondary | ICD-10-CM | POA: Diagnosis not present

## 2022-12-20 DIAGNOSIS — M48061 Spinal stenosis, lumbar region without neurogenic claudication: Secondary | ICD-10-CM | POA: Diagnosis not present

## 2022-12-20 DIAGNOSIS — I2510 Atherosclerotic heart disease of native coronary artery without angina pectoris: Secondary | ICD-10-CM | POA: Diagnosis not present

## 2022-12-22 ENCOUNTER — Other Ambulatory Visit: Payer: Self-pay | Admitting: Cardiovascular Disease

## 2022-12-22 DIAGNOSIS — E785 Hyperlipidemia, unspecified: Secondary | ICD-10-CM

## 2022-12-23 NOTE — Telephone Encounter (Signed)
error 

## 2022-12-30 ENCOUNTER — Ambulatory Visit: Payer: Self-pay

## 2022-12-30 NOTE — Patient Instructions (Signed)
Visit Information  Thank you for taking time to visit with me today. Please don't hesitate to contact me if I can be of assistance to you.   Following are the goals we discussed today:  Continue to take medications as prescribed. Continue to attend provider visits as scheduled. Your next appointment at your Primary Care Provider office is scheduled for 01/02/23 at 1:00 pm Continue to contact provider for health questions or concerns   Our next appointment is by telephone on 01/16/23 at 1:00 pm  Please call the care guide team at [phone] if you need to cancel or reschedule your appointment.   If you are experiencing a Mental Health or Behavioral Health Crisis or need someone to talk to, please call the Suicide and Crisis Lifeline: 988 call the [place] National Suicide Prevention Lifeline: 828-139-9641 or TTY: 959-536-9579 TTY 548-641-6477) to talk to a trained counselor call 1-800-273-TALK (toll free, 24 hour hotline)  Kathyrn Sheriff, RN, MSN, BSN, CCM Care Management Coordinator [phone]

## 2022-12-30 NOTE — Patient Outreach (Signed)
Care Coordination   Follow Up Visit Note   12/30/2022 Name: [name] MRN: [id number] DOB: 22-Jul-1932  [name] Life is a 89 y.o. year old female who sees [name], Samantha Congress, DO for primary care. I spoke with  [name] by phone today.  What matters to the patients health and wellness today?  [name] reports diarrhea has started back again. She also reports right elbow pain. Patient states she has all her medications and is taking as prescribed. RNCM called to schedule appointment. [name] states she has transportation to appointment.  Goals Addressed             This Visit's Progress    Assist with health management       Interventions Today    Flowsheet Row Most Recent Value  General Interventions   General Interventions Discussed/Reviewed General Interventions Reviewed, Doctor Visits, Communication with  Doctor Visits Discussed/Reviewed Doctor Visits Discussed, Doctor Visits Reviewed, PCP  PCP/Specialist Visits Compliance with follow-up visit  Communication with PCP/Specialists  [schedule appointment re: diarrhea and right arm pain for 01/02/23. RNCM confirmed with patient that she has transportation.]  Exercise Interventions   Exercise Discussed/Reviewed Exercise Reviewed  [reports has not been discharged from physical therapy, and is awaiting call from therapist for a visit]  Education Interventions   Education Provided Provided Education  Provided Verbal Education On Exercise, Medication, When to see the doctor, Other  [advised to continue to take medications as prescribed,  attend provider visits as scheduled,  Continue to eat healthy, lean meats, vegetables, fruits, avoid saturated and transfats.]  Nutrition Interventions   Nutrition Discussed/Reviewed Nutrition Reviewed  Pharmacy Interventions   Pharmacy Dicussed/Reviewed Pharmacy Topics Reviewed  Safety Interventions   Safety Discussed/Reviewed Safety Reviewed, Fall Risk  [reiterated importance of  fall prevention]            SDOH assessments and interventions completed:  No  Care Coordination Interventions:  Yes, provided   Follow up plan: Follow up call scheduled for 01/16/23    Encounter Outcome:  Patient Visit Completed   Samantha Sheriff, RN, MSN, BSN, CCM Care Management Coordinator [phone]

## 2023-01-02 ENCOUNTER — Encounter: Payer: Self-pay | Admitting: Physician Assistant

## 2023-01-02 ENCOUNTER — Ambulatory Visit (INDEPENDENT_AMBULATORY_CARE_PROVIDER_SITE_OTHER): Payer: Medicare Other | Admitting: Physician Assistant

## 2023-01-02 VITALS — BP 136/72 | HR 68 | Temp 98.7°F | Ht 65.0 in | Wt 188.1 lb

## 2023-01-02 DIAGNOSIS — R197 Diarrhea, unspecified: Secondary | ICD-10-CM

## 2023-01-02 DIAGNOSIS — M25521 Pain in right elbow: Secondary | ICD-10-CM | POA: Diagnosis not present

## 2023-01-02 MED ORDER — DICYCLOMINE HCL 10 MG PO CAPS
10.0000 mg | ORAL_CAPSULE | Freq: Three times a day (TID) | ORAL | 0 refills | Status: DC
Start: 2023-01-02 — End: 2023-08-28

## 2023-01-02 NOTE — Progress Notes (Signed)
Established patient visit   Patient: [name]   DOB: 07-31-1932   89 y.o. Female  MRN: [id number] Visit Date: 01/02/2023  Today's healthcare provider: Alfredia Ferguson, PA-C   Cc. Right elbow pain, diarrhea  Subjective    HPI   Pt reports right elbow pain for a few weeks without swelling, injury. Using tylenol/heat with no improvement.   Pt reports diarrhea has returned for the last month, several times a day, she feels near syncope during BM. History of Cdiff earlier this year.  Medications: Outpatient Medications Prior to Visit  Medication Sig   ACCU-CHEK GUIDE test strip CHECK GLUCOSE IN THE MORNING AT  NOON AND AT BEDTIME   Accu-Chek Softclix Lancets lancets CHECK GLUCOSE IN THE MORNING AT  NOON AND AT BEDTIME   acetaminophen (TYLENOL) 500 MG tablet Take 500 mg by mouth every 6 (six) hours as needed for headache (pain).   allopurinol (ZYLOPRIM) 100 MG tablet Take 100 mg by mouth at bedtime.    amLODipine (NORVASC) 5 MG tablet Take 1 tablet (5 mg total) by mouth 2 (two) times daily.   atorvastatin (LIPITOR) 20 MG tablet TAKE 1 TABLET BY MOUTH ONCE  DAILY   azelastine (OPTIVAR) 0.05 % ophthalmic solution Place 1 drop into both eyes 2 (two) times daily.   Blood Glucose Monitoring Suppl DEVI 1 each by Does not apply route in the morning, at noon, and at bedtime. May substitute to any manufacturer covered by patient's insurance.   carvedilol (COREG) 12.5 MG tablet TAKE 1 TABLET BY MOUTH TWICE  DAILY   clopidogrel (PLAVIX) 75 MG tablet TAKE 1 TABLET BY MOUTH  DAILY (Patient taking differently: Take 75 mg by mouth daily. TAKE 1 TABLET BY MOUTH  DAILY)   furosemide (LASIX) 40 MG tablet Take 1 tablet (40 mg total) by mouth daily.   Insulin Pen Needle (BD PEN NEEDLE MICRO U/F) 32G X 6 MM MISC To inject Humalin   Multiple Vitamin (MULTIVITAMIN WITH MINERALS) TABS tablet Take 1 tablet by mouth at bedtime.    nitroGLYCERIN (NITROSTAT) 0.4 MG SL tablet DISSOLVE ONE TABLET UNDER  THE TONGUE EVERY 5 MINUTES AS NEEDED FOR CHEST PAIN.  DO NOT EXCEED A TOTAL OF 3 DOSES IN 15 MINUTES   ONE TOUCH ULTRA TEST test strip 1 each by Other route daily as needed (blood sugar).    pantoprazole (PROTONIX) 40 MG tablet Take 1 tablet (40 mg total) by mouth daily.   potassium chloride (KLOR-CON) 10 MEQ tablet Take 1 tablet (10 mEq total) by mouth daily. (Patient taking differently: Take 10 mEq by mouth every Monday, Wednesday, and Friday.)   sacubitril-valsartan (ENTRESTO) 97-103 MG TAKE 1 TABLET BY MOUTH TWICE  DAILY   tobramycin (TOBREX) 0.3 % ophthalmic solution Place 1 drop into the left eye 4 (four) times daily.   Facility-Administered Medications Prior to Visit  Medication Dose Route Frequency Provider   betamethasone acetate-betamethasone sodium phosphate (CELESTONE) injection 3 mg  3 mg Intramuscular Once Felecia Shelling, DPM    Review of Systems  Constitutional:  Negative for fatigue and fever.  Respiratory:  Negative for cough and shortness of breath.   Cardiovascular:  Negative for chest pain and leg swelling.  Gastrointestinal:  Positive for diarrhea. Negative for abdominal pain.  Musculoskeletal:  Positive for arthralgias.  Neurological:  Negative for dizziness and headaches.      Objective    BP 136/72   Pulse 68   Temp 98.7 F (37.1 C) (Temporal)  Ht 5\' 5"  (1.651 m)   Wt 188 lb 2 oz (85.3 kg)   SpO2 97%   BMI 31.31 kg/m   Physical Exam Constitutional:      General: She is awake.     Appearance: She is well-developed.  HENT:     Head: Normocephalic.  Eyes:     Conjunctiva/sclera: Conjunctivae normal.  Cardiovascular:     Rate and Rhythm: Normal rate and regular rhythm.     Heart sounds: Normal heart sounds.  Pulmonary:     Effort: Pulmonary effort is normal.     Breath sounds: Normal breath sounds.  Abdominal:     Tenderness: There is no abdominal tenderness.  Musculoskeletal:     Comments: Right elbow with no tenderness, effusion, erythema   Skin:    General: Skin is warm.  Neurological:     Mental Status: She is alert and oriented to person, place, and time.  Psychiatric:        Attention and Perception: Attention normal.        Mood and Affect: Mood normal.        Speech: Speech normal.        Behavior: Behavior is cooperative.      No results found for any visits on 01/02/23.  Assessment & Plan     1. Diarrhea, unspecified type Given gx, repeat stool cultures r/o cdif Rx bentyl to help symptoms  - Cdiff NAA+O+P+Stool Culture - dicyclomine (BENTYL) 10 MG capsule; Take 1 capsule (10 mg total) by mouth 4 (four) times daily -  before meals and at bedtime.  Dispense: 60 capsule; Refill: 0  2. Right elbow pain Exam benign  Cont tylenol, recommend icing  Return if symptoms worsen or fail to improve.      I, Alfredia Ferguson, PA-C have reviewed all documentation for this visit. The documentation on  01/02/23   for the exam, diagnosis, procedures, and orders are all accurate and complete.    Alfredia Ferguson, PA-C  [facility] [phone] (phone) [phone] (fax)  [facility]

## 2023-01-03 ENCOUNTER — Other Ambulatory Visit: Payer: Medicare Other

## 2023-01-03 DIAGNOSIS — R197 Diarrhea, unspecified: Secondary | ICD-10-CM | POA: Diagnosis not present

## 2023-01-03 NOTE — Progress Notes (Signed)
Specimen drop off

## 2023-01-07 LAB — CDIFF NAA+O+P+STOOL CULTURE
E coli, Shiga toxin Assay: NEGATIVE
Toxigenic C. Difficile by PCR: NEGATIVE

## 2023-01-16 ENCOUNTER — Ambulatory Visit: Payer: Self-pay

## 2023-01-16 NOTE — Patient Instructions (Addendum)
Visit Information  Thank you for taking time to visit with me today. Please don't hesitate to contact me if I can be of assistance to you.   Following are the goals we discussed today:  Contact provider to notify of health status re: diarrhea. Schedule follow up appointment. Continue to take medications as prescribed. Continue to attend provider visits as scheduled Continue to eat healthy, lean meats, vegetables, fruits, avoid saturated and transfats Continue to check blood sugar as recommended and notify provider if questions or concerns Contact provider with any other health concerns or issues as needed   Our next appointment is by telephone on 02/07/23 at 1:00 pm  Please call the care guide team at [phone] if you need to cancel or reschedule your appointment.   If you are experiencing a Mental Health or Behavioral Health Crisis or need someone to talk to, please call the Suicide and Crisis Lifeline: 988 call the [place] National Suicide Prevention Lifeline: 531-564-0015 or TTY: (660)352-0285 TTY 830 369 5079) to talk to a trained counselor call 1-800-273-TALK (toll free, 24 hour hotline)   Kathyrn Sheriff, RN, MSN, BSN, CCM Care Management Coordinator [phone]

## 2023-01-16 NOTE — Patient Outreach (Signed)
Care Coordination   Follow Up Visit Note   01/16/2023 Name: VERNETHA QUERRY MRN: [id number] DOB: [birth year]  [name] Gorsline is a 89 y.o. year old female who sees [name], Grayling Congress, DO for primary care. I spoke with  [name] by phone today.  What matters to the patients health and wellness today?  Ms. Batch reports follow up completed with primary provider office on 01/03/23. Discyclomin prescribed. She reports she has taken as prescribed. She reports she does not feel as bad, but states continues to have 3-4 watery stools/day. Patient reports diarrhea from irritable bowel syndrome. RNCM advised patient to contact provider for follow up. Patient reports she will call to schedule.  Goals Addressed             This Visit's Progress    Assist with health management post rehab stay       Interventions Today    Flowsheet Row Most Recent Value  Chronic Disease   Chronic disease during today's visit Other, Diabetes  [per patient IBS]  General Interventions   General Interventions Discussed/Reviewed General Interventions Reviewed  [Evaluation of current treatment plan for health condition and patient's adherence to plan.]  Doctor Visits Discussed/Reviewed Doctor Visits Reviewed, PCP  PCP/Specialist Visits Compliance with follow-up visit  [reviewed upcoming appointments]  Education Interventions   Education Provided Provided Education  [reviewed patient instructions with primary provider from 9/23 office visit]  Provided Verbal Education On Medication, When to see the doctor, Nutrition  [discussed foods to avoid that can trigger IBS. encouraged patient to write down foods that she notes to help or worsen condtion]  Pharmacy Interventions   Pharmacy Dicussed/Reviewed Pharmacy Topics Reviewed  Advanced Directive Interventions   Advanced Directives Discussed/Reviewed Advanced Directives Discussed  [discussed AD. patient reporrts has advance directive paperwork completed, but needs to  get paperwork notirized.]            SDOH assessments and interventions completed:  No  Care Coordination Interventions:  Yes, provided   Follow up plan: Follow up call scheduled for 02/07/23    Encounter Outcome:  Patient Visit Completed   Kathyrn Sheriff, RN, MSN, BSN, CCM Care Management Coordinator [phone]

## 2023-01-27 ENCOUNTER — Telehealth: Payer: Self-pay | Admitting: Family Medicine

## 2023-01-27 NOTE — Telephone Encounter (Signed)
[name] from [facility] stopped in to give outstanding orders from 10/21/22, 11/16/22, and 12/13/22. [employer] representative also had medical updates on the pt which he would like to give to PCP's CMA. Please call rep to discuss medical updates.

## 2023-01-30 NOTE — Telephone Encounter (Signed)
Received. Placed in folder for sig

## 2023-01-31 ENCOUNTER — Ambulatory Visit (INDEPENDENT_AMBULATORY_CARE_PROVIDER_SITE_OTHER): Payer: Medicare Other

## 2023-01-31 DIAGNOSIS — I5032 Chronic diastolic (congestive) heart failure: Secondary | ICD-10-CM

## 2023-01-31 DIAGNOSIS — I495 Sick sinus syndrome: Secondary | ICD-10-CM

## 2023-02-01 LAB — CUP PACEART REMOTE DEVICE CHECK
Battery Impedance: 3124 Ohm
Battery Remaining Longevity: 18 mo
Battery Voltage: 2.72 V
Brady Statistic AP VP Percent: 94 %
Brady Statistic AP VS Percent: 0 %
Brady Statistic AS VP Percent: 6 %
Brady Statistic AS VS Percent: 0 %
Date Time Interrogation Session: 20241022154331
Implantable Lead Connection Status: 753985
Implantable Lead Connection Status: 753985
Implantable Lead Implant Date: 20131126
Implantable Lead Implant Date: 20131126
Implantable Lead Location: 753859
Implantable Lead Location: 753860
Implantable Lead Model: 5076
Implantable Lead Model: 5092
Implantable Pulse Generator Implant Date: 20131126
Lead Channel Impedance Value: 454 Ohm
Lead Channel Impedance Value: 464 Ohm
Lead Channel Pacing Threshold Amplitude: 0.625 V
Lead Channel Pacing Threshold Amplitude: 1.125 V
Lead Channel Pacing Threshold Pulse Width: 0.4 ms
Lead Channel Pacing Threshold Pulse Width: 0.4 ms
Lead Channel Setting Pacing Amplitude: 2 V
Lead Channel Setting Pacing Amplitude: 2.5 V
Lead Channel Setting Pacing Pulse Width: 0.4 ms
Lead Channel Setting Sensing Sensitivity: 2.8 mV
Zone Setting Status: 755011
Zone Setting Status: 755011

## 2023-02-07 ENCOUNTER — Ambulatory Visit: Payer: Self-pay

## 2023-02-07 NOTE — Patient Outreach (Signed)
Care Coordination   Follow Up Visit Note   02/07/2023 Name: [name] MRN: [id number] DOB: [birth year]  [name] is a 89 y.o. year old female who sees [name], Samantha Congress, DO for primary care. I spoke with  [name] by phone today.  What matters to the patients health and wellness today?  [name] reports Dicyclomine helped her diarrhea, however she states she needs a refill. She states the last dicyclomine was taken about 2 weeks ago. Also request resources in home aide assistance to help her with bath. Referral to care guide for community resources  Goals Addressed             This Visit's Progress    Assist with health management       Interventions Today    Flowsheet Row Most Recent Value  Chronic Disease   Chronic disease during today's visit Other, Diabetes  [(per patient IBS)]  General Interventions   General Interventions Discussed/Reviewed General Interventions Reviewed, [facility], Communication with  [Evaluation of current treatment plan for health condition and patient's adherence to plan. referral to care guide regarding in home aid assistance]  Communication with PCP/Specialists  [patient request refill on Dicyclomine]  Education Interventions   Education Provided Provided Education  Provided Verbal Education On When to see the doctor, Medication, Nutrition  [advised to attend provider visits as scheduled, take medication as prescribed, contact provider with health questions or concerns as needed.]  Pharmacy Interventions   Pharmacy Dicussed/Reviewed Pharmacy Topics Reviewed  [reports needs a reill on dicyclomine]  Safety Interventions   Safety Discussed/Reviewed Safety Reviewed, Fall Risk  [fall prevention strategies discussed]          SDOH assessments and interventions completed:  No  Care Coordination Interventions:  Yes, provided   Follow up plan: Follow up call scheduled for 03/13/23    Encounter Outcome:  Patient Visit  Completed   Kathyrn Sheriff, RN, MSN, BSN, CCM Care Management Coordinator [phone]

## 2023-02-07 NOTE — Patient Instructions (Signed)
Visit Information  Thank you for taking time to visit with me today. Please don't hesitate to contact me if I can be of assistance to you.   Following are the goals we discussed today:  Continue to take medications as prescribed. Continue to attend provider visits as scheduled Continue to eat healthy, lean meats, vegetables, fruits, avoid saturated and transfats Contact provider with health questions or concern.   Our next appointment is by telephone on 03/13/23 at 1:30 pm  Please call the care guide team at [phone] if you need to cancel or reschedule your appointment.   If you are experiencing a Mental Health or Behavioral Health Crisis or need someone to talk to, please call the Suicide and Crisis Lifeline: 988 call the [place] National Suicide Prevention Lifeline: (702)444-5060 or TTY: (580)754-1220 TTY 984-549-5399) to talk to a trained counselor call 1-800-273-TALK (toll free, 24 hour hotline)   Kathyrn Sheriff, RN, MSN, BSN, CCM Care Management Coordinator [phone]

## 2023-02-10 ENCOUNTER — Other Ambulatory Visit: Payer: Self-pay | Admitting: Family Medicine

## 2023-02-10 DIAGNOSIS — R197 Diarrhea, unspecified: Secondary | ICD-10-CM

## 2023-02-10 MED ORDER — DICYCLOMINE HCL 10 MG PO CAPS
10.0000 mg | ORAL_CAPSULE | Freq: Three times a day (TID) | ORAL | 0 refills | Status: DC
Start: 2023-02-10 — End: 2023-08-28

## 2023-02-17 NOTE — Progress Notes (Signed)
Remote pacemaker transmission.   

## 2023-03-03 ENCOUNTER — Ambulatory Visit (INDEPENDENT_AMBULATORY_CARE_PROVIDER_SITE_OTHER): Payer: Medicare Other | Admitting: General Practice

## 2023-03-03 VITALS — BP 150/60 | HR 59 | Temp 97.3°F | Resp 16 | Ht 66.0 in | Wt 214.9 lb

## 2023-03-03 DIAGNOSIS — L308 Other specified dermatitis: Secondary | ICD-10-CM | POA: Diagnosis not present

## 2023-03-03 DIAGNOSIS — T148XXA Other injury of unspecified body region, initial encounter: Secondary | ICD-10-CM | POA: Diagnosis not present

## 2023-03-03 MED ORDER — CEPHALEXIN 500 MG PO CAPS: 500.0000 mg | ORAL_CAPSULE | Freq: Two times a day (BID) | ORAL | 0 refills | Status: AC

## 2023-03-03 MED ORDER — NYSTATIN 100000 UNIT/GM EX CREA
1.0000 | TOPICAL_CREAM | Freq: Two times a day (BID) | CUTANEOUS | 0 refills | Status: DC
Start: 2023-03-03 — End: 2023-08-29

## 2023-03-03 NOTE — Assessment & Plan Note (Addendum)
No alarming signs on exam.   Start Nystatin 100000 Unit/GM ex cream two times a day to help with itching.   She will update if symptoms worse or do not improve.

## 2023-03-03 NOTE — Progress Notes (Signed)
Established Patient Office Visit  Subjective   Patient ID: [name], female    DOB: June 01, 1932  Age: 89 y.o. MRN: [id number]   HPI  [name]. Sypher is 89 year old female, patient of Dr. 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   1) Open wound on scapula: Her daughter noticed it yesterday a spot on her left scapula.  Her daughter popped it and it did bleed for a little while. She does report itching with no pain or discharge. She has not tried anything OTC.   She also started itching in the abdominal folds yesterday and feels that it is coming from the moisture. She has had this before and has used a cream and powder but does not recall the name. No bleeding or pain.   She has a history of diabetes. Checks blood sugars at home and states that they have been good.    Patient Active Problem List   Diagnosis Date Noted   Dermatitis associated with moisture 03/03/2023   Open wound of skin 03/03/2023   Acute pain of left hip 09/01/2022   Need for pneumococcal 20-valent conjugate vaccination 08/22/2022   Rash 05/27/2022   Tremor 05/27/2022   Coronary artery disease involving native coronary artery of native heart without angina pectoris 09/14/2021   Pain due to onychomycosis of toenails of both feet 01/15/2021   Type 2 diabetes mellitus with diabetic dermatitis, without long-term current use of insulin (HCC) 01/15/2021   Palpitations 01/15/2018   Cardiac pacemaker in situ 01/15/2018   Heel spur, left 09/13/2017   Left knee pain 08/23/2017   CKD (chronic kidney disease), stage III (HCC) 12/04/2016   Chest pain 11/23/2015   Type I (juvenile type) diabetes mellitus with renal manifestations, not stated as uncontrolled(250.41) 04/27/2012   Insulin dependent diabetes mellitus with complications 04/27/2012   AKI (acute kidney injury) (HCC)  03/05/2012   Diarrhea 03/05/2012   Carotid artery disease (HCC) 08/11/2010   Left shoulder pain 07/08/2010   Preventative health care 07/08/2010   HOARSENESS 06/04/2010   Pruritus 05/11/2010   ANEMIA-NOS 04/02/2010   (HFpEF) heart failure with preserved ejection fraction (HCC) 02/11/2010   SINUS BRADYCARDIA 10/30/2009   Allergic rhinitis 09/11/2009   Backache 09/11/2009   MUSCLE STRAIN, RIGHT BUTTOCK 09/11/2009   Herpes zoster 05/11/2009   SHOULDER PAIN, LEFT 12/29/2008   Proteinuria 12/12/2008   Abdominal pain 09/24/2007   Hx of CABG 05/03/2007   CHEST PAIN 04/20/2007   Dizziness 02/28/2007   Anxiety state 02/15/2007   GERD 02/15/2007   Gastroparesis 02/15/2007   DISC DISEASE, CERVICAL 02/15/2007   DISC DISEASE, LUMBAR 02/15/2007   SPINAL STENOSIS, LUMBAR 02/15/2007   PERIPHERAL EDEMA 02/15/2007   Personal History of Other Diseases of Digestive Disease 02/15/2007   Hyperlipidemia LDL goal <70 01/01/2007   GOUT 01/01/2007   Morbid obesity (HCC) 01/01/2007   DEPRESSION 01/01/2007   PERIPHERAL VASCULAR DISEASE 01/01/2007   Diverticulosis of colon 01/01/2007   Osteoarthritis 01/01/2007   LOW BACK PAIN 01/01/2007   Disorder of bone and cartilage 01/01/2007   Essential hypertension 10/26/2006   Past Medical History:  Diagnosis Date   Allergic rhinitis    Anemia    Anxiety    Barrett esophagus    CAD (coronary artery disease) 2009   a. Multivessel s/p PCI w/DES 2009 // b. s/p CABG 2011  //  c.  LHC 8/15: pLAD 95 ISR, LCx 100, pOM1 40, dRCA 100, S-OM1/OM2 ok, S-D1 ok, S-PDA ok, L-LAD ok, EF 60%   Carotid artery disease (HCC)    a. Carotid US 9/15: RICA 1-39%; LICA 40-59% >> FU 1 year  //  b. Carotid US 9/17: R 1-39%, L 40-59% >> FU 1 year   Chronic diastolic heart failure (HCC)    CKD (chronic kidney disease), stage II    GFR 60-89 ml/min   Depression    Disc disease, degenerative, cervical    Diverticulosis    Gastroparesis    GERD (gastroesophageal reflux disease)     Gout    H/O hiatal hernia    Helicobacter pylori gastritis    History of echocardiogram    a. Echo 11/13: EF 55% to 60%. Grade 2 diastolic dysfunction, MAC, trivial MR, mild LAE, normal RVSF, mild RAE, PASP 39 mmHg  //  b. 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   Type II or unspecified type diabetes mellitus without mention of complication, not stated as uncontrolled    Allergies  Allergen Reactions   Ciprofloxacin Nausea And Vomiting and Other (See Comments)    Syncope, also    Codeine Other (See Comments)    HALLUCINATIONS   Hydrocodone Other (See Comments)    Made her pass out   Penicillins Hives   Shellfish Allergy Hives   Diltiazem Hcl Other (See Comments)    Low heart rate   Sulfonamide Derivatives Nausea And Vomiting   Morphine And Codeine Other (See Comments)    "Went crazy"   Lovastatin    Metformin Diarrhea         01/02/2023    1:05 PM 11/02/2022   10:46 AM 07/05/2022   10:35 AM  Depression screen PHQ 2/9  Decreased Interest 0 0 0  Down, Depressed, Hopeless 0 0 1  PHQ - 2 Score 0 0 1  Altered sleeping  0   Tired, decreased energy  0   Change in appetite  0   Feeling bad or failure about yourself   0   Trouble concentrating  0   Moving slowly or fidgety/restless  0   Suicidal thoughts  0   PHQ-9 Score  0   Difficult doing work/chores  Not difficult at all         No data to display            Review of Systems  Respiratory:  Negative for shortness of breath.   Cardiovascular:  Negative for chest pain.  Skin:   Negative for itching and rash.       Ulcer on back  Neurological:  Negative for dizziness and headaches.      Objective:     BP (!) 150/60 (BP Location: Left Arm, Patient Position: Sitting, Cuff Size: Large)   Pulse (!) 59   Temp (!) 97.3 F (36.3 C) (Oral)   Resp 16   Ht 5\' 6"  (1.676 m)  Wt 214 lb 14.4 oz (97.5 kg)   BMI 34.69 kg/m  BP Readings from Last 3 Encounters:  03/03/23 (!) 150/60  01/02/23 136/72  10/31/22 (!) 110/50   Wt Readings from Last 3 Encounters:  03/03/23 214 lb 14.4 oz (97.5 kg)  01/02/23 188 lb 2 oz (85.3 kg)  11/02/22 184 lb (83.5 kg)      Physical Exam Vitals and nursing note reviewed.  Constitutional:      Appearance: Normal appearance.  Cardiovascular:     Rate and Rhythm: Normal rate and regular rhythm.     Pulses: Normal pulses.     Heart sounds: Normal heart sounds.  Pulmonary:     Effort: Pulmonary effort is normal.     Breath sounds: Normal breath sounds.  Skin:    General: Skin is warm and dry.     Capillary Refill: Capillary refill takes less than 2 seconds.     Comments: Moisture associated dermatitis present bilaterally in abdominal folds with skin break down. Dime sized wound with no erythema, tenderness.   Neurological:     Mental Status: She is alert and oriented to person, place, and time.  Psychiatric:        Mood and Affect: Mood normal.        Behavior: Behavior normal.        Thought Content: Thought content normal.        Judgment: Judgment normal.      No results found for any visits on 03/03/23.     The ASCVD Risk score (Arnett DK, et al., 2019) failed to calculate for the following reasons:   The 2019 ASCVD risk score is only valid for ages 13 to 20    Assessment & Plan:  Dermatitis associated with moisture Assessment & Plan: No alarming signs on exam.   Start Nystatin 100000 Unit/GM ex cream two times a day to help with itching.   She will update if symptoms worse or do not improve.   Orders: -      Nystatin; Apply 1 Application topically 2 (two) times daily.  Dispense: 30 g; Refill: 0  Open wound of skin Assessment & Plan: Differentials include abscess, ulcer.   No erythema or tenderness present. No alarm signs on physical exam.   Due to history of diabetes with dermatitis, will treat with antibiotics.   Start cephalexin 500 mg BID for 7 days.   She will update if symptoms worsen or do not improve.     Orders: -     Cephalexin; Take 1 capsule (500 mg total) by mouth 2 (two) times daily for 7 days.  Dispense: 14 capsule; Refill: 0     No follow-ups on file.    Modesto Charon, NP

## 2023-03-03 NOTE — Patient Instructions (Addendum)
Start Keflex 500 mg two times a day. (Antibiotic)  Start the Nystatin cream two times daily for the moisture associated dermatitis.   Please follow up with PCP if symptoms worsen.   It was a pleasure to see you today!

## 2023-03-03 NOTE — Assessment & Plan Note (Addendum)
Differentials include abscess, ulcer.   No erythema or tenderness present. No alarm signs on physical exam.   Due to history of diabetes with dermatitis, will treat with antibiotics.   Start cephalexin 500 mg BID for 7 days.   She will update if symptoms worsen or do not improve.

## 2023-03-10 ENCOUNTER — Other Ambulatory Visit: Payer: Self-pay | Admitting: Family Medicine

## 2023-03-13 ENCOUNTER — Telehealth: Payer: Self-pay

## 2023-03-13 NOTE — Patient Outreach (Signed)
  Care Coordination   03/13/2023 Name: WANDALENE SUFFERN MRN: [id number] DOB: 1932-08-02   Care Coordination Outreach Attempts:  An unsuccessful telephone outreach was attempted for a scheduled appointment today.  Follow Up Plan:  Additional outreach attempts will be made to offer the patient care coordination information and services.   Encounter Outcome:  No Answer   Care Coordination Interventions:  No, not indicated    Kathyrn Sheriff, RN, MSN, BSN, CCM Care Management Coordinator [phone]

## 2023-03-25 ENCOUNTER — Other Ambulatory Visit: Payer: Self-pay | Admitting: Cardiovascular Disease

## 2023-03-27 ENCOUNTER — Encounter: Payer: Self-pay | Admitting: Family Medicine

## 2023-03-27 ENCOUNTER — Ambulatory Visit (INDEPENDENT_AMBULATORY_CARE_PROVIDER_SITE_OTHER): Payer: Medicare Other | Admitting: Family Medicine

## 2023-03-27 ENCOUNTER — Other Ambulatory Visit: Payer: Self-pay

## 2023-03-27 ENCOUNTER — Ambulatory Visit (HOSPITAL_BASED_OUTPATIENT_CLINIC_OR_DEPARTMENT_OTHER)
Admission: RE | Admit: 2023-03-27 | Discharge: 2023-03-27 | Disposition: A | Discharge status: Home / Self Care | Payer: Medicare Other | Source: Ambulatory Visit | Attending: Family Medicine | Admitting: Family Medicine

## 2023-03-27 VITALS — BP 138/88 | HR 66 | Temp 97.6°F | Resp 18 | Ht 66.0 in

## 2023-03-27 DIAGNOSIS — M79605 Pain in left leg: Secondary | ICD-10-CM | POA: Insufficient documentation

## 2023-03-27 DIAGNOSIS — R600 Localized edema: Secondary | ICD-10-CM

## 2023-03-27 DIAGNOSIS — M5136 Other intervertebral disc degeneration, lumbar region with discogenic back pain only: Secondary | ICD-10-CM | POA: Diagnosis not present

## 2023-03-27 DIAGNOSIS — M79604 Pain in right leg: Secondary | ICD-10-CM | POA: Diagnosis not present

## 2023-03-27 DIAGNOSIS — M7121 Synovial cyst of popliteal space [Baker], right knee: Secondary | ICD-10-CM | POA: Diagnosis not present

## 2023-03-27 DIAGNOSIS — I700 Atherosclerosis of aorta: Secondary | ICD-10-CM | POA: Diagnosis not present

## 2023-03-27 DIAGNOSIS — M5126 Other intervertebral disc displacement, lumbar region: Secondary | ICD-10-CM | POA: Diagnosis not present

## 2023-03-27 LAB — COMPREHENSIVE METABOLIC PANEL
ALT: 16 U/L (ref 0–35)
AST: 18 U/L (ref 0–37)
Albumin: 3.8 g/dL (ref 3.5–5.2)
Alkaline Phosphatase: 120 U/L — ABNORMAL HIGH (ref 39–117)
BUN: 39 mg/dL — ABNORMAL HIGH (ref 6–23)
CO2: 29 meq/L (ref 19–32)
Calcium: 8.8 mg/dL (ref 8.4–10.5)
Chloride: 106 meq/L (ref 96–112)
Creatinine, Ser: 1.56 mg/dL — ABNORMAL HIGH (ref 0.40–1.20)
GFR: 29.52 mL/min — ABNORMAL LOW (ref >60.00)
Glucose, Bld: 135 mg/dL — ABNORMAL HIGH (ref 70–99)
Potassium: 4.7 meq/L (ref 3.5–5.1)
Sodium: 143 meq/L (ref 135–145)
Total Bilirubin: 0.6 mg/dL (ref 0.2–1.2)
Total Protein: 6.4 g/dL (ref 6.0–8.3)

## 2023-03-27 LAB — CBC WITH DIFFERENTIAL/PLATELET
Basophils Absolute: 0 10*3/uL (ref 0.0–0.1)
Basophils Relative: 0.3 % (ref 0.0–3.0)
Eosinophils Absolute: 0.2 10*3/uL (ref 0.0–0.7)
Eosinophils Relative: 2.1 % (ref 0.0–5.0)
HCT: 35.1 % — ABNORMAL LOW (ref 36.0–46.0)
Hemoglobin: 11.5 g/dL — ABNORMAL LOW (ref 12.0–15.0)
Lymphocytes Relative: 13.7 % (ref 12.0–46.0)
Lymphs Abs: 1.2 10*3/uL (ref 0.7–4.0)
MCHC: 32.7 g/dL (ref 30.0–36.0)
MCV: 99.7 fL (ref 78.0–100.0)
Monocytes Absolute: 0.6 10*3/uL (ref 0.1–1.0)
Monocytes Relative: 7 % (ref 3.0–12.0)
Neutro Abs: 6.7 10*3/uL (ref 1.4–7.7)
Neutrophils Relative %: 76.9 % (ref 43.0–77.0)
Platelets: 190 10*3/uL (ref 150.0–400.0)
RBC: 3.52 Mil/uL — ABNORMAL LOW (ref 3.87–5.11)
RDW: 14.1 % (ref 11.5–15.5)
WBC: 8.8 10*3/uL (ref 4.0–10.5)

## 2023-03-27 LAB — BRAIN NATRIURETIC PEPTIDE: Pro B Natriuretic peptide (BNP): 336 pg/mL — ABNORMAL HIGH (ref 0.0–100.0)

## 2023-03-27 MED ORDER — METHOCARBAMOL 500 MG PO TABS
500.0000 mg | ORAL_TABLET | Freq: Four times a day (QID) | ORAL | 0 refills | Status: DC | PRN
Start: 2023-03-27 — End: 2023-06-29

## 2023-03-27 MED ORDER — ENTRESTO 97-103 MG PO TABS: 1.0000 | ORAL_TABLET | Freq: Two times a day (BID) | ORAL | 0 refills | Status: DC

## 2023-03-27 NOTE — Progress Notes (Signed)
Established Patient Office Visit  Subjective   Patient ID: [name], female    DOB: 07/02/1932  Age: 89 y.o. MRN: [id number]  Chief Complaint  Patient presents with   Leg Pain    Right leg, pt states pain started a few hours ago. Pt states no falls or injuries.     HPI Discussed the use of AI scribe software for clinical note transcription with the patient, who gave verbal consent to proceed.  History of Present Illness   The patient presented with sudden onset of severe pain in her right leg, which she described as feeling like 'something grabbed me.' The pain was generalized, affecting the entire leg, and was associated with significant swelling, a new symptom for this leg. The patient denied any precipitating events or trauma, stating that the pain began while she was sitting on the floor. She also denied any associated chest pain or shortness of breath.  The patient has been taking Lasix, but had not taken it on the day of the consultation. She reported that her left leg usually swells, but the right leg swelling was a new occurrence. The patient also reported a sensation of 'something grabbing' her in the leg while sitting on the toilet, which she described as a spasm.  The patient was also taking Provencal, and inquired about increasing the dose in conjunction with the Lasix. She did not report any other new symptoms or changes in her health status.      Patient Active Problem List   Diagnosis Date Noted   Dermatitis associated with moisture 03/03/2023   Open wound of skin 03/03/2023   Acute pain of left hip 09/01/2022   Need for pneumococcal 20-valent conjugate vaccination 08/22/2022   Rash 05/27/2022   Tremor 05/27/2022   Coronary artery disease involving native coronary artery of native heart without angina pectoris 09/14/2021   Pain due to onychomycosis of toenails of both feet 01/15/2021   Type 2 diabetes mellitus with diabetic dermatitis, without long-term current  use of insulin (HCC) 01/15/2021   Palpitations 01/15/2018   Cardiac pacemaker in situ 01/15/2018   Heel spur, left 09/13/2017   Left knee pain 08/23/2017   CKD (chronic kidney disease), stage III (HCC) 12/04/2016   Chest pain 11/23/2015   Type I (juvenile type) diabetes mellitus with renal manifestations, not stated as uncontrolled(250.41) 04/27/2012   Insulin dependent diabetes mellitus with complications 04/27/2012   AKI (acute kidney injury) (HCC) 03/05/2012   Diarrhea 03/05/2012   Carotid artery disease (HCC) 08/11/2010   Left shoulder pain 07/08/2010   Preventative health care 07/08/2010   HOARSENESS 06/04/2010   Pruritus 05/11/2010   ANEMIA-NOS 04/02/2010   (HFpEF) heart failure with preserved ejection fraction (HCC) 02/11/2010   SINUS BRADYCARDIA 10/30/2009   Allergic rhinitis 09/11/2009   Backache 09/11/2009   MUSCLE STRAIN, RIGHT BUTTOCK 09/11/2009   Herpes zoster 05/11/2009   SHOULDER PAIN, LEFT 12/29/2008   Proteinuria 12/12/2008   Abdominal pain 09/24/2007   Hx of CABG 05/03/2007   CHEST PAIN 04/20/2007   Dizziness 02/28/2007   Anxiety state 02/15/2007   GERD 02/15/2007   Gastroparesis 02/15/2007   DISC DISEASE, CERVICAL 02/15/2007   DISC DISEASE, LUMBAR 02/15/2007   SPINAL STENOSIS, LUMBAR 02/15/2007   PERIPHERAL EDEMA 02/15/2007   Personal History of Other Diseases of Digestive Disease 02/15/2007   Hyperlipidemia LDL goal <70 01/01/2007   GOUT 01/01/2007   Morbid obesity (HCC) 01/01/2007   DEPRESSION 01/01/2007   PERIPHERAL VASCULAR DISEASE 01/01/2007  Diverticulosis of colon 01/01/2007   Osteoarthritis 01/01/2007   LOW BACK PAIN 01/01/2007   Disorder of bone and cartilage 01/01/2007   Essential hypertension 10/26/2006   Past Medical History:  Diagnosis Date   Allergic rhinitis    Anemia    Anxiety    Barrett esophagus    CAD (coronary artery disease) 2009   a. Multivessel s/p PCI w/DES 2009 // b. s/p CABG 2011  //  c. LHC 8/15: pLAD 95 ISR, LCx  100, pOM1 40, dRCA 100, S-OM1/OM2 ok, S-D1 ok, S-PDA ok, L-LAD ok, EF 60%   Carotid artery disease (HCC)    a. Carotid US 9/15: RICA 1-39%; LICA 40-59% >> FU 1 year  //  b. Carotid US 9/17: R 1-39%, L 40-59% >> FU 1 year   Chronic diastolic heart failure (HCC)    CKD (chronic kidney disease), stage II    GFR 60-89 ml/min   Depression    Disc disease, degenerative, cervical    Diverticulosis    Gastroparesis    GERD (gastroesophageal reflux disease)    Gout    H/O hiatal hernia    Helicobacter pylori gastritis    History of echocardiogram    a. Echo 11/13: EF 55% to 60%. Grade 2 diastolic dysfunction, MAC, trivial MR, mild LAE, normal RVSF, mild RAE, PASP 39 mmHg  //  b. 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   Type II or unspecified type diabetes mellitus without mention of complication, not stated as uncontrolled    Past Surgical History:  Procedure Laterality Date   ABDOMINAL HYSTERECTOMY     CARDIAC CATHETERIZATION     2011  DR COOPER (APPT NEXT WEEK)   CHOLECYSTECTOMY     CORONARY ARTERY BYPASS GRAFT  2011   LIMA-LAD, SVG-DIAG, SVG-OM1-OM2, SVG-PDA   CORONARY STENT PLACEMENT     Drug-eluting stent to the left anterior descending, circumflex and right coronary artery in Jan 2009   EYE SURGERY     BIL CATARACT REMOVAL 06/2010   LEFT HEART CATHETERIZATION WITH CORONARY ANGIOGRAM N/A 12/02/2013   Procedure: LEFT HEART CATHETERIZATION WITH CORONARY ANGIOGRAM;  Surgeon: Lesleigh Noe, MD;  Location: [facility] CATH LAB;  Service: Cardiovascular;   Laterality: N/A;   OVARIAN CYST REMOVAL     PACEMAKER INSERTION  03/06/12   MDT Adapta L implanted by Dr Johney Frame for SSS   PERMANENT PACEMAKER INSERTION N/A 03/06/2012   Procedure: PERMANENT PACEMAKER INSERTION;  Surgeon: Hillis Range, MD;  Location: [facility] CATH LAB;  Service: Cardiovascular;  Laterality: N/A;   SHOULDER ARTHROSCOPY  06/16/2011   Procedure: ARTHROSCOPY SHOULDER;  Surgeon: Kennieth Rad, MD;  Location: [facility] OR;  Service: Orthopedics;  Laterality: Left;  LEFT SHOULDER ARTHROSCOPY ACROMIALPLASTY, POSSIBLE MINI OPEN CUFF REPAIR    TUBAL LIGATION     Social History   Tobacco Use   Smoking status: Former    Current packs/day: 0.00    Average packs/day: 0.3 packs/day for 28.0 years (7.0 ttl  pk-yrs)    Types: Cigarettes    Start date: 27    Quit date: 26    Years since quitting: 30.9   Smokeless tobacco: Never   Tobacco comments:    quit 30 yrs ago  Vaping Use   Vaping status: Never Used  Substance Use Topics   Alcohol use: No   Drug use: No   Social History   Socioeconomic History   Marital status: Widowed    Spouse name: Not on file   Number of children: Not on file   Years of education: Not on file   Highest education level: Not on file  Occupational History   Occupation: RETIRED LPN    Comment: worked as [employer]  Tobacco Use   Smoking status: Former    Current packs/day: 0.00    Average packs/day: 0.3 packs/day for 28.0 years (7.0 ttl pk-yrs)    Types: Cigarettes    Start date: 59    Quit date: 55    Years since quitting: 30.9   Smokeless tobacco: Never   Tobacco comments:    quit 30 yrs ago  Vaping Use   Vaping status: Never Used  Substance and Sexual Activity   Alcohol use: No   Drug use: No   Sexual activity: Not on file  Other Topics Concern   Not on file  Social History Narrative   Widowed 2004.., Lives with daughter and grand son ,Family history is negative for premature coronary artery disease. Mother died at age 41 with heart disease in  her later years, father died at age 20 from a stroke.Marland KitchenShe  has 8 siblings, none of whom have coronary artery disease.   Social Drivers of Corporate investment banker Strain: Low Risk  (11/02/2022)   Overall Financial Resource Strain (CARDIA)    Difficulty of Paying Living Expenses: Not hard at all  Food Insecurity: No Food Insecurity (11/02/2022)   Hunger Vital Sign    Worried About Running Out of Food in the Last Year: Never true    Ran Out of Food in the Last Year: Never true  Transportation Needs: No Transportation Needs (11/02/2022)   PRAPARE - Administrator, Civil Service (Medical): No    Lack of Transportation (Non-Medical): No  Physical Activity: Insufficiently Active (11/02/2022)   Exercise Vital Sign    Days of Exercise per Week: 5 days    Minutes of Exercise per Session: 20 min  Stress: No Stress Concern Present (11/02/2022)   [employer] of Occupational Health - Occupational Stress Questionnaire    Feeling of Stress : Only a little  Social Connections: Moderately Isolated (11/02/2022)   Social Connection and Isolation Panel [NHANES]    Frequency of Communication with Friends and Family: More than three times a week    Frequency of Social Gatherings with Friends and Family: Once a week    Attends Religious Services: More than 4 times per year    Active Member of [employer] or Organizations: No    Attends Banker Meetings: Never    Marital Status: Widowed  Intimate Partner Violence: Not At Risk (11/02/2022)   Humiliation, Afraid, Rape, and Kick questionnaire    Fear of Current or Ex-Partner: No    Emotionally Abused: No    Physically Abused: No    Sexually Abused: No   Family Status  Relation Name Status   Mother  Deceased       Old Age   Father  Deceased   Sister  Deceased   Sister  Deceased   Brother  Alive   Brother  Alive   Brother  Deceased   Brother  Deceased   Brother  Deceased   Brother  Deceased   MGM  Deceased   MGF  Deceased    PGM  Deceased   PGF  Deceased   Daughter  Alive   Daughter  Alive   Daughter  Alive   Son  Alive   Son  Deceased   Other  (Not Specified)  No partnership data on file   Family History  Problem Relation Age of Onset   Diabetes Mother    Hypertension Mother    Heart attack Mother    Stroke Father    Cancer Brother    Deafness Daughter    Other Son        cerebral edema at 18 months   Coronary artery disease Other    Allergies  Allergen Reactions   Ciprofloxacin Nausea And Vomiting and Other (See Comments)    Syncope, also    Codeine Other (See Comments)    HALLUCINATIONS   Hydrocodone Other (See Comments)    Made her pass out   Penicillins Hives   Shellfish Allergy Hives   Diltiazem Hcl Other (See Comments)    Low heart rate   Sulfonamide Derivatives Nausea And Vomiting   Morphine And Codeine Other (See Comments)    "Went crazy"   Lovastatin    Metformin Diarrhea      Review of Systems  Constitutional:  Negative for fever and malaise/fatigue.  HENT:  Negative for congestion.   Eyes:  Negative for blurred vision.  Respiratory:  Negative for cough and shortness of breath.   Cardiovascular:  Negative for chest pain, palpitations and leg swelling.  Gastrointestinal:  Negative for abdominal pain, blood in stool, nausea and vomiting.  Genitourinary:  Negative for dysuria and frequency.  Musculoskeletal:  Negative for back pain and falls.  Skin:  Negative for rash.  Neurological:  Negative for dizziness, loss of consciousness and headaches.  Endo/Heme/Allergies:  Negative for environmental allergies.  Psychiatric/Behavioral:  Negative for depression. The patient is not nervous/anxious.       Objective:     BP 138/88 (BP Location: Left Arm, Patient Position: Sitting, Cuff Size: Large)   Pulse 66   Temp 97.6 F (36.4 C) (Oral)   Resp 18   Ht 5\' 6"  (1.676 m)   BMI 34.69 kg/m  BP Readings from Last 3 Encounters:  03/27/23 138/88  03/03/23 (!) 150/60   01/02/23 136/72   Wt Readings from Last 3 Encounters:  03/03/23 214 lb 14.4 oz (97.5 kg)  01/02/23 188 lb 2 oz (85.3 kg)  11/02/22 184 lb (83.5 kg)   SpO2 Readings from Last 3 Encounters:  01/02/23 97%  10/31/22 99%  10/14/22 96%      Physical Exam Vitals and nursing note reviewed.  Constitutional:      General: She is not in acute distress.    Appearance: Normal appearance. She is well-developed.  HENT:     Head: Normocephalic and atraumatic.  Eyes:     General: No scleral icterus.       Right eye: No discharge.        Left eye: No discharge.  Cardiovascular:     Rate and Rhythm: Normal rate and regular rhythm.     Heart sounds: No murmur heard. Pulmonary:     Effort: Pulmonary effort is normal. No respiratory distress.  Breath sounds: Normal breath sounds.  Musculoskeletal:        General: Swelling and tenderness present.     Cervical back: Normal range of motion and neck supple.     Right upper leg: Tenderness present.     Left upper leg: Normal.     Right knee: No swelling, effusion or erythema. Decreased range of motion. Tenderness present.     Left knee: Normal.     Right lower leg: Tenderness present. Edema present.     Left lower leg: No edema.  Skin:    General: Skin is warm and dry.  Neurological:     Mental Status: She is alert and oriented to person, place, and time.  Psychiatric:        Mood and Affect: Mood normal.        Behavior: Behavior normal.        Thought Content: Thought content normal.        Judgment: Judgment normal.      No results found for any visits on 03/27/23.  Last CBC Lab Results  Component Value Date   WBC 7.9 10/14/2022   HGB 11.0 (L) 10/14/2022   HCT 34.3 (L) 10/14/2022   MCV 97.3 10/14/2022   MCH 31.7 09/02/2022   RDW 14.7 10/14/2022   PLT 201.0 10/14/2022   Last metabolic panel Lab Results  Component Value Date   GLUCOSE 164 (H) 10/14/2022   NA 140 10/14/2022   K 4.1 10/14/2022   CL 104 10/14/2022    CO2 26 10/14/2022   BUN 40 (H) 10/14/2022   CREATININE 1.68 (H) 10/14/2022   GFR 27.09 (L) 10/14/2022   CALCIUM 8.7 10/14/2022   PHOS 3.0 03/06/2012   PROT 6.2 10/14/2022   ALBUMIN 3.4 (L) 10/14/2022   BILITOT 0.4 10/14/2022   ALKPHOS 97 10/14/2022   AST 15 10/14/2022   ALT 10 10/14/2022   ANIONGAP 9 09/02/2022   Last lipids Lab Results  Component Value Date   CHOL 121 10/14/2022   HDL 58.80 10/14/2022   LDLCALC 32 10/14/2022   LDLDIRECT 82.5 09/11/2009   TRIG 152.0 (H) 10/14/2022   CHOLHDL 2 10/14/2022   Last hemoglobin A1c Lab Results  Component Value Date   HGBA1C 7.2 (H) 10/14/2022   Last thyroid functions Lab Results  Component Value Date   TSH 4.78 03/31/2022   Last vitamin D Lab Results  Component Value Date   VD25OH 41.98 03/31/2022      The ASCVD Risk score (Arnett DK, et al., 2019) failed to calculate for the following reasons:   The 2019 ASCVD risk score is only valid for ages 59 to 61    Assessment & Plan:   Problem List Items Addressed This Visit   None Visit Diagnoses       Lower extremity edema    -  Primary   Relevant Orders   Comprehensive metabolic panel   CBC with Differential/Platelet   US Venous Img Lower Unilateral Right (DVT)   B Nat Peptide     Pain of left lower extremity       Relevant Medications   methocarbamol (ROBAXIN) 500 MG tablet   Other Relevant Orders   US Venous Img Lower Unilateral Right (DVT)   DG Lumbar Spine Complete     Assessment and Plan    Right Leg Pain and Swelling   She presented with acute onset of severe, diffuse pain and significant swelling in her right leg, starting a few hours ago without  any history of trauma or specific inciting event. The differential diagnosis includes DVT and muscle spasm. Given the absence of chest pain or shortness of breath, the likelihood of pulmonary embolism is reduced. After obtaining informed consent for an ultrasound, which included a discussion on the risks  (discomfort, false positives/negatives), benefits (accurate diagnosis), and alternatives (clinical observation), we will order an ultrasound of the right leg to rule out DVT. We will increase furosemide to 2 tablets daily for 4 days and order blood work. A muscle relaxant will be administered for pain relief. A follow-up visit is scheduled at the end of the week to reassess her condition.  General Health Maintenance   Routine health maintenance was discussed. She is currently taking furosemide for fluid management. We advised the continuation of furosemide as prescribed and to contact the clinic if symptoms worsen or new symptoms develop.        No follow-ups on file.    Donato Schultz, DO

## 2023-03-30 ENCOUNTER — Other Ambulatory Visit: Payer: Self-pay | Admitting: Family Medicine

## 2023-03-30 ENCOUNTER — Ambulatory Visit: Payer: Self-pay

## 2023-03-30 DIAGNOSIS — R600 Localized edema: Secondary | ICD-10-CM

## 2023-03-30 DIAGNOSIS — M159 Polyosteoarthritis, unspecified: Secondary | ICD-10-CM

## 2023-03-30 DIAGNOSIS — N1832 Chronic kidney disease, stage 3b: Secondary | ICD-10-CM

## 2023-03-30 DIAGNOSIS — L308 Other specified dermatitis: Secondary | ICD-10-CM

## 2023-03-30 DIAGNOSIS — M79605 Pain in left leg: Secondary | ICD-10-CM

## 2023-03-30 NOTE — Patient Outreach (Signed)
  Care Coordination   Follow Up Visit Note   03/30/2023 Name: [name] MRN: [id number] DOB: 28-Feb-1933  [name] is a 89 y.o. year old female who sees [name], Grayling Congress, DO for primary care. I spoke with  [name] by phone today.  What matters to the patients health and wellness today?  [name] reports primary concern today is pain to her right leg. She reports office visit 03/27/23 and states she has increased furosemide as recommended by provider. [name] states the swelling has gone down in her legs, but she continues to have pain making it difficult to walk. And states muscle relaxant makes her dizzy. She also reports itching is very bad-dermatologist prescribed solution to bath in, but she states she is unable to bath herself. She reports she had someone come to bath her, but states this may have ended. Patient is unable to state the name of the agency.  Per review of chart patient had [facility] home [facility] at one time. RNCM contacted [facility] who reports they have not been involved with patient since June 2024. RNCM assisted patient in getting a follow up appointment with PCP for tomorrow to follow up.   Goals Addressed             This Visit's Progress    Assist with health management       Interventions Today    Flowsheet Row Most Recent Value  Chronic Disease   Chronic disease during today's visit Other, Congestive Heart Failure (CHF)  [right leg pain]  General Interventions   General Interventions Discussed/Reviewed General Interventions Reviewed, Doctor Visits, Communication with  [Evaluation of current treatment plan for health condition and patient's adherence to plan.]  Doctor Visits Discussed/Reviewed PCP  PCP/Specialist Visits Compliance with follow-up visit  [reviewed upcoming scheduled appointments]  Communication with PCP/Specialists  [assist with scheduled follow up appointment with primary care provider 03/31/23. message Primary care to  update.]  Education Interventions   Education Provided Provided Education  Provided Verbal Education On When to see the doctor, Medication, Nutrition  [advised to eat healthy, take medications as prescribed, communicate with providers as needed for follow up.]  Pharmacy Interventions   Pharmacy Dicussed/Reviewed Pharmacy Topics Reviewed  [reviewed medications. confirmed patient took lasix as recommended by provider at office visit  03/27/23 (increase to 2 tablets daily for 4 days). confirmed patient has all her medications.]  Safety Interventions   Safety Discussed/Reviewed Safety Discussed, Fall Risk, Home Safety  Home Safety Contact home health agency, Contact provider for referral to PT/OT  [message to request PT or OT and bath aide.]             SDOH assessments and interventions completed:  No  Care Coordination Interventions:  Yes, provided   Follow up plan: Follow up call scheduled for 04/20/23    Encounter Outcome:  Patient Visit Completed   Kathyrn Sheriff, RN, MSN, BSN, CCM Care Management Coordinator [phone]

## 2023-03-30 NOTE — Patient Instructions (Addendum)
Visit Information  Thank you for taking time to visit with me today. Please don't hesitate to contact me if I can be of assistance to you.   Following are the goals we discussed today:  You have an appointment with your primary care provider on 03/31/23 at 11:00 am. Continue to take medications as prescribed. Continue to attend provider visits as scheduled Continue to eat healthy, lean meats, vegetables, fruits, avoid saturated and transfats Contact provider with health questions or concerns as needed  Our next appointment is by telephone on 04/19/22 at 10:00 am  Please call the care guide team at [phone] if you need to cancel or reschedule your appointment.   If you are experiencing a Mental Health or Behavioral Health Crisis or need someone to talk to, please call the Suicide and Crisis Lifeline: 988 call the [place] National Suicide Prevention Lifeline: 289-256-3022 or TTY: 541-882-0599 TTY 219-243-0692) to talk to a trained counselor  Kathyrn Sheriff, RN, MSN, BSN, CCM Care Management Coordinator [phone]

## 2023-03-31 ENCOUNTER — Ambulatory Visit (HOSPITAL_BASED_OUTPATIENT_CLINIC_OR_DEPARTMENT_OTHER)
Admission: RE | Admit: 2023-03-31 | Discharge: 2023-03-31 | Disposition: A | Discharge status: Home / Self Care | Payer: Medicare Other | Source: Ambulatory Visit | Attending: Family Medicine | Admitting: Family Medicine

## 2023-03-31 ENCOUNTER — Encounter: Payer: Self-pay | Admitting: Family Medicine

## 2023-03-31 ENCOUNTER — Ambulatory Visit (INDEPENDENT_AMBULATORY_CARE_PROVIDER_SITE_OTHER): Payer: Medicare Other | Admitting: Family Medicine

## 2023-03-31 VITALS — BP 140/88 | HR 60 | Temp 97.5°F | Resp 20 | Ht 66.0 in

## 2023-03-31 DIAGNOSIS — M79604 Pain in right leg: Secondary | ICD-10-CM

## 2023-03-31 DIAGNOSIS — L309 Dermatitis, unspecified: Secondary | ICD-10-CM | POA: Diagnosis not present

## 2023-03-31 DIAGNOSIS — M25561 Pain in right knee: Secondary | ICD-10-CM | POA: Diagnosis not present

## 2023-03-31 DIAGNOSIS — R600 Localized edema: Secondary | ICD-10-CM | POA: Diagnosis not present

## 2023-03-31 DIAGNOSIS — M1711 Unilateral primary osteoarthritis, right knee: Secondary | ICD-10-CM | POA: Diagnosis not present

## 2023-03-31 DIAGNOSIS — R197 Diarrhea, unspecified: Secondary | ICD-10-CM | POA: Diagnosis not present

## 2023-03-31 DIAGNOSIS — M85861 Other specified disorders of bone density and structure, right lower leg: Secondary | ICD-10-CM | POA: Diagnosis not present

## 2023-03-31 DIAGNOSIS — R0602 Shortness of breath: Secondary | ICD-10-CM | POA: Diagnosis not present

## 2023-03-31 DIAGNOSIS — M25461 Effusion, right knee: Secondary | ICD-10-CM | POA: Diagnosis not present

## 2023-03-31 LAB — COMPREHENSIVE METABOLIC PANEL
ALT: 19 U/L (ref 0–35)
AST: 22 U/L (ref 0–37)
Albumin: 3.7 g/dL (ref 3.5–5.2)
Alkaline Phosphatase: 121 U/L — ABNORMAL HIGH (ref 39–117)
BUN: 42 mg/dL — ABNORMAL HIGH (ref 6–23)
CO2: 30 meq/L (ref 19–32)
Calcium: 8.5 mg/dL (ref 8.4–10.5)
Chloride: 104 meq/L (ref 96–112)
Creatinine, Ser: 1.58 mg/dL — ABNORMAL HIGH (ref 0.40–1.20)
GFR: 29.07 mL/min — ABNORMAL LOW (ref >60.00)
Glucose, Bld: 141 mg/dL — ABNORMAL HIGH (ref 70–99)
Potassium: 4.2 meq/L (ref 3.5–5.1)
Sodium: 142 meq/L (ref 135–145)
Total Bilirubin: 0.6 mg/dL (ref 0.2–1.2)
Total Protein: 6.2 g/dL (ref 6.0–8.3)

## 2023-03-31 LAB — BRAIN NATRIURETIC PEPTIDE: Pro B Natriuretic peptide (BNP): 255 pg/mL — ABNORMAL HIGH (ref 0.0–100.0)

## 2023-03-31 MED ORDER — FIDAXOMICIN 200 MG PO TABS: 200.0000 mg | ORAL_TABLET | Freq: Two times a day (BID) | ORAL | 0 refills | Status: DC

## 2023-03-31 MED ORDER — DICYCLOMINE HCL 10 MG PO CAPS: 10.0000 mg | ORAL_CAPSULE | Freq: Three times a day (TID) | ORAL | 0 refills | Status: DC

## 2023-03-31 MED ORDER — NONFORMULARY OR COMPOUNDED ITEM: 0 refills | Status: DC

## 2023-03-31 MED ORDER — CLOTRIMAZOLE-BETAMETHASONE 1-0.05 % EX CREA: 1.0000 | TOPICAL_CREAM | Freq: Every day | CUTANEOUS | 0 refills | Status: DC

## 2023-03-31 NOTE — Progress Notes (Signed)
Established Patient Office Visit  Subjective   Patient ID: [name], female    DOB: 03-10-1933  Age: 89 y.o. MRN: [id number]  Chief Complaint  Patient presents with   Edema   Follow-up    HPI The patient, with a history of congestive heart failure, presents with persistent lower extremity weakness and swelling. The weakness is primarily localized to the right leg, particularly around the knee. The patient reports that the swelling is most severe in the mornings, approximately 45 minutes after waking up. Despite doubling up on Lasix for a few days, which provided some relief, the swelling persists. The patient also reports a month-long history of diarrhea, which started during a rehabilitation stay. The diarrhea is described as difficult to control, even with the use of Imodium.  Additionally, the patient has been experiencing severe itching all over the body, particularly in the back area where there are palpable lumps. The itching has been ongoing despite the use of prescribed creams and lotions. The patient also reports a history of skin ulcers, one of which is still healing on the back, and another in the groin area.  The patient is currently on a muscle relaxer for the leg weakness, which has provided some relief. However, the patient still describes the legs as feeling heavy, like she is weighed down. The patient's symptoms have been ongoing despite recent medical interventions. Discussed the use of AI scribe software for clinical note transcription with the patient, who gave verbal consent to proceed.  History of Present Illness   The patient, with a history of congestive heart failure, presents with persistent lower extremity weakness and swelling. The weakness is primarily localized to the right leg, particularly around the knee. The patient reports that the swelling is most severe in the mornings, approximately 45 minutes after waking up. Despite doubling up on Lasix for a few  days, which provided some relief, the swelling persists. The patient also reports a month-long history of diarrhea, which started during a rehabilitation stay. The diarrhea is described as difficult to control, even with the use of Imodium.  Additionally, the patient has been experiencing severe itching all over the body, particularly in the back area where there are palpable lumps. The itching has been ongoing despite the use of prescribed creams and lotions. The patient also reports a history of skin ulcers, one of which is still healing on the back, and another in the groin area.  The patient is currently on a muscle relaxer for the leg weakness, which has provided some relief. However, the patient still describes the legs as feeling heavy, like she is weighed down. The patient's symptoms have been ongoing despite recent medical interventions.          Patient Active Problem List   Diagnosis Date Noted   Dermatitis associated with moisture 03/03/2023   Open wound of skin 03/03/2023   Acute pain of left hip 09/01/2022   Need for pneumococcal 20-valent conjugate vaccination 08/22/2022   Rash 05/27/2022   Tremor 05/27/2022   Coronary artery disease involving native coronary artery of native heart without angina pectoris 09/14/2021   Pain due to onychomycosis of toenails of both feet 01/15/2021   Type 2 diabetes mellitus with diabetic dermatitis, without long-term current use of insulin (HCC) 01/15/2021   Palpitations 01/15/2018   Cardiac pacemaker in situ 01/15/2018   Heel spur, left 09/13/2017   Left knee pain 08/23/2017   CKD (chronic kidney disease), stage III (HCC) 12/04/2016  Chest pain 11/23/2015   Type I (juvenile type) diabetes mellitus with renal manifestations, not stated as uncontrolled(250.41) 04/27/2012   Insulin dependent diabetes mellitus with complications 04/27/2012   AKI (acute kidney injury) (HCC) 03/05/2012   Diarrhea 03/05/2012   Carotid artery disease (HCC)  08/11/2010   Left shoulder pain 07/08/2010   Preventative health care 07/08/2010   HOARSENESS 06/04/2010   Pruritus 05/11/2010   ANEMIA-NOS 04/02/2010   (HFpEF) heart failure with preserved ejection fraction (HCC) 02/11/2010   SINUS BRADYCARDIA 10/30/2009   Allergic rhinitis 09/11/2009   Backache 09/11/2009   MUSCLE STRAIN, RIGHT BUTTOCK 09/11/2009   Herpes zoster 05/11/2009   SHOULDER PAIN, LEFT 12/29/2008   Proteinuria 12/12/2008   Abdominal pain 09/24/2007   Hx of CABG 05/03/2007   CHEST PAIN 04/20/2007   Dizziness 02/28/2007   Anxiety state 02/15/2007   GERD 02/15/2007   Gastroparesis 02/15/2007   DISC DISEASE, CERVICAL 02/15/2007   DISC DISEASE, LUMBAR 02/15/2007   SPINAL STENOSIS, LUMBAR 02/15/2007   PERIPHERAL EDEMA 02/15/2007   Personal History of Other Diseases of Digestive Disease 02/15/2007   Hyperlipidemia LDL goal <70 01/01/2007   GOUT 01/01/2007   Morbid obesity (HCC) 01/01/2007   DEPRESSION 01/01/2007   PERIPHERAL VASCULAR DISEASE 01/01/2007   Diverticulosis of colon 01/01/2007   Osteoarthritis 01/01/2007   LOW BACK PAIN 01/01/2007   Disorder of bone and cartilage 01/01/2007   Essential hypertension 10/26/2006   Past Medical History:  Diagnosis Date   Allergic rhinitis    Anemia    Anxiety    Barrett esophagus    CAD (coronary artery disease) 2009   a. Multivessel s/p PCI w/DES 2009 // b. s/p CABG 2011  //  c. LHC 8/15: pLAD 95 ISR, LCx 100, pOM1 40, dRCA 100, S-OM1/OM2 ok, S-D1 ok, S-PDA ok, L-LAD ok, EF 60%   Carotid artery disease (HCC)    a. Carotid US 9/15: RICA 1-39%; LICA 40-59% >> FU 1 year  //  b. Carotid US 9/17: R 1-39%, L 40-59% >> FU 1 year   Chronic diastolic heart failure (HCC)    CKD (chronic kidney disease), stage II    GFR 60-89 ml/min   Depression    Disc disease, degenerative, cervical    Diverticulosis    Gastroparesis    GERD (gastroesophageal reflux disease)    Gout    H/O hiatal hernia    Helicobacter pylori gastritis     History of echocardiogram    a. Echo 11/13: EF 55% to 60%. Grade 2 diastolic dysfunction, MAC, trivial MR, mild LAE, normal RVSF, mild RAE, PASP 39 mmHg  //  b. 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   Type II or unspecified type diabetes mellitus without mention of complication, not stated as uncontrolled    Past Surgical History:  Procedure Laterality Date   ABDOMINAL HYSTERECTOMY     CARDIAC CATHETERIZATION     2011  DR COOPER (APPT NEXT WEEK)   CHOLECYSTECTOMY  CORONARY ARTERY BYPASS GRAFT  2011   LIMA-LAD, SVG-DIAG, SVG-OM1-OM2, SVG-PDA   CORONARY STENT PLACEMENT     Drug-eluting stent to the left anterior descending, circumflex and right coronary artery in Jan 2009   EYE SURGERY     BIL CATARACT REMOVAL 06/2010   LEFT HEART CATHETERIZATION WITH CORONARY ANGIOGRAM N/A 12/02/2013   Procedure: LEFT HEART CATHETERIZATION WITH CORONARY ANGIOGRAM;  Surgeon: Lesleigh Noe, MD;  Location: [facility] CATH LAB;  Service: Cardiovascular;  Laterality: N/A;   OVARIAN CYST REMOVAL     PACEMAKER INSERTION  03/06/12   MDT Adapta L implanted by Dr Johney Frame for SSS   PERMANENT PACEMAKER INSERTION N/A 03/06/2012   Procedure: PERMANENT PACEMAKER INSERTION;  Surgeon: Hillis Range, MD;  Location: [facility] CATH LAB;  Service: Cardiovascular;  Laterality: N/A;   SHOULDER ARTHROSCOPY  06/16/2011   Procedure: ARTHROSCOPY SHOULDER;  Surgeon: Kennieth Rad, MD;  Location: [facility] OR;  Service: Orthopedics;  Laterality: Left;  LEFT SHOULDER ARTHROSCOPY ACROMIALPLASTY, POSSIBLE MINI OPEN  CUFF REPAIR    TUBAL LIGATION     Social History   Tobacco Use   Smoking status: Former    Current packs/day: 0.00    Average packs/day: 0.3 packs/day for 28.0 years (7.0 ttl pk-yrs)    Types: Cigarettes    Start date: 81    Quit date: 89    Years since quitting: 30.9   Smokeless tobacco: Never   Tobacco comments:    quit 30 yrs ago  Vaping Use   Vaping status: Never Used  Substance Use Topics   Alcohol use: No   Drug use: No   Social History   Socioeconomic History   Marital status: Widowed    Spouse name: Not on file   Number of children: Not on file   Years of education: Not on file   Highest education level: Not on file  Occupational History   Occupation: RETIRED LPN    Comment: worked as [employer]  Tobacco Use   Smoking status: Former    Current packs/day: 0.00    Average packs/day: 0.3 packs/day for 28.0 years (7.0 ttl pk-yrs)    Types: Cigarettes    Start date: 74    Quit date: 84    Years since quitting: 30.9   Smokeless tobacco: Never   Tobacco comments:    quit 30 yrs ago  Vaping Use   Vaping status: Never Used  Substance and Sexual Activity   Alcohol use: No   Drug use: No   Sexual activity: Not on file  Other Topics Concern   Not on file  Social History Narrative   Widowed 2004.., Lives with daughter and grand son ,Family history is negative for premature coronary artery disease. Mother died at age 65 with heart disease in her later years, father died at age 63 from a stroke.Marland KitchenShe  has 8 siblings, none of whom have coronary artery disease.   Social Drivers of Corporate investment banker Strain: Low Risk  (11/02/2022)   Overall Financial Resource Strain (CARDIA)    Difficulty of Paying Living Expenses: Not hard at all  Food Insecurity: No Food Insecurity (11/02/2022)   Hunger Vital Sign    Worried About Running Out of Food in the Last Year: Never true    Ran Out of Food in the Last Year: Never true  Transportation Needs: No Transportation  Needs (11/02/2022)   PRAPARE - Administrator, Civil Service (Medical): No    Lack of Transportation (  Non-Medical): No  Physical Activity: Insufficiently Active (11/02/2022)   Exercise Vital Sign    Days of Exercise per Week: 5 days    Minutes of Exercise per Session: 20 min  Stress: No Stress Concern Present (11/02/2022)   [employer] of Occupational Health - Occupational Stress Questionnaire    Feeling of Stress : Only a little  Social Connections: Moderately Isolated (11/02/2022)   Social Connection and Isolation Panel [NHANES]    Frequency of Communication with Friends and Family: More than three times a week    Frequency of Social Gatherings with Friends and Family: Once a week    Attends Religious Services: More than 4 times per year    Active Member of [employer] or Organizations: No    Attends Banker Meetings: Never    Marital Status: Widowed  Intimate Partner Violence: Not At Risk (11/02/2022)   Humiliation, Afraid, Rape, and Kick questionnaire    Fear of Current or Ex-Partner: No    Emotionally Abused: No    Physically Abused: No    Sexually Abused: No   Family Status  Relation Name Status   Mother  Deceased       Old Age   Father  Deceased   Sister  Deceased   Sister  Deceased   Brother  Alive   Brother  Alive   Brother  Deceased   Brother  Deceased   Brother  Deceased   Brother  Deceased   MGM  Deceased   MGF  Deceased   PGM  Deceased   PGF  Deceased   Daughter  Corporate treasurer  Alive   Daughter  Alive   Son  Alive   Son  Deceased   Other  (Not Specified)  No partnership data on file   Family History  Problem Relation Age of Onset   Diabetes Mother    Hypertension Mother    Heart attack Mother    Stroke Father    Cancer Brother    Deafness Daughter    Other Son        cerebral edema at 18 months   Coronary artery disease Other    Allergies  Allergen Reactions   Ciprofloxacin Nausea And Vomiting and Other (See Comments)     Syncope, also    Codeine Other (See Comments)    HALLUCINATIONS   Hydrocodone Other (See Comments)    Made her pass out   Penicillins Hives   Shellfish Allergy Hives   Diltiazem Hcl Other (See Comments)    Low heart rate   Sulfonamide Derivatives Nausea And Vomiting   Morphine And Codeine Other (See Comments)    "Went crazy"   Lovastatin    Metformin Diarrhea      Review of Systems  Constitutional:  Negative for fever and malaise/fatigue.  HENT:  Negative for congestion.   Eyes:  Negative for blurred vision.  Respiratory:  Negative for cough and shortness of breath.   Cardiovascular:  Positive for leg swelling. Negative for chest pain and palpitations.  Gastrointestinal:  Negative for vomiting.  Musculoskeletal:  Positive for joint pain. Negative for back pain.  Skin:  Negative for rash.  Neurological:  Negative for loss of consciousness and headaches.      Objective:     BP (!) 140/88 (BP Location: Left Arm, Patient Position: Sitting, Cuff Size: Large)   Pulse 60   Temp (!) 97.5 F (36.4 C) (Oral)   Resp 20   Ht 5\' 6"  (  1.676 m)   SpO2 98%   BMI 34.69 kg/m  BP Readings from Last 3 Encounters:  03/31/23 (!) 140/88  03/27/23 138/88  03/03/23 (!) 150/60   Wt Readings from Last 3 Encounters:  03/03/23 214 lb 14.4 oz (97.5 kg)  01/02/23 188 lb 2 oz (85.3 kg)  11/02/22 184 lb (83.5 kg)   SpO2 Readings from Last 3 Encounters:  03/31/23 98%  01/02/23 97%  10/31/22 99%      Physical Exam Vitals and nursing note reviewed.  Constitutional:      General: She is not in acute distress.    Appearance: Normal appearance. She is well-developed.  HENT:     Head: Normocephalic and atraumatic.  Eyes:     General: No scleral icterus.       Right eye: No discharge.        Left eye: No discharge.  Cardiovascular:     Rate and Rhythm: Normal rate and regular rhythm.     Heart sounds: No murmur heard. Pulmonary:     Effort: Pulmonary effort is normal. No  respiratory distress.     Breath sounds: Normal breath sounds.  Musculoskeletal:        General: Tenderness present. Normal range of motion.     Cervical back: Normal range of motion and neck supple.     Right knee: Bony tenderness present. Tenderness present over the patellar tendon.     Right lower leg: Pitting Edema present.     Left lower leg: No edema.  Skin:    General: Skin is warm and dry.  Neurological:     General: No focal deficit present.     Mental Status: She is alert and oriented to person, place, and time.  Psychiatric:        Mood and Affect: Mood normal.        Behavior: Behavior normal.        Thought Content: Thought content normal.        Judgment: Judgment normal.      No results found for any visits on 03/31/23.  Last CBC Lab Results  Component Value Date   WBC 8.8 03/27/2023   HGB 11.5 (L) 03/27/2023   HCT 35.1 (L) 03/27/2023   MCV 99.7 03/27/2023   MCH 31.7 09/02/2022   RDW 14.1 03/27/2023   PLT 190.0 03/27/2023   Last metabolic panel Lab Results  Component Value Date   GLUCOSE 135 (H) 03/27/2023   NA 143 03/27/2023   K 4.7 03/27/2023   CL 106 03/27/2023   CO2 29 03/27/2023   BUN 39 (H) 03/27/2023   CREATININE 1.56 (H) 03/27/2023   GFR 29.52 (L) 03/27/2023   CALCIUM 8.8 03/27/2023   PHOS 3.0 03/06/2012   PROT 6.4 03/27/2023   ALBUMIN 3.8 03/27/2023   BILITOT 0.6 03/27/2023   ALKPHOS 120 (H) 03/27/2023   AST 18 03/27/2023   ALT 16 03/27/2023   ANIONGAP 9 09/02/2022   Last lipids Lab Results  Component Value Date   CHOL 121 10/14/2022   HDL 58.80 10/14/2022   LDLCALC 32 10/14/2022   LDLDIRECT 82.5 09/11/2009   TRIG 152.0 (H) 10/14/2022   CHOLHDL 2 10/14/2022   Last hemoglobin A1c Lab Results  Component Value Date   HGBA1C 7.2 (H) 10/14/2022   Last thyroid functions Lab Results  Component Value Date   TSH 4.78 03/31/2022   Last vitamin D Lab Results  Component Value Date   VD25OH 41.98 03/31/2022   Last vitamin B12  and Folate Lab Results  Component Value Date   VITAMINB12 318 03/31/2022      The ASCVD Risk score (Arnett DK, et al., 2019) failed to calculate for the following reasons:   The 2019 ASCVD risk score is only valid for ages 63 to 47    Assessment & Plan:   Problem List Items Addressed This Visit       Unprioritized   Diarrhea   Relevant Medications   dicyclomine (BENTYL) 10 MG capsule   Other Relevant Orders   Comprehensive metabolic panel   Cdiff NAA+O+P+Stool Culture   Other Visit Diagnoses       Lower extremity edema    -  Primary   Relevant Medications   NONFORMULARY OR COMPOUNDED ITEM   Other Relevant Orders   Brain natriuretic peptide   Comprehensive metabolic panel     Right leg pain       Relevant Orders   Ambulatory referral to Sports Medicine   DG Knee Complete 4 Views Right     Dermatitis       Relevant Medications   clotrimazole-betamethasone (LOTRISONE) cream     Assessment and Plan    Leg Pain and Weakness   Chronic right leg pain and weakness in her, likely of neuropathic origin due to severe spinal arthritis causing nerve compression, improves with muscle relaxers. We will refer her to a specialist for further evaluation, continue muscle relaxers as prescribed, prescribe cortisone cream for itching, and advise the use of compression socks for swelling.  Congestive Heart Failure   She shows signs of congestive heart failure exacerbation, indicated by elevated blood test results and leg swelling, yet reports no dyspnea. Symptoms improve with an increased Lasix dose. We will order a chest x-ray to evaluate heart function, repeat blood tests to monitor levels, continue the higher dose of Lasix, and contact Dr. Excell Seltzer for a potential earlier follow-up.  Chronic Diarrhea   She has experienced chronic diarrhea for several months, possibly related to recent antibiotic use, with symptoms including frequent bowel movements and malaise, somewhat controlled by  Imodium. We will prescribe antidiarrheal medication, advise continued use of Imodium as needed, and refer her to a specialist if symptoms persist.  Chronic Itching   She reports generalized itching with intermittent rash, previously managed with baths and lotion, with symptoms recurring after discontinuation of home health services. We will prescribe cortisone cream for itching and advise the resumption of baths and lotion as previously prescribed.  Follow-up   We will arrange for lab work, a chest x-ray, await a specialist call for leg pain evaluation, and follow up with Dr. Excell Seltzer, potentially sooner than January.        No follow-ups on file.    Donato Schultz, DO

## 2023-04-03 ENCOUNTER — Telehealth: Payer: Self-pay | Admitting: *Deleted

## 2023-04-03 NOTE — Progress Notes (Signed)
Complex Care Management Note Care Guide Note  04/03/2023 Name: TRISTINE FUJITANI MRN: [id number] DOB: 1932-09-06   Complex Care Management Outreach Attempts: An unsuccessful telephone outreach was attempted today to offer the patient information about available complex care management services.  Follow Up Plan:  Additional outreach attempts will be made to offer the patient complex care management information and services.   Encounter Outcome:  No Answer  Burman Nieves, CCMA Care Coordination Care Guide Direct Dial: [phone]

## 2023-04-06 ENCOUNTER — Other Ambulatory Visit: Payer: Medicare Other

## 2023-04-06 DIAGNOSIS — R197 Diarrhea, unspecified: Secondary | ICD-10-CM | POA: Diagnosis not present

## 2023-04-10 NOTE — Progress Notes (Signed)
Complex Care Management Note  Care Guide Note 04/10/2023 Name: KENNICE TRIVETTE MRN: [id number] DOB: 1933-02-25  [name] Fornes is a 89 y.o. year old female who sees [name], Grayling Congress, DO for primary care. I reached out to [employer] by phone today to offer complex care management services.  Ms. Morreale was given information about Complex Care Management services today including:   The Complex Care Management services include support from the care team which includes your Nurse Coordinator, Clinical Social Worker, or Pharmacist.  The Complex Care Management team is here to help remove barriers to the health concerns and goals most important to you. Complex Care Management services are voluntary, and the patient may decline or stop services at any time by request to their care team member.   Complex Care Management Consent Status: Patient agreed to services and verbal consent obtained.   Follow up plan:  Telephone appointment with complex care management team member scheduled for:  04/19/2022  Encounter Outcome:  Patient Scheduled  Burman Nieves, [facility] Care Coordination Care Guide Direct Dial: [phone]

## 2023-04-11 LAB — CDIFF NAA+O+P+STOOL CULTURE
Toxigenic C. Difficile by PCR: NEGATIVE
Toxigenic C. Difficile by PCR: NEGATIVE
Toxigenic C. Difficile by PCR: NEGATIVE

## 2023-04-13 NOTE — Progress Notes (Signed)
 I, Leotis Batter, CMA acting as a scribe for Artist Lloyd, MD.  [name] is a 89 y.o. female who presents to [facility] at [facility] today for R leg pain x 5 weeks. Pt locates pain to R LE, around the knee. Pain causing difficulty with ambulation. Sx come and go, some days better than others. Described as achy. Some relief with muscle relaxer. Some fluid retention.   She recently completed a course of home health physical therapy to focus most on lower extremity strength.  Swelling: yes Radiating: knee to hip, groin Aggravates: waxes and wanes Treatments tried: muscle relaxer's, Tylenol  ES  Dx imaging: 03/31/23 R knee XR  Pertinent review of systems: No fevers or chills  Relevant historical information: Hypertension.  Diabetes.  Heart failure.   Exam:  BP (!) 150/62   Pulse 60   Ht 5' 6 (1.676 m)   Wt 186 lb (84.4 kg)   SpO2 100%   BMI 30.02 kg/m  General: Elderly appearing woman in no acute distress using a walker to ambulate.  MSK: Right hip normal-appearing Tender palpation greater trochanter.  Hip range of motion is limited with limited flexion and rotation.  Range of motion of the hip is not particularly painful.  Right knee mild effusion normal motion with crepitation nontender to palpation.  Hip abduction strength is reduced.    Lab and Radiology Results  Procedure: Real-time Ultrasound Guided Injection of right lateral hip greater trochanter bursa Device: Philips Affiniti 50G/GE Logiq Images permanently stored and available for review in PACS Verbal informed consent obtained.  Discussed risks and benefits of procedure. Warned about infection, bleeding, hyperglycemia damage to structures among others. Patient expresses understanding and agreement Time-out conducted.   Noted no overlying erythema, induration, or other signs of local infection.   Skin prepped in a sterile fashion.   Local anesthesia: Topical Ethyl chloride.   With  sterile technique and under real time ultrasound guidance: 40 mg of Kenalog  and 2 mL of Marcaine  injected into lateral hip greater trochanter bursa. Fluid seen entering the trochanter bursa.   Completed without difficulty   Pain immediately resolved suggesting accurate placement of the medication.   Advised to call if fevers/chills, erythema, induration, drainage, or persistent bleeding.   Images permanently stored and available for review in the ultrasound unit.  Impression: Technically successful ultrasound guided injection.    X-ray images right knee obtained on March 31, 2023 personally and independently interpreted today. Moderate tricompartmental DJD.  No acute fractures are visible. Await formal radiology review.   CT scan of the pelvis obtained Sep 01, 2022 personally independently interpreted today. Mild right hip DJD.  Assessment and Plan: 89 y.o. female with right leg pain multifactorial.  Right anterior hip pain due to of the mild DJD.  Right lateral hip pain due to trochanteric bursitis and right knee pain thought to be due to DJD.  Dominant pain today is the lateral hip pain due to trochanteric bursitis.  Plan for steroid injection today.  She recently completed a course of physical therapy so more is probably not can to be more helpful at least right now.  We may focus the goal of physical therapy more to the hip or knee pain rather than gait in the future.  Recheck in 1 month.  Chronic problem acute recurrence PDMP not reviewed this encounter. Orders Placed This Encounter  Procedures   US  LIMITED JOINT SPACE STRUCTURES LOW RIGHT(NO LINKED CHARGES)    Reason for Exam (  SYMPTOM  OR DIAGNOSIS REQUIRED):   right LE pain    Preferred imaging location?:   [facility]   No orders of the defined types were placed in this encounter.    Discussed warning signs or symptoms. Please see discharge instructions. Patient expresses understanding.   The  above documentation has been reviewed and is accurate and complete Artist Lloyd, M.D.

## 2023-04-14 ENCOUNTER — Ambulatory Visit: Payer: Medicare Other | Admitting: Family Medicine

## 2023-04-14 ENCOUNTER — Other Ambulatory Visit: Payer: Self-pay

## 2023-04-14 ENCOUNTER — Encounter: Payer: Self-pay | Admitting: Family Medicine

## 2023-04-14 VITALS — BP 150/62 | HR 60 | Ht 66.0 in | Wt 186.0 lb

## 2023-04-14 DIAGNOSIS — M79604 Pain in right leg: Secondary | ICD-10-CM

## 2023-04-14 DIAGNOSIS — M7061 Trochanteric bursitis, right hip: Secondary | ICD-10-CM | POA: Diagnosis not present

## 2023-04-14 NOTE — Patient Instructions (Signed)
 Thank you for coming in today.   You received an injection today. Seek immediate medical attention if the joint becomes red, extremely painful, or is oozing fluid.

## 2023-04-17 ENCOUNTER — Encounter: Payer: Self-pay | Admitting: Family Medicine

## 2023-04-17 ENCOUNTER — Ambulatory Visit (INDEPENDENT_AMBULATORY_CARE_PROVIDER_SITE_OTHER): Payer: Medicare Other | Admitting: Family Medicine

## 2023-04-17 VITALS — BP 134/75 | HR 63 | Temp 97.6°F | Ht 66.0 in | Wt 192.4 lb

## 2023-04-17 DIAGNOSIS — I5032 Chronic diastolic (congestive) heart failure: Secondary | ICD-10-CM | POA: Diagnosis not present

## 2023-04-17 DIAGNOSIS — E785 Hyperlipidemia, unspecified: Secondary | ICD-10-CM

## 2023-04-17 DIAGNOSIS — I10 Essential (primary) hypertension: Secondary | ICD-10-CM

## 2023-04-17 DIAGNOSIS — E11620 Type 2 diabetes mellitus with diabetic dermatitis: Secondary | ICD-10-CM | POA: Diagnosis not present

## 2023-04-17 DIAGNOSIS — M109 Gout, unspecified: Secondary | ICD-10-CM

## 2023-04-17 DIAGNOSIS — R0989 Other specified symptoms and signs involving the circulatory and respiratory systems: Secondary | ICD-10-CM | POA: Diagnosis not present

## 2023-04-17 DIAGNOSIS — N1832 Chronic kidney disease, stage 3b: Secondary | ICD-10-CM

## 2023-04-17 LAB — CBC WITH DIFFERENTIAL/PLATELET
Basophils Absolute: 0 10*3/uL (ref 0.0–0.1)
Basophils Relative: 0.1 % (ref 0.0–3.0)
Eosinophils Absolute: 0 10*3/uL (ref 0.0–0.7)
Eosinophils Relative: 0 % (ref 0.0–5.0)
HCT: 35.8 % — ABNORMAL LOW (ref 36.0–46.0)
Hemoglobin: 11.9 g/dL — ABNORMAL LOW (ref 12.0–15.0)
Lymphocytes Relative: 9.9 % — ABNORMAL LOW (ref 12.0–46.0)
Lymphs Abs: 0.9 10*3/uL (ref 0.7–4.0)
MCHC: 33.2 g/dL (ref 30.0–36.0)
MCV: 98.4 fL (ref 78.0–100.0)
Monocytes Absolute: 0.7 10*3/uL (ref 0.1–1.0)
Monocytes Relative: 7.6 % (ref 3.0–12.0)
Neutro Abs: 7.8 10*3/uL — ABNORMAL HIGH (ref 1.4–7.7)
Neutrophils Relative %: 82.4 % — ABNORMAL HIGH (ref 43.0–77.0)
Platelets: 209 10*3/uL (ref 150.0–400.0)
RBC: 3.64 Mil/uL — ABNORMAL LOW (ref 3.87–5.11)
RDW: 13 % (ref 11.5–15.5)
WBC: 9.5 10*3/uL (ref 4.0–10.5)

## 2023-04-17 LAB — COMPREHENSIVE METABOLIC PANEL
ALT: 21 U/L (ref 0–35)
AST: 18 U/L (ref 0–37)
Albumin: 3.9 g/dL (ref 3.5–5.2)
Alkaline Phosphatase: 118 U/L — ABNORMAL HIGH (ref 39–117)
BUN: 42 mg/dL — ABNORMAL HIGH (ref 6–23)
CO2: 27 meq/L (ref 19–32)
Calcium: 9.1 mg/dL (ref 8.4–10.5)
Chloride: 103 meq/L (ref 96–112)
Creatinine, Ser: 1.37 mg/dL — ABNORMAL HIGH (ref 0.40–1.20)
GFR: 34.48 mL/min — ABNORMAL LOW (ref >60.00)
Glucose, Bld: 195 mg/dL — ABNORMAL HIGH (ref 70–99)
Potassium: 4.4 meq/L (ref 3.5–5.1)
Sodium: 140 meq/L (ref 135–145)
Total Bilirubin: 0.6 mg/dL (ref 0.2–1.2)
Total Protein: 6.6 g/dL (ref 6.0–8.3)

## 2023-04-17 LAB — LIPID PANEL
Cholesterol: 145 mg/dL (ref 0–200)
HDL: 69.4 mg/dL (ref >39.00)
LDL Cholesterol: 58 mg/dL (ref 0–99)
NonHDL: 75.21
Total CHOL/HDL Ratio: 2
Triglycerides: 86 mg/dL (ref 0.0–149.0)
VLDL: 17.2 mg/dL (ref 0.0–40.0)

## 2023-04-17 LAB — HEMOGLOBIN A1C: Hgb A1c MFr Bld: 7 % — ABNORMAL HIGH (ref 4.6–6.5)

## 2023-04-17 LAB — URIC ACID: Uric Acid, Serum: 7.3 mg/dL — ABNORMAL HIGH (ref 2.4–7.0)

## 2023-04-17 MED ORDER — ALLOPURINOL 100 MG PO TABS: 100.0000 mg | ORAL_TABLET | Freq: Every day | ORAL | 3 refills | Status: AC

## 2023-04-17 NOTE — Assessment & Plan Note (Signed)
 Well controlled, no changes to meds. Encouraged heart healthy diet such as the DASH diet and exercise as tolerated.

## 2023-04-17 NOTE — Patient Instructions (Signed)
Edema  Edema is an abnormal buildup of fluids in the body tissues and under the skin. Swelling of the legs, feet, and ankles is a common symptom that becomes more likely as you get older. Swelling is also common in looser tissues, such as around the eyes. Pressing on the area may make a temporary dent in your skin (pitting edema). This fluid may also accumulate in your lungs (pulmonary edema). There are many possible causes of edema. Eating too much salt (sodium) and being on your feet or sitting for a long time can cause edema in your legs, feet, and ankles. Common causes of edema include: Certain medical conditions, such as heart failure, liver or kidney disease, and cancer. Weak leg blood vessels. An injury. Pregnancy. Medicines. Being obese. Low protein levels in the blood. Hot weather may make edema worse. Edema is usually painless. Your skin may look swollen or shiny. Follow these instructions at home: Medicines Take over-the-counter and prescription medicines only as told by your health care provider. Your health care provider may prescribe a medicine to help your body get rid of extra water (diuretic). Take this medicine if you are told to take it. Eating and drinking Eat a low-salt (low-sodium) diet to reduce fluid as told by your health care provider. Sometimes, eating less salt may reduce swelling. Depending on the cause of your swelling, you may need to limit how much fluid you drink (fluid restriction). General instructions Raise (elevate) the injured area above the level of your heart while you are sitting or lying down. Do not sit still or stand for long periods of time. Do not wear tight clothing. Do not wear garters on your upper legs. Exercise your legs to get your circulation going. This helps to move the fluid back into your blood vessels, and it may help the swelling go down. Wear compression stockings as told by your health care provider. These stockings help to prevent  blood clots and reduce swelling in your legs. It is important that these are the correct size. These stockings should be prescribed by your health care provider to prevent possible injuries. If elastic bandages or wraps are recommended, use them as told by your health care provider. Contact a health care provider if: Your edema does not get better with treatment. You have heart, liver, or kidney disease and have symptoms of edema. You have sudden and unexplained weight gain. Get help right away if: You develop shortness of breath or chest pain. You cannot breathe when you lie down. You develop pain, redness, or warmth in the swollen areas. You have heart, liver, or kidney disease and suddenly get edema. You have a fever and your symptoms suddenly get worse. These symptoms may be an emergency. Get help right away. Call 911. Do not wait to see if the symptoms will go away. Do not drive yourself to the hospital. Summary Edema is an abnormal buildup of fluids in the body tissues and under the skin. Eating too much salt (sodium)and being on your feet or sitting for a long time can cause edema in your legs, feet, and ankles. Raise (elevate) the injured area above the level of your heart while you are sitting or lying down. Follow your health care provider's instructions about diet and how much fluid you can drink. This information is not intended to replace advice given to you by your health care provider. Make sure you discuss any questions you have with your health care provider. Document Revised: 11/30/2020 Document  Reviewed: 11/30/2020 Elsevier Patient Education  2024 [employer].

## 2023-04-17 NOTE — Assessment & Plan Note (Signed)
 Encourage heart healthy diet such as MIND or DASH diet, increase exercise, avoid trans fats, simple carbohydrates and processed foods, consider a krill or fish or flaxseed oil cap daily.

## 2023-04-17 NOTE — Progress Notes (Signed)
 Established Patient Office Visit  Subjective   Patient ID: [name], female    DOB: [birth year]  Age: 89 y.o. MRN: [id number]  Chief Complaint  Patient presents with   Medical Management of Chronic Issues    Patient states things are well. No concerns     HPI The patient, with a history of hip pain, swelling in the legs, and breathing difficulties, reports an improvement in hip pain due to medication. She notes that the swelling in her legs is worse later in the day, and she has not yet obtained compression socks as previously prescribed due to transportation issues.  The patient also describes a new, unusual sensation of breathing through the stomach, which started the day before the consultation. This sensation is not associated with any pain, but is described as a jerking feeling. It is not related to the patient's position and can occur while sitting or lying down.  The patient also reports a history of gout, primarily affecting the left big toe, and has been feeling some pain in that area recently. She has been taking allopurinol  for this condition.  The patient has a history of back surgery and has been diagnosed with arthritis and stenosis in the lower back. She recently received a steroid shot for bursitis in the hip, which has significantly improved her pain.  The patient also has a history of macular degeneration and is due for a treatment later in the month. She has been experiencing diarrhea, which is improving but has not completely resolved. Discussed the use of AI scribe software for clinical note transcription with the patient, who gave verbal consent to proceed.  History of Present Illness   The patient, with a history of hip pain, swelling in the legs, and breathing difficulties, reports an improvement in hip pain due to medication. She notes that the swelling in her legs is worse later in the day, and she has not yet obtained compression socks as previously  prescribed due to transportation issues.  The patient also describes a new, unusual sensation of breathing through the stomach, which started the day before the consultation. This sensation is not associated with any pain, but is described as a jerking feeling. It is not related to the patient's position and can occur while sitting or lying down.  The patient also reports a history of gout, primarily affecting the left big toe, and has been feeling some pain in that area recently. She has been taking allopurinol  for this condition.  The patient has a history of back surgery and has been diagnosed with arthritis and stenosis in the lower back. She recently received a steroid shot for bursitis in the hip, which has significantly improved her pain.  The patient also has a history of macular degeneration and is due for a treatment later in the month. She has been experiencing diarrhea, which is improving but has not completely resolved.          Patient Active Problem List   Diagnosis Date Noted   Dermatitis associated with moisture 03/03/2023   Open wound of skin 03/03/2023   Acute pain of left hip 09/01/2022   Need for pneumococcal 20-valent conjugate vaccination 08/22/2022   Rash 05/27/2022   Tremor 05/27/2022   Coronary artery disease involving native coronary artery of native heart without angina pectoris 09/14/2021   Pain due to onychomycosis of toenails of both feet 01/15/2021   Type 2 diabetes mellitus with diabetic dermatitis, without long-term current use of  insulin  (HCC) 01/15/2021   Palpitations 01/15/2018   Cardiac pacemaker in situ 01/15/2018   Heel spur, left 09/13/2017   Left knee pain 08/23/2017   CKD (chronic kidney disease), stage III (HCC) 12/04/2016   Chest pain 11/23/2015   Type I (juvenile type) diabetes mellitus with renal manifestations, not stated as uncontrolled(250.41) 04/27/2012   Insulin  dependent diabetes mellitus with complications 04/27/2012   AKI  (acute kidney injury) (HCC) 03/05/2012   Diarrhea 03/05/2012   Carotid artery disease (HCC) 08/11/2010   Left shoulder pain 07/08/2010   Preventative health care 07/08/2010   HOARSENESS 06/04/2010   Pruritus 05/11/2010   ANEMIA-NOS 04/02/2010   (HFpEF) heart failure with preserved ejection fraction (HCC) 02/11/2010   SINUS BRADYCARDIA 10/30/2009   Allergic rhinitis 09/11/2009   Backache 09/11/2009   MUSCLE STRAIN, RIGHT BUTTOCK 09/11/2009   Herpes zoster 05/11/2009   SHOULDER PAIN, LEFT 12/29/2008   Proteinuria 12/12/2008   Abdominal pain 09/24/2007   Hx of CABG 05/03/2007   CHEST PAIN 04/20/2007   Dizziness 02/28/2007   Anxiety state 02/15/2007   GERD 02/15/2007   Gastroparesis 02/15/2007   DISC DISEASE, CERVICAL 02/15/2007   DISC DISEASE, LUMBAR 02/15/2007   SPINAL STENOSIS, LUMBAR 02/15/2007   PERIPHERAL EDEMA 02/15/2007   Personal History of Other Diseases of Digestive Disease 02/15/2007   Hyperlipidemia LDL goal <70 01/01/2007   GOUT 01/01/2007   Morbid obesity (HCC) 01/01/2007   DEPRESSION 01/01/2007   PERIPHERAL VASCULAR DISEASE 01/01/2007   Diverticulosis of colon 01/01/2007   Osteoarthritis 01/01/2007   LOW BACK PAIN 01/01/2007   Disorder of bone and cartilage 01/01/2007   Essential hypertension 10/26/2006   Past Medical History:  Diagnosis Date   Allergic rhinitis    Anemia    Anxiety    Barrett esophagus    CAD (coronary artery disease) 2009   a. Multivessel s/p PCI w/DES 2009 // b. s/p CABG 2011  //  c. LHC 8/15: pLAD 95 ISR, LCx 100, pOM1 40, dRCA 100, S-OM1/OM2 ok, S-D1 ok, S-PDA ok, L-LAD ok, EF 60%   Carotid artery disease (HCC)    a. Carotid US  9/15: RICA 1-39%; LICA 40-59% >> FU 1 year  //  b. Carotid US  9/17: R 1-39%, L 40-59% >> FU 1 year   Chronic diastolic heart failure (HCC)    CKD (chronic kidney disease), stage II    GFR 60-89 ml/min   Depression    Disc disease, degenerative, cervical    Diverticulosis    Gastroparesis    GERD  (gastroesophageal reflux disease)    Gout    H/O hiatal hernia    Helicobacter pylori gastritis    History of echocardiogram    a. Echo 11/13: EF 55% to 60%. Grade 2 diastolic dysfunction, MAC, trivial MR, mild LAE, normal RVSF, mild RAE, PASP 39 mmHg  //  b. 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   Type II or unspecified type diabetes mellitus without mention of complication, not stated as  uncontrolled    Past Surgical History:  Procedure Laterality Date   ABDOMINAL HYSTERECTOMY     CARDIAC CATHETERIZATION     2011  DR COOPER (APPT NEXT WEEK)   CHOLECYSTECTOMY     CORONARY ARTERY BYPASS GRAFT  2011   LIMA-LAD, SVG-DIAG, SVG-OM1-OM2, SVG-PDA   CORONARY STENT PLACEMENT     Drug-eluting stent to the left anterior descending, circumflex and right coronary artery in Jan 2009   EYE SURGERY     BIL CATARACT REMOVAL 06/2010   LEFT HEART CATHETERIZATION WITH CORONARY ANGIOGRAM N/A 12/02/2013   Procedure: LEFT HEART CATHETERIZATION WITH CORONARY ANGIOGRAM;  Surgeon: Victory LELON Claudene DOUGLAS, MD;  Location: [facility] CATH LAB;  Service: Cardiovascular;  Laterality: N/A;   OVARIAN CYST REMOVAL     PACEMAKER INSERTION  03/06/12   MDT Adapta L implanted by Dr Kelsie for SSS   PERMANENT PACEMAKER INSERTION N/A 03/06/2012   Procedure: PERMANENT PACEMAKER INSERTION;  Surgeon: Lynwood Kelsie, MD;  Location: [facility] CATH LAB;  Service: Cardiovascular;  Laterality: N/A;   SHOULDER ARTHROSCOPY  06/16/2011   Procedure: ARTHROSCOPY SHOULDER;  Surgeon: Rome JULIANNA Pepper, MD;  Location: [facility] OR;   Service: Orthopedics;  Laterality: Left;  LEFT SHOULDER ARTHROSCOPY ACROMIALPLASTY, POSSIBLE MINI OPEN CUFF REPAIR    TUBAL LIGATION     Social History   Tobacco Use   Smoking status: Former    Current packs/day: 0.00    Average packs/day: 0.3 packs/day for 28.0 years (7.0 ttl pk-yrs)    Types: Cigarettes    Start date: 39    Quit date: 82    Years since quitting: 31.0   Smokeless tobacco: Never   Tobacco comments:    quit 30 yrs ago  Vaping Use   Vaping status: Never Used  Substance Use Topics   Alcohol use: No   Drug use: No   Social History   Socioeconomic History   Marital status: Widowed    Spouse name: Not on file   Number of children: Not on file   Years of education: Not on file   Highest education level: Not on file  Occupational History   Occupation: RETIRED LPN    Comment: worked as [employer]  Tobacco Use   Smoking status: Former    Current packs/day: 0.00    Average packs/day: 0.3 packs/day for 28.0 years (7.0 ttl pk-yrs)    Types: Cigarettes    Start date: 89    Quit date: 51    Years since quitting: 31.0   Smokeless tobacco: Never   Tobacco comments:    quit 30 yrs ago  Vaping Use   Vaping status: Never Used  Substance and Sexual Activity   Alcohol use: No   Drug use: No   Sexual activity: Not on file  Other Topics Concern   Not on file  Social History Narrative   Widowed 2004.., Lives with daughter and grand son ,Family history is negative for premature coronary artery disease. Mother died at age 41 with heart disease in her later years, father died at age 38 from a stroke.SABRAShe  has 8 siblings, none of whom have coronary artery disease.   Social Drivers of Corporate Investment Banker Strain: Low Risk  (11/02/2022)   Overall Financial Resource Strain (CARDIA)    Difficulty of Paying Living Expenses: Not hard at all  Food Insecurity: No Food Insecurity (11/02/2022)   Hunger Vital Sign    Worried About Running Out of Food in the Last Year:  Never true  Ran Out of Food in the Last Year: Never true  Transportation Needs: No Transportation Needs (11/02/2022)   PRAPARE - Administrator, Civil Service (Medical): No    Lack of Transportation (Non-Medical): No  Physical Activity: Insufficiently Active (11/02/2022)   Exercise Vital Sign    Days of Exercise per Week: 5 days    Minutes of Exercise per Session: 20 min  Stress: No Stress Concern Present (11/02/2022)   [employer] of Occupational Health - Occupational Stress Questionnaire    Feeling of Stress : Only a little  Social Connections: Moderately Isolated (11/02/2022)   Social Connection and Isolation Panel [NHANES]    Frequency of Communication with Friends and Family: More than three times a week    Frequency of Social Gatherings with Friends and Family: Once a week    Attends Religious Services: More than 4 times per year    Active Member of [employer] or Organizations: No    Attends Banker Meetings: Never    Marital Status: Widowed  Intimate Partner Violence: Not At Risk (11/02/2022)   Humiliation, Afraid, Rape, and Kick questionnaire    Fear of Current or Ex-Partner: No    Emotionally Abused: No    Physically Abused: No    Sexually Abused: No   Family Status  Relation Name Status   Mother  Deceased       Old Age   Father  Deceased   Sister  Deceased   Sister  Deceased   Brother  Alive   Brother  Alive   Brother  Deceased   Brother  Deceased   Brother  Deceased   Brother  Deceased   MGM  Deceased   MGF  Deceased   PGM  Deceased   PGF  Deceased   Daughter  Corporate Treasurer  Alive   Daughter  Alive   Son  Alive   Son  Deceased   Other  (Not Specified)  No partnership data on file   Family History  Problem Relation Age of Onset   Diabetes Mother    Hypertension Mother    Heart attack Mother    Stroke Father    Cancer Brother    Deafness Daughter    Other Son        cerebral edema at 18 months   Coronary artery disease  Other    Allergies  Allergen Reactions   Ciprofloxacin  Nausea And Vomiting and Other (See Comments)    Syncope, also    Codeine Other (See Comments)    HALLUCINATIONS   Hydrocodone Other (See Comments)    Made her pass out   Penicillins Hives   Shellfish Allergy Hives   Diltiazem Hcl Other (See Comments)    Low heart rate   Sulfonamide Derivatives Nausea And Vomiting   Morphine  And Codeine Other (See Comments)    Went crazy   Lovastatin    Metformin  Diarrhea      Review of Systems  Constitutional:  Negative for fever and malaise/fatigue.  HENT:  Negative for congestion.   Eyes:  Negative for blurred vision.  Respiratory:  Negative for cough and shortness of breath.   Cardiovascular:  Positive for leg swelling. Negative for chest pain and palpitations.  Gastrointestinal:  Negative for abdominal pain, blood in stool, nausea and vomiting.  Genitourinary:  Negative for dysuria and frequency.  Musculoskeletal:  Negative for back pain and falls.  Skin:  Negative for rash.  Neurological:  Negative for dizziness,  loss of consciousness and headaches.  Endo/Heme/Allergies:  Negative for environmental allergies.  Psychiatric/Behavioral:  Negative for depression. The patient is not nervous/anxious.       Objective:     BP 134/75   Pulse 63   Temp 97.6 F (36.4 C) (Oral)   Ht 5' 6 (1.676 m)   Wt 192 lb 6 oz (87.3 kg)   SpO2 97%   BMI 31.05 kg/m  BP Readings from Last 3 Encounters:  04/17/23 134/75  04/14/23 (!) 150/62  03/31/23 (!) 140/88   Wt Readings from Last 3 Encounters:  04/17/23 192 lb 6 oz (87.3 kg)  04/14/23 186 lb (84.4 kg)  03/03/23 214 lb 14.4 oz (97.5 kg)   SpO2 Readings from Last 3 Encounters:  04/17/23 97%  04/14/23 100%  03/31/23 98%      Physical Exam Vitals and nursing note reviewed.  Constitutional:      General: She is not in acute distress.    Appearance: Normal appearance. She is well-developed.  HENT:     Head: Normocephalic and  atraumatic.  Eyes:     General: No scleral icterus.       Right eye: No discharge.        Left eye: No discharge.  Cardiovascular:     Rate and Rhythm: Normal rate and regular rhythm.     Heart sounds: No murmur heard. Pulmonary:     Effort: Pulmonary effort is normal. No respiratory distress.     Breath sounds: Normal breath sounds.  Musculoskeletal:        General: Normal range of motion.     Cervical back: Normal range of motion and neck supple.     Right lower leg: No edema.     Left lower leg: No edema.  Skin:    General: Skin is warm and dry.  Neurological:     Mental Status: She is alert and oriented to person, place, and time.  Psychiatric:        Mood and Affect: Mood normal.        Behavior: Behavior normal.        Thought Content: Thought content normal.        Judgment: Judgment normal.      No results found for any visits on 04/17/23.  Last CBC Lab Results  Component Value Date   WBC 8.8 03/27/2023   HGB 11.5 (L) 03/27/2023   HCT 35.1 (L) 03/27/2023   MCV 99.7 03/27/2023   MCH 31.7 09/02/2022   RDW 14.1 03/27/2023   PLT 190.0 03/27/2023   Last metabolic panel Lab Results  Component Value Date   GLUCOSE 141 (H) 03/31/2023   NA 142 03/31/2023   K 4.2 03/31/2023   CL 104 03/31/2023   CO2 30 03/31/2023   BUN 42 (H) 03/31/2023   CREATININE 1.58 (H) 03/31/2023   GFR 29.07 (L) 03/31/2023   CALCIUM  8.5 03/31/2023   PHOS 3.0 03/06/2012   PROT 6.2 03/31/2023   ALBUMIN 3.7 03/31/2023   BILITOT 0.6 03/31/2023   ALKPHOS 121 (H) 03/31/2023   AST 22 03/31/2023   ALT 19 03/31/2023   ANIONGAP 9 09/02/2022   Last lipids Lab Results  Component Value Date   CHOL 121 10/14/2022   HDL 58.80 10/14/2022   LDLCALC 32 10/14/2022   LDLDIRECT 82.5 09/11/2009   TRIG 152.0 (H) 10/14/2022   CHOLHDL 2 10/14/2022   Last hemoglobin A1c Lab Results  Component Value Date   HGBA1C 7.2 (H) 10/14/2022  Last thyroid  functions Lab Results  Component Value Date    TSH 4.78 03/31/2022   Last vitamin D  Lab Results  Component Value Date   VD25OH 41.98 03/31/2022   Last vitamin B12 and Folate Lab Results  Component Value Date   VITAMINB12 318 03/31/2022      The ASCVD Risk score (Arnett DK, et al., 2019) failed to calculate for the following reasons:   The 2019 ASCVD risk score is only valid for ages 77 to 34    Assessment & Plan:   Problem List Items Addressed This Visit       Unprioritized   Type 2 diabetes mellitus with diabetic dermatitis, without long-term current use of insulin  (HCC) - Primary   Relevant Orders   Comprehensive metabolic panel   Hemoglobin A1c   Microalbumin / creatinine urine ratio   CKD (chronic kidney disease), stage III (HCC) (Chronic)   (HFpEF) heart failure with preserved ejection fraction (HCC) (Chronic)   Hyperlipidemia LDL goal <70 (Chronic)   Encourage heart healthy diet such as MIND or DASH diet, increase exercise, avoid trans fats, simple carbohydrates and processed foods, consider a krill or fish or flaxseed oil cap daily.        Relevant Orders   Lipid panel   Comprehensive metabolic panel   Essential hypertension (Chronic)   Well controlled, no changes to meds. Encouraged heart healthy diet such as the DASH diet and exercise as tolerated.        Relevant Orders   CBC with Differential/Platelet   Comprehensive metabolic panel   Microalbumin / creatinine urine ratio   Other Visit Diagnoses       Abdominal aortic pulsation       Relevant Orders   US  Abdomen Complete     Gouty arthritis of left great toe       Relevant Medications   allopurinol  (ZYLOPRIM ) 100 MG tablet   Other Relevant Orders   Uric acid      Assessment and Plan    Hip Pain due to Bursitis   Following a steroid injection three days ago, there has been significant improvement in her hip pain, which has decreased substantially. She has been informed about the potential recurrence and the need for follow-up if symptoms  persist. We will monitor her hip pain and the effectiveness of the steroid injection and she will follow up with Dr. Joane in one month.  Arthritis and Spinal Stenosis   Her arthritis and spinal stenosis, likely exacerbated by previous back surgery, have shown significant relief following a steroid injection. The chronic nature of these conditions and the need for ongoing management have been discussed. We will monitor her symptoms and the effectiveness of the steroid injection, with a follow-up appointment with Dr. Joane in one month.  Lower Extremity Edema   She experiences morning leg swelling and has not yet obtained the prescribed compression socks. The benefits of compression therapy and the potential improvement with consistent use have been discussed. We recommend obtaining compression socks or Velcro wraps and consider Ace wraps for both legs if compression socks are not feasible.  Dyspnea   She experiences intermittent shortness of breath while sitting and lying down, with the etiology unclear and no recent illness reported. The differential includes possible aortic aneurysm or other vascular issues, and the need for further evaluation has been discussed. We will check the aorta for dilation to rule out an aneurysm.  Diarrhea   Her diarrhea is improving but not completely resolved,  with a normal stool culture. The importance of monitoring symptoms and seeking further evaluation if symptoms persist has been discussed. We will continue monitoring symptoms.  Gout   She has gout primarily in the left big toe with recent pain and requires an allopurinol  refill. The importance of medication adherence to prevent flares has been discussed. We will refill allopurinol  with a 90-day supply.  Macular Degeneration   She is under the care of Dr. Jarold, a retina specialist, with an upcoming appointment later this month. The importance of regular follow-up has been discussed. She will continue  follow-up with Dr. Jarold for macular degeneration treatment.  General Health Maintenance   She needs an A1c check and has an upcoming eye appointment. The importance of regular A1c monitoring for diabetes management has been discussed. We will order blood work to check A1c and confirm the upcoming eye appointment with Dr. Jarold.  Follow-up   She will follow up with Dr. Joane in one month and attend the eye appointment with Dr. Jarold later this month.       Return in about 3 months (around 07/16/2023).    Samantha Kawahara R Lowne Chase, [name]

## 2023-04-18 LAB — MICROALBUMIN / CREATININE URINE RATIO
Creatinine,U: 66 mg/dL
Microalb Creat Ratio: 94.5 mg/g — ABNORMAL HIGH (ref 0.0–30.0)
Microalb, Ur: 62.3 mg/dL — ABNORMAL HIGH (ref 0.0–1.9)

## 2023-04-20 ENCOUNTER — Telehealth: Payer: Self-pay

## 2023-04-20 ENCOUNTER — Ambulatory Visit: Payer: Self-pay

## 2023-04-20 ENCOUNTER — Ambulatory Visit: Payer: Self-pay | Admitting: Licensed Clinical Social Worker

## 2023-04-20 ENCOUNTER — Other Ambulatory Visit: Payer: Self-pay | Admitting: Family Medicine

## 2023-04-20 DIAGNOSIS — I130 Hypertensive heart and chronic kidney disease with heart failure and stage 1 through stage 4 chronic kidney disease, or unspecified chronic kidney disease: Secondary | ICD-10-CM | POA: Diagnosis not present

## 2023-04-20 DIAGNOSIS — N183 Chronic kidney disease, stage 3 unspecified: Secondary | ICD-10-CM | POA: Diagnosis not present

## 2023-04-20 DIAGNOSIS — M48061 Spinal stenosis, lumbar region without neurogenic claudication: Secondary | ICD-10-CM

## 2023-04-20 DIAGNOSIS — E1151 Type 2 diabetes mellitus with diabetic peripheral angiopathy without gangrene: Secondary | ICD-10-CM | POA: Diagnosis not present

## 2023-04-20 DIAGNOSIS — I5032 Chronic diastolic (congestive) heart failure: Secondary | ICD-10-CM | POA: Diagnosis not present

## 2023-04-20 DIAGNOSIS — E11620 Type 2 diabetes mellitus with diabetic dermatitis: Secondary | ICD-10-CM

## 2023-04-20 DIAGNOSIS — D631 Anemia in chronic kidney disease: Secondary | ICD-10-CM | POA: Diagnosis not present

## 2023-04-20 DIAGNOSIS — E1122 Type 2 diabetes mellitus with diabetic chronic kidney disease: Secondary | ICD-10-CM | POA: Diagnosis not present

## 2023-04-20 DIAGNOSIS — N1832 Chronic kidney disease, stage 3b: Secondary | ICD-10-CM

## 2023-04-20 NOTE — Telephone Encounter (Signed)
 Copied from CRM [id number]. Topic: Clinical - Home Health Verbal Orders >> Apr 20, 2023  2:51 PM Annabella DEL wrote: Caller/Agency: [facility]  Callback Number: [phone] Service Requested: Physical Therapy, OT Eval Frequency: 1 Week 4 Any new concerns about the patient? No

## 2023-04-20 NOTE — Telephone Encounter (Signed)
LMOM verbal orders given.  

## 2023-04-20 NOTE — Patient Outreach (Signed)
  Care Coordination   Initial Visit Note   04/20/2023 Name: [name] MRN: [id number] DOB: 04-08-1933  [name] is a 89 y.o. year old female who sees Antonio Meth, [name], DO for primary care. I spoke with  [name] by phone today.  What matters to the patients health and wellness today?  HHA order needed from PCP    Goals Addressed             This Visit's Progress    Care Coordination Activities       Care Coordination Interventions: Patient stated that she wants a HHA in the home to assist with her daily bathes, she stated that she talked and discussed with her PCP but does not know if that order was written and would like the SW to see if it was written by the PCP. SW will in box the PCP to find out the status.  SW will follow up with patient in 2 weeks         SDOH assessments and interventions completed:  Yes  SDOH Interventions Today    Flowsheet Row Most Recent Value  SDOH Interventions   Food Insecurity Interventions Intervention Not Indicated  Housing Interventions Intervention Not Indicated  Transportation Interventions Intervention Not Indicated  [Family takes her all medical appointments]  Utilities Interventions Intervention Not Indicated        Care Coordination Interventions:  Yes, provided  Interventions Today    Flowsheet Row Most Recent Value  General Interventions   General Interventions Discussed/Reviewed General Interventions Discussed, Programmer, Applications, Communication with  [[name] wants a HHA and SW will inbox PCP for an order]        Follow up plan: Follow up call scheduled for 05/04/2023 at 1:00 pm    Encounter Outcome:  Patient Visit Completed  Tobias CHARM [name], PhD [facility], [facility] Social Worker Direct Dial: [phone]  Fax: [phone]

## 2023-04-20 NOTE — Patient Outreach (Signed)
  Care Coordination   Follow Up Visit Note   04/20/2023 Name: [name] MRN: [id number] DOB: July 18, 1932  [name] is a 89 y.o. year old female who sees Antonio Meth, [name], DO for primary care. I spoke with  [name] by phone today.  What matters to the patients health and wellness today? [name] reports she saw primary care on 03/31/19/24 and had follow up with orthopedic provider. She reports per orthopedic provider her leg pain was coming from her back. She states steroid injections and muscle relaxants have helped her leg pain and pain level is a zero at this time. [name] has not obtained compression socks recommended by PCP. However, patient states she has prescription. RNCM provided [employer] [phone]; [employer] [phone] and encouraged patient to contact her insurance company regarding OTC products. Patient with no questions or concerns at this time.  Goals Addressed             This Visit's Progress    Assist with health management       Interventions Today    Flowsheet Row Most Recent Value  Chronic Disease   Chronic disease during today's visit Other, Congestive Heart Failure (CHF)  [right leg pain]  General Interventions   General Interventions Discussed/Reviewed General Interventions Reviewed, Communication with  [Evaluation of current treatment plan for health condition and patient's adherence to plan. discussed follow up with PCP office visit and pain status. discussed options for obtaining compression socks.]  Doctor Visits Discussed/Reviewed PCP, Specialist  PCP/Specialist Visits Compliance with follow-up visit  [reviewed follow up appointments with patient]  Communication with Social Work  hollace follow status of compression socks at next telephone call]  Education Interventions   Education Provided Provided Education  [Provided tour manager [employer] [phone],  [employer] [phone]. encouraged patient to reach out to  insurance company re: options for using prescription for compression sock]  Provided Verbal Education On When to see the doctor, Medication, Insurance Plans  [advised to contact provider with health questions or concerns, attend provider visits as scheduled, take medications as prescribed, eat healthy]  Pharmacy Interventions   Pharmacy Dicussed/Reviewed Pharmacy Topics Reviewed  [medications reviewed]  Safety Interventions   Safety Discussed/Reviewed Safety Reviewed             SDOH assessments and interventions completed:  No  Care Coordination Interventions:  Yes, provided   Follow up plan: Follow up call scheduled for 05/22/23    Encounter Outcome:  Patient Visit Completed   Heddy Shutter, RN, MSN, BSN, CCM Care Management Coordinator [phone]

## 2023-04-20 NOTE — Patient Instructions (Addendum)
 Visit Information  Thank you for taking time to visit with me today. Please don't hesitate to contact me if I can be of assistance to you.   Following are the goals we discussed today:  Continue to take medications as prescribed. Continue to attend provider visits as scheduled Continue to eat healthy, lean meats, vegetables, fruits, avoid saturated and transfats Contact provider with health questions or concerns as needed For Compression Socks contact: [facility] [phone]; [employer] [phone]; contact your insurance company regarding Compression stocking and where you can obtain them with your prescription.   Our next appointment is by telephone on 05/22/23 at 11:00 am  Please call the care guide team at [phone] if you need to cancel or reschedule your appointment.   If you are experiencing a Mental Health or Behavioral Health Crisis or need someone to talk to, please call the Suicide and Crisis Lifeline: 988 call the [place]  National Suicide Prevention Lifeline: 361-071-1829 or TTY: (709)059-3608 TTY (782)122-5219) to talk to a trained counselor  Heddy Shutter, RN, MSN, BSN, CCM Care Management Coordinator [phone]

## 2023-04-20 NOTE — Patient Instructions (Signed)
 Visit Information  Thank you for taking time to visit with me today. Please don't hesitate to contact me if I can be of assistance to you.   Following are the goals we discussed today:   Goals Addressed             This Visit's Progress    Care Coordination Activities       Care Coordination Interventions: Patient stated that she wants a HHA in the home to assist with her daily bathes, she stated that she talked and discussed with her PCP but does not know if that order was written and would like the SW to see if it was written by the PCP. SW will in box the PCP to find out the status.  SW will follow up with patient in 2 weeks         Our next appointment is by telephone on 05/04/2023 at 1:00 pm  Please call the care guide team at [phone] if you need to cancel or reschedule your appointment.   If you are experiencing a Mental Health or Behavioral Health Crisis or need someone to talk to, please call the Suicide and Crisis Lifeline: 988 go to [facility] [address] [place] [phone]) call 911  The patient verbalized understanding of instructions, educational materials, and care plan provided today and DECLINED offer to receive copy of patient instructions, educational materials, and care plan.   Samantha CHARM [name], PhD [facility], [facility] Social Worker Direct Dial: [phone]  Fax: [phone]

## 2023-04-20 NOTE — Patient Outreach (Signed)
  Care Coordination   04/20/2023 Name: [name] MRN: [id number] DOB: 1933/02/13   Care Coordination Outreach Attempts:  An unsuccessful outreach was attempted for an appointment today.  Follow Up Plan:  Additional outreach attempts will be made to offer the patient complex care management information and services.   Encounter Outcome:  No Answer   Care Coordination Interventions:  No, not indicated    Heddy Shutter, RN, MSN, BSN, CCM Care Management Coordinator [phone]

## 2023-04-21 ENCOUNTER — Ambulatory Visit (HOSPITAL_BASED_OUTPATIENT_CLINIC_OR_DEPARTMENT_OTHER)
Admission: RE | Admit: 2023-04-21 | Discharge: 2023-04-21 | Disposition: A | Discharge status: Home / Self Care | Payer: Medicare Other | Source: Ambulatory Visit | Attending: Family Medicine | Admitting: Family Medicine

## 2023-04-21 DIAGNOSIS — R932 Abnormal findings on diagnostic imaging of liver and biliary tract: Secondary | ICD-10-CM | POA: Diagnosis not present

## 2023-04-21 DIAGNOSIS — R0989 Other specified symptoms and signs involving the circulatory and respiratory systems: Secondary | ICD-10-CM | POA: Diagnosis not present

## 2023-04-25 DIAGNOSIS — N183 Chronic kidney disease, stage 3 unspecified: Secondary | ICD-10-CM | POA: Diagnosis not present

## 2023-04-25 DIAGNOSIS — I130 Hypertensive heart and chronic kidney disease with heart failure and stage 1 through stage 4 chronic kidney disease, or unspecified chronic kidney disease: Secondary | ICD-10-CM | POA: Diagnosis not present

## 2023-04-25 DIAGNOSIS — E1151 Type 2 diabetes mellitus with diabetic peripheral angiopathy without gangrene: Secondary | ICD-10-CM | POA: Diagnosis not present

## 2023-04-25 DIAGNOSIS — I5032 Chronic diastolic (congestive) heart failure: Secondary | ICD-10-CM | POA: Diagnosis not present

## 2023-04-25 DIAGNOSIS — E1122 Type 2 diabetes mellitus with diabetic chronic kidney disease: Secondary | ICD-10-CM | POA: Diagnosis not present

## 2023-04-25 DIAGNOSIS — D631 Anemia in chronic kidney disease: Secondary | ICD-10-CM | POA: Diagnosis not present

## 2023-04-26 DIAGNOSIS — E1122 Type 2 diabetes mellitus with diabetic chronic kidney disease: Secondary | ICD-10-CM | POA: Diagnosis not present

## 2023-04-26 DIAGNOSIS — E1151 Type 2 diabetes mellitus with diabetic peripheral angiopathy without gangrene: Secondary | ICD-10-CM | POA: Diagnosis not present

## 2023-04-26 DIAGNOSIS — I5032 Chronic diastolic (congestive) heart failure: Secondary | ICD-10-CM | POA: Diagnosis not present

## 2023-04-26 DIAGNOSIS — D631 Anemia in chronic kidney disease: Secondary | ICD-10-CM | POA: Diagnosis not present

## 2023-04-26 DIAGNOSIS — N183 Chronic kidney disease, stage 3 unspecified: Secondary | ICD-10-CM | POA: Diagnosis not present

## 2023-04-26 DIAGNOSIS — I130 Hypertensive heart and chronic kidney disease with heart failure and stage 1 through stage 4 chronic kidney disease, or unspecified chronic kidney disease: Secondary | ICD-10-CM | POA: Diagnosis not present

## 2023-04-28 ENCOUNTER — Other Ambulatory Visit: Payer: Self-pay

## 2023-05-02 ENCOUNTER — Ambulatory Visit (INDEPENDENT_AMBULATORY_CARE_PROVIDER_SITE_OTHER): Payer: Medicare Other

## 2023-05-02 DIAGNOSIS — I130 Hypertensive heart and chronic kidney disease with heart failure and stage 1 through stage 4 chronic kidney disease, or unspecified chronic kidney disease: Secondary | ICD-10-CM | POA: Diagnosis not present

## 2023-05-02 DIAGNOSIS — D631 Anemia in chronic kidney disease: Secondary | ICD-10-CM | POA: Diagnosis not present

## 2023-05-02 DIAGNOSIS — E1151 Type 2 diabetes mellitus with diabetic peripheral angiopathy without gangrene: Secondary | ICD-10-CM | POA: Diagnosis not present

## 2023-05-02 DIAGNOSIS — E1122 Type 2 diabetes mellitus with diabetic chronic kidney disease: Secondary | ICD-10-CM | POA: Diagnosis not present

## 2023-05-02 DIAGNOSIS — I5032 Chronic diastolic (congestive) heart failure: Secondary | ICD-10-CM | POA: Diagnosis not present

## 2023-05-02 DIAGNOSIS — I495 Sick sinus syndrome: Secondary | ICD-10-CM

## 2023-05-02 DIAGNOSIS — N183 Chronic kidney disease, stage 3 unspecified: Secondary | ICD-10-CM | POA: Diagnosis not present

## 2023-05-03 DIAGNOSIS — I130 Hypertensive heart and chronic kidney disease with heart failure and stage 1 through stage 4 chronic kidney disease, or unspecified chronic kidney disease: Secondary | ICD-10-CM | POA: Diagnosis not present

## 2023-05-03 DIAGNOSIS — E1122 Type 2 diabetes mellitus with diabetic chronic kidney disease: Secondary | ICD-10-CM | POA: Diagnosis not present

## 2023-05-03 DIAGNOSIS — E1151 Type 2 diabetes mellitus with diabetic peripheral angiopathy without gangrene: Secondary | ICD-10-CM | POA: Diagnosis not present

## 2023-05-03 DIAGNOSIS — N183 Chronic kidney disease, stage 3 unspecified: Secondary | ICD-10-CM | POA: Diagnosis not present

## 2023-05-03 DIAGNOSIS — D631 Anemia in chronic kidney disease: Secondary | ICD-10-CM | POA: Diagnosis not present

## 2023-05-03 DIAGNOSIS — I5032 Chronic diastolic (congestive) heart failure: Secondary | ICD-10-CM | POA: Diagnosis not present

## 2023-05-03 LAB — CUP PACEART REMOTE DEVICE CHECK
Battery Impedance: 3288 Ohm
Battery Remaining Longevity: 17 mo
Battery Voltage: 2.71 V
Brady Statistic AP VP Percent: 93 %
Brady Statistic AP VS Percent: 0 %
Brady Statistic AS VP Percent: 7 %
Brady Statistic AS VS Percent: 0 %
Date Time Interrogation Session: 20250122111409
Implantable Lead Connection Status: 753985
Implantable Lead Connection Status: 753985
Implantable Lead Implant Date: 20131126
Implantable Lead Implant Date: 20131126
Implantable Lead Location: 753859
Implantable Lead Location: 753860
Implantable Lead Model: 5076
Implantable Lead Model: 5092
Implantable Pulse Generator Implant Date: 20131126
Lead Channel Impedance Value: 441 Ohm
Lead Channel Impedance Value: 454 Ohm
Lead Channel Pacing Threshold Amplitude: 0.5 V
Lead Channel Pacing Threshold Amplitude: 1.125 V
Lead Channel Pacing Threshold Pulse Width: 0.4 ms
Lead Channel Pacing Threshold Pulse Width: 0.4 ms
Lead Channel Setting Pacing Amplitude: 2 V
Lead Channel Setting Pacing Amplitude: 2.5 V
Lead Channel Setting Pacing Pulse Width: 0.4 ms
Lead Channel Setting Sensing Sensitivity: 2.8 mV
Zone Setting Status: 755011
Zone Setting Status: 755011

## 2023-05-04 ENCOUNTER — Ambulatory Visit: Payer: Self-pay | Admitting: Licensed Clinical Social Worker

## 2023-05-04 DIAGNOSIS — E1151 Type 2 diabetes mellitus with diabetic peripheral angiopathy without gangrene: Secondary | ICD-10-CM | POA: Diagnosis not present

## 2023-05-04 DIAGNOSIS — N183 Chronic kidney disease, stage 3 unspecified: Secondary | ICD-10-CM | POA: Diagnosis not present

## 2023-05-04 DIAGNOSIS — I5032 Chronic diastolic (congestive) heart failure: Secondary | ICD-10-CM | POA: Diagnosis not present

## 2023-05-04 DIAGNOSIS — E1122 Type 2 diabetes mellitus with diabetic chronic kidney disease: Secondary | ICD-10-CM | POA: Diagnosis not present

## 2023-05-04 DIAGNOSIS — I130 Hypertensive heart and chronic kidney disease with heart failure and stage 1 through stage 4 chronic kidney disease, or unspecified chronic kidney disease: Secondary | ICD-10-CM | POA: Diagnosis not present

## 2023-05-04 DIAGNOSIS — D631 Anemia in chronic kidney disease: Secondary | ICD-10-CM | POA: Diagnosis not present

## 2023-05-04 NOTE — Patient Outreach (Addendum)
  Care Coordination   Follow Up Visit Note   05/04/2023 Name: [name] MRN: [id number] DOB: Aug 18, 1932  [name] is a 89 y.o. year old female who sees [name], Grayling Congress, DO for primary care. I spoke with  [name] by phone today.  What matters to the patients health and wellness today?  Compression socks Sw provided the patient with the contact number to [facility]    Goals Addressed             This Visit's Progress    Care Coordination Activities   On track    Care Coordination Interventions: Patient stated that she wants a HHA in the home to assist with her daily bathes, she stated that she talked and discussed with her PCP but does not know if that order was written and would like the SW to see if it was written by the PCP. SW will in box the PCP to find out the status.  SW will follow up with patient in 2 weeks         SDOH assessments and interventions completed:  Yes  SDOH Interventions Today    Flowsheet Row Most Recent Value  SDOH Interventions   Food Insecurity Interventions Intervention Not Indicated  Housing Interventions Intervention Not Indicated  Transportation Interventions Intervention Not Indicated  Utilities Interventions Intervention Not Indicated        Care Coordination Interventions:  Yes, provided   Follow up plan: Follow up call scheduled for 05/23/2023 at 10:30 am    Encounter Outcome:  Patient Visit Completed   .c

## 2023-05-04 NOTE — Patient Instructions (Signed)
Visit Information  Thank you for taking time to visit with me today. Please don't hesitate to contact me if I can be of assistance to you.   Following are the goals we discussed today:   Goals Addressed             This Visit's Progress    Care Coordination Activities   On track    Care Coordination Interventions: Patient stated that she wants a HHA in the home to assist with her daily bathes, she stated that she talked and discussed with her PCP but does not know if that order was written and would like the SW to see if it was written by the PCP. SW will in box the PCP to find out the status.  SW will follow up with patient in 2 weeks         Our next appointment is by telephone on 05/23/2023 at 10:30 am  Please call the care guide team at [phone] if you need to cancel or reschedule your appointment.   If you are experiencing a Mental Health or Behavioral Health Crisis or need someone to talk to, please call the Suicide and Crisis Lifeline: 988 go to [facility] [address] [place] [phone]) call 911  The patient verbalized understanding of instructions, educational materials, and care plan provided today and DECLINED offer to receive copy of patient instructions, educational materials, and care plan.   Jeanie Cooks, PhD [facility], [facility] Social Worker Direct Dial: [phone]  Fax: [phone]

## 2023-05-07 ENCOUNTER — Other Ambulatory Visit: Payer: Self-pay | Admitting: Physician Assistant

## 2023-05-07 DIAGNOSIS — I5032 Chronic diastolic (congestive) heart failure: Secondary | ICD-10-CM

## 2023-05-09 DIAGNOSIS — I130 Hypertensive heart and chronic kidney disease with heart failure and stage 1 through stage 4 chronic kidney disease, or unspecified chronic kidney disease: Secondary | ICD-10-CM | POA: Diagnosis not present

## 2023-05-09 DIAGNOSIS — D631 Anemia in chronic kidney disease: Secondary | ICD-10-CM | POA: Diagnosis not present

## 2023-05-09 DIAGNOSIS — E1151 Type 2 diabetes mellitus with diabetic peripheral angiopathy without gangrene: Secondary | ICD-10-CM | POA: Diagnosis not present

## 2023-05-09 DIAGNOSIS — I5032 Chronic diastolic (congestive) heart failure: Secondary | ICD-10-CM | POA: Diagnosis not present

## 2023-05-09 DIAGNOSIS — E1122 Type 2 diabetes mellitus with diabetic chronic kidney disease: Secondary | ICD-10-CM | POA: Diagnosis not present

## 2023-05-09 DIAGNOSIS — N183 Chronic kidney disease, stage 3 unspecified: Secondary | ICD-10-CM | POA: Diagnosis not present

## 2023-05-11 DIAGNOSIS — E1151 Type 2 diabetes mellitus with diabetic peripheral angiopathy without gangrene: Secondary | ICD-10-CM | POA: Diagnosis not present

## 2023-05-11 DIAGNOSIS — E1122 Type 2 diabetes mellitus with diabetic chronic kidney disease: Secondary | ICD-10-CM | POA: Diagnosis not present

## 2023-05-11 DIAGNOSIS — D631 Anemia in chronic kidney disease: Secondary | ICD-10-CM | POA: Diagnosis not present

## 2023-05-11 DIAGNOSIS — I130 Hypertensive heart and chronic kidney disease with heart failure and stage 1 through stage 4 chronic kidney disease, or unspecified chronic kidney disease: Secondary | ICD-10-CM | POA: Diagnosis not present

## 2023-05-11 DIAGNOSIS — N183 Chronic kidney disease, stage 3 unspecified: Secondary | ICD-10-CM | POA: Diagnosis not present

## 2023-05-11 DIAGNOSIS — I5032 Chronic diastolic (congestive) heart failure: Secondary | ICD-10-CM | POA: Diagnosis not present

## 2023-05-17 DIAGNOSIS — D631 Anemia in chronic kidney disease: Secondary | ICD-10-CM | POA: Diagnosis not present

## 2023-05-17 DIAGNOSIS — E1122 Type 2 diabetes mellitus with diabetic chronic kidney disease: Secondary | ICD-10-CM | POA: Diagnosis not present

## 2023-05-17 DIAGNOSIS — E1151 Type 2 diabetes mellitus with diabetic peripheral angiopathy without gangrene: Secondary | ICD-10-CM | POA: Diagnosis not present

## 2023-05-17 DIAGNOSIS — I5032 Chronic diastolic (congestive) heart failure: Secondary | ICD-10-CM | POA: Diagnosis not present

## 2023-05-17 DIAGNOSIS — I130 Hypertensive heart and chronic kidney disease with heart failure and stage 1 through stage 4 chronic kidney disease, or unspecified chronic kidney disease: Secondary | ICD-10-CM | POA: Diagnosis not present

## 2023-05-17 DIAGNOSIS — N183 Chronic kidney disease, stage 3 unspecified: Secondary | ICD-10-CM | POA: Diagnosis not present

## 2023-05-19 ENCOUNTER — Ambulatory Visit: Payer: Medicare Other | Admitting: Family Medicine

## 2023-05-19 DIAGNOSIS — N183 Chronic kidney disease, stage 3 unspecified: Secondary | ICD-10-CM | POA: Diagnosis not present

## 2023-05-19 DIAGNOSIS — I130 Hypertensive heart and chronic kidney disease with heart failure and stage 1 through stage 4 chronic kidney disease, or unspecified chronic kidney disease: Secondary | ICD-10-CM | POA: Diagnosis not present

## 2023-05-19 DIAGNOSIS — I5032 Chronic diastolic (congestive) heart failure: Secondary | ICD-10-CM | POA: Diagnosis not present

## 2023-05-19 DIAGNOSIS — E1151 Type 2 diabetes mellitus with diabetic peripheral angiopathy without gangrene: Secondary | ICD-10-CM | POA: Diagnosis not present

## 2023-05-19 DIAGNOSIS — D631 Anemia in chronic kidney disease: Secondary | ICD-10-CM | POA: Diagnosis not present

## 2023-05-19 DIAGNOSIS — E1122 Type 2 diabetes mellitus with diabetic chronic kidney disease: Secondary | ICD-10-CM | POA: Diagnosis not present

## 2023-05-19 NOTE — Progress Notes (Deleted)
   Samantha Ileana Collet, PhD, LAT, ATC acting as a scribe for Artist Lloyd, MD.  [name] is a 89 y.o. female who presents to [facility] today for f/u R hip pain. Pt was last seen by Dr. Lloyd on 04/14/23 and was given a R GT steroid injection.  Today, pt reports ***  Pertinent review of systems: ***  Relevant historical information: ***   Exam:  There were no vitals taken for this visit. General: Well Developed, well nourished, and in no acute distress.   MSK: ***    Lab and Radiology Results No results found for this or any previous visit (from the past 72 hours). No results found.     Assessment and Plan: 89 y.o. female with ***   PDMP not reviewed this encounter. No orders of the defined types were placed in this encounter.  No orders of the defined types were placed in this encounter.    Discussed warning signs or symptoms. Please see discharge instructions. Patient expresses understanding.   ***

## 2023-05-22 ENCOUNTER — Ambulatory Visit: Payer: Self-pay

## 2023-05-22 NOTE — Patient Outreach (Signed)
  Care Coordination   Follow Up Visit Note   05/22/2023 Name: SENIYA GOUKER MRN: [id number] DOB: [birth year]  Marcell Serene Auten is a 89 y.o. year old female who sees Crecencio Dodge, [name], DO for primary care. I spoke with  Cherre Cornish by phone today.  What matters to the patients health and wellness today?  Ms Kalb states she has not obtained compression hose. She reports the edema has improved in her legs, but she states she still plans to get them. She denies any specific questions or concerns at this time.  Goals Addressed             This Visit's Progress    Assist with health management       Interventions Today    Flowsheet Row Most Recent Value  Chronic Disease   Chronic disease during today's visit Other, Hypertension (HTN)  General Interventions   General Interventions Discussed/Reviewed General Interventions Reviewed, Doctor Visits  Doctor Visits Discussed/Reviewed Doctor Visits Reviewed, PCP, Specialist  PCP/Specialist Visits Compliance with follow-up visit  [reviewed upcoming appointments. discussed missed appointment with sports medicine and advised patient to reschedule. patient states she has an appoimtment with Clinical biochemist on 05/30/23]  Exercise Interventions   Exercise Discussed/Reviewed Physical Activity  Physical Activity Discussed/Reviewed Physical Activity Reviewed  [advised remain active, activity as tolerated.]  Education Interventions   Education Provided Provided Education  Provided Verbal Education On Medication, When to see the doctor, Blood Sugar Monitoring, Eye Care, Labs, Exercise  [advised to take medications as prescribed, attend provider visits as scheduled, contact provider with health questions or concerns. reiterated purpose of complression hose, also encouraged to discuss with insurance provider us  of [employer] to obtain.]  Labs Reviewed Hgb A1c  [A1C 7.0 on 04/17/23 down from 7.2 on 10/14/22.]  Pharmacy Interventions   Pharmacy  Dicussed/Reviewed Pharmacy Topics Reviewed  [medications reviewed. assessed if any medication management needs]            SDOH assessments and interventions completed:  No  Care Coordination Interventions:  Yes, provided   Follow up plan: Follow up call scheduled for 07/10/22    Encounter Outcome:  Patient Visit Completed   Lindi Revering, RN, MSN, BSN, CCM [place]  [facility], Population Health Case Manager Phone: [phone]

## 2023-05-22 NOTE — Patient Instructions (Signed)
 Visit Information  Thank you for taking time to visit with me today. Please don't hesitate to contact me if I can be of assistance to you.   Following are the goals we discussed today:  Continue to take medications as prescribed. Continue to attend provider visits as scheduled Continue to eat healthy, lean meats, vegetables, fruits, avoid saturated and transfats Contact provider with health questions or concerns as needed  Our next appointment is by telephone on 07/10/23 at 11:00 am  Please call the care guide team at [phone] if you need to cancel or reschedule your appointment.   If you are experiencing a Mental Health or Behavioral Health Crisis or need someone to talk to, please call the Suicide and Crisis Lifeline: 988 call the [place]  National Suicide Prevention Lifeline: 862-208-8686 or TTY: (270)565-5715 TTY 6800323224) to talk to a trained counselor  Lindi Revering, RN, MSN, BSN, CCM [place]  [facility], Population Health Case Manager Phone: [phone]

## 2023-05-23 ENCOUNTER — Ambulatory Visit: Payer: Self-pay | Admitting: Licensed Clinical Social Worker

## 2023-05-23 DIAGNOSIS — D631 Anemia in chronic kidney disease: Secondary | ICD-10-CM | POA: Diagnosis not present

## 2023-05-23 DIAGNOSIS — E1151 Type 2 diabetes mellitus with diabetic peripheral angiopathy without gangrene: Secondary | ICD-10-CM | POA: Diagnosis not present

## 2023-05-23 DIAGNOSIS — N183 Chronic kidney disease, stage 3 unspecified: Secondary | ICD-10-CM | POA: Diagnosis not present

## 2023-05-23 DIAGNOSIS — E1122 Type 2 diabetes mellitus with diabetic chronic kidney disease: Secondary | ICD-10-CM | POA: Diagnosis not present

## 2023-05-23 DIAGNOSIS — I130 Hypertensive heart and chronic kidney disease with heart failure and stage 1 through stage 4 chronic kidney disease, or unspecified chronic kidney disease: Secondary | ICD-10-CM | POA: Diagnosis not present

## 2023-05-23 DIAGNOSIS — I5032 Chronic diastolic (congestive) heart failure: Secondary | ICD-10-CM | POA: Diagnosis not present

## 2023-05-23 NOTE — Patient Outreach (Signed)
  Care Coordination   Follow Up Visit Note   05/23/2023 Name: [name] MRN: [id number] DOB: 1932/10/08  [name] is a 89 y.o. year old female who sees [name], Samantha Congress, DO for primary care. I spoke with  [name] by phone today.  What matters to the patients health and wellness today?  HHA and Compression socks    Goals Addressed             This Visit's Progress    Care Coordination Activities   On track    Care Coordination Interventions: Patient stated that she wants a HHA in the home to assist with her daily bathes, she stated that she talked and discussed with her PCP but does not know if that order was written and would like the SW to see if it was written by the PCP. SW will in box the PCP to find out the status.  SW will follow up with patient in 2 weeks         SDOH assessments and interventions completed:  Yes  SDOH Interventions Today    Flowsheet Row Most Recent Value  SDOH Interventions   Housing Interventions Intervention Not Indicated  Transportation Interventions Intervention Not Indicated  Utilities Interventions Intervention Not Indicated        Care Coordination Interventions:  Yes, provided  Interventions Today    Flowsheet Row Most Recent Value  General Interventions   General Interventions Discussed/Reviewed General Interventions Reviewed, [employer], Communication with  [SW will message the PCP in regards to a HHA and compression socks RX, wants the nurse to call her .]  Doctor Visits Discussed/Reviewed PCP  Communication with PCP/Specialists  [SW will send a message to PCP at the request of the patient]        Follow up plan: Follow up call scheduled for 06/08/2023 at 10:30 am    Encounter Outcome:  Patient Visit Completed   Jeanie Cooks, PhD [facility], [facility] Social Worker Direct Dial: [phone]  Fax: [phone]

## 2023-05-23 NOTE — Patient Instructions (Signed)
Visit Information  Thank you for taking time to visit with me today. Please don't hesitate to contact me if I can be of assistance to you.   Following are the goals we discussed today:   Goals Addressed             This Visit's Progress    Care Coordination Activities   On track    Care Coordination Interventions: Patient stated that she wants a HHA in the home to assist with her daily bathes, she stated that she talked and discussed with her PCP but does not know if that order was written and would like the SW to see if it was written by the PCP. SW will in box the PCP to find out the status.  SW will follow up with patient in 2 weeks         Our next appointment is by telephone on 06/08/2023 at 10:30 am  Please call the care guide team at [phone] if you need to cancel or reschedule your appointment.   If you are experiencing a Mental Health or Behavioral Health Crisis or need someone to talk to, please call the Suicide and Crisis Lifeline: 988 go to [facility] [address] [place] [phone]) call 911  The patient verbalized understanding of instructions, educational materials, and care plan provided today and DECLINED offer to receive copy of patient instructions, educational materials, and care plan.   Jeanie Cooks, PhD [facility], [facility] Social Worker Direct Dial: [phone]  Fax: [phone]

## 2023-05-24 ENCOUNTER — Telehealth: Payer: Self-pay | Admitting: Emergency Medicine

## 2023-05-24 NOTE — Telephone Encounter (Signed)
Copied from CRM [id number]. Topic: Clinical - Medication Question >> May 24, 2023  9:47 AM Louie Boston wrote: Reason for CRM: Jeannett Senior called in from [facility] regarding Insulin that patient is currently taking. He is requesting an immediate call back regarding Insulin as he needs the information for patient. Please contact him at [phone].

## 2023-05-25 ENCOUNTER — Telehealth: Payer: Self-pay

## 2023-05-25 DIAGNOSIS — I5032 Chronic diastolic (congestive) heart failure: Secondary | ICD-10-CM | POA: Diagnosis not present

## 2023-05-25 DIAGNOSIS — I130 Hypertensive heart and chronic kidney disease with heart failure and stage 1 through stage 4 chronic kidney disease, or unspecified chronic kidney disease: Secondary | ICD-10-CM | POA: Diagnosis not present

## 2023-05-25 DIAGNOSIS — E1122 Type 2 diabetes mellitus with diabetic chronic kidney disease: Secondary | ICD-10-CM | POA: Diagnosis not present

## 2023-05-25 DIAGNOSIS — D631 Anemia in chronic kidney disease: Secondary | ICD-10-CM | POA: Diagnosis not present

## 2023-05-25 DIAGNOSIS — E1151 Type 2 diabetes mellitus with diabetic peripheral angiopathy without gangrene: Secondary | ICD-10-CM | POA: Diagnosis not present

## 2023-05-25 DIAGNOSIS — N183 Chronic kidney disease, stage 3 unspecified: Secondary | ICD-10-CM | POA: Diagnosis not present

## 2023-05-25 NOTE — Telephone Encounter (Signed)
Copied from CRM [id number]. Topic: Clinical - Home Health Verbal Orders >> May 25, 2023  3:26 PM Drema Balzarine wrote: Caller/Agency: [facility]  Callback Number: [phone] Service Requested: Skilled Nursing Frequency:  Any new concerns about the patient? Yes, requesting nursing evaluation for patient

## 2023-05-25 NOTE — Telephone Encounter (Signed)
Spoke with Jeannett Senior. Advised medication was discontinued in 08/2022

## 2023-05-26 NOTE — Telephone Encounter (Signed)
Verbal given via voicemail

## 2023-05-30 DIAGNOSIS — E1122 Type 2 diabetes mellitus with diabetic chronic kidney disease: Secondary | ICD-10-CM | POA: Diagnosis not present

## 2023-05-30 DIAGNOSIS — E1151 Type 2 diabetes mellitus with diabetic peripheral angiopathy without gangrene: Secondary | ICD-10-CM | POA: Diagnosis not present

## 2023-05-30 DIAGNOSIS — D631 Anemia in chronic kidney disease: Secondary | ICD-10-CM | POA: Diagnosis not present

## 2023-05-30 DIAGNOSIS — I5032 Chronic diastolic (congestive) heart failure: Secondary | ICD-10-CM | POA: Diagnosis not present

## 2023-05-30 DIAGNOSIS — I130 Hypertensive heart and chronic kidney disease with heart failure and stage 1 through stage 4 chronic kidney disease, or unspecified chronic kidney disease: Secondary | ICD-10-CM | POA: Diagnosis not present

## 2023-05-30 DIAGNOSIS — N183 Chronic kidney disease, stage 3 unspecified: Secondary | ICD-10-CM | POA: Diagnosis not present

## 2023-05-31 DIAGNOSIS — I5032 Chronic diastolic (congestive) heart failure: Secondary | ICD-10-CM | POA: Diagnosis not present

## 2023-05-31 DIAGNOSIS — E1122 Type 2 diabetes mellitus with diabetic chronic kidney disease: Secondary | ICD-10-CM | POA: Diagnosis not present

## 2023-05-31 DIAGNOSIS — D631 Anemia in chronic kidney disease: Secondary | ICD-10-CM | POA: Diagnosis not present

## 2023-05-31 DIAGNOSIS — E1151 Type 2 diabetes mellitus with diabetic peripheral angiopathy without gangrene: Secondary | ICD-10-CM | POA: Diagnosis not present

## 2023-05-31 DIAGNOSIS — I130 Hypertensive heart and chronic kidney disease with heart failure and stage 1 through stage 4 chronic kidney disease, or unspecified chronic kidney disease: Secondary | ICD-10-CM | POA: Diagnosis not present

## 2023-05-31 DIAGNOSIS — N183 Chronic kidney disease, stage 3 unspecified: Secondary | ICD-10-CM | POA: Diagnosis not present

## 2023-06-03 ENCOUNTER — Other Ambulatory Visit: Payer: Self-pay | Admitting: Cardiovascular Disease

## 2023-06-05 ENCOUNTER — Other Ambulatory Visit: Payer: Self-pay | Admitting: Cardiovascular Disease

## 2023-06-05 ENCOUNTER — Telehealth: Payer: Self-pay

## 2023-06-05 DIAGNOSIS — E785 Hyperlipidemia, unspecified: Secondary | ICD-10-CM

## 2023-06-05 NOTE — Telephone Encounter (Signed)
 Copied from CRM [id number]. Topic: Clinical - Home Health Verbal Orders >> Jun 05, 2023  8:50 AM Lennart Pall wrote: Caller/Agency: [facility] Callback Number: [phone] Service Requested: Skilled Nursing- Diabetes teaching Frequency: 1 time a week for 3 weeks-  Any new concerns about the patient? No

## 2023-06-06 DIAGNOSIS — E1122 Type 2 diabetes mellitus with diabetic chronic kidney disease: Secondary | ICD-10-CM | POA: Diagnosis not present

## 2023-06-06 DIAGNOSIS — N183 Chronic kidney disease, stage 3 unspecified: Secondary | ICD-10-CM | POA: Diagnosis not present

## 2023-06-06 DIAGNOSIS — I5032 Chronic diastolic (congestive) heart failure: Secondary | ICD-10-CM | POA: Diagnosis not present

## 2023-06-06 DIAGNOSIS — D631 Anemia in chronic kidney disease: Secondary | ICD-10-CM | POA: Diagnosis not present

## 2023-06-06 DIAGNOSIS — I130 Hypertensive heart and chronic kidney disease with heart failure and stage 1 through stage 4 chronic kidney disease, or unspecified chronic kidney disease: Secondary | ICD-10-CM | POA: Diagnosis not present

## 2023-06-06 DIAGNOSIS — E1151 Type 2 diabetes mellitus with diabetic peripheral angiopathy without gangrene: Secondary | ICD-10-CM | POA: Diagnosis not present

## 2023-06-06 NOTE — Telephone Encounter (Signed)
 Verbal given

## 2023-06-07 DIAGNOSIS — D631 Anemia in chronic kidney disease: Secondary | ICD-10-CM | POA: Diagnosis not present

## 2023-06-07 DIAGNOSIS — N183 Chronic kidney disease, stage 3 unspecified: Secondary | ICD-10-CM | POA: Diagnosis not present

## 2023-06-07 DIAGNOSIS — E1151 Type 2 diabetes mellitus with diabetic peripheral angiopathy without gangrene: Secondary | ICD-10-CM | POA: Diagnosis not present

## 2023-06-07 DIAGNOSIS — E1122 Type 2 diabetes mellitus with diabetic chronic kidney disease: Secondary | ICD-10-CM | POA: Diagnosis not present

## 2023-06-07 DIAGNOSIS — I5032 Chronic diastolic (congestive) heart failure: Secondary | ICD-10-CM | POA: Diagnosis not present

## 2023-06-07 DIAGNOSIS — I130 Hypertensive heart and chronic kidney disease with heart failure and stage 1 through stage 4 chronic kidney disease, or unspecified chronic kidney disease: Secondary | ICD-10-CM | POA: Diagnosis not present

## 2023-06-08 ENCOUNTER — Ambulatory Visit: Payer: Self-pay | Admitting: Licensed Clinical Social Worker

## 2023-06-08 DIAGNOSIS — E1151 Type 2 diabetes mellitus with diabetic peripheral angiopathy without gangrene: Secondary | ICD-10-CM | POA: Diagnosis not present

## 2023-06-08 DIAGNOSIS — I5032 Chronic diastolic (congestive) heart failure: Secondary | ICD-10-CM | POA: Diagnosis not present

## 2023-06-08 DIAGNOSIS — N183 Chronic kidney disease, stage 3 unspecified: Secondary | ICD-10-CM | POA: Diagnosis not present

## 2023-06-08 DIAGNOSIS — E1122 Type 2 diabetes mellitus with diabetic chronic kidney disease: Secondary | ICD-10-CM | POA: Diagnosis not present

## 2023-06-08 DIAGNOSIS — I130 Hypertensive heart and chronic kidney disease with heart failure and stage 1 through stage 4 chronic kidney disease, or unspecified chronic kidney disease: Secondary | ICD-10-CM | POA: Diagnosis not present

## 2023-06-08 DIAGNOSIS — D631 Anemia in chronic kidney disease: Secondary | ICD-10-CM | POA: Diagnosis not present

## 2023-06-08 NOTE — Patient Outreach (Signed)
 Care Coordination   Follow Up Visit Note   06/08/2023 Name: ROANNE HAYE MRN: [id number] DOB: [birth year]  [name] Sultana is a 89 y.o. year old female who sees [name], Grayling Congress, DO for primary care. I spoke with  [name] by phone today.  What matters to the patients health and wellness today?  HHA and Compression socks     Goals Addressed             This Visit's Progress    Care Coordination Activities   On track    Care Coordination Interventions: Patient stated that she wants a HHA in the home to assist with her daily bathes, she stated that she talked and discussed with her PCP but does not know if that order was written and would like the SW to see if it was written by the PCP. SW will in box the PCP to find out the status.  [facility] will be coming to the home today to do an evaluatuion and patient will mention the compression socks as well  SW will follow up with patient on 06/26/2023 at 10:00 am        SDOH assessments and interventions completed:  Yes  SDOH Interventions Today    Flowsheet Row Most Recent Value  SDOH Interventions   Food Insecurity Interventions Intervention Not Indicated  Housing Interventions Intervention Not Indicated  Transportation Interventions Intervention Not Indicated  Utilities Interventions Intervention Not Indicated        Care Coordination Interventions:  Yes, provided  Interventions Today    Flowsheet Row Most Recent Value  General Interventions   General Interventions Discussed/Reviewed General Interventions Reviewed, Community Resources  [[facility] will be coming to the home today to do an evaluatuion and patient will mention the compression socks as well]        Follow up plan: Follow up call scheduled for 06/26/2023 at 10:00 am    Encounter Outcome:  Patient Visit Completed  Jeanie Cooks, PhD [facility], [facility], [facility] Social Worker Direct Dial:  [phone]  Fax: [phone]

## 2023-06-08 NOTE — Patient Instructions (Signed)
 Visit Information  Thank you for taking time to visit with me today. Please don't hesitate to contact me if I can be of assistance to you.   Following are the goals we discussed today:   Goals Addressed             This Visit's Progress    Care Coordination Activities   On track    Care Coordination Interventions: Patient stated that she wants a HHA in the home to assist with her daily bathes, she stated that she talked and discussed with her PCP but does not know if that order was written and would like the SW to see if it was written by the PCP. SW will in box the PCP to find out the status.  [facility] will be coming to the home today to do an evaluatuion and patient will mention the compression socks as well  SW will follow up with patient on 06/26/2023 at 10:00 am        Our next appointment is by telephone on 06/26/2023 at 10:00 am  Please call the care guide team at [phone] if you need to cancel or reschedule your appointment.   If you are experiencing a Mental Health or Behavioral Health Crisis or need someone to talk to, please call the Suicide and Crisis Lifeline: 988 go to [facility] [address], [place] [phone]) call 911  The patient verbalized understanding of instructions, educational materials, and care plan provided today and DECLINED offer to receive copy of patient instructions, educational materials, and care plan.   Jeanie Cooks, PhD [facility], [facility] Social Worker Direct Dial: [phone]  Fax: [phone]

## 2023-06-12 NOTE — Progress Notes (Signed)
 Remote pacemaker transmission.

## 2023-06-12 NOTE — Addendum Note (Signed)
 Addended by: Geralyn Flash D on: 06/12/2023 02:44 PM   Modules accepted: Orders

## 2023-06-15 DIAGNOSIS — E1122 Type 2 diabetes mellitus with diabetic chronic kidney disease: Secondary | ICD-10-CM | POA: Diagnosis not present

## 2023-06-15 DIAGNOSIS — N183 Chronic kidney disease, stage 3 unspecified: Secondary | ICD-10-CM | POA: Diagnosis not present

## 2023-06-15 DIAGNOSIS — I130 Hypertensive heart and chronic kidney disease with heart failure and stage 1 through stage 4 chronic kidney disease, or unspecified chronic kidney disease: Secondary | ICD-10-CM | POA: Diagnosis not present

## 2023-06-15 DIAGNOSIS — E1151 Type 2 diabetes mellitus with diabetic peripheral angiopathy without gangrene: Secondary | ICD-10-CM | POA: Diagnosis not present

## 2023-06-15 DIAGNOSIS — D631 Anemia in chronic kidney disease: Secondary | ICD-10-CM | POA: Diagnosis not present

## 2023-06-15 DIAGNOSIS — I5032 Chronic diastolic (congestive) heart failure: Secondary | ICD-10-CM | POA: Diagnosis not present

## 2023-06-26 ENCOUNTER — Other Ambulatory Visit: Payer: Self-pay | Admitting: Physician Assistant

## 2023-06-26 ENCOUNTER — Ambulatory Visit: Payer: Self-pay | Admitting: Licensed Clinical Social Worker

## 2023-06-26 NOTE — Patient Instructions (Signed)
 Visit Information  Thank you for taking time to visit with me today. Please don't hesitate to contact me if I can be of assistance to you.   Following are the goals we discussed today:   Goals Addressed             This Visit's Progress    COMPLETED: Care Coordination Activities       Care Coordination Interventions: Patient stated that she wants a HHA in the home to assist with her daily bathes, she stated that she talked and discussed with her PCP but does not know if that order was written and would like the SW to see if it was written by the PCP. SW will in box the PCP to find out the status.  [facility] will be coming to the home today to do an evaluatuion and patient will mention the compression socks as well  SW will follow up with patient on 06/26/2023 at 10:00 am        No follow up needed, Sw has encouraged the patient to contact the PCP if any SDOH needs arise.   Please call the care guide team at [phone] if you need to cancel or reschedule your appointment.   If you are experiencing a Mental Health or Behavioral Health Crisis or need someone to talk to, please call the Suicide and Crisis Lifeline: 988 go to [facility] [address] [place] [phone]) call 911  The patient verbalized understanding of instructions, educational materials, and care plan provided today and DECLINED offer to receive copy of patient instructions, educational materials, and care plan.   Jeanie Cooks, PhD [facility], [facility] Social Worker Direct Dial: [phone]  Fax: [phone]

## 2023-06-26 NOTE — Patient Outreach (Signed)
 Care Coordination   Follow Up Visit Note   06/26/2023  Name: [name] MRN: [id number] DOB: May 25, 1932  [name] is a 89 y.o. year old female who sees [name], Grayling Congress, DO for primary care. I spoke with  [name] by phone today.  What matters to the patients health and wellness today?  HHA and Compreesion socks and patient has and is receiving both , SW will close out case.     Goals Addressed             This Visit's Progress    COMPLETED: Care Coordination Activities       Care Coordination Interventions: Patient stated that she wants a HHA in the home to assist with her daily bathes, she stated that she talked and discussed with her PCP but does not know if that order was written and would like the SW to see if it was written by the PCP. SW will in box the PCP to find out the status.  [facility] will be coming to the home today to do an evaluatuion and patient will mention the compression socks as well  SW will follow up with patient on 06/26/2023 at 10:00 am        SDOH assessments and interventions completed:  Yes  SDOH Interventions Today    Flowsheet Row Most Recent Value  SDOH Interventions   Food Insecurity Interventions Intervention Not Indicated  Housing Interventions Intervention Not Indicated  Transportation Interventions Intervention Not Indicated  Utilities Interventions Intervention Not Indicated        Care Coordination Interventions:  Yes, provided  Interventions Today    Flowsheet Row Most Recent Value  General Interventions   General Interventions Discussed/Reviewed General Interventions Reviewed  [Patient is gettign HHA from [place] and has received her compression socks]        Follow up plan: No further intervention required.   Encounter Outcome:  Patient Visit Completed  Jeanie Cooks, PhD [facility], [facility] Social Worker Direct Dial: [phone]  Fax:  [phone]

## 2023-06-29 ENCOUNTER — Encounter: Payer: Self-pay | Admitting: Family Medicine

## 2023-06-29 ENCOUNTER — Ambulatory Visit (INDEPENDENT_AMBULATORY_CARE_PROVIDER_SITE_OTHER): Admitting: Family Medicine

## 2023-06-29 VITALS — BP 100/82 | HR 78 | Temp 98.0°F | Resp 18 | Ht 66.0 in

## 2023-06-29 DIAGNOSIS — L97529 Non-pressure chronic ulcer of other part of left foot with unspecified severity: Secondary | ICD-10-CM

## 2023-06-29 DIAGNOSIS — E11620 Type 2 diabetes mellitus with diabetic dermatitis: Secondary | ICD-10-CM

## 2023-06-29 DIAGNOSIS — B354 Tinea corporis: Secondary | ICD-10-CM

## 2023-06-29 DIAGNOSIS — E785 Hyperlipidemia, unspecified: Secondary | ICD-10-CM

## 2023-06-29 DIAGNOSIS — M48061 Spinal stenosis, lumbar region without neurogenic claudication: Secondary | ICD-10-CM

## 2023-06-29 DIAGNOSIS — N1832 Chronic kidney disease, stage 3b: Secondary | ICD-10-CM

## 2023-06-29 DIAGNOSIS — M79605 Pain in left leg: Secondary | ICD-10-CM

## 2023-06-29 DIAGNOSIS — I10 Essential (primary) hypertension: Secondary | ICD-10-CM

## 2023-06-29 DIAGNOSIS — R300 Dysuria: Secondary | ICD-10-CM

## 2023-06-29 DIAGNOSIS — I495 Sick sinus syndrome: Secondary | ICD-10-CM

## 2023-06-29 DIAGNOSIS — G20C Parkinsonism, unspecified: Secondary | ICD-10-CM

## 2023-06-29 DIAGNOSIS — R42 Dizziness and giddiness: Secondary | ICD-10-CM

## 2023-06-29 DIAGNOSIS — I5032 Chronic diastolic (congestive) heart failure: Secondary | ICD-10-CM

## 2023-06-29 DIAGNOSIS — M159 Polyosteoarthritis, unspecified: Secondary | ICD-10-CM

## 2023-06-29 DIAGNOSIS — M109 Gout, unspecified: Secondary | ICD-10-CM

## 2023-06-29 LAB — POC URINALSYSI DIPSTICK (AUTOMATED)
Bilirubin, UA: NEGATIVE
Blood, UA: NEGATIVE
Glucose, UA: NEGATIVE
Ketones, UA: NEGATIVE
Nitrite, UA: NEGATIVE
Protein, UA: NEGATIVE
Spec Grav, UA: 1.01 (ref 1.010–1.025)
Urobilinogen, UA: 0.2 U/dL
pH, UA: 6.5 (ref 5.0–8.0)

## 2023-06-29 MED ORDER — NYSTATIN 100000 UNIT/GM EX POWD: 1.0000 | Freq: Three times a day (TID) | CUTANEOUS | 0 refills | Status: DC

## 2023-06-29 MED ORDER — METHOCARBAMOL 500 MG PO TABS: 500.0000 mg | ORAL_TABLET | Freq: Four times a day (QID) | ORAL | 0 refills | Status: AC | PRN

## 2023-06-29 MED ORDER — CLOTRIMAZOLE-BETAMETHASONE 1-0.05 % EX CREA: 1.0000 | TOPICAL_CREAM | Freq: Every day | CUTANEOUS | 0 refills | Status: DC

## 2023-06-29 NOTE — Progress Notes (Signed)
 Established Patient Office Visit  Subjective   Patient ID: [name], female    DOB: 10-30-1932  Age: 89 y.o. MRN: [id number]  Chief Complaint  Patient presents with   Vaginal Itching    X2 days ago, Pt states having groin itching and redness. No discharge     HPI Discussed the use of AI scribe software for clinical note transcription with the patient, who gave verbal consent to proceed.  History of Present Illness   [name] is a 89 year old female who presents with recurrent skin irritation and dizziness.  She has been experiencing recurrent skin irritation since using Farxiga in the past. The irritation is external, with no discharge, and has persisted even after discontinuing [place]. She has used nystatin cream and a powder for treatment, finding the powder more effective as it helps keep the area dry. In severe cases, the irritation has caused bleeding, requiring antibiotics and the use of a pad. Currently, the irritation is less severe but still causes discomfort, particularly at night, affecting her sleep. She notes not receiving adequate assistance with bathing, which she believes may contribute to her recurrent skin irritation. The last caregiver visit was brief and did not include a full bath, which she finds challenging to manage on her own due to mobility issues.  She experiences dizziness that has been ongoing for a while. The dizziness occurs both with quick movements and when stationary, such as when getting up from bed to go to the bathroom. She describes feeling 'so dizzy I can't function,' affecting her ability to perform daily activities. No recent changes in blood pressure, which she recalls being 'one hundred over eighty'. She frequently feels lightheaded and tired.  She mentions having diarrhea, which she associates with taking Tylenol. She was taking Tylenol once daily, either in the morning or at night, but stopped after hearing about its potential  gastrointestinal side effects on television. Since discontinuing Tylenol, her diarrhea has improved, and she has not needed to take Imodium, which she was previously using every other day.      Patient Active Problem List   Diagnosis Date Noted   Dermatitis associated with moisture 03/03/2023   Open wound of skin 03/03/2023   Acute pain of left hip 09/01/2022   Need for pneumococcal 20-valent conjugate vaccination 08/22/2022   Rash 05/27/2022   Tremor 05/27/2022   Coronary artery disease involving native coronary artery of native heart without angina pectoris 09/14/2021   Pain due to onychomycosis of toenails of both feet 01/15/2021   Type 2 diabetes mellitus with diabetic dermatitis, without long-term current use of insulin (HCC) 01/15/2021   Palpitations 01/15/2018   Cardiac pacemaker in situ 01/15/2018   Heel spur, left 09/13/2017   Left knee pain 08/23/2017   CKD (chronic kidney disease), stage III (HCC) 12/04/2016   Chest pain 11/23/2015   Type I (juvenile type) diabetes mellitus with renal manifestations, not stated as uncontrolled(250.41) 04/27/2012   Insulin dependent diabetes mellitus with complications 04/27/2012   AKI (acute kidney injury) (HCC) 03/05/2012   Diarrhea 03/05/2012   Carotid artery disease (HCC) 08/11/2010   Left shoulder pain 07/08/2010   Preventative health care 07/08/2010   HOARSENESS 06/04/2010   Pruritus 05/11/2010   ANEMIA-NOS 04/02/2010   (HFpEF) heart failure with preserved ejection fraction (HCC) 02/11/2010   SINUS BRADYCARDIA 10/30/2009   Allergic rhinitis 09/11/2009   Backache 09/11/2009   MUSCLE STRAIN, RIGHT BUTTOCK 09/11/2009   Herpes zoster 05/11/2009   SHOULDER PAIN,  LEFT 12/29/2008   Proteinuria 12/12/2008   Abdominal pain 09/24/2007   Hx of CABG 05/03/2007   CHEST PAIN 04/20/2007   Dizziness 02/28/2007   Anxiety state 02/15/2007   GERD 02/15/2007   Gastroparesis 02/15/2007   DISC DISEASE, CERVICAL 02/15/2007   DISC DISEASE,  LUMBAR 02/15/2007   SPINAL STENOSIS, LUMBAR 02/15/2007   PERIPHERAL EDEMA 02/15/2007   Personal History of Other Diseases of Digestive Disease 02/15/2007   Hyperlipidemia LDL goal <70 01/01/2007   GOUT 01/01/2007   Morbid obesity (HCC) 01/01/2007   DEPRESSION 01/01/2007   PERIPHERAL VASCULAR DISEASE 01/01/2007   Diverticulosis of colon 01/01/2007   Osteoarthritis 01/01/2007   LOW BACK PAIN 01/01/2007   Disorder of bone and cartilage 01/01/2007   Essential hypertension 10/26/2006   Past Medical History:  Diagnosis Date   Allergic rhinitis    Anemia    Anxiety    Barrett esophagus    CAD (coronary artery disease) 2009   a. Multivessel s/p PCI w/DES 2009 // b. s/p CABG 2011  //  c. LHC 8/15: pLAD 95 ISR, LCx 100, pOM1 40, dRCA 100, S-OM1/OM2 ok, S-D1 ok, S-PDA ok, L-LAD ok, EF 60%   Carotid artery disease (HCC)    a. Carotid US 9/15: RICA 1-39%; LICA 40-59% >> FU 1 year  //  b. Carotid US 9/17: R 1-39%, L 40-59% >> FU 1 year   Chronic diastolic heart failure (HCC)    CKD (chronic kidney disease), stage II    GFR 60-89 ml/min   Depression    Disc disease, degenerative, cervical    Diverticulosis    Gastroparesis    GERD (gastroesophageal reflux disease)    Gout    H/O hiatal hernia    Helicobacter pylori gastritis    History of echocardiogram    a. Echo 11/13: EF 55% to 60%. Grade 2 diastolic dysfunction, MAC, trivial MR, mild LAE, normal RVSF, mild RAE, PASP 39 mmHg  //  b. 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   Type II or unspecified  type diabetes mellitus without mention of complication, not stated as uncontrolled    Past Surgical History:  Procedure Laterality Date   ABDOMINAL HYSTERECTOMY     CARDIAC CATHETERIZATION     2011  DR COOPER (APPT NEXT WEEK)   CHOLECYSTECTOMY     CORONARY ARTERY BYPASS GRAFT  2011   LIMA-LAD, SVG-DIAG, SVG-OM1-OM2, SVG-PDA   CORONARY STENT PLACEMENT     Drug-eluting stent to the left anterior descending, circumflex and right coronary artery in Jan 2009   EYE SURGERY     BIL CATARACT REMOVAL 06/2010   LEFT HEART CATHETERIZATION WITH CORONARY ANGIOGRAM N/A 12/02/2013   Procedure: LEFT HEART CATHETERIZATION WITH CORONARY ANGIOGRAM;  Surgeon: Lesleigh Noe, MD;  Location[facility] C[facility] LAB;  Service: Cardiovascular;  Laterality: N/A;   OVARIAN CYST REMOVAL     PACEMAKER INSERTION  03/06/12   MDT Adapta L implanted by Dr Johney Frame for SSS  PERMANENT PACEMAKER INSERTION N/A 03/06/2012   Procedure: PERMANENT PACEMAKER INSERTION;  Surgeon: Hillis Range, MD;  Location: [facility] CATH LAB;  Service: Cardiovascular;  Laterality: N/A;   SHOULDER ARTHROSCOPY  06/16/2011   Procedure: ARTHROSCOPY SHOULDER;  Surgeon: Kennieth Rad, MD;  Location: [facility] OR;  Service: Orthopedics;  Laterality: Left;  LEFT SHOULDER ARTHROSCOPY ACROMIALPLASTY, POSSIBLE MINI OPEN CUFF REPAIR    TUBAL LIGATION     Social History   Tobacco Use   Smoking status: Former    Current packs/day: 0.00    Average packs/day: 0.3 packs/day for 28.0 years (7.0 ttl pk-yrs)    Types: Cigarettes    Start date: 68    Quit date: 58    Years since quitting: 31.2   Smokeless tobacco: Never   Tobacco comments:    quit 30 yrs ago  Vaping Use   Vaping status: Never Used  Substance Use Topics   Alcohol use: No   Drug use: No   Social History   Socioeconomic History   Marital status: Widowed    Spouse name: Not on file   Number of children: Not on file   Years of education: Not on file   Highest education level: Not on file  Occupational  History   Occupation: RETIRED LPN    Comment: worked as [employer]  Tobacco Use   Smoking status: Former    Current packs/day: 0.00    Average packs/day: 0.3 packs/day for 28.0 years (7.0 ttl pk-yrs)    Types: Cigarettes    Start date: 67    Quit date: 14    Years since quitting: 31.2   Smokeless tobacco: Never   Tobacco comments:    quit 30 yrs ago  Vaping Use   Vaping status: Never Used  Substance and Sexual Activity   Alcohol use: No   Drug use: No   Sexual activity: Not on file  Other Topics Concern   Not on file  Social History Narrative   Widowed 2004.., Lives with daughter and grand son ,Family history is negative for premature coronary artery disease. Mother died at age 63 with heart disease in her later years, father died at age 67 from a stroke.Marland KitchenShe  has 8 siblings, none of whom have coronary artery disease.   Social Drivers of Corporate investment banker Strain: Low Risk  (11/02/2022)   Overall Financial Resource Strain (CARDIA)    Difficulty of Paying Living Expenses: Not hard at all  Food Insecurity: No Food Insecurity (06/26/2023)   Hunger Vital Sign    Worried About Running Out of Food in the Last Year: Never true    Ran Out of Food in the Last Year: Never true  Transportation Needs: No Transportation Needs (06/26/2023)   PRAPARE - Administrator, Civil Service (Medical): No    Lack of Transportation (Non-Medical): No  Physical Activity: Insufficiently Active (11/02/2022)   Exercise Vital Sign    Days of Exercise per Week: 5 days    Minutes of Exercise per Session: 20 min  Stress: No Stress Concern Present (11/02/2022)   [employer] of Occupational Health - Occupational Stress Questionnaire    Feeling of Stress : Only a little  Social Connections: Moderately Isolated (11/02/2022)   Social Connection and Isolation Panel [NHANES]    Frequency of Communication with Friends and Family: More than three times a week    Frequency of Social Gatherings  with Friends and Family: Once a week    Attends Religious Services:  More than 4 times per year    Active Member of Clubs or Organizations: No    Attends Banker Meetings: Never    Marital Status: Widowed  Intimate Partner Violence: Not At Risk (06/26/2023)   Humiliation, Afraid, Rape, and Kick questionnaire    Fear of Current or Ex-Partner: No    Emotionally Abused: No    Physically Abused: No    Sexually Abused: No   Family Status  Relation Name Status   Mother  Deceased       Old Age   Father  Deceased   Sister  Deceased   Sister  Deceased   Brother  Alive   Brother  Alive   Brother  Deceased   Brother  Deceased   Brother  Deceased   Brother  Deceased   MGM  Deceased   MGF  Deceased   PGM  Deceased   PGF  Deceased   Daughter  Corporate treasurer  Alive   Daughter  Alive   Son  Alive   Son  Deceased   Other  (Not Specified)  No partnership data on file   Family History  Problem Relation Age of Onset   Diabetes Mother    Hypertension Mother    Heart attack Mother    Stroke Father    Cancer Brother    Deafness Daughter    Other Son        cerebral edema at 18 months   Coronary artery disease Other    Allergies  Allergen Reactions   Ciprofloxacin Nausea And Vomiting and Other (See Comments)    Syncope, also    Codeine Other (See Comments)    HALLUCINATIONS   Hydrocodone Other (See Comments)    Made her pass out   Penicillins Hives   Shellfish Allergy Hives   Diltiazem Hcl Other (See Comments)    Low heart rate   Sulfonamide Derivatives Nausea And Vomiting   Morphine And Codeine Other (See Comments)    "Went crazy"   Lovastatin    Metformin Diarrhea      Review of Systems  Constitutional:  Negative for chills, fever and malaise/fatigue.  HENT:  Negative for congestion and hearing loss.   Eyes:  Negative for discharge.  Respiratory:  Negative for cough, sputum production and shortness of breath.   Cardiovascular:  Negative for chest  pain, palpitations and leg swelling.  Gastrointestinal:  Negative for abdominal pain, blood in stool, constipation, diarrhea, heartburn, nausea and vomiting.  Genitourinary:  Negative for dysuria, frequency, hematuria and urgency.  Musculoskeletal:  Negative for back pain, falls and myalgias.  Skin:  Negative for rash.  Neurological:  Negative for dizziness, sensory change, loss of consciousness, weakness and headaches.  Endo/Heme/Allergies:  Negative for environmental allergies. Does not bruise/bleed easily.  Psychiatric/Behavioral:  Negative for depression and suicidal ideas. The patient is not nervous/anxious and does not have insomnia.       Objective:     BP 100/82 (BP Location: Left Arm, Patient Position: Sitting, Cuff Size: Large)   Pulse 78   Temp 98 F (36.7 C) (Oral)   Resp 18   Ht 5\' 6"  (1.676 m)   SpO2 97%   BMI 31.05 kg/m  BP Readings from Last 3 Encounters:  06/29/23 100/82  04/17/23 134/75  04/14/23 (!) 150/62   Wt Readings from Last 3 Encounters:  04/17/23 192 lb 6 oz (87.3 kg)  04/14/23 186 lb (84.4 kg)  03/03/23 214 lb 14.4 oz (  97.5 kg)   SpO2 Readings from Last 3 Encounters:  06/29/23 97%  04/17/23 97%  04/14/23 100%      Physical Exam Vitals and nursing note reviewed.  Constitutional:      General: She is not in acute distress.    Appearance: Normal appearance. She is well-developed.  HENT:     Head: Normocephalic and atraumatic.  Eyes:     General: No scleral icterus.       Right eye: No discharge.        Left eye: No discharge.  Cardiovascular:     Rate and Rhythm: Normal rate and regular rhythm.     Heart sounds: No murmur heard. Pulmonary:     Effort: Pulmonary effort is normal. No respiratory distress.     Breath sounds: Normal breath sounds.  Musculoskeletal:        General: Normal range of motion.     Cervical back: Normal range of motion and neck supple.     Right lower leg: No edema.     Left lower leg: No edema.  Skin:     General: Skin is warm and dry.  Neurological:     Mental Status: She is alert and oriented to person, place, and time.  Psychiatric:        Mood and Affect: Mood normal.        Behavior: Behavior normal.        Thought Content: Thought content normal.        Judgment: Judgment normal.    No results found for any visits on 06/29/23.  Last CBC Lab Results  Component Value Date   WBC 8.1 06/29/2023   HGB 12.2 06/29/2023   HCT 36.1 06/29/2023   MCV 95.4 06/29/2023   MCH 31.7 09/02/2022   RDW 12.3 06/29/2023   PLT 189.0 06/29/2023   Last metabolic panel Lab Results  Component Value Date   GLUCOSE 195 (H) 04/17/2023   NA 140 04/17/2023   K 4.4 04/17/2023   CL 103 04/17/2023   CO2 27 04/17/2023   BUN 42 (H) 04/17/2023   CREATININE 1.37 (H) 04/17/2023   GFR 34.48 (L) 04/17/2023   CALCIUM 9.1 04/17/2023   PHOS 3.0 03/06/2012   PROT 6.6 04/17/2023   ALBUMIN 3.9 04/17/2023   BILITOT 0.6 04/17/2023   ALKPHOS 118 (H) 04/17/2023   AST 18 04/17/2023   ALT 21 04/17/2023   ANIONGAP 9 09/02/2022   Last lipids Lab Results  Component Value Date   CHOL 145 04/17/2023   HDL 69.40 04/17/2023   LDLCALC 58 04/17/2023   LDLDIRECT 82.5 09/11/2009   TRIG 86.0 04/17/2023   CHOLHDL 2 04/17/2023   Last hemoglobin A1c Lab Results  Component Value Date   HGBA1C 7.0 (H) 04/17/2023   Last thyroid functions Lab Results  Component Value Date   TSH 4.78 03/31/2022   Last vitamin D Lab Results  Component Value Date   VD25OH 41.98 03/31/2022   Last vitamin B12 and Folate Lab Results  Component Value Date   VITAMINB12 398 06/29/2023      The ASCVD Risk score (Arnett DK, et al., 2019) failed to calculate for the following reasons:   The 2019 ASCVD risk score is only valid for ages 54 to 59   Risk score cannot be calculated because patient has a medical history suggesting prior/existing ASCVD    Assessment & Plan:   Problem List Items Addressed This Visit   None Visit  Diagnoses  Tinea corporis    -  Primary   Relevant Medications   nystatin powder   clotrimazole-betamethasone (LOTRISONE) cream     Assessment and Plan    Intertrigo   Chronic intertrigo has been recurrent since starting Farxiga, causing nighttime discomfort. Previous treatments with nystatin cream and powder were not very effective, though the powder provided slightly better relief. Inadequate bathing may contribute to recurrence. Prescribe antifungal powder and arrange for assistance with bathing. Instruct her to use either cream or powder, not both.  Dizziness   Chronic dizziness worsens with movement, possibly due to orthostatic hypotension. Previous labs did not identify a clear cause. Diarrhea improved after stopping Tylenol, but dizziness persists. Order blood tests to investigate the etiology. Advise her to sit on the edge of the bed before standing and recommend discussing dizziness with her cardiologist at the next appointment.  Diabetes Mellitus   Type 2 diabetes is well-controlled with a recent A1c of 7. Marcelline Deist was discontinued due to adverse effects.  Muscle spasms   Muscle spasms are effectively managed with muscle relaxers. She reports running low on medication. Refill the prescription for the muscle relaxer.        No follow-ups on file.    Donato Schultz, DO

## 2023-06-29 NOTE — Patient Instructions (Signed)
 Body Ringworm  Body ringworm is an infection of the skin that often causes a ring-shaped rash. Body ringworm is also called tinea corporis.  Body ringworm can affect any part of your skin. This condition is easily spread from person to person (is very contagious).  What are the causes?  This condition is caused by fungi called dermatophytes. The condition develops when these fungi grow out of control on the skin.  You can get this condition if you touch a person or animal that has it. You can also get it if you share any items with an infected person or pet. These include:  Clothing, bedding, and towels.  Brushes or combs.  Gym equipment.  Any other object that has the fungus on it.  What increases the risk?  You are more likely to develop this condition if you:  Play sports that involve close physical contact, such as wrestling.  Sweat a lot.  Live in areas that are hot and humid.  Use public showers.  Have a weakened disease-fighting system (immune system).  What are the signs or symptoms?    Symptoms of this condition include:  Itchy, raised red spots and bumps.  Red scaly patches.  A ring-shaped rash. The rash may have:  A clear center.  Scales or red bumps at its center.  Redness near its borders.  Dry and scaly skin on or around it.  How is this diagnosed?  This condition can usually be diagnosed with a skin exam. A skin scraping may be taken from the affected area and examined under a microscope to see if the fungus is present.  How is this treated?  This condition may be treated with:  An antifungal cream or ointment.  An antifungal shampoo.  Antifungal medicines. These may be prescribed if your ringworm:  Is severe.  Keeps coming back or lasts a long time.  Follow these instructions at home:  Take over-the-counter and prescription medicines only as told by your health care provider.  If you were given an antifungal cream or ointment:  Use it as told by your health care provider.  Wash the infected area and  dry it completely before applying the cream or ointment.  If you were given an antifungal shampoo:  Use it as told by your health care provider.  Leave the shampoo on your body for 3-5 minutes before rinsing.  While you have a rash:  Wear loose clothing to stop clothes from rubbing and irritating it.  Wash or change your bed sheets every night.  Wash clothes and bed sheets in hot water.  Disinfect or throw out items that may be infected.  Wash your hands often with soap and water for at least 20 seconds. If soap and water are not available, use hand sanitizer.  If your pet has the same infection, take your pet to see a veterinarian for treatment.  How is this prevented?  Take a bath or shower every day and after every time you work out or play sports.  Dry your skin completely after bathing.  Wear sandals or shoes in public places and showers.  Wash athletic clothes after each use.  Do not share personal items with others.  Avoid touching red patches of skin on other people.  Avoid touching pets that have bald spots.  If you touch an animal that has a bald spot, wash your hands.  Contact a health care provider if:  Your rash continues to spread after 7 days of  treatment.  Your rash is not gone in 4 weeks.  The area around your rash gets red, warm, tender, and swollen.  This information is not intended to replace advice given to you by your health care provider. Make sure you discuss any questions you have with your health care provider.  Document Revised: 09/09/2021 Document Reviewed: 09/09/2021  Elsevier Patient Education  2024 [employer].

## 2023-06-30 LAB — CBC WITH DIFFERENTIAL/PLATELET
Basophils Absolute: 0.1 10*3/uL (ref 0.0–0.1)
Basophils Relative: 0.9 % (ref 0.0–3.0)
Eosinophils Absolute: 0.2 10*3/uL (ref 0.0–0.7)
Eosinophils Relative: 2.3 % (ref 0.0–5.0)
HCT: 36.1 % (ref 36.0–46.0)
Hemoglobin: 12.2 g/dL (ref 12.0–15.0)
Lymphocytes Relative: 16.4 % (ref 12.0–46.0)
Lymphs Abs: 1.3 10*3/uL (ref 0.7–4.0)
MCHC: 33.7 g/dL (ref 30.0–36.0)
MCV: 95.4 fl (ref 78.0–100.0)
Monocytes Absolute: 0.8 10*3/uL (ref 0.1–1.0)
Monocytes Relative: 10.4 % (ref 3.0–12.0)
Neutro Abs: 5.7 10*3/uL (ref 1.4–7.7)
Neutrophils Relative %: 70 % (ref 43.0–77.0)
Platelets: 189 10*3/uL (ref 150.0–400.0)
RBC: 3.78 Mil/uL — ABNORMAL LOW (ref 3.87–5.11)
RDW: 12.3 % (ref 11.5–15.5)
WBC: 8.1 10*3/uL (ref 4.0–10.5)

## 2023-06-30 LAB — URINE CULTURE
MICRO NUMBER:: 16226594
SPECIMEN QUALITY:: ADEQUATE

## 2023-06-30 LAB — VITAMIN B12: Vitamin B-12: 398 pg/mL (ref 211–911)

## 2023-07-01 ENCOUNTER — Encounter: Payer: Self-pay | Admitting: Family Medicine

## 2023-07-01 DIAGNOSIS — L97529 Non-pressure chronic ulcer of other part of left foot with unspecified severity: Secondary | ICD-10-CM | POA: Insufficient documentation

## 2023-07-01 DIAGNOSIS — G20C Parkinsonism, unspecified: Secondary | ICD-10-CM | POA: Insufficient documentation

## 2023-07-01 DIAGNOSIS — B354 Tinea corporis: Secondary | ICD-10-CM | POA: Insufficient documentation

## 2023-07-01 NOTE — Assessment & Plan Note (Signed)
 Well controlled, no changes to meds. Encouraged heart healthy diet such as the DASH diet and exercise as tolerated.

## 2023-07-01 NOTE — Assessment & Plan Note (Signed)
 Encourage heart healthy diet such as MIND or DASH diet, increase exercise, avoid trans fats, simple carbohydrates and processed foods, consider a krill or fish or flaxseed oil cap daily.

## 2023-07-01 NOTE — Assessment & Plan Note (Signed)
 Powder and cream sent in for pt Return to office prn

## 2023-07-01 NOTE — Assessment & Plan Note (Signed)
 Check labs  hgba1c acceptable, minimize simple carbs. Increase exercise as tolerated. Continue current meds Lab Results  Component Value Date   HGBA1C 7.0 (H) 04/17/2023

## 2023-07-05 ENCOUNTER — Telehealth: Payer: Self-pay

## 2023-07-07 NOTE — Progress Notes (Signed)
 Complex Care Management Note  Care Guide Note 07/07/2023 Name: [name] MRN: [id number] DOB: [birth year]  [name] is a 89 y.o. year old female who sees [name], Grayling Congress, DO for primary care. I reached out to [employer] by phone today to offer complex care management services.  [name] was given information about Complex Care Management services today including:   The Complex Care Management services include support from the care team which includes your Nurse Care Manager, Clinical Social Worker, or Pharmacist.  The Complex Care Management team is here to help remove barriers to the health concerns and goals most important to you. Complex Care Management services are voluntary, and the patient may decline or stop services at any time by request to their care team member.   Complex Care Management Consent Status: Patient agreed to services and verbal consent obtained.   Follow up plan:  Telephone appointment with complex care management team member scheduled for:  07/12/23 at 11:00 a.m.   Encounter Outcome:  Patient Scheduled  [facility]  [facility], [facility] Care Management Assistant Direct Dial: [phone]  Fax: [phone]

## 2023-07-10 ENCOUNTER — Ambulatory Visit: Payer: Medicare Other

## 2023-07-10 NOTE — Patient Outreach (Signed)
 Care Coordination   07/10/2023 Name: AHMANI DAOUD MRN: [id number] DOB: 26-Jan-1933   Care Coordination Outreach Attempts:  An unsuccessful outreach was attempted for an appointment today. Patient reports this is not a good time to talk and request RNCM call back another time.   Follow Up Plan: Message to care guide to request to reschedule telephone visit with new case manager, Ruel Favors, RNCM. Update provided to L. Julian Reil.  Encounter Outcome:  Patient Request to Call Back   Care Coordination Interventions:  Yes, provided    Kathyrn Sheriff, RN, MSN, BSN, CCM [place]  [facility], Population Health Case Manager Phone: [phone]

## 2023-07-11 ENCOUNTER — Ambulatory Visit (HOSPITAL_BASED_OUTPATIENT_CLINIC_OR_DEPARTMENT_OTHER)
Admission: RE | Admit: 2023-07-11 | Discharge: 2023-07-11 | Disposition: A | Discharge status: Home / Self Care | Source: Ambulatory Visit | Attending: Physician Assistant | Admitting: Physician Assistant

## 2023-07-11 ENCOUNTER — Ambulatory Visit (INDEPENDENT_AMBULATORY_CARE_PROVIDER_SITE_OTHER): Admitting: Physician Assistant

## 2023-07-11 ENCOUNTER — Encounter: Payer: Self-pay | Admitting: Physician Assistant

## 2023-07-11 ENCOUNTER — Ambulatory Visit: Admitting: Family Medicine

## 2023-07-11 VITALS — BP 156/59 | HR 61 | Temp 97.7°F

## 2023-07-11 DIAGNOSIS — R051 Acute cough: Secondary | ICD-10-CM | POA: Insufficient documentation

## 2023-07-11 DIAGNOSIS — R062 Wheezing: Secondary | ICD-10-CM

## 2023-07-11 DIAGNOSIS — R0789 Other chest pain: Secondary | ICD-10-CM | POA: Diagnosis not present

## 2023-07-11 DIAGNOSIS — R069 Unspecified abnormalities of breathing: Secondary | ICD-10-CM | POA: Diagnosis not present

## 2023-07-11 DIAGNOSIS — R059 Cough, unspecified: Secondary | ICD-10-CM | POA: Diagnosis not present

## 2023-07-11 LAB — POC COVID19 BINAXNOW: SARS Coronavirus 2 Ag: NEGATIVE

## 2023-07-11 LAB — POCT INFLUENZA A/B
Influenza A, POC: NEGATIVE
Influenza B, POC: NEGATIVE

## 2023-07-11 MED ORDER — ALBUTEROL SULFATE HFA 108 (90 BASE) MCG/ACT IN AERS: 2.0000 | INHALATION_SPRAY | Freq: Four times a day (QID) | RESPIRATORY_TRACT | 2 refills | Status: DC | PRN

## 2023-07-11 MED ORDER — DOXYCYCLINE HYCLATE 100 MG PO TABS: 100.0000 mg | ORAL_TABLET | Freq: Two times a day (BID) | ORAL | 0 refills | Status: AC

## 2023-07-11 MED ORDER — ALBUTEROL SULFATE (2.5 MG/3ML) 0.083% IN NEBU
2.5000 mg | INHALATION_SOLUTION | Freq: Once | RESPIRATORY_TRACT | Status: AC
  Administered 2023-07-11: 2.5 mg via RESPIRATORY_TRACT

## 2023-07-11 MED ORDER — PREDNISONE 20 MG PO TABS: 20.0000 mg | ORAL_TABLET | Freq: Every day | ORAL | 0 refills | Status: DC

## 2023-07-11 NOTE — Progress Notes (Signed)
 Established patient visit   Patient: [name]   DOB: 03-30-1933   89 y.o. Female  MRN: [id number] Visit Date: 07/11/2023  Today's healthcare provider: Alfredia Ferguson, PA-C   Cc. Cough, congestion  Subjective     Pt reports cough, congestion, fatigue x 1 week. Unable to sleep well 2/2 cough. Some worsening peripheral edema.   Medications: Outpatient Medications Prior to Visit  Medication Sig   ACCU-CHEK GUIDE test strip CHECK GLUCOSE IN THE MORNING AT  NOON AND AT BEDTIME   Accu-Chek Softclix Lancets lancets CHECK GLUCOSE IN THE MORNING AT  NOON AND AT BEDTIME   acetaminophen (TYLENOL) 500 MG tablet Take 500 mg by mouth every 6 (six) hours as needed for headache (pain).   allopurinol (ZYLOPRIM) 100 MG tablet Take 1 tablet (100 mg total) by mouth at bedtime.   amLODipine (NORVASC) 5 MG tablet TAKE 1 TABLET BY MOUTH TWICE  DAILY   atorvastatin (LIPITOR) 20 MG tablet TAKE 1 TABLET BY MOUTH ONCE  DAILY   azelastine (OPTIVAR) 0.05 % ophthalmic solution Place 1 drop into both eyes 2 (two) times daily.   Blood Glucose Monitoring Suppl DEVI 1 each by Does not apply route in the morning, at noon, and at bedtime. May substitute to any manufacturer covered by patient's insurance.   carvedilol (COREG) 12.5 MG tablet TAKE 1 TABLET BY MOUTH TWICE  DAILY   clopidogrel (PLAVIX) 75 MG tablet TAKE 1 TABLET BY MOUTH DAILY   clotrimazole-betamethasone (LOTRISONE) cream Apply 1 Application topically daily.   clotrimazole-betamethasone (LOTRISONE) cream Apply 1 Application topically daily.   dicyclomine (BENTYL) 10 MG capsule Take 1 capsule (10 mg total) by mouth 4 (four) times daily -  before meals and at bedtime. (Patient not taking: Reported on 06/29/2023)   dicyclomine (BENTYL) 10 MG capsule Take 1 capsule (10 mg total) by mouth 4 (four) times daily -  before meals and at bedtime. (Patient not taking: Reported on 06/29/2023)   dicyclomine (BENTYL) 10 MG capsule Take 1 capsule (10 mg total)  by mouth 3 (three) times daily before meals. (Patient not taking: Reported on 06/29/2023)   ENTRESTO 97-103 MG TAKE 1 TABLET BY MOUTH TWICE  DAILY   furosemide (LASIX) 40 MG tablet Take 1 tablet by mouth once daily   Insulin Pen Needle (BD PEN NEEDLE MICRO U/F) 32G X 6 MM MISC To inject Humalin   methocarbamol (ROBAXIN) 500 MG tablet Take 1 tablet (500 mg total) by mouth every 6 (six) hours as needed for muscle spasms.   Multiple Vitamin (MULTIVITAMIN WITH MINERALS) TABS tablet Take 1 tablet by mouth at bedtime.    nitroGLYCERIN (NITROSTAT) 0.4 MG SL tablet DISSOLVE ONE TABLET UNDER THE TONGUE EVERY 5 MINUTES AS NEEDED FOR CHEST PAIN.  DO NOT EXCEED A TOTAL OF 3 DOSES IN 15 MINUTES   NONFORMULARY OR COMPOUNDED ITEM Compression socks 20-30 mm/hg dx low ext edema   nystatin cream (MYCOSTATIN) Apply 1 Application topically 2 (two) times daily.   nystatin powder Apply 1 Application topically 3 (three) times daily.   ONE TOUCH ULTRA TEST test strip 1 each by Other route daily as needed (blood sugar).    pantoprazole (PROTONIX) 40 MG tablet Take 1 tablet (40 mg total) by mouth daily.   potassium chloride (KLOR-CON) 10 MEQ tablet Take 1 tablet (10 mEq total) by mouth daily. (Patient taking differently: Take 10 mEq by mouth every Monday, Wednesday, and Friday.)   tobramycin (TOBREX) 0.3 % ophthalmic solution Place 1  drop into the left eye 4 (four) times daily.   Facility-Administered Medications Prior to Visit  Medication Dose Route Frequency Provider   betamethasone acetate-betamethasone sodium phosphate (CELESTONE) injection 3 mg  3 mg Intramuscular Once Felecia Shelling, DPM    Review of Systems  Constitutional:  Positive for fatigue. Negative for fever.  Respiratory:  Positive for cough, shortness of breath and wheezing.   Cardiovascular:  Negative for chest pain and leg swelling.  Gastrointestinal:  Negative for abdominal pain.  Neurological:  Negative for dizziness and headaches.        Objective    BP (!) 156/59   Pulse 61   Temp 97.7 F (36.5 C)   SpO2 98%    Physical Exam Constitutional:      General: She is awake.     Appearance: She is well-developed.  HENT:     Head: Normocephalic.  Eyes:     Conjunctiva/sclera: Conjunctivae normal.  Cardiovascular:     Rate and Rhythm: Normal rate and regular rhythm.     Heart sounds: Normal heart sounds.  Pulmonary:     Effort: Pulmonary effort is normal.     Breath sounds: Wheezing, rhonchi and rales present.  Musculoskeletal:     Right lower leg: Edema present.     Left lower leg: Edema present.     Comments: Significant peripheral edema b/l LE, but only 0-1+ pitting edema  Skin:    General: Skin is warm.  Neurological:     Mental Status: She is alert and oriented to person, place, and time.  Psychiatric:        Attention and Perception: Attention normal.        Mood and Affect: Mood normal.        Speech: Speech normal.        Behavior: Behavior is cooperative.     Results for orders placed or performed in visit on 07/11/23  POCT Influenza A/B  Result Value Ref Range   Influenza A, POC Negative Negative   Influenza B, POC Negative Negative  POC COVID-19 BinaxNow  Result Value Ref Range   SARS Coronavirus 2 Ag Negative Negative    Assessment & Plan    Acute cough -     Doxycycline Hyclate; Take 1 tablet (100 mg total) by mouth 2 (two) times daily for 7 days.  Dispense: 14 tablet; Refill: 0 -     DG Chest 2 View; Future -     POCT Influenza A/B -     POC COVID-19 BinaxNow -     predniSONE; Take 1 tablet (20 mg total) by mouth daily with breakfast.  Dispense: 5 tablet; Refill: 0  Wheezing -     Albuterol Sulfate -     POCT Influenza A/B -     POC COVID-19 BinaxNow -     predniSONE; Take 1 tablet (20 mg total) by mouth daily with breakfast.  Dispense: 5 tablet; Refill: 0 -     Albuterol Sulfate HFA; Inhale 2 puffs into the lungs every 6 (six) hours as needed for wheezing or shortness of breath.   Dispense: 8 g; Refill: 2   Poc covid, flu negative. Breathing tx admin today, post tx only moderate wheezing b/l.  Rx doxy, prednisone, albuterol inhaler. Ordering chest xray r/o pneumonia , effusion from chf exacerbation  Return if symptoms worsen or fail to improve.       Alfredia Ferguson, PA-C  [facility] at [facility] [phone]  phone) [phone] (fax)  [facility]

## 2023-07-12 ENCOUNTER — Ambulatory Visit: Payer: Self-pay | Admitting: Licensed Clinical Social Worker

## 2023-07-12 NOTE — Patient Outreach (Signed)
 Care Coordination   07/12/2023 Name: [name] MRN: [id number] DOB: 21-Oct-1932   Care Coordination Outreach Attempts:  An unsuccessful outreach was attempted for an appointment today.  Follow Up Plan:  Additional outreach attempts will be made to offer the patient complex care management information and services.   Encounter Outcome:  No Answer   Care Coordination Interventions:  No, not indicated    Jeanie Cooks, PhD [facility], [facility] Social Worker Direct Dial: [phone]  Fax: [phone]

## 2023-07-17 NOTE — Progress Notes (Signed)
Patient notified of results and provider's recomendations

## 2023-07-18 ENCOUNTER — Telehealth: Payer: Self-pay

## 2023-07-18 NOTE — Progress Notes (Signed)
 Complex Care Management Note  Care Guide Note 07/18/2023 Name: KATERYN MARASIGAN MRN: [id number] DOB: 26-Sep-1932  [name] Begue is a 89 y.o. year old female who sees [name], Grayling Congress, DO for primary care. I reached out to [employer] by phone today to offer complex care management services.  Ms. Murdy was given information about Complex Care Management services today including:   The Complex Care Management services include support from the care team which includes your Nurse Care Manager, Clinical Social Worker, or Pharmacist.  The Complex Care Management team is here to help remove barriers to the health concerns and goals most important to you. Complex Care Management services are voluntary, and the patient may decline or stop services at any time by request to their care team member.   Complex Care Management Consent Status: Patient agreed to services and verbal consent obtained.   Follow up plan:  Telephone appointment with complex care management team member scheduled for:  07/24/23 at 11:00 a.m.   Encounter Outcome:  Patient Scheduled  [facility]  [facility] Care Management Assistant Direct Dial: [phone]  Fax: [phone]

## 2023-07-24 ENCOUNTER — Other Ambulatory Visit: Payer: Self-pay | Admitting: Licensed Clinical Social Worker

## 2023-07-25 ENCOUNTER — Other Ambulatory Visit: Payer: Self-pay | Admitting: Family Medicine

## 2023-07-25 DIAGNOSIS — R1013 Epigastric pain: Secondary | ICD-10-CM

## 2023-07-28 ENCOUNTER — Other Ambulatory Visit: Payer: Self-pay | Admitting: Family Medicine

## 2023-07-28 ENCOUNTER — Other Ambulatory Visit: Payer: Self-pay

## 2023-07-28 MED ORDER — PANTOPRAZOLE SODIUM 40 MG PO TBEC: 40.0000 mg | DELAYED_RELEASE_TABLET | Freq: Every day | ORAL | 3 refills | Status: AC

## 2023-07-28 NOTE — Patient Outreach (Addendum)
 Complex Care Management   Visit Note  07/28/2023  Name:  [name] MRN: [id number] DOB: April 15, 1932  Situation: Referral received for Complex Care Management related to Heart Failure, Diabetes with Complications, SDOH Barriers:  in home bath aide, and HNT  I obtained verbal consent from Patient.  Visit completed with patient  on the phone  Background:   Past Medical History:  Diagnosis Date   Allergic rhinitis    Anemia    Anxiety    Barrett esophagus    CAD (coronary artery disease) 2009   a. Multivessel s/p PCI w/DES 2009 // b. s/p CABG 2011  //  c. LHC 8/15: pLAD 95 ISR, LCx 100, pOM1 40, dRCA 100, S-OM1/OM2 ok, S-D1 ok, S-PDA ok, L-LAD ok, EF 60%   Carotid artery disease (HCC)    a. Carotid US  9/15: RICA 1-39%; LICA 40-59% >> FU 1 year  //  b. Carotid US  9/17: R 1-39%, L 40-59% >> FU 1 year   Chronic diastolic heart failure (HCC)    CKD (chronic kidney disease), stage II    GFR 60-89 ml/min   Depression    Disc disease, degenerative, cervical    Diverticulosis    Gastroparesis    GERD (gastroesophageal reflux disease)    Gout    H/O hiatal hernia    Helicobacter pylori gastritis    History of echocardiogram    a. Echo 11/13: EF 55% to 60%. Grade 2 diastolic dysfunction, MAC, trivial MR, mild LAE, normal RVSF, mild RAE, PASP 39 mmHg  //  b. 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   Type II or unspecified type diabetes mellitus without mention of complication, not stated as uncontrolled     Assessment: Patient Reported  Symptoms:  Cognitive Cognitive Status: Alert and oriented to person, place, and time      Neurological Neurological Review of Symptoms: No symptoms reported    HEENT HEENT Symptoms Reported: Runny nose (chronic)      Cardiovascular Cardiovascular Symptoms Reported: No symptoms reported, Swelling in legs or feet (has no working scale) Cardiovascular Conditions: Coronary artery disease, Heart failure, Hypertension Cardiovascular Management Strategies: Adequate rest, Medication therapy, Routine screening, Diet modification, Weight management Do You Have a Working Readable Scale?: No (referral to SW) Weight: 183 lb 9.6 oz (83.3 kg)  Respiratory Respiratory Symptoms Reported: Productive cough Additional Respiratory Details: recent URI, reports symptoms improving with treatment Respiratory Conditions: Cough Respiratory Self-Management Outcome: 3 (uncertain)  Endocrine Is patient diabetic?: Yes (A1C 7) Is patient checking blood sugars at home?: Yes Endocrine Management Strategies: Medical device, Medication therapy, Diet modification, Weight management, Routine screening Endocrine Self-Management Outcome: 4 (good)  Gastrointestinal Gastrointestinal Symptoms Reported: No symptoms reported   Nutrition Risk Screen (CP): No indicators present  Genitourinary Genitourinary Symptoms Reported: No symptoms reported    Integumentary Integumentary Symptoms Reported: No symptoms reported    Musculoskeletal Musculoskelatal Symptoms Reviewed: Difficulty walking, Unsteady gait Additional Musculoskeletal Details: uses 4WW for ambulation Musculoskeletal Conditions: Back  pain, Mobility limited Musculoskeletal Management Strategies: Medical device, Weight management, Diet modification, Coping strategies Musculoskeletal Self-Management Outcome: 3 (uncertain) Falls in the past year?: No Patient at Risk for Falls Due to: Impaired balance/gait, Impaired mobility  Psychosocial Psychosocial Symptoms Reported:  Depression - if selected complete PHQ 2-9 Additional Psychological Details: patient expresses depression Behavioral Health Comment: patient declined LCSW referral Major Change/Loss/Stressor/Fears (CP): Medical condition, self Quality of Family Relationships: helpful, involved, supportive Do you feel physically threatened by others?: No      01/02/2023    1:05 PM  Depression screen PHQ 2/9  Decreased Interest 0  Down, Depressed, Hopeless 0  PHQ - 2 Score 0    There were no vitals filed for this visit.  Medications Reviewed Today     Reviewed by Lonzell Planas, RN (Registered Nurse) on 07/28/23 at 1133  Med List Status: <None>   Medication Order Taking? Sig Documenting Provider Last Dose Status Informant  ACCU-CHEK GUIDE test strip [id number] Yes CHECK GLUCOSE IN THE MORNING AT  [facility] AND AT BEDTIME Antonio Cyndee Jamee JONELLE, DO Taking Active   Accu-Chek Softclix Lancets lancets [id number] Yes CHECK GLUCOSE IN THE MORNING AT  [facility] AND AT BEDTIME Antonio Cyndee Jamee JONELLE, DO Taking Active   acetaminophen  (TYLENOL ) 500 MG tablet [id number] Yes Take 500 mg by mouth every 6 (six) hours as needed for headache (pain). [provider] Taking Active Self, Family Member, Pharmacy Records  albuterol  (VENTOLIN  HFA) 108 [phone] Base) MCG/ACT inhaler [id number] Yes Inhale 2 puffs into the lungs every 6 (six) hours as needed for wheezing or shortness of breath. Cyndi Shaver, PA-C Taking Active   allopurinol  (ZYLOPRIM ) 100 MG tablet [id number] Yes Take 1 tablet (100 mg total) by mouth at bedtime. Antonio Cyndee, Jamee R, DO Taking Active   amLODipine  (NORVASC ) 5 MG tablet [id number] Yes TAKE 1 TABLET BY MOUTH TWICE  DAILY Antonio Cyndee, Yvonne R, DO Taking Active   atorvastatin  (LIPITOR) 20 MG tablet [id number] Yes TAKE 1 TABLET BY MOUTH ONCE  DAILY Wonda Sharper, MD Taking Active   azelastine (OPTIVAR) 0.05 % ophthalmic solution [id number] Yes Place 1 drop into both eyes 2 (two) times daily. [provider] Taking Active Self, Family Member, Pharmacy Records           Med Note BEVERLEE, UTAH W   Mon Nov 23, 2015  1:44 AM)    betamethasone  acetate-betamethasone  sodium phosphate (CELESTONE ) injection 3 mg [id number]   Janit Thresa HERO, DPM  Active            Med Note IRVIN, DION KANDICE Kitchens May 07, 2018 10:26 AM)    Blood Glucose Monitoring Suppl DEVI [id number] Yes 1 each by Does not apply route in the morning, at noon, and at bedtime. May substitute to any manufacturer covered by patient's insurance. Antonio Cyndee, Jamee R, DO Taking Active   carvedilol  (COREG ) 12.5 MG tablet [id number] Yes TAKE 1 TABLET BY MOUTH TWICE  DAILY Wonda Sharper, MD Taking Active   clopidogrel  (PLAVIX ) 75 MG tablet [id number] Yes TAKE 1 TABLET BY MOUTH DAILY Wonda Sharper, MD Taking Active   clotrimazole -betamethasone  (LOTRISONE ) cream [id number] Yes Apply 1 Application topically daily. Lowne Chase, Yvonne R, DO Taking Active   clotrimazole -betamethasone  (LOTRISONE ) cream [id number] Yes Apply 1 Application topically daily. Antonio Cyndee Jamee JONELLE, DO Taking Active   dicyclomine  (BENTYL ) 10 MG capsule [id number] No Take 1 capsule (10 mg total) by mouth 4 (four) times daily -  before meals and at bedtime.  Patient not taking: Reported on 05/22/2023   Cyndi Shaver, PA-C Not Taking Active   dicyclomine  (BENTYL ) 10 MG capsule [id number] No Take 1 capsule (10 mg total) by mouth 4 (four) times daily -  before meals and at bedtime.  Patient not taking: Reported on 05/22/2023   Antonio Cyndee Jamee JONELLE, DO Not Taking Active   dicyclomine  (BENTYL ) 10 MG capsule [id number] No Take 1 capsule (10 mg total) by mouth 3 (three) times daily before meals.  Patient not taking: Reported on 05/22/2023   Antonio Cyndee Jamee JONELLE, DO Not Taking Active   ENTRESTO  97-103 MG [id number] Yes TAKE 1 TABLET BY MOUTH TWICE  DAILY Wonda Sharper, MD Taking Active   furosemide  (LASIX ) 40 MG tablet [id number] Yes Take 1 tablet by mouth once daily Wonda Sharper, MD  Taking Active            Med Note DANICE, Dajsha Massaro   Fri Jul 28, 2023 11:27 AM) Patient does not take on Saturdays because she goes to church  Insulin  Pen Needle (BD PEN NEEDLE MICRO U/F) 32G X 6 MM MISC [id number] Yes To inject Humalin Lowne Chase, Yvonne R, DO Taking Active Self, Family Member, Pharmacy Records  methocarbamol  (ROBAXIN ) 500 MG tablet [id number] Yes Take 1 tablet (500 mg total) by mouth every 6 (six) hours as needed for muscle spasms. Lowne Chase, Yvonne R, DO Taking Active   Multiple Vitamin (MULTIVITAMIN WITH MINERALS) TABS tablet [id number] Yes Take 1 tablet by mouth at bedtime.  [provider] Taking Active Self, Family Member, Pharmacy Records  nitroGLYCERIN  (NITROSTAT ) 0.4 MG SL tablet [id number] Yes DISSOLVE ONE TABLET UNDER THE TONGUE EVERY 5 MINUTES AS NEEDED FOR CHEST PAIN.  DO NOT EXCEED A TOTAL OF 3 DOSES IN 15 MINUTES Kathrine, Jeoffrey POUR, NP Taking Active Self, Family Member, Pharmacy Records  NONFORMULARY OR COMPOUNDED ITEM [id number] Yes Compression socks 20-30 mm/hg dx low ext edema Antonio Cyndee, Yvonne R, DO Taking Active   nystatin  cream (MYCOSTATIN ) [id number] Yes Apply 1 Application topically 2 (two) times daily. Vincente Shivers, NP Taking Active   nystatin  powder [id number] Yes Apply 1 Application topically 3 (three) times daily. Antonio Cyndee Jamee R, DO Taking Active   ONE TOUCH ULTRA TEST test strip [id number] Yes 1 each by Other route daily as needed (blood sugar).  [provider] Taking Active Self, Family Member, Pharmacy Records           Med Note [facility], RACHEL A   Sun Aug 03, 2014  9:23 AM)     pantoprazole  (PROTONIX ) 40 MG tablet [id number] No Take 1 tablet (40 mg total) by mouth daily.  Patient not taking: Reported on 07/28/2023   Lue Elsie BROCKS, MD Not Taking Active            Med Note DANICE, Fany Cavanaugh   Fri Jul 28, 2023 11:32 AM) Refill refused by [facility]  potassium chloride  (KLOR-CON ) 10 MEQ tablet [id number] Yes Take 1 tablet (10 mEq total) by  mouth daily.  Patient taking differently: Take 10 mEq by mouth every Monday, Wednesday, and Friday.   Lelon Glendia DASEN, PA-C Taking Active Self, Family Member, Pharmacy Records           Med Note KARLYNN, HEDDY CHRISTELLA Kitchens May 22, 2023 11:19 AM) 05/22/23 reports takes every day   predniSONE  (DELTASONE ) 20 MG tablet [id number] No Take 1 tablet (20 mg total) by mouth daily with breakfast.  Patient not taking: Reported on 07/28/2023   Cyndi Shaver, PA-C  Not Taking Active            Med Note DANICE, Satvik Parco   Fri Jul 28, 2023 11:32 AM) Patient reports completed course  tobramycin (TOBREX) 0.3 % ophthalmic solution [id number] Yes Place 1 drop into the left eye 4 (four) times daily. [provider] Taking Active Self, Family Member, Pharmacy Records           Med Note CATHY, PAULA   Wed Nov 02, 2022 10:42 AM) Only takes when she gets a treatment to her eye            Recommendation:   Patient to contact insurance company to obtain new weight scale and BP cuff.  Patient to work with BSW regarding need for bath aide.   Message sent to PCP to request refill of Pantoprazole  be sent to [facility] pharmacy as per patient request.  Follow Up Plan:   Telephone follow up appointment date/time:  08/08/23 at 10:15   Olam Idol BSN RN CCM [place]  [facility], [facility] RN Care Manager Direct Dial: [phone] Fax: [phone]

## 2023-07-28 NOTE — Patient Instructions (Signed)
 Visit Information  Thank you for taking time to visit with me today. Please don't hesitate to contact me if I can be of assistance to you before our next scheduled appointment.  Our next appointment is by telephone on 08/08/23 at 10:15 Please call the care guide team at [phone] if you need to cancel or reschedule your appointment.   Following is a copy of your care plan:   Goals Addressed             This Visit's Progress    VBCI RN Care Plan       Problems:  Care Coordination needs related to obtaining needed medical equipment Chronic Disease Management support and education needs related to CHF, DMII, and HTN   Goal: Over the next 7 days the Patient will attend all scheduled medical appointments: take all medication as directed, call insurance to obtain new BP cuff and weight scale, take blood sugar in the morning prior to eating breakfast as evidenced by patient report        continue to work with Medical illustrator and/or Social Worker to address care management and care coordination needs related to CHF, HTN, and need for in home bath aide as evidenced by adherence to care management team scheduled appointments     demonstrate Improved adherence to prescribed treatment plan for CHF, DMII, and HTN as evidenced by taking daily fasting blood sugar, weight and BP readings daily and keeping a log of results demonstrate Improved health management independence as evidenced by completing call to insurance company to obtain new BP cuff and weight scale as instructed by RNCM          Interventions:   Heart Failure Interventions: Provided education on low sodium diet Assessed need for readable accurate scales in home Advised patient to weigh each morning after emptying bladder Discussed importance of daily weight and advised patient to weigh and record daily Reviewed role of diuretics in prevention of fluid overload and management of heart failure; Discussed the importance of keeping  all appointments with provider Assessed social determinant of health barriers   Diabetes Interventions: Assessed patient's understanding of A1c goal: <7% Provided education to patient about basic DM disease process Reviewed medications with patient and discussed importance of medication adherence Counseled on importance of regular laboratory monitoring as prescribed Discussed plans with patient for ongoing care management follow up and provided patient with direct contact information for care management team Reviewed scheduled/upcoming provider appointments including: 07/31/23 with VBCI BSW, 08/01/23 PM check Advised patient, providing education and rationale, to check cbg fasting and record, calling PCP for findings outside established parameters Review of patient status, including review of consultants reports, relevant laboratory and other test results, and medications completed Lab Results  Component Value Date   HGBA1C 7.0 (H) 04/17/2023    Hypertension Interventions: Last practice recorded BP readings:  BP Readings from Last 3 Encounters:  07/11/23 (!) 156/59  06/29/23 100/82  04/17/23 134/75   Most recent eGFR/CrCl:  Lab Results  Component Value Date   EGFR 32 (L) 05/17/2022    No components found for: "CRCL"  Evaluation of current treatment plan related to hypertension self management and patient's adherence to plan as established by provider Reviewed medications with patient and discussed importance of compliance Provided assistance with obtaining home blood pressure monitor via [employer] member servivces; Discussed plans with patient for ongoing care management follow up and provided patient with direct contact information for care management team Advised patient, providing education  and rationale, to monitor blood pressure daily and record, calling PCP for findings outside established parameters Reviewed scheduled/upcoming provider appointments including:   Assessed social determinant of health barriers  Patient Self-Care Activities:  Attend all scheduled provider appointments Call pharmacy for medication refills 3-7 days in advance of running out of medications Call provider office for new concerns or questions  Perform all self care activities independently  Take medications as prescribed    Plan:  Telephone follow up appointment with care management team member scheduled for:  08/08/2023 at 10:15             Please call the Suicide and Crisis Lifeline: 988 call the [place]  National Suicide Prevention Lifeline: 816-611-9798 or TTY: 941 809 0104 TTY 223-022-8880) to talk to a trained counselor call 1-800-273-TALK (toll free, 24 hour hotline) if you are experiencing a Mental Health or Behavioral Health Crisis or need someone to talk to.  The patient verbalized understanding of instructions, educational materials, and care plan provided today and DECLINED offer to receive copy of patient instructions, educational materials, and care plan.    Clarnce Crow BSN RN CCM [place]  [facility], [facility] RN Care Manager Direct Dial: [phone] Fax: [phone]

## 2023-07-31 ENCOUNTER — Other Ambulatory Visit: Payer: Self-pay | Admitting: Licensed Clinical Social Worker

## 2023-07-31 NOTE — Patient Instructions (Signed)
 Visit Information  Thank you for taking time to visit with me today. Please don't hesitate to contact me if I can be of assistance to you before our next scheduled appointment.  Our next appointment is by telephone on 08/14/2023 at 9:30 am Please call the care guide team at [phone] if you need to cancel or reschedule your appointment.   Following is a copy of your care plan:   Goals Addressed             This Visit's Progress    Care Coordination Activities       Care Coordination Interventions: Patient stated that she is still in need of a HHA to come in the home and assist with baths. Patient stated that someone came out to do an evaluation and was supposed to call on this past Friday but no one called, and hopes to get a call on today or this week. SW did call [facility], because that is who the patient thought  cam out to do the evaluation. [facility]) stated that they were in on 04/20/2023 and discharged her on 06/15/2023 and have not received any other orders and that they are short staffed anyway. SW did let the patient know the conversation with [facility] and will follow up with PCP to see if another order was written. SW will also follow up with patient to see if she receives a phone call back from anyone who came and did the evaluation. Follow Up 08/14/2023 at 9:30 am        Please call the Suicide and Crisis Lifeline: 988 go to [facility] [address], [place] [phone]) call 911 if you are experiencing a Mental Health or Behavioral Health Crisis or need someone to talk to.  The patient verbalized understanding of instructions, educational materials, and care plan provided today and DECLINED offer to receive copy of patient instructions, educational materials, and care plan.   Jonda Neighbours, PhD [facility], [facility] Social Worker Direct Dial: [phone]  Fax: [phone]

## 2023-07-31 NOTE — Patient Outreach (Signed)
 Complex Care Management   Visit Note  07/31/2023  Name:  [name] MRN: [id number] DOB: February 05, 1933  Situation: Referral received for Complex Care Management related to  [facility] for bathing  I obtained verbal consent from Patient.  Visit completed with patient  on the phone  Background:   Past Medical History:  Diagnosis Date   Allergic rhinitis    Anemia    Anxiety    Barrett esophagus    CAD (coronary artery disease) 2009   a. Multivessel s/p PCI w/DES 2009 // b. s/p CABG 2011  //  c. LHC 8/15: pLAD 95 ISR, LCx 100, pOM1 40, dRCA 100, S-OM1/OM2 ok, S-D1 ok, S-PDA ok, L-LAD ok, EF 60%   Carotid artery disease (HCC)    a. Carotid US  9/15: RICA 1-39%; LICA 40-59% >> FU 1 year  //  b. Carotid US  9/17: R 1-39%, L 40-59% >> FU 1 year   Chronic diastolic heart failure (HCC)    CKD (chronic kidney disease), stage II    GFR 60-89 ml/min   Depression    Disc disease, degenerative, cervical    Diverticulosis    Gastroparesis    GERD (gastroesophageal reflux disease)    Gout    H/O hiatal hernia    Helicobacter pylori gastritis    History of echocardiogram    a. Echo 11/13: EF 55% to 60%. Grade 2 diastolic dysfunction, MAC, trivial MR, mild LAE, normal RVSF, mild RAE, PASP 39 mmHg  //  b. 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  Medications Reviewed Today   Medications were not reviewed in this encounter     Recommendation:   Home Health requests: assist with bathing HHA  Follow Up Plan:   Telephone follow up appointment date/time:  08/14/2023 at 9:30 am  Jonda Neighbours, PhD [facility], [facility] Social Worker Direct Dial: [phone]  Fax: [phone]

## 2023-08-04 ENCOUNTER — Ambulatory Visit: Admitting: Podiatrist

## 2023-08-04 ENCOUNTER — Encounter: Payer: Self-pay | Admitting: Podiatrist

## 2023-08-04 DIAGNOSIS — L89611 Pressure ulcer of right heel, stage 1: Secondary | ICD-10-CM | POA: Diagnosis not present

## 2023-08-04 NOTE — Progress Notes (Signed)
 Chief Complaint  Patient presents with   Plantar Fasciitis    RM#2 patient states has history of plantar fasciitis right foot has had previous inject which not much relief and site has some discoloration.      HPI: Patient is 89 y.o. female who presents today for pain in the right heel.  She states that she has pain when she lays in bed at night and her heel hits the mattress.  She has tried no treatment.      Allergies  Allergen Reactions   Ciprofloxacin  Nausea And Vomiting and Other (See Comments)    Syncope, also    Codeine Other (See Comments)    HALLUCINATIONS   Hydrocodone Other (See Comments)    Made her pass out   Penicillins Hives   Shellfish Allergy Hives   Diltiazem Hcl Other (See Comments)    Low heart rate   Sulfonamide Derivatives Nausea And Vomiting   Morphine  And Codeine Other (See Comments)    "Went crazy"   Lovastatin    Metformin  Diarrhea    Review of systems is negative except as noted in the HPI.  Denies nausea/ vomiting/ fevers/ chills or night sweats.   Denies difficulty breathing, denies calf pain or tenderness  Physical Exam  Patient is awake, alert, and oriented x 3.  In no acute distress.    Vascular status is intact with palpable pedal pulses DP and PT bilateral and capillary refill time less than 3 seconds bilateral.  No edema or erythema noted.   Neurological exam reveals epicritic and protective sensation grossly intact bilateral.   Dermatological exam reveals bruising to the posterior aspect of the right heel.  Skin is intact.  No preulcerative lesion is seen.  Skin of the heel is nonblanchable with pressure.     Assessment:   ICD-10-CM   1. Pressure injury of right heel, stage 1  L89.611        Plan: Discussed exam findings with [name].  Recommended she wear a offloading heel protector and 1 was dispensed for her use today.  Also discussed floating the heel whenever she is sitting in her chair at home relaxing.  She may try Arnica  cream to help with the discomfort.  I would like her to be seen back in 1 month for follow-up.  Discussed if she sees any drainage to call to be seen.

## 2023-08-04 NOTE — Patient Instructions (Signed)
 Purchase ARNICA pain relief cream or gel.  Apply to back of heel daily.    Use the blue heel protector when sleeping/ laying to protect your heel    Pressure Injury  A pressure injury, also called a pressure ulcer or bedsore, is an injury to skin and the tissue under the skin that is caused by pressure. It often affects people who must spend a long time in a bed or chair because of a medical condition. Pressure injuries often occur: Over bony parts of the body, such as the tailbone, shoulders, elbows, hips, heels, spine, ankles, and back of the head. Under medical devices that touch the body. These include stockings, equipment to help with breathing, tubes, and splints. Inside the mouth or nose from dentures or tubes. Pressure injuries start as red areas on the skin and can lead to pain and an open wound. What are the causes? This condition is caused by frequent or constant pressure to an area of the body. Less blood flow to the skin can make the tissue die and break down over time, causing a wound. What increases the risk? You are more likely to develop this condition if: You are in the hospital or an extended care facility. You are bedridden or in a wheelchair. You have an injury or disease that keeps you from moving well and feeling pain or pressure. You have a condition that: Makes you sleepy or less alert. Causes poor blood flow. You need to wear a medical device. You have poor control of your bladder or bowel movements (incontinence). You are not getting enough fluid or nutrients (malnutrition). Your health care provider may recommend certain types of mattresses, mattress covers, pillows, cushions, or boots to help prevent a pressure injury. These may include products filled with air, foam, gel, or sand. What are the signs or symptoms? Symptoms of this condition depend on how severe your injury is. Symptoms may include: Red or dark areas of the skin. Pain or a change in skin  texture. Your skin may feel warmer, cooler, softer, or firmer. Blisters. An open wound. How is this diagnosed? This condition is diagnosed based on a medical history and physical exam. You may also have tests, such as: Blood tests. Imaging tests. Blood flow tests. Your injury will be staged based on how severe it is. Staging is based on: How deep the tissue injury is. This includes whether muscle, bone, tendon, or dead tissue is exposed. The cause of the injury. How is this treated? This condition may be treated by: Reducing pressure on your skin. You may need to: Change your position often. Avoid positions that caused the wound or that may make the wound worse. Use certain mattresses, overlays, chair cushions, or protective boots. Move medical devices from an area of pressure, or place padding between the skin and the device. Use foams, creams, or powders to protect your skin from sweat, urine, and stool and reduce rubbing (friction) on the skin. Keeping your skin clean and dry. This may include using a skin cleanser or barrier as told by your health care provider. Cleaning your injury and getting rid of any dead tissue from the wound (debridement). Placing a protective medicine, such as a cream, or bandage (dressing) over your injury. Using medicines for pain or to prevent or treat infection. Surgery may be needed if other treatments are not working or if your injury is very deep. Follow these instructions at home: Medicines Take over-the-counter and prescription medicines only as told  by your health care provider. If you were prescribed antibiotics, take or apply them as told by your health care provider. Do not stop using the antibiotic even if you start to feel better. Eating and drinking Drink enough fluid to keep your urine pale yellow. Eat a healthy diet with lots of protein, as told by your health care provider. Do not use drugs or drink alcohol. Wound care Follow  instructions from your health care provider about how to take care of your wound. Make sure you: Wash your hands with soap and water before and after you change your dressing or apply medicine to your skin. If soap and water are not available, use hand sanitizer. Change your dressing as told by your health care provider. Check your wound every day for signs of infection. Have a caregiver do this for you if you are not able. Check for: Redness, swelling, or more pain. More fluid or blood. Warmth. Pus or a bad smell. Skin care Keep your skin clean and dry. Gently pat your skin dry. Do not rub or massage your skin. Check your skin every day for any changes in color or any new blisters or sores (ulcers). Reducing pressure Do not lie or sit in one position for a long time. Move or change position every 1-2 hours, or as told by your health care provider. Use pillows or cushions to reduce pressure. Ask your health care provider what cushions or pads you should use. General instructions Do not use any products that contain nicotine or tobacco. These products include cigarettes, chewing tobacco, and vaping devices, such as e-cigarettes. If you need help quitting, ask your health care provider. Try to be active every day. Ask your health care provider what exercises or activities are safe for you. Keep all follow-up visits. Your health care provider will check if your injury is healing. Contact a health care provider if: You have a fever or chills. You have pain that does not get better with medicine. Your skin changes color. You have new blisters or sores. You have signs of infection. Your wound does not get better after 1-2 weeks of treatment. This information is not intended to replace advice given to you by your health care provider. Make sure you discuss any questions you have with your health care provider. Document Revised: 09/21/2021 Document Reviewed: 08/27/2021 Elsevier Patient Education   2024 [employer].

## 2023-08-08 ENCOUNTER — Other Ambulatory Visit: Payer: Self-pay

## 2023-08-08 NOTE — Patient Outreach (Signed)
 Complex Care Management   Visit Note  08/08/2023  Name:  [name] MRN: [id number] DOB: 1932/08/20  Situation: Referral received for Complex Care Management related to Heart Failure, Diabetes with Complications, and HTN  I obtained verbal consent from Patient.  Visit completed with patient  on the phone  Background:   Past Medical History:  Diagnosis Date   Allergic rhinitis    Anemia    Anxiety    Barrett esophagus    CAD (coronary artery disease) 2009   a. Multivessel s/p PCI w/DES 2009 // b. s/p CABG 2011  //  c. LHC 8/15: pLAD 95 ISR, LCx 100, pOM1 40, dRCA 100, S-OM1/OM2 ok, S-D1 ok, S-PDA ok, L-LAD ok, EF 60%   Carotid artery disease (HCC)    a. Carotid US  9/15: RICA 1-39%; LICA 40-59% >> FU 1 year  //  b. Carotid US  9/17: R 1-39%, L 40-59% >> FU 1 year   Chronic diastolic heart failure (HCC)    CKD (chronic kidney disease), stage II    GFR 60-89 ml/min   Depression    Disc disease, degenerative, cervical    Diverticulosis    Gastroparesis    GERD (gastroesophageal reflux disease)    Gout    H/O hiatal hernia    Helicobacter pylori gastritis    History of echocardiogram    a. Echo 11/13: EF 55% to 60%. Grade 2 diastolic dysfunction, MAC, trivial MR, mild LAE, normal RVSF, mild RAE, PASP 39 mmHg  //  b. 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   Type II or unspecified type diabetes mellitus without mention of complication, not stated as uncontrolled     Assessment: Patient Reported Symptoms:  Cognitive Cognitive Status: Alert  and oriented to person, place, and time, Normal speech and language skills   Health Maintenance Behaviors: Annual physical exam  Neurological Neurological Review of Symptoms: No symptoms reported    HEENT HEENT Symptoms Reported: Other: (patient reports new sx of drooling for approx 3-4 weeks.) HEENT Management Strategies: Routine screening HEENT Self-Management Outcome: 3 (uncertain)    Cardiovascular Cardiovascular Symptoms Reported: Swelling in legs or feet Cardiovascular Conditions: Heart failure Cardiovascular Management Strategies: Medication therapy, Adequate rest, Routine screening, Weight management, Fluid modification Do You Have a Working Readable Scale?: No (RNCM will request scale for patient) Weight: 183 lb 6.4 oz (83.2 kg) Cardiovascular Self-Management Outcome: 3 (uncertain)  Respiratory Respiratory Symptoms Reported: No symptoms reported (patient reports sx of cough has resolved) Respiratory Conditions: Cough Respiratory Self-Management Outcome: 5 (very good)  Endocrine Patient reports the following symptoms related to hypoglycemia or hyperglycemia : No symptoms reported Is patient diabetic?: Yes Is patient checking blood sugars at home?: Yes Endocrine Conditions: Diabetes Endocrine Management Strategies: Medication therapy, Diet modification, Routine screening Endocrine Self-Management Outcome: 3 (uncertain)  Gastrointestinal Gastrointestinal Symptoms Reported: No symptoms reported   Nutrition Risk Screen (CP): No indicators present  Genitourinary Genitourinary Symptoms Reported: No symptoms reported    Integumentary Integumentary Symptoms  Reported: Bruising Additional Integumentary Details: patient with right heel PU stage 1, p rovided with offloading device and cream, patient reports pain has lessened.  Instructed patient to continue elevation with heel floating while seated Skin Conditions: Pressure injury Skin Management Strategies: Adequate rest, Routine  screening Skin Comment: fu appointment with podiatry 08/30/23  Musculoskeletal Additional Musculoskeletal Details: patient reports symptoms unchanged since last assessment   Falls in the past year?:  (patient reports no new falls since last assessment)    Psychosocial              01/02/2023    1:05 PM  Depression screen PHQ 2/9  Decreased Interest 0  Down, Depressed, Hopeless 0  PHQ - 2 Score 0    There were no vitals filed for this visit.  Medications Reviewed Today   Medications were not reviewed in this encounter     Recommendation:   Patient to purchase BP cuff and home weight scale using OTC benefit with insurance  Follow Up Plan:   Telephone follow up appointment date/time:  08/22/23 at 3:00   Clarnce Crow BSN RN CCM [place]  [facility], [facility] RN Care Manager Direct Dial: [phone] Fax: [phone]

## 2023-08-08 NOTE — Patient Instructions (Signed)
 Visit Information  Thank you for taking time to visit with me today. Please don't hesitate to contact me if I can be of assistance to you before our next scheduled appointment.  Our next appointment is by telephone on 08/22/23 at 3:00 Please call the care guide team at [phone] if you need to cancel or reschedule your appointment.   Following is a copy of your care plan:   Goals Addressed             This Visit's Progress    VBCI RN Care Plan       Problems:  Care Coordination needs related to obtaining needed medical equipment Chronic Disease Management support and education needs related to CHF, DMII, and HTN   Goal: Over the next 30 days the Patient will attend all scheduled medical appointments: take all medication as directed, purchase a blood pressure cuff and weight scale  using OTC benefit card from insurance, monitor and document daily weights, take blood sugar in the morning prior to eating breakfast as evidenced by patient report        continue to work with Medical illustrator and/or Social Worker to address care management and care coordination needs related to CHF, HTN, and need for in home bath aide as evidenced by adherence to care management team scheduled appointments     demonstrate Improved adherence to prescribed treatment plan for CHF, DMII, and HTN as evidenced by taking daily fasting blood sugar, weight and BP readings daily and keeping a log of results  Interventions:   Heart Failure Interventions: Provided education on low sodium diet Assessed need for readable accurate scales in home Advised patient to weigh each morning after emptying bladder Discussed importance of daily weight and advised patient to weigh and record daily Reviewed role of diuretics in prevention of fluid overload and management of heart failure; Discussed the importance of keeping all appointments with provider Assessed social determinant of health barriers   Diabetes  Interventions: Assessed patient's understanding of A1c goal: <7% Provided education to patient about basic DM disease process Reviewed medications with patient and discussed importance of medication adherence Counseled on importance of regular laboratory monitoring as prescribed Discussed plans with patient for ongoing care management follow up and provided patient with direct contact information for care management team Reviewed scheduled/upcoming provider appointments including: 07/31/23 with VBCI BSW, 08/01/23 PM check Advised patient, providing education and rationale, to check cbg fasting and record, calling PCP for findings outside established parameters Review of patient status, including review of consultants reports, relevant laboratory and other test results, and medications completed Lab Results  Component Value Date   HGBA1C 7.0 (H) 04/17/2023    Hypertension Interventions: Last practice recorded BP readings:  BP Readings from Last 3 Encounters:  07/11/23 (!) 156/59  06/29/23 100/82  04/17/23 134/75   Most recent eGFR/CrCl:  Lab Results  Component Value Date   EGFR 32 (L) 05/17/2022    No components found for: "CRCL"  Evaluation of current treatment plan related to hypertension self management and patient's adherence to plan as established by provider Reviewed medications with patient and discussed importance of compliance Provided assistance with obtaining home blood pressure monitor via [employer] member servivces; Discussed plans with patient for ongoing care management follow up and provided patient with direct contact information for care management team Advised patient, providing education and rationale, to monitor blood pressure daily and record, calling PCP for findings outside established parameters Reviewed scheduled/upcoming provider appointments including:  Assessed  social determinant of health barriers  Patient Self-Care Activities:  Attend all  scheduled provider appointments Call pharmacy for medication refills 3-7 days in advance of running out of medications Call provider office for new concerns or questions  Perform all self care activities independently  Take medications as prescribed    Plan:  Telephone follow up appointment with care management team member scheduled for:  08/08/2023 at 10:15             Please call the Suicide and Crisis Lifeline: 988 call the [place]  National Suicide Prevention Lifeline: 972-051-5218 or TTY: (319)272-1575 TTY 909-436-8713) to talk to a trained counselor call 1-800-273-TALK (toll free, 24 hour hotline) if you are experiencing a Mental Health or Behavioral Health Crisis or need someone to talk to.  Patient verbalizes understanding of instructions and care plan provided today and agrees to view in MyChart. Active MyChart status and patient understanding of how to access instructions and care plan via MyChart confirmed with patient.       Clarnce Crow BSN RN CCM [place]  [facility], [facility] RN Care Manager Direct Dial: [phone] Fax: [phone]

## 2023-08-14 ENCOUNTER — Other Ambulatory Visit: Payer: Self-pay | Admitting: Licensed Clinical Social Worker

## 2023-08-14 NOTE — Patient Outreach (Signed)
 Complex Care Management   Visit Note  08/14/2023  Name:  [name] MRN: [id number] DOB: [birth year]  Situation: Referral received for Complex Care Management related to  HHA needed order  I obtained verbal consent from Patient.  Visit completed with patient  on the phone  Background:   Past Medical History:  Diagnosis Date   Allergic rhinitis    Anemia    Anxiety    Barrett esophagus    CAD (coronary artery disease) 2009   a. Multivessel s/p PCI w/DES 2009 // b. s/p CABG 2011  //  c. LHC 8/15: pLAD 95 ISR, LCx 100, pOM1 40, dRCA 100, S-OM1/OM2 ok, S-D1 ok, S-PDA ok, L-LAD ok, EF 60%   Carotid artery disease (HCC)    a. Carotid US  9/15: RICA 1-39%; LICA 40-59% >> FU 1 year  //  b. Carotid US  9/17: R 1-39%, L 40-59% >> FU 1 year   Chronic diastolic heart failure (HCC)    CKD (chronic kidney disease), stage II    GFR 60-89 ml/min   Depression    Disc disease, degenerative, cervical    Diverticulosis    Gastroparesis    GERD (gastroesophageal reflux disease)    Gout    H/O hiatal hernia    Helicobacter pylori gastritis    History of echocardiogram    a. Echo 11/13: EF 55% to 60%. Grade 2 diastolic dysfunction, MAC, trivial MR, mild LAE, normal RVSF, mild RAE, PASP 39 mmHg  //  b. 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   Type II or unspecified type diabetes mellitus without mention of complication, not stated as uncontrolled     Assessment: Patient Reported Symptoms:  Cognitive        Neurological      HEENT        Cardiovascular       Respiratory      Endocrine      Gastrointestinal        Genitourinary      Integumentary      Musculoskeletal          Psychosocial       Quality of Family Relationships: helpful, involved, supportive Do you feel physically threatened by others?: No      01/02/2023    1:05 PM  Depression screen PHQ 2/9  Decreased Interest 0  Down, Depressed, Hopeless 0  PHQ - 2 Score 0    There were no vitals filed for this visit.  Medications Reviewed Today   Medications were not reviewed in this encounter     Recommendation:   Home Health requests: assistance in the home   Follow Up Plan:   Telephone follow up appointment date/time:  08/28/2023 at 10:00 am  Jonda Neighbours, PhD [facility], [facility] Social Worker Direct Dial: [phone]  Fax: [phone]

## 2023-08-14 NOTE — Patient Instructions (Signed)
 Visit Information  Thank you for taking time to visit with me today. Please don't hesitate to contact me if I can be of assistance to you before our next scheduled appointment.  Your next care management appointment is by telephone on 08/28/2023 at 10:00 am  Please call the care guide team at [phone] if you need to cancel, schedule, or reschedule an appointment.   Please call the Suicide and Crisis Lifeline: 988 go to [facility] Urgent [facility] [address], [place] [phone]) call 911 if you are experiencing a Mental Health or Behavioral Health Crisis or need someone to talk to.  Jonda Neighbours, PhD [facility], [facility] Social Worker Direct Dial: [phone]  Fax: [phone]

## 2023-08-22 ENCOUNTER — Other Ambulatory Visit: Payer: Self-pay

## 2023-08-22 NOTE — Patient Outreach (Addendum)
 Complex Care Management   Visit Note  08/22/2023  Name:  [name] MRN: [id number] DOB: [birth year]  Situation: Referral received for Complex Care Management related to Heart Failure, Diabetes with Complications, and HTN I obtained verbal consent from Patient.  Visit completed with patient  on the phone  Patient appeared disoriented, stated her phone was going to shut off, still has not purchased BP cuff or weight scale despite being instructed and educated as to the importance of these items, as well as how to pay for them.  Patient reports that her adult daughter that lives with her, who is deaf, will be going into the hospital for hip surgery.  Patient could not recall when this will be.  I asked to have patient ask her daughter to call me, patient was not able to write my number down, said her daughter would have to call me, (assuming TTY) I offered to call back in 30 minutes.  I did attempt to call back, but was not able to leave voicemail.  Will collaborate with VBCI SW on strategy moving forward.    Background:   Past Medical History:  Diagnosis Date   Allergic rhinitis    Anemia    Anxiety    Barrett esophagus    CAD (coronary artery disease) 2009   a. Multivessel s/p PCI w/DES 2009 // b. s/p CABG 2011  //  c. LHC 8/15: pLAD 95 ISR, LCx 100, pOM1 40, dRCA 100, S-OM1/OM2 ok, S-D1 ok, S-PDA ok, L-LAD ok, EF 60%   Carotid artery disease (HCC)    a. Carotid US  9/15: RICA 1-39%; LICA 40-59% >> FU 1 year  //  b. Carotid US  9/17: R 1-39%, L 40-59% >> FU 1 year   Chronic diastolic heart failure (HCC)    CKD (chronic kidney disease), stage II    GFR 60-89 ml/min   Depression    Disc disease, degenerative, cervical    Diverticulosis    Gastroparesis    GERD (gastroesophageal reflux disease)    Gout    H/O hiatal hernia    Helicobacter pylori gastritis    History of echocardiogram    a. Echo 11/13: EF 55% to 60%. Grade 2 diastolic dysfunction, MAC, trivial MR, mild LAE, normal  RVSF, mild RAE, PASP 39 mmHg  //  b. 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   Type II or unspecified type diabetes mellitus without mention of complication, not stated as uncontrolled     Assessment: Patient Reported Symptoms:  Cognitive Cognitive Status: Confused or disoriented, Able to follow simple commands, Struggling with memory recall      Neurological Neurological Review of Symptoms: Not assessed    HEENT HEENT Symptoms Reported: Not assessed      Cardiovascular Cardiovascular Symptoms Reported: Not assessed    Respiratory Respiratory Symptoms Reported: Not assesed    Endocrine Patient reports the following symptoms related to hypoglycemia or hyperglycemia : Not assessed    Gastrointestinal Gastrointestinal Symptoms  Reported: Not assessed      Genitourinary Genitourinary Symptoms Reported: Not assessed    Integumentary Integumentary Symptoms Reported: Not assessed    Musculoskeletal Musculoskelatal Symptoms Reviewed: Not assessed        Psychosocial Psychosocial Symptoms Reported: Not assessed            01/02/2023    1:05 PM  Depression screen PHQ 2/9  Decreased Interest 0  Down, Depressed, Hopeless 0  PHQ - 2 Score 0    There were no vitals filed for this visit.  Medications Reviewed Today   Medications were not reviewed in this encounter     Recommendation:   Follow up with PCP.  Contacted patient grandson [name], he lives with his mother at the patient home and provides care for her.  He was not aware that  patient had cancelled most recent appointment in April, requested that I assist in rescheduling. Appt was scheduled for 5/20 at 8:40.  RNCM requested that PCP assess for cognitive decline, and home safety.  He also agreed to purchase BP cuff and weight scale on patient behalf.  RNCM provided contact information.   Follow Up Plan:   RNCM will follow up with patient/family after completion of PCP appointment.   Clarnce Crow BSN RN CCM [place]  [facility], [facility] RN Care Manager Direct Dial: [phone] Fax: [phone]

## 2023-08-28 ENCOUNTER — Emergency Department (HOSPITAL_COMMUNITY)

## 2023-08-28 ENCOUNTER — Encounter: Payer: Self-pay | Admitting: Licensed Clinical Social Worker

## 2023-08-28 ENCOUNTER — Other Ambulatory Visit: Payer: Self-pay

## 2023-08-28 ENCOUNTER — Encounter (HOSPITAL_COMMUNITY): Payer: Self-pay | Admitting: Internal Medicine

## 2023-08-28 ENCOUNTER — Observation Stay (HOSPITAL_COMMUNITY)
Admission: EM | Admit: 2023-08-28 | Discharge: 2023-08-29 | Disposition: A | Discharge status: Home / Self Care | Attending: Internal Medicine | Admitting: Internal Medicine

## 2023-08-28 ENCOUNTER — Other Ambulatory Visit: Payer: Self-pay | Admitting: Licensed Clinical Social Worker

## 2023-08-28 DIAGNOSIS — R079 Chest pain, unspecified: Principal | ICD-10-CM | POA: Insufficient documentation

## 2023-08-28 DIAGNOSIS — Z951 Presence of aortocoronary bypass graft: Secondary | ICD-10-CM | POA: Diagnosis not present

## 2023-08-28 DIAGNOSIS — M799 Soft tissue disorder, unspecified: Secondary | ICD-10-CM | POA: Diagnosis not present

## 2023-08-28 DIAGNOSIS — R2689 Other abnormalities of gait and mobility: Secondary | ICD-10-CM | POA: Insufficient documentation

## 2023-08-28 DIAGNOSIS — R072 Precordial pain: Secondary | ICD-10-CM

## 2023-08-28 DIAGNOSIS — R54 Age-related physical debility: Secondary | ICD-10-CM | POA: Diagnosis not present

## 2023-08-28 DIAGNOSIS — Z950 Presence of cardiac pacemaker: Secondary | ICD-10-CM | POA: Insufficient documentation

## 2023-08-28 DIAGNOSIS — E1122 Type 2 diabetes mellitus with diabetic chronic kidney disease: Secondary | ICD-10-CM | POA: Diagnosis not present

## 2023-08-28 DIAGNOSIS — I5032 Chronic diastolic (congestive) heart failure: Secondary | ICD-10-CM | POA: Diagnosis not present

## 2023-08-28 DIAGNOSIS — I2510 Atherosclerotic heart disease of native coronary artery without angina pectoris: Secondary | ICD-10-CM | POA: Insufficient documentation

## 2023-08-28 DIAGNOSIS — I130 Hypertensive heart and chronic kidney disease with heart failure and stage 1 through stage 4 chronic kidney disease, or unspecified chronic kidney disease: Secondary | ICD-10-CM | POA: Diagnosis not present

## 2023-08-28 DIAGNOSIS — Z79899 Other long term (current) drug therapy: Secondary | ICD-10-CM | POA: Diagnosis not present

## 2023-08-28 DIAGNOSIS — R739 Hyperglycemia, unspecified: Secondary | ICD-10-CM | POA: Diagnosis not present

## 2023-08-28 DIAGNOSIS — Z7901 Long term (current) use of anticoagulants: Secondary | ICD-10-CM | POA: Insufficient documentation

## 2023-08-28 DIAGNOSIS — N183 Chronic kidney disease, stage 3 unspecified: Secondary | ICD-10-CM | POA: Insufficient documentation

## 2023-08-28 DIAGNOSIS — I495 Sick sinus syndrome: Secondary | ICD-10-CM | POA: Insufficient documentation

## 2023-08-28 DIAGNOSIS — K449 Diaphragmatic hernia without obstruction or gangrene: Secondary | ICD-10-CM | POA: Diagnosis not present

## 2023-08-28 DIAGNOSIS — R0789 Other chest pain: Secondary | ICD-10-CM | POA: Diagnosis not present

## 2023-08-28 LAB — CBC WITH DIFFERENTIAL/PLATELET
Abs Immature Granulocytes: 0.02 10*3/uL (ref 0.00–0.07)
Basophils Absolute: 0 10*3/uL (ref 0.0–0.1)
Basophils Relative: 0 %
Eosinophils Absolute: 0.3 10*3/uL (ref 0.0–0.5)
Eosinophils Relative: 4 %
HCT: 32.4 % — ABNORMAL LOW (ref 36.0–46.0)
Hemoglobin: 10.5 g/dL — ABNORMAL LOW (ref 12.0–15.0)
Immature Granulocytes: 0 %
Lymphocytes Relative: 21 %
Lymphs Abs: 1.5 10*3/uL (ref 0.7–4.0)
MCH: 31.4 pg (ref 26.0–34.0)
MCHC: 32.4 g/dL (ref 30.0–36.0)
MCV: 97 fL (ref 80.0–100.0)
Monocytes Absolute: 0.7 10*3/uL (ref 0.1–1.0)
Monocytes Relative: 9 %
Neutro Abs: 4.7 10*3/uL (ref 1.7–7.7)
Neutrophils Relative %: 66 %
Platelets: 152 10*3/uL (ref 150–400)
RBC: 3.34 MIL/uL — ABNORMAL LOW (ref 3.87–5.11)
RDW: 12.1 % (ref 11.5–15.5)
WBC: 7.2 10*3/uL (ref 4.0–10.5)
nRBC: 0 % (ref 0.0–0.2)

## 2023-08-28 LAB — D-DIMER, QUANTITATIVE: D-Dimer, Quant: 0.64 ug{FEU}/mL — ABNORMAL HIGH (ref 0.00–0.50)

## 2023-08-28 LAB — GLUCOSE, CAPILLARY: Glucose-Capillary: 125 mg/dL — ABNORMAL HIGH (ref 70–99)

## 2023-08-28 LAB — I-STAT CHEM 8, ED
BUN: 40 mg/dL — ABNORMAL HIGH (ref 8–23)
Calcium, Ion: 0.97 mmol/L — ABNORMAL LOW (ref 1.15–1.40)
Chloride: 108 mmol/L (ref 98–111)
Creatinine, Ser: 1.7 mg/dL — ABNORMAL HIGH (ref 0.44–1.00)
Glucose, Bld: 169 mg/dL — ABNORMAL HIGH (ref 70–99)
HCT: 30 % — ABNORMAL LOW (ref 36.0–46.0)
Hemoglobin: 10.2 g/dL — ABNORMAL LOW (ref 12.0–15.0)
Potassium: 3.9 mmol/L (ref 3.5–5.1)
Sodium: 140 mmol/L (ref 135–145)
TCO2: 23 mmol/L (ref 22–32)

## 2023-08-28 LAB — BASIC METABOLIC PANEL WITH GFR
Anion gap: 11 (ref 5–15)
BUN: 34 mg/dL — ABNORMAL HIGH (ref 8–23)
CO2: 20 mmol/L — ABNORMAL LOW (ref 22–32)
Calcium: 7.9 mg/dL — ABNORMAL LOW (ref 8.9–10.3)
Chloride: 110 mmol/L (ref 98–111)
Creatinine, Ser: 1.51 mg/dL — ABNORMAL HIGH (ref 0.44–1.00)
GFR, Estimated: 33 mL/min — ABNORMAL LOW (ref >60)
Glucose, Bld: 166 mg/dL — ABNORMAL HIGH (ref 70–99)
Potassium: 3.9 mmol/L (ref 3.5–5.1)
Sodium: 141 mmol/L (ref 135–145)

## 2023-08-28 LAB — TROPONIN I (HIGH SENSITIVITY)
Troponin I (High Sensitivity): 22 ng/L — ABNORMAL HIGH (ref <18)
Troponin I (High Sensitivity): 24 ng/L — ABNORMAL HIGH (ref <18)
Troponin I (High Sensitivity): 26 ng/L — ABNORMAL HIGH (ref <18)

## 2023-08-28 LAB — CBG MONITORING, ED: Glucose-Capillary: 187 mg/dL — ABNORMAL HIGH (ref 70–99)

## 2023-08-28 MED ORDER — SODIUM CHLORIDE 0.9% FLUSH
3.0000 mL | Freq: Two times a day (BID) | INTRAVENOUS | Status: DC
  Administered 2023-08-28 – 2023-08-29 (×3): 3 mL via INTRAVENOUS

## 2023-08-28 MED ORDER — SACUBITRIL-VALSARTAN 97-103 MG PO TABS
1.0000 | ORAL_TABLET | Freq: Two times a day (BID) | ORAL | Status: DC
Start: 2023-08-28 — End: 2023-08-28

## 2023-08-28 MED ORDER — FUROSEMIDE 40 MG PO TABS
40.0000 mg | ORAL_TABLET | Freq: Every day | ORAL | Status: DC
  Administered 2023-08-28 – 2023-08-29 (×2): 40 mg via ORAL
  Filled 2023-08-28 (×2): qty 1

## 2023-08-28 MED ORDER — ATORVASTATIN CALCIUM 10 MG PO TABS
20.0000 mg | ORAL_TABLET | Freq: Every day | ORAL | Status: DC
  Administered 2023-08-28 – 2023-08-29 (×2): 20 mg via ORAL
  Filled 2023-08-28 (×2): qty 2

## 2023-08-28 MED ORDER — ACETAMINOPHEN 650 MG RE SUPP: 650.0000 mg | Freq: Four times a day (QID) | RECTAL | Status: DC | PRN

## 2023-08-28 MED ORDER — ASPIRIN 81 MG PO CHEW: 324.0000 mg | CHEWABLE_TABLET | Freq: Once | ORAL | Status: DC

## 2023-08-28 MED ORDER — ENOXAPARIN SODIUM 30 MG/0.3ML IJ SOSY
30.0000 mg | PREFILLED_SYRINGE | INTRAMUSCULAR | Status: DC
Start: 2023-08-29 — End: 2023-08-29
  Administered 2023-08-29: 30 mg via SUBCUTANEOUS
  Filled 2023-08-28: qty 0.3

## 2023-08-28 MED ORDER — ACETAMINOPHEN 325 MG PO TABS: 650.0000 mg | ORAL_TABLET | Freq: Four times a day (QID) | ORAL | Status: DC | PRN

## 2023-08-28 MED ORDER — CARVEDILOL 12.5 MG PO TABS
12.5000 mg | ORAL_TABLET | Freq: Two times a day (BID) | ORAL | Status: DC
Start: 2023-08-28 — End: 2023-08-29
  Administered 2023-08-28 – 2023-08-29 (×2): 12.5 mg via ORAL
  Filled 2023-08-28 (×2): qty 1

## 2023-08-28 MED ORDER — CLOPIDOGREL BISULFATE 75 MG PO TABS
75.0000 mg | ORAL_TABLET | Freq: Every day | ORAL | Status: DC
  Administered 2023-08-28 – 2023-08-29 (×2): 75 mg via ORAL
  Filled 2023-08-28 (×2): qty 1

## 2023-08-28 MED ORDER — POLYETHYLENE GLYCOL 3350 17 G PO PACK: 17.0000 g | PACK | Freq: Every day | ORAL | Status: DC | PRN

## 2023-08-28 NOTE — Plan of Care (Signed)

## 2023-08-28 NOTE — Assessment & Plan Note (Addendum)
 X 24 hours. CXR wnl. Low concern for VTE given radiation to left arm, no pleuritic nature of pain and no hypoxemia. Will check D-dimer age adjusted. Will trend troponin till flat/down. Cardio input appreciated, they are pursuing echo.

## 2023-08-28 NOTE — Patient Outreach (Signed)
 SW attempted to call PT. No response. SW found out PT is in ED with chest pains. SW contacted Clarnce Crow, RN VBCI to update her about PT being in hospital.

## 2023-08-28 NOTE — ED Triage Notes (Signed)
 BIB [facility] from home for chest pain located in center left radiating to left arm. Patient also complains of headache. Patient took one nitroglycerin  at home with relief. Ems gave 324 mg of aspirin  and one nitroglycerin  sublingual without relief. Patient states 8/10 pain.   Patient has a pacemaker.

## 2023-08-28 NOTE — ED Provider Notes (Addendum)
 [place] EMERGENCY DEPARTMENT AT [facility] Provider Note   CSN: [id number] Arrival date & time: 08/28/23  1019     History  Chief Complaint  Patient presents with   Chest Pain    [name] is a 89 y.o. female.  Who is presenting today to the ED by EMS for chest pain she describes as in the left center of her chest radiating up through the left arm.  She states that this began yesterday though eased and today has worsened prompting her to call EMS.  She has no associated shortness of breath, no associated dizziness or vertigo, and states that while she does not have anything that exacerbates her pain nothing seems to make her pain better as well.  She denies having a cough, and has not been recently ill.  Prior to arrival to the emergency department she was given aspirin  and nitroglycerin  without any relief of her pain.  She has a previous history of hypertension, hyperlipidemia, heart failure with preserved ejection fraction, stage III chronic kidney disease, and previous coronary artery disease requiring previous CABG.  Per records she is currently using albuterol  for coughing, for blood pressure she is currently using amlodipine , carvedilol , furosemide .  Further noted to be on atorvastatin  for hyperlipidemia.   Chest Pain Associated symptoms: no cough and no shortness of breath        Home Medications Prior to Admission medications   Medication Sig Start Date End Date Taking? Authorizing Provider  ACCU-CHEK GUIDE test strip CHECK GLUCOSE IN THE MORNING AT  [facility] AND AT BEDTIME 11/09/22   Crecencio Dodge, Yvonne R, DO  Accu-Chek Softclix Lancets lancets CHECK GLUCOSE IN THE MORNING AT  [facility] AND AT BEDTIME 11/09/22   Crecencio Dodge, Yvonne R, DO  acetaminophen  (TYLENOL ) 500 MG tablet Take 500 mg by mouth every 6 (six) hours as needed for headache (pain).    [provider]  albuterol  (VENTOLIN  HFA) 108 (90 Base) MCG/ACT inhaler Inhale 2 puffs into the lungs every  6 (six) hours as needed for wheezing or shortness of breath. 07/11/23   Trenton Frock, PA-C  allopurinol  (ZYLOPRIM ) 100 MG tablet Take 1 tablet (100 mg total) by mouth at bedtime. 04/17/23   Crecencio Dodge, Adel Holt R, DO  amLODipine  (NORVASC ) 5 MG tablet TAKE 1 TABLET BY MOUTH TWICE  DAILY 03/11/23   Crecencio Dodge, Yvonne R, DO  atorvastatin  (LIPITOR) 20 MG tablet TAKE 1 TABLET BY MOUTH ONCE  DAILY 06/05/23   Cooper, Michael, MD  azelastine (OPTIVAR) 0.05 % ophthalmic solution Place 1 drop into both eyes 2 (two) times daily. 10/26/15   [provider]  Blood Glucose Monitoring Suppl DEVI 1 each by Does not apply route in the morning, at noon, and at bedtime. May substitute to any manufacturer covered by patient's insurance. 10/14/22   Lowne Chase, Yvonne R, DO  carvedilol  (COREG ) 12.5 MG tablet TAKE 1 TABLET BY MOUTH TWICE  DAILY 11/15/22   Arnoldo Lapping, MD  clopidogrel  (PLAVIX ) 75 MG tablet TAKE 1 TABLET BY MOUTH DAILY 05/08/23   Cooper, Michael, MD  clotrimazole -betamethasone  (LOTRISONE ) cream Apply 1 Application topically daily. 03/31/23   Lowne Chase, Yvonne R, DO  clotrimazole -betamethasone  (LOTRISONE ) cream Apply 1 Application topically daily. 06/29/23   Lowne Chase, Yvonne R, DO  dicyclomine  (BENTYL ) 10 MG capsule Take 1 capsule (10 mg total) by mouth 4 (four) times daily -  before meals and at bedtime. 01/02/23   Trenton Frock, PA-C  dicyclomine  (BENTYL ) 10 MG capsule  Take 1 capsule (10 mg total) by mouth 4 (four) times daily -  before meals and at bedtime. 02/10/23   Estill Hemming, DO  dicyclomine  (BENTYL ) 10 MG capsule Take 1 capsule (10 mg total) by mouth 3 (three) times daily before meals. 03/31/23   Estill Hemming, DO  ENTRESTO  97-103 MG TAKE 1 TABLET BY MOUTH TWICE  DAILY 06/05/23   Cooper, Michael, MD  furosemide  (LASIX ) 40 MG tablet Take 1 tablet by mouth once daily 06/28/23   Arnoldo Lapping, MD  Insulin  Pen Needle (BD PEN NEEDLE MICRO U/F) 32G X 6 MM MISC To inject Humalin  06/01/22   Lowne Chase, Yvonne R, DO  methocarbamol  (ROBAXIN ) 500 MG tablet Take 1 tablet (500 mg total) by mouth every 6 (six) hours as needed for muscle spasms. 06/29/23   Lowne Chase, Yvonne R, DO  Multiple Vitamin (MULTIVITAMIN WITH MINERALS) TABS tablet Take 1 tablet by mouth at bedtime.     [provider]  nitroGLYCERIN  (NITROSTAT ) 0.4 MG SL tablet DISSOLVE ONE TABLET UNDER THE TONGUE EVERY 5 MINUTES AS NEEDED FOR CHEST PAIN.  DO NOT EXCEED A TOTAL OF 3 DOSES IN 15 MINUTES 02/27/20   Bertis Brochure K, NP  NONFORMULARY OR COMPOUNDED ITEM Compression socks 20-30 mm/hg dx low ext edema 03/31/23   Crecencio Dodge, Yvonne R, DO  nystatin  cream (MYCOSTATIN ) Apply 1 Application topically 2 (two) times daily. 03/03/23   Jolanda Nation, NP  nystatin  powder Apply 1 Application topically 3 (three) times daily. 06/29/23   Roel Clarity R, DO  ONE TOUCH ULTRA TEST test strip 1 each by Other route daily as needed (blood sugar).  07/10/13   [provider]  pantoprazole  (PROTONIX ) 40 MG tablet Take 1 tablet (40 mg total) by mouth daily. 07/28/23   Estill Hemming, DO  potassium chloride  (KLOR-CON ) 10 MEQ tablet Take 1 tablet (10 mEq total) by mouth daily. Patient taking differently: Take 10 mEq by mouth every Monday, Wednesday, and Friday. 07/12/22   Marlyse Single T, PA-C  predniSONE  (DELTASONE ) 20 MG tablet Take 1 tablet (20 mg total) by mouth daily with breakfast. 07/11/23   Drubel, Heidi Llamas, PA-C  tobramycin (TOBREX) 0.3 % ophthalmic solution Place 1 drop into the left eye 4 (four) times daily. 06/13/20   [provider]      Allergies    Ciprofloxacin , Codeine, Hydrocodone, Penicillins, Shellfish allergy, Diltiazem hcl, Sulfonamide derivatives, Morphine  and codeine, Lovastatin, and Metformin     Review of Systems   Review of Systems  Respiratory:  Negative for cough and shortness of breath.   Cardiovascular:  Positive for chest pain.  All other systems reviewed and are  negative.   Physical Exam Updated Vital Signs BP (!) 146/73   Pulse 60   Temp 97.8 F (36.6 C) (Oral)   Resp (!) 22   Ht 5\' 6"  (1.676 m)   Wt 83.9 kg   SpO2 100%   BMI 29.86 kg/m  Physical Exam Vitals and nursing note reviewed.  Constitutional:      General: She is not in acute distress.    Appearance: Normal appearance.  HENT:     Head: Normocephalic and atraumatic.     Mouth/Throat:     Mouth: Mucous membranes are moist.     Pharynx: Oropharynx is clear.  Eyes:     Extraocular Movements: Extraocular movements intact.     Conjunctiva/sclera: Conjunctivae normal.     Pupils: Pupils are equal, round, and reactive to  light.  Neck:     Thyroid : No thyromegaly.     Vascular: No hepatojugular reflux or JVD.     Trachea: No tracheal deviation.  Cardiovascular:     Rate and Rhythm: Normal rate and regular rhythm. No extrasystoles are present.    Chest Wall: PMI is not displaced.     Pulses: Normal pulses.          Radial pulses are 2+ on the right side and 2+ on the left side.     Heart sounds: Normal heart sounds. No murmur heard.    No friction rub. No gallop.  Pulmonary:     Effort: Pulmonary effort is normal. No accessory muscle usage or respiratory distress.     Breath sounds: Examination of the right-middle field reveals wheezing. Examination of the right-lower field reveals wheezing. Examination of the left-lower field reveals wheezing. Wheezing present. No decreased breath sounds, rhonchi or rales.  Chest:     Chest wall: No tenderness or crepitus. There is no dullness to percussion.  Abdominal:     General: Abdomen is flat. Bowel sounds are normal.     Palpations: Abdomen is soft.  Musculoskeletal:        General: Normal range of motion.     Cervical back: Normal range of motion and neck supple.     Right lower leg: No edema.     Left lower leg: No edema.  Lymphadenopathy:     Cervical: No cervical adenopathy.  Skin:    General: Skin is warm and dry.      Capillary Refill: Capillary refill takes less than 2 seconds.  Neurological:     General: No focal deficit present.     Mental Status: She is alert and oriented to person, place, and time. Mental status is at baseline.  Psychiatric:        Mood and Affect: Mood normal.     ED Results / Procedures / Treatments   Labs (all labs ordered are listed, but only abnormal results are displayed) Labs Reviewed  BASIC METABOLIC PANEL WITH GFR - Abnormal; Notable for the following components:      Result Value   CO2 20 (*)    Glucose, Bld 166 (*)    BUN 34 (*)    Creatinine, Ser 1.51 (*)    Calcium  7.9 (*)    GFR, Estimated 33 (*)    All other components within normal limits  CBC WITH DIFFERENTIAL/PLATELET - Abnormal; Notable for the following components:   RBC 3.34 (*)    Hemoglobin 10.5 (*)    HCT 32.4 (*)    All other components within normal limits  CBG MONITORING, ED - Abnormal; Notable for the following components:   Glucose-Capillary 187 (*)    All other components within normal limits  I-STAT CHEM 8, ED - Abnormal; Notable for the following components:   BUN 40 (*)    Creatinine, Ser 1.70 (*)    Glucose, Bld 169 (*)    Calcium , Ion 0.97 (*)    Hemoglobin 10.2 (*)    HCT 30.0 (*)    All other components within normal limits  TROPONIN I (HIGH SENSITIVITY) - Abnormal; Notable for the following components:   Troponin I (High Sensitivity) 22 (*)    All other components within normal limits  TROPONIN I (HIGH SENSITIVITY) - Abnormal; Notable for the following components:   Troponin I (High Sensitivity) 24 (*)    All other components within normal limits  CBG MONITORING,  ED    EKG None  Radiology DG Chest Portable 1 View Result Date: 08/28/2023 CLINICAL DATA:  Chest pain. EXAM: PORTABLE CHEST 1 VIEW COMPARISON:  07/11/2023 FINDINGS: Normal-sized heart. Tortuous and partially calcified thoracic aorta. Stable post CABG changes. Stable left subclavian bipolar pacemaker leads.  Clear lungs with normal vascularity. Soft tissue density overlying the right upper chest related to a skin fold. No definite airspace opacity. A previously demonstrated hiatal hernia is not well visualized. Mild thoracic spine degenerative changes. IMPRESSION: No acute abnormality. Electronically Signed   By: Catherin Closs M.D.   On: 08/28/2023 12:23    Procedures Procedures    Medications Ordered in ED Medications - No data to display   ED Course/ Medical Decision Making/ A&P             HEART Score: 7                    Medical Decision Making Amount and/or Complexity of Data Reviewed Labs: ordered. Radiology: ordered.  Risk OTC drugs. Decision regarding hospitalization.   Medical Decision Making:   KYNA BLAHNIK is a 89 y.o. female who presented to the ED today with chest pain detailed above.    Additional history discussed with patient's family/caregivers.  Complete initial physical exam performed, notably the patient  was alert and oriented no apparent distress however complaining of substernal chest pain rating to the left arm.  There is no pain with palpation of the chest wall, and she is breathing normally with good air entry to all fields however coarse bronchovesicular sounds are noted.    Reviewed and confirmed nursing documentation for past medical history, family history, social history.    Initial Assessment:   With the patient's presentation of chest discomfort, most likely diagnosis is possible ACS. Other diagnoses were considered including (but not limited to) upper respiratory infection, pulmonary embolus, pneumothorax. These are considered less likely due to history of present illness and physical exam findings.     Initial Plan:  Obtain initial and repeat troponins to assess cardiac etiology Screening labs including CBC and Metabolic panel to evaluate for infectious or metabolic etiology of disease.  Patient given aspirin  and nitroglycerin  prior to  arrival CXR to evaluate for structural/infectious intrathoracic pathology.  EKG and serial troponin to evaluate for cardiac pathology. Objective evaluation as below reviewed   Initial Study Results:   Laboratory  All laboratory results reviewed without evidence of clinically relevant pathology.   Exceptions include: Elevated creatinine of 1.51 which is consistent with previous readings.  Hemoglobin 10.5 which again is consistent with previous findings.  Troponin initially is 22, second troponin is 24.  EKG EKG was reviewed independently. Rate, rhythm, axis, intervals all examined and without medically relevant abnormality. ST segments without concerns for elevations.    Radiology:  All images reviewed independently. Agree with radiology report at this time.   DG Chest Portable 1 View Result Date: 08/28/2023 CLINICAL DATA:  Chest pain. EXAM: PORTABLE CHEST 1 VIEW COMPARISON:  07/11/2023 FINDINGS: Normal-sized heart. Tortuous and partially calcified thoracic aorta. Stable post CABG changes. Stable left subclavian bipolar pacemaker leads. Clear lungs with normal vascularity. Soft tissue density overlying the right upper chest related to a skin fold. No definite airspace opacity. A previously demonstrated hiatal hernia is not well visualized. Mild thoracic spine degenerative changes. IMPRESSION: No acute abnormality. Electronically Signed   By: Catherin Closs M.D.   On: 08/28/2023 12:23      Consults: Case discussed  with cardiology who states that the patient should be admitted to medicine and they will round on the patient tomorrow.   Reassessment and Plan:   Based on heart score of 7 as well as increased troponin, along with history and physical exam, find patient will benefit from inpatient observation for monitoring of her current cardiac condition.  Discussed patient with cardiology who states that patient should be admitted to medicine and they will round on the patient in the morning.   Discussed patient with the inpatient hospitalist team, Dr. Bennie Brave, who accepts the patient for admission.  Final Clinical Impression(s) / ED Diagnoses Final diagnoses:  Chest pain, unspecified type    Rx / DC Orders ED Discharge Orders     None         Juanetta Nordmann, PA 08/28/23 1535    Juanetta Nordmann, [place] 08/28/23 1540    Juanetta Nordmann, [place] 08/28/23 1541    Quinn Bucco, DO 09/11/23 2107

## 2023-08-28 NOTE — Assessment & Plan Note (Signed)
 C/w plavix  and lipitor

## 2023-08-28 NOTE — Consult Note (Addendum)
 Cardiology Consultation   Patient ID: [name] MRN: [id number]; DOB: 12/30/1932  Admit date: 08/28/2023 Date of Consult: 08/28/2023  PCP:  Estill Hemming, DO   [facility] Providers Cardiologist:  Arnoldo Lapping, MD  Cardiology APP:  Gabino Joe, PA-C       Patient Profile:   [name] is a 89 y.o. female with a hx of CAD s/p CABG 2011 (last cath 12/02/2013 patent grafts), HFpEF, SSS s/p Medtronic PPM 2013, HTN, HLD, DM II, CKD stage III, PAD, Barrett's esophagus, carotid artery disease, and hypothyroidism who is being seen 08/28/2023 for the evaluation of chest pain at the request of Dr. Cleda Curly.  History of Present Illness:   Ms. [name] is a frail 89 year old female with PMH of CAD s/p CABG 2011 (last cath 12/02/2013 patent grafts), HFpEF, SSS s/p Medtronic PPM 2013, HTN, HLD, DM II, CKD stage III, PAD, Barrett's esophagus, carotid artery disease, and hypothyroidism.  Cardiac catheterization performed on 12/02/2013 showed patent SVG-OM1/OM 2, SVG-D1, SVG-PDA, LIMA-LAD.  Myoview  on 11/24/2015 showed EF 60%, no ischemia, infarct, overall low risk.  She has a history of HFpEF but intolerant of SGLT2 inhibitor due to yeast infection and intolerant of spironolactone  due to side effect of diarrhea.  Most recent echocardiogram obtained on 01/31/2022 showed EF 55 to 60%, grade 1 DD, RVSP 26.2 mmHg, trivial MR, trivial AI.  The last time she was seen by Dr. Nunzio Belch was in 2022.  Her remote device interrogation has since been followed by Dr. Lawana Pray.  She was last seen by Dr. Arlester Ladd in July 2024 at which time she was doing well.  Device interrogation in January 2025 showed normal device function, battery life 37-month.  She lives with one of her daughter.  She is very frail, has to walk with a walker.  Her functional ability is quite limited.  She says walking to the mailbox will be too far for her.  She was in her usual state of health until 08/27/2023 when she started having  left-sided chest discomfort radiating down the left arm.  Her left chest is tender on palpation at the pacemaker site, however this is different from her chest pain yesterday.  She is not sure if this symptom is worse with deep inspiration, body rotation and palpation.  She has not noticed any symptoms during walking lately, however as mentioned above she does not do much exertion.  She took nitroglycerin  which helped with her symptom.  This symptom recurred this morning and this time did not go away with nitroglycerin .  This prompted the patient to seek urgent medical attention at [facility], ED.  Serial troponin was 22--> 24.  Creatinine 1.51 on arrival.  Hemoglobin 10.5.  Although patient says her left lower extremity is chronically swollen compared to the right side, however on physical exam, left lower extremity was not swollen, it was the right lower extremity that had a mild edema.  Her lung is clear on exam.  Cardiology service consulted for chest discomfort.  Per granddaughter, her device was interrogated in the emergency room, however I was unable to locate the device the device interrogation.   Past Medical History:  Diagnosis Date   Allergic rhinitis    Anemia    Anxiety    Barrett esophagus    CAD (coronary artery disease) 2009   a. Multivessel s/p PCI w/DES 2009 // b. s/p CABG 2011  //  c. LHC 8/15: pLAD 95 ISR, LCx 100, pOM1 40,  dRCA 100, S-OM1/OM2 ok, S-D1 ok, S-PDA ok, L-LAD ok, EF 60%   Carotid artery disease (HCC)    a. Carotid US  9/15: RICA 1-39%; LICA 40-59% >> FU 1 year  //  b. Carotid US  9/17: R 1-39%, L 40-59% >> FU 1 year   Chronic diastolic heart failure (HCC)    CKD (chronic kidney disease), stage II    GFR 60-89 ml/min   Depression    Disc disease, degenerative, cervical    Diverticulosis    Gastroparesis    GERD (gastroesophageal reflux disease)    Gout    H/O hiatal hernia    Helicobacter pylori gastritis    History of echocardiogram    a. Echo 11/13: EF 55% to  60%. Grade 2 diastolic dysfunction, MAC, trivial MR, mild LAE, normal RVSF, mild RAE, PASP 39 mmHg  //  b. 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   Type II or unspecified type diabetes mellitus without mention of complication, not stated as uncontrolled     Past Surgical History:  Procedure Laterality Date   ABDOMINAL HYSTERECTOMY     CARDIAC CATHETERIZATION     2011  DR Kingjames Coury (APPT NEXT WEEK)   CHOLECYSTECTOMY     CORONARY ARTERY BYPASS GRAFT  2011   LIMA-LAD, SVG-DIAG, SVG-OM1-OM2, SVG-PDA   CORONARY STENT PLACEMENT     Drug-eluting stent to the left anterior descending, circumflex and right coronary artery in Jan 2009   EYE SURGERY     BIL CATARACT REMOVAL 06/2010   LEFT HEART CATHETERIZATION WITH CORONARY ANGIOGRAM N/A 12/02/2013   Procedure: LEFT HEART CATHETERIZATION WITH CORONARY ANGIOGRAM;  Surgeon: Mickiel Albany, MD;  Location: [facility] CATH LAB;  Service: Cardiovascular;  Laterality: N/A;   OVARIAN CYST REMOVAL     PACEMAKER INSERTION  03/06/12   MDT Adapta L implanted by Dr Nunzio Belch for SSS   PERMANENT PACEMAKER INSERTION N/A 03/06/2012   Procedure: PERMANENT PACEMAKER INSERTION;  Surgeon: Jolly Needle, MD;  Location: [facility] CATH LAB;  Service: Cardiovascular;  Laterality: N/A;   SHOULDER ARTHROSCOPY  06/16/2011   Procedure: ARTHROSCOPY SHOULDER;  Surgeon: Jonna Netter, MD;  Location: [facility] OR;  Service: Orthopedics;  Laterality: Left;  LEFT SHOULDER ARTHROSCOPY ACROMIALPLASTY, POSSIBLE MINI OPEN CUFF REPAIR    TUBAL LIGATION       Home  Medications:  Prior to Admission medications   Medication Sig Start Date End Date Taking? Authorizing Provider  ACCU-CHEK GUIDE test strip CHECK GLUCOSE IN THE MORNING AT  [facility] AND AT BEDTIME 11/09/22   Crecencio Dodge, Yvonne R, DO  Accu-Chek Softclix Lancets lancets CHECK GLUCOSE IN THE MORNING AT  [facility] AND AT BEDTIME 11/09/22   Crecencio Dodge, Yvonne R, DO  acetaminophen  (TYLENOL ) 500 MG tablet Take 500 mg by mouth every 6 (six) hours as needed for headache (pain).    [provider]  albuterol  (VENTOLIN  HFA) 108 (90 Base) MCG/ACT inhaler Inhale 2 puffs into the lungs every 6 (six) hours as  needed for wheezing or shortness of breath. 07/11/23   Trenton Frock, PA-C  allopurinol  (ZYLOPRIM ) 100 MG tablet Take 1 tablet (100 mg total) by mouth at bedtime. 04/17/23   Roel Clarity R, DO  amLODipine  (NORVASC ) 5 MG tablet TAKE 1 TABLET BY MOUTH TWICE  DAILY 03/11/23   Crecencio Dodge, Yvonne R, DO  atorvastatin  (LIPITOR) 20 MG tablet TAKE 1 TABLET BY MOUTH ONCE  DAILY 06/05/23   Valera Vallas, MD  azelastine (OPTIVAR) 0.05 % ophthalmic solution Place 1 drop into both eyes 2 (two) times daily. 10/26/15   [provider]  Blood Glucose Monitoring Suppl DEVI 1 each by Does not apply route in the morning, at noon, and at bedtime. May substitute to any manufacturer covered by patient's insurance. 10/14/22   Lowne Chase, Yvonne R, DO  carvedilol  (COREG ) 12.5 MG tablet TAKE 1 TABLET BY MOUTH TWICE  DAILY 11/15/22   Arnoldo Lapping, MD  clopidogrel  (PLAVIX ) 75 MG tablet TAKE 1 TABLET BY MOUTH DAILY 05/08/23   Arnoldo Lapping, MD  clotrimazole -betamethasone  (LOTRISONE ) cream Apply 1 Application topically daily. 03/31/23   Lowne Chase, Yvonne R, DO  clotrimazole -betamethasone  (LOTRISONE ) cream Apply 1 Application topically daily. 06/29/23   Lowne Chase, Yvonne R, DO  dicyclomine  (BENTYL ) 10 MG capsule Take 1 capsule (10 mg total) by mouth 4 (four) times daily -  before meals and at bedtime. 01/02/23   Trenton Frock, PA-C  dicyclomine  (BENTYL ) 10 MG capsule Take 1 capsule (10 mg total) by mouth 4 (four) times daily -  before meals and at bedtime. 02/10/23   Estill Hemming, DO  dicyclomine  (BENTYL ) 10 MG capsule Take 1 capsule (10 mg total) by mouth 3 (three) times daily before meals. 03/31/23   Estill Hemming, DO  ENTRESTO  97-103 MG TAKE 1 TABLET BY MOUTH TWICE  DAILY 06/05/23   Jayke Caul, MD  furosemide  (LASIX ) 40 MG tablet Take 1 tablet by mouth once daily 06/28/23   Arnoldo Lapping, MD  Insulin  Pen Needle (BD PEN NEEDLE MICRO U/F) 32G X 6 MM MISC To inject Humalin 06/01/22   Lowne Chase, Yvonne R, DO  methocarbamol  (ROBAXIN ) 500 MG tablet Take 1 tablet (500 mg total) by mouth every 6 (six) hours as needed for muscle spasms. 06/29/23   Lowne Chase, Yvonne R, DO  Multiple Vitamin (MULTIVITAMIN WITH MINERALS) TABS tablet Take 1 tablet by mouth at bedtime.     [provider]  nitroGLYCERIN  (NITROSTAT ) 0.4 MG SL tablet DISSOLVE ONE TABLET UNDER THE TONGUE EVERY 5 MINUTES AS NEEDED FOR CHEST PAIN.  DO NOT EXCEED A TOTAL OF 3 DOSES IN 15 MINUTES 02/27/20   Bertis Brochure K, NP  NONFORMULARY OR COMPOUNDED ITEM Compression socks 20-30 mm/hg dx low ext edema 03/31/23   Crecencio Dodge, Yvonne R, DO  nystatin  cream (MYCOSTATIN ) Apply 1 Application topically 2 (two) times daily. 03/03/23   Jolanda Nation, NP  nystatin  powder Apply 1 Application topically 3 (three) times daily. 06/29/23   Roel Clarity R, DO  ONE TOUCH ULTRA TEST test strip 1 each by Other route daily as needed (blood sugar).  07/10/13   [provider]  pantoprazole  (PROTONIX ) 40 MG tablet Take 1 tablet (40 mg total) by mouth daily. 07/28/23   Estill Hemming, DO  potassium chloride  (KLOR-CON ) 10 MEQ tablet Take 1 tablet (10 mEq total) by mouth daily. Patient taking differently: Take 10 mEq by mouth every Monday, Wednesday, and Friday. 07/12/22   Gabino Joe,  PA-C  predniSONE  (DELTASONE ) 20 MG tablet Take 1  tablet (20 mg total) by mouth daily with breakfast. 07/11/23   Drubel, Heidi Llamas, PA-C  tobramycin (TOBREX) 0.3 % ophthalmic solution Place 1 drop into the left eye 4 (four) times daily. 06/13/20   [provider]    Inpatient Medications: Scheduled Meds:  betamethasone  acetate-betamethasone  sodium phosphate  3 mg Intramuscular Once   [START ON 08/29/2023] enoxaparin  (LOVENOX ) injection  30 mg Subcutaneous Q24H   sodium chloride  flush  3 mL Intravenous Q12H   Continuous Infusions:  PRN Meds: acetaminophen  **OR** acetaminophen , polyethylene glycol  Allergies:    Allergies  Allergen Reactions   Ciprofloxacin  Nausea And Vomiting and Other (See Comments)    Syncope, also    Codeine Other (See Comments)    HALLUCINATIONS   Hydrocodone Other (See Comments)    Made her pass out   Penicillins Hives   Shellfish Allergy Hives   Diltiazem Hcl Other (See Comments)    Low heart rate   Sulfonamide Derivatives Nausea And Vomiting   Morphine  And Codeine Other (See Comments)    "Went crazy"   Lovastatin    Metformin  Diarrhea    Social History:   Social History   Socioeconomic History   Marital status: Widowed    Spouse name: Not on file   Number of children: Not on file   Years of education: Not on file   Highest education level: Not on file  Occupational History   Occupation: RETIRED LPN    Comment: worked as [employer]  Tobacco Use   Smoking status: Former    Current packs/day: 0.00    Average packs/day: 0.3 packs/day for 28.0 years (7.0 ttl pk-yrs)    Types: Cigarettes    Start date: 63    Quit date: 23    Years since quitting: 31.4   Smokeless tobacco: Never   Tobacco comments:    quit 30 yrs ago  Vaping Use   Vaping status: Never Used  Substance and Sexual Activity   Alcohol use: No   Drug use: No   Sexual activity: Not on file  Other Topics Concern   Not on file  Social History Narrative   Widowed 2004.., Lives with daughter and grand son ,Family history is  negative for premature coronary artery disease. Mother died at age 72 with heart disease in her later years, father died at age 30 from a stroke.Aaron AasShe  has 8 siblings, none of whom have coronary artery disease.   Social Drivers of Corporate investment banker Strain: Low Risk  (11/02/2022)   Overall Financial Resource Strain (CARDIA)    Difficulty of Paying Living Expenses: Not hard at all  Food Insecurity: No Food Insecurity (07/31/2023)   Hunger Vital Sign    Worried About Running Out of Food in the Last Year: Never true    Ran Out of Food in the Last Year: Never true  Transportation Needs: No Transportation Needs (07/31/2023)   PRAPARE - Administrator, Civil Service (Medical): No    Lack of Transportation (Non-Medical): No  Physical Activity: Insufficiently Active (11/02/2022)   Exercise Vital Sign    Days of Exercise per Week: 5 days    Minutes of Exercise per Session: 20 min  Stress: No Stress Concern Present (11/02/2022)   [employer] of Occupational Health - Occupational Stress Questionnaire    Feeling of Stress : Only a little  Social Connections: Moderately Isolated (11/02/2022)   Social Connection and Isolation Panel [  NHANES]    Frequency of Communication with Friends and Family: More than three times a week    Frequency of Social Gatherings with Friends and Family: Once a week    Attends Religious Services: More than 4 times per year    Active Member of [employer] or Organizations: No    Attends Banker Meetings: Never    Marital Status: Widowed  Intimate Partner Violence: Not At Risk (07/31/2023)   Humiliation, Afraid, Rape, and Kick questionnaire    Fear of Current or Ex-Partner: No    Emotionally Abused: No    Physically Abused: No    Sexually Abused: No    Family History:    Family History  Problem Relation Age of Onset   Diabetes Mother    Hypertension Mother    Heart attack Mother    Stroke Father    Cancer Brother    Deafness Daughter     Other Son        cerebral edema at 18 months   Coronary artery disease Other      ROS:  Please see the history of present illness.   All other ROS reviewed and negative.     Physical Exam/Data:   Vitals:   08/28/23 1300 08/28/23 1330 08/28/23 1400 08/28/23 1500  BP: (!) 165/51 (!) 147/44 (!) 146/73 (!) 138/45  Pulse: 60 60 60 60  Resp: (!) 21 (!) 25 (!) 22 (!) 21  Temp:    97.6 F (36.4 C)  TempSrc:    Oral  SpO2: 100% 100% 100% 100%  Weight:      Height:       No intake or output data in the 24 hours ending 08/28/23 1601    08/28/2023   10:32 AM 08/08/2023   10:42 AM 07/28/2023   11:44 AM  Last 3 Weights  Weight (lbs) 185 lb 183 lb 6.4 oz 183 lb 9.6 oz  Weight (kg) 83.915 kg 83.19 kg 83.28 kg     Body mass index is 29.86 kg/m.  General:  Well nourished, well developed, in no acute distress HEENT: normal Neck: no JVD Vascular: No carotid bruits; Distal pulses 2+ bilaterally Cardiac:  normal S1, S2; RRR; no murmur  Lungs:  clear to auscultation bilaterally, no wheezing, rhonchi or rales  Abd: soft, nontender, no hepatomegaly  Ext: 1+ RLE edema Musculoskeletal:  No deformities, BUE and BLE strength normal and equal Skin: warm and dry  Neuro:  CNs 2-12 intact, no focal abnormalities noted Psych:  Normal affect   EKG:  The EKG was personally reviewed and demonstrates: Paced rhythm, left bundle branch block, PACs. Telemetry:  Telemetry was personally reviewed and demonstrates: Paced rhythm, no significant ventricular ectopy  Relevant CV Studies:  Echo 01/31/2022  1. Left ventricular ejection fraction, by estimation, is 55 to 60%. The  left ventricle has normal function. The left ventricle has no regional  wall motion abnormalities. Left ventricular diastolic parameters are  consistent with Grade I diastolic  dysfunction (impaired relaxation).   2. Right ventricular systolic function is mildly reduced. The right  ventricular size is mildly enlarged. There is  normal pulmonary artery  systolic pressure. The estimated right ventricular systolic pressure is  26.2 mmHg.   3. The mitral valve is normal in structure. Trivial mitral valve  regurgitation.   4. The aortic valve is tricuspid. Aortic valve regurgitation is trivial.  Aortic valve sclerosis/calcification is present, without any evidence of  aortic stenosis.   5. The inferior vena  cava is normal in size with greater than 50%  respiratory variability, suggesting right atrial pressure of 3 mmHg.    Laboratory Data:  High Sensitivity Troponin:   Recent Labs  Lab 08/28/23 1040 08/28/23 1240  TROPONINIHS 22* 24*     Chemistry Recent Labs  Lab 08/28/23 1040 08/28/23 1101  NA 141 140  K 3.9 3.9  CL 110 108  CO2 20*  --   GLUCOSE 166* 169*  BUN 34* 40*  CREATININE 1.51* 1.70*  CALCIUM  7.9*  --   GFRNONAA 33*  --   ANIONGAP 11  --     No results for input(s): "PROT", "ALBUMIN", "AST", "ALT", "ALKPHOS", "BILITOT" in the last 168 hours. Lipids No results for input(s): "CHOL", "TRIG", "HDL", "LABVLDL", "LDLCALC", "CHOLHDL" in the last 168 hours.  Hematology Recent Labs  Lab 08/28/23 1040 08/28/23 1101  WBC 7.2  --   RBC 3.34*  --   HGB 10.5* 10.2*  HCT 32.4* 30.0*  MCV 97.0  --   MCH 31.4  --   MCHC 32.4  --   RDW 12.1  --   PLT 152  --    Thyroid  No results for input(s): "TSH", "FREET4" in the last 168 hours.  BNPNo results for input(s): "BNP", "PROBNP" in the last 168 hours.  DDimer No results for input(s): "DDIMER" in the last 168 hours.   Radiology/Studies:  DG Chest Portable 1 View Result Date: 08/28/2023 CLINICAL DATA:  Chest pain. EXAM: PORTABLE CHEST 1 VIEW COMPARISON:  07/11/2023 FINDINGS: Normal-sized heart. Tortuous and partially calcified thoracic aorta. Stable post CABG changes. Stable left subclavian bipolar pacemaker leads. Clear lungs with normal vascularity. Soft tissue density overlying the right upper chest related to a skin fold. No definite airspace  opacity. A previously demonstrated hiatal hernia is not well visualized. Mild thoracic spine degenerative changes. IMPRESSION: No acute abnormality. Electronically Signed   By: Catherin Closs M.D.   On: 08/28/2023 12:23     Assessment and Plan:   Chest discomfort  - Radiating down the left arm, intermittent since yesterday.  Serial troponin 22--> 24.  - Likely will have medicine to admit given comorbidities.  Given her advanced age and frailty, will obtain repeat echocardiogram.  Given her frailty and her advanced age, if echocardiogram is normal and the patient is able to ambulate with assistance tomorrow, may not need any additional ischemic workup.  Otherwise Myoview  can be considered.  CAD s/p CABG 2011: Last cardiac catheterization 2017.  Continue home Plavix  and Lipitor  SSS s/p Medtronic PPM 2013: Per granddaughter, her device was interrogated in the ED today, however I am unable to locate the report.  Will message our Medtronic device rep.  HFpEF: 1+ pitting edema in the right lower extremity, no significant edema in the left lower extremity.  Overall largely euvolemic.  Per patient, she has chronic lower extremity edema.  Her lungs clear.  Hypertension: Continue home amlodipine  and carvedilol   Hyperlipidemia: On Lipitor 20 mg daily at home.  CKD stage III: Creatinine 1.5-1.7.   Risk Assessment/Risk Scores:      For questions or updates, please contact [facility] Please consult [email or web address] for contact info under    Mikael Albright, [place]  08/28/2023 4:01 PM  Patient seen, examined. Available data reviewed. Agree with findings, assessment, and plan as outlined by Ervin Heath, PA.  The patient is independently interviewed and examined today.  She has moved from the emergency department up to the inpatient unit and she is  by herself.  She is sitting up in bed eating dinner.  She is well-known to me from the outpatient setting.  The patient is elderly, alert, and oriented.   HEENT is notable for poor dentition, otherwise clear.  Neck with normal JVP.  Carotid upstrokes are normal.  Lungs are clear bilaterally.  Heart is regular rate and rhythm with no murmur or gallop.  There is no chest wall tenderness.  Abdomen is soft and nontender.  There is 1+ edema in the dorsum of the right foot and trace edema on the left.  EKG shows AV sequential pacing.  Chest x-ray is clear with no acute abnormalities.Creatinine is elevated at 1.7.  This is modestly increased from her baseline which is around 1.5.  High-sensitivity troponin is 22 and 24.  The patient has chest pain with typical and atypical features.  She has had some left-sided pain radiating down the left arm all the way into the left hand.  Symptoms are not precipitated by any specific movement or associated with physical exertion.  Her high-sensitivity troponins are reassuring.  She is quite elderly with reduced functional capacity.  I feel reassured by her essentially normal high-sensitivity troponin.  I think it is appropriate to check an echocardiogram to evaluate for any new LV dysfunction or regional wall motion abnormalities.  I do not think the patient needs inpatient ischemic evaluation.  She is a poor candidate for cardiac catheterization.  Noninvasive imaging could be considered and I think it would be appropriate to perform a pharmacologic Myoview  scan as an outpatient.  As long as she is able to ambulate tomorrow without significant chest discomfort, and her echo shows no major abnormalities, I think she will be medically stable for hospital discharge and she can undergo a noninvasive ischemic assessment as an outpatient.  Otherwise, as outlined above.  Arnoldo Lapping, M.D. 08/28/2023 5:26 PM

## 2023-08-28 NOTE — H&P (Signed)
 History and Physical    Patient: [name] NFA:[id number] DOB: 23-Apr-1932 DOA: 08/28/2023 DOS: the patient was seen and examined on 08/28/2023 PCP: Estill Hemming, DO  Patient coming from: Home  Chief Complaint:  Chief Complaint  Patient presents with   Chest Pain   [name] is a 89 y.o. female with medical history significant of prior coronary artery disease as well as PCI.  Patient seems to have been in her usual state of health till yesterday when sometime around midday she started having dull achy pain of the left chest radiating to the left arm pretty much constant although relieved somewhat by nitroglycerin  used.  It became progressive and was rather severe today.  Patient otherwise does not identify any aggravating relieving factor no trauma and no rash on her skin.  No associated shortness of breath palpitation presyncope cough or fever.  No radiation of pain to certain movements of the arm no trauma no leg swelling reported.  Patient came to the ER due to above complaints as pain became worse.  Patient received aspirin  in the ER.  Reports being pain-free since then.  Has been feeling a little cold in the hospital.  Review of Systems: As mentioned in the history of present illness. All other systems reviewed and are negative. Past Medical History:  Diagnosis Date   Allergic rhinitis    Anemia    Anxiety    Barrett esophagus    CAD (coronary artery disease) 2009   a. Multivessel s/p PCI w/DES 2009 // b. s/p CABG 2011  //  c. LHC 8/15: pLAD 95 ISR, LCx 100, pOM1 40, dRCA 100, S-OM1/OM2 ok, S-D1 ok, S-PDA ok, L-LAD ok, EF 60%   Carotid artery disease (HCC)    a. Carotid US  9/15: RICA 1-39%; LICA 40-59% >> FU 1 year  //  b. Carotid US  9/17: R 1-39%, L 40-59% >> FU 1 year   Chronic diastolic heart failure (HCC)    CKD (chronic kidney disease), stage II    GFR 60-89 ml/min   Depression    Disc disease, degenerative, cervical    Diverticulosis     Gastroparesis    GERD (gastroesophageal reflux disease)    Gout    H/O hiatal hernia    Helicobacter pylori gastritis    History of echocardiogram    a. Echo 11/13: EF 55% to 60%. Grade 2 diastolic dysfunction, MAC, trivial MR, mild LAE, normal RVSF, mild RAE, PASP 39 mmHg  //  b. 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   Type II or unspecified type diabetes mellitus without mention of complication, not stated as uncontrolled    Past Surgical History:  Procedure Laterality Date   ABDOMINAL HYSTERECTOMY     CARDIAC CATHETERIZATION     2011  DR COOPER (APPT NEXT WEEK)   CHOLECYSTECTOMY     CORONARY ARTERY BYPASS GRAFT  2011   LIMA-LAD, SVG-DIAG, SVG-OM1-OM2, SVG-PDA   CORONARY STENT PLACEMENT  Drug-eluting stent to the left anterior descending, circumflex and right coronary artery in Jan 2009   EYE SURGERY     BIL CATARACT REMOVAL 06/2010   LEFT HEART CATHETERIZATION WITH CORONARY ANGIOGRAM N/A 12/02/2013   Procedure: LEFT HEART CATHETERIZATION WITH CORONARY ANGIOGRAM;  Surgeon: Mickiel Albany, MD;  Location: [facility] CATH LAB;  Service: Cardiovascular;  Laterality: N/A;   OVARIAN CYST REMOVAL     PACEMAKER INSERTION  03/06/12   MDT Adapta L implanted by Dr Nunzio Belch for SSS   PERMANENT PACEMAKER INSERTION N/A 03/06/2012   Procedure: PERMANENT PACEMAKER INSERTION;  Surgeon: Jolly Needle, MD;  Location: [facility] CATH LAB;  Service: Cardiovascular;  Laterality: N/A;   SHOULDER ARTHROSCOPY  06/16/2011   Procedure: ARTHROSCOPY SHOULDER;  Surgeon: Jonna Netter,  MD;  Location: [facility] OR;  Service: Orthopedics;  Laterality: Left;  LEFT SHOULDER ARTHROSCOPY ACROMIALPLASTY, POSSIBLE MINI OPEN CUFF REPAIR    TUBAL LIGATION     Social History:  reports that she quit smoking about 31 years ago. Her smoking use included cigarettes. She started smoking about 59 years ago. She has a 7 pack-year smoking history. She has never used smokeless tobacco. She reports that she does not drink alcohol and does not use drugs.  Allergies  Allergen Reactions   Ciprofloxacin  Nausea And Vomiting and Other (See Comments)    Syncope, also    Codeine Other (See Comments)    HALLUCINATIONS   Hydrocodone Other (See Comments)    Made her pass out   Penicillins Hives   Shellfish Allergy Hives   Diltiazem Hcl Other (See Comments)    Low heart rate   Sulfonamide Derivatives Nausea And Vomiting   Morphine  And Codeine Other (See Comments)    "Went crazy"   Lovastatin    Metformin  Diarrhea    Family History  Problem Relation Age of Onset   Diabetes Mother    Hypertension Mother    Heart attack Mother    Stroke Father    Cancer Brother    Deafness Daughter    Other Son        cerebral edema at 18 months   Coronary artery disease Other     Prior to Admission medications   Medication Sig Start Date End Date Taking? Authorizing Provider  ACCU-CHEK GUIDE test strip CHECK GLUCOSE IN THE MORNING AT  [facility] AND AT BEDTIME 11/09/22   Crecencio Dodge, Yvonne R, DO  Accu-Chek Softclix Lancets lancets CHECK GLUCOSE IN THE MORNING AT  [facility] AND AT BEDTIME 11/09/22   Crecencio Dodge, Yvonne R, DO  acetaminophen  (TYLENOL ) 500 MG tablet Take 500 mg by mouth every 6 (six) hours as needed for headache (pain).    [provider]  albuterol  (VENTOLIN  HFA) 108 (90 Base) MCG/ACT inhaler Inhale 2 puffs into the lungs every 6 (six) hours as needed for wheezing or shortness of breath. 07/11/23   Trenton Frock, PA-C  allopurinol  (ZYLOPRIM ) 100 MG tablet Take 1 tablet (100 mg total) by mouth at  bedtime. 04/17/23   Roel Clarity R, DO  amLODipine  (NORVASC ) 5 MG tablet TAKE 1 TABLET BY MOUTH TWICE  DAILY 03/11/23   Crecencio Dodge, Yvonne R, DO  atorvastatin  (LIPITOR) 20 MG tablet TAKE 1 TABLET BY MOUTH ONCE  DAILY 06/05/23   Cooper, Michael, MD  azelastine (OPTIVAR) 0.05 % ophthalmic solution Place 1 drop into both eyes 2 (two) times daily. 10/26/15   [provider]  Blood Glucose Monitoring Suppl DEVI 1 each by Does not apply  route in the morning, at noon, and at bedtime. May substitute to any manufacturer covered by patient's insurance. 10/14/22   Lowne Chase, Yvonne R, DO  carvedilol  (COREG ) 12.5 MG tablet TAKE 1 TABLET BY MOUTH TWICE  DAILY 11/15/22   Arnoldo Lapping, MD  clopidogrel  (PLAVIX ) 75 MG tablet TAKE 1 TABLET BY MOUTH DAILY 05/08/23   Arnoldo Lapping, MD  clotrimazole -betamethasone  (LOTRISONE ) cream Apply 1 Application topically daily. 03/31/23   Lowne Chase, Yvonne R, DO  clotrimazole -betamethasone  (LOTRISONE ) cream Apply 1 Application topically daily. 06/29/23   Estill Hemming, DO  dicyclomine  (BENTYL ) 10 MG capsule Take 1 capsule (10 mg total) by mouth 4 (four) times daily -  before meals and at bedtime. 01/02/23   Trenton Frock, PA-C  dicyclomine  (BENTYL ) 10 MG capsule Take 1 capsule (10 mg total) by mouth 4 (four) times daily -  before meals and at bedtime. 02/10/23   Estill Hemming, DO  dicyclomine  (BENTYL ) 10 MG capsule Take 1 capsule (10 mg total) by mouth 3 (three) times daily before meals. 03/31/23   Estill Hemming, DO  ENTRESTO  97-103 MG TAKE 1 TABLET BY MOUTH TWICE  DAILY 06/05/23   Cooper, Michael, MD  furosemide  (LASIX ) 40 MG tablet Take 1 tablet by mouth once daily 06/28/23   Arnoldo Lapping, MD  Insulin  Pen Needle (BD PEN NEEDLE MICRO U/F) 32G X 6 MM MISC To inject Humalin 06/01/22   Lowne Chase, Yvonne R, DO  methocarbamol  (ROBAXIN ) 500 MG tablet Take 1 tablet (500 mg total) by mouth every 6 (six) hours as needed for muscle spasms. 06/29/23    Lowne Chase, Yvonne R, DO  Multiple Vitamin (MULTIVITAMIN WITH MINERALS) TABS tablet Take 1 tablet by mouth at bedtime.     [provider]  nitroGLYCERIN  (NITROSTAT ) 0.4 MG SL tablet DISSOLVE ONE TABLET UNDER THE TONGUE EVERY 5 MINUTES AS NEEDED FOR CHEST PAIN.  DO NOT EXCEED A TOTAL OF 3 DOSES IN 15 MINUTES 02/27/20   Bertis Brochure K, NP  NONFORMULARY OR COMPOUNDED ITEM Compression socks 20-30 mm/hg dx low ext edema 03/31/23   Crecencio Dodge, Yvonne R, DO  nystatin  cream (MYCOSTATIN ) Apply 1 Application topically 2 (two) times daily. 03/03/23   Jolanda Nation, NP  nystatin  powder Apply 1 Application topically 3 (three) times daily. 06/29/23   Roel Clarity R, DO  ONE TOUCH ULTRA TEST test strip 1 each by Other route daily as needed (blood sugar).  07/10/13   [provider]  pantoprazole  (PROTONIX ) 40 MG tablet Take 1 tablet (40 mg total) by mouth daily. 07/28/23   Estill Hemming, DO  potassium chloride  (KLOR-CON ) 10 MEQ tablet Take 1 tablet (10 mEq total) by mouth daily. Patient taking differently: Take 10 mEq by mouth every Monday, Wednesday, and Friday. 07/12/22   Marlyse Single T, PA-C  predniSONE  (DELTASONE ) 20 MG tablet Take 1 tablet (20 mg total) by mouth daily with breakfast. 07/11/23   Drubel, Heidi Llamas, PA-C  tobramycin (TOBREX) 0.3 % ophthalmic solution Place 1 drop into the left eye 4 (four) times daily. 06/13/20   [provider]    Physical Exam: Vitals:   08/28/23 1400 08/28/23 1500 08/28/23 1656 08/28/23 1659  BP: (!) 146/73 (!) 138/45 (!) 142/66   Pulse: 60 60 60 (!) 58  Resp: (!) 22 (!) 21 20 (!) 22  Temp:  97.6 F (36.4 C) 98.6 F (37 C) 98.1 F (36.7 C)  TempSrc:  Oral Oral Oral  SpO2: 100% 100%  100% 100%  Weight:      Height:       General: Patient is alert and awake, gives a coherent account of her symptoms does not appear to be distressed. Respiratory exam: Bilateral intravesically Cardiovascular exam S1-S2 normal Abdomen all quadrant  soft nontender Extremities warm without edema patient moves all 4 extremities to direction.  Data Reviewed:  Labs on Admission:  Results for orders placed or performed during the hospital encounter of 08/28/23 (from the past 24 hours)  CBG monitoring, ED     Status: Abnormal   Collection Time: 08/28/23 10:30 AM  Result Value Ref Range   Glucose-Capillary 187 (H) 70 - 99 mg/dL  Basic metabolic panel     Status: Abnormal   Collection Time: 08/28/23 10:40 AM  Result Value Ref Range   Sodium 141 135 - 145 mmol/L   Potassium 3.9 3.5 - 5.1 mmol/L   Chloride 110 98 - 111 mmol/L   CO2 20 (L) 22 - 32 mmol/L   Glucose, Bld 166 (H) 70 - 99 mg/dL   BUN 34 (H) 8 - 23 mg/dL   Creatinine, Ser 7.82 (H) 0.44 - 1.00 mg/dL   Calcium  7.9 (L) 8.9 - 10.3 mg/dL   GFR, Estimated 33 (L) >60 mL/min   Anion gap 11 5 - 15  Troponin I (High Sensitivity)     Status: Abnormal   Collection Time: 08/28/23 10:40 AM  Result Value Ref Range   Troponin I (High Sensitivity) 22 (H) <18 ng/L  CBC with Differential/Platelet     Status: Abnormal   Collection Time: 08/28/23 10:40 AM  Result Value Ref Range   WBC 7.2 4.0 - 10.5 K/uL   RBC 3.34 (L) 3.87 - 5.11 MIL/uL   Hemoglobin 10.5 (L) 12.0 - 15.0 g/dL   HCT 95.6 (L) 21.3 - 08.6 %   MCV 97.0 80.0 - 100.0 fL   MCH 31.4 26.0 - 34.0 pg   MCHC 32.4 30.0 - 36.0 g/dL   RDW 57.8 46.9 - 62.9 %   Platelets 152 150 - 400 K/uL   nRBC 0.0 0.0 - 0.2 %   Neutrophils Relative % 66 %   Neutro Abs 4.7 1.7 - 7.7 K/uL   Lymphocytes Relative 21 %   Lymphs Abs 1.5 0.7 - 4.0 K/uL   Monocytes Relative 9 %   Monocytes Absolute 0.7 0.1 - 1.0 K/uL   Eosinophils Relative 4 %   Eosinophils Absolute 0.3 0.0 - 0.5 K/uL   Basophils Relative 0 %   Basophils Absolute 0.0 0.0 - 0.1 K/uL   Immature Granulocytes 0 %   Abs Immature Granulocytes 0.02 0.00 - 0.07 K/uL  I-stat chem 8, ED     Status: Abnormal   Collection Time: 08/28/23 11:01 AM  Result Value Ref Range   Sodium 140 135 - 145  mmol/L   Potassium 3.9 3.5 - 5.1 mmol/L   Chloride 108 98 - 111 mmol/L   BUN 40 (H) 8 - 23 mg/dL   Creatinine, Ser 5.28 (H) 0.44 - 1.00 mg/dL   Glucose, Bld 413 (H) 70 - 99 mg/dL   Calcium , Ion 0.97 (L) 1.15 - 1.40 mmol/L   TCO2 23 22 - 32 mmol/L   Hemoglobin 10.2 (L) 12.0 - 15.0 g/dL   HCT 24.4 (L) 01.0 - 27.2 %  Troponin I (High Sensitivity)     Status: Abnormal   Collection Time: 08/28/23 12:40 PM  Result Value Ref Range   Troponin I (High Sensitivity) 24 (H) <18  ng/L  Glucose, capillary     Status: Abnormal   Collection Time: 08/28/23  4:59 PM  Result Value Ref Range   Glucose-Capillary 125 (H) 70 - 99 mg/dL   Basic Metabolic Panel: Recent Labs  Lab 08/28/23 1040 08/28/23 1101  NA 141 140  K 3.9 3.9  CL 110 108  CO2 20*  --   GLUCOSE 166* 169*  BUN 34* 40*  CREATININE 1.51* 1.70*  CALCIUM  7.9*  --    Liver Function Tests: No results for input(s): "AST", "ALT", "ALKPHOS", "BILITOT", "PROT", "ALBUMIN" in the last 168 hours. No results for input(s): "LIPASE", "AMYLASE" in the last 168 hours. No results for input(s): "AMMONIA" in the last 168 hours. CBC: Recent Labs  Lab 08/28/23 1040 08/28/23 1101  WBC 7.2  --   NEUTROABS 4.7  --   HGB 10.5* 10.2*  HCT 32.4* 30.0*  MCV 97.0  --   PLT 152  --    Cardiac Enzymes: Recent Labs  Lab 08/28/23 1040 08/28/23 1240  TROPONINIHS 22* 24*    BNP (last 3 results) Recent Labs    03/27/23 1140 03/31/23 1140  PROBNP 336.0* 255.0*   CBG: Recent Labs  Lab 08/28/23 1030 08/28/23 1659  GLUCAP 187* 125*    Radiological Exams on Admission:  DG Chest Portable 1 View Result Date: 08/28/2023 CLINICAL DATA:  Chest pain. EXAM: PORTABLE CHEST 1 VIEW COMPARISON:  07/11/2023 FINDINGS: Normal-sized heart. Tortuous and partially calcified thoracic aorta. Stable post CABG changes. Stable left subclavian bipolar pacemaker leads. Clear lungs with normal vascularity. Soft tissue density overlying the right upper chest related  to a skin fold. No definite airspace opacity. A previously demonstrated hiatal hernia is not well visualized. Mild thoracic spine degenerative changes. IMPRESSION: No acute abnormality. Electronically Signed   By: Catherin Closs M.D.   On: 08/28/2023 12:23      EKG: Independently reviewed. Seems to have Paced rhythm with IVCD. Not interpretable for signs of ischemia.  No intake/output data recorded. No intake/output data recorded.       Assessment and Plan: * Chest pain X 24 hours. CXR wnl. Low concern for VTE given radiation to left arm, no pleuritic nature of pain and no hypoxemia. Will check D-dimer age adjusted. Will trend troponin till flat/down. Cardio input appreciated, they are pursuing echo.  (HFpEF) heart failure with preserved ejection fraction (HCC) This is chronic. C.w. lasix , patient seems to be on entrest and coreg   Hx of CABG C/w plavix  and lipitor   Ppx - lovenox .  Please review medication reconciliation after pharmacy input    Advance Care Planning:   Code Status: Limited: Do not attempt resuscitation (DNR) -DNR-LIMITED -Do Not Intubate/DNI  per pateint.  Consults: cardiology consult noted thanks.  Family Communication: per apteitn.  Severity of Illness: The appropriate patient status for this patient is OBSERVATION. Observation status is judged to be reasonable and necessary in order to provide the required intensity of service to ensure the patient's safety. The patient's presenting symptoms, physical exam findings, and initial radiographic and laboratory data in the context of their medical condition is felt to place them at decreased risk for further clinical deterioration. Furthermore, it is anticipated that the patient will be medically stable for discharge from the hospital within 2 midnights of admission.   Author: Bennie Brave, MD 08/28/2023 6:04 PM  For on call review [email or web address].

## 2023-08-28 NOTE — Assessment & Plan Note (Signed)
 This is chronic. C.w. lasix , patient seems to be on entrest and coreg 

## 2023-08-28 NOTE — ED Notes (Signed)
 Nt called CCMD@10 :51am

## 2023-08-29 ENCOUNTER — Other Ambulatory Visit: Payer: Self-pay | Admitting: Cardiovascular Disease

## 2023-08-29 ENCOUNTER — Ambulatory Visit: Admitting: Family Medicine

## 2023-08-29 ENCOUNTER — Observation Stay (HOSPITAL_BASED_OUTPATIENT_CLINIC_OR_DEPARTMENT_OTHER)

## 2023-08-29 DIAGNOSIS — R079 Chest pain, unspecified: Principal | ICD-10-CM

## 2023-08-29 DIAGNOSIS — R072 Precordial pain: Secondary | ICD-10-CM | POA: Diagnosis not present

## 2023-08-29 DIAGNOSIS — I5032 Chronic diastolic (congestive) heart failure: Secondary | ICD-10-CM

## 2023-08-29 LAB — BASIC METABOLIC PANEL WITH GFR
Anion gap: 10 (ref 5–15)
BUN: 29 mg/dL — ABNORMAL HIGH (ref 8–23)
CO2: 24 mmol/L (ref 22–32)
Calcium: 8.3 mg/dL — ABNORMAL LOW (ref 8.9–10.3)
Chloride: 106 mmol/L (ref 98–111)
Creatinine, Ser: 1.45 mg/dL — ABNORMAL HIGH (ref 0.44–1.00)
GFR, Estimated: 35 mL/min — ABNORMAL LOW (ref >60)
Glucose, Bld: 154 mg/dL — ABNORMAL HIGH (ref 70–99)
Potassium: 3.8 mmol/L (ref 3.5–5.1)
Sodium: 140 mmol/L (ref 135–145)

## 2023-08-29 LAB — CBC
HCT: 34.2 % — ABNORMAL LOW (ref 36.0–46.0)
Hemoglobin: 11.3 g/dL — ABNORMAL LOW (ref 12.0–15.0)
MCH: 31.4 pg (ref 26.0–34.0)
MCHC: 33 g/dL (ref 30.0–36.0)
MCV: 95 fL (ref 80.0–100.0)
Platelets: 163 10*3/uL (ref 150–400)
RBC: 3.6 MIL/uL — ABNORMAL LOW (ref 3.87–5.11)
RDW: 11.9 % (ref 11.5–15.5)
WBC: 7.8 10*3/uL (ref 4.0–10.5)
nRBC: 0 % (ref 0.0–0.2)

## 2023-08-29 LAB — GLUCOSE, CAPILLARY: Glucose-Capillary: 168 mg/dL — ABNORMAL HIGH (ref 70–99)

## 2023-08-29 LAB — ECHOCARDIOGRAM COMPLETE
Area-P 1/2: 3.34 cm2
Height: 66 in
S' Lateral: 2.6 cm
Weight: 2960 [oz_av]

## 2023-08-29 LAB — PROTIME-INR
INR: 1.1 (ref 0.8–1.2)
Prothrombin Time: 13.9 s (ref 11.4–15.2)

## 2023-08-29 LAB — APTT: aPTT: 27 s (ref 24–36)

## 2023-08-29 MED ORDER — AMLODIPINE BESYLATE 5 MG PO TABS
5.0000 mg | ORAL_TABLET | Freq: Every day | ORAL | Status: DC
  Administered 2023-08-29: 5 mg via ORAL
  Filled 2023-08-29: qty 1

## 2023-08-29 MED ORDER — PERFLUTREN LIPID MICROSPHERE
1.0000 mL | INTRAVENOUS | Status: AC | PRN
  Administered 2023-08-29: 3 mL via INTRAVENOUS

## 2023-08-29 NOTE — Discharge Summary (Signed)
 Physician Discharge Summary   Patient: [name] MRN: [id number] DOB: Feb 25, 1933  Admit date:     08/28/2023  Discharge date: 08/29/23  Discharge Physician: MDALA-GAUSI, Jilda Most   PCP: Estill Hemming, DO   Recommendations at discharge:   Follow-up with cardiology.  Discharge Diagnoses: Principal Problem:   Chest pain  Resolved Problems:   * No resolved hospital problems. *  Hospital Course: 89 y o woman with PMH of CAD s/p  CABG, PAD, HFpEF, T2DM, hypertension, CKD stage III, PCI, SSS s/p PPM, Barrett's esophagus who presented to the ED with chest pain.  The hospital course is in problem-based format below:    Chest pain Chest x-ray was within normal limits. There was low concern for VTE given radiation to left arm, no pleuritic pain, no hypoxemia. D-dimer was only mildly elevated at 0.64. Troponin was trended: 22 > 24 > 26 Cardiology was consulted. Echocardiogram was done and showed EF 55 to 60%, no regional wall motion abnormalities, mild LVH. Patient has diastolic dysfunction.  The day after presentation, the patient was feeling better.  She had no recurrence of the chest pain, even with ambulation.  Cardiology recommended outpatient follow-up.  Patient was discharged home in stable condition.  CAD s/p CABG Home Plavix  and Lipitor were continued.  SSS s/p PPM The device was reportedly interrogated in the ED and no issues were noted.  HTN Home amlodipine  and Coreg  were continued.  HFpEF Home Coreg , Lasix , Entresto  were resumed at discharge.  T2DM, controlled A1c: Was 7.0 on 04/17/2023. Diet controlled.  Hyperlipidemia Home atorvastatin  was continued.  CKD stage III Baseline creatinine is 1.3 to 1.7 Creatinine remained at baseline, and was 1.45 on the day of discharge.  Frailty Seen by Occupational Therapy. Home health PT/OT recommended.        Consultants: Cardiology Procedures performed: n/a  Disposition: Home Diet  recommendation:  Discharge Diet Orders (From admission, onward)     Start     Ordered   08/29/23 0000  Diet - low sodium heart healthy        08/29/23 1211           Cardiac and Carb modified diet DISCHARGE MEDICATION: Allergies as of 08/29/2023       Reactions   Codeine Other (See Comments)   Hallucinations   Hydrocodone Other (See Comments)   Made her pass out   Penicillins Hives   Shellfish Allergy Hives   Sulfonamide Derivatives Nausea And Vomiting   Cipro  [ciprofloxacin  Hcl] Nausea And Vomiting, Other (See Comments)   Syncope    Morphine  And Codeine Other (See Comments)   Hallucinations "Went crazy"   Tiazac [diltiazem] Other (See Comments)   Bradycardia    Glucophage  [metformin ] Diarrhea   Mevacor [lovastatin] Other (See Comments)   Unknown reaction        Medication List     TAKE these medications    Accu-Chek Guide test strip Generic drug: glucose blood CHECK GLUCOSE IN THE MORNING AT  NOON AND AT BEDTIME   Accu-Chek Softclix Lancets lancets CHECK GLUCOSE IN THE MORNING AT  NOON AND AT BEDTIME   acetaminophen  500 MG tablet Commonly known as: TYLENOL  Take 500 mg by mouth every 6 (six) hours as needed for headache (pain).   albuterol  108 (90 Base) MCG/ACT inhaler Commonly known as: VENTOLIN  HFA Inhale 2 puffs into the lungs every 6 (six) hours as needed for wheezing or shortness of breath.   allopurinol  100 MG tablet Commonly known as:  ZYLOPRIM  Take 1 tablet (100 mg total) by mouth at bedtime.   amLODipine  5 MG tablet Commonly known as: NORVASC  TAKE 1 TABLET BY MOUTH TWICE  DAILY   atorvastatin  20 MG tablet Commonly known as: LIPITOR TAKE 1 TABLET BY MOUTH ONCE  DAILY What changed: when to take this   azelastine 0.05 % ophthalmic solution Commonly known as: OPTIVAR Place 1 drop into both eyes 2 (two) times daily.   carvedilol  12.5 MG tablet Commonly known as: COREG  TAKE 1 TABLET BY MOUTH TWICE  DAILY   clopidogrel  75 MG  tablet Commonly known as: PLAVIX  TAKE 1 TABLET BY MOUTH DAILY   clotrimazole -betamethasone  cream Commonly known as: LOTRISONE  Apply 1 Application topically daily. What changed:  when to take this reasons to take this   Entresto  97-103 MG Generic drug: sacubitril -valsartan  TAKE 1 TABLET BY MOUTH TWICE  DAILY   furosemide  40 MG tablet Commonly known as: LASIX  Take 1 tablet by mouth once daily   methocarbamol  500 MG tablet Commonly known as: ROBAXIN  Take 1 tablet (500 mg total) by mouth every 6 (six) hours as needed for muscle spasms.   nitroGLYCERIN  0.4 MG SL tablet Commonly known as: Nitrostat  DISSOLVE ONE TABLET UNDER THE TONGUE EVERY 5 MINUTES AS NEEDED FOR CHEST PAIN.  DO NOT EXCEED A TOTAL OF 3 DOSES IN 15 MINUTES   nystatin  powder Commonly known as: nystatin  Apply 1 Application topically 3 (three) times daily. What changed:  when to take this reasons to take this   One A Day Women 50 Plus Tabs Take 1 tablet by mouth daily.   pantoprazole  40 MG tablet Commonly known as: PROTONIX  Take 1 tablet (40 mg total) by mouth daily.   potassium chloride  10 MEQ tablet Commonly known as: KLOR-CON  Take 1 tablet (10 mEq total) by mouth daily.        Discharge Exam: Filed Weights   08/28/23 1032  Weight: 83.9 kg   Physical Exam on Day of Discharge   General: Alert, cheerful, oriented X3  Oral cavity: moist mucous membranes  Neck: supple  Chest: clear to auscultation. No crackles, no wheezes  CVS: S1,S2 RRR. No murmurs  Abd: No distention, soft, non-tender. No masses palpable  Extr: No edema    Condition at discharge: stable  The results of significant diagnostics from this hospitalization (including imaging, microbiology, ancillary and laboratory) are listed below for reference.   Imaging Studies: DG Chest Portable 1 View Result Date: 08/28/2023 CLINICAL DATA:  Chest pain. EXAM: PORTABLE CHEST 1 VIEW COMPARISON:  07/11/2023 FINDINGS: Normal-sized heart.  Tortuous and partially calcified thoracic aorta. Stable post CABG changes. Stable left subclavian bipolar pacemaker leads. Clear lungs with normal vascularity. Soft tissue density overlying the right upper chest related to a skin fold. No definite airspace opacity. A previously demonstrated hiatal hernia is not well visualized. Mild thoracic spine degenerative changes. IMPRESSION: No acute abnormality. Electronically Signed   By: Catherin Closs M.D.   On: 08/28/2023 12:23    Microbiology: Results for orders placed or performed in visit on 06/29/23  Urine Culture     Status: None   Collection Time: 06/29/23  2:13 PM   Specimen: Urine  Result Value Ref Range Status   MICRO NUMBER: [id number]  Final   SPECIMEN QUALITY: Adequate  Final   Sample Source URINE  Final   STATUS: FINAL  Final   Result:   Final    Less than 10,000 CFU/mL of single Gram positive organism isolated. No further testing will be  performed. If clinically indicated, recollection using a method to minimize contamination, with prompt transfer to Urine Culture Transport Tube, is recommended.    Labs: CBC: Recent Labs  Lab 08/28/23 1040 08/28/23 1101 08/29/23 0603  WBC 7.2  --  7.8  NEUTROABS 4.7  --   --   HGB 10.5* 10.2* 11.3*  HCT 32.4* 30.0* 34.2*  MCV 97.0  --  95.0  PLT 152  --  163   Basic Metabolic Panel: Recent Labs  Lab 08/28/23 1040 08/28/23 1101 08/29/23 0603  NA 141 140 140  K 3.9 3.9 3.8  CL 110 108 106  CO2 20*  --  24  GLUCOSE 166* 169* 154*  BUN 34* 40* 29*  CREATININE 1.51* 1.70* 1.45*  CALCIUM  7.9*  --  8.3*   Liver Function Tests: No results for input(s): "AST", "ALT", "ALKPHOS", "BILITOT", "PROT", "ALBUMIN" in the last 168 hours. CBG: Recent Labs  Lab 08/28/23 1030 08/28/23 1659 08/28/23 2208  GLUCAP 187* 125* 168*    Discharge time spent: greater than 30 minutes.  Signed: MDALA-GAUSI, Skie Vitrano AGATHA, MD [facility] 08/29/2023

## 2023-08-29 NOTE — Progress Notes (Addendum)
 Patient Name: [name] Date of Encounter: 08/29/2023 [facility] Cardiologist: Arnoldo Lapping, MD   Interval Summary  .    No further episodes of chest pain.   Vital Signs .    Vitals:   08/28/23 2041 08/29/23 0024 08/29/23 0411 08/29/23 0829  BP: (!) 151/51 (!) 148/52 (!) 158/53   Pulse:  (!) 58    Resp: 20 (!) 25    Temp: 97.7 F (36.5 C) (!) 97.3 F (36.3 C) 98.3 F (36.8 C) 98 F (36.7 C)  TempSrc: Oral Oral Oral Oral  SpO2:  100%    Weight:      Height:        Intake/Output Summary (Last 24 hours) at 08/29/2023 0911 Last data filed at 08/29/2023 0411 Gross per 24 hour  Intake 120 ml  Output 650 ml  Net -530 ml      08/28/2023   10:32 AM 08/08/2023   10:42 AM 07/28/2023   11:44 AM  Last 3 Weights  Weight (lbs) 185 lb 183 lb 6.4 oz 183 lb 9.6 oz  Weight (kg) 83.915 kg 83.19 kg 83.28 kg      Telemetry/ECG    V paced - Personally Reviewed  Physical Exam .    GEN: No acute distress.   Neck: No JVD Cardiac: RRR, no murmurs, rubs, or gallops.  Respiratory: Clear to auscultation bilaterally. GI: Soft, nontender, non-distended  MS: No edema  Assessment & Plan .     89 y.o. female with a hx of CAD s/p CABG 2011 (last cath 12/02/2013 patent grafts), HFpEF, SSS s/p Medtronic PPM 2013, HTN, HLD, DM II, CKD stage III, PAD, Barrett's esophagus, carotid artery disease, and hypothyroidism who is being seen 08/28/2023 for the evaluation of chest pain at the request of Dr. Cleda Curly.   Chest pain -- presented with left sided chest pain/arm pain. hsTn 22>>24>>26. EKG V paced. No further episodes this morning -- echo pending, possible outpatient stress pending results  -- needs to ambulate with potential DC later based on results   CAD s/p CABG '11 -- Last cardiac catheterization 2017 with patent grafts -- Continue home Plavix  and Lipitor  SSS s/p PPM '13 -- follows with EP, family reported device was interrogated in the ED. No issues reported    Chronic HFpEF -- reports chronic LE edema, as baseline. Clear breath sounds on exam' -- continue coreg , lasix  40mg  daily   HTN -- elevated  -- continue coreg  12.5mg  BID, resume home amlodipine  5mg  daily  -- consider resuming home dose Entresto  prior to DC (suspect may need a lower dose)  HLD -- on statin  CKD stage III -- baseline around 1.4-1.6, noted at 1.45 today  For questions or updates, please contact [facility] Please consult [email or web address] for contact info under        Signed, Johnie Nailer, NP   Patient seen, examined. Available data reviewed. Agree with findings, assessment, and plan as outlined by Johnie Nailer, NP.  The patient is alert, oriented, elderly woman in no distress.  HEENT is normal.  Lungs are clear bilaterally.  Heart is regular rate and rhythm no murmur gallop, abdomen soft and nontender, extremities have trace edema in the right foot otherwise no significant swelling.  The patient has a troponin trend of 22, 24, and 26 which indicates a flat unremarkable trend in this elderly patient with chronic kidney disease.  There is no evidence of ACS.  Her chest pain is mostly atypical and does  not require further workup at this point.  I have reviewed her echo images.  The formal interpretation is pending, but she appears to have grossly normal LV function with no significant valvular disease.  I do not appreciate any regional wall motion abnormalities.  I think she is stable from a cardiac perspective for hospital discharge.  Will defer to the primary team if they feel like she needs further workup for the elevated D-dimer.  Clinically I do not have a high suspicion for thromboembolic disease as she appears very nontoxic with resolution of her symptoms (chest pain-free, oxygen saturation 100% on room air).  Arnoldo Lapping, M.D. 08/29/2023 11:06 AM

## 2023-08-29 NOTE — TOC CM/SW Note (Signed)
 Transition of Care [facility]) - Inpatient Brief Assessment   Patient Details  Name: [name] MRN: [id number] Date of Birth: Sep 06, 1932  Transition of Care [facility]) CM/SW Contact:    Cosimo Diones, RN Phone Number: 08/29/2023, 1:03 PM   Clinical Narrative: Patient presented for chest pain. PTA patient was from home with support of grandson. Case Manager received a secure chat that the patient would need HH PT/OT. Case Manager spoke with grandson and he did not have an agency choice. Case Manager submitted referral to [facility] and they can service the patient within 48 hours of transition home. Dietra Fraction was able to provide transportation home. No further needs identified at this time.    Transition of Care Asessment: Insurance and Status: Insurance coverage has been reviewed Patient has primary care physician: Yes Home environment has been reviewed: reviewed Prior/Current Home Services: No current home services Social Drivers of Health Review: SDOH reviewed no interventions necessary Readmission risk has been reviewed: Yes Transition of care needs: transition of care needs identified, TOC will continue to follow

## 2023-08-29 NOTE — Plan of Care (Signed)

## 2023-08-29 NOTE — Plan of Care (Signed)
 Problem: Education: Goal: Knowledge of General Education information will improve Description: Including pain rating scale, medication(s)/side effects and non-pharmacologic comfort measures 08/29/2023 1228 by Sunnie England, RN Outcome: Adequate for Discharge 08/29/2023 1228 by Sunnie England, RN Outcome: Adequate for Discharge 08/29/2023 1201 by Sunnie England, RN Outcome: Progressing   Problem: Health Behavior/Discharge Planning: Goal: Ability to manage health-related needs will improve 08/29/2023 1228 by Sunnie England, RN Outcome: Adequate for Discharge 08/29/2023 1228 by Sunnie England, RN Outcome: Adequate for Discharge 08/29/2023 1201 by Sunnie England, RN Outcome: Progressing   Problem: Clinical Measurements: Goal: Ability to maintain clinical measurements within normal limits will improve 08/29/2023 1228 by Sunnie England, RN Outcome: Adequate for Discharge 08/29/2023 1228 by Sunnie England, RN Outcome: Adequate for Discharge 08/29/2023 1201 by Sunnie England, RN Outcome: Progressing Goal: Will remain free from infection 08/29/2023 1228 by Sunnie England, RN Outcome: Adequate for Discharge 08/29/2023 1228 by Sunnie England, RN Outcome: Adequate for Discharge 08/29/2023 1201 by Sunnie England, RN Outcome: Progressing Goal: Diagnostic test results will improve 08/29/2023 1228 by Sunnie England, RN Outcome: Adequate for Discharge 08/29/2023 1228 by Sunnie England, RN Outcome: Adequate for Discharge 08/29/2023 1201 by Sunnie England, RN Outcome: Progressing Goal: Respiratory complications will improve 08/29/2023 1228 by Sunnie England, RN Outcome: Adequate for Discharge 08/29/2023 1228 by Sunnie England, RN Outcome: Adequate for Discharge 08/29/2023 1201 by Sunnie England, RN Outcome: Progressing Goal: Cardiovascular complication will be avoided 08/29/2023 1228 by Sunnie England, RN Outcome: Adequate for  Discharge 08/29/2023 1228 by Sunnie England, RN Outcome: Adequate for Discharge 08/29/2023 1201 by Sunnie England, RN Outcome: Progressing   Problem: Activity: Goal: Risk for activity intolerance will decrease 08/29/2023 1228 by Sunnie England, RN Outcome: Adequate for Discharge 08/29/2023 1228 by Sunnie England, RN Outcome: Adequate for Discharge 08/29/2023 1201 by Sunnie England, RN Outcome: Progressing   Problem: Nutrition: Goal: Adequate nutrition will be maintained 08/29/2023 1228 by Sunnie England, RN Outcome: Adequate for Discharge 08/29/2023 1228 by Sunnie England, RN Outcome: Adequate for Discharge 08/29/2023 1201 by Sunnie England, RN Outcome: Progressing   Problem: Coping: Goal: Level of anxiety will decrease 08/29/2023 1228 by Sunnie England, RN Outcome: Adequate for Discharge 08/29/2023 1228 by Sunnie England, RN Outcome: Adequate for Discharge 08/29/2023 1201 by Sunnie England, RN Outcome: Progressing   Problem: Elimination: Goal: Will not experience complications related to bowel motility 08/29/2023 1228 by Sunnie England, RN Outcome: Adequate for Discharge 08/29/2023 1228 by Sunnie England, RN Outcome: Adequate for Discharge 08/29/2023 1201 by Sunnie England, RN Outcome: Progressing Goal: Will not experience complications related to urinary retention 08/29/2023 1228 by Sunnie England, RN Outcome: Adequate for Discharge 08/29/2023 1228 by Sunnie England, RN Outcome: Adequate for Discharge 08/29/2023 1201 by Sunnie England, RN Outcome: Progressing   Problem: Pain Managment: Goal: General experience of comfort will improve and/or be controlled 08/29/2023 1228 by Sunnie England, RN Outcome: Adequate for Discharge 08/29/2023 1228 by Sunnie England, RN Outcome: Adequate for Discharge 08/29/2023 1201 by Sunnie England, RN Outcome: Progressing   Problem: Safety: Goal: Ability to remain free  from injury will improve 08/29/2023 1228 by Sunnie England, RN Outcome: Adequate for Discharge 08/29/2023 1228 by Sunnie England, RN Outcome: Adequate for Discharge 08/29/2023 1201 by Sunnie England, RN Outcome: Progressing   Problem: Skin Integrity: Goal: Risk for impaired  skin integrity will decrease 08/29/2023 1228 by Sunnie England, RN Outcome: Adequate for Discharge 08/29/2023 1228 by Sunnie England, RN Outcome: Adequate for Discharge 08/29/2023 1201 by Sunnie England, RN Outcome: Progressing

## 2023-08-29 NOTE — Care Management Obs Status (Signed)
 MEDICARE OBSERVATION STATUS NOTIFICATION   Patient Details  Name: [name] MRN: [id number] Date of Birth: 1932/06/11   Medicare Observation Status Notification Given:  Yes Moon/Obs letter information given by phon to the patient Daughter Mariah Shines would like document sent to [employer] 08/29/2023, 11:40 AM

## 2023-08-29 NOTE — Evaluation (Signed)
 Occupational Therapy Evaluation Patient Details Name: [name] MRN: [id number] DOB: 1932-09-23 Today's Date: 08/29/2023   History of Present Illness   89 yo female admitted with chest pain.  PMH: CAD s/p CABG 2011 (last cath 12/02/2013 patent grafts), HFpEF, SSS s/p Medtronic PPM 2013, HTN, HLD, DM II, CKD stage III, PAD, Barrett's esophagus, carotid artery disease, and hypothyroidism     Clinical Impressions PTA pt lives with her daughter (per pt she is deaf and does not assist very much). Pt's grandson helps intermittently with tub bench transfers and bathing. Pt uses a rollator for mobility and completes her own self-care. States she has a PCA 2 x/wk. Pt has not had recent falls, but does have a fall alert system in place. Pt able to mobilize ot [facility] and complete her self care with min A for LB ADL @ RW level. No complaints of chest pain during activity with VSS. Recommend HHOT/PT. Asked case manager to provide information on Meals on Wheels. All further OT to be addressed by HHOT.      If plan is discharge home, recommend the following:   A little help with bathing/dressing/bathroom;Assistance with cooking/housework;Assist for transportation     Functional Status Assessment   Patient has had a recent decline in their functional status and demonstrates the ability to make significant improvements in function in a reasonable and predictable amount of time.     Equipment Recommendations   None recommended by OT     Recommendations for Other Services         Precautions/Restrictions   Precautions Precautions: Fall     Mobility Bed Mobility Overal bed mobility: Needs Assistance Bed Mobility: Supine to Sit     Supine to sit: Min assist          Transfers Overall transfer level: Needs assistance   Transfers: Sit to/from Stand, Bed to chair/wheelchair/BSC Sit to Stand: Contact guard assist     Step pivot transfers: Contact guard assist     General  transfer comment: Increased time      Balance Overall balance assessment: Mild deficits observed, not formally tested                                         ADL either performed or assessed with clinical judgement   ADL Overall ADL's : Needs assistance/impaired Eating/Feeding: Modified independent   Grooming: Set up;Sitting   Upper Body Bathing: Set up;Sitting   Lower Body Bathing: Minimal assistance;Sit to/from stand   Upper Body Dressing : Set up;Sitting   Lower Body Dressing: Minimal assistance;Sit to/from stand   Toilet Transfer: Contact guard assist;Ambulation   Toileting- Clothing Manipulation and Hygiene: Contact guard assist       Functional mobility during ADLs: Contact guard assist;Rolling walker (2 wheels) General ADL Comments: uses a Sports administrator for ADL tasks     Vision Baseline Vision/History: 1 Wears glasses       Perception         Praxis         Pertinent Vitals/Pain Pain Assessment Pain Assessment: No/denies pain     Extremity/Trunk Assessment Upper Extremity Assessment Upper Extremity Assessment: Generalized weakness;Right hand dominant;LUE deficits/detail LUE Deficits / Details: L shoulder deficits at baseline   Lower Extremity Assessment Lower Extremity Assessment: Defer to PT evaluation   Cervical / Trunk Assessment Cervical / Trunk Assessment: Kyphotic   Communication  Cognition Arousal: Alert Behavior During Therapy: WFL for tasks assessed/performed Cognition: No apparent impairments                               Following commands: Intact       Cueing  General Comments          Exercises     Shoulder Instructions      Home Living Family/patient expects to be discharged to:: Private residence Living Arrangements: Children;Other relatives Available Help at Discharge: Available PRN/intermittently Type of Home: Apartment Home Access: Stairs to enter Entrance Stairs-Number of  Steps: 3 Entrance Stairs-Rails: Right Home Layout: One level     Bathroom Shower/Tub: Chief Strategy Officer: Standard Bathroom Accessibility: No   Home Equipment: Rollator (4 wheels);BSC/3in1;Tub bench (lift chair)          Prior Functioning/Environment Prior Level of Function : Needs assist;Independent/Modified Independent             Mobility Comments: uses rollator; family carrys it up/down stairs ADLs Comments: does most of her care; grandson helps with tub bench transfers and bathing at times; daughter is deaf and "doesn't help much"; fixes herself simple meals    OT Problem List: Decreased activity tolerance;Impaired balance (sitting and/or standing);Decreased safety awareness;Decreased knowledge of use of DME or AE;Cardiopulmonary status limiting activity   OT Treatment/Interventions:        OT Goals(Current goals can be found in the care plan section)   Acute Rehab OT Goals Patient Stated Goal: home today OT Goal Formulation: All assessment and education complete, DC therapy   OT Frequency:       Co-evaluation              AM-PAC OT "6 Clicks" Daily Activity     Outcome Measure Help from another person eating meals?: None Help from another person taking care of personal grooming?: A Little Help from another person toileting, which includes using toliet, bedpan, or urinal?: A Little Help from another person bathing (including washing, rinsing, drying)?: A Little Help from another person to put on and taking off regular upper body clothing?: A Little Help from another person to put on and taking off regular lower body clothing?: A Little 6 Click Score: 19   End of Session Equipment Utilized During Treatment: Gait belt;Rolling walker (2 wheels) Nurse Communication: Mobility status;Other (comment) (DC needs)  Activity Tolerance: Patient tolerated treatment well Patient left: in chair;with call bell/phone within reach;with chair alarm  set  OT Visit Diagnosis: Unsteadiness on feet (R26.81);Muscle weakness (generalized) (M62.81)                Time: 1127-1203 OT Time Calculation (min): 36 min Charges:  OT General Charges $OT Visit: 1 Visit OT Evaluation $OT Eval Low Complexity: 1 Low OT Treatments $Self Care/Home Management : 8-22 mins  Milburn Aliment, OT/L   Acute OT Clinical Specialist Acute Rehabilitation Services Pager [phone] Office [phone]   Detar North 08/29/2023, 1:15 PM

## 2023-08-29 NOTE — Progress Notes (Signed)
 Echocardiogram 2D Echocardiogram has been performed.  Emmaline Haring Yandel Zeiner RDCS 08/29/2023, 9:57 AM

## 2023-08-30 ENCOUNTER — Telehealth: Payer: Self-pay

## 2023-08-30 ENCOUNTER — Encounter: Payer: Self-pay | Admitting: Podiatrist

## 2023-08-30 ENCOUNTER — Ambulatory Visit: Admitting: Podiatrist

## 2023-08-30 DIAGNOSIS — L89611 Pressure ulcer of right heel, stage 1: Secondary | ICD-10-CM | POA: Diagnosis not present

## 2023-08-30 NOTE — Patient Instructions (Signed)
 [employer]-    [id number] [place]  [id number]  [phone]   HEEL PROTECTORS FOR BOTH HEELS (FOR SLEEPING/ RESTING)   TO PREVENT PRESSURE INJURY TO HEELS

## 2023-08-30 NOTE — Progress Notes (Signed)
 Chief Complaint  Patient presents with   Diabetes    "It's not hurting like it was."     HPI: Patient is 89 y.o. female who presents today for follow up of  pain in the right heel.  She states the pain has improved since using the heel protector for the right.  Relate the left heel is starting to feel similar.      Allergies  Allergen Reactions   Codeine Other (See Comments)    Hallucinations   Hydrocodone Other (See Comments)    Made her pass out   Penicillins Hives   Shellfish Allergy Hives   Sulfonamide Derivatives Nausea And Vomiting   Cipro  [Ciprofloxacin  Hcl] Nausea And Vomiting and Other (See Comments)    Syncope    Morphine  And Codeine Other (See Comments)    Hallucinations "Went crazy"   Tiazac [Diltiazem] Other (See Comments)    Bradycardia    Glucophage  [Metformin ] Diarrhea   Mevacor [Lovastatin] Other (See Comments)    Unknown reaction    Review of systems is negative except as noted in the HPI.  Denies nausea/ vomiting/ fevers/ chills or night sweats.   Denies difficulty breathing, denies calf pain or tenderness  Physical Exam  Patient is awake, alert, and oriented x 3.  In no acute distress.    Vascular status is intact with palpable pedal pulses DP and PT bilateral and capillary refill time less than 3 seconds bilateral.  No edema or erythema noted.   Neurological exam reveals epicritic and protective sensation grossly intact bilateral.   Dermatological exam reveals improvement in the bruising to the posterior aspect of the right heel.  Skin is intact.  No preulcerative lesion is seen.  Skin of the heel is now blanchable with pressure.  Left heel appears normal.  Some tenderness with direct pressure noted.    Assessment: Pressure injury right heel-  improved with offloading     Plan: Discussed exam findings with Monroe [place]. Recommended continued use of the offloading heel protector.  She would like to find a less bulky heel protector therefore I wrote a  recommendation for her to go to [facility] medical [facility] to see if they have a less bulky offloading boot for her heels.  She will continue to offload and will be seen prn.

## 2023-08-30 NOTE — Transitions of Care (Post Inpatient/ED Visit) (Unsigned)
   08/30/2023  Name: [name] MRN: [id number] DOB: December 26, 1932  Today's TOC FU Call Status: Today's TOC FU Call Status:: Unsuccessful Call (1st Attempt) Unsuccessful Call (1st Attempt) Date: 08/30/23  Attempted to reach the patient regarding the most recent Inpatient/ED visit.  Follow Up Plan: Additional outreach attempts will be made to reach the patient to complete the Transitions of Care (Post Inpatient/ED visit) call.   Signature Darrall Ellison, LPN [facility] Nurse Health Advisor Direct Dial [phone]

## 2023-09-05 ENCOUNTER — Other Ambulatory Visit

## 2023-09-05 ENCOUNTER — Encounter: Payer: Self-pay | Admitting: Family Medicine

## 2023-09-05 ENCOUNTER — Ambulatory Visit (INDEPENDENT_AMBULATORY_CARE_PROVIDER_SITE_OTHER): Admitting: Family Medicine

## 2023-09-05 VITALS — BP 134/70 | HR 60 | Temp 97.8°F | Resp 18 | Ht 66.0 in | Wt 182.6 lb

## 2023-09-05 DIAGNOSIS — I5032 Chronic diastolic (congestive) heart failure: Secondary | ICD-10-CM

## 2023-09-05 DIAGNOSIS — R197 Diarrhea, unspecified: Secondary | ICD-10-CM

## 2023-09-05 DIAGNOSIS — E11620 Type 2 diabetes mellitus with diabetic dermatitis: Secondary | ICD-10-CM | POA: Diagnosis not present

## 2023-09-05 DIAGNOSIS — G20C Parkinsonism, unspecified: Secondary | ICD-10-CM

## 2023-09-05 DIAGNOSIS — E785 Hyperlipidemia, unspecified: Secondary | ICD-10-CM | POA: Diagnosis not present

## 2023-09-05 DIAGNOSIS — I10 Essential (primary) hypertension: Secondary | ICD-10-CM | POA: Diagnosis not present

## 2023-09-05 DIAGNOSIS — M109 Gout, unspecified: Secondary | ICD-10-CM

## 2023-09-05 DIAGNOSIS — I2510 Atherosclerotic heart disease of native coronary artery without angina pectoris: Secondary | ICD-10-CM

## 2023-09-05 DIAGNOSIS — R300 Dysuria: Secondary | ICD-10-CM

## 2023-09-05 DIAGNOSIS — N1832 Chronic kidney disease, stage 3b: Secondary | ICD-10-CM

## 2023-09-05 LAB — COMPREHENSIVE METABOLIC PANEL WITH GFR
ALT: 14 U/L (ref 0–35)
AST: 17 U/L (ref 0–37)
Albumin: 3.8 g/dL (ref 3.5–5.2)
Alkaline Phosphatase: 93 U/L (ref 39–117)
BUN: 43 mg/dL — ABNORMAL HIGH (ref 6–23)
CO2: 28 meq/L (ref 19–32)
Calcium: 8.6 mg/dL (ref 8.4–10.5)
Chloride: 103 meq/L (ref 96–112)
Creatinine, Ser: 1.58 mg/dL — ABNORMAL HIGH (ref 0.40–1.20)
GFR: 28.98 mL/min — ABNORMAL LOW (ref >60.00)
Glucose, Bld: 145 mg/dL — ABNORMAL HIGH (ref 70–99)
Potassium: 4.3 meq/L (ref 3.5–5.1)
Sodium: 142 meq/L (ref 135–145)
Total Bilirubin: 0.8 mg/dL (ref 0.2–1.2)
Total Protein: 6.5 g/dL (ref 6.0–8.3)

## 2023-09-05 LAB — CBC WITH DIFFERENTIAL/PLATELET
Basophils Absolute: 0.1 10*3/uL (ref 0.0–0.1)
Basophils Relative: 0.7 % (ref 0.0–3.0)
Eosinophils Absolute: 0.2 10*3/uL (ref 0.0–0.7)
Eosinophils Relative: 2.6 % (ref 0.0–5.0)
HCT: 36 % (ref 36.0–46.0)
Hemoglobin: 12.1 g/dL (ref 12.0–15.0)
Lymphocytes Relative: 15.9 % (ref 12.0–46.0)
Lymphs Abs: 1.3 10*3/uL (ref 0.7–4.0)
MCHC: 33.5 g/dL (ref 30.0–36.0)
MCV: 92.7 fl (ref 78.0–100.0)
Monocytes Absolute: 0.7 10*3/uL (ref 0.1–1.0)
Monocytes Relative: 7.9 % (ref 3.0–12.0)
Neutro Abs: 6.1 10*3/uL (ref 1.4–7.7)
Neutrophils Relative %: 72.9 % (ref 43.0–77.0)
Platelets: 200 10*3/uL (ref 150.0–400.0)
RBC: 3.89 Mil/uL (ref 3.87–5.11)
RDW: 13 % (ref 11.5–15.5)
WBC: 8.3 10*3/uL (ref 4.0–10.5)

## 2023-09-05 LAB — POC URINALSYSI DIPSTICK (AUTOMATED)
Bilirubin, UA: NEGATIVE
Blood, UA: NEGATIVE
Glucose, UA: NEGATIVE
Ketones, UA: NEGATIVE
Nitrite, UA: NEGATIVE
Protein, UA: NEGATIVE
Spec Grav, UA: 1.01 (ref 1.010–1.025)
Urobilinogen, UA: 0.2 U/dL
pH, UA: 5 (ref 5.0–8.0)

## 2023-09-05 LAB — MICROALBUMIN / CREATININE URINE RATIO
Creatinine,U: 75.7 mg/dL
Microalb Creat Ratio: 86.8 mg/g — ABNORMAL HIGH (ref 0.0–30.0)
Microalb, Ur: 6.6 mg/dL — ABNORMAL HIGH (ref 0.0–1.9)

## 2023-09-05 LAB — LIPID PANEL
Cholesterol: 131 mg/dL (ref 0–200)
HDL: 67.4 mg/dL (ref >39.00)
LDL Cholesterol: 39 mg/dL (ref 0–99)
NonHDL: 63.24
Total CHOL/HDL Ratio: 2
Triglycerides: 121 mg/dL (ref 0.0–149.0)
VLDL: 24.2 mg/dL (ref 0.0–40.0)

## 2023-09-05 LAB — HEMOGLOBIN A1C: Hgb A1c MFr Bld: 7.4 % — ABNORMAL HIGH (ref 4.6–6.5)

## 2023-09-05 LAB — URIC ACID: Uric Acid, Serum: 9.9 mg/dL — ABNORMAL HIGH (ref 2.4–7.0)

## 2023-09-05 NOTE — Assessment & Plan Note (Signed)
?   Etiology Check labs  Check ifob Con't protonix   Consider GI

## 2023-09-05 NOTE — Transitions of Care (Post Inpatient/ED Visit) (Signed)
   09/05/2023  Name: [name] MRN: [id number] DOB: 1933-02-07  Today's TOC FU Call Status: Today's TOC FU Call Status:: Unsuccessful Call (1st Attempt) Unsuccessful Call (1st Attempt) Date: 08/30/23  Attempted to reach the patient regarding the most recent Inpatient/ED visit.  Follow Up Plan: No further outreach attempts will be made at this time. We have been unable to contact the patient. Patient already seen in office Signature Darrall Ellison, LPN [facility] Nurse Health Advisor Direct Dial [phone]

## 2023-09-05 NOTE — Assessment & Plan Note (Signed)
 Per cardiology

## 2023-09-05 NOTE — Progress Notes (Signed)
 Established Patient Office Visit  Subjective   Patient ID: [name], female    DOB: 09-04-1932  Age: 89 y.o. MRN: [id number]  Chief Complaint  Patient presents with   Hospitalization Follow-up    HPI Discussed the use of AI scribe software for clinical note transcription with the patient, who gave verbal consent to proceed.  History of Present Illness [name] is an 89 year old female who presents with chronic diarrhea and recent loss of control.  She has experienced chronic diarrhea for several months, with a recent loss of control over the past week or two. The diarrhea occurs three to four times a day, primarily in the morning or at night. She feels weak and unwell due to this issue. There is no blood in the stool, and no vomiting. She takes Imodium with each episode, which provides some relief, but she still experiences urgency with little warning.  She denies any recent changes in her medications and has not been on antibiotics recently. Her current medications include pantoprazole  for her stomach and extra strength Tylenol  in the morning and at night, which she believes helps with her symptoms.  She mentions having a little chest pain yesterday, which lasted only a few minutes and resolved on its own. She has not contacted her cardiologist about this.  She also reports pain in her elbow, which occurs when she pushes herself up from a chair. She has taken Tylenol  and methocarbamol  for this, which provides some relief.   Patient Active Problem List   Diagnosis Date Noted   Non-pressure chronic ulcer of other part of left foot with unspecified severity (HCC) 07/01/2023   Parkinsonism, unspecified Parkinsonism type (HCC) 07/01/2023   Tinea corporis 07/01/2023   Dermatitis associated with moisture 03/03/2023   Open wound of skin 03/03/2023   Acute pain of left hip 09/01/2022   Need for pneumococcal 20-valent conjugate vaccination 08/22/2022   Rash 05/27/2022    Tremor 05/27/2022   Coronary artery disease involving native coronary artery of native heart without angina pectoris 09/14/2021   Pain due to onychomycosis of toenails of both feet 01/15/2021   Type 2 diabetes mellitus with diabetic dermatitis, without long-term current use of insulin  (HCC) 01/15/2021   Palpitations 01/15/2018   Cardiac pacemaker in situ 01/15/2018   Heel spur, left 09/13/2017   Left knee pain 08/23/2017   CKD (chronic kidney disease), stage III (HCC) 12/04/2016   Chest pain 11/23/2015   Type I (juvenile type) diabetes mellitus with renal manifestations, not stated as uncontrolled(250.41) 04/27/2012   Insulin  dependent diabetes mellitus with complications 04/27/2012   AKI (acute kidney injury) (HCC) 03/05/2012   Diarrhea 03/05/2012   Carotid artery disease (HCC) 08/11/2010   Left shoulder pain 07/08/2010   Preventative health care 07/08/2010   HOARSENESS 06/04/2010   Pruritus 05/11/2010   ANEMIA-NOS 04/02/2010   (HFpEF) heart failure with preserved ejection fraction (HCC) 02/11/2010   SINUS BRADYCARDIA 10/30/2009   Allergic rhinitis 09/11/2009   Backache 09/11/2009   MUSCLE STRAIN, RIGHT BUTTOCK 09/11/2009   Herpes zoster 05/11/2009   SHOULDER PAIN, LEFT 12/29/2008   Proteinuria 12/12/2008   Abdominal pain 09/24/2007   Hx of CABG 05/03/2007   CHEST PAIN 04/20/2007   Dizziness 02/28/2007   Anxiety state 02/15/2007   GERD 02/15/2007   Gastroparesis 02/15/2007   DISC DISEASE, CERVICAL 02/15/2007   DISC DISEASE, LUMBAR 02/15/2007   SPINAL STENOSIS, LUMBAR 02/15/2007   PERIPHERAL EDEMA 02/15/2007   Personal History of Other Diseases of  Digestive Disease 02/15/2007   Hyperlipidemia LDL goal <70 01/01/2007   GOUT 01/01/2007   Morbid obesity (HCC) 01/01/2007   DEPRESSION 01/01/2007   PERIPHERAL VASCULAR DISEASE 01/01/2007   Diverticulosis of colon 01/01/2007   Osteoarthritis 01/01/2007   LOW BACK PAIN 01/01/2007   Disorder of bone and cartilage 01/01/2007    Essential hypertension 10/26/2006   Past Medical History:  Diagnosis Date   Allergic rhinitis    Anemia    Anxiety    Barrett esophagus    CAD (coronary artery disease) 2009   a. Multivessel s/p PCI w/DES 2009 // b. s/p CABG 2011  //  c. LHC 8/15: pLAD 95 ISR, LCx 100, pOM1 40, dRCA 100, S-OM1/OM2 ok, S-D1 ok, S-PDA ok, L-LAD ok, EF 60%   Carotid artery disease (HCC)    a. Carotid US  9/15: RICA 1-39%; LICA 40-59% >> FU 1 year  //  b. Carotid US  9/17: R 1-39%, L 40-59% >> FU 1 year   Chronic diastolic heart failure (HCC)    CKD (chronic kidney disease), stage II    GFR 60-89 ml/min   Depression    Disc disease, degenerative, cervical    Diverticulosis    Gastroparesis    GERD (gastroesophageal reflux disease)    Gout    H/O hiatal hernia    Helicobacter pylori gastritis    History of echocardiogram    a. Echo 11/13: EF 55% to 60%. Grade 2 diastolic dysfunction, MAC, trivial MR, mild LAE, normal RVSF, mild RAE, PASP 39 mmHg  //  b. 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   Type II or unspecified type diabetes mellitus without mention of complication, not stated as uncontrolled    Past Surgical History:  Procedure Laterality Date   ABDOMINAL HYSTERECTOMY     CARDIAC CATHETERIZATION     2011  DR COOPER (APPT NEXT WEEK)   CHOLECYSTECTOMY     CORONARY ARTERY BYPASS GRAFT  2011   LIMA-LAD, SVG-DIAG, SVG-OM1-OM2, SVG-PDA   CORONARY STENT PLACEMENT     Drug-eluting stent to the left anterior descending, circumflex and right coronary artery in Jan  2009   EYE SURGERY     BIL CATARACT REMOVAL 06/2010   LEFT HEART CATHETERIZATION WITH CORONARY ANGIOGRAM N/A 12/02/2013   Procedure: LEFT HEART CATHETERIZATION WITH CORONARY ANGIOGRAM;  Surgeon: Mickiel Albany, MD;  Location[facility] C[facility] LAB;  Service: Cardiovascular;  Laterality: N/A;   OVARIAN CYST REMOVAL     PACEMAKER INSERTION  03/06/12   MDT Adapta L implanted by Dr Nunzio Belch for SSS   PERMANENT PACEMAKER INSERTION N/A 03/06/2012   Procedure: PERMANENT PACEMAKER INSERTION;  Surgeon: Jolly Needle, MD;  Location: [facility] CATH LAB;  Service: Cardiovascular;  Laterality: N/A;   SHOULDER ARTHROSCOPY  06/16/2011   Procedure: ARTHROSCOPY SHOULDER;  Surgeon: Jonna Netter, MD;  Location[facility] OR;  Service: Orthopedics;  Laterality: Left;  LEFT SHOULDER ARTHROSCOPY ACROMIALPLASTY, POSSIBLE MINI OPEN CUFF REPAIR    TUBAL LIGATION  Social History   Tobacco Use   Smoking status: Former    Current packs/day: 0.00    Average packs/day: 0.3 packs/day for 28.0 years (7.0 ttl pk-yrs)    Types: Cigarettes    Start date: 85    Quit date: 33    Years since quitting: 31.4   Smokeless tobacco: Never   Tobacco comments:    quit 30 yrs ago  Vaping Use   Vaping status: Never Used  Substance Use Topics   Alcohol use: No   Drug use: No   Social History   Socioeconomic History   Marital status: Widowed    Spouse name: Not on file   Number of children: Not on file   Years of education: Not on file   Highest education level: Not on file  Occupational History   Occupation: RETIRED LPN    Comment: worked as [employer]  Tobacco Use   Smoking status: Former    Current packs/day: 0.00    Average packs/day: 0.3 packs/day for 28.0 years (7.0 ttl pk-yrs)    Types: Cigarettes    Start date: 62    Quit date: 59    Years since quitting: 31.4   Smokeless tobacco: Never   Tobacco comments:    quit 30 yrs ago  Vaping Use   Vaping status: Never Used  Substance and Sexual Activity   Alcohol use: No   Drug  use: No   Sexual activity: Not on file  Other Topics Concern   Not on file  Social History Narrative   Widowed 2004.., Lives with daughter and grand son ,Family history is negative for premature coronary artery disease. Mother died at age 62 with heart disease in her later years, father died at age 37 from a stroke.Aaron AasShe  has 8 siblings, none of whom have coronary artery disease.   Social Drivers of Corporate investment banker Strain: Low Risk  (11/02/2022)   Overall Financial Resource Strain (CARDIA)    Difficulty of Paying Living Expenses: Not hard at all  Food Insecurity: No Food Insecurity (08/28/2023)   Hunger Vital Sign    Worried About Running Out of Food in the Last Year: Never true    Ran Out of Food in the Last Year: Never true  Transportation Needs: No Transportation Needs (08/28/2023)   PRAPARE - Administrator, Civil Service (Medical): No    Lack of Transportation (Non-Medical): No  Physical Activity: Insufficiently Active (11/02/2022)   Exercise Vital Sign    Days of Exercise per Week: 5 days    Minutes of Exercise per Session: 20 min  Stress: No Stress Concern Present (11/02/2022)   [employer] of Occupational Health - Occupational Stress Questionnaire    Feeling of Stress : Only a little  Social Connections: Moderately Isolated (08/28/2023)   Social Connection and Isolation Panel [NHANES]    Frequency of Communication with Friends and Family: More than three times a week    Frequency of Social Gatherings with Friends and Family: Once a week    Attends Religious Services: More than 4 times per year    Active Member of [employer] or Organizations: No    Attends Banker Meetings: Never    Marital Status: Widowed  Intimate Partner Violence: Not At Risk (08/28/2023)   Humiliation, Afraid, Rape, and Kick questionnaire    Fear of Current or Ex-Partner: No    Emotionally Abused: No    Physically Abused: No    Sexually Abused: No  Family Status   Relation Name Status   Mother  Deceased       Old Age   Father  Deceased   Sister  Deceased   Sister  Deceased   Brother  Alive   Brother  Alive   Brother  Deceased   Brother  Deceased   Brother  Deceased   Brother  Deceased   MGM  Deceased   MGF  Deceased   PGM  Deceased   PGF  Deceased   Daughter  Corporate treasurer  Alive   Daughter  Alive   Son  Alive   Son  Deceased   Other  (Not Specified)  No partnership data on file   Family History  Problem Relation Age of Onset   Diabetes Mother    Hypertension Mother    Heart attack Mother    Stroke Father    Cancer Brother    Deafness Daughter    Other Son        cerebral edema at 18 months   Coronary artery disease Other    Allergies  Allergen Reactions   Codeine Other (See Comments)    Hallucinations   Hydrocodone Other (See Comments)    Made her pass out   Penicillins Hives   Shellfish Allergy Hives   Sulfonamide Derivatives Nausea And Vomiting   Cipro  [Ciprofloxacin  Hcl] Nausea And Vomiting and Other (See Comments)    Syncope    Morphine  And Codeine Other (See Comments)    Hallucinations "Went crazy"   Tiazac [Diltiazem] Other (See Comments)    Bradycardia    Glucophage  [Metformin ] Diarrhea   Mevacor [Lovastatin] Other (See Comments)    Unknown reaction      ROS    Objective:     BP 134/70 (BP Location: Left Arm, Patient Position: Sitting, Cuff Size: Large)   Pulse 60   Temp 97.8 F (36.6 C) (Oral)   Resp 18   Ht 5\' 6"  (1.676 m)   Wt 182 lb 9.6 oz (82.8 kg)   SpO2 97%   BMI 29.47 kg/m  BP Readings from Last 3 Encounters:  09/05/23 134/70  08/29/23 (!) 158/53  07/11/23 (!) 156/59   Wt Readings from Last 3 Encounters:  09/05/23 182 lb 9.6 oz (82.8 kg)  08/28/23 185 lb (83.9 kg)  08/08/23 183 lb 6.4 oz (83.2 kg)   SpO2 Readings from Last 3 Encounters:  09/05/23 97%  08/29/23 100%  07/11/23 98%      Physical Exam Vitals and nursing note reviewed.  Constitutional:      General:  She is not in acute distress.    Appearance: Normal appearance. She is well-developed.  HENT:     Head: Normocephalic and atraumatic.  Eyes:     General: No scleral icterus.       Right eye: No discharge.        Left eye: No discharge.  Cardiovascular:     Rate and Rhythm: Normal rate and regular rhythm.     Heart sounds: No murmur heard. Pulmonary:     Effort: Pulmonary effort is normal. No respiratory distress.     Breath sounds: Normal breath sounds.  Musculoskeletal:        General: Normal range of motion.     Cervical back: Normal range of motion and neck supple.     Right lower leg: No edema.     Left lower leg: No edema.  Skin:    General: Skin is warm and  dry.  Neurological:     Mental Status: She is alert and oriented to person, place, and time.  Psychiatric:        Mood and Affect: Mood normal.        Behavior: Behavior normal.        Thought Content: Thought content normal.        Judgment: Judgment normal.      Results for orders placed or performed in visit on 09/05/23  POCT Urinalysis Dipstick (Automated)  Result Value Ref Range   Color, UA Yellow    Clarity, UA Clear    Glucose, UA Negative Negative   Bilirubin, UA Negative    Ketones, UA Negative    Spec Grav, UA 1.010 1.010 - 1.025   Blood, UA Negative    pH, UA 5.0 5.0 - 8.0   Protein, UA Negative Negative   Urobilinogen, UA 0.2 0.2 or 1.0 E.U./dL   Nitrite, UA Negative    Leukocytes, UA Small (1+) (A) Negative    Last CBC Lab Results  Component Value Date   WBC 7.8 08/29/2023   HGB 11.3 (L) 08/29/2023   HCT 34.2 (L) 08/29/2023   MCV 95.0 08/29/2023   MCH 31.4 08/29/2023   RDW 11.9 08/29/2023   PLT 163 08/29/2023   Last metabolic panel Lab Results  Component Value Date   GLUCOSE 154 (H) 08/29/2023   NA 140 08/29/2023   K 3.8 08/29/2023   CL 106 08/29/2023   CO2 24 08/29/2023   BUN 29 (H) 08/29/2023   CREATININE 1.45 (H) 08/29/2023   GFRNONAA 35 (L) 08/29/2023   CALCIUM  8.3 (L)  08/29/2023   PHOS 3.0 03/06/2012   PROT 6.6 04/17/2023   ALBUMIN 3.9 04/17/2023   BILITOT 0.6 04/17/2023   ALKPHOS 118 (H) 04/17/2023   AST 18 04/17/2023   ALT 21 04/17/2023   ANIONGAP 10 08/29/2023   Last lipids Lab Results  Component Value Date   CHOL 145 04/17/2023   HDL 69.40 04/17/2023   LDLCALC 58 04/17/2023   LDLDIRECT 82.5 09/11/2009   TRIG 86.0 04/17/2023   CHOLHDL 2 04/17/2023   Last hemoglobin A1c Lab Results  Component Value Date   HGBA1C 7.0 (H) 04/17/2023   Last thyroid  functions Lab Results  Component Value Date   TSH 4.78 03/31/2022   Last vitamin D  Lab Results  Component Value Date   VD25OH 41.98 03/31/2022   Last vitamin B12 and Folate Lab Results  Component Value Date   VITAMINB12 398 06/29/2023      The ASCVD Risk score (Arnett DK, et al., 2019) failed to calculate for the following reasons:   The 2019 ASCVD risk score is only valid for ages 64 to 71   Risk score cannot be calculated because patient has a medical history suggesting prior/existing ASCVD    Assessment & Plan:   Problem List Items Addressed This Visit       Unprioritized   Type 2 diabetes mellitus with diabetic dermatitis, without long-term current use of insulin  (HCC) - Primary   Relevant Orders   Lipid panel   CBC with Differential/Platelet   Comprehensive metabolic panel with GFR   Hemoglobin A1c   Microalbumin / creatinine urine ratio   Parkinsonism, unspecified Parkinsonism type (HCC)   Hyperlipidemia LDL goal <70 (Chronic)   Relevant Orders   Lipid panel   CBC with Differential/Platelet   Comprehensive metabolic panel with GFR   Hemoglobin A1c   Microalbumin / creatinine urine ratio   Essential  hypertension (Chronic)   Relevant Orders   Lipid panel   CBC with Differential/Platelet   Comprehensive metabolic panel with GFR   Hemoglobin A1c   Microalbumin / creatinine urine ratio   Diarrhea   ? Etiology Check labs  Check ifob Con't protonix   Consider  GI      Relevant Orders   Cdiff NAA+O+P+Stool Culture   H. pylori antigen, stool   Fecal occult blood, imunochemical   POCT Urinalysis Dipstick (Automated) (Completed)   Coronary artery disease involving native coronary artery of native heart without angina pectoris (Chronic)   Check labs  F/u cardiology      CKD (chronic kidney disease), stage III (HCC) (Chronic)   Check labs       Relevant Orders   Comprehensive metabolic panel with GFR   (HFpEF) heart failure with preserved ejection fraction (HCC) (Chronic)   Per cardiology      Other Visit Diagnoses       Gouty arthritis of left great toe       Relevant Orders   Uric acid     Assessment and Plan Assessment & Plan Diarrhea   Chronic diarrhea has worsened recently, with loss of control over the past week or two. There are no recent medication changes, antibiotic use, blood in stool, vomiting, or abdominal pain. Diarrhea occurs 3-4 times daily, mainly in the morning or at night. Imodium is used as needed. Lab tests for stool specimens are ordered. A gastroenterology referral will be considered based on lab results. If symptoms worsen, medication to slow diarrhea may be prescribed, but it is important not to slow it if it is potentially clearing something from the body.  Chest pain   She experiences intermittent chest pain, with a recent episode lasting only a few minutes. Despite an emergency room recommendation, there has been no recent cardiologist follow-up. A cardiology appointment is scheduled for July. She is encouraged to follow up with a cardiologist before the July appointment if chest pain persists.  Elbow pain   Elbow pain occurs without swelling or trauma, particularly when pushing up from a chair. Tylenol  provides some relief. There is no indication of acute injury or inflammation. She should continue Tylenol  as needed and report if the pain worsens or if further evaluation is desired.    No follow-ups on  file.    Hamzah Savoca R Lowne Chase, DO

## 2023-09-05 NOTE — Addendum Note (Signed)
 Addended by: Marylou Sobers D on: 09/05/2023 05:17 PM   Modules accepted: Orders

## 2023-09-05 NOTE — Addendum Note (Signed)
 Addended by: Marylou Sobers D on: 09/05/2023 05:00 PM   Modules accepted: Orders

## 2023-09-05 NOTE — Assessment & Plan Note (Signed)
 Check labs

## 2023-09-05 NOTE — Assessment & Plan Note (Signed)
Check labs F/u cardiology 

## 2023-09-05 NOTE — Addendum Note (Signed)
 Addended by: Marylou Sobers D on: 09/05/2023 05:03 PM   Modules accepted: Orders

## 2023-09-06 ENCOUNTER — Telehealth: Payer: Self-pay

## 2023-09-06 ENCOUNTER — Ambulatory Visit: Payer: Self-pay

## 2023-09-06 NOTE — Telephone Encounter (Signed)
 Copied from CRM [id number]. Topic: Clinical - Medication Question >> Sep 06, 2023  3:39 PM Caliyah H wrote: Reason for CRM: Patient's grandson called stating the patient is wondering if she can take Aspirin  81mg .

## 2023-09-07 ENCOUNTER — Other Ambulatory Visit: Payer: Self-pay | Admitting: Family Medicine

## 2023-09-07 LAB — URINE CULTURE
MICRO NUMBER:: 16502239
SPECIMEN QUALITY:: ADEQUATE

## 2023-09-08 NOTE — Telephone Encounter (Signed)
Attempted to call patient. VM not set up.

## 2023-09-10 ENCOUNTER — Other Ambulatory Visit: Payer: Self-pay | Admitting: Family Medicine

## 2023-09-10 DIAGNOSIS — N390 Urinary tract infection, site not specified: Secondary | ICD-10-CM

## 2023-09-10 MED ORDER — NITROFURANTOIN MONOHYD MACRO 100 MG PO CAPS
100.0000 mg | ORAL_CAPSULE | Freq: Two times a day (BID) | ORAL | 0 refills | Status: DC
Start: 2023-09-10 — End: 2023-12-15

## 2023-09-11 ENCOUNTER — Other Ambulatory Visit: Payer: Self-pay | Admitting: Family Medicine

## 2023-09-11 ENCOUNTER — Other Ambulatory Visit: Payer: Self-pay | Admitting: Licensed Clinical Social Worker

## 2023-09-11 ENCOUNTER — Telehealth: Payer: Self-pay | Admitting: *Deleted

## 2023-09-11 DIAGNOSIS — I5032 Chronic diastolic (congestive) heart failure: Secondary | ICD-10-CM

## 2023-09-11 DIAGNOSIS — E11620 Type 2 diabetes mellitus with diabetic dermatitis: Secondary | ICD-10-CM

## 2023-09-11 DIAGNOSIS — N1832 Chronic kidney disease, stage 3b: Secondary | ICD-10-CM

## 2023-09-11 DIAGNOSIS — G20C Parkinsonism, unspecified: Secondary | ICD-10-CM

## 2023-09-11 NOTE — Patient Instructions (Signed)
 Visit Information  Thank you for taking time to visit with me today. Please don't hesitate to contact me if I can be of assistance to you before our next scheduled appointment.  Your next care management appointment is by telephone on 6/117/2025 at 10:30 am   Please call the care guide team at [phone] if you need to cancel, schedule, or reschedule an appointment.   Please call the Suicide and Crisis Lifeline: 988 go to  [facility] [address], [place] [phone]) call 911 if you are experiencing a Mental Health or Behavioral Health Crisis or need someone to talk to.  Jonda Neighbours, PhD [facility], [facility] Social Worker Direct Dial: [phone]  Fax: [phone]

## 2023-09-11 NOTE — Telephone Encounter (Signed)
 Pt has wellness visit scheduled on 11/07/23. CMA will be out of the office that week. Attempted to reach pt by phone to r/s appt. Voicemail was not set up to receive message. Mychart is not activated.  Mailed letter to pt.

## 2023-09-11 NOTE — Patient Outreach (Signed)
 Complex Care Management   Visit Note  09/11/2023  Name:  [name] MRN: [id number] DOB: 12-28-1932  Situation: Referral received for Complex Care Management related to order from [facility] I obtained verbal consent from Patient.  Visit completed with patient  on the phone  Background:   Past Medical History:  Diagnosis Date   Allergic rhinitis    Anemia    Anxiety    Barrett esophagus    CAD (coronary artery disease) 2009   a. Multivessel s/p PCI w/DES 2009 // b. s/p CABG 2011  //  c. LHC 8/15: pLAD 95 ISR, LCx 100, pOM1 40, dRCA 100, S-OM1/OM2 ok, S-D1 ok, S-PDA ok, L-LAD ok, EF 60%   Carotid artery disease (HCC)    a. Carotid US  9/15: RICA 1-39%; LICA 40-59% >> FU 1 year  //  b. Carotid US  9/17: R 1-39%, L 40-59% >> FU 1 year   Chronic diastolic heart failure (HCC)    CKD (chronic kidney disease), stage II    GFR 60-89 ml/min   Depression    Disc disease, degenerative, cervical    Diverticulosis    Gastroparesis    GERD (gastroesophageal reflux disease)    Gout    H/O hiatal hernia    Helicobacter pylori gastritis    History of echocardiogram    a. Echo 11/13: EF 55% to 60%. Grade 2 diastolic dysfunction, MAC, trivial MR, mild LAE, normal RVSF, mild RAE, PASP 39 mmHg  //  b. 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   Type II or unspecified type diabetes mellitus without mention of complication, not stated as uncontrolled     Assessment: Patient is still in need of a HHA and waiting for the PCP to call her to let her know where the referral  was sent    SDOH Screenings   Food Insecurity: No Food Insecurity (08/28/2023)  Housing: Low Risk  (08/28/2023)  Transportation Needs: No Transportation Needs (08/28/2023)  Utilities: Not At Risk (08/28/2023)  Alcohol Screen: Low Risk  (11/02/2022)  Depression (PHQ2-9): Low Risk  (01/02/2023)  Financial Resource Strain: Low Risk  (11/02/2022)  Physical Activity: Insufficiently Active (11/02/2022)  Social Connections: Moderately Isolated (08/28/2023)  Stress: No Stress Concern Present (11/02/2022)  Tobacco Use: Medium Risk (09/05/2023)  Health Literacy: Adequate Health Literacy (11/02/2022)      Recommendation:   none  Follow Up Plan:   Telephone follow up appointment date/time:  09/26/2023 at 10:30 am  Jonda Neighbours, PhD [facility], [facility] Social Worker Direct Dial: [phone]  Fax: [phone]

## 2023-09-18 ENCOUNTER — Ambulatory Visit: Payer: Self-pay

## 2023-09-18 NOTE — Telephone Encounter (Signed)
 FYI Only or Action Required?: Action required by provider  Patient was last seen in primary care on 09/05/2023 by [name], [name], DO. Called Nurse Triage reporting Foot Pain. Symptoms began several days ago. Interventions attempted: OTC medications: Tylenol . Symptoms are: gradually worsening.  Triage Disposition: See Physician Within 24 Hours  Patient/caregiver understands and will follow disposition?: YesCopied from CRM [id number]. Topic: Clinical - Red Word Triage >> Sep 18, 2023 11:38 AM [name] E wrote: [facility] that prompted transfer to Nurse Triage: Spot on right foot. Patient is a diabetic and is noticing a bruised/discolored spot on foot, been there for a few days. Spot is also painful. Reason for Disposition  [1] Redness of the skin AND [2] no fever  Answer Assessment - Initial Assessment Questions 1. ONSET: "When did the pain start?"      3- 4 days ago  2. LOCATION: "Where is the pain located?"      Right heel  3. PAIN: "How bad is the pain?"    (Scale 1-10; or mild, moderate, severe)  - MILD (1-3): doesn't interfere with normal activities.   - MODERATE (4-7): interferes with normal activities (e.g., work or school) or awakens from sleep, limping.   - SEVERE (8-10): excruciating pain, unable to do any normal activities, unable to walk.     8 5. CAUSE: "What do you think is causing the foot pain?"     Diabetes- pressure sore 6. OTHER SYMPTOMS: "Do you have any other symptoms?" (e.g., leg pain, rash, fever, numbness)     Denies     Pt went to "foot doctor" and was told it was pressure ulcer without any mediation. Pt is  diabetic. Pt is taking tylenol  for pain. Pt feels the pain is getting worse. The sore "looks like bruise- reddish brown." Not opened.  No known injury.  Protocols used: Foot Pain-A-AH

## 2023-09-19 ENCOUNTER — Encounter: Payer: Self-pay | Admitting: Physician Assistant

## 2023-09-19 ENCOUNTER — Ambulatory Visit (INDEPENDENT_AMBULATORY_CARE_PROVIDER_SITE_OTHER): Admitting: Physician Assistant

## 2023-09-19 VITALS — BP 151/64 | HR 60 | Ht 66.0 in | Wt 183.6 lb

## 2023-09-19 DIAGNOSIS — R238 Other skin changes: Secondary | ICD-10-CM | POA: Diagnosis not present

## 2023-09-19 MED ORDER — MUPIROCIN 2 % EX OINT: 1.0000 | TOPICAL_OINTMENT | Freq: Every day | CUTANEOUS | 0 refills | Status: DC | PRN

## 2023-09-19 NOTE — Progress Notes (Signed)
 Established patient visit   Patient: [name]   DOB: 1933/02/07   89 y.o. Female  MRN: [id number] Visit Date: 09/19/2023  Today's healthcare provider: Trenton Frock, PA-C   Cc. Right heel pain  Subjective     Pt reports a painful discolored spot on her right foot x a few days. She is concerned it is infected as she is a diabetes. Mostly painful when lying down, and she puts pressure on her right heel.  Medications: Outpatient Medications Prior to Visit  Medication Sig   ACCU-CHEK GUIDE test strip CHECK GLUCOSE IN THE MORNING AT  NOON AND AT BEDTIME   Accu-Chek Softclix Lancets lancets CHECK GLUCOSE IN THE MORNING AT  NOON AND AT BEDTIME   acetaminophen  (TYLENOL ) 500 MG tablet Take 500 mg by mouth every 6 (six) hours as needed for headache (pain).   albuterol  (VENTOLIN  HFA) 108 (90 Base) MCG/ACT inhaler Inhale 2 puffs into the lungs every 6 (six) hours as needed for wheezing or shortness of breath.   allopurinol  (ZYLOPRIM ) 100 MG tablet Take 1 tablet (100 mg total) by mouth at bedtime.   amLODipine  (NORVASC ) 5 MG tablet TAKE 1 TABLET BY MOUTH TWICE  DAILY   atorvastatin  (LIPITOR) 20 MG tablet TAKE 1 TABLET BY MOUTH ONCE  DAILY (Patient taking differently: Take 20 mg by mouth at bedtime.)   azelastine (OPTIVAR) 0.05 % ophthalmic solution Place 1 drop into both eyes 2 (two) times daily.   carvedilol  (COREG ) 12.5 MG tablet TAKE 1 TABLET BY MOUTH TWICE  DAILY   clopidogrel  (PLAVIX ) 75 MG tablet TAKE 1 TABLET BY MOUTH DAILY (Patient taking differently: Take 75 mg by mouth at bedtime.)   clotrimazole -betamethasone  (LOTRISONE ) cream Apply 1 Application topically daily. (Patient taking differently: Apply 1 Application topically daily as needed (rash, skin irritation).)   ENTRESTO  97-103 MG TAKE 1 TABLET BY MOUTH TWICE  DAILY   furosemide  (LASIX ) 40 MG tablet Take 1 tablet by mouth once daily   methocarbamol  (ROBAXIN ) 500 MG tablet Take 1 tablet (500 mg total) by mouth every 6  (six) hours as needed for muscle spasms.   Multiple Vitamins-Minerals (ONE A DAY WOMEN 50 PLUS) TABS Take 1 tablet by mouth daily.   nitrofurantoin , macrocrystal-monohydrate, (MACROBID ) 100 MG capsule Take 1 capsule (100 mg total) by mouth 2 (two) times daily.   nitroGLYCERIN  (NITROSTAT ) 0.4 MG SL tablet DISSOLVE ONE TABLET UNDER THE TONGUE EVERY 5 MINUTES AS NEEDED FOR CHEST PAIN.  DO NOT EXCEED A TOTAL OF 3 DOSES IN 15 MINUTES   nystatin  powder Apply 1 Application topically 3 (three) times daily. (Patient taking differently: Apply 1 Application topically 3 (three) times daily as needed (rash, skin irritation).)   pantoprazole  (PROTONIX ) 40 MG tablet Take 1 tablet (40 mg total) by mouth daily.   potassium chloride  (KLOR-CON ) 10 MEQ tablet Take 1 tablet (10 mEq total) by mouth daily.   No facility-administered medications prior to visit.    Review of Systems  Constitutional:  Negative for fatigue and fever.  Respiratory:  Negative for cough and shortness of breath.   Cardiovascular:  Negative for chest pain and leg swelling.  Gastrointestinal:  Negative for abdominal pain.  Skin:  Positive for color change.  Neurological:  Negative for dizziness and headaches.       Objective    BP (!) 151/64   Pulse 60   Ht 5\' 6"  (1.676 m)   Wt 183 lb 9.6 oz (83.3 kg)   BMI 29.63  kg/m    Physical Exam Vitals reviewed.  Constitutional:      Appearance: She is not ill-appearing.  HENT:     Head: Normocephalic.  Eyes:     Conjunctiva/sclera: Conjunctivae normal.  Cardiovascular:     Rate and Rhythm: Normal rate.  Pulmonary:     Effort: Pulmonary effort is normal. No respiratory distress.  Skin:    Comments: Slight redness w/ dry flaky skin to right heel.  Neurological:     Mental Status: She is alert and oriented to person, place, and time.  Psychiatric:        Mood and Affect: Mood normal.        Behavior: Behavior normal.      No results found for any visits on 09/19/23.   Assessment & Plan    Skin irritation -     Mupirocin; Apply 1 Application topically daily as needed.  Dispense: 22 g; Refill: 0    Recommending padding in shoes, putting a pillow under calf when lying down.  Rx prn mupiroicin when red/irritated. No sign of infection today.  Return if symptoms worsen or fail to improve.       Trenton Frock, PA-C  [facility] [phone] (phone) [phone] (fax)  [facility]

## 2023-09-26 ENCOUNTER — Encounter: Payer: Self-pay | Admitting: Licensed Clinical Social Worker

## 2023-09-26 ENCOUNTER — Other Ambulatory Visit: Payer: Self-pay | Admitting: Licensed Clinical Social Worker

## 2023-10-10 ENCOUNTER — Other Ambulatory Visit: Payer: Self-pay | Admitting: Licensed Clinical Social Worker

## 2023-10-10 NOTE — Patient Outreach (Signed)
 Complex Care Management   Visit Note  10/10/2023  Name:  [name] MRN: [id number] DOB: [birth year]  Situation: Referral received for Complex Care Management related to HHA in the home I obtained verbal consent from Patient.  Visit completed with patient  on the phone  Background:   Past Medical History:  Diagnosis Date   Allergic rhinitis    Anemia    Anxiety    Barrett esophagus    CAD (coronary artery disease) 2009   a. Multivessel s/p PCI w/DES 2009 // b. s/p CABG 2011  //  c. LHC 8/15: pLAD 95 ISR, LCx 100, pOM1 40, dRCA 100, S-OM1/OM2 ok, S-D1 ok, S-PDA ok, L-LAD ok, EF 60%   Carotid artery disease (HCC)    a. Carotid US  9/15: RICA 1-39%; LICA 40-59% >> FU 1 year  //  b. Carotid US  9/17: R 1-39%, L 40-59% >> FU 1 year   Chronic diastolic heart failure (HCC)    CKD (chronic kidney disease), stage II    GFR 60-89 ml/min   Depression    Disc disease, degenerative, cervical    Diverticulosis    Gastroparesis    GERD (gastroesophageal reflux disease)    Gout    H/O hiatal hernia    Helicobacter pylori gastritis    History of echocardiogram    a. Echo 11/13: EF 55% to 60%. Grade 2 diastolic dysfunction, MAC, trivial MR, mild LAE, normal RVSF, mild RAE, PASP 39 mmHg  //  b. 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   Type II or unspecified type diabetes mellitus without mention of complication, not stated as uncontrolled     Assessment: PCP sent the referral to a HHA agency, Patient is still waiting to hear from someone. SW sent an in-box  message to PCP to see who the referral was sent to   SDOH Interventions    Flowsheet Row Patient Outreach from 07/31/2023 in [place] POPULATION HEALTH DEPARTMENT Patient Outreach from 07/28/2023 in [place] POPULATION HEALTH DEPARTMENT Care Coordination from 06/26/2023 in [facility] Care Coordination Care Coordination from 06/08/2023 in [facility] Care Coordination Care Coordination from 05/23/2023 in [facility] Care Coordination Care Coordination from 05/04/2023 in [facility] Care Coordination  SDOH Interventions        Food Insecurity Interventions Intervention Not Indicated Intervention Not Indicated Intervention Not Indicated Intervention Not Indicated -- Intervention Not Indicated  Housing Interventions Intervention Not Indicated Intervention Not Indicated Intervention Not Indicated Intervention Not Indicated Intervention Not Indicated Intervention Not Indicated  Transportation Interventions Intervention Not Indicated Intervention Not Indicated Intervention Not Indicated Intervention Not Indicated Intervention Not Indicated Intervention Not Indicated  Utilities Interventions Intervention Not Indicated Intervention Not Indicated Intervention Not Indicated Intervention Not Indicated Intervention Not Indicated Intervention Not Indicated      Recommendation:   none  Follow Up Plan:   Telephone follow up appointment date/time:  10/30/2023 at 10:30 am Tobias CHARM Moose,  BSW, PhD [facility]  [facility], [facility] Social Worker Direct Dial: [phone]  Fax: [phone]

## 2023-10-10 NOTE — Patient Instructions (Signed)
 Visit Information  Thank you for taking time to visit with me today. Please don't hesitate to contact me if I can be of assistance to you before our next scheduled appointment.  Your next care management appointment is by telephone on 10/30/2023 at 10:30 am    Please call the care guide team at [phone] if you need to cancel, schedule, or reschedule an appointment.   Please call the Suicide and Crisis Lifeline: 988 go to [facility] Urgent [facility] [address], [place] [phone]) call 911 if you are experiencing a Mental Health or Behavioral Health Crisis or need someone to talk to.  Tobias CHARM [name], PhD [facility], [facility] Social Worker Direct Dial: [phone]  Fax: [phone]

## 2023-10-27 ENCOUNTER — Other Ambulatory Visit: Payer: Self-pay | Admitting: Cardiovascular Disease

## 2023-10-30 ENCOUNTER — Encounter: Payer: Self-pay | Admitting: Licensed Clinical Social Worker

## 2023-10-30 ENCOUNTER — Other Ambulatory Visit: Payer: Self-pay | Admitting: Licensed Clinical Social Worker

## 2023-10-31 NOTE — Patient Instructions (Signed)
 Visit Information  Thank you for taking time to visit with me today. Please don't hesitate to contact me if I can be of assistance to you before our next scheduled appointment.  Your next care management appointment is by telephone on 11/13/2023 at 10:30 am   Please call the care guide team at [phone] if you need to cancel, schedule, or reschedule an appointment.   Please call the Suicide and Crisis Lifeline: 988 go to [facility] Urgent [facility] [address] [place] [phone]) call 911 if you are experiencing a Mental Health or Behavioral Health Crisis or need someone to talk to.  Samantha CHARM [name], PhD [facility], [facility] Social Worker Direct Dial: [phone]  Fax: [phone]

## 2023-10-31 NOTE — Patient Outreach (Signed)
 Complex Care Management   Visit Note  10/31/2023  Name:  [name] MRN: [id number] DOB: Sep 16, 1932  Situation: Referral received for Complex Care Management related to SDOH Barriers:  HHA  I obtained verbal consent from Patient.  Visit completed with patient  on the phone  Background:   Past Medical History:  Diagnosis Date   Allergic rhinitis    Anemia    Anxiety    Barrett esophagus    CAD (coronary artery disease) 2009   a. Multivessel s/p PCI w/DES 2009 // b. s/p CABG 2011  //  c. LHC 8/15: pLAD 95 ISR, LCx 100, pOM1 40, dRCA 100, S-OM1/OM2 ok, S-D1 ok, S-PDA ok, L-LAD ok, EF 60%   Carotid artery disease (HCC)    a. Carotid US  9/15: RICA 1-39%; LICA 40-59% >> FU 1 year  //  b. Carotid US  9/17: R 1-39%, L 40-59% >> FU 1 year   Chronic diastolic heart failure (HCC)    CKD (chronic kidney disease), stage II    GFR 60-89 ml/min   Depression    Disc disease, degenerative, cervical    Diverticulosis    Gastroparesis    GERD (gastroesophageal reflux disease)    Gout    H/O hiatal hernia    Helicobacter pylori gastritis    History of echocardiogram    a. Echo 11/13: EF 55% to 60%. Grade 2 diastolic dysfunction, MAC, trivial MR, mild LAE, normal RVSF, mild RAE, PASP 39 mmHg  //  b. 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   Type II or unspecified type diabetes mellitus without mention of complication, not stated as uncontrolled     Assessment: patient stated that she did receive a call from a HHA to come do an assessment , she missed a call  form them and hopes to get a call back, she did speak with her PCP regarding the order.    SDOH Interventions    Flowsheet Row Patient Outreach from 10/30/2023 in [place] POPULATION HEALTH DEPARTMENT Patient Outreach from 07/31/2023 in [place] POPULATION HEALTH DEPARTMENT Patient Outreach from 07/28/2023 in [place] POPULATION HEALTH DEPARTMENT Care Coordination from 06/26/2023 in [facility] Care Coordination Care Coordination from 06/08/2023 in [facility] Care Coordination Care Coordination from 05/23/2023 in [facility] Coord[facility]  SDOH Interventions        Food Insecurity Interventions Intervention Not Indicated Intervention Not Indicated Intervention Not Indicated Intervention Not Indicated Intervention Not Indicated --  Housing Interventions Intervention Not Indicated Intervention Not Indicated Intervention Not Indicated Intervention Not Indicated Intervention Not Indicated Intervention Not Indicated  Transportation Interventions Intervention Not Indicated Intervention Not Indicated Intervention Not Indicated Intervention Not Indicated Intervention Not Indicated Intervention Not Indicated  Utilities Interventions Intervention Not Indicated Intervention Not Indicated Intervention Not Indicated Intervention Not Indicated Intervention Not Indicated Intervention Not Indicated  Financial Strain Interventions Intervention Not Indicated -- -- -- -- --    Recommendation:   none  Follow Up Plan:   Telephone follow up appointment date/time:  11/13/2023 at 10:30 am  Tobias CHARM [name], PhD [facility], [facility] Social Worker Direct Dial: [phone]  Fax: [phone]

## 2023-11-03 ENCOUNTER — Encounter: Admitting: *Deleted

## 2023-11-03 ENCOUNTER — Ambulatory Visit: Attending: Emergency Medicine | Admitting: Emergency Medicine

## 2023-11-03 ENCOUNTER — Ambulatory Visit
Admission: RE | Admit: 2023-11-03 | Discharge: 2023-11-03 | Disposition: A | Discharge status: Home / Self Care | Source: Ambulatory Visit | Attending: Family Medicine | Admitting: Family Medicine

## 2023-11-03 ENCOUNTER — Ambulatory Visit: Payer: Self-pay | Admitting: *Deleted

## 2023-11-03 ENCOUNTER — Telehealth: Payer: Self-pay | Admitting: *Deleted

## 2023-11-03 ENCOUNTER — Encounter: Payer: Self-pay | Admitting: Emergency Medicine

## 2023-11-03 VITALS — BP 123/71 | HR 60 | Temp 97.4°F | Resp 16

## 2023-11-03 VITALS — BP 106/48 | HR 60 | Ht 67.0 in | Wt 175.0 lb

## 2023-11-03 DIAGNOSIS — Z950 Presence of cardiac pacemaker: Secondary | ICD-10-CM | POA: Diagnosis not present

## 2023-11-03 DIAGNOSIS — L03314 Cellulitis of groin: Secondary | ICD-10-CM

## 2023-11-03 DIAGNOSIS — N184 Chronic kidney disease, stage 4 (severe): Secondary | ICD-10-CM | POA: Diagnosis not present

## 2023-11-03 DIAGNOSIS — I10 Essential (primary) hypertension: Secondary | ICD-10-CM | POA: Diagnosis not present

## 2023-11-03 DIAGNOSIS — I2510 Atherosclerotic heart disease of native coronary artery without angina pectoris: Secondary | ICD-10-CM | POA: Diagnosis not present

## 2023-11-03 DIAGNOSIS — I5032 Chronic diastolic (congestive) heart failure: Secondary | ICD-10-CM | POA: Diagnosis not present

## 2023-11-03 DIAGNOSIS — E785 Hyperlipidemia, unspecified: Secondary | ICD-10-CM | POA: Diagnosis not present

## 2023-11-03 DIAGNOSIS — B356 Tinea cruris: Secondary | ICD-10-CM | POA: Diagnosis not present

## 2023-11-03 LAB — CBC

## 2023-11-03 MED ORDER — KETOCONAZOLE 2 % EX CREA: 1.0000 | TOPICAL_CREAM | Freq: Every day | CUTANEOUS | 0 refills | Status: DC

## 2023-11-03 MED ORDER — DOXYCYCLINE HYCLATE 100 MG PO CAPS: 100.0000 mg | ORAL_CAPSULE | Freq: Two times a day (BID) | ORAL | 0 refills | Status: AC

## 2023-11-03 MED ORDER — AMLODIPINE BESYLATE 5 MG PO TABS
5.0000 mg | ORAL_TABLET | Freq: Every day | ORAL | 3 refills | Status: AC
Start: 2023-11-03 — End: 2024-02-13

## 2023-11-03 NOTE — Telephone Encounter (Signed)
 FYI Only or Action Required?: FYI only for provider.  Patient was last seen in primary care on 09/05/2023 by [name], Samantha SAUNDERS, DO.  Called Nurse Triage reporting Vaginal Discharge.  Symptoms began several days ago.  Interventions attempted: OTC medications: cream and powder.  Symptoms are: gradually worsening.  Triage Disposition: See Physician Within 24 Hours  Patient/caregiver understands and will follow disposition?: No open appointment- UC advised and scheduled  Reason for Disposition  Genital area rash  Answer Assessment - Initial Assessment Questions 1. APPEARANCE of RASH: What does the rash look like? (e.g., blisters, dry flaky skin, red spots, redness, sores)     Red and swollen in some areas- left side is worse 2. LOCATION: Where is the rash located?      Groin area 3. NUMBER: How many spots are there?      2 areas on concern 4. SIZE: How big are the spots? (e.g., inches, cm; or compare to size of pinhead, tip of pen, eraser, pea)      Large area- crease and thigh 5. ONSET: When did the rash start?      Started 3-4 days ago 6. ITCHING: Does the rash itch? If Yes, ask: How bad is the itch?  (Scale 0-10; or none, mild, moderate, severe)     Both- severe 7. PAIN: Does the rash hurt? If Yes, ask: How bad is the pain?  (Scale 0-10; or none, mild, moderate, severe)     Both- severe 8. OTHER SYMPTOMS: Do you have any other symptoms? (e.g., fever)     Using cream and powder- not helping  Protocols used: Rash or Redness - Localized-A-AH   Copied from CRM #[id number]. Topic: Clinical - Red Word Triage >> Nov 03, 2023  9:10 AM [name] wrote: [facility] that prompted transfer to Nurse Triage: vaginal discharge with pain

## 2023-11-03 NOTE — ED Triage Notes (Signed)
 Patient reports having a rash/ and yeast to groin area (both). The patient states the left side is worse than the right.   Home interventions: cream and powder (does not remember the name)

## 2023-11-03 NOTE — Telephone Encounter (Signed)
 Pt was scheduled for AWV today. No answer and voicemail full. Chart review indicates pt is at Urgent Care now being seen. Will call pt when I return on 11/13/23 to r/s appt.

## 2023-11-03 NOTE — ED Provider Notes (Addendum)
 [facility]    CSN: [id number] Arrival date & time: 11/03/23  1435      History   Chief Complaint Chief Complaint  Patient presents with   Rash    Entered by patient    HPI [name] is a 89 y.o. female with a past medical history of CAD, CHF, CKD, depression, GERD, gout, hypertension, diabetes who presents for rash.  Patient reports a rash to bilateral groin areas left greater than right for about a week.  States she has had this before and has been applying nystatin  powder without improvement.  She also thinks there might be an infection to the left side.  She denies any fevers or chills.  No other concerns at this time.  Rash   Past Medical History:  Diagnosis Date   Allergic rhinitis    Anemia    Anxiety    Barrett esophagus    CAD (coronary artery disease) 2009   a. Multivessel s/p PCI w/DES 2009 // b. s/p CABG 2011  //  c. LHC 8/15: pLAD 95 ISR, LCx 100, pOM1 40, dRCA 100, S-OM1/OM2 ok, S-D1 ok, S-PDA ok, L-LAD ok, EF 60%   Carotid artery disease (HCC)    a. Carotid US  9/15: RICA 1-39%; LICA 40-59% >> FU 1 year  //  b. Carotid US  9/17: R 1-39%, L 40-59% >> FU 1 year   Chronic diastolic heart failure (HCC)    CKD (chronic kidney disease), stage II    GFR 60-89 ml/min   Depression    Disc disease, degenerative, cervical    Diverticulosis    Gastroparesis    GERD (gastroesophageal reflux disease)    Gout    H/O hiatal hernia    Helicobacter pylori gastritis    History of echocardiogram    a. Echo 11/13: EF 55% to 60%. Grade 2 diastolic dysfunction, MAC, trivial MR, mild LAE, normal RVSF, mild RAE, PASP 39 mmHg  //  b. Echo 4/17: EF 55-60%, normal wall motion, trivial AI, MAC, moderate LAE, mild RVE, PASP 35 mmHg   History of thrombocytopenia    HTN (hypertension)    Hyperlipidemia    Hypothyroidism    LBP (low back pain)    Lumbar disc disease/lumbar spinal stenosis   Macular degeneration    Morbid obesity (HCC)    Myocardial infarction (HCC)     Osteoarthritis    Osteopenia    PVD (peripheral vascular disease) (HCC)    Sick sinus syndrome (HCC)    MDT Dual-chamber PPM implant 02/2012   Type II or unspecified type diabetes mellitus without mention of complication, not stated as uncontrolled     Patient Active Problem List   Diagnosis Date Noted   Non-pressure chronic ulcer of other part of left foot with unspecified severity (HCC) 07/01/2023   Parkinsonism, unspecified Parkinsonism type (HCC) 07/01/2023   Tinea corporis 07/01/2023   Dermatitis associated with moisture 03/03/2023   Open wound of skin 03/03/2023   Acute pain of left hip 09/01/2022   Need for pneumococcal 20-valent conjugate vaccination 08/22/2022   Rash 05/27/2022   Tremor 05/27/2022   Coronary artery disease involving native coronary artery of native heart without angina pectoris 09/14/2021   Pain due to onychomycosis of toenails of both feet 01/15/2021   Type 2 diabetes mellitus with diabetic dermatitis, without long-term current use of insulin  (HCC) 01/15/2021   Palpitations 01/15/2018   Cardiac pacemaker in situ 01/15/2018   Heel spur, left 09/13/2017   Left knee pain  08/23/2017   CKD (chronic kidney disease), stage III (HCC) 12/04/2016   Chest pain 11/23/2015   Type I (juvenile type) diabetes mellitus with renal manifestations, not stated as uncontrolled(250.41) 04/27/2012   Insulin  dependent diabetes mellitus with complications 04/27/2012   AKI (acute kidney injury) (HCC) 03/05/2012   Diarrhea 03/05/2012   Carotid artery disease (HCC) 08/11/2010   Left shoulder pain 07/08/2010   Preventative health care 07/08/2010   HOARSENESS 06/04/2010   Pruritus 05/11/2010   ANEMIA-NOS 04/02/2010   (HFpEF) heart failure with preserved ejection fraction (HCC) 02/11/2010   SINUS BRADYCARDIA 10/30/2009   Allergic rhinitis 09/11/2009   Backache 09/11/2009   MUSCLE STRAIN, RIGHT BUTTOCK 09/11/2009   Herpes zoster 05/11/2009   SHOULDER PAIN, LEFT 12/29/2008    Proteinuria 12/12/2008   Abdominal pain 09/24/2007   Hx of CABG 05/03/2007   CHEST PAIN 04/20/2007   Dizziness 02/28/2007   Anxiety state 02/15/2007   GERD 02/15/2007   Gastroparesis 02/15/2007   DISC DISEASE, CERVICAL 02/15/2007   DISC DISEASE, LUMBAR 02/15/2007   SPINAL STENOSIS, LUMBAR 02/15/2007   PERIPHERAL EDEMA 02/15/2007   Personal History of Other Diseases of Digestive Disease 02/15/2007   Hyperlipidemia LDL goal <70 01/01/2007   GOUT 01/01/2007   Morbid obesity (HCC) 01/01/2007   DEPRESSION 01/01/2007   PERIPHERAL VASCULAR DISEASE 01/01/2007   Diverticulosis of colon 01/01/2007   Osteoarthritis 01/01/2007   LOW BACK PAIN 01/01/2007   Disorder of bone and cartilage 01/01/2007   Essential hypertension 10/26/2006    Past Surgical History:  Procedure Laterality Date   ABDOMINAL HYSTERECTOMY     CARDIAC CATHETERIZATION     2011  DR COOPER (APPT NEXT WEEK)   CHOLECYSTECTOMY     CORONARY ARTERY BYPASS GRAFT  2011   LIMA-LAD, SVG-DIAG, SVG-OM1-OM2, SVG-PDA   CORONARY STENT PLACEMENT     Drug-eluting stent to the left anterior descending, circumflex and right coronary artery in Jan 2009   EYE SURGERY     BIL CATARACT REMOVAL 06/2010   LEFT HEART CATHETERIZATION WITH CORONARY ANGIOGRAM N/A 12/02/2013   Procedure: LEFT HEART CATHETERIZATION WITH CORONARY ANGIOGRAM;  Surgeon: Victory LELON Claudene DOUGLAS, MD;  Location: [facility] CATH LAB;  Service: Cardiovascular;  Laterality: N/A;   OVARIAN CYST REMOVAL     PACEMAKER INSERTION  03/06/12   MDT Adapta L implanted by Dr Kelsie for SSS   PERMANENT PACEMAKER INSERTION N/A 03/06/2012   Procedure: PERMANENT PACEMAKER INSERTION;  Surgeon: Lynwood Kelsie, MD;  Location: [facility] CATH LAB;  Service: Cardiovascular;  Laterality: N/A;   SHOULDER ARTHROSCOPY  06/16/2011   Procedure: ARTHROSCOPY SHOULDER;  Surgeon: Rome JULIANNA Pepper, MD;  Location: [facility] OR;  Service: Orthopedics;  Laterality: Left;  LEFT SHOULDER ARTHROSCOPY ACROMIALPLASTY, POSSIBLE MINI OPEN CUFF  REPAIR    TUBAL LIGATION      OB History   No obstetric history on file.      Home Medications    Prior to Admission medications   Medication Sig Start Date End Date Taking? Authorizing Provider  doxycycline  (VIBRAMYCIN ) 100 MG capsule Take 1 capsule (100 mg total) by mouth 2 (two) times daily for 7 days. 11/03/23 11/10/23 Yes Keldan Eplin, Jodi R, NP  ketoconazole (NIZORAL) 2 % cream Apply 1 Application topically daily. 11/03/23  Yes Teoman Giraud, Jodi R, NP  ACCU-CHEK GUIDE test strip CHECK GLUCOSE IN THE MORNING AT  [facility] AND AT BEDTIME 11/09/22   Antonio Meth, Yvonne R, DO  Accu-Chek Softclix Lancets lancets CHECK GLUCOSE IN THE MORNING AT  [facility] AND AT BEDTIME 11/09/22  Antonio Meth, Yvonne R, DO  acetaminophen  (TYLENOL ) 500 MG tablet Take 500 mg by mouth every 6 (six) hours as needed for headache (pain).    [provider]  albuterol  (VENTOLIN  HFA) 108 (90 Base) MCG/ACT inhaler Inhale 2 puffs into the lungs every 6 (six) hours as needed for wheezing or shortness of breath. 07/11/23   Cyndi Shaver, PA-C  allopurinol  (ZYLOPRIM ) 100 MG tablet Take 1 tablet (100 mg total) by mouth at bedtime. 04/17/23   Antonio Meth Jamee JONELLE, DO  amLODipine  (NORVASC ) 5 MG tablet Take 1 tablet (5 mg total) by mouth at bedtime. 11/03/23 02/11/24  Rana Lum CROME, NP  atorvastatin  (LIPITOR) 20 MG tablet TAKE 1 TABLET BY MOUTH ONCE  DAILY 06/05/23   Cooper, Michael, MD  azelastine (OPTIVAR) 0.05 % ophthalmic solution Place 1 drop into both eyes 2 (two) times daily. 10/26/15   [provider]  carvedilol  (COREG ) 12.5 MG tablet TAKE 1 TABLET BY MOUTH TWICE  DAILY 10/30/23   Cooper, Michael, MD  clopidogrel  (PLAVIX ) 75 MG tablet TAKE 1 TABLET BY MOUTH DAILY 08/29/23   Wonda Sharper, MD  ENTRESTO  97-103 MG TAKE 1 TABLET BY MOUTH TWICE  DAILY 06/05/23   Cooper, Michael, MD  furosemide  (LASIX ) 40 MG tablet Take 1 tablet by mouth once daily 06/28/23   Wonda Sharper, MD  methocarbamol  (ROBAXIN ) 500 MG tablet Take 1  tablet (500 mg total) by mouth every 6 (six) hours as needed for muscle spasms. 06/29/23   Antonio Meth Jamee JONELLE, DO  Multiple Vitamins-Minerals (ONE A DAY WOMEN 50 PLUS) TABS Take 1 tablet by mouth daily.    [provider]  mupirocin  ointment (BACTROBAN ) 2 % Apply 1 Application topically daily as needed. 09/19/23   Drubel, Shaver, PA-C  nitrofurantoin , macrocrystal-monohydrate, (MACROBID ) 100 MG capsule Take 1 capsule (100 mg total) by mouth 2 (two) times daily. 09/10/23   Antonio Meth Jamee R, DO  nitroGLYCERIN  (NITROSTAT ) 0.4 MG SL tablet DISSOLVE ONE TABLET UNDER THE TONGUE EVERY 5 MINUTES AS NEEDED FOR CHEST PAIN.  DO NOT EXCEED A TOTAL OF 3 DOSES IN 15 MINUTES 02/27/20   Kathrine Gauze K, NP  nystatin  powder Apply 1 Application topically 3 (three) times daily. 06/29/23   Antonio Meth Jamee JONELLE, DO  pantoprazole  (PROTONIX ) 40 MG tablet Take 1 tablet (40 mg total) by mouth daily. 07/28/23   Antonio Meth Jamee JONELLE, DO  potassium chloride  (KLOR-CON ) 10 MEQ tablet Take 1 tablet (10 mEq total) by mouth daily. 07/12/22   Lelon Glendia DASEN, PA-C    Family History Family History  Problem Relation Age of Onset   Diabetes Mother    Hypertension Mother    Heart attack Mother    Stroke Father    Cancer Brother    Deafness Daughter    Other Son        cerebral edema at 18 months   Coronary artery disease Other     Social History Social History   Tobacco Use   Smoking status: Former    Current packs/day: 0.00    Average packs/day: 0.3 packs/day for 28.0 years (7.0 ttl pk-yrs)    Types: Cigarettes    Start date: 1966    Quit date: 37    Years since quitting: 31.5   Smokeless tobacco: Never   Tobacco comments:    quit 30 yrs ago  Vaping Use   Vaping status: Never Used  Substance Use Topics   Alcohol use: No   Drug use: No  Allergies   Codeine, Hydrocodone, Penicillins, Shellfish allergy, Sulfonamide derivatives, Cipro  [ciprofloxacin  hcl], Morphine  and codeine, Tiazac  [diltiazem], Glucophage  [metformin ], and Mevacor [lovastatin]   Review of Systems Review of Systems  Skin:  Positive for rash.     Physical Exam Triage Vital Signs ED Triage Vitals  Encounter Vitals Group     BP 11/03/23 1500 123/71     Girls Systolic BP Percentile --      Girls Diastolic BP Percentile --      Boys Systolic BP Percentile --      Boys Diastolic BP Percentile --      Pulse Rate 11/03/23 1500 60     Resp 11/03/23 1500 16     Temp 11/03/23 1500 (!) 97.4 F (36.3 C)     Temp Source 11/03/23 1500 Oral     SpO2 11/03/23 1500 95 %     Weight --      Height --      Head Circumference --      Peak Flow --      Pain Score 11/03/23 1459 10     Pain Loc --      Pain Education --      Exclude from Growth Chart --    No data found.  Updated Vital Signs BP 123/71 (BP Location: Left Arm)   Pulse 60   Temp (!) 97.4 F (36.3 C) (Oral)   Resp 16   SpO2 95%   Visual Acuity Right Eye Distance:   Left Eye Distance:   Bilateral Distance:    Right Eye Near:   Left Eye Near:    Bilateral Near:     Physical Exam Vitals and nursing note reviewed.  Constitutional:      General: She is not in acute distress.    Appearance: Normal appearance. She is not ill-appearing.     Comments: Patient uses walker to ambulate  HENT:     Head: Normocephalic and atraumatic.  Eyes:     Pupils: Pupils are equal, round, and reactive to light.  Cardiovascular:     Rate and Rhythm: Normal rate.  Pulmonary:     Effort: Pulmonary effort is normal.  Skin:    General: Skin is warm and dry.         Comments: There are erythematous plaques to the right left groin with raised edges and central clearing.  There is some mild and breakdown to the area without drainage.  There is minimal erythematous plaques to the right groin without any drainage, swelling and skin is intact.  Neurological:     General: No focal deficit present.     Mental Status: She is alert and oriented to person,  place, and time.  Psychiatric:        Mood and Affect: Mood normal.        Behavior: Behavior normal.      UC Treatments / Results  Labs (all labs ordered are listed, but only abnormal results are displayed) Labs Reviewed - No data to display  Comprehensive metabolic panel with GFR Order: [id number]  Status: Final result     Next appt: 11/13/2023 at 10:30 AM in [facility])     Dx: Type 2 diabetes mellitus with diabeti...   Test Result Released: No (inaccessible in MyChart)   0 Result Notes          Component Ref Range & Units (hover) 1 mo ago (09/05/23) 2 mo ago (08/29/23) 2 mo ago (08/28/23)  2 mo ago (08/28/23) 6 mo ago (04/17/23) 7 mo ago (03/31/23) 7 mo ago (03/27/23)  Sodium 142 140 R 140 R 141 R 140 142 143  Potassium 4.3 3.8 R 3.9 R 3.9 R 4.4 4.2 4.7  Chloride 103 106 R 108 R 110 R 103 104 106  CO2 28 24 R  20 Low  R 27 30 29   Glucose, Bld 145 High  154 High  CM 169 High  CM 166 High  CM 195 High  141 High  135 High   BUN 43 High  29 High  R 40 High  R 34 High  R 42 High  42 High  39 High   Creatinine, Ser 1.58 High  1.45 High  R 1.70 High  R 1.51 High  R 1.37 High  1.58 High  1.56 High   Total Bilirubin 0.8    0.6 0.6 0.6  Alkaline Phosphatase 93    118 High  121 High  120 High   AST 17    18 22 18   ALT 14    21 19 16   Total Protein 6.5    6.6 6.2 6.4  Albumin 3.8    3.9 3.7 3.8  GFR 28.98 Low     34.48 Low  CM 29.07 Low  CM 29.52 Low  CM  Comment: Calculated using the CKD-EPI Creatinine Equation (2021)  Calcium  8.6 8.3 Low  R  7.9 Low  R 9.1 8.5 8.8  Resulting Agency [place] [facility] [place] [facility] [place] [facility] [place] [facility]        Specimen Collected: 09/05/23 12:33 Last Resulted: 09/05/23 16:22    EKG   Radiology No results found.  Procedures Procedures (including critical care time)  Medications Ordered in UC Medications - No data to display  Initial Impression / Assessment and Plan / UC  Course  I have reviewed the triage vital signs and the nursing notes.  Pertinent labs & imaging results that were available during my care of the patient were reviewed by me and considered in my medical decision making (see chart for details).     Reviewed exam and symptoms with patient no red flags.  Will start ketoconazole topically to the groin area daily.  Advised to keep area clean and dry and use a barrier to help protect the skin.  Concern for secondary infection given her diabetes we will start doxycycline  twice daily for 7 days.  Advised PCP follow-up first thing Monday morning for recheck.  ER precautions reviewed and patient verbalized understanding Final Clinical Impressions(s) / UC Diagnoses   Final diagnoses:  Tinea cruris  Cellulitis of groin     Discharge Instructions      Stop the nystatin  powder and start ketoconazole topical antifungal cream daily to your groin area.  Try to keep the areas clean and dry as possible and use a barrier to help protect your skin. start doxycycline  twice daily for 7 days to help treat the underlying infection.  Please follow-up with your PCP first thing Monday for follow-up/recheck.  Please go to the ER for any worsening symptoms.  Hope you feel better soon!    ED Prescriptions     Medication Sig Dispense Auth. Provider   ketoconazole (NIZORAL) 2 % cream Apply 1 Application topically daily. 30 g Aracelli Woloszyn, Jodi R, NP   doxycycline  (VIBRAMYCIN ) 100 MG capsule Take 1 capsule (100 mg total) by mouth 2 (two) times daily for 7 days.  14 capsule Nickole Adamek, Jodi R, NP      PDMP not reviewed this encounter.   Loreda Myla SAUNDERS, NP 11/03/23 1522    Loreda Myla SAUNDERS, NP 11/03/23 [phone]

## 2023-11-03 NOTE — Discharge Instructions (Addendum)
 Stop the nystatin  powder and start ketoconazole topical antifungal cream daily to your groin area.  Try to keep the areas clean and dry as possible and use a barrier to help protect your skin. start doxycycline  twice daily for 7 days to help treat the underlying infection.  Please follow-up with your PCP first thing Monday for follow-up/recheck.  Please go to the ER for any worsening symptoms.  Hope you feel better soon!

## 2023-11-03 NOTE — Progress Notes (Addendum)
 Cardiology Office Note:    Date:  11/09/2023  ID:  [name], DOB 07-19-1932, MRN [id number] PCP: Antonio Meth, [name], DO  [facility] Providers Cardiologist:  Ozell Fell, MD Cardiology APP:  Lelon Glendia DASEN, PA-C       Patient Profile:       Chief Complaint: 1 year follow-up History of Present Illness:  [name] is a 89 y.o. female with visit-pertinent history of coronary artery disease s/p CABG in 2011, sick sinus syndrome s/p PPM, heart failure with preserved ejection fraction, hypertension, hyperlipidemia, diabetes, CKD, peripheral arterial disease, GERD/Barrett's esophagus, carotid artery disease, hypothyroidism  She underwent CABG in 2011 with SVG-OM1/OM 2, SVG-D1, SVG-PDA, LIMA-LAD.  She underwent cardiac catheterization on 11/2013 showing all grafts were patent with a EF of 60%.  TTE 07/2015 with LVEF 55 to 60%, normal wall motion, trivial AI, MAC, moderate LAE, PASP 35.  Myoview  11/2015 showed EF 60% with no ischemia or infarct and low risk.  TTE 01/2022 showed LVEF 55 to 60%, no RWMA, grade 1 DD, mildly reduced RV SF, normal PASP, trivial MR, trivial AI, AV sclerosis without stenosis. She has a history of HFpEF but intolerant of SGLT2 inhibitor due to yeast infection and intolerant of spironolactone  due to side effect of diarrhea.   She was last seen in office on 10/31/2022 by Dr. Fell.  She was without cardiovascular concerns under medication management was continued without changes.  She was recently admitted to the hospital on 08/28/2023 for chest pain that was left-sided radiating down her left arm.  Troponin was 22, 24 with creatinine of 1.51 on arrival.  Echocardiogram 08/2023 showed LVEF 55 to 60%, no RWMA, mild LVH, grade 2 DD, RV function is low normal, RV mildly enlarged, mildly elevated PASP at 38.3, left atrial size moderately dilated, trivial MR, mild aortic valve regurgitation.  Her chest pain was mostly atypical and did not require further  workup.     Discussed the use of AI scribe software for clinical note transcription with the patient, who gave verbal consent to proceed.  History of Present Illness [name] is an 89 year old female with hypertension who presents for a 1 year follow-up with acute complaints of lightheadedness and leg swelling.  Today she is without anginal symptoms.  Lightheadedness has been present for the past couple of weeks, worsening over the last few days. It occurs throughout the day, and worse upon standing.  She has not been able to measure her blood pressure during these episodes.  She denies any falls, syncope, presyncope, palpitations.  No exertional chest pains.  There is swelling in her feet and legs, though it is not severe. She experiences shortness of breath when walking short distances, such as from her bedroom to the bathroom, but not while sitting or lying down.  Denies any PND.   Review of systems:  Please see the history of present illness. All other systems are reviewed and otherwise negative.      Studies Reviewed:    EKG Interpretation Date/Time:  Friday November 03 2023 10:18:33 EDT Ventricular Rate:  60 PR Interval:  196 QRS Duration:  188 QT Interval:  494 QTC Calculation: 494 R Axis:   -67  Text Interpretation: AV dual-paced rhythm When compared with ECG of 28-Aug-2023 15:40, PREVIOUS ECG IS PRESENT Confirmed by Rana Dixon [phone]) on 11/09/2023 6:44:53 PM    Echocardiogram 08/29/2023 1. Left ventricular ejection fraction, by estimation, is 55 to 60%. The  left  ventricle has normal function. The left ventricle has no regional  wall motion abnormalities. There is mild concentric left ventricular  hypertrophy. Left ventricular diastolic  parameters are consistent with Grade II diastolic dysfunction  (pseudonormalization). Elevated left atrial pressure.   2. Right ventricular systolic function is low normal. The right  ventricular size is mildly enlarged.  There is mildly elevated pulmonary  artery systolic pressure. The estimated right ventricular systolic  pressure is 38.3 mmHg.   3. Left atrial size was moderately dilated.   4. The mitral valve is abnormal. Trivial mitral valve regurgitation.   5. Tricuspid valve regurgitation is mild to moderate.   6. The aortic valve is tricuspid. Aortic valve regurgitation is mild.  Aortic valve sclerosis/calcification is present, without any evidence of  aortic stenosis.   7. The inferior vena cava is normal in size with greater than 50%  respiratory variability, suggesting right atrial pressure of 3 mmHg.   Echocardiogram 01/31/2022  1. Left ventricular ejection fraction, by estimation, is 55 to 60%. The  left ventricle has normal function. The left ventricle has no regional  wall motion abnormalities. Left ventricular diastolic parameters are  consistent with Grade I diastolic  dysfunction (impaired relaxation).   2. Right ventricular systolic function is mildly reduced. The right  ventricular size is mildly enlarged. There is normal pulmonary artery  systolic pressure. The estimated right ventricular systolic pressure is  26.2 mmHg.   3. The mitral valve is normal in structure. Trivial mitral valve  regurgitation.   4. The aortic valve is tricuspid. Aortic valve regurgitation is trivial.  Aortic valve sclerosis/calcification is present, without any evidence of  aortic stenosis.   5. The inferior vena cava is normal in size with greater than 50%  respiratory variability, suggesting right atrial pressure of 3 mmHg.  Risk Assessment/Calculations:             Physical Exam:   VS:  BP (!) 106/48 (BP Location: Right Arm, Patient Position: Sitting, Cuff Size: Normal)   Pulse 60   Ht 5' 7 (1.702 m)   Wt 175 lb (79.4 kg)   BMI 27.41 kg/m    Wt Readings from Last 3 Encounters:  11/03/23 175 lb (79.4 kg)  09/19/23 183 lb 9.6 oz (83.3 kg)  09/05/23 182 lb 9.6 oz (82.8 kg)    GEN: Well  nourished, well developed in no acute distress NECK: No JVD; No carotid bruits CARDIAC: RRR, no murmurs, rubs, gallops RESPIRATORY:  Clear to auscultation without rales, wheezing or rhonchi  ABDOMEN: Soft, non-tender, non-distended EXTREMITIES: 1+ bilateral lower extremity edema; No acute deformity      Assessment and Plan:  Coronary artery disease S/p CABG in 2011 with SVG-OM1/OM 2, SVG-D1, SVG-PDA, LIMA-LAD Cardiac catheterization 11/2013 showed all grafts were patent Myoview  11/2015 was low risk without ischemia/infarction Echocardiogram 08/2023 with LVEF 55-60%, no RWMA She reports her chest pains have resolved.  Denies any exertional symptoms - Today she is without anginal symptoms, no indication for further ischemic evaluation at this time - Continue atorvastatin  20 mg daily, Plavix  75 mg daily, nitroglycerin  as needed - BMET and CBC today  HFpEF Echocardiogram 01/2022 with LVEF 55-60% Echocardiogram 08/2023 LVEF 55-60%, no RWMA, mild LVH, grade 2 DD Unable to tolerate SGLT2i or MRA - NYHA class III -Today patient appears euvolemic.  No volume overload today.  Clear breath sounds on exam.  - She has chronic dyspnea on exertion that appears stable without orthopnea or PND.  Chronic lower extremity edema  at baseline that is stable, likely venous insufficiency. - Continue carvedilol  12.5 mg twice daily, Entresto  97-103 mg twice daily, furosemide  40 mg daily  Hypertension Blood pressure today is 106/48 She has not been taking her blood pressures at home She has been dealing with lightheadedness over the past month particularly with standing.  Given her BP today was low normal, I will decrease her amlodipine  from 5 mg twice daily to 5 mg once daily in the evening as it seems she has been dealing with some orthostatic hypotension - Continue amlodipine  5 mg daily, carvedilol  12.5 mg twice daily, Entresto  97-103 mg twice daily - Begin monitoring BP at home  Hyperlipidemia, LDL goal  <70 LDL 39 on 08/2023 and well-controlled - Continue atorvastatin  20 mg daily  Chronic kidney disease stave IV Creatinine 1.58 and GFR 28 on 08/2023 - Remains stable - Avoid nephrotoxic drugs  Cardiac pacemaker in situ - Management per EP      Dispo:  Return in about 6 months (around 05/05/2024).  Signed, Lum LITTIE Louis, NP

## 2023-11-03 NOTE — Patient Instructions (Addendum)
 Medication Instructions:  DECREASE YOUR AMLODIPINE  TO 5 MG ONCE DAILY AT BEDTIME.  Lab Work: Nutritional therapist AND CBC TO BE DONE TODAY.  Testing/Procedures: NONE  Follow-Up: At [facility], you and your health needs are our priority.  As part of our continuing mission to provide you with exceptional heart care, our providers are all part of one team.  This team includes your primary Cardiologist (physician) and Advanced Practice Providers or APPs (Physician Assistants and Nurse Practitioners) who all work together to provide you with the care you need, when you need it.  Your next appointment:   6 MONTHS  Provider:   Ozell Fell, MD

## 2023-11-04 LAB — CBC
Hematocrit: 35.4 % (ref 34.0–46.6)
Hemoglobin: 11.2 g/dL (ref 11.1–15.9)
MCH: 30.6 pg (ref 26.6–33.0)
MCHC: 31.6 g/dL (ref 31.5–35.7)
MCV: 97 fL (ref 79–97)
Platelets: 183 x10E3/uL (ref 150–450)
RBC: 3.66 x10E6/uL — ABNORMAL LOW (ref 3.77–5.28)
RDW: 11.8 % (ref 11.7–15.4)
WBC: 7.6 x10E3/uL (ref 3.4–10.8)

## 2023-11-04 LAB — BASIC METABOLIC PANEL WITH GFR
BUN/Creatinine Ratio: 23 (ref 12–28)
BUN: 48 mg/dL — ABNORMAL HIGH (ref 8–27)
CO2: 21 mmol/L (ref 20–29)
Calcium: 9 mg/dL (ref 8.7–10.3)
Chloride: 104 mmol/L (ref 96–106)
Creatinine, Ser: 2.13 mg/dL — ABNORMAL HIGH (ref 0.57–1.00)
Glucose: 165 mg/dL — ABNORMAL HIGH (ref 70–99)
Potassium: 4.8 mmol/L (ref 3.5–5.2)
Sodium: 142 mmol/L (ref 134–144)
eGFR: 22 mL/min/1.73 — ABNORMAL LOW (ref >59)

## 2023-11-06 ENCOUNTER — Ambulatory Visit: Payer: Self-pay | Admitting: Emergency Medicine

## 2023-11-06 DIAGNOSIS — I10 Essential (primary) hypertension: Secondary | ICD-10-CM

## 2023-11-08 DIAGNOSIS — H353111 Nonexudative age-related macular degeneration, right eye, early dry stage: Secondary | ICD-10-CM | POA: Diagnosis not present

## 2023-11-08 DIAGNOSIS — H35371 Puckering of macula, right eye: Secondary | ICD-10-CM | POA: Diagnosis not present

## 2023-11-08 DIAGNOSIS — H353221 Exudative age-related macular degeneration, left eye, with active choroidal neovascularization: Secondary | ICD-10-CM | POA: Diagnosis not present

## 2023-11-08 DIAGNOSIS — E113293 Type 2 diabetes mellitus with mild nonproliferative diabetic retinopathy without macular edema, bilateral: Secondary | ICD-10-CM | POA: Diagnosis not present

## 2023-11-13 ENCOUNTER — Other Ambulatory Visit: Payer: Self-pay | Admitting: Licensed Clinical Social Worker

## 2023-11-13 NOTE — Patient Outreach (Signed)
 Complex Care Management   Visit Note  11/13/2023  Name:  [name] MRN: [id number] DOB: [birth year]  Situation: Referral received for Complex Care Management related to [facility] services I obtained verbal consent from Patient.  Visit completed with patient  on the phone  Background:   Past Medical History:  Diagnosis Date   Allergic rhinitis    Anemia    Anxiety    Barrett esophagus    CAD (coronary artery disease) 2009   a. Multivessel s/p PCI w/DES 2009 // b. s/p CABG 2011  //  c. LHC 8/15: pLAD 95 ISR, LCx 100, pOM1 40, dRCA 100, S-OM1/OM2 ok, S-D1 ok, S-PDA ok, L-LAD ok, EF 60%   Carotid artery disease (HCC)    a. Carotid US  9/15: RICA 1-39%; LICA 40-59% >> FU 1 year  //  b. Carotid US  9/17: R 1-39%, L 40-59% >> FU 1 year   Chronic diastolic heart failure (HCC)    CKD (chronic kidney disease), stage II    GFR 60-89 ml/min   Depression    Disc disease, degenerative, cervical    Diverticulosis    Gastroparesis    GERD (gastroesophageal reflux disease)    Gout    H/O hiatal hernia    Helicobacter pylori gastritis    History of echocardiogram    a. Echo 11/13: EF 55% to 60%. Grade 2 diastolic dysfunction, MAC, trivial MR, mild LAE, normal RVSF, mild RAE, PASP 39 mmHg  //  b. 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   Type II or unspecified type diabetes mellitus without mention of complication, not stated as uncontrolled     Assessment:Patient stated that a [employer] company called to do an assessment and she could not remember the name, but stated  that she awaiting for a home visit to be done for an assessment. SW will close out the case and stated that if any other SW needs arise to contact the PCP to get on the SW schedule   SDOH Interventions    Flowsheet Row Patient Outreach [facility] 10/30/2023 [facility] Patient Outreach from 07/31/2023 in [place] POP[facility]ATION HEALTH DEPARTMENT Patient Outreach from 07/28/2023 in  POPULATION HEALTH DEPARTMENT Care Coordination from 06/26/2023 in [facility] Community Care Coordination Care Coordination from 06/08/2023 in [facility] Community Care Coordination Care Coordination from 05/23/2023 in [facility] Care Coordination  SDOH Interventions        Food Insecurity Interventions Intervention Not Indicated Intervention Not Indicated Intervention Not Indicated Intervention Not Indicated Intervention Not Indicated --  Housing Interventions Intervention Not Indicated Intervention Not Indicated Intervention Not Indicated Intervention Not Indicated Intervention Not Indicated Intervention Not Indicated  Transportation Interventions Intervention Not Indicated Intervention Not Indicated Intervention Not Indicated Intervention Not Indicated Intervention Not Indicated Intervention Not Indicated  Utilities Interventions Intervention Not Indicated Intervention Not Indicated Intervention Not Indicated Intervention Not Indicated Intervention Not Indicated Intervention Not Indicated  Financial Strain Interventions Intervention  Not Indicated -- -- -- -- --      Recommendation:   none  Follow Up Plan:   Closing From:  Complex Care Management  Tobias CHARM [name], PhD [facility], [facility] Social Worker Direct Dial: [phone]  Fax: [phone]

## 2023-11-13 NOTE — Patient Instructions (Signed)

## 2023-11-13 NOTE — Progress Notes (Signed)
 This encounter was created in error - please disregard.

## 2023-11-15 ENCOUNTER — Encounter

## 2023-11-17 ENCOUNTER — Emergency Department (HOSPITAL_COMMUNITY)

## 2023-11-17 ENCOUNTER — Encounter (HOSPITAL_COMMUNITY): Payer: Self-pay

## 2023-11-17 ENCOUNTER — Ambulatory Visit: Admitting: Physician Assistant

## 2023-11-17 ENCOUNTER — Other Ambulatory Visit: Payer: Self-pay

## 2023-11-17 ENCOUNTER — Emergency Department (HOSPITAL_COMMUNITY)
Admission: EM | Admit: 2023-11-17 | Discharge: 2023-11-17 | Disposition: A | Discharge status: Home / Self Care | Attending: Emergency Medicine | Admitting: Emergency Medicine

## 2023-11-17 DIAGNOSIS — I130 Hypertensive heart and chronic kidney disease with heart failure and stage 1 through stage 4 chronic kidney disease, or unspecified chronic kidney disease: Secondary | ICD-10-CM | POA: Diagnosis not present

## 2023-11-17 DIAGNOSIS — Z87891 Personal history of nicotine dependence: Secondary | ICD-10-CM | POA: Insufficient documentation

## 2023-11-17 DIAGNOSIS — M79601 Pain in right arm: Secondary | ICD-10-CM | POA: Diagnosis not present

## 2023-11-17 DIAGNOSIS — I2510 Atherosclerotic heart disease of native coronary artery without angina pectoris: Secondary | ICD-10-CM | POA: Insufficient documentation

## 2023-11-17 DIAGNOSIS — I5032 Chronic diastolic (congestive) heart failure: Secondary | ICD-10-CM | POA: Diagnosis not present

## 2023-11-17 DIAGNOSIS — M19021 Primary osteoarthritis, right elbow: Secondary | ICD-10-CM | POA: Diagnosis not present

## 2023-11-17 DIAGNOSIS — R0600 Dyspnea, unspecified: Secondary | ICD-10-CM | POA: Diagnosis not present

## 2023-11-17 DIAGNOSIS — M79603 Pain in arm, unspecified: Secondary | ICD-10-CM | POA: Diagnosis not present

## 2023-11-17 DIAGNOSIS — R531 Weakness: Secondary | ICD-10-CM | POA: Diagnosis not present

## 2023-11-17 DIAGNOSIS — E039 Hypothyroidism, unspecified: Secondary | ICD-10-CM | POA: Insufficient documentation

## 2023-11-17 DIAGNOSIS — Z743 Need for continuous supervision: Secondary | ICD-10-CM | POA: Diagnosis not present

## 2023-11-17 DIAGNOSIS — R0602 Shortness of breath: Secondary | ICD-10-CM | POA: Diagnosis not present

## 2023-11-17 DIAGNOSIS — Z7902 Long term (current) use of antithrombotics/antiplatelets: Secondary | ICD-10-CM | POA: Diagnosis not present

## 2023-11-17 DIAGNOSIS — E1122 Type 2 diabetes mellitus with diabetic chronic kidney disease: Secondary | ICD-10-CM | POA: Insufficient documentation

## 2023-11-17 DIAGNOSIS — Z79899 Other long term (current) drug therapy: Secondary | ICD-10-CM | POA: Insufficient documentation

## 2023-11-17 DIAGNOSIS — N182 Chronic kidney disease, stage 2 (mild): Secondary | ICD-10-CM | POA: Insufficient documentation

## 2023-11-17 DIAGNOSIS — M25521 Pain in right elbow: Secondary | ICD-10-CM | POA: Diagnosis not present

## 2023-11-17 DIAGNOSIS — R600 Localized edema: Secondary | ICD-10-CM | POA: Diagnosis not present

## 2023-11-17 DIAGNOSIS — R079 Chest pain, unspecified: Secondary | ICD-10-CM | POA: Diagnosis not present

## 2023-11-17 DIAGNOSIS — Z950 Presence of cardiac pacemaker: Secondary | ICD-10-CM | POA: Diagnosis not present

## 2023-11-17 LAB — CBC
HCT: 36.5 % (ref 36.0–46.0)
Hemoglobin: 11.8 g/dL — ABNORMAL LOW (ref 12.0–15.0)
MCH: 30.8 pg (ref 26.0–34.0)
MCHC: 32.3 g/dL (ref 30.0–36.0)
MCV: 95.3 fL (ref 80.0–100.0)
Platelets: 177 K/uL (ref 150–400)
RBC: 3.83 MIL/uL — ABNORMAL LOW (ref 3.87–5.11)
RDW: 12.3 % (ref 11.5–15.5)
WBC: 7.3 K/uL (ref 4.0–10.5)
nRBC: 0 % (ref 0.0–0.2)

## 2023-11-17 LAB — BASIC METABOLIC PANEL WITH GFR
Anion gap: 11 (ref 5–15)
BUN: 29 mg/dL — ABNORMAL HIGH (ref 8–23)
CO2: 22 mmol/L (ref 22–32)
Calcium: 9 mg/dL (ref 8.9–10.3)
Chloride: 107 mmol/L (ref 98–111)
Creatinine, Ser: 1.84 mg/dL — ABNORMAL HIGH (ref 0.44–1.00)
GFR, Estimated: 26 mL/min — ABNORMAL LOW (ref >60)
Glucose, Bld: 165 mg/dL — ABNORMAL HIGH (ref 70–99)
Potassium: 5 mmol/L (ref 3.5–5.1)
Sodium: 140 mmol/L (ref 135–145)

## 2023-11-17 LAB — TROPONIN I (HIGH SENSITIVITY)
Troponin I (High Sensitivity): 32 ng/L — ABNORMAL HIGH (ref <18)
Troponin I (High Sensitivity): 29 ng/L — ABNORMAL HIGH (ref <18)

## 2023-11-17 LAB — BRAIN NATRIURETIC PEPTIDE: B Natriuretic Peptide: 225 pg/mL — ABNORMAL HIGH (ref 0.0–100.0)

## 2023-11-17 MED ORDER — ACETAMINOPHEN 500 MG PO TABS
1000.0000 mg | ORAL_TABLET | Freq: Once | ORAL | Status: AC
  Administered 2023-11-17: 1000 mg via ORAL
  Filled 2023-11-17: qty 2

## 2023-11-17 NOTE — ED Notes (Signed)
RN called [facility].  

## 2023-11-17 NOTE — Discharge Instructions (Signed)
 We evaluated you for your shortness of breath and elbow pain.  Your testing in the emergency department is reassuring.  We feel that it is safe to go home at this time.  Please follow-up with your primary doctor and return for any new or worsening symptoms.

## 2023-11-17 NOTE — ED Notes (Signed)
 Patient transported to X-ray

## 2023-11-17 NOTE — ED Provider Notes (Signed)
 [facility] Provider Note  CSN: [id number] Arrival date & time: 11/17/23 9185  Chief Complaint(s) Arm Pain (R) and Shortness of Breath  HPI [name] is a 89 y.o. female history of coronary artery disease, CHF, CKD, sick sinus syndrome with pacemaker, diabetes presenting with shortness of breath.  She reports that it began this morning.  Denies cough, fevers, chills, chest pain, back pain.  Reports that she had some pain in her right elbow as well.  No falls or injury.  No nausea or vomiting.  No fevers or chills.  No abdominal pain.  No nausea or vomiting.  No diarrhea.  Denies similar episode.   Past Medical History Past Medical History:  Diagnosis Date   Allergic rhinitis    Anemia    Anxiety    Barrett esophagus    CAD (coronary artery disease) 2009   a. Multivessel s/p PCI w/DES 2009 // b. s/p CABG 2011  //  c. LHC 8/15: pLAD 95 ISR, LCx 100, pOM1 40, dRCA 100, S-OM1/OM2 ok, S-D1 ok, S-PDA ok, L-LAD ok, EF 60%   Carotid artery disease (HCC)    a. Carotid US  9/15: RICA 1-39%; LICA 40-59% >> FU 1 year  //  b. Carotid US  9/17: R 1-39%, L 40-59% >> FU 1 year   Chronic diastolic heart failure (HCC)    CKD (chronic kidney disease), stage II    GFR 60-89 ml/min   Depression    Disc disease, degenerative, cervical    Diverticulosis    Gastroparesis    GERD (gastroesophageal reflux disease)    Gout    H/O hiatal hernia    Helicobacter pylori gastritis    History of echocardiogram    a. Echo 11/13: EF 55% to 60%. Grade 2 diastolic dysfunction, MAC, trivial MR, mild LAE, normal RVSF, mild RAE, PASP 39 mmHg  //  b. Echo 4/17: EF 55-60%, normal wall motion, trivial AI, MAC, moderate LAE, mild RVE, PASP 35 mmHg   History of thrombocytopenia    HTN (hypertension)    Hyperlipidemia    Hypothyroidism    LBP (low back pain)    Lumbar disc disease/lumbar spinal stenosis   Macular degeneration    Morbid obesity (HCC)    Myocardial  infarction (HCC)    Osteoarthritis    Osteopenia    PVD (peripheral vascular disease) (HCC)    Sick sinus syndrome (HCC)    MDT Dual-chamber PPM implant 02/2012   Type II or unspecified type diabetes mellitus without mention of complication, not stated as uncontrolled    Patient Active Problem List   Diagnosis Date Noted   Non-pressure chronic ulcer of other part of left foot with unspecified severity (HCC) 07/01/2023   Parkinsonism, unspecified Parkinsonism type (HCC) 07/01/2023   Tinea corporis 07/01/2023   Dermatitis associated with moisture 03/03/2023   Open wound of skin 03/03/2023   Acute pain of left hip 09/01/2022   Need for pneumococcal 20-valent conjugate vaccination 08/22/2022   Rash 05/27/2022   Tremor 05/27/2022   Coronary artery disease involving native coronary artery of native heart without angina pectoris 09/14/2021   Pain due to onychomycosis of toenails of both feet 01/15/2021   Type 2 diabetes mellitus with diabetic dermatitis, without long-term current use of insulin  (HCC) 01/15/2021   Palpitations 01/15/2018   Cardiac pacemaker in situ 01/15/2018   Heel spur, left 09/13/2017   Left knee pain 08/23/2017   CKD (chronic kidney disease), stage III (HCC) 12/04/2016  Chest pain 11/23/2015   Type I (juvenile type) diabetes mellitus with renal manifestations, not stated as uncontrolled(250.41) 04/27/2012   Insulin  dependent diabetes mellitus with complications 04/27/2012   AKI (acute kidney injury) (HCC) 03/05/2012   Diarrhea 03/05/2012   Carotid artery disease (HCC) 08/11/2010   Left shoulder pain 07/08/2010   Preventative health care 07/08/2010   HOARSENESS 06/04/2010   Pruritus 05/11/2010   ANEMIA-NOS 04/02/2010   (HFpEF) heart failure with preserved ejection fraction (HCC) 02/11/2010   SINUS BRADYCARDIA 10/30/2009   Allergic rhinitis 09/11/2009   Backache 09/11/2009   MUSCLE STRAIN, RIGHT BUTTOCK 09/11/2009   Herpes zoster 05/11/2009   SHOULDER PAIN,  LEFT 12/29/2008   Proteinuria 12/12/2008   Abdominal pain 09/24/2007   Hx of CABG 05/03/2007   CHEST PAIN 04/20/2007   Dizziness 02/28/2007   Anxiety state 02/15/2007   GERD 02/15/2007   Gastroparesis 02/15/2007   DISC DISEASE, CERVICAL 02/15/2007   DISC DISEASE, LUMBAR 02/15/2007   SPINAL STENOSIS, LUMBAR 02/15/2007   PERIPHERAL EDEMA 02/15/2007   Personal History of Other Diseases of Digestive Disease 02/15/2007   Hyperlipidemia LDL goal <70 01/01/2007   GOUT 01/01/2007   Morbid obesity (HCC) 01/01/2007   DEPRESSION 01/01/2007   PERIPHERAL VASCULAR DISEASE 01/01/2007   Diverticulosis of colon 01/01/2007   Osteoarthritis 01/01/2007   LOW BACK PAIN 01/01/2007   Disorder of bone and cartilage 01/01/2007   Essential hypertension 10/26/2006   Home Medication(s) Prior to Admission medications   Medication Sig Start Date End Date Taking? Authorizing Provider  ACCU-CHEK GUIDE test strip CHECK GLUCOSE IN THE MORNING AT  [facility] AND AT BEDTIME 11/09/22   Antonio Meth, Yvonne R, DO  Accu-Chek Softclix Lancets lancets CHECK GLUCOSE IN THE MORNING AT  [facility] AND AT BEDTIME 11/09/22   Lowne Chase, Yvonne R, DO  acetaminophen  (TYLENOL ) 500 MG tablet Take 500 mg by mouth every 6 (six) hours as needed for headache (pain).    [provider]  albuterol  (VENTOLIN  HFA) 108 (90 Base) MCG/ACT inhaler Inhale 2 puffs into the lungs every 6 (six) hours as needed for wheezing or shortness of breath. 07/11/23   Cyndi Shaver, PA-C  allopurinol  (ZYLOPRIM ) 100 MG tablet Take 1 tablet (100 mg total) by mouth at bedtime. 04/17/23   Antonio Meth Jamee JONELLE, DO  amLODipine  (NORVASC ) 5 MG tablet Take 1 tablet (5 mg total) by mouth at bedtime. 11/03/23 02/11/24  Rana Lum CROME, NP  atorvastatin  (LIPITOR) 20 MG tablet TAKE 1 TABLET BY MOUTH ONCE  DAILY 06/05/23   Cooper, Michael, MD  azelastine (OPTIVAR) 0.05 % ophthalmic solution Place 1 drop into both eyes 2 (two) times daily. 10/26/15   [provider]   carvedilol  (COREG ) 12.5 MG tablet TAKE 1 TABLET BY MOUTH TWICE  DAILY 10/30/23   Wonda Sharper, MD  clopidogrel  (PLAVIX ) 75 MG tablet TAKE 1 TABLET BY MOUTH DAILY 08/29/23   Wonda Sharper, MD  ENTRESTO  97-103 MG TAKE 1 TABLET BY MOUTH TWICE  DAILY 06/05/23   Cooper, Michael, MD  furosemide  (LASIX ) 40 MG tablet Take 1 tablet by mouth once daily 06/28/23   Wonda Sharper, MD  ketoconazole  (NIZORAL ) 2 % cream Apply 1 Application topically daily. 11/03/23   Mayer, Jodi R, NP  methocarbamol  (ROBAXIN ) 500 MG tablet Take 1 tablet (500 mg total) by mouth every 6 (six) hours as needed for muscle spasms. 06/29/23   Antonio Meth Jamee JONELLE, DO  Multiple Vitamins-Minerals (ONE A DAY WOMEN 50 PLUS) TABS Take 1 tablet by mouth daily.  [provider]  mupirocin  ointment (BACTROBAN ) 2 % Apply 1 Application topically daily as needed. 09/19/23   Cyndi Shaver, PA-C  nitrofurantoin , macrocrystal-monohydrate, (MACROBID ) 100 MG capsule Take 1 capsule (100 mg total) by mouth 2 (two) times daily. 09/10/23   Antonio Cyndee Rockers R, DO  nitroGLYCERIN  (NITROSTAT ) 0.4 MG SL tablet DISSOLVE ONE TABLET UNDER THE TONGUE EVERY 5 MINUTES AS NEEDED FOR CHEST PAIN.  DO NOT EXCEED A TOTAL OF 3 DOSES IN 15 MINUTES 02/27/20   Kathrine Gauze K, NP  nystatin  powder Apply 1 Application topically 3 (three) times daily. 06/29/23   Antonio Cyndee Rockers JONELLE, DO  pantoprazole  (PROTONIX ) 40 MG tablet Take 1 tablet (40 mg total) by mouth daily. 07/28/23   Antonio Cyndee Rockers JONELLE, DO  potassium chloride  (KLOR-CON ) 10 MEQ tablet Take 1 tablet (10 mEq total) by mouth daily. 07/12/22   Lelon Glendia DASEN, PA-C                                                                                                                                    Past Surgical History Past Surgical History:  Procedure Laterality Date   ABDOMINAL HYSTERECTOMY     CARDIAC CATHETERIZATION     2011  DR COOPER (APPT NEXT WEEK)   CHOLECYSTECTOMY     CORONARY ARTERY BYPASS GRAFT   2011   LIMA-LAD, SVG-DIAG, SVG-OM1-OM2, SVG-PDA   CORONARY STENT PLACEMENT     Drug-eluting stent to the left anterior descending, circumflex and right coronary artery in Jan 2009   EYE SURGERY     BIL CATARACT REMOVAL 06/2010   LEFT HEART CATHETERIZATION WITH CORONARY ANGIOGRAM N/A 12/02/2013   Procedure: LEFT HEART CATHETERIZATION WITH CORONARY ANGIOGRAM;  Surgeon: Victory LELON Claudene DOUGLAS, MD;  Location: [facility] CATH LAB;  Service: Cardiovascular;  Laterality: N/A;   OVARIAN CYST REMOVAL     PACEMAKER INSERTION  03/06/12   MDT Adapta L implanted by Dr Kelsie for SSS   PERMANENT PACEMAKER INSERTION N/A 03/06/2012   Procedure: PERMANENT PACEMAKER INSERTION;  Surgeon: Lynwood Kelsie, MD;  Location[facility] C[facility] LAB;  Service: Cardiovascular;  Laterality: N/A;   SHOULDER ARTHROSCOPY  06/16/2011   Procedure: ARTHROSCOPY SHOULDER;  Surgeon: Rome JULIANNA Pepper, MD;  Location: [facility] OR;  Service: Orthopedics;  Laterality: Left;  LEFT SHOULDER ARTHROSCOPY ACROMIALPLASTY, POSSIBLE MINI OPEN CUFF REPAIR    TUBAL LIGATION     Family History Family History  Problem Relation Age of Onset   Diabetes Mother    Hypertension Mother    Heart attack Mother    Stroke Father    Cancer Brother    Deafness Daughter    Other Son        cerebral edema at 18 months   Coronary artery disease Other     Social History Social History   Tobacco Use   Smoking status: Former    Current packs/day: 0.00    Average packs/day: 0.3  packs/day for 28.0 years (7.0 ttl pk-yrs)    Types: Cigarettes    Start date: 5    Quit date: 28    Years since quitting: 31.6   Smokeless tobacco: Never   Tobacco comments:    quit 30 yrs ago  Vaping Use   Vaping status: Never Used  Substance Use Topics   Alcohol use: No   Drug use: No   Allergies Codeine, Hydrocodone, Penicillins, Shellfish allergy, Sulfonamide derivatives, Cipro  [ciprofloxacin  hcl], Morphine  and codeine, Tiazac [diltiazem], Glucophage  [metformin ], and Mevacor  [lovastatin]  Review of Systems Review of Systems  All other systems reviewed and are negative.   Physical Exam Vital Signs  I have reviewed the triage vital signs BP (!) 148/53   Pulse (!) 58   Temp 97.9 F (36.6 C) (Oral)   Resp (!) 22   Ht 5' 7 (1.702 m)   Wt 79.4 kg   SpO2 100%   BMI 27.41 kg/m  Physical Exam Vitals and nursing note reviewed.  Constitutional:      General: She is not in acute distress.    Appearance: She is well-developed.  HENT:     Head: Normocephalic and atraumatic.     Mouth/Throat:     Mouth: Mucous membranes are moist.  Eyes:     Pupils: Pupils are equal, round, and reactive to light.  Cardiovascular:     Rate and Rhythm: Normal rate and regular rhythm.     Heart sounds: No murmur heard. Pulmonary:     Effort: Pulmonary effort is normal. No respiratory distress.     Breath sounds: Normal breath sounds.  Abdominal:     General: Abdomen is flat.     Palpations: Abdomen is soft.     Tenderness: There is no abdominal tenderness.  Musculoskeletal:        General: No tenderness.     Right lower leg: Edema present.     Left lower leg: Edema present.     Comments: Full range of motion of the right upper extremity, no focal tenderness of her elbow, wrist, hand, shoulder.  2+ radial pulse bilaterally  Skin:    General: Skin is warm and dry.  Neurological:     General: No focal deficit present.     Mental Status: She is alert. Mental status is at baseline.  Psychiatric:        Mood and Affect: Mood normal.        Behavior: Behavior normal.     ED Results and Treatments Labs (all labs ordered are listed, but only abnormal results are displayed) Labs Reviewed  BASIC METABOLIC PANEL WITH GFR - Abnormal; Notable for the following components:      Result Value   Glucose, Bld 165 (*)    BUN 29 (*)    Creatinine, Ser 1.84 (*)    GFR, Estimated 26 (*)    All other components within normal limits  CBC - Abnormal; Notable for the following  components:   RBC 3.83 (*)    Hemoglobin 11.8 (*)    All other components within normal limits  BRAIN NATRIURETIC PEPTIDE - Abnormal; Notable for the following components:   B Natriuretic Peptide 225.0 (*)    All other components within normal limits  TROPONIN I (HIGH SENSITIVITY) - Abnormal; Notable for the following components:   Troponin I (High Sensitivity) 32 (*)    All other components within normal limits  TROPONIN I (HIGH SENSITIVITY) - Abnormal; Notable for the following components:  Troponin I (High Sensitivity) 29 (*)    All other components within normal limits                                                                                                                          Radiology DG Elbow 2 Views Right Result Date: 11/17/2023 EXAM: 1 or 2 VIEW(S) XRAY OF THE RIGHT ELBOW COMPARISON: None available. CLINICAL HISTORY: Pain. FINDINGS: BONES AND JOINTS: No acute fracture. No focal osseous lesion. No joint dislocation. No elbow joint effusion. Degenerative changes of the elbow. SOFT TISSUES: The soft tissues are unremarkable. IMPRESSION: 1. No acute abnormality. 2. Degenerative changes of the elbow. Electronically signed by: Manford Cummins MD 11/17/2023 02:20 PM EDT RP Workstation: [id number]   DG Chest 2 View Result Date: 11/17/2023 EXAM: 2 VIEW(S) XRAY OF THE CHEST 11/17/2023 08:44:00 AM COMPARISON: AP radiograph of the chest dated 08/28/2023. CLINICAL HISTORY: CP. Reason for exam: cp; Per triage notes: BIBEMS c/o SOB and R arm pain started this morning. Pt on RA states she just feels like she can't catch her breath. FINDINGS: LUNGS AND PLEURA: The lungs appear clear. No focal pulmonary opacity. No pulmonary edema. No pleural effusion. No pneumothorax. HEART AND MEDIASTINUM: The heart is normal in size. The patient is status post CABG and has a dual lead cardiac pacemaker. No acute abnormality of the cardiac and mediastinal silhouettes. BONES AND SOFT TISSUES: There are  mild-to-moderate degenerative changes throughout the thoracic spine. No acute osseous abnormality. IMPRESSION: 1. No acute cardiopulmonary pathology. Electronically signed by: evalene coho 11/17/2023 08:48 AM EDT RP Workstation: [id number]    Pertinent labs & imaging results that were available during my care of the patient were reviewed by me and considered in my medical decision making (see MDM for details).  Medications Ordered in ED Medications  acetaminophen  (TYLENOL ) tablet 1,000 mg (1,000 mg Oral Given 11/17/23 1042)                                                                                                                                     Procedures Procedures  (including critical care time)  Medical Decision Making / ED Course   MDM:  89 year old presenting to the emergency department shortness of breath.  Patient is overall well-appearing.  Vital signs are reassuring.  EKG shows appropriately paced rhythm.  She does have some leg swelling however she reports this is chronic.  Overall examination is reassuring.  Differential includes pneumonia although no cough, pneumothorax, ACS, CHF.  Considered pulmonary embolism however no chest pain or pleuritic pain, vital signs are reassuring, no hypoxia.  Will obtain chest x-ray, labs including troponin, reassess.  She is also reporting right elbow pain.  On exam she has equal pulses, no signs of trauma, no sign of edema or DVT.  Unclear cause of pain, not sure if it is related to her shortness of breath.  Will reassess. Clinical Course as of 11/17/23 1524  Fri Nov 17, 2023  1523 Workup overall reassuring, troponin downtrending. Also obtained XR elbow given pain and negative. Patient reports symptoms have improved. Doubt acute dangerous cause of symptoms requiring hospital admission. Will discharge patient to home. All questions answered. Patient comfortable with plan of discharge. Return precautions discussed with patient and  specified on the after visit summary.  [WS]    Clinical Course User Index [WS] Francesca Elsie CROME, MD     Additional history obtained: -Additional history obtained from family and ems -External records from outside source obtained and reviewed including: Chart review including previous notes, labs, imaging, consultation notes including prior notes    Lab Tests: -I ordered, reviewed, and interpreted labs.   The pertinent results include:   Labs Reviewed  BASIC METABOLIC PANEL WITH GFR - Abnormal; Notable for the following components:      Result Value   Glucose, Bld 165 (*)    BUN 29 (*)    Creatinine, Ser 1.84 (*)    GFR, Estimated 26 (*)    All other components within normal limits  CBC - Abnormal; Notable for the following components:   RBC 3.83 (*)    Hemoglobin 11.8 (*)    All other components within normal limits  BRAIN NATRIURETIC PEPTIDE - Abnormal; Notable for the following components:   B Natriuretic Peptide 225.0 (*)    All other components within normal limits  TROPONIN I (HIGH SENSITIVITY) - Abnormal; Notable for the following components:   Troponin I (High Sensitivity) 32 (*)    All other components within normal limits  TROPONIN I (HIGH SENSITIVITY) - Abnormal; Notable for the following components:   Troponin I (High Sensitivity) 29 (*)    All other components within normal limits    Notable for roughly stable CKD, mild anemia, stable mildly elevated troponin, mild BNP elevation  EKG   EKG Interpretation Date/Time:  Friday November 17 2023 08:22:55 EDT Ventricular Rate:  60 PR Interval:  128 QRS Duration:  193 QT Interval:  498 QTC Calculation: 498 R Axis:   -72  Text Interpretation: AV dual-paced rhythm Ventricular premature complex Confirmed by Francesca Elsie ([id number]) on 11/17/2023 8:39:08 AM         Imaging Studies ordered: I ordered imaging studies including CXR On my interpretation imaging demonstrates no acute process I independently  visualized and interpreted imaging. I agree with the radiologist interpretation   Medicines ordered and prescription drug management: Meds ordered this encounter  Medications   acetaminophen  (TYLENOL ) tablet 1,000 mg    -I have reviewed the patients home medicines and have made adjustments as needed  Social Determinants of Health:  Diagnosis or treatment significantly limited by social determinants of health: obesity   Reevaluation: After the interventions noted above, I reevaluated the patient and found that their symptoms have improved  Co morbidities that complicate the patient evaluation  Past Medical History:  Diagnosis Date   Allergic rhinitis    Anemia  Anxiety    Barrett esophagus    CAD (coronary artery disease) 2009   a. Multivessel s/p PCI w/DES 2009 // b. s/p CABG 2011  //  c. LHC 8/15: pLAD 95 ISR, LCx 100, pOM1 40, dRCA 100, S-OM1/OM2 ok, S-D1 ok, S-PDA ok, L-LAD ok, EF 60%   Carotid artery disease (HCC)    a. Carotid US  9/15: RICA 1-39%; LICA 40-59% >> FU 1 year  //  b. Carotid US  9/17: R 1-39%, L 40-59% >> FU 1 year   Chronic diastolic heart failure (HCC)    CKD (chronic kidney disease), stage II    GFR 60-89 ml/min   Depression    Disc disease, degenerative, cervical    Diverticulosis    Gastroparesis    GERD (gastroesophageal reflux disease)    Gout    H/O hiatal hernia    Helicobacter pylori gastritis    History of echocardiogram    a. Echo 11/13: EF 55% to 60%. Grade 2 diastolic dysfunction, MAC, trivial MR, mild LAE, normal RVSF, mild RAE, PASP 39 mmHg  //  b. Echo 4/17: EF 55-60%, normal wall motion, trivial AI, MAC, moderate LAE, mild RVE, PASP 35 mmHg   History of thrombocytopenia    HTN (hypertension)    Hyperlipidemia    Hypothyroidism    LBP (low back pain)    Lumbar disc disease/lumbar spinal stenosis   Macular degeneration    Morbid obesity (HCC)    Myocardial infarction (HCC)    Osteoarthritis    Osteopenia    PVD (peripheral  vascular disease) (HCC)    Sick sinus syndrome (HCC)    MDT Dual-chamber PPM implant 02/2012   Type II or unspecified type diabetes mellitus without mention of complication, not stated as uncontrolled       Dispostion: Disposition decision including need for hospitalization was considered, and patient discharged from emergency department.    Final Clinical Impression(s) / ED Diagnoses Final diagnoses:  Dyspnea, unspecified type     This chart was dictated using voice recognition software.  Despite best efforts to proofread,  errors can occur which can change the documentation meaning.    Francesca Elsie CROME, MD 11/17/23 [phone]

## 2023-11-17 NOTE — ED Notes (Signed)
 Patients returned to room

## 2023-11-17 NOTE — ED Notes (Signed)
 RN updated [name], Grandson

## 2023-11-17 NOTE — ED Triage Notes (Signed)
 BIBEMS c/o SOB and R arm pain started this morning. Pt on RA states she just feels like she can't catch her breath.

## 2023-11-17 NOTE — ED Notes (Signed)
Pt was able to stand with assistance.

## 2023-11-17 NOTE — ED Notes (Signed)
 Daughter arrived to take patient home. PTAR cancelled. Pt assisted into wheelchair and taken out to daughter's vehicle. AVS provided to and discussed with patient and family member. Pt verbalizes understanding of discharge instructions and denies any questions or concerns at this time.

## 2023-11-17 NOTE — ED Notes (Signed)
 IT contacted to report that he [name] is not working correctly.

## 2023-11-19 ENCOUNTER — Other Ambulatory Visit: Payer: Self-pay | Admitting: Cardiovascular Disease

## 2023-11-19 ENCOUNTER — Other Ambulatory Visit: Payer: Self-pay | Admitting: Physician Assistant

## 2023-11-19 DIAGNOSIS — I5032 Chronic diastolic (congestive) heart failure: Secondary | ICD-10-CM

## 2023-11-19 DIAGNOSIS — N1832 Chronic kidney disease, stage 3b: Secondary | ICD-10-CM

## 2023-11-19 DIAGNOSIS — I2510 Atherosclerotic heart disease of native coronary artery without angina pectoris: Secondary | ICD-10-CM

## 2023-11-19 DIAGNOSIS — I10 Essential (primary) hypertension: Secondary | ICD-10-CM

## 2023-11-19 DIAGNOSIS — E782 Mixed hyperlipidemia: Secondary | ICD-10-CM

## 2023-11-21 ENCOUNTER — Other Ambulatory Visit: Payer: Self-pay

## 2023-11-21 DIAGNOSIS — E782 Mixed hyperlipidemia: Secondary | ICD-10-CM

## 2023-11-21 DIAGNOSIS — I10 Essential (primary) hypertension: Secondary | ICD-10-CM

## 2023-11-21 DIAGNOSIS — N1832 Chronic kidney disease, stage 3b: Secondary | ICD-10-CM

## 2023-11-21 DIAGNOSIS — I5032 Chronic diastolic (congestive) heart failure: Secondary | ICD-10-CM

## 2023-11-21 DIAGNOSIS — I2510 Atherosclerotic heart disease of native coronary artery without angina pectoris: Secondary | ICD-10-CM

## 2023-11-21 MED ORDER — FUROSEMIDE 40 MG PO TABS: 40.0000 mg | ORAL_TABLET | Freq: Every day | ORAL | 3 refills | Status: DC

## 2023-11-21 MED ORDER — POTASSIUM CHLORIDE ER 10 MEQ PO TBCR: 10.0000 meq | EXTENDED_RELEASE_TABLET | Freq: Every day | ORAL | 3 refills | Status: AC

## 2023-11-21 NOTE — Telephone Encounter (Signed)
 Pt of Dr. Wonda. Last seen by Lum Louis NP on 11/03/23. She was in the hospital on 11/17/23. Please advise on if this refill is good to refill.

## 2023-11-24 ENCOUNTER — Other Ambulatory Visit: Payer: Self-pay | Admitting: *Deleted

## 2023-11-24 ENCOUNTER — Encounter: Payer: Self-pay | Admitting: *Deleted

## 2023-11-24 DIAGNOSIS — I10 Essential (primary) hypertension: Secondary | ICD-10-CM | POA: Diagnosis not present

## 2023-11-24 NOTE — Patient Instructions (Signed)
 Visit Information  Thank you for taking time to visit with me today. Please don't hesitate to contact me if I can be of assistance to you before our next scheduled appointment.  Your next care management appointment is by telephone on 11/27/23 at 1315    Please call the care guide team at [phone] if you need to cancel, schedule, or reschedule an appointment.   Please call the Suicide and Crisis Lifeline: 988 call the [place]  National Suicide Prevention Lifeline: (725) 846-2619 or TTY: 770-377-3325 TTY 202-135-4687) to talk to a trained counselor call 1-800-273-TALK (toll free, 24 hour hotline) go to [facility] [address], [place] [phone]) call 911 if you are experiencing a Mental Health or Behavioral Health Crisis or need someone to talk to.  [name] L. Ramonita, RN, BSN, CCM [place]  [facility], [facility] RN Care Manager Direct Dial: [phone]  Fax: [phone]

## 2023-11-24 NOTE — Patient Outreach (Signed)
 Complex Care Management   Visit Note  11/24/2023  Name:  [name] MRN: [id number] DOB: Jan 22, 1933  Situation: Referral received for Complex Care Management related to Heart Failure, Diabetes with Complications, and Hypertension I obtained verbal consent from Caregiver.  Visit completed with Asher grandson who lives with her  on the phone  Transfer patient from previous RN CCM, Olam KANDICE Asher reach after an unsuccessful attempt to reach patient. He states he is not up to date on the home health services at this time Referred RN CCM to patient who was not answering her phone as she was getting ready to go to complete lab work. Plus her phone does not have voice mail services activated Asher will also have patient to return a call to RN CCM  Background:   Past Medical History:  Diagnosis Date   Allergic rhinitis    Anemia    Anxiety    Barrett esophagus    CAD (coronary artery disease) 2009   a. Multivessel s/p PCI w/DES 2009 // b. s/p CABG 2011  //  c. LHC 8/15: pLAD 95 ISR, LCx 100, pOM1 40, dRCA 100, S-OM1/OM2 ok, S-D1 ok, S-PDA ok, L-LAD ok, EF 60%   Carotid artery disease (HCC)    a. Carotid US  9/15: RICA 1-39%; LICA 40-59% >> FU 1 year  //  b. Carotid US  9/17: R 1-39%, L 40-59% >> FU 1 year   Chronic diastolic heart failure (HCC)    CKD (chronic kidney disease), stage II    GFR 60-89 ml/min   Depression    Disc disease, degenerative, cervical    Diverticulosis    Gastroparesis    GERD (gastroesophageal reflux disease)    Gout    H/O hiatal hernia    Helicobacter pylori gastritis    History of echocardiogram    a. Echo 11/13: EF 55% to 60%. Grade 2 diastolic dysfunction, MAC, trivial MR, mild LAE, normal RVSF, mild RAE, PASP 39 mmHg  //  b. Echo 4/17: EF 55-60%, normal wall motion, trivial AI, MAC, moderate LAE, mild RVE, PASP 35 mmHg   History of thrombocytopenia    HTN (hypertension)    Hyperlipidemia    Hypothyroidism    LBP (low back pain)    Lumbar disc  disease/lumbar spinal stenosis   Macular degeneration    Morbid obesity (HCC)    Myocardial infarction (HCC)    Osteoarthritis    Osteopenia    PVD (peripheral vascular disease) (HCC)    Sick sinus syndrome (HCC)    MDT Dual-chamber PPM implant 02/2012   Type II or unspecified type diabetes mellitus without mention of complication, not stated as uncontrolled     Assessment: Patient Reported Symptoms:  Cognitive Cognitive Status: Unable to Assess      Neurological Neurological Review of Symptoms: No symptoms reported    HEENT HEENT Symptoms Reported: Not assessed      Cardiovascular Cardiovascular Symptoms Reported: Not assessed    Respiratory Respiratory Symptoms Reported: Not assesed    Endocrine Endocrine Symptoms Reported: Not assessed    Gastrointestinal Gastrointestinal Symptoms Reported: Not assessed      Genitourinary Genitourinary Symptoms Reported: Not assessed    Integumentary Integumentary Symptoms Reported: Not assessed    Musculoskeletal Musculoskelatal Symptoms Reviewed: Difficulty walking, Unsteady gait Additional Musculoskeletal Details: walker for mobility Musculoskeletal Management Strategies: Medical device, Routine screening Musculoskeletal Self-Management Outcome: 3 (uncertain) Falls in the past year?: No Number of falls in past year: 1 or less Was there an injury  with Fall?: No Fall Risk Category Calculator: 0 Patient Fall Risk Level: Low Fall Risk Patient at Risk for Falls Due to: Impaired mobility, Impaired balance/gait Fall risk Follow up: Falls evaluation completed  Psychosocial Psychosocial Symptoms Reported: Not assessed            01/02/2023    1:05 PM  Depression screen PHQ 2/9  Decreased Interest 0  Down, Depressed, Hopeless 0  PHQ - 2 Score 0    There were no vitals filed for this visit.  Medications Reviewed Today   Medications were not reviewed in this encounter     Recommendation:   PCP Follow-up  Follow Up Plan:    Telephone follow up appointment date/time:  11/27/23 1315    Danthony Kendrix L. Ramonita, RN, BSN, CCM [place]  [facility], [facility] RN Care Manager Direct Dial: [phone]  Fax: [phone]

## 2023-11-25 LAB — BASIC METABOLIC PANEL WITH GFR
BUN/Creatinine Ratio: 16 (ref 12–28)
BUN: 30 mg/dL — ABNORMAL HIGH (ref 8–27)
CO2: 22 mmol/L (ref 20–29)
Calcium: 8.9 mg/dL (ref 8.7–10.3)
Chloride: 106 mmol/L (ref 96–106)
Creatinine, Ser: 1.92 mg/dL — ABNORMAL HIGH (ref 0.57–1.00)
Glucose: 187 mg/dL — ABNORMAL HIGH (ref 70–99)
Potassium: 4.8 mmol/L (ref 3.5–5.2)
Sodium: 142 mmol/L (ref 134–144)
eGFR: 25 mL/min/1.73 — ABNORMAL LOW (ref >59)

## 2023-11-27 ENCOUNTER — Encounter: Payer: Self-pay | Admitting: *Deleted

## 2023-11-27 ENCOUNTER — Telehealth: Payer: Self-pay | Admitting: *Deleted

## 2023-11-27 ENCOUNTER — Ambulatory Visit: Payer: Self-pay

## 2023-11-27 NOTE — Telephone Encounter (Signed)
 FYI Only or Action Required?: FYI only for provider.  Patient was last seen in primary care on 09/05/2023 by Antonio Meth, [name], DO.  Called Nurse Triage reporting Arm Pain.  Symptoms began several weeks ago.  Interventions attempted: OTC medications: Tylenol .  Symptoms are: gradually worsening.  Triage Disposition: See PCP When Office is Open (Within 3 Days)  Patient/caregiver understands and will follow disposition?: Yes Copied from CRM #[id number]. Topic: Clinical - Red Word Triage >> Nov 27, 2023 11:22 AM Jayma L wrote: Red Word that prompted transfer to Nurse Triage: patient called in said she was seen on Friday at ER for shoulder and arm pain on right said, said the pain is getting worse and its from her elbow to shoulder and she's not sure why it hurts so bad . Reason for Disposition  [1] MODERATE pain (e.g., interferes with normal activities) AND [2] present > 3 days  Answer Assessment - Initial Assessment Questions 1. ONSET: When did the pain start?     3-4 weeks  2. LOCATION: Where is the pain located?     Right elbow and radiates up to shoulder  3. PAIN: How bad is the pain? (Scale 1-10; or mild, moderate, severe)     Severe pain  4. WORK OR EXERCISE: Has there been any recent work or exercise that involved this part of the body?     No  5. CAUSE: What do you think is causing the elbow pain?     Unsure of cause  6. OTHER SYMPTOMS: Do you have any other symptoms? (e.g., neck pain, elbow swelling, rash, fever)     Feels a little warm:  7. PREGNANCY: Is there any chance you are pregnant? When was your last menstrual period?     no  Protocols used: Elbow Pain-A-AH

## 2023-11-27 NOTE — Telephone Encounter (Signed)
 Appt scheduled

## 2023-11-28 ENCOUNTER — Ambulatory Visit: Payer: Self-pay | Admitting: Family

## 2023-11-28 ENCOUNTER — Ambulatory Visit (INDEPENDENT_AMBULATORY_CARE_PROVIDER_SITE_OTHER): Admitting: Family

## 2023-11-28 ENCOUNTER — Encounter: Payer: Self-pay | Admitting: Family

## 2023-11-28 VITALS — BP 138/64 | Ht 67.0 in | Wt 183.8 lb

## 2023-11-28 DIAGNOSIS — I5032 Chronic diastolic (congestive) heart failure: Secondary | ICD-10-CM

## 2023-11-28 DIAGNOSIS — M25521 Pain in right elbow: Secondary | ICD-10-CM | POA: Diagnosis not present

## 2023-11-28 DIAGNOSIS — G20C Parkinsonism, unspecified: Secondary | ICD-10-CM

## 2023-11-28 DIAGNOSIS — I2510 Atherosclerotic heart disease of native coronary artery without angina pectoris: Secondary | ICD-10-CM

## 2023-11-28 LAB — COMPREHENSIVE METABOLIC PANEL WITH GFR
ALT: 13 U/L (ref 0–35)
AST: 16 U/L (ref 0–37)
Albumin: 3.7 g/dL (ref 3.5–5.2)
Alkaline Phosphatase: 81 U/L (ref 39–117)
BUN: 36 mg/dL — ABNORMAL HIGH (ref 6–23)
CO2: 29 meq/L (ref 19–32)
Calcium: 8.3 mg/dL — ABNORMAL LOW (ref 8.4–10.5)
Chloride: 105 meq/L (ref 96–112)
Creatinine, Ser: 1.82 mg/dL — ABNORMAL HIGH (ref 0.40–1.20)
GFR: 24.42 mL/min — ABNORMAL LOW (ref >60.00)
Glucose, Bld: 148 mg/dL — ABNORMAL HIGH (ref 70–99)
Potassium: 4.1 meq/L (ref 3.5–5.1)
Sodium: 142 meq/L (ref 135–145)
Total Bilirubin: 0.5 mg/dL (ref 0.2–1.2)
Total Protein: 6.3 g/dL (ref 6.0–8.3)

## 2023-11-28 LAB — URIC ACID: Uric Acid, Serum: 9.3 mg/dL — ABNORMAL HIGH (ref 2.4–7.0)

## 2023-11-28 MED ORDER — PREDNISONE 20 MG PO TABS: 20.0000 mg | ORAL_TABLET | Freq: Every day | ORAL | 0 refills | Status: DC

## 2023-11-28 NOTE — Progress Notes (Signed)
 [name] is a 89 y.o. female with the following history as recorded in EpicCare:  Patient Active Problem List   Diagnosis Date Noted   Non-pressure chronic ulcer of other part of left foot with unspecified severity (HCC) 07/01/2023   Parkinsonism, unspecified Parkinsonism type (HCC) 07/01/2023   Tinea corporis 07/01/2023   Dermatitis associated with moisture 03/03/2023   Open wound of skin 03/03/2023   Acute pain of left hip 09/01/2022   Need for pneumococcal 20-valent conjugate vaccination 08/22/2022   Rash 05/27/2022   Tremor 05/27/2022   Coronary artery disease involving native coronary artery of native heart without angina pectoris 09/14/2021   Pain due to onychomycosis of toenails of both feet 01/15/2021   Type 2 diabetes mellitus with diabetic dermatitis, without long-term current use of insulin  (HCC) 01/15/2021   Palpitations 01/15/2018   Cardiac pacemaker in situ 01/15/2018   Heel spur, left 09/13/2017   Left knee pain 08/23/2017   CKD (chronic kidney disease), stage III (HCC) 12/04/2016   Chest pain 11/23/2015   Type I (juvenile type) diabetes mellitus with renal manifestations, not stated as uncontrolled(250.41) 04/27/2012   Insulin  dependent diabetes mellitus with complications 04/27/2012   AKI (acute kidney injury) (HCC) 03/05/2012   Diarrhea 03/05/2012   Carotid artery disease (HCC) 08/11/2010   Left shoulder pain 07/08/2010   Preventative health care 07/08/2010   HOARSENESS 06/04/2010   Pruritus 05/11/2010   ANEMIA-NOS 04/02/2010   (HFpEF) heart failure with preserved ejection fraction (HCC) 02/11/2010   SINUS BRADYCARDIA 10/30/2009   Allergic rhinitis 09/11/2009   Backache 09/11/2009   MUSCLE STRAIN, RIGHT BUTTOCK 09/11/2009   Herpes zoster 05/11/2009   SHOULDER PAIN, LEFT 12/29/2008   Proteinuria 12/12/2008   Abdominal pain 09/24/2007   Hx of CABG 05/03/2007   CHEST PAIN 04/20/2007   Dizziness 02/28/2007   Anxiety state 02/15/2007   GERD 02/15/2007    Gastroparesis 02/15/2007   DISC DISEASE, CERVICAL 02/15/2007   DISC DISEASE, LUMBAR 02/15/2007   SPINAL STENOSIS, LUMBAR 02/15/2007   PERIPHERAL EDEMA 02/15/2007   Personal History of Other Diseases of Digestive Disease 02/15/2007   Hyperlipidemia LDL goal <70 01/01/2007   GOUT 01/01/2007   Morbid obesity (HCC) 01/01/2007   DEPRESSION 01/01/2007   PERIPHERAL VASCULAR DISEASE 01/01/2007   Diverticulosis of colon 01/01/2007   Osteoarthritis 01/01/2007   LOW BACK PAIN 01/01/2007   Disorder of bone and cartilage 01/01/2007   Essential hypertension 10/26/2006    Current Outpatient Medications  Medication Sig Dispense Refill   ACCU-CHEK GUIDE test strip CHECK GLUCOSE IN THE MORNING AT  NOON AND AT BEDTIME 100 strip 9   Accu-Chek Softclix Lancets lancets CHECK GLUCOSE IN THE MORNING AT  NOON AND AT BEDTIME 100 each 9   acetaminophen  (TYLENOL ) 500 MG tablet Take 500 mg by mouth every 6 (six) hours as needed for headache (pain).     albuterol  (VENTOLIN  HFA) 108 (90 Base) MCG/ACT inhaler Inhale 2 puffs into the lungs every 6 (six) hours as needed for wheezing or shortness of breath. 8 g 2   allopurinol  (ZYLOPRIM ) 100 MG tablet Take 1 tablet (100 mg total) by mouth at bedtime. 90 tablet 3   amLODipine  (NORVASC ) 5 MG tablet Take 1 tablet (5 mg total) by mouth at bedtime. 100 each 3   atorvastatin  (LIPITOR) 20 MG tablet TAKE 1 TABLET BY MOUTH ONCE  DAILY 100 tablet 2   azelastine (OPTIVAR) 0.05 % ophthalmic solution Place 1 drop into both eyes 2 (two) times daily.  carvedilol  (COREG ) 12.5 MG tablet TAKE 1 TABLET BY MOUTH TWICE  DAILY 200 tablet 0   clopidogrel  (PLAVIX ) 75 MG tablet TAKE 1 TABLET BY MOUTH DAILY 100 tablet 0   ENTRESTO  97-103 MG TAKE 1 TABLET BY MOUTH TWICE  DAILY 200 tablet 2   furosemide  (LASIX ) 40 MG tablet Take 1 tablet (40 mg total) by mouth daily. 90 tablet 3   ketoconazole  (NIZORAL ) 2 % cream Apply 1 Application topically daily. 30 g 0   methocarbamol  (ROBAXIN ) 500 MG  tablet Take 1 tablet (500 mg total) by mouth every 6 (six) hours as needed for muscle spasms. 120 tablet 0   Multiple Vitamins-Minerals (ONE A DAY WOMEN 50 PLUS) TABS Take 1 tablet by mouth daily.     mupirocin  ointment (BACTROBAN ) 2 % Apply 1 Application topically daily as needed. 22 g 0   nitrofurantoin , macrocrystal-monohydrate, (MACROBID ) 100 MG capsule Take 1 capsule (100 mg total) by mouth 2 (two) times daily. 14 capsule 0   nitroGLYCERIN  (NITROSTAT ) 0.4 MG SL tablet DISSOLVE ONE TABLET UNDER THE TONGUE EVERY 5 MINUTES AS NEEDED FOR CHEST PAIN.  DO NOT EXCEED A TOTAL OF 3 DOSES IN 15 MINUTES 25 tablet 3   nystatin  powder Apply 1 Application topically 3 (three) times daily. 15 g 0   pantoprazole  (PROTONIX ) 40 MG tablet Take 1 tablet (40 mg total) by mouth daily. 90 tablet 3   potassium chloride  (KLOR-CON ) 10 MEQ tablet Take 1 tablet (10 mEq total) by mouth daily. 90 tablet 3   predniSONE  (DELTASONE ) 20 MG tablet Take 1 tablet (20 mg total) by mouth daily with breakfast. 5 tablet 0   No current facility-administered medications for this visit.    Allergies: Codeine, Hydrocodone, Penicillins, Shellfish allergy, Sulfonamide derivatives, Cipro  [ciprofloxacin  hcl], Morphine  and codeine, Tiazac [diltiazem], Glucophage  [metformin ], and Mevacor [lovastatin]  Past Medical History:  Diagnosis Date   Allergic rhinitis    Anemia    Anxiety    Barrett esophagus    CAD (coronary artery disease) 2009   a. Multivessel s/p PCI w/DES 2009 // b. s/p CABG 2011  //  c. LHC 8/15: pLAD 95 ISR, LCx 100, pOM1 40, dRCA 100, S-OM1/OM2 ok, S-D1 ok, S-PDA ok, L-LAD ok, EF 60%   Carotid artery disease (HCC)    a. Carotid US  9/15: RICA 1-39%; LICA 40-59% >> FU 1 year  //  b. Carotid US  9/17: R 1-39%, L 40-59% >> FU 1 year   Chronic diastolic heart failure (HCC)    CKD (chronic kidney disease), stage II    GFR 60-89 ml/min   Depression    Disc disease, degenerative, cervical    Diverticulosis    Gastroparesis     GERD (gastroesophageal reflux disease)    Gout    H/O hiatal hernia    Helicobacter pylori gastritis    History of echocardiogram    a. Echo 11/13: EF 55% to 60%. Grade 2 diastolic dysfunction, MAC, trivial MR, mild LAE, normal RVSF, mild RAE, PASP 39 mmHg  //  b. Echo 4/17: EF 55-60%, normal wall motion, trivial AI, MAC, moderate LAE, mild RVE, PASP 35 mmHg   History of thrombocytopenia    HTN (hypertension)    Hyperlipidemia    Hypothyroidism    LBP (low back pain)    Lumbar disc disease/lumbar spinal stenosis   Macular degeneration    Morbid obesity (HCC)    Myocardial infarction (HCC)    Osteoarthritis    Osteopenia    PVD (peripheral vascular  disease) [facility])    Sick sinus syndrome (HCC)    MDT Dual-chamber PPM implant 02/2012   Type II or unspecified type diabetes mellitus without mention of complication, not stated as uncontrolled     Past Surgical History:  Procedure Laterality Date   ABDOMINAL HYSTERECTOMY     CARDIAC CATHETERIZATION     2011  DR COOPER (APPT NEXT WEEK)   CHOLECYSTECTOMY     CORONARY ARTERY BYPASS GRAFT  2011   LIMA-LAD, SVG-DIAG, SVG-OM1-OM2, SVG-PDA   CORONARY STENT PLACEMENT     Drug-eluting stent to the left anterior descending, circumflex and right coronary artery in Jan 2009   EYE SURGERY     BIL CATARACT REMOVAL 06/2010   LEFT HEART CATHETERIZATION WITH CORONARY ANGIOGRAM N/A 12/02/2013   Procedure: LEFT HEART CATHETERIZATION WITH CORONARY ANGIOGRAM;  Surgeon: Victory LELON Claudene DOUGLAS, MD;  Location: [facility] CATH LAB;  Service: Cardiovascular;  Laterality: N/A;   OVARIAN CYST REMOVAL     PACEMAKER INSERTION  03/06/12   MDT Adapta L implanted by Dr Kelsie for SSS   PERMANENT PACEMAKER INSERTION N/A 03/06/2012   Procedure: PERMANENT PACEMAKER INSERTION;  Surgeon: Lynwood Kelsie, MD;  Location[facility] CATH LAB;  Service: Cardiovascular;  Laterality: N/A;   SHOULDER ARTHROSCOPY  06/16/2011   Procedure: ARTHROSCOPY SHOULDER;  Surgeon: Rome JULIANNA Pepper, MD;  Location: [facility]  OR;  Service: Orthopedics;  Laterality: Left;  LEFT SHOULDER ARTHROSCOPY ACROMIALPLASTY, POSSIBLE MINI OPEN CUFF REPAIR    TUBAL LIGATION      Family History  Problem Relation Age of Onset   Diabetes Mother    Hypertension Mother    Heart attack Mother    Stroke Father    Cancer Brother    Deafness Daughter    Other Son        cerebral edema at 18 months   Coronary artery disease Other     Social History   Tobacco Use   Smoking status: Former    Current packs/day: 0.00    Average packs/day: 0.3 packs/day for 28.0 years (7.0 ttl pk-yrs)    Types: Cigarettes    Start date: 1966    Quit date: 71    Years since quitting: 31.6   Smokeless tobacco: Never   Tobacco comments:    quit 30 yrs ago  Substance Use Topics   Alcohol use: No    Subjective:   Right elbow pain x 6 weeks- seems to be getting worse. Does have history of gout- takes Allopurinol  100 mg daily; no known injury or trauma;   Objective:  Vitals:   11/28/23 1100  BP: 138/64  Weight: 183 lb 12.8 oz (83.4 kg)  Height: 5' 7 (1.702 m)    General: Well developed, well nourished, in no acute distress  Skin : Warm and dry.  Head: Normocephalic and atraumatic  Eyes: Sclera and conjunctiva clear; pupils round and reactive to light; extraocular movements intact  Ears: External normal; canals clear; tympanic membranes normal  Oropharynx: Pink, supple. No suspicious lesions  Neck: Supple without thyromegaly, adenopathy  Lungs: Respirations unlabored;  Musculoskeletal: No deformities; no active joint inflammation  Extremities: No edema, cyanosis, clubbing  Vessels: Symmetric bilaterally  Neurologic: Alert and oriented; speech intact; face symmetrical; uses rolling walker;  Assessment:  1. Right elbow pain   2. Chronic heart failure with preserved ejection fraction (HCC)   3. Coronary artery disease involving native coronary artery of native heart without angina pectoris   4. Parkinsonism, unspecified  Parkinsonism type (HCC)  Plan:  ? Gout vs tendonitis; will give 5 day trial of prednisone  20 mg every day x 5 days; can apply heat to area; if no improvement, follow up with orthopedist; if uric acid level is markedly elevated, to consider increasing dosage of Allopurinol . Order updated for new rolling walker for patient; she understands that [facility] will be reaching out to help her get the new equipment.   No follow-ups on file.  Orders Placed This Encounter  Procedures   For home use only DME 4 wheeled rolling walker with seat ([id number])    Patient needs a walker to treat with the following condition:   Heart failure (HCC) [257766]   Comp Met (CMET)   Uric acid   Ambulatory referral to Orthopedic Surgery    Referral Priority:   Routine    Referral Type:   Surgical    Referral Reason:   Specialty Services Required    Requested Specialty:   Orthopedic Surgery    Number of Visits Requested:   1    Requested Prescriptions   Signed Prescriptions Disp Refills   predniSONE  (DELTASONE ) 20 MG tablet 5 tablet 0    Sig: Take 1 tablet (20 mg total) by mouth daily with breakfast.

## 2023-12-01 DIAGNOSIS — M79671 Pain in right foot: Secondary | ICD-10-CM | POA: Diagnosis not present

## 2023-12-01 DIAGNOSIS — L97529 Non-pressure chronic ulcer of other part of left foot with unspecified severity: Secondary | ICD-10-CM | POA: Diagnosis not present

## 2023-12-01 DIAGNOSIS — I5032 Chronic diastolic (congestive) heart failure: Secondary | ICD-10-CM | POA: Diagnosis not present

## 2023-12-01 DIAGNOSIS — M1612 Unilateral primary osteoarthritis, left hip: Secondary | ICD-10-CM | POA: Diagnosis not present

## 2023-12-01 DIAGNOSIS — E1143 Type 2 diabetes mellitus with diabetic autonomic (poly)neuropathy: Secondary | ICD-10-CM | POA: Diagnosis not present

## 2023-12-04 NOTE — Addendum Note (Signed)
 Addended by: BILLY CAMELIA CROME on: 12/04/2023 10:47 AM   Modules accepted: Orders

## 2023-12-13 ENCOUNTER — Telehealth: Payer: Self-pay

## 2023-12-13 NOTE — Patient Outreach (Signed)
 Complex Care Management Note Care Guide Note  12/13/2023 Name: [name] MRN: [id number] DOB: Dec 25, 1932   Complex Care Management Outreach Attempts: A third unsuccessful outreach was attempted today to offer the patient with information about available complex care management services.  Follow Up Plan:  No further outreach attempts will be made at this time. We have been unable to contact the patient to offer or enroll patient in complex care management services.  Encounter Outcome:  No Answer  Shereen Gin [facility] Assistant Direct Dial: [phone]  Fax: [phone] Website: [email or web address]

## 2023-12-15 ENCOUNTER — Encounter: Payer: Self-pay | Admitting: Family Medicine

## 2023-12-15 ENCOUNTER — Ambulatory Visit: Admitting: Family Medicine

## 2023-12-15 VITALS — BP 132/70 | HR 61 | Temp 98.1°F | Resp 18 | Ht 67.0 in | Wt 180.0 lb

## 2023-12-15 DIAGNOSIS — M25521 Pain in right elbow: Secondary | ICD-10-CM

## 2023-12-15 DIAGNOSIS — Z23 Encounter for immunization: Secondary | ICD-10-CM

## 2023-12-15 DIAGNOSIS — N1832 Chronic kidney disease, stage 3b: Secondary | ICD-10-CM | POA: Diagnosis not present

## 2023-12-15 MED ORDER — PREDNISONE 10 MG PO TABS: ORAL_TABLET | ORAL | 0 refills | Status: DC

## 2023-12-15 NOTE — Assessment & Plan Note (Signed)
 Pt has app with ortho in next 7-10 days

## 2023-12-15 NOTE — Progress Notes (Signed)
 Subjective:    Patient ID: [name], female    DOB: 07/13/1932, 89 y.o.   MRN: [id number]  Chief Complaint  Patient presents with   Discuss Kidneys    Follow up from visit with Leita    HPI Patient is in today for f/u elbow and kidney function.  Discussed the use of AI scribe software for clinical note transcription with the patient, who gave verbal consent to proceed.  History of Present Illness [name] is an 89 year old female who presents with elbow pain and concerns about kidney function.  She experiences significant elbow pain without a known cause, with no recent trauma or falls. The pain is severe, occurs without a specific pattern, and sometimes worsens with the use of her walker or during sleep. She primarily sleeps on her side. Extra strength Tylenol  provides intermittent relief. A previous short course of prednisone  helped temporarily, but the pain returned after completing the medication.  She has a history of kidney function abnormalities, which have worsened over the past month. She is unsure of her exact fluid intake but believes she drinks more than six glasses of water daily. No issues with urination are reported. Her medication regimen was adjusted recently, with a reduction in the frequency of amlodipine  from twice daily to once at night.    Past Medical History:  Diagnosis Date   Allergic rhinitis    Anemia    Anxiety    Barrett esophagus    CAD (coronary artery disease) 2009   a. Multivessel s/p PCI w/DES 2009 // b. s/p CABG 2011  //  c. LHC 8/15: pLAD 95 ISR, LCx 100, pOM1 40, dRCA 100, S-OM1/OM2 ok, S-D1 ok, S-PDA ok, L-LAD ok, EF 60%   Carotid artery disease (HCC)    a. Carotid US  9/15: RICA 1-39%; LICA 40-59% >> FU 1 year  //  b. Carotid US  9/17: R 1-39%, L 40-59% >> FU 1 year   Chronic diastolic heart failure (HCC)    CKD (chronic kidney disease), stage II    GFR 60-89 ml/min   Depression    Disc disease, degenerative, cervical     Diverticulosis    Gastroparesis    GERD (gastroesophageal reflux disease)    Gout    H/O hiatal hernia    Helicobacter pylori gastritis    History of echocardiogram    a. Echo 11/13: EF 55% to 60%. Grade 2 diastolic dysfunction, MAC, trivial MR, mild LAE, normal RVSF, mild RAE, PASP 39 mmHg  //  b. 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   Type II or unspecified type diabetes mellitus without mention of complication, not stated as uncontrolled     Past Surgical History:  Procedure Laterality Date   ABDOMINAL HYSTERECTOMY     CARDIAC CATHETERIZATION     2011  DR COOPER (APPT NEXT WEEK)   CHOLECYSTECTOMY     CORONARY ARTERY BYPASS GRAFT  2011   LIMA-LAD, SVG-DIAG, SVG-OM1-OM2, SVG-PDA  CORONARY STENT PLACEMENT     Drug-eluting stent to the left anterior descending, circumflex and right coronary artery in Jan 2009   EYE SURGERY     BIL CATARACT REMOVAL 06/2010   LEFT HEART CATHETERIZATION WITH CORONARY ANGIOGRAM N/A 12/02/2013   Procedure: LEFT HEART CATHETERIZATION WITH CORONARY ANGIOGRAM;  Surgeon: Victory LELON Claudene DOUGLAS, MD;  Location: [facility] CATH LAB;  Service: Cardiovascular;  Laterality: N/A;   OVARIAN CYST REMOVAL     PACEMAKER INSERTION  03/06/12   MDT Adapta L implanted by Dr Kelsie for SSS   PERMANENT PACEMAKER INSERTION N/A 03/06/2012   Procedure: PERMANENT PACEMAKER INSERTION;  Surgeon: Lynwood Kelsie, MD;  Location: [facility] C[facility] LAB;  Service: Cardiovascular;  Laterality: N/A;   SHOULDER ARTHROSCOPY  06/16/2011   Procedure: ARTHROSCOPY SHOULDER;   Surgeon: Rome JULIANNA Pepper, MD;  Location: [facility] OR;  Service: Orthopedics;  Laterality: Left;  LEFT SHOULDER ARTHROSCOPY ACROMIALPLASTY, POSSIBLE MINI OPEN CUFF REPAIR    TUBAL LIGATION      Family History  Problem Relation Age of Onset   Diabetes Mother    Hypertension Mother    Heart attack Mother    Stroke Father    Cancer Brother    Deafness Daughter    Other Son        cerebral edema at 18 months   Coronary artery disease Other     Social History   Socioeconomic History   Marital status: Widowed    Spouse name: Not on file   Number of children: Not on file   Years of education: Not on file   Highest education level: Not on file  Occupational History   Occupation: RETIRED LPN    Comment: worked as [employer]  Tobacco Use   Smoking status: Former    Current packs/day: 0.00    Average packs/day: 0.3 packs/day for 28.0 years (7.0 ttl pk-yrs)    Types: Cigarettes    Start date: 74    Quit date: 66    Years since quitting: 31.6   Smokeless tobacco: Never   Tobacco comments:    quit 30 yrs ago  Vaping Use   Vaping status: Never Used  Substance and Sexual Activity   Alcohol use: No   Drug use: No   Sexual activity: Not on file  Other Topics Concern   Not on file  Social History Narrative   Widowed 2004.., Lives with daughter and grand son ,Family history is negative for premature coronary artery disease. Mother died at age 16 with heart disease in her later years, father died at age 53 from a stroke.SABRAShe  has 8 siblings, none of whom have coronary artery disease.   Social Drivers of Corporate investment banker Strain: Low Risk  (10/31/2023)   Overall Financial Resource Strain (CARDIA)    Difficulty of Paying Living Expenses: Not hard at all  Food Insecurity: No Food Insecurity (10/31/2023)   Hunger Vital Sign    Worried About Running Out of Food in the Last Year: Never true    Ran Out of Food in the Last Year: Never true  Transportation Needs: No Transportation Needs  (10/31/2023)   PRAPARE - Administrator, Civil Service (Medical): No    Lack of Transportation (Non-Medical): No  Physical Activity: Insufficiently Active (11/02/2022)   Exercise Vital Sign    Days of Exercise per Week: 5 days    Minutes of Exercise per Session: 20 min  Stress: No Stress Concern Present (11/02/2022)   [place]  [employer] - Occupational Stress Questionnaire    Feeling of Stress : Only a little  Social Connections: Moderately Isolated (08/28/2023)   Social Connection and Isolation Panel    Frequency of Communication with Friends and Family: More than three times a week    Frequency of Social Gatherings with Friends and Family: Once a week    Attends Religious Services: More than 4 times per year    Active Member of [employer] or Organizations: No    Attends Banker Meetings: Never    Marital Status: Widowed  Intimate Partner Violence: Not At Risk (10/31/2023)   Humiliation, Afraid, Rape, and Kick questionnaire    Fear of Current or Ex-Partner: No    Emotionally Abused: No    Physically Abused: No    Sexually Abused: No    Outpatient Medications Prior to Visit  Medication Sig Dispense Refill   ACCU-CHEK GUIDE test strip CHECK GLUCOSE IN THE MORNING AT  NOON AND AT BEDTIME 100 strip 9   Accu-Chek Softclix Lancets lancets CHECK GLUCOSE IN THE MORNING AT  NOON AND AT BEDTIME 100 each 9   acetaminophen  (TYLENOL ) 500 MG tablet Take 500 mg by mouth every 6 (six) hours as needed for headache (pain).     albuterol  (VENTOLIN  HFA) 108 (90 Base) MCG/ACT inhaler Inhale 2 puffs into the lungs every 6 (six) hours as needed for wheezing or shortness of breath. 8 g 2   allopurinol  (ZYLOPRIM ) 100 MG tablet Take 1 tablet (100 mg total) by mouth at bedtime. 90 tablet 3   amLODipine  (NORVASC ) 5 MG tablet Take 1 tablet (5 mg total) by mouth at bedtime. 100 each 3   atorvastatin  (LIPITOR) 20 MG tablet TAKE 1 TABLET BY MOUTH ONCE  DAILY 100 tablet 2    azelastine (OPTIVAR) 0.05 % ophthalmic solution Place 1 drop into both eyes 2 (two) times daily.     carvedilol  (COREG ) 12.5 MG tablet TAKE 1 TABLET BY MOUTH TWICE  DAILY 200 tablet 0   clopidogrel  (PLAVIX ) 75 MG tablet TAKE 1 TABLET BY MOUTH DAILY 100 tablet 0   ENTRESTO  97-103 MG TAKE 1 TABLET BY MOUTH TWICE  DAILY 200 tablet 2   furosemide  (LASIX ) 40 MG tablet Take 1 tablet (40 mg total) by mouth daily. 90 tablet 3   ketoconazole  (NIZORAL ) 2 % cream Apply 1 Application topically daily. 30 g 0   methocarbamol  (ROBAXIN ) 500 MG tablet Take 1 tablet (500 mg total) by mouth every 6 (six) hours as needed for muscle spasms. 120 tablet 0   Multiple Vitamins-Minerals (ONE A DAY WOMEN 50 PLUS) TABS Take 1 tablet by mouth daily.     mupirocin  ointment (BACTROBAN ) 2 % Apply 1 Application topically daily as needed. 22 g 0   nitroGLYCERIN  (NITROSTAT ) 0.4 MG SL tablet DISSOLVE ONE TABLET UNDER THE TONGUE EVERY 5 MINUTES AS NEEDED FOR CHEST PAIN.  DO NOT EXCEED A TOTAL OF 3 DOSES IN 15 MINUTES 25 tablet 3   nystatin  powder Apply 1 Application topically 3 (three) times daily. 15 g 0   pantoprazole  (PROTONIX ) 40 MG tablet Take 1 tablet (40 mg total) by mouth daily. 90 tablet 3   potassium chloride  (KLOR-CON ) 10 MEQ tablet Take 1 tablet (10 mEq total) by mouth daily. 90 tablet 3   nitrofurantoin , macrocrystal-monohydrate, (MACROBID ) 100 MG capsule Take 1 capsule (100 mg total) by mouth 2 (two) times daily. 14 capsule 0   predniSONE  (DELTASONE ) 20 MG tablet Take 1 tablet (20 mg  total) by mouth daily with breakfast. 5 tablet 0   No facility-administered medications prior to visit.    Allergies  Allergen Reactions   Codeine Other (See Comments)    Hallucinations   Hydrocodone Other (See Comments)    Made her pass out   Penicillins Hives   Shellfish Allergy Hives   Sulfonamide Derivatives Nausea And Vomiting   Cipro  [Ciprofloxacin  Hcl] Nausea And Vomiting and Other (See Comments)    Syncope    Morphine  And  Codeine Other (See Comments)    Hallucinations Went crazy   Tiazac [Diltiazem] Other (See Comments)    Bradycardia    Glucophage  [Metformin ] Diarrhea   Mevacor [Lovastatin] Other (See Comments)    Unknown reaction    Review of Systems  Constitutional:  Negative for fever and malaise/fatigue.  HENT:  Negative for congestion.   Eyes:  Negative for blurred vision.  Respiratory:  Negative for cough and shortness of breath.   Cardiovascular:  Negative for chest pain, palpitations and leg swelling.  Gastrointestinal:  Negative for vomiting.  Musculoskeletal:  Positive for joint pain. Negative for back pain and falls.  Skin:  Negative for rash.  Neurological:  Negative for loss of consciousness and headaches.       Objective:    Physical Exam Vitals and nursing note reviewed.  Constitutional:      General: She is not in acute distress.    Appearance: Normal appearance. She is well-developed.  HENT:     Head: Normocephalic and atraumatic.  Eyes:     General: No scleral icterus.       Right eye: No discharge.        Left eye: No discharge.  Cardiovascular:     Rate and Rhythm: Normal rate and regular rhythm.     Heart sounds: No murmur heard. Pulmonary:     Effort: Pulmonary effort is normal. No respiratory distress.     Breath sounds: Normal breath sounds.  Musculoskeletal:        General: Normal range of motion.     Right elbow: Tenderness present.       Arms:     Cervical back: Normal range of motion and neck supple.     Right lower leg: No edema.     Left lower leg: No edema.  Skin:    General: Skin is warm and dry.  Neurological:     Mental Status: She is alert and oriented to person, place, and time.  Psychiatric:        Mood and Affect: Mood normal.        Behavior: Behavior normal.        Thought Content: Thought content normal.        Judgment: Judgment normal.     BP 132/70 (BP Location: Right Arm, Patient Position: Sitting, Cuff Size: Large)   Pulse  61   Temp 98.1 F (36.7 C) (Oral)   Resp 18   Ht 5' 7 (1.702 m)   Wt 180 lb (81.6 kg)   SpO2 100%   BMI 28.19 kg/m  Wt Readings from Last 3 Encounters:  12/15/23 180 lb (81.6 kg)  11/28/23 183 lb 12.8 oz (83.4 kg)  11/17/23 175 lb (79.4 kg)    Diabetic Foot Exam - Simple   No data filed    Lab Results  Component Value Date   WBC 7.3 11/17/2023   HGB 11.8 (L) 11/17/2023   HCT 36.5 11/17/2023   PLT 177 11/17/2023   GLUCOSE 148 (  H) 11/28/2023   CHOL 131 09/05/2023   TRIG 121.0 09/05/2023   HDL 67.40 09/05/2023   LDLDIRECT 82.5 09/11/2009   LDLCALC 39 09/05/2023   ALT 13 11/28/2023   AST 16 11/28/2023   NA 142 11/28/2023   K 4.1 11/28/2023   CL 105 11/28/2023   CREATININE 1.82 (H) 11/28/2023   BUN 36 (H) 11/28/2023   CO2 29 11/28/2023   TSH 4.78 03/31/2022   INR 1.1 08/29/2023   HGBA1C 7.4 (H) 09/05/2023   MICROALBUR 6.6 (H) 09/05/2023    Lab Results  Component Value Date   TSH 4.78 03/31/2022   Lab Results  Component Value Date   WBC 7.3 11/17/2023   HGB 11.8 (L) 11/17/2023   HCT 36.5 11/17/2023   MCV 95.3 11/17/2023   PLT 177 11/17/2023   Lab Results  Component Value Date   NA 142 11/28/2023   K 4.1 11/28/2023   CO2 29 11/28/2023   GLUCOSE 148 (H) 11/28/2023   BUN 36 (H) 11/28/2023   CREATININE 1.82 (H) 11/28/2023   BILITOT 0.5 11/28/2023   ALKPHOS 81 11/28/2023   AST 16 11/28/2023   ALT 13 11/28/2023   PROT 6.3 11/28/2023   ALBUMIN 3.7 11/28/2023   CALCIUM  8.3 (L) 11/28/2023   ANIONGAP 11 11/17/2023   EGFR 25 (L) 11/24/2023   GFR 24.42 (L) 11/28/2023   Lab Results  Component Value Date   CHOL 131 09/05/2023   Lab Results  Component Value Date   HDL 67.40 09/05/2023   Lab Results  Component Value Date   LDLCALC 39 09/05/2023   Lab Results  Component Value Date   TRIG 121.0 09/05/2023   Lab Results  Component Value Date   CHOLHDL 2 09/05/2023   Lab Results  Component Value Date   HGBA1C 7.4 (H) 09/05/2023        Assessment & Plan:  Right elbow pain Assessment & Plan: Pt has app with ortho in next 7-10 days   Orders: -     predniSONE ; TAKE 3 TABLETS PO QD FOR 3 DAYS THEN TAKE 2 TABLETS PO QD FOR 3 DAYS THEN TAKE 1 TABLET PO QD FOR 3 DAYS THEN TAKE 1/2 TAB PO QD FOR 3 DAYS  Dispense: 20 tablet; Refill: 0  Need for influenza vaccination -     Flu vaccine HIGH DOSE PF(Fluzone Trivalent)  Stage 3b chronic kidney disease (HCC) -     Ambulatory referral to Nephrology -     Comprehensive metabolic panel with GFR; Future  Chronic renal failure, stage 3b (HCC)   Assessment and Plan Assessment & Plan Right elbow pain   Chronic right elbow pain occurs intermittently at night or with walker use, without specific injury. Previous prednisone  treatment provided temporary relief. No swelling is present. Prescribe prednisone  for pain relief and follow up with an orthopedic specialist on September 19th.  Chronic kidney disease, stage 3b   Kidney function has worsened over the past month with a significant increase in creatinine levels. Fluid intake is adequate, and there are no urinary symptoms. Amlodipine  was recently reduced to once daily. Further evaluation is needed to determine the cause of deterioration, possibly medication-related. Ensure referral to a kidney specialist is completed and check kidney function in two weeks.  General Health Maintenance   She received a flu shot during the visit.   Hardeep Reetz R Lowne Chase, DO

## 2023-12-26 ENCOUNTER — Other Ambulatory Visit: Payer: Self-pay | Admitting: Family Medicine

## 2023-12-26 ENCOUNTER — Ambulatory Visit: Admitting: *Deleted

## 2023-12-26 ENCOUNTER — Telehealth: Payer: Self-pay | Admitting: *Deleted

## 2023-12-26 VITALS — Ht 67.0 in | Wt 180.0 lb

## 2023-12-26 DIAGNOSIS — E11620 Type 2 diabetes mellitus with diabetic dermatitis: Secondary | ICD-10-CM

## 2023-12-26 DIAGNOSIS — M48061 Spinal stenosis, lumbar region without neurogenic claudication: Secondary | ICD-10-CM

## 2023-12-26 DIAGNOSIS — Z0000 Encounter for general adult medical examination without abnormal findings: Secondary | ICD-10-CM | POA: Diagnosis not present

## 2023-12-26 DIAGNOSIS — I10 Essential (primary) hypertension: Secondary | ICD-10-CM

## 2023-12-26 DIAGNOSIS — N1832 Chronic kidney disease, stage 3b: Secondary | ICD-10-CM

## 2023-12-26 DIAGNOSIS — G20C Parkinsonism, unspecified: Secondary | ICD-10-CM

## 2023-12-26 DIAGNOSIS — E785 Hyperlipidemia, unspecified: Secondary | ICD-10-CM

## 2023-12-26 DIAGNOSIS — I5032 Chronic diastolic (congestive) heart failure: Secondary | ICD-10-CM

## 2023-12-26 NOTE — Progress Notes (Cosign Needed)
 Subjective:   [name] is a 89 y.o. who presents for a Medicare Wellness preventive visit.  As a reminder, Annual Wellness Visits don't include a physical exam, and some assessments may be limited, especially if this visit is performed virtually. We may recommend an in-person follow-up visit with your provider if needed.  Visit Complete: Virtual I connected with  [name] on 12/26/23 by a audio enabled telemedicine application and verified that I am speaking with the correct person using two identifiers.  Patient Location: Home  Provider Location: Office/Clinic  I discussed the limitations of evaluation and management by telemedicine. The patient expressed understanding and agreed to proceed.  Vital Signs: Because this visit was a virtual/telehealth visit, some criteria may be missing or patient reported. Any vitals not documented were not able to be obtained and vitals that have been documented are patient reported.  VideoDeclined- This patient declined Librarian, academic. Therefore the visit was completed with audio only.  Persons Participating in Visit: Patient.  AWV Questionnaire: No: Patient Medicare AWV questionnaire was not completed prior to this visit.  Cardiac Risk Factors include: advanced age (>46men, >19 women);hypertension;diabetes mellitus;dyslipidemia;Other (see comment), Risk factor comments: CAD, CKD     Objective:    Today's Vitals   12/26/23 1104  Weight: 180 lb (81.6 kg)  Height: 5' 7 (1.702 m)   Body mass index is 28.19 kg/m.     12/26/2023   11:16 AM 11/17/2023    8:22 AM 08/28/2023    5:00 PM 08/28/2023   10:34 AM 11/02/2022   10:49 AM 10/11/2022    9:40 AM 09/01/2022   10:59 AM  Advanced Directives  Does Patient Have a Medical Advance Directive? Yes No Yes Yes Yes Yes No  Type of Corporate investment banker Power of [employer] Power of  [place];Living will Living will   Does patient want to make changes to medical advance directive? No - Patient declined  No - Patient declined  No - Patient declined    Copy of Healthcare Power of Attorney in Chart? No - copy requested  No - copy requested No - copy requested, Physician notified No - copy requested    Would patient like information on creating a medical advance directive?  No - Patient declined     No - Patient declined    Current Medications (verified) Outpatient Encounter Medications as of 12/26/2023  Medication Sig   acetaminophen  (TYLENOL ) 500 MG tablet Take 500 mg by mouth every 6 (six) hours as needed for headache (pain).   albuterol  (VENTOLIN  HFA) 108 (90 Base) MCG/ACT inhaler Inhale 2 puffs into the lungs every 6 (six) hours as needed for wheezing or shortness of breath.   allopurinol  (ZYLOPRIM ) 100 MG tablet Take 1 tablet (100 mg total) by mouth at bedtime.   amLODipine  (NORVASC ) 5 MG tablet Take 1 tablet (5 mg total) by mouth at bedtime.   atorvastatin  (LIPITOR) 20 MG tablet TAKE 1 TABLET BY MOUTH ONCE  DAILY   azelastine (OPTIVAR) 0.05 % ophthalmic solution Place 1 drop into both eyes 2 (two) times daily.   carvedilol  (COREG ) 12.5 MG tablet TAKE 1 TABLET BY MOUTH TWICE  DAILY   clopidogrel  (PLAVIX ) 75 MG tablet TAKE 1 TABLET BY MOUTH DAILY   ENTRESTO  97-103 MG TAKE 1 TABLET BY MOUTH TWICE  DAILY   furosemide  (LASIX ) 40 MG tablet Take 1 tablet (40 mg total) by mouth daily.  ketoconazole  (NIZORAL ) 2 % cream Apply 1 Application topically daily.   methocarbamol  (ROBAXIN ) 500 MG tablet Take 1 tablet (500 mg total) by mouth every 6 (six) hours as needed for muscle spasms.   Multiple Vitamins-Minerals (ONE A DAY WOMEN 50 PLUS) TABS Take 1 tablet by mouth daily.   mupirocin  ointment (BACTROBAN ) 2 % Apply 1 Application topically daily as needed.   nitroGLYCERIN  (NITROSTAT ) 0.4 MG SL tablet DISSOLVE ONE TABLET UNDER THE TONGUE EVERY 5 MINUTES AS NEEDED FOR CHEST PAIN.  DO  NOT EXCEED A TOTAL OF 3 DOSES IN 15 MINUTES   pantoprazole  (PROTONIX ) 40 MG tablet Take 1 tablet (40 mg total) by mouth daily.   potassium chloride  (KLOR-CON ) 10 MEQ tablet Take 1 tablet (10 mEq total) by mouth daily.   predniSONE  (DELTASONE ) 10 MG tablet TAKE 3 TABLETS PO QD FOR 3 DAYS THEN TAKE 2 TABLETS PO QD FOR 3 DAYS THEN TAKE 1 TABLET PO QD FOR 3 DAYS THEN TAKE 1/2 TAB PO QD FOR 3 DAYS   ACCU-CHEK GUIDE test strip CHECK GLUCOSE IN THE MORNING AT  NOON AND AT BEDTIME (Patient not taking: Reported on 12/26/2023)   Accu-Chek Softclix Lancets lancets CHECK GLUCOSE IN THE MORNING AT  NOON AND AT BEDTIME (Patient not taking: Reported on 12/26/2023)   [DISCONTINUED] nystatin  powder Apply 1 Application topically 3 (three) times daily. (Patient not taking: Reported on 12/26/2023)   No facility-administered encounter medications on file as of 12/26/2023.    Allergies (verified) Codeine, Hydrocodone, Penicillins, Shellfish allergy, Sulfonamide derivatives, Cipro  [ciprofloxacin  hcl], Morphine  and codeine, Tiazac [diltiazem], Glucophage  [metformin ], and Mevacor [lovastatin]   History: Past Medical History:  Diagnosis Date   Allergic rhinitis    Anemia    Anxiety    Barrett esophagus    CAD (coronary artery disease) 2009   a. Multivessel s/p PCI w/DES 2009 // b. s/p CABG 2011  //  c. LHC 8/15: pLAD 95 ISR, LCx 100, pOM1 40, dRCA 100, S-OM1/OM2 ok, S-D1 ok, S-PDA ok, L-LAD ok, EF 60%   Carotid artery disease (HCC)    a. Carotid US  9/15: RICA 1-39%; LICA 40-59% >> FU 1 year  //  b. Carotid US  9/17: R 1-39%, L 40-59% >> FU 1 year   Chronic diastolic heart failure (HCC)    CKD (chronic kidney disease), stage II    GFR 60-89 ml/min   Depression    Disc disease, degenerative, cervical    Diverticulosis    Gastroparesis    GERD (gastroesophageal reflux disease)    Gout    H/O hiatal hernia    Helicobacter pylori gastritis    History of echocardiogram    a. Echo 11/13: EF 55% to 60%. Grade 2  diastolic dysfunction, MAC, trivial MR, mild LAE, normal RVSF, mild RAE, PASP 39 mmHg  //  b. 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   Type II or unspecified type diabetes mellitus without mention of complication, not stated as uncontrolled  Past Surgical History:  Procedure Laterality Date   ABDOMINAL HYSTERECTOMY     CARDIAC CATHETERIZATION     2011  DR COOPER (APPT NEXT WEEK)   CHOLECYSTECTOMY     CORONARY ARTERY BYPASS GRAFT  2011   LIMA-LAD, SVG-DIAG, SVG-OM1-OM2, SVG-PDA   CORONARY STENT PLACEMENT     Drug-eluting stent to the left anterior descending, circumflex and right coronary artery in Jan 2009   EYE SURGERY     BIL CATARACT REMOVAL 06/2010   LEFT HEART CATHETERIZATION WITH CORONARY ANGIOGRAM N/A 12/02/2013   Procedure: LEFT HEART CATHETERIZATION WITH CORONARY ANGIOGRAM;  Surgeon: Victory LELON Claudene DOUGLAS, MD;  Location[facility] C[facility] LAB;  Service: Cardiovascular;  Laterality: N/A;   OVARIAN CYST REMOVAL     PACEMAKER INSERTION  03/06/12   MDT Adapta L implanted by Dr Kelsie for SSS   PERMANENT PACEMAKER INSERTION N/A 03/06/2012   Procedure: PERMANENT PACEMAKER INSERTION;  Surgeon: Lynwood Kelsie, MD;  Location: [facility] CATH LAB;  Service: Cardiovascular;  Laterality: N/A;   SHOULDER ARTHROSCOPY  06/16/2011   Procedure: ARTHROSCOPY SHOULDER;  Surgeon: Rome JULIANNA Pepper, MD;  Location: [facility] OR;  Service: Orthopedics;  Laterality: Left;  LEFT SHOULDER ARTHROSCOPY ACROMIALPLASTY, POSSIBLE MINI OPEN CUFF REPAIR    TUBAL LIGATION     Family History  Problem Relation  Age of Onset   Diabetes Mother    Hypertension Mother    Heart attack Mother    Stroke Father    Cancer Brother    Deafness Daughter    Other Son        cerebral edema at 18 months   Coronary artery disease Other    Social History   Socioeconomic History   Marital status: Widowed    Spouse name: Not on file   Number of children: Not on file   Years of education: Not on file   Highest education level: Not on file  Occupational History   Occupation: RETIRED LPN    Comment: worked as [employer]  Tobacco Use   Smoking status: Former    Current packs/day: 0.00    Average packs/day: 0.3 packs/day for 28.0 years (7.0 ttl pk-yrs)    Types: Cigarettes    Start date: 36    Quit date: 77    Years since quitting: 31.7   Smokeless tobacco: Never   Tobacco comments:    quit 30 yrs ago  Vaping Use   Vaping status: Never Used  Substance and Sexual Activity   Alcohol use: No   Drug use: No   Sexual activity: Not on file  Other Topics Concern   Not on file  Social History Narrative   Widowed 2004.., Lives with daughter and grand son ,Family history is negative for premature coronary artery disease. Mother died at age 17 with heart disease in her later years, father died at age 76 from a stroke.SABRAShe  has 8 siblings, none of whom have coronary artery disease.   Social Drivers of Corporate investment banker Strain: Low Risk  (12/26/2023)   Overall Financial Resource Strain (CARDIA)    Difficulty of Paying Living Expenses: Not very hard  Food Insecurity: No Food Insecurity (12/26/2023)   Hunger Vital Sign    Worried About Running Out of Food in the Last Year: Never true    Ran Out of Food in the Last Year: Never true  Transportation Needs: No Transportation Needs (12/26/2023)   PRAPARE - Administrator, Civil Service (Medical): No    Lack  of Transportation (Non-Medical): No  Physical Activity: Insufficiently Active (12/26/2023)   Exercise Vital Sign    Days of Exercise per  Week: 2 days    Minutes of Exercise per Session: 10 min  Stress: No Stress Concern Present (12/26/2023)   [employer] of Occupational Health - Occupational Stress Questionnaire    Feeling of Stress: Only a little  Social Connections: Moderately Isolated (12/26/2023)   Social Connection and Isolation Panel    Frequency of Communication with Friends and Family: More than three times a week    Frequency of Social Gatherings with Friends and Family: Once a week    Attends Religious Services: More than 4 times per year    Active Member of [employer] or Organizations: No    Attends Banker Meetings: Never    Marital Status: Widowed    Tobacco Counseling Counseling given: Not Answered Tobacco comments: quit 30 yrs ago    Clinical Intake:  Pre-visit preparation completed: Yes        BMI - recorded: 28.19 Nutritional Status: BMI 25 -29 Overweight Nutritional Risks: None Diabetes: Yes CBG done?: No Did pt. bring in CBG monitor from home?: No  Lab Results  Component Value Date   HGBA1C 7.4 (H) 09/05/2023   HGBA1C 7.0 (H) 04/17/2023   HGBA1C 7.2 (H) 10/14/2022     How often do you need to have someone help you when you read instructions, pamphlets, or other written materials from your doctor or pharmacy?: 1 - Never  Interpreter Needed?: No  Information entered by :: Lolita Libra, CMA(AAMA)   Activities of Daily Living     12/26/2023   11:12 AM 08/28/2023    5:02 PM  In your present state of health, do you have any difficulty performing the following activities:  Hearing? 1   Vision? 0   Difficulty concentrating or making decisions? 0   Walking or climbing stairs? 1   Dressing or bathing? 1   Doing errands, shopping? 1 0  Preparing Food and eating ? Y   Using the Toilet? N   In the past six months, have you accidently leaked urine? N   Do you have problems with loss of bowel control? N   Managing your Medications? Y   Managing your Finances? Y    Housekeeping or managing your Housekeeping? Y     Patient Care Team: Antonio Meth, [name], DO as PCP - General (Family Medicine) Wonda Sharper, MD as PCP - Cardiology (Cardiology) Lelon Glendia ONEIDA DEVONNA as Physician Assistant (Cardiology) Jarold Mayo, MD as Consulting Physician (Ophthalmology) Ramonita Suzen CROME, RN as VBCI Care Management  I have updated your Care Teams any recent Medical Services you may have received from other providers in the past year.     Assessment:   This is a routine wellness examination for [place].  Hearing/Vision screen Hearing Screening - Comments:: Hearing aids Vision Screening - Comments:: Up to date with routine eye exams with [facility]    Goals Addressed   None    Depression Screen     11/28/2023   11:35 AM 01/02/2023    1:05 PM 11/02/2022   10:46 AM 07/05/2022   10:35 AM 08/12/2021    2:14 PM 05/04/2021   11:23 AM 02/01/2021   12:17 PM  PHQ 2/9 Scores  PHQ - 2 Score 1 0 0 1 1 0 0  PHQ- 9 Score 7  0      Pt reports no change since 11/28/23  Fall Risk     11/28/2023   11:34 AM 11/24/2023   10:51 AM 11/24/2023   10:50 AM 08/08/2023   10:50 AM 07/28/2023   11:47 AM  Fall Risk   Falls in the past year? 0  0 -- 0  Comment    patient reports no new falls since last assessment   Number falls in past yr: 0  0    Injury with Fall? 0  0    Risk for fall due to : Impaired mobility;Impaired balance/gait Impaired mobility;Impaired balance/gait   Impaired balance/gait;Impaired mobility  Follow up Falls evaluation completed  Falls evaluation completed    Pt reports no change since 11/28/23.  MEDICARE RISK AT HOME:     TIMED UP AND GO:  Was the test performed?  No,audio  Cognitive Function: 6CIT completed        12/26/2023   11:25 AM 11/02/2022   10:51 AM  6CIT Screen  What Year? 0 points 4 points  What month? 0 points 0 points  What time? 0 points 0 points  Count back from 20 0 points 0 points  Months in reverse  0 points 4 points  Repeat phrase 4 points 0 points  Total Score 4 points 8 points    Immunizations Immunization History  Administered Date(s) Administered   Fluad  Quad(high Dose 65+) 02/22/2022   Fluad  Trivalent(High Dose 65+) 12/30/2016   INFLUENZA, HIGH DOSE SEASONAL PF 01/08/2019, 12/15/2023   Influenza Split 01/19/2013   Influenza Whole 01/24/2006, 02/16/2007, 01/14/2008, 03/20/2009, 01/21/2010   Influenza-Unspecified 03/12/2011   PFIZER(Purple Top)SARS-COV-2 Vaccination 06/02/2019, 06/25/2019, 01/27/2020   PNEUMOCOCCAL CONJUGATE-20 08/22/2022   Pfizer Covid-19 Vaccine Bivalent Booster 32yrs & up 03/16/2021   Pfizer(Comirnaty )Fall Seasonal Vaccine 12 years and older 02/22/2022   Pneumococcal Polysaccharide-23 01/13/2003, 09/09/2008   Td 09/09/2008    Screening Tests Health Maintenance  Topic Date Due   Zoster Vaccines- Shingrix (1 of 2) Never done   DTaP/Tdap/Td (2 - Tdap) 09/10/2018   FOOT EXAM  02/16/2023   OPHTHALMOLOGY EXAM  05/05/2023   Medicare Annual Wellness (AWV)  11/02/2023   COVID-19 Vaccine (6 - 2025-26 season) 12/11/2023   HEMOGLOBIN A1C  03/07/2024   Pneumococcal Vaccine: 50+ Years  Completed   Influenza Vaccine  Completed   DEXA SCAN  Completed   HPV VACCINES  Aged Out   Meningococcal B Vaccine  Aged Out    Health Maintenance Items Addressed: Will get tetanus and shingles vaccine at pharmacy. Has eye exam scheduled for 01/04/24. Due for foot exam at next OV  Additional Screening:  Vision Screening: Recommended annual ophthalmology exams for early detection of glaucoma and other disorders of the eye. Is the patient up to date with their annual eye exam?  NO, has appt on 01/04/24. Who is the provider or what is the name of the office in which the patient attends annual eye exams? [facility]  Dental Screening: Recommended annual dental exams for proper oral hygiene  Community Resource Referral / Chronic Care Management: CRR required  this visit?  No   CCM required this visit?  No   Plan:    I have personally reviewed and noted the following in the patient's chart:   Medical and social history Use of alcohol, tobacco or illicit drugs  Current medications and supplements including opioid prescriptions. Patient is not currently taking opioid prescriptions. Functional ability and status Nutritional status Physical activity Advanced directives List of other physicians Hospitalizations, surgeries, and ER visits in previous 12 months  Vitals Screenings to include cognitive, depression, and falls Referrals and appointments  In addition, I have reviewed and discussed with patient certain preventive protocols, quality metrics, and best practice recommendations. A written personalized care plan for preventive services as well as general preventive health recommendations were provided to patient.   Lolita Libra, CMA   12/26/2023   After Visit Summary: (Mail) Due to this being a telephonic visit, the after visit summary with patients personalized plan was offered to patient via mail   Notes: see phone note

## 2023-12-26 NOTE — Telephone Encounter (Signed)
 Heather:  Pt had AWV today. She needs assistance in the home with her ADLs and PT.  She states referral was placed but no one has contacted her.  I see a referral placed in January this year and notes said that she was established with [facility].  Can you find out who is supposed to be seeing pt and see why no one has reached out to her?

## 2023-12-26 NOTE — Patient Instructions (Addendum)
 Ms. [name] , Thank you for taking time out of your busy schedule to complete your Annual Wellness Visit with me. I enjoyed our conversation and look forward to speaking with you again next year. I, as well as your care team,  appreciate your ongoing commitment to your health goals. Please review the following plan we discussed and let me know if I can assist you in the future. Your Game plan/ To Do List   Referrals: If you haven't heard from the office you've been referred to, please reach out to them at the phone provided.   [facility]: [phone]  Follow up Visits: Next Medicare AWV with our clinical staff: 01/01/25 11am, telephone.  Next Office Visit with your provider: 04/08/24 10am, Dr Antonio Meth  Clinician Recommendations:  Aim for 30 minutes of exercise or brisk walking, 6-8 glasses of water, and 5 servings of fruits and vegetables each day.   You will need to get the following vaccines at your local pharmacy: tetanus and shingles      This is a list of the screening recommended for you and due dates:  Health Maintenance  Topic Date Due   Zoster (Shingles) Vaccine (1 of 2) Never done   DTaP/Tdap/Td vaccine (2 - Tdap) 09/10/2018   Complete foot exam   02/16/2023   Eye exam for diabetics  05/05/2023   Medicare Annual Wellness Visit  11/02/2023   COVID-19 Vaccine (6 - 2025-26 season) 12/11/2023   Hemoglobin A1C  03/07/2024   Pneumococcal Vaccine for age over 46  Completed   Flu Shot  Completed   DEXA scan (bone density measurement)  Completed   HPV Vaccine  Aged Out   Meningitis B Vaccine  Aged Out    Advanced directives: (Copy Requested) Please bring a copy of your health care power of attorney and living will to the office to be added to your chart at your convenience. You can mail to [facility] [address] [place], [place] [address] or email to [email or web address] Advance Care Planning is important because it:  [x]  Makes sure you receive  the medical care that is consistent with your values, goals, and preferences  [x]  It provides guidance to your family and loved ones and reduces their decisional burden about whether or not they are making the right decisions based on your wishes.  Follow the link provided in your after visit summary or read over the paperwork we have mailed to you to help you started getting your Advance Directives in place. If you need assistance in completing these, please reach out to us  so that we can help you!  See attachments for Preventive Care and Fall Prevention Tips.

## 2023-12-27 NOTE — Telephone Encounter (Signed)
 AWV completed 12/27/23.

## 2023-12-28 ENCOUNTER — Other Ambulatory Visit: Payer: Self-pay | Admitting: Cardiovascular Disease

## 2023-12-28 DIAGNOSIS — I5032 Chronic diastolic (congestive) heart failure: Secondary | ICD-10-CM

## 2023-12-29 ENCOUNTER — Ambulatory Visit (INDEPENDENT_AMBULATORY_CARE_PROVIDER_SITE_OTHER): Admitting: Surgical

## 2023-12-29 ENCOUNTER — Encounter: Payer: Self-pay | Admitting: Surgical

## 2023-12-29 ENCOUNTER — Other Ambulatory Visit

## 2023-12-29 DIAGNOSIS — M19021 Primary osteoarthritis, right elbow: Secondary | ICD-10-CM | POA: Diagnosis not present

## 2023-12-29 NOTE — Progress Notes (Signed)
 Office Visit Note   Patient: [name]           Date of Birth: [birth year]           MRN: [id number] Visit Date: 12/29/2023 Requested by: Jason Leita Repine, FNP [address] [place],  [place] [address] PCP: Antonio Meth, [name], DO  Subjective: Chief Complaint  Patient presents with   Right Elbow - Pain    [name] is a 89 y.o. female who presents to the office reporting right elbow pain.  She is right-hand dominant.  She states that she has had atraumatic onset of right elbow pain over the last 6 weeks that is getting worse.  She has pain worse with any movement of the elbow and when she uses her walker and puts pressure on it.  She denies any swelling.  No numbness or tingling.  Describes pain around the posterior lateral aspect of the elbow with a little bit of radiation down the forearm.  No radicular pain.  No grinding.  No locking.  No history of prior trauma or surgery to the elbow..                ROS: All systems reviewed are negative as they relate to the chief complaint within the history of present illness.  Patient denies fevers or chills.  Assessment & Plan: Visit Diagnoses:  1. Arthritis of right elbow     Plan: Impression is 89 year old female who has right elbow pain that has been ongoing for 6 weeks.  She has radiographs demonstrating right elbow arthritis.  No concerning features on exam.  We discussed options available to patient including continuing with her muscle relaxers and Tylenol  versus adding topical Voltaren  and lidocaine  patches versus trying cortisone injection under ultrasound guidance.  She would like to hold off on any injection for now.  She will try the topical Voltaren  and we will see her back in 4 weeks for clinical recheck with consideration for injection at that time if she has no improvement.  Follow-Up Instructions: No follow-ups on file.   Orders:  No orders of the defined types were placed in this  encounter.  No orders of the defined types were placed in this encounter.     Procedures: No procedures performed   Clinical Data: No additional findings.  Objective: Vital Signs: There were no vitals taken for this visit.  Physical Exam:  Constitutional: Patient appears well-developed HEENT:  Head: Normocephalic Eyes:EOM are normal Neck: Normal range of motion Cardiovascular: Normal rate Pulmonary/chest: Effort normal Neurologic: Patient is alert Skin: Skin is warm Psychiatric: Patient has normal mood and affect  Ortho Exam: Ortho exam demonstrates right elbow with 20 degrees extension and 110 degrees of elbow flexion.  Limited supination and pronation though this is comparable to the contralateral elbow.  Intact EPL, FPL, finger abduction, pronation/supination.  Grip strength intact.  Bicep flexion and tricep extension are intact.  Negative hook test of the distal bicep tendon.  Does have tenderness over the elbow joint.  Specialty Comments:  No specialty comments available.  Imaging: No results found.   PMFS History: Patient Active Problem List   Diagnosis Date Noted   Right elbow pain 12/15/2023   Non-pressure chronic ulcer of other part of left foot with unspecified severity (HCC) 07/01/2023   Parkinsonism, unspecified Parkinsonism type (HCC) 07/01/2023   Tinea corporis 07/01/2023   Dermatitis associated with moisture 03/03/2023   Open wound of skin  03/03/2023   Acute pain of left hip 09/01/2022   Need for pneumococcal 20-valent conjugate vaccination 08/22/2022   Rash 05/27/2022   Tremor 05/27/2022   Coronary artery disease involving native coronary artery of native heart without angina pectoris 09/14/2021   Pain due to onychomycosis of toenails of both feet 01/15/2021   Type 2 diabetes mellitus with diabetic dermatitis, without long-term current use of insulin  (HCC) 01/15/2021   Palpitations 01/15/2018   Cardiac pacemaker in situ 01/15/2018   Heel spur,  left 09/13/2017   Left knee pain 08/23/2017   CKD (chronic kidney disease), stage III (HCC) 12/04/2016   Chest pain 11/23/2015   Type I (juvenile type) diabetes mellitus with renal manifestations, not stated as uncontrolled(250.41) 04/27/2012   Insulin  dependent diabetes mellitus with complications 04/27/2012   AKI (acute kidney injury) (HCC) 03/05/2012   Diarrhea 03/05/2012   Carotid artery disease (HCC) 08/11/2010   Left shoulder pain 07/08/2010   Preventative health care 07/08/2010   HOARSENESS 06/04/2010   Pruritus 05/11/2010   ANEMIA-NOS 04/02/2010   (HFpEF) heart failure with preserved ejection fraction (HCC) 02/11/2010   SINUS BRADYCARDIA 10/30/2009   Allergic rhinitis 09/11/2009   Backache 09/11/2009   MUSCLE STRAIN, RIGHT BUTTOCK 09/11/2009   Herpes zoster 05/11/2009   SHOULDER PAIN, LEFT 12/29/2008   Proteinuria 12/12/2008   Abdominal pain 09/24/2007   Hx of CABG 05/03/2007   CHEST PAIN 04/20/2007   Dizziness 02/28/2007   Anxiety state 02/15/2007   GERD 02/15/2007   Gastroparesis 02/15/2007   DISC DISEASE, CERVICAL 02/15/2007   DISC DISEASE, LUMBAR 02/15/2007   SPINAL STENOSIS, LUMBAR 02/15/2007   PERIPHERAL EDEMA 02/15/2007   Personal History of Other Diseases of Digestive Disease 02/15/2007   Hyperlipidemia LDL goal <70 01/01/2007   GOUT 01/01/2007   Morbid obesity (HCC) 01/01/2007   DEPRESSION 01/01/2007   PERIPHERAL VASCULAR DISEASE 01/01/2007   Diverticulosis of colon 01/01/2007   Osteoarthritis 01/01/2007   LOW BACK PAIN 01/01/2007   Disorder of bone and cartilage 01/01/2007   Essential hypertension 10/26/2006   Past Medical History:  Diagnosis Date   Allergic rhinitis    Anemia    Anxiety    Barrett esophagus    CAD (coronary artery disease) 2009   a. Multivessel s/p PCI w/DES 2009 // b. s/p CABG 2011  //  c. LHC 8/15: pLAD 95 ISR, LCx 100, pOM1 40, dRCA 100, S-OM1/OM2 ok, S-D1 ok, S-PDA ok, L-LAD ok, EF 60%   Carotid artery disease (HCC)    a.  Carotid US  9/15: RICA 1-39%; LICA 40-59% >> FU 1 year  //  b. Carotid US  9/17: R 1-39%, L 40-59% >> FU 1 year   Chronic diastolic heart failure (HCC)    CKD (chronic kidney disease), stage II    GFR 60-89 ml/min   Depression    Disc disease, degenerative, cervical    Diverticulosis    Gastroparesis    GERD (gastroesophageal reflux disease)    Gout    H/O hiatal hernia    Helicobacter pylori gastritis    History of echocardiogram    a. Echo 11/13: EF 55% to 60%. Grade 2 diastolic dysfunction, MAC, trivial MR, mild LAE, normal RVSF, mild RAE, PASP 39 mmHg  //  b. Echo 4/17: EF 55-60%, normal wall motion, trivial AI, MAC, moderate LAE, mild RVE, PASP 35 mmHg   History of thrombocytopenia    HTN (hypertension)    Hyperlipidemia    Hypothyroidism    LBP (low back pain)  Lumbar disc disease/lumbar spinal stenosis   Macular degeneration    Morbid obesity (HCC)    Myocardial infarction (HCC)    Osteoarthritis    Osteopenia    PVD (peripheral vascular disease) (HCC)    Sick sinus syndrome (HCC)    MDT Dual-chamber PPM implant 02/2012   Type II or unspecified type diabetes mellitus without mention of complication, not stated as uncontrolled     Family History  Problem Relation Age of Onset   Diabetes Mother    Hypertension Mother    Heart attack Mother    Stroke Father    Cancer Brother    Deafness Daughter    Other Son        cerebral edema at 18 months   Coronary artery disease Other     Past Surgical History:  Procedure Laterality Date   ABDOMINAL HYSTERECTOMY     CARDIAC CATHETERIZATION     2011  DR COOPER (APPT NEXT WEEK)   CHOLECYSTECTOMY     CORONARY ARTERY BYPASS GRAFT  2011   LIMA-LAD, SVG-DIAG, SVG-OM1-OM2, SVG-PDA   CORONARY STENT PLACEMENT     Drug-eluting stent to the left anterior descending, circumflex and right coronary artery in Jan 2009   EYE SURGERY     BIL CATARACT REMOVAL 06/2010   LEFT HEART CATHETERIZATION WITH CORONARY ANGIOGRAM N/A 12/02/2013    Procedure: LEFT HEART CATHETERIZATION WITH CORONARY ANGIOGRAM;  Surgeon: Victory LELON Claudene DOUGLAS, MD;  Location: [facility] CATH LAB;  Service: Cardiovascular;  Laterality: N/A;   OVARIAN CYST REMOVAL     PACEMAKER INSERTION  03/06/12   MDT Adapta L implanted by Dr Kelsie for SSS   PERMANENT PACEMAKER INSERTION N/A 03/06/2012   Procedure: PERMANENT PACEMAKER INSERTION;  Surgeon: Lynwood Kelsie, MD;  Location: [facility] CATH LAB;  Service: Cardiovascular;  Laterality: N/A;   SHOULDER ARTHROSCOPY  06/16/2011   Procedure: ARTHROSCOPY SHOULDER;  Surgeon: Rome JULIANNA Pepper, MD;  Location: [facility] OR;  Service: Orthopedics;  Laterality: Left;  LEFT SHOULDER ARTHROSCOPY ACROMIALPLASTY, POSSIBLE MINI OPEN CUFF REPAIR    TUBAL LIGATION     Social History   Occupational History   Occupation: RETIRED LPN    Comment: worked as [employer]  Tobacco Use   Smoking status: Former    Current packs/day: 0.00    Average packs/day: 0.3 packs/day for 28.0 years (7.0 ttl pk-yrs)    Types: Cigarettes    Start date: 1966    Quit date: 1994    Years since quitting: 31.7   Smokeless tobacco: Never   Tobacco comments:    quit 30 yrs ago  Vaping Use   Vaping status: Never Used  Substance and Sexual Activity   Alcohol use: No   Drug use: No   Sexual activity: Not on file

## 2024-01-01 ENCOUNTER — Telehealth: Payer: Self-pay | Admitting: Cardiovascular Disease

## 2024-01-01 ENCOUNTER — Other Ambulatory Visit

## 2024-01-01 ENCOUNTER — Ambulatory Visit: Payer: Self-pay

## 2024-01-01 NOTE — Telephone Encounter (Signed)
 Pt c/o Shortness Of Breath: STAT if SOB developed within the last 24 hours or pt is noticeably SOB on the phone  1. Are you currently SOB (can you hear that pt is SOB on the phone)? Patient states she is currently SOB  2. How long have you been experiencing SOB? Patient states it started this morning, and it's getting worse.  3. Are you SOB when sitting or when up moving around? Both   4. Are you currently experiencing any other symptoms? Patient states she just feels dizzy.

## 2024-01-01 NOTE — Telephone Encounter (Signed)
 Agree  Thank you

## 2024-01-01 NOTE — Telephone Encounter (Signed)
 FYI Only or Action Required?: FYI only for provider.  Patient was last seen in primary care on 12/15/2023 by Antonio Meth, [name], DO.  Called Nurse Triage reporting Shortness of Breath.  Symptoms began today.  Interventions attempted: Nothing.  Symptoms are: gradually worsening.  Triage Disposition: Go to ED Now (Notify PCP)  Patient/caregiver understands and will follow disposition?: Yes    Copied from CRM [id number]. Topic: Clinical - Red Word Triage >> Jan 01, 2024 11:40 AM Carlatta H wrote: [facility] that prompted transfer to Nurse Triage: Patient is having shortness breath Reason for Disposition  [1] MODERATE difficulty breathing (e.g., speaks in phrases, SOB even at rest, pulse 100-120) AND [2] NEW-onset or WORSE than normal  Answer Assessment - Initial Assessment Questions 1. RESPIRATORY STATUS: Describe your breathing? (e.g., wheezing, shortness of breath, unable to speak, severe coughing)      Patient states she started having shortness of breath when she woke up and it has been worsening over the course of the day and she can hardly walk to the bathroom 2. ONSET: When did this breathing problem begin?      Started today and it has been getting worse 3. PATTERN Does the difficult breathing come and go, or has it been constant since it started?      Started when she woke up and worsened throughout day 4. SEVERITY: How bad is your breathing? (e.g., mild, moderate, severe)      Patient states she can hardly go to the bathroom without being short of breath 6. CARDIAC HISTORY: Do you have any history of heart disease? (e.g., heart attack, angina, bypass surgery, angioplasty)      Pacemaker 7. LUNG HISTORY: Do you have any history of lung disease?  (e.g., pulmonary embolus, asthma, emphysema)     No 8. CAUSE: What do you think is causing the breathing problem?      Patient uncertain but states she is curious if she should call cardiologist 9. OTHER SYMPTOMS: Do  you have any other symptoms? (e.g., chest pain, cough, dizziness, fever, runny nose)     Unable to assess - sent patient to ED 10. O2 SATURATION MONITOR:  Do you use an oxygen saturation monitor (pulse oximeter) at home? If Yes, ask: What is your reading (oxygen level) today? What is your usual oxygen saturation reading? (e.g., 95%)       Unable to assess  Protocols used: Breathing Difficulty-A-AH

## 2024-01-01 NOTE — Telephone Encounter (Signed)
 Spoke with patient of Dr. Wonda who reports shortness of breath that started this morning. She is short of breath when sitting and when moving. She has been using her albuterol  inhaler with minimal relief. She has used this 4x. She reports swelling bilaterally in LE - stable, unchanged. She has not gained weight. She denies chest pain, denies palpitations. She also reports dizziness - does not have a way to monitor. She is not dehydrated. She called to PCP office who advised ED eval. Given acute onset SOB, despite absence of other cardiac concerns, advised ED eval as well. She is near [facility] -- advised to go to this location.

## 2024-01-01 NOTE — Telephone Encounter (Signed)
FYI. Pt going to ED.  

## 2024-01-02 ENCOUNTER — Other Ambulatory Visit (INDEPENDENT_AMBULATORY_CARE_PROVIDER_SITE_OTHER)

## 2024-01-02 DIAGNOSIS — N1832 Chronic kidney disease, stage 3b: Secondary | ICD-10-CM | POA: Diagnosis not present

## 2024-01-02 DIAGNOSIS — R197 Diarrhea, unspecified: Secondary | ICD-10-CM

## 2024-01-02 LAB — COMPREHENSIVE METABOLIC PANEL WITH GFR
ALT: 14 U/L (ref 0–35)
AST: 15 U/L (ref 0–37)
Albumin: 3.7 g/dL (ref 3.5–5.2)
Alkaline Phosphatase: 66 U/L (ref 39–117)
BUN: 35 mg/dL — ABNORMAL HIGH (ref 6–23)
CO2: 29 meq/L (ref 19–32)
Calcium: 8.4 mg/dL (ref 8.4–10.5)
Chloride: 105 meq/L (ref 96–112)
Creatinine, Ser: 1.52 mg/dL — ABNORMAL HIGH (ref 0.40–1.20)
GFR: 30.29 mL/min — ABNORMAL LOW (ref >60.00)
Glucose, Bld: 150 mg/dL — ABNORMAL HIGH (ref 70–99)
Potassium: 4.5 meq/L (ref 3.5–5.1)
Sodium: 145 meq/L (ref 135–145)
Total Bilirubin: 0.7 mg/dL (ref 0.2–1.2)
Total Protein: 6.1 g/dL (ref 6.0–8.3)

## 2024-01-04 ENCOUNTER — Other Ambulatory Visit

## 2024-01-04 DIAGNOSIS — H353111 Nonexudative age-related macular degeneration, right eye, early dry stage: Secondary | ICD-10-CM | POA: Diagnosis not present

## 2024-01-04 DIAGNOSIS — R197 Diarrhea, unspecified: Secondary | ICD-10-CM | POA: Diagnosis not present

## 2024-01-04 DIAGNOSIS — H353221 Exudative age-related macular degeneration, left eye, with active choroidal neovascularization: Secondary | ICD-10-CM | POA: Diagnosis not present

## 2024-01-04 DIAGNOSIS — H35371 Puckering of macula, right eye: Secondary | ICD-10-CM | POA: Diagnosis not present

## 2024-01-04 DIAGNOSIS — H43813 Vitreous degeneration, bilateral: Secondary | ICD-10-CM | POA: Diagnosis not present

## 2024-01-04 DIAGNOSIS — E113293 Type 2 diabetes mellitus with mild nonproliferative diabetic retinopathy without macular edema, bilateral: Secondary | ICD-10-CM | POA: Diagnosis not present

## 2024-01-04 NOTE — Addendum Note (Signed)
 Addended by: TRUDY CURVIN RAMAN on: 01/04/2024 01:25 PM   Modules accepted: Orders

## 2024-01-06 LAB — SPECIMEN STATUS REPORT

## 2024-01-09 ENCOUNTER — Ambulatory Visit: Payer: Self-pay | Admitting: Family Medicine

## 2024-01-10 LAB — CDIFF NAA+O+P+STOOL CULTURE: E coli, Shiga toxin Assay: NEGATIVE

## 2024-01-15 ENCOUNTER — Telehealth: Payer: Self-pay

## 2024-01-15 NOTE — Telephone Encounter (Unsigned)
 Copied from CRM #[id number]. Topic: Clinical - Home Health Verbal Orders >> Jan 15, 2024  1:49 PM Robinson DEL wrote: Caller/Agency: [facility] Callback Number: [phone] Service Requested: Physical Therapy Frequency: 2 week 3, 1 week 3 Any new concerns about the patient? No

## 2024-01-16 DIAGNOSIS — I5030 Unspecified diastolic (congestive) heart failure: Secondary | ICD-10-CM | POA: Diagnosis not present

## 2024-01-16 DIAGNOSIS — I2510 Atherosclerotic heart disease of native coronary artery without angina pectoris: Secondary | ICD-10-CM | POA: Diagnosis not present

## 2024-01-16 DIAGNOSIS — E1122 Type 2 diabetes mellitus with diabetic chronic kidney disease: Secondary | ICD-10-CM | POA: Diagnosis not present

## 2024-01-16 DIAGNOSIS — N1832 Chronic kidney disease, stage 3b: Secondary | ICD-10-CM | POA: Diagnosis not present

## 2024-01-16 DIAGNOSIS — E785 Hyperlipidemia, unspecified: Secondary | ICD-10-CM | POA: Diagnosis not present

## 2024-01-16 DIAGNOSIS — I129 Hypertensive chronic kidney disease with stage 1 through stage 4 chronic kidney disease, or unspecified chronic kidney disease: Secondary | ICD-10-CM | POA: Diagnosis not present

## 2024-01-16 DIAGNOSIS — M109 Gout, unspecified: Secondary | ICD-10-CM | POA: Diagnosis not present

## 2024-01-16 NOTE — Telephone Encounter (Signed)
 Verbal given

## 2024-01-22 ENCOUNTER — Other Ambulatory Visit: Payer: Self-pay | Admitting: Family Medicine

## 2024-01-22 ENCOUNTER — Ambulatory Visit: Payer: Self-pay | Admitting: Family Medicine

## 2024-01-22 ENCOUNTER — Telehealth: Payer: Self-pay

## 2024-01-22 ENCOUNTER — Ambulatory Visit: Payer: Self-pay

## 2024-01-22 ENCOUNTER — Ambulatory Visit: Admitting: Family Medicine

## 2024-01-22 VITALS — BP 130/90 | HR 60 | Temp 97.1°F | Resp 20 | Ht 67.0 in

## 2024-01-22 DIAGNOSIS — M79662 Pain in left lower leg: Secondary | ICD-10-CM

## 2024-01-22 DIAGNOSIS — R079 Chest pain, unspecified: Secondary | ICD-10-CM

## 2024-01-22 DIAGNOSIS — M79661 Pain in right lower leg: Secondary | ICD-10-CM

## 2024-01-22 DIAGNOSIS — R600 Localized edema: Secondary | ICD-10-CM

## 2024-01-22 LAB — D-DIMER, QUANTITATIVE: D-Dimer, Quant: 1.04 ug{FEU}/mL — ABNORMAL HIGH (ref <0.50)

## 2024-01-22 MED ORDER — TORSEMIDE 20 MG PO TABS: 20.0000 mg | ORAL_TABLET | Freq: Every day | ORAL | 2 refills | Status: DC

## 2024-01-22 NOTE — Telephone Encounter (Signed)
 Appt scheduled

## 2024-01-22 NOTE — Progress Notes (Signed)
 Subjective:    Patient ID: [name], female    DOB: Sep 25, 1932, 89 y.o.   MRN: [id number]  Chief Complaint  Patient presents with   Leg Swelling    Bilateral leg and ankle swelling, legs didn't return to normal over the evening. Some pain in the left leg    HPI Patient is in today for edema.  Discussed the use of AI scribe software for clinical note transcription with the patient, who gave verbal consent to proceed.  History of Present Illness [name] is an 89 year old female who presents with bilateral leg swelling and pain. She is accompanied by her grandson.  She has been experiencing progressive swelling in both legs since Saturday, with the left leg more affected than the right. The swelling is present upon waking and is accompanied by pain, particularly in the left calf, described as a 'tight pad'. The right leg is also swollen, causing some pain in the back of the leg.  She took her Lasix  today but feels it is not as effective as before, as she is not experiencing increased urination.  She experienced some breathing difficulty last night but is stable when resting. No current chest pain, though she mentions having had mild chest pain previously.    Past Medical History:  Diagnosis Date   Allergic rhinitis    Anemia    Anxiety    Barrett esophagus    CAD (coronary artery disease) 2009   a. Multivessel s/p PCI w/DES 2009 // b. s/p CABG 2011  //  c. LHC 8/15: pLAD 95 ISR, LCx 100, pOM1 40, dRCA 100, S-OM1/OM2 ok, S-D1 ok, S-PDA ok, L-LAD ok, EF 60%   Carotid artery disease    a. Carotid US  9/15: RICA 1-39%; LICA 40-59% >> FU 1 year  //  b. Carotid US  9/17: R 1-39%, L 40-59% >> FU 1 year   Chronic diastolic heart failure (HCC)    CKD (chronic kidney disease), stage II    GFR 60-89 ml/min   Depression    Disc disease, degenerative, cervical    Diverticulosis    Gastroparesis    GERD (gastroesophageal reflux disease)    Gout    H/O hiatal hernia     Helicobacter pylori gastritis    History of echocardiogram    a. Echo 11/13: EF 55% to 60%. Grade 2 diastolic dysfunction, MAC, trivial MR, mild LAE, normal RVSF, mild RAE, PASP 39 mmHg  //  b. Echo 4/17: EF 55-60%, normal wall motion, trivial AI, MAC, moderate LAE, mild RVE, PASP 35 mmHg   History of thrombocytopenia    HTN (hypertension)    Hyperlipidemia    Hypothyroidism    LBP (low back pain)    Lumbar disc disease/lumbar spinal stenosis   Macular degeneration    Morbid obesity (HCC)    Myocardial infarction (HCC)    Osteoarthritis    Osteopenia    PVD (peripheral vascular disease)    Sick sinus syndrome (HCC)    MDT Dual-chamber PPM implant 02/2012   Type II or unspecified type diabetes mellitus without mention of complication, not stated as uncontrolled     Past Surgical History:  Procedure Laterality Date   ABDOMINAL HYSTERECTOMY     CARDIAC CATHETERIZATION     2011  DR COOPER (APPT NEXT WEEK)   CHOLECYSTECTOMY     CORONARY ARTERY BYPASS GRAFT  2011   LIMA-LAD, SVG-DIAG, SVG-OM1-OM2, SVG-PDA   CORONARY STENT PLACEMENT     Drug-eluting  stent to the left anterior descending, circumflex and right coronary artery in Jan 2009   EYE SURGERY     BIL CATARACT REMOVAL 06/2010   LEFT HEART CATHETERIZATION WITH CORONARY ANGIOGRAM N/A 12/02/2013   Procedure: LEFT HEART CATHETERIZATION WITH CORONARY ANGIOGRAM;  Surgeon: Victory LELON Claudene DOUGLAS, MD;  Location: [facility] CATH LAB;  Service: Cardiovascular;  Laterality: N/A;   OVARIAN CYST REMOVAL     PACEMAKER INSERTION  03/06/12   MDT Adapta L implanted by Dr Kelsie for SSS   PERMANENT PACEMAKER INSERTION N/A 03/06/2012   Procedure: PERMANENT PACEMAKER INSERTION;  Surgeon: Lynwood Kelsie, MD;  Location: [facility] CATH LAB;  Service: Cardiovascular;  Laterality: N/A;   SHOULDER ARTHROSCOPY  06/16/2011   Procedure: ARTHROSCOPY SHOULDER;  Surgeon: Rome JULIANNA Pepper, MD;  Location: [facility] OR;  Service: Orthopedics;  Laterality: Left;  LEFT SHOULDER ARTHROSCOPY  ACROMIALPLASTY, POSSIBLE MINI OPEN CUFF REPAIR    TUBAL LIGATION      Family History  Problem Relation Age of Onset   Diabetes Mother    Hypertension Mother    Heart attack Mother    Stroke Father    Cancer Brother    Deafness Daughter    Other Son        cerebral edema at 18 months   Coronary artery disease Other     Social History   Socioeconomic History   Marital status: Widowed    Spouse name: Not on file   Number of children: Not on file   Years of education: Not on file   Highest education level: Not on file  Occupational History   Occupation: RETIRED LPN    Comment: worked as [employer]  Tobacco Use   Smoking status: Former    Current packs/day: 0.00    Average packs/day: 0.3 packs/day for 28.0 years (7.0 ttl pk-yrs)    Types: Cigarettes    Start date: 89    Quit date: 95    Years since quitting: 31.8   Smokeless tobacco: Never   Tobacco comments:    quit 30 yrs ago  Vaping Use   Vaping status: Never Used  Substance and Sexual Activity   Alcohol use: No   Drug use: No   Sexual activity: Not on file  Other Topics Concern   Not on file  Social History Narrative   Widowed 2004.., Lives with daughter and grand son ,Family history is negative for premature coronary artery disease. Mother died at age 31 with heart disease in her later years, father died at age 70 from a stroke.SABRAShe  has 8 siblings, none of whom have coronary artery disease.   Social Drivers of Corporate investment banker Strain: Low Risk  (12/26/2023)   Overall Financial Resource Strain (CARDIA)    Difficulty of Paying Living Expenses: Not very hard  Food Insecurity: No Food Insecurity (12/26/2023)   Hunger Vital Sign    Worried About Running Out of Food in the Last Year: Never true    Ran Out of Food in the Last Year: Never true  Transportation Needs: No Transportation Needs (12/26/2023)   PRAPARE - Administrator, Civil Service (Medical): No    Lack of Transportation  (Non-Medical): No  Physical Activity: Insufficiently Active (12/26/2023)   Exercise Vital Sign    Days of Exercise per Week: 2 days    Minutes of Exercise per Session: 10 min  Stress: No Stress Concern Present (12/26/2023)   [employer] of Occupational Health - Occupational Stress Questionnaire  Feeling of Stress: Only a little  Social Connections: Moderately Isolated (12/26/2023)   Social Connection and Isolation Panel    Frequency of Communication with Friends and Family: More than three times a week    Frequency of Social Gatherings with Friends and Family: Once a week    Attends Religious Services: More than 4 times per year    Active Member of [employer] or Organizations: No    Attends Banker Meetings: Never    Marital Status: Widowed  Intimate Partner Violence: Not At Risk (12/26/2023)   Humiliation, Afraid, Rape, and Kick questionnaire    Fear of Current or Ex-Partner: No    Emotionally Abused: No    Physically Abused: No    Sexually Abused: No    Outpatient Medications Prior to Visit  Medication Sig Dispense Refill   ACCU-CHEK GUIDE test strip CHECK GLUCOSE IN THE MORNING AT  NOON AND AT BEDTIME 100 strip 9   acetaminophen  (TYLENOL ) 500 MG tablet Take 500 mg by mouth every 6 (six) hours as needed for headache (pain).     albuterol  (VENTOLIN  HFA) 108 (90 Base) MCG/ACT inhaler Inhale 2 puffs into the lungs every 6 (six) hours as needed for wheezing or shortness of breath. 8 g 2   allopurinol  (ZYLOPRIM ) 100 MG tablet Take 1 tablet (100 mg total) by mouth at bedtime. 90 tablet 3   amLODipine  (NORVASC ) 5 MG tablet Take 1 tablet (5 mg total) by mouth at bedtime. 100 each 3   atorvastatin  (LIPITOR) 20 MG tablet TAKE 1 TABLET BY MOUTH ONCE  DAILY 100 tablet 2   azelastine (OPTIVAR) 0.05 % ophthalmic solution Place 1 drop into both eyes 2 (two) times daily.     carvedilol  (COREG ) 12.5 MG tablet TAKE 1 TABLET BY MOUTH TWICE  DAILY 200 tablet 0   clopidogrel  (PLAVIX ) 75 MG  tablet TAKE 1 TABLET BY MOUTH DAILY 90 tablet 3   ENTRESTO  97-103 MG TAKE 1 TABLET BY MOUTH TWICE  DAILY 200 tablet 2   furosemide  (LASIX ) 40 MG tablet Take 1 tablet (40 mg total) by mouth daily. 90 tablet 3   ketoconazole  (NIZORAL ) 2 % cream Apply 1 Application topically daily. 30 g 0   methocarbamol  (ROBAXIN ) 500 MG tablet Take 1 tablet (500 mg total) by mouth every 6 (six) hours as needed for muscle spasms. 120 tablet 0   Multiple Vitamins-Minerals (ONE A DAY WOMEN 50 PLUS) TABS Take 1 tablet by mouth daily.     mupirocin  ointment (BACTROBAN ) 2 % Apply 1 Application topically daily as needed. 22 g 0   nitroGLYCERIN  (NITROSTAT ) 0.4 MG SL tablet DISSOLVE ONE TABLET UNDER THE TONGUE EVERY 5 MINUTES AS NEEDED FOR CHEST PAIN.  DO NOT EXCEED A TOTAL OF 3 DOSES IN 15 MINUTES 25 tablet 3   pantoprazole  (PROTONIX ) 40 MG tablet Take 1 tablet (40 mg total) by mouth daily. 90 tablet 3   potassium chloride  (KLOR-CON ) 10 MEQ tablet Take 1 tablet (10 mEq total) by mouth daily. 90 tablet 3   predniSONE  (DELTASONE ) 10 MG tablet TAKE 3 TABLETS PO QD FOR 3 DAYS THEN TAKE 2 TABLETS PO QD FOR 3 DAYS THEN TAKE 1 TABLET PO QD FOR 3 DAYS THEN TAKE 1/2 TAB PO QD FOR 3 DAYS 20 tablet 0   Accu-Chek Softclix Lancets lancets CHECK GLUCOSE IN THE MORNING AT  NOON AND AT BEDTIME (Patient not taking: Reported on 01/22/2024) 100 each 9   No facility-administered medications prior to visit.  Allergies  Allergen Reactions   Codeine Other (See Comments)    Hallucinations   Hydrocodone Other (See Comments)    Made her pass out   Penicillins Hives   Shellfish Allergy Hives   Sulfonamide Derivatives Nausea And Vomiting   Cipro  [Ciprofloxacin  Hcl] Nausea And Vomiting and Other (See Comments)    Syncope    Morphine  And Codeine Other (See Comments)    Hallucinations Went crazy   Tiazac [Diltiazem] Other (See Comments)    Bradycardia    Glucophage  [Metformin ] Diarrhea   Mevacor [Lovastatin] Other (See Comments)     Unknown reaction    Review of Systems  Constitutional:  Negative for fever and malaise/fatigue.  HENT:  Negative for congestion.   Eyes:  Negative for blurred vision.  Respiratory:  Positive for shortness of breath.   Cardiovascular:  Positive for chest pain and leg swelling. Negative for palpitations.  Gastrointestinal:  Negative for abdominal pain, blood in stool and nausea.  Genitourinary:  Negative for dysuria and frequency.  Musculoskeletal:  Negative for falls.  Skin:  Negative for rash.  Neurological:  Negative for dizziness, loss of consciousness and headaches.  Endo/Heme/Allergies:  Negative for environmental allergies.  Psychiatric/Behavioral:  Negative for depression. The patient is not nervous/anxious.        Objective:    Physical Exam Vitals and nursing note reviewed.  Constitutional:      General: She is not in acute distress.    Appearance: Normal appearance. She is well-developed.  HENT:     Head: Normocephalic and atraumatic.  Eyes:     General: No scleral icterus.       Right eye: No discharge.        Left eye: No discharge.  Cardiovascular:     Rate and Rhythm: Normal rate and regular rhythm.     Heart sounds: Murmur heard.  Pulmonary:     Effort: Pulmonary effort is normal. No respiratory distress.     Breath sounds: Normal breath sounds.  Musculoskeletal:        General: Swelling present. Normal range of motion.     Cervical back: Normal range of motion and neck supple.     Right lower leg: No tenderness. 3+ Pitting Edema present.     Left lower leg: Tenderness present. 2+ Pitting Edema present.  Skin:    General: Skin is warm and dry.  Neurological:     Mental Status: She is alert and oriented to person, place, and time.  Psychiatric:        Mood and Affect: Mood normal.        Behavior: Behavior normal.        Thought Content: Thought content normal.        Judgment: Judgment normal.     BP (!) 130/90 (BP Location: Left Arm, Patient  Position: Sitting, Cuff Size: Large)   Pulse 60   Temp (!) 97.1 F (36.2 C) (Oral)   Resp 20   Ht 5' 7 (1.702 m)   SpO2 98%   BMI 28.19 kg/m  Wt Readings from Last 3 Encounters:  12/26/23 180 lb (81.6 kg)  12/15/23 180 lb (81.6 kg)  11/28/23 183 lb 12.8 oz (83.4 kg)    Diabetic Foot Exam - Simple   No data filed    Lab Results  Component Value Date   WBC 7.3 11/17/2023   HGB 11.8 (L) 11/17/2023   HCT 36.5 11/17/2023   PLT 177 11/17/2023   GLUCOSE 150 (H) 01/02/2024  CHOL 131 09/05/2023   TRIG 121.0 09/05/2023   HDL 67.40 09/05/2023   LDLDIRECT 82.5 09/11/2009   LDLCALC 39 09/05/2023   ALT 14 01/02/2024   AST 15 01/02/2024   NA 145 01/02/2024   K 4.5 01/02/2024   CL 105 01/02/2024   CREATININE 1.52 (H) 01/02/2024   BUN 35 (H) 01/02/2024   CO2 29 01/02/2024   TSH 4.78 03/31/2022   INR 1.1 08/29/2023   HGBA1C 7.4 (H) 09/05/2023   MICROALBUR 6.6 (H) 09/05/2023    Lab Results  Component Value Date   TSH 4.78 03/31/2022   Lab Results  Component Value Date   WBC 7.3 11/17/2023   HGB 11.8 (L) 11/17/2023   HCT 36.5 11/17/2023   MCV 95.3 11/17/2023   PLT 177 11/17/2023   Lab Results  Component Value Date   NA 145 01/02/2024   K 4.5 01/02/2024   CO2 29 01/02/2024   GLUCOSE 150 (H) 01/02/2024   BUN 35 (H) 01/02/2024   CREATININE 1.52 (H) 01/02/2024   BILITOT 0.7 01/02/2024   ALKPHOS 66 01/02/2024   AST 15 01/02/2024   ALT 14 01/02/2024   PROT 6.1 01/02/2024   ALBUMIN 3.7 01/02/2024   CALCIUM  8.4 01/02/2024   ANIONGAP 11 11/17/2023   EGFR 25 (L) 11/24/2023   GFR 30.29 (L) 01/02/2024   Lab Results  Component Value Date   CHOL 131 09/05/2023   Lab Results  Component Value Date   HDL 67.40 09/05/2023   Lab Results  Component Value Date   LDLCALC 39 09/05/2023   Lab Results  Component Value Date   TRIG 121.0 09/05/2023   Lab Results  Component Value Date   CHOLHDL 2 09/05/2023   Lab Results  Component Value Date   HGBA1C 7.4 (H)  09/05/2023   EKG-- pacemaker    Assessment & Plan:  Lower extremity edema -     CBC with Differential/Platelet -     Comprehensive metabolic panel with GFR -     D-dimer, quantitative -     US  Venous Img Lower Bilateral (DVT); Future -     Brain natriuretic peptide -     Torsemide; Take 1 tablet (20 mg total) by mouth daily.  Dispense: 30 tablet; Refill: 2  Chest pain, unspecified type -     D-dimer, quantitative -     EKG 12-Lead -     Troponin I -  Bilateral calf pain -     US  Venous Img Lower Bilateral (DVT); Future  Other orders -     TIQ-NTM  Assessment and Plan Assessment & Plan Bilateral lower extremity edema and pain   She experiences bilateral lower extremity edema with more significant swelling and pain in the left leg. Suspected diuretic resistance as Lasix  may not be effective. Order ultrasounds of the legs. Consider increasing Lasix  dosage if no other underlying cause is identified.  Shortness of breath   She has intermittent shortness of breath. No current chest pain, but there was a previous episode. Order EKG to assess cardiac function and rule out any acute cardiac events. Order blood work.  Possible diuretic resistance   Lasix  may not be as effective as previously, indicated by reduced urination and persistent edema. Consider diuretic resistance. Evaluate the effectiveness of Lasix  after ruling out other causes of edema and shortness of breath. Consider adjusting diuretic therapy based on test results.  Hypertension   Blood pressure is elevated. The current management plan may need adjustment depending on the  results of the cardiac evaluation and the effectiveness of diuretic therapy.    Lorain Keast R Lowne Chase, DO

## 2024-01-22 NOTE — Telephone Encounter (Signed)
 Copied from CRM [id number]. Topic: General - Other >> Jan 22, 2024  9:33 AM Montie POUR wrote: Reason for CRM:  Adalaide's right elbow is stiff and sore. She has had an X-ray and seeing an ortho doctor. Mallie will contact Dr. Glenis office at [phone].  Thanks

## 2024-01-22 NOTE — Telephone Encounter (Signed)
 FYI Only or Action Required?: FYI only for provider.  Patient was last seen in primary care on 12/15/2023 by Antonio Meth, [name], DO.  Called Nurse Triage reporting Leg Swelling and Elbow Pain.  Symptoms began several days ago.  Interventions attempted: Nothing.  Symptoms are: weight gain, bilateral lower leg swelling (worse on the left), chest pain last night resolved and not present now gradually worsening.  Triage Disposition: See HCP Within 4 Hours (Or PCP Triage)  Patient/caregiver understands and will follow disposition?: Yes       Copied from CRM #[id number]. 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 ONSET: When did the swelling start? (e.g., minutes, hours, days)     Saturday.  2. LOCATION: What part of the leg is swollen?  Are both legs swollen or just one leg?     Both calves, increase about a cm in swelling in ankles as well. Worse on the left side.  3. SEVERITY: How bad is the swelling? (e.g., localized; mild, moderate, severe)     Moderate.  4. REDNESS: Is there redness or signs of infection?     No.  5. PAIN: Is the swelling painful to touch? If Yes, ask: How painful is it?   (Scale 1-10; mild, moderate or severe)     A little bit, not a lot  6. FEVER: Do you have a fever? If Yes, ask: What is it, how was it measured, and when did it start?      No.  7. CAUSE: What do you think is causing the leg swelling?     Unsure.  8. MEDICAL HISTORY: Do you have a history of blood clots (e.g., DVT), cancer, heart failure, kidney disease, or liver failure?     Heart failure, kidney disease.  9. RECURRENT SYMPTOM: Have you had  leg swelling before? If Yes, ask: When was the last time? What happened that time?     Patient states she always has some swelling in her legs and the swelling is usually improved when she wakes up in the morning. This is the worst they have been.  10. OTHER SYMPTOMS: Do you have any other symptoms? (e.g., chest pain, difficulty breathing)       9 pound weight gain since last weeks home health appointment (she is unsure who did the assessment and if they weighed her properly); bilateral elbow pain. Last night a little bit of chest pain but not much. Denies SOB or any labored breathing; current chest pain.  11. PREGNANCY: Is there any chance you are pregnant? When was your last menstrual period?       N/A.  Protocols used: Leg Swelling and Edema-A-AH, Heart Failure on Treatment Follow-up Call-A-AH

## 2024-01-23 ENCOUNTER — Ambulatory Visit (HOSPITAL_BASED_OUTPATIENT_CLINIC_OR_DEPARTMENT_OTHER)
Admission: RE | Admit: 2024-01-23 | Discharge: 2024-01-23 | Disposition: A | Discharge status: Home / Self Care | Source: Ambulatory Visit | Attending: Family Medicine | Admitting: Family Medicine

## 2024-01-23 DIAGNOSIS — R600 Localized edema: Secondary | ICD-10-CM | POA: Insufficient documentation

## 2024-01-23 DIAGNOSIS — M79662 Pain in left lower leg: Secondary | ICD-10-CM | POA: Diagnosis not present

## 2024-01-23 DIAGNOSIS — M79661 Pain in right lower leg: Secondary | ICD-10-CM | POA: Insufficient documentation

## 2024-01-23 DIAGNOSIS — M7121 Synovial cyst of popliteal space [Baker], right knee: Secondary | ICD-10-CM | POA: Diagnosis not present

## 2024-01-23 LAB — COMPREHENSIVE METABOLIC PANEL WITH GFR
ALT: 19 U/L (ref 0–35)
AST: 26 U/L (ref 0–37)
Albumin: 3.7 g/dL (ref 3.5–5.2)
Alkaline Phosphatase: 74 U/L (ref 39–117)
BUN: 30 mg/dL — ABNORMAL HIGH (ref 6–23)
CO2: 29 meq/L (ref 19–32)
Calcium: 8.7 mg/dL (ref 8.4–10.5)
Chloride: 100 meq/L (ref 96–112)
Creatinine, Ser: 1.47 mg/dL — ABNORMAL HIGH (ref 0.40–1.20)
GFR: 31.52 mL/min — ABNORMAL LOW (ref >60.00)
Glucose, Bld: 156 mg/dL — ABNORMAL HIGH (ref 70–99)
Potassium: 4 meq/L (ref 3.5–5.1)
Sodium: 139 meq/L (ref 135–145)
Total Bilirubin: 0.5 mg/dL (ref 0.2–1.2)
Total Protein: 6.5 g/dL (ref 6.0–8.3)

## 2024-01-23 LAB — CBC WITH DIFFERENTIAL/PLATELET
Basophils Absolute: 0.1 K/uL (ref 0.0–0.1)
Basophils Relative: 0.7 % (ref 0.0–3.0)
Eosinophils Absolute: 0.2 K/uL (ref 0.0–0.7)
Eosinophils Relative: 2.8 % (ref 0.0–5.0)
HCT: 35.7 % — ABNORMAL LOW (ref 36.0–46.0)
Hemoglobin: 11.8 g/dL — ABNORMAL LOW (ref 12.0–15.0)
Lymphocytes Relative: 11.7 % — ABNORMAL LOW (ref 12.0–46.0)
Lymphs Abs: 1 K/uL (ref 0.7–4.0)
MCHC: 33.1 g/dL (ref 30.0–36.0)
MCV: 94.2 fl (ref 78.0–100.0)
Monocytes Absolute: 0.7 K/uL (ref 0.1–1.0)
Monocytes Relative: 8 % (ref 3.0–12.0)
Neutro Abs: 6.3 K/uL (ref 1.4–7.7)
Neutrophils Relative %: 76.8 % (ref 43.0–77.0)
Platelets: 208 K/uL (ref 150.0–400.0)
RBC: 3.79 Mil/uL — ABNORMAL LOW (ref 3.87–5.11)
RDW: 13.3 % (ref 11.5–15.5)
WBC: 8.2 K/uL (ref 4.0–10.5)

## 2024-01-23 LAB — TROPONIN I: Troponin I: 26 ng/L (ref <=47)

## 2024-01-23 LAB — BRAIN NATRIURETIC PEPTIDE: Pro B Natriuretic peptide (BNP): 418 pg/mL — ABNORMAL HIGH (ref 0.0–100.0)

## 2024-01-23 LAB — TIQ-NTM

## 2024-01-25 ENCOUNTER — Encounter: Payer: Self-pay | Admitting: Family Medicine

## 2024-01-26 ENCOUNTER — Ambulatory Visit: Admitting: Surgical

## 2024-01-29 ENCOUNTER — Ambulatory Visit: Payer: Self-pay

## 2024-01-29 NOTE — Telephone Encounter (Signed)
 Appt made for tomorrow.

## 2024-01-29 NOTE — Telephone Encounter (Signed)
 FYI Only or Action Required?: FYI only for provider.  Patient was last seen in primary care on 01/22/2024 by Antonio Meth, [name], DO.  Called Nurse Triage reporting Rash.  Symptoms began several days ago.  Interventions attempted: Rest, hydration, or home remedies and Other: topical ointment that was previously prescribed by PCP.  Symptoms are: gradually worsening.  Triage Disposition: See Physician Within 24 Hours  Patient/caregiver understands and will follow disposition?: Yes                            Copied from CRM #[id number]. Topic: Clinical - Red Word Triage >> Jan 29, 2024  4:06 PM Corin V wrote: [facility] that prompted transfer to Nurse Triage: Patient has a rash in her groin region that began bleeding yesterday. Reason for Disposition  [1] Looks infected (e.g., spreading redness, pus) AND [2] no fever  Answer Assessment - Initial Assessment Questions 1. APPEARANCE of RASH: What does the rash look like? (e.g., blisters, dry flaky skin, red spots, redness, sores)     Blisters, one open wound about the size of a quarter that is bleeding 2. LOCATION: Where is the rash located?      Bilateral groin, right side is worse 3. NUMBER: How many spots are there?      N/A 4. SIZE: How big are the spots? (e.g., inches, cm; or compare to size of pinhead, tip of pen, eraser, pea)      N/A 5. ONSET: When did the rash start?      States she noticed rash several days ago and noticed bleeding yesterday  6. ITCHING: Does the rash itch? If Yes, ask: How bad is the itch?  (Scale 0-10; or none, mild, moderate, severe)     Moderate-severe 7. PAIN: Does the rash hurt? If Yes, ask: How bad is the pain?  (Scale 0-10; or none, mild, moderate, severe)     Denies 8. OTHER SYMPTOMS: Do you have any other symptoms? (e.g., fever)     Unsure about fever- states she does not feel feverish, denies rash elsewhere, denies difficulty breathing, denies difficulty  swallowing, denies additional drainage 9. PREGNANCY: Is there any chance you are pregnant? When was your last menstrual period?     N/A  Protocols used: Rash or Redness - Localized-A-AH

## 2024-01-30 ENCOUNTER — Ambulatory Visit: Admitting: Family Medicine

## 2024-01-30 ENCOUNTER — Encounter: Payer: Self-pay | Admitting: Family Medicine

## 2024-01-30 VITALS — BP 132/78 | HR 59 | Temp 98.0°F | Resp 16 | Ht 67.0 in | Wt 180.0 lb

## 2024-01-30 DIAGNOSIS — R238 Other skin changes: Secondary | ICD-10-CM

## 2024-01-30 DIAGNOSIS — T148XXA Other injury of unspecified body region, initial encounter: Secondary | ICD-10-CM | POA: Diagnosis not present

## 2024-01-30 DIAGNOSIS — L308 Other specified dermatitis: Secondary | ICD-10-CM

## 2024-01-30 MED ORDER — TRIAMCINOLONE ACETONIDE 0.1 % EX CREA: 1.0000 | TOPICAL_CREAM | Freq: Two times a day (BID) | CUTANEOUS | 0 refills | Status: AC

## 2024-01-30 MED ORDER — MUPIROCIN 2 % EX OINT: 1.0000 | TOPICAL_OINTMENT | Freq: Two times a day (BID) | CUTANEOUS | 0 refills | Status: AC

## 2024-01-30 NOTE — Progress Notes (Signed)
 Chief Complaint  Patient presents with   Rash    Rash Groin    [name] is a 89 y.o. female here for a skin complaint.  She is here with her grandson.  Duration: 3 days Location: groin region Pruritic? Yes Painful? No Drainage? No New soaps/lotions/topicals/detergents? No Trauma? No Other associated symptoms: bleeding Not getting bigger, no fevers Therapies tried thus far: ketoconazole  seemed to make things worse  Past Medical History:  Diagnosis Date   Allergic rhinitis    Anemia    Anxiety    Barrett esophagus    CAD (coronary artery disease) 2009   a. Multivessel s/p PCI w/DES 2009 // b. s/p CABG 2011  //  c. LHC 8/15: pLAD 95 ISR, LCx 100, pOM1 40, dRCA 100, S-OM1/OM2 ok, S-D1 ok, S-PDA ok, L-LAD ok, EF 60%   Carotid artery disease    a. Carotid US  9/15: RICA 1-39%; LICA 40-59% >> FU 1 year  //  b. Carotid US  9/17: R 1-39%, L 40-59% >> FU 1 year   Chronic diastolic heart failure (HCC)    CKD (chronic kidney disease), stage II    GFR 60-89 ml/min   Depression    Disc disease, degenerative, cervical    Diverticulosis    Gastroparesis    GERD (gastroesophageal reflux disease)    Gout    H/O hiatal hernia    Helicobacter pylori gastritis    History of echocardiogram    a. Echo 11/13: EF 55% to 60%. Grade 2 diastolic dysfunction, MAC, trivial MR, mild LAE, normal RVSF, mild RAE, PASP 39 mmHg  //  b. Echo 4/17: EF 55-60%, normal wall motion, trivial AI, MAC, moderate LAE, mild RVE, PASP 35 mmHg   History of thrombocytopenia    HTN (hypertension)    Hyperlipidemia    Hypothyroidism    LBP (low back pain)    Lumbar disc disease/lumbar spinal stenosis   Macular degeneration    Morbid obesity (HCC)    Myocardial infarction (HCC)    Osteoarthritis    Osteopenia    PVD (peripheral vascular disease)    Sick sinus syndrome (HCC)    MDT Dual-chamber PPM implant 02/2012   Type II or unspecified type diabetes mellitus without mention of complication, not stated as  uncontrolled     BP 132/78 (BP Location: Left Arm, Patient Position: Sitting)   Pulse (!) 59   Temp 98 F (36.7 C) (Oral)   Resp 16   Ht 5' 7 (1.702 m)   Wt 180 lb (81.6 kg)   SpO2 99%   BMI 28.19 kg/m  Gen: awake, alert, appearing stated age Lungs: No accessory muscle use Skin: Examined in the presence of a female chaperone there is a superficial wound on the right underside of the panniculus.  No drainage, erythema, TTP, fluctuance, excessive warmth Psych: Age appropriate judgment and insight  Pruritic dermatitis - Plan: triamcinolone  cream (KENALOG ) 0.1 %  Wound of skin - Plan: mupirocin  ointment (BACTROBAN ) 2 %  Skin irritation  Dermatitis likely caused scratching leading to the superficial wound.  Bactroban  over the wound, Kenalog  around the itchy area.  Stop using the ketoconazole . F/u in 2 weeks with Dr. Antonio to ensure resolution, cancel if doing better. The patient and her grandson voiced understanding and agreement to the plan.  Mabel [place], DO 01/30/24 12:15 PM

## 2024-01-30 NOTE — Patient Instructions (Addendum)
 Try not to scratch as this can make things worse. Avoid scented products while dealing with this. You may resume when the itchiness resolves. Cold/cool compresses can help.   When you do wash it, use only soap and water. Do not vigorously scrub. Apply triple antibiotic ointment (like Neosporin) twice daily. Keep the area clean and dry.   Things to look out for: increasing pain not relieved by acetaminophen , fevers, spreading redness, drainage of pus, or foul odor.  Let us  know if you need anything.

## 2024-02-01 ENCOUNTER — Telehealth: Payer: Self-pay | Admitting: Cardiovascular Disease

## 2024-02-01 NOTE — Telephone Encounter (Signed)
 Pt requesting callback for help with heart monitor. Please advise.

## 2024-02-01 NOTE — Telephone Encounter (Signed)
 Appears [employer] reached out to patient today at 10:47 am to discuss her missing her remote transmission.   Will forward this to Danette Louder our remote specialist CMA to follow up with patient tomorrow.

## 2024-02-01 NOTE — Telephone Encounter (Signed)
 Spoke to patient . She states when she was in the office last- someone was suppose to contact her about her pacemaker--  They said it is dead , and know one has called me abut , like they said they would   Rn informed patient will send message to the device clinic to assist her. She verbalized understanding

## 2024-02-02 ENCOUNTER — Ambulatory Visit (HOSPITAL_BASED_OUTPATIENT_CLINIC_OR_DEPARTMENT_OTHER)
Admission: RE | Admit: 2024-02-02 | Discharge: 2024-02-02 | Disposition: A | Discharge status: Home / Self Care | Source: Ambulatory Visit | Attending: Family Medicine | Admitting: Family Medicine

## 2024-02-02 DIAGNOSIS — R079 Chest pain, unspecified: Secondary | ICD-10-CM | POA: Insufficient documentation

## 2024-02-02 DIAGNOSIS — M79661 Pain in right lower leg: Secondary | ICD-10-CM | POA: Diagnosis not present

## 2024-02-02 DIAGNOSIS — J479 Bronchiectasis, uncomplicated: Secondary | ICD-10-CM | POA: Diagnosis not present

## 2024-02-02 DIAGNOSIS — R7989 Other specified abnormal findings of blood chemistry: Secondary | ICD-10-CM | POA: Diagnosis not present

## 2024-02-02 DIAGNOSIS — M79662 Pain in left lower leg: Secondary | ICD-10-CM | POA: Diagnosis not present

## 2024-02-02 DIAGNOSIS — R918 Other nonspecific abnormal finding of lung field: Secondary | ICD-10-CM | POA: Diagnosis not present

## 2024-02-02 MED ORDER — IOHEXOL 350 MG/ML SOLN
70.0000 mL | Freq: Once | INTRAVENOUS | Status: AC | PRN
  Administered 2024-02-02: 70 mL via INTRAVENOUS

## 2024-02-03 ENCOUNTER — Other Ambulatory Visit: Payer: Self-pay | Admitting: Cardiovascular Disease

## 2024-02-04 ENCOUNTER — Ambulatory Visit: Payer: Self-pay | Admitting: Family Medicine

## 2024-02-05 NOTE — Telephone Encounter (Signed)
 Lvm for pt to call back also left [employer] tech support number

## 2024-02-09 ENCOUNTER — Encounter: Payer: Self-pay | Admitting: Cardiovascular Disease

## 2024-02-09 ENCOUNTER — Telehealth: Payer: Self-pay | Admitting: Cardiovascular Disease

## 2024-02-09 ENCOUNTER — Ambulatory Visit: Attending: Cardiovascular Disease | Admitting: Cardiovascular Disease

## 2024-02-09 VITALS — BP 168/64 | HR 68 | Resp 16 | Ht 67.0 in | Wt 190.2 lb

## 2024-02-09 DIAGNOSIS — I495 Sick sinus syndrome: Secondary | ICD-10-CM | POA: Diagnosis not present

## 2024-02-09 DIAGNOSIS — I5032 Chronic diastolic (congestive) heart failure: Secondary | ICD-10-CM | POA: Diagnosis not present

## 2024-02-09 DIAGNOSIS — I2510 Atherosclerotic heart disease of native coronary artery without angina pectoris: Secondary | ICD-10-CM

## 2024-02-09 DIAGNOSIS — I10 Essential (primary) hypertension: Secondary | ICD-10-CM

## 2024-02-09 DIAGNOSIS — E785 Hyperlipidemia, unspecified: Secondary | ICD-10-CM

## 2024-02-09 MED ORDER — TORSEMIDE 40 MG PO TABS: 40.0000 mg | ORAL_TABLET | Freq: Every day | ORAL | 3 refills | Status: DC

## 2024-02-09 NOTE — Telephone Encounter (Signed)
 Pt c/o medication issue:  1. Name of Medication:   furosemide  (LASIX ) 40 MG tablet    2. How are you currently taking this medication (dosage and times per day)? As written  3. Are you having a reaction (difficulty breathing--STAT)? no  4. What is your medication issue? Pharmacy does not have the 40 mg but they do have the 20 mg. They are asking for a new script to be sent in.

## 2024-02-09 NOTE — Assessment & Plan Note (Signed)
 Patient is stable with no anginal symptoms at present.  Continue clopidogrel  for antiplatelet therapy.

## 2024-02-09 NOTE — Telephone Encounter (Signed)
 Started to send in RX for Furosemide  20 mg take 2 tablets daily as furosemide  40 was on pt's medication list however Torsemide 40 mg daily is also on her list.  Will forward to MD to verify which medication he wants pt to take.  Of note - I did discontinue the rx for furosemide  40 mg while in this phone note then realized both medications were on her list.

## 2024-02-09 NOTE — Assessment & Plan Note (Signed)
 Amlodipine  discontinued in the setting of edema.  Increased loop diuretic and continue Entresto  and carvedilol  at current doses.

## 2024-02-09 NOTE — Progress Notes (Signed)
 Cardiology Office Note:    Date:  02/09/2024   ID:  [name], DOB 01-17-1933, MRN [id number]  PCP:  Antonio Meth, [name], DO   [facility] Providers Cardiologist:  Ozell Fell, MD Cardiology APP:  Lelon Glendia ONEIDA DEVONNA     Referring MD: Antonio Meth, [name], *   Chief Complaint  Patient presents with   Sick sinus syndrome    Chronic heart failure with preserved ejection fraction [facility] [place])   Coronary artery disease involving native coronary artery of   Shortness of Breath   Follow-up    History of Present Illness:    [name] is a 89 y.o. female with a hx of:  Coronary artery disease  S/p CABG in 2011 [facility] 12/02/2013: SVG-OM1/OM2, SVG-D1, SVG-PDA, LIMA-LAD patent, EF 60 Myoview  11/24/2015: EF 60, no ischemia, no infarct; low risk SSS s/p PPM (HFpEF) heart failure with preserved ejection fraction  TTE 07/13/2015: EF 55-60, normal wall motion, trivial AI, MAC, moderate LAE, PASP 35 TTE 01/31/2022: EF 55-60, no RWMA, GR 1 DD, mild reduced RVSF, normal PASP (RVSP 26.2), trivial MR, trivial AI, AV sclerosis without stenosis, RAP 3 Intol of SGLT2i (yeast infx); Spiro (diarrhea)   Hypertension Hyperlipidemia  Diabetes mellitus  Chronic kidney disease  Peripheral arterial disease  GERD/Barrett's esophagus Carotid artery disease US  10/19: Bilateral ICA 1-39 Hypothyroidism  The patient is here with her grandson today.  She is developed more problems with lower extremity swelling.  States that her edema is better after she has been sleeping but after upright for a little while her legs are quite swollen.  She has chronic orthopnea and sleeps on 3-4 pillows.  They report that this is unchanged over many years.  No recent chest pain or pressure.  No pain in her legs.  She is short of breath with low-level activity.  No cough, fevers, or chills.   Current Medications: Current Meds  Medication Sig   ACCU-CHEK GUIDE test strip CHECK GLUCOSE IN THE MORNING AT   NOON AND AT BEDTIME   Accu-Chek Softclix Lancets lancets CHECK GLUCOSE IN THE MORNING AT  NOON AND AT BEDTIME   acetaminophen  (TYLENOL ) 500 MG tablet Take 500 mg by mouth every 6 (six) hours as needed for headache (pain).   albuterol  (VENTOLIN  HFA) 108 (90 Base) MCG/ACT inhaler Inhale 2 puffs into the lungs every 6 (six) hours as needed for wheezing or shortness of breath.   allopurinol  (ZYLOPRIM ) 100 MG tablet Take 1 tablet (100 mg total) by mouth at bedtime.   amLODipine  (NORVASC ) 5 MG tablet Take 1 tablet (5 mg total) by mouth at bedtime.   atorvastatin  (LIPITOR) 20 MG tablet TAKE 1 TABLET BY MOUTH ONCE  DAILY   azelastine (OPTIVAR) 0.05 % ophthalmic solution Place 1 drop into both eyes 2 (two) times daily.   carvedilol  (COREG ) 12.5 MG tablet TAKE 1 TABLET BY MOUTH TWICE  DAILY   clopidogrel  (PLAVIX ) 75 MG tablet TAKE 1 TABLET BY MOUTH DAILY   methocarbamol  (ROBAXIN ) 500 MG tablet Take 1 tablet (500 mg total) by mouth every 6 (six) hours as needed for muscle spasms.   Multiple Vitamins-Minerals (ONE A DAY WOMEN 50 PLUS) TABS Take 1 tablet by mouth daily.   mupirocin  ointment (BACTROBAN ) 2 % Apply 1 Application topically 2 (two) times daily.   nitroGLYCERIN  (NITROSTAT ) 0.4 MG SL tablet DISSOLVE ONE TABLET UNDER THE TONGUE EVERY 5 MINUTES AS NEEDED FOR CHEST PAIN.  DO NOT EXCEED A TOTAL OF 3 DOSES  IN 15 MINUTES   pantoprazole  (PROTONIX ) 40 MG tablet Take 1 tablet (40 mg total) by mouth daily.   potassium chloride  (KLOR-CON ) 10 MEQ tablet Take 1 tablet (10 mEq total) by mouth daily.   predniSONE  (DELTASONE ) 10 MG tablet TAKE 3 TABLETS PO QD FOR 3 DAYS THEN TAKE 2 TABLETS PO QD FOR 3 DAYS THEN TAKE 1 TABLET PO QD FOR 3 DAYS THEN TAKE 1/2 TAB PO QD FOR 3 DAYS   sacubitril -valsartan  (ENTRESTO ) 97-103 MG TAKE 1 TABLET BY MOUTH TWICE  DAILY   torsemide 40 MG TABS Take 40 mg by mouth daily.   triamcinolone  cream (KENALOG ) 0.1 % Apply 1 Application topically 2 (two) times daily.   [DISCONTINUED]  furosemide  (LASIX ) 40 MG tablet Take 1 tablet (40 mg total) by mouth daily.   [DISCONTINUED] torsemide (DEMADEX) 20 MG tablet Take 1 tablet (20 mg total) by mouth daily.     Allergies:   Codeine, Hydrocodone, Penicillins, Shellfish allergy, Sulfonamide derivatives, Cipro  [ciprofloxacin  hcl], Morphine  and codeine, Tiazac [diltiazem], Glucophage  [metformin ], and Mevacor [lovastatin]   ROS:   Please see the history of present illness.    All other systems reviewed and are negative.  EKGs/Labs/Other Studies Reviewed:    The following studies were reviewed today: Cardiac Studies & Procedures   ______________________________________________________________________________________________   STRESS TESTS  NM MYOCAR MULTI W/SPECT W 11/24/2015  Narrative  The patient has V pacing with corresponding ECG changes.  The study is normal. No ischemia. No evidence of MI  This is a low risk study.  The left ventricular ejection fraction is normal (55-65%).  Nuclear stress EF: 60%.   ECHOCARDIOGRAM  ECHOCARDIOGRAM COMPLETE 08/29/2023  Narrative ECHOCARDIOGRAM REPORT    Patient Name:   KAMERAN LALLIER Date of Exam: 08/29/2023 Medical Rec #:  [id number]       Height:       66.0 in Accession #:    [id number]      Weight:       185.0 lb Date of Birth:  1932-06-09       BSA:          1.935 m Patient Age:    89 years        BP:           158/53 mmHg Patient Gender: F               HR:           60 bpm. Exam Location:  Inpatient  Procedure: 2D Echo, Color Doppler, Cardiac Doppler and Intracardiac Opacification Agent (Both Spectral and Color Flow Doppler were utilized during procedure).  Indications:    R07.9* Chest pain, unspecified  History:        Patient has prior history of Echocardiogram examinations, most recent 01/31/2022. Pacemaker and Prior CABG; Risk Factors:Hypertension, Diabetes and Dyslipidemia.  Sonographer:    Damien Senior RDCS Referring Phys: [id number] [facility]  GOEL   Sonographer Comments: Technically difficult due to patient body habitus. IMPRESSIONS   1. Left ventricular ejection fraction, by estimation, is 55 to 60%. The left ventricle has normal function. The left ventricle has no regional wall motion abnormalities. There is mild concentric left ventricular hypertrophy. Left ventricular diastolic parameters are consistent with Grade II diastolic dysfunction (pseudonormalization). Elevated left atrial pressure. 2. Right ventricular systolic function is low normal. The right ventricular size is mildly enlarged. There is mildly elevated pulmonary artery systolic pressure. The estimated right ventricular systolic pressure is 38.3 mmHg. 3. Left atrial size was  moderately dilated. 4. The mitral valve is abnormal. Trivial mitral valve regurgitation. 5. Tricuspid valve regurgitation is mild to moderate. 6. The aortic valve is tricuspid. Aortic valve regurgitation is mild. Aortic valve sclerosis/calcification is present, without any evidence of aortic stenosis. 7. The inferior vena cava is normal in size with greater than 50% respiratory variability, suggesting right atrial pressure of 3 mmHg.  Comparison(s): No significant change from prior study. Prior images reviewed side by side.  FINDINGS Left Ventricle: Left ventricular ejection fraction, by estimation, is 55 to 60%. The left ventricle has normal function. The left ventricle has no regional wall motion abnormalities. Definity  contrast agent was given IV to delineate the left ventricular endocardial borders. The left ventricular internal cavity size was normal in size. There is mild concentric left ventricular hypertrophy. Abnormal (paradoxical) septal motion, consistent with RV pacemaker. Left ventricular diastolic parameters are consistent with Grade II diastolic dysfunction (pseudonormalization). Elevated left atrial pressure.  Right Ventricle: The right ventricular size is mildly enlarged. No  increase in right ventricular wall thickness. Right ventricular systolic function is low normal. There is mildly elevated pulmonary artery systolic pressure. The tricuspid regurgitant velocity is 2.97 m/s, and with an assumed right atrial pressure of 3 mmHg, the estimated right ventricular systolic pressure is 38.3 mmHg.  Left Atrium: Left atrial size was moderately dilated.  Right Atrium: Right atrial size was normal in size.  Pericardium: There is no evidence of pericardial effusion. Presence of epicardial fat layer.  Mitral Valve: The mitral valve is abnormal. There is moderate thickening of the mitral valve leaflet(s). Trivial mitral valve regurgitation.  Tricuspid Valve: The tricuspid valve is normal in structure. Tricuspid valve regurgitation is mild to moderate.  Aortic Valve: The aortic valve is tricuspid. Aortic valve regurgitation is mild. Aortic valve sclerosis/calcification is present, without any evidence of aortic stenosis.  Pulmonic Valve: The pulmonic valve was normal in structure. Pulmonic valve regurgitation is trivial.  Aorta: The aortic root and ascending aorta are structurally normal, with no evidence of dilitation.  Venous: The inferior vena cava is normal in size with greater than 50% respiratory variability, suggesting right atrial pressure of 3 mmHg.  IAS/Shunts: The atrial septum is grossly normal.   LEFT VENTRICLE PLAX 2D LVIDd:         3.60 cm   Diastology LVIDs:         2.60 cm   LV e' medial:    5.44 cm/s LV PW:         1.10 cm   LV E/e' medial:  17.5 LV IVS:        1.30 cm   LV e' lateral:   5.55 cm/s LVOT diam:     1.80 cm   LV E/e' lateral: 17.2 LV SV:         60 LV SV Index:   31 LVOT Area:     2.54 cm   RIGHT VENTRICLE RV S prime:     10.40 cm/s TAPSE (M-mode): 1.5 cm  LEFT ATRIUM             Index        RIGHT ATRIUM           Index LA diam:        4.00 cm 2.07 cm/m   RA Area:     19.70 cm LA Vol (A2C):   67.1 ml 34.68 ml/m  RA Volume:    62.60 ml  32.36 ml/m LA Vol (A4C):   79.1 ml 40.89  ml/m LA Biplane Vol: 72.4 ml 37.42 ml/m AORTIC VALVE LVOT Vmax:   91.00 cm/s LVOT Vmean:  74.300 cm/s LVOT VTI:    0.234 m  AORTA Ao Root diam: 2.80 cm Ao Asc diam:  3.40 cm  MITRAL VALVE               TRICUSPID VALVE MV Area (PHT): 3.34 cm    TR Peak grad:   35.3 mmHg MV Decel Time: 227 msec    TR Vmax:        297.00 cm/s MV E velocity: 95.40 cm/s MV A velocity: 99.00 cm/s  SHUNTS MV E/A ratio:  0.96        Systemic VTI:  0.23 m Systemic Diam: 1.80 cm  Jerel Balding MD Electronically signed by Jerel Balding MD Signature Date/Time: 08/29/2023/2:27:57 PM    Final          ______________________________________________________________________________________________      EKG:        Recent Labs: 11/17/2023: B Natriuretic Peptide 225.0 01/22/2024: ALT 19; BUN 30; Creatinine, Ser 1.47; Hemoglobin 11.8; Platelets 208.0; Potassium 4.0; Pro B Natriuretic peptide (BNP) 418.0; Sodium 139  Recent Lipid Panel    Component Value Date/Time   CHOL 131 09/05/2023 1233   TRIG 121.0 09/05/2023 1233   TRIG 192 (H) 04/20/2006 1246   HDL 67.40 09/05/2023 1233   CHOLHDL 2 09/05/2023 1233   VLDL 24.2 09/05/2023 1233   LDLCALC 39 09/05/2023 1233   LDLDIRECT 82.5 09/11/2009 0000        Physical Exam:    VS:  BP (!) 168/64 (BP Location: Left Arm, Patient Position: Sitting, Cuff Size: Large)   Pulse 68   Resp 16   Ht 5' 7 (1.702 m)   Wt 190 lb 3.2 oz (86.3 kg)   SpO2 95%   BMI 29.79 kg/m     Wt Readings from Last 3 Encounters:  02/09/24 190 lb 3.2 oz (86.3 kg)  01/30/24 180 lb (81.6 kg)  12/26/23 180 lb (81.6 kg)     GEN: Elderly woman in no acute distress HEENT: Normal NECK: Moderate JVD; No carotid bruits LYMPHATICS: No lymphadenopathy CARDIAC: RRR, no murmurs, rubs, gallops RESPIRATORY:  Clear to auscultation without rales, wheezing or rhonchi  ABDOMEN: Soft, non-tender, non-distended MUSCULOSKELETAL: 3+  bilateral pretibial and calf edema; No deformity  SKIN: Warm and dry NEUROLOGIC:  Alert and oriented x 3 PSYCHIATRIC:  Normal affect   Assessment & Plan Chronic heart failure with preserved ejection fraction (HCC) The patient has evidence of volume overload on exam as well as progressive fatigue and dyspnea with NYHA functional class III symptoms.  Her most recent echo is reviewed and shows preserved LVEF with grade 2 diastolic dysfunction.  I recommended that she stop amlodipine  in the setting of her significant edema.  I have recommended that she increase torsemide to 40 mg daily.  She will continue on Entresto  and carvedilol  at current doses.  Check metabolic panel and BNP in 3 to 4 weeks and return for an APP follow-up visit to assess her treatment response. Coronary artery disease involving native coronary artery of native heart without angina pectoris Patient is stable with no anginal symptoms at present.  Continue clopidogrel  for antiplatelet therapy. Sick sinus syndrome (HCC) Patient's status post permanent pacemaker implant Essential hypertension Amlodipine  discontinued in the setting of edema.  Increased loop diuretic and continue Entresto  and carvedilol  at current doses. Hyperlipidemia LDL goal <70 Treated with atorvastatin .  LDL cholesterol is 39.  Medication Adjustments/Labs and Tests Ordered: Current medicines are reviewed at length with the patient today.  Concerns regarding medicines are outlined above.  Orders Placed This Encounter  Procedures   Basic metabolic panel with GFR   Pro b natriuretic peptide (BNP)   Meds ordered this encounter  Medications   torsemide 40 MG TABS    Sig: Take 40 mg by mouth daily.    Dispense:  30 tablet    Refill:  3    Patient Instructions  Medication Instructions:  STOP Amlodipine   INCREASE Torsemide to 40 mg once daily   *If you need a refill on your cardiac medications before your next appointment, please call  your pharmacy*  Lab Work: To be completed in 3-4 weeks: BMP and Pro-BNP (make sure to have labs completed at least 2 days prior to follow-up appointment)  If you have labs (blood work) drawn today and your tests are completely normal, you will receive your results only by: MyChart Message (if you have MyChart) OR A paper copy in the mail If you have any lab test that is abnormal or we need to change your treatment, we will call you to review the results.  Testing/Procedures: None ordered today.  Follow-Up: At [facility], you and your health needs are our priority.  As part of our continuing mission to provide you with exceptional heart care, our providers are all part of one team.  This team includes your primary Cardiologist (physician) and Advanced Practice Providers or APPs (Physician Assistants and Nurse Practitioners) who all work together to provide you with the care you need, when you need it.  Your next appointment:   3-4 week(s)  Provider:   APP      Signed, Ozell Fell, MD  02/09/2024 1:12 PM    [facility]

## 2024-02-09 NOTE — Patient Instructions (Signed)
 Medication Instructions:  STOP Amlodipine   INCREASE Torsemide to 40 mg once daily   *If you need a refill on your cardiac medications before your next appointment, please call your pharmacy*  Lab Work: To be completed in 3-4 weeks: BMP and Pro-BNP (make sure to have labs completed at least 2 days prior to follow-up appointment)  If you have labs (blood work) drawn today and your tests are completely normal, you will receive your results only by: MyChart Message (if you have MyChart) OR A paper copy in the mail If you have any lab test that is abnormal or we need to change your treatment, we will call you to review the results.  Testing/Procedures: None ordered today.  Follow-Up: At [facility], you and your health needs are our priority.  As part of our continuing mission to provide you with exceptional heart care, our providers are all part of one team.  This team includes your primary Cardiologist (physician) and Advanced Practice Providers or APPs (Physician Assistants and Nurse Practitioners) who all work together to provide you with the care you need, when you need it.  Your next appointment:   3-4 week(s)  Provider:   APP

## 2024-02-09 NOTE — Assessment & Plan Note (Signed)
 Treated with atorvastatin .  LDL cholesterol is 39.

## 2024-02-13 ENCOUNTER — Encounter: Payer: Self-pay | Admitting: Family Medicine

## 2024-02-13 ENCOUNTER — Ambulatory Visit: Admitting: Family Medicine

## 2024-02-13 VITALS — BP 160/88 | HR 60 | Temp 97.8°F | Resp 18 | Ht 67.0 in

## 2024-02-13 DIAGNOSIS — R600 Localized edema: Secondary | ICD-10-CM | POA: Diagnosis not present

## 2024-02-13 DIAGNOSIS — M109 Gout, unspecified: Secondary | ICD-10-CM | POA: Diagnosis not present

## 2024-02-13 MED ORDER — COLCHICINE 0.6 MG PO TABS: ORAL_TABLET | ORAL | 1 refills | Status: AC

## 2024-02-13 NOTE — Telephone Encounter (Signed)
 Spoke with pt who stated she is not taking Lasix  and only taking her Torsemide.

## 2024-02-13 NOTE — Progress Notes (Signed)
 Subjective:    Patient ID: [name], female    DOB: 21-Aug-1932, 89 y.o.   MRN: [id number]  Chief Complaint  Patient presents with   Edema   Follow-up    HPI Patient is in today for f/u edema.  Discussed the use of AI scribe software for clinical note transcription with the patient, who gave verbal consent to proceed.  History of Present Illness [name] is an 89 year old female with hypertension who presents with leg pain and swelling. She is accompanied by her grandson.  She experiences persistent pain and swelling in her legs, predominantly in the left leg, which has not improved despite recent medication adjustments. The pain is localized to the posterior aspect of the leg without anterior radiation. Swelling is more pronounced in the left leg compared to the right, with the right leg's swelling decreasing at night, while the left remains swollen. Compression socks have not provided significant relief.  Her blood pressure remains elevated at 160/88, consistent with previous readings. She is on the highest dose of Entresto . Her Torsemide dose was recently increased from 20 mg to 40 mg twice daily, but there has been no improvement in her symptoms. She also takes potassium supplements with her diuretics.  She has a history of gout, which causes significant swelling in her big toe. Colchicine  has been effective in resolving gout pain within three to four days in the past. She is currently on allopurinol  for gout management.  No worsening of leg pain since the last visit. Compression socks do not cause discomfort. She remains active, walking around her house despite the leg pain.    Past Medical History:  Diagnosis Date   Allergic rhinitis    Anemia    Anxiety    Barrett esophagus    CAD (coronary artery disease) 2009   a. Multivessel s/p PCI w/DES 2009 // b. s/p CABG 2011  //  c. LHC 8/15: pLAD 95 ISR, LCx 100, pOM1 40, dRCA 100, S-OM1/OM2 ok, S-D1 ok, S-PDA ok, L-LAD  ok, EF 60%   Carotid artery disease    a. Carotid US  9/15: RICA 1-39%; LICA 40-59% >> FU 1 year  //  b. Carotid US  9/17: R 1-39%, L 40-59% >> FU 1 year   Chronic diastolic heart failure (HCC)    CKD (chronic kidney disease), stage II    GFR 60-89 ml/min   Depression    Disc disease, degenerative, cervical    Diverticulosis    Gastroparesis    GERD (gastroesophageal reflux disease)    Gout    H/O hiatal hernia    Helicobacter pylori gastritis    History of echocardiogram    a. Echo 11/13: EF 55% to 60%. Grade 2 diastolic dysfunction, MAC, trivial MR, mild LAE, normal RVSF, mild RAE, PASP 39 mmHg  //  b. Echo 4/17: EF 55-60%, normal wall motion, trivial AI, MAC, moderate LAE, mild RVE, PASP 35 mmHg   History of thrombocytopenia    HTN (hypertension)    Hyperlipidemia    Hypothyroidism    LBP (low back pain)    Lumbar disc disease/lumbar spinal stenosis   Macular degeneration    Morbid obesity (HCC)    Myocardial infarction (HCC)    Osteoarthritis    Osteopenia    PVD (peripheral vascular disease)    Sick sinus syndrome (HCC)    MDT Dual-chamber PPM implant 02/2012   Type II or unspecified type diabetes mellitus without mention of complication, not stated  as uncontrolled     Past Surgical History:  Procedure Laterality Date   ABDOMINAL HYSTERECTOMY     CARDIAC CATHETERIZATION     2011  DR COOPER (APPT NEXT WEEK)   CHOLECYSTECTOMY     CORONARY ARTERY BYPASS GRAFT  2011   LIMA-LAD, SVG-DIAG, SVG-OM1-OM2, SVG-PDA   CORONARY STENT PLACEMENT     Drug-eluting stent to the left anterior descending, circumflex and right coronary artery in Jan 2009   EYE SURGERY     BIL CATARACT REMOVAL 06/2010   LEFT HEART CATHETERIZATION WITH CORONARY ANGIOGRAM N/A 12/02/2013   Procedure: LEFT HEART CATHETERIZATION WITH CORONARY ANGIOGRAM;  Surgeon: Victory LELON Claudene DOUGLAS, MD;  Location: [facility] CATH LAB;  Service: Cardiovascular;  Laterality: N/A;   OVARIAN CYST REMOVAL     PACEMAKER INSERTION  03/06/12    MDT Adapta L implanted by Dr Kelsie for SSS   PERMANENT PACEMAKER INSERTION N/A 03/06/2012   Procedure: PERMANENT PACEMAKER INSERTION;  Surgeon: Lynwood Kelsie, MD;  Location: [facility] CATH LAB;  Service: Cardiovascular;  Laterality: N/A;   SHOULDER ARTHROSCOPY  06/16/2011   Procedure: ARTHROSCOPY SHOULDER;  Surgeon: Rome JULIANNA Pepper, MD;  Location: [facility] OR;  Service: Orthopedics;  Laterality: Left;  LEFT SHOULDER ARTHROSCOPY ACROMIALPLASTY, POSSIBLE MINI OPEN CUFF REPAIR    TUBAL LIGATION      Family History  Problem Relation Age of Onset   Diabetes Mother    Hypertension Mother    Heart attack Mother    Stroke Father    Cancer Brother    Deafness Daughter    Other Son        cerebral edema at 18 months   Coronary artery disease Other     Social History   Socioeconomic History   Marital status: Widowed    Spouse name: Not on file   Number of children: Not on file   Years of education: Not on file   Highest education level: Not on file  Occupational History   Occupation: RETIRED LPN    Comment: worked as [employer]  Tobacco Use   Smoking status: Former    Current packs/day: 0.00    Average packs/day: 0.3 packs/day for 28.0 years (7.0 ttl pk-yrs)    Types: Cigarettes    Start date: 45    Quit date: 70    Years since quitting: 31.8   Smokeless tobacco: Never   Tobacco comments:    quit 30 yrs ago  Vaping Use   Vaping status: Never Used  Substance and Sexual Activity   Alcohol use: No   Drug use: No   Sexual activity: Not on file  Other Topics Concern   Not on file  Social History Narrative   Widowed 2004.., Lives with daughter and grand son ,Family history is negative for premature coronary artery disease. Mother died at age 73 with heart disease in her later years, father died at age 31 from a stroke.SABRAShe  has 8 siblings, none of whom have coronary artery disease.   Social Drivers of Corporate Investment Banker Strain: Low Risk  (12/26/2023)   Overall Financial Resource Strain  (CARDIA)    Difficulty of Paying Living Expenses: Not very hard  Food Insecurity: No Food Insecurity (12/26/2023)   Hunger Vital Sign    Worried About Running Out of Food in the Last Year: Never true    Ran Out of Food in the Last Year: Never true  Transportation Needs: No Transportation Needs (12/26/2023)   PRAPARE - Transportation  Lack of Transportation (Medical): No    Lack of Transportation (Non-Medical): No  Physical Activity: Insufficiently Active (12/26/2023)   Exercise Vital Sign    Days of Exercise per Week: 2 days    Minutes of Exercise per Session: 10 min  Stress: No Stress Concern Present (12/26/2023)   [employer] of Occupational Health - Occupational Stress Questionnaire    Feeling of Stress: Only a little  Social Connections: Moderately Isolated (12/26/2023)   Social Connection and Isolation Panel    Frequency of Communication with Friends and Family: More than three times a week    Frequency of Social Gatherings with Friends and Family: Once a week    Attends Religious Services: More than 4 times per year    Active Member of [employer] or Organizations: No    Attends Banker Meetings: Never    Marital Status: Widowed  Intimate Partner Violence: Not At Risk (12/26/2023)   Humiliation, Afraid, Rape, and Kick questionnaire    Fear of Current or Ex-Partner: No    Emotionally Abused: No    Physically Abused: No    Sexually Abused: No    Outpatient Medications Prior to Visit  Medication Sig Dispense Refill   ACCU-CHEK GUIDE test strip CHECK GLUCOSE IN THE MORNING AT  NOON AND AT BEDTIME 100 strip 9   Accu-Chek Softclix Lancets lancets CHECK GLUCOSE IN THE MORNING AT  NOON AND AT BEDTIME 100 each 9   acetaminophen  (TYLENOL ) 500 MG tablet Take 500 mg by mouth every 6 (six) hours as needed for headache (pain).     albuterol  (VENTOLIN  HFA) 108 (90 Base) MCG/ACT inhaler Inhale 2 puffs into the lungs every 6 (six) hours as needed for wheezing or shortness of  breath. 8 g 2   allopurinol  (ZYLOPRIM ) 100 MG tablet Take 1 tablet (100 mg total) by mouth at bedtime. 90 tablet 3   atorvastatin  (LIPITOR) 20 MG tablet TAKE 1 TABLET BY MOUTH ONCE  DAILY 100 tablet 2   azelastine (OPTIVAR) 0.05 % ophthalmic solution Place 1 drop into both eyes 2 (two) times daily.     carvedilol  (COREG ) 12.5 MG tablet TAKE 1 TABLET BY MOUTH TWICE  DAILY 180 tablet 2   clopidogrel  (PLAVIX ) 75 MG tablet TAKE 1 TABLET BY MOUTH DAILY 90 tablet 3   methocarbamol  (ROBAXIN ) 500 MG tablet Take 1 tablet (500 mg total) by mouth every 6 (six) hours as needed for muscle spasms. 120 tablet 0   Multiple Vitamins-Minerals (ONE A DAY WOMEN 50 PLUS) TABS Take 1 tablet by mouth daily.     mupirocin  ointment (BACTROBAN ) 2 % Apply 1 Application topically 2 (two) times daily. 22 g 0   nitroGLYCERIN  (NITROSTAT ) 0.4 MG SL tablet DISSOLVE ONE TABLET UNDER THE TONGUE EVERY 5 MINUTES AS NEEDED FOR CHEST PAIN.  DO NOT EXCEED A TOTAL OF 3 DOSES IN 15 MINUTES 25 tablet 3   pantoprazole  (PROTONIX ) 40 MG tablet Take 1 tablet (40 mg total) by mouth daily. 90 tablet 3   potassium chloride  (KLOR-CON ) 10 MEQ tablet Take 1 tablet (10 mEq total) by mouth daily. 90 tablet 3   sacubitril -valsartan  (ENTRESTO ) 97-103 MG TAKE 1 TABLET BY MOUTH TWICE  DAILY 180 tablet 2   torsemide 40 MG TABS Take 40 mg by mouth daily. 30 tablet 3   triamcinolone  cream (KENALOG ) 0.1 % Apply 1 Application topically 2 (two) times daily. 30 g 0   amLODipine  (NORVASC ) 5 MG tablet Take 1 tablet (5 mg total) by mouth at  bedtime. (Patient not taking: Reported on 02/13/2024) 100 each 3   predniSONE  (DELTASONE ) 10 MG tablet TAKE 3 TABLETS PO QD FOR 3 DAYS THEN TAKE 2 TABLETS PO QD FOR 3 DAYS THEN TAKE 1 TABLET PO QD FOR 3 DAYS THEN TAKE 1/2 TAB PO QD FOR 3 DAYS 20 tablet 0   No facility-administered medications prior to visit.    Allergies  Allergen Reactions   Codeine Other (See Comments)    Hallucinations   Hydrocodone Other (See Comments)     Made her pass out   Penicillins Hives   Shellfish Allergy Hives   Sulfonamide Derivatives Nausea And Vomiting   Cipro  [Ciprofloxacin  Hcl] Nausea And Vomiting and Other (See Comments)    Syncope    Morphine  And Codeine Other (See Comments)    Hallucinations Went crazy   Tiazac [Diltiazem] Other (See Comments)    Bradycardia    Glucophage  [Metformin ] Diarrhea   Mevacor [Lovastatin] Other (See Comments)    Unknown reaction    Review of Systems  Constitutional:  Negative for fever and malaise/fatigue.  HENT:  Negative for congestion.   Eyes:  Negative for blurred vision.  Respiratory:  Negative for cough and shortness of breath.   Cardiovascular:  Positive for leg swelling. Negative for chest pain and palpitations.  Gastrointestinal:  Negative for vomiting.  Musculoskeletal:  Negative for back pain.  Skin:  Negative for rash.  Neurological:  Negative for loss of consciousness and headaches.       Objective:    Physical Exam Vitals and nursing note reviewed.  Constitutional:      General: She is not in acute distress.    Appearance: Normal appearance. She is well-developed.  HENT:     Head: Normocephalic and atraumatic.  Eyes:     General: No scleral icterus.       Right eye: No discharge.        Left eye: No discharge.  Cardiovascular:     Rate and Rhythm: Normal rate and regular rhythm.     Heart sounds: No murmur heard. Pulmonary:     Effort: Pulmonary effort is normal. No respiratory distress.     Breath sounds: Normal breath sounds.  Musculoskeletal:        General: Normal range of motion.     Cervical back: Normal range of motion and neck supple.     Right lower leg: No tenderness. 1+ Edema present.     Left lower leg: Tenderness present. 2+ Edema present.  Skin:    General: Skin is warm and dry.  Neurological:     Mental Status: She is alert and oriented to person, place, and time.  Psychiatric:        Mood and Affect: Mood normal.        Behavior:  Behavior normal.        Thought Content: Thought content normal.        Judgment: Judgment normal.     BP (!) 160/88 (BP Location: Left Arm, Patient Position: Sitting, Cuff Size: Large)   Pulse 60   Temp 97.8 F (36.6 C) (Oral)   Resp 18   Ht 5' 7 (1.702 m)   SpO2 99%   BMI 29.79 kg/m  Wt Readings from Last 3 Encounters:  02/09/24 190 lb 3.2 oz (86.3 kg)  01/30/24 180 lb (81.6 kg)  12/26/23 180 lb (81.6 kg)    Diabetic Foot Exam - Simple   No data filed    Lab Results  Component  Value Date   WBC 8.2 01/22/2024   HGB 11.8 (L) 01/22/2024   HCT 35.7 (L) 01/22/2024   PLT 208.0 01/22/2024   GLUCOSE 156 (H) 01/22/2024   CHOL 131 09/05/2023   TRIG 121.0 09/05/2023   HDL 67.40 09/05/2023   LDLDIRECT 82.5 09/11/2009   LDLCALC 39 09/05/2023   ALT 19 01/22/2024   AST 26 01/22/2024   NA 139 01/22/2024   K 4.0 01/22/2024   CL 100 01/22/2024   CREATININE 1.47 (H) 01/22/2024   BUN 30 (H) 01/22/2024   CO2 29 01/22/2024   TSH 4.78 03/31/2022   INR 1.1 08/29/2023   HGBA1C 7.4 (H) 09/05/2023   MICROALBUR 6.6 (H) 09/05/2023    Lab Results  Component Value Date   TSH 4.78 03/31/2022   Lab Results  Component Value Date   WBC 8.2 01/22/2024   HGB 11.8 (L) 01/22/2024   HCT 35.7 (L) 01/22/2024   MCV 94.2 01/22/2024   PLT 208.0 01/22/2024   Lab Results  Component Value Date   NA 139 01/22/2024   K 4.0 01/22/2024   CO2 29 01/22/2024   GLUCOSE 156 (H) 01/22/2024   BUN 30 (H) 01/22/2024   CREATININE 1.47 (H) 01/22/2024   BILITOT 0.5 01/22/2024   ALKPHOS 74 01/22/2024   AST 26 01/22/2024   ALT 19 01/22/2024   PROT 6.5 01/22/2024   ALBUMIN 3.7 01/22/2024   CALCIUM  8.7 01/22/2024   ANIONGAP 11 11/17/2023   EGFR 25 (L) 11/24/2023   GFR 31.52 (L) 01/22/2024   Lab Results  Component Value Date   CHOL 131 09/05/2023   Lab Results  Component Value Date   HDL 67.40 09/05/2023   Lab Results  Component Value Date   LDLCALC 39 09/05/2023   Lab Results  Component  Value Date   TRIG 121.0 09/05/2023   Lab Results  Component Value Date   CHOLHDL 2 09/05/2023   Lab Results  Component Value Date   HGBA1C 7.4 (H) 09/05/2023       Assessment & Plan:  Lower extremity edema -     Comprehensive metabolic panel with GFR  Gouty arthritis of left great toe -     Uric acid -     Colchicine ; Take 2 tabs (1.2 mg) by mouth once, then 1 tab (0.6 mg)  every 2 hours as needed until pain is gone. Max of 6 tabs in 24 hours or intolerable diarrhea.  Dispense: 30 tablet; Refill: 1  Assessment and Plan Assessment & Plan Localized edema and pain, left leg   Persistent edema and pain in her left leg show no improvement despite increased furosemide  dosage. Pain is localized to the back of the leg, with more fluid in the left leg than the right. Compression socks have not provided significant relief. There are concerns about kidney function with increased diuretic use. Checked kidney function and potassium levels to monitor for potential adverse effects of increased diuretic use.  Gout flare, left big toe   She is experiencing a gout flare in her left big toe, presenting with swelling and pain. Previous episodes responded to colchicine , and current symptoms are consistent with a gout flare. Uric acid levels will be checked to determine if allopurinol  dosage needs adjustment. Prescribed colchicine , 30 pills, to manage the acute gout flare. Checked uric acid levels to assess the need for adjusting allopurinol  dosage.  Hypertension   Her blood pressure remains elevated at 160/88 mmHg despite the current medication regimen. Amlodipine  was discontinued by Dr.  Cooper, and furosemide  was increased. Current medications include the highest dose of carvedilol  and Entresto . Coordination with Dr. Wonda is ongoing to optimize management. Continue the current antihypertensive regimen and coordinate with Dr. Wonda for further management adjustments.    Addalie Calles R Lowne Chase, DO

## 2024-02-14 LAB — COMPREHENSIVE METABOLIC PANEL WITH GFR
ALT: 16 U/L (ref 0–35)
AST: 18 U/L (ref 0–37)
Albumin: 3.7 g/dL (ref 3.5–5.2)
Alkaline Phosphatase: 75 U/L (ref 39–117)
BUN: 32 mg/dL — ABNORMAL HIGH (ref 6–23)
CO2: 29 meq/L (ref 19–32)
Calcium: 8.5 mg/dL (ref 8.4–10.5)
Chloride: 102 meq/L (ref 96–112)
Creatinine, Ser: 1.55 mg/dL — ABNORMAL HIGH (ref 0.40–1.20)
GFR: 29.56 mL/min — ABNORMAL LOW (ref >60.00)
Glucose, Bld: 121 mg/dL — ABNORMAL HIGH (ref 70–99)
Potassium: 3.9 meq/L (ref 3.5–5.1)
Sodium: 142 meq/L (ref 135–145)
Total Bilirubin: 0.7 mg/dL (ref 0.2–1.2)
Total Protein: 6.2 g/dL (ref 6.0–8.3)

## 2024-02-14 LAB — URIC ACID: Uric Acid, Serum: 9.6 mg/dL — ABNORMAL HIGH (ref 2.4–7.0)

## 2024-02-16 ENCOUNTER — Ambulatory Visit: Admitting: Surgical

## 2024-02-16 NOTE — Telephone Encounter (Signed)
 Still unable to reach pt lvm

## 2024-02-19 ENCOUNTER — Other Ambulatory Visit: Payer: Self-pay | Admitting: Cardiovascular Disease

## 2024-02-19 ENCOUNTER — Telehealth: Payer: Self-pay | Admitting: Cardiovascular Disease

## 2024-02-19 DIAGNOSIS — I5032 Chronic diastolic (congestive) heart failure: Secondary | ICD-10-CM

## 2024-02-19 NOTE — Telephone Encounter (Signed)
 Torsemide 40mg  not in stock, [facility] request new Rx for new dosage. Fax scanned to media

## 2024-02-19 NOTE — Telephone Encounter (Signed)
 Pt c/o medication issue:  1. Name of Medication: torsemide 40 MG TABS   2. How are you currently taking this medication (dosage and times per day)?   3. Are you having a reaction (difficulty breathing--STAT)? No  4. What is your medication issue? Pharmacy requesting that dosage by changed to 20 MG being that insurance will not cover 40 MG tablet. Please advise

## 2024-02-21 ENCOUNTER — Ambulatory Visit: Payer: Self-pay | Admitting: Family Medicine

## 2024-02-21 MED ORDER — TORSEMIDE 20 MG PO TABS: 40.0000 mg | ORAL_TABLET | Freq: Every day | ORAL | 3 refills | Status: AC

## 2024-02-21 MED ORDER — TORSEMIDE 40 MG PO TABS: 40.0000 mg | ORAL_TABLET | Freq: Every day | ORAL | 3 refills | Status: DC

## 2024-02-21 NOTE — Telephone Encounter (Signed)
 Yes fine to change the medication. Her PCP reached out and she may actually need to take more than 40 mg daily, so it will be easier to adjust if she is taking two 20 mg pills daily. If she needs to increase to 60 mg daily, she can take three.

## 2024-02-23 ENCOUNTER — Telehealth: Payer: Self-pay

## 2024-02-23 NOTE — Telephone Encounter (Signed)
 Has recall in and needs apt. Last OV 03/2021.

## 2024-02-23 NOTE — Telephone Encounter (Signed)
 Torsemide Rx of 20 mg sent to pharmacy 02/21/2024.

## 2024-02-27 ENCOUNTER — Ambulatory Visit: Payer: Self-pay

## 2024-02-27 ENCOUNTER — Telehealth: Payer: Self-pay

## 2024-02-27 ENCOUNTER — Other Ambulatory Visit: Payer: Self-pay | Admitting: Family Medicine

## 2024-02-27 MED ORDER — PREDNISONE 10 MG PO TABS: ORAL_TABLET | ORAL | 0 refills | Status: AC

## 2024-02-27 NOTE — Telephone Encounter (Signed)
 Copied from CRM [id number]. Topic: Clinical - Home Health Verbal Orders >> Feb 27, 2024  1:54 PM Harlene ORN wrote: [facility]  The patient and family reported that last night when getting in the bed, she lost her balance and hit her right elbow against the bedside table. In pain but can still has her full range of motion. Since the last clinitian was in, she as only able to take prednisone  10 MG tablets (script written in 12/15/2023); the lid wasnt secure and her medication fell in the toilet when she took it to the bathroom. This happened a week ago. Was only able to complete three days of the medicine. Pain in her body has increased since then. Would like to also extend her Occupational therapy starting the first week of December.  One visit next week, and then once a week for two weeks. Will do a reassessment.  Phone: [phone] (office number) Phone: [phone] Quinten, OT - secure voicemail)

## 2024-02-27 NOTE — Telephone Encounter (Signed)
 FYI Only or Action Required?: Action required by provider: update on patient condition and Verbal order to continue OT needed, [facility] [phone].  Patient was last seen in primary care on 02/13/2024 by Antonio Meth, Samantha SAUNDERS, DO.  Called Nurse Triage reporting Elbow Injury and Home Health.  Symptoms began today.  Interventions attempted: Rest, hydration, or home remedies and Other: OT session.  Symptoms are: stable.  Triage Disposition: Home Care  Patient/caregiver understands and will follow disposition?: Yes   Copied from CRM [id number]. Topic: Clinical - Red Word Triage >> Feb 27, 2024  1:53 PM Harlene ORN wrote: Red Word that prompted transfer to Nurse Triage: I have an Occupational Therapist on the list reporting about a patient that fell yesterday and is in a lot of bodily pain Reason for Disposition  Minor injury or bruise from direct blow  Answer Assessment - Initial Assessment Questions Additional info: 1) Call received from Richard OT [facility] to report elbow injury and obtain verbal orders for continued OT.   2) Richard-OT padded bedside table and educated on safe ways to get into bed.   3) Richard noted in chart that cardiologist discontinued amlodipine .   4) Requesting verbal order: 1 time per week for two weeks. Then reassess. Please call [phone] to provide verbal order.     1. MECHANISM: How did the injury happen?     Stumbled getting into bed, hit elbow on bedside table.  2. ONSET: When did the injury happen? (e.g., minutes, hours ago)      02/26/24 3. LOCATION: What part of the elbow is injured?      Elbow  4. APPEARANCE of INJURY: What does the injury look like?      Looks normal 5. SEVERITY: Can you use the elbow normally?  Can you bend it and straighten it fully?     Full rom  6. SIZE: For cuts, bruises, or swelling, ask: How large is it? (e.g., inches or centimeters; entire joint)      Denies  7. PAIN: Is there pain?  If Yes, ask: How bad is the pain?  (Scale 0-10; or none, mild, moderate, severe)     Rest 3/10, with walker 7/10 8. TETANUS: For any breaks in the skin, ask: When was your last tetanus booster?     No wounds 9. OTHER SYMPTOMS: Do you have any other symptoms?  (e.g., numbness in hand)     Denies other symptoms 10. PREGNANCY: Is there any chance you are pregnant? When was your last menstrual period?  Protocols used: Elbow Injury-A-AH

## 2024-02-29 NOTE — Telephone Encounter (Signed)
 Verbal given and made aware that new rx was sent.

## 2024-03-04 ENCOUNTER — Ambulatory Visit: Admitting: Emergency Medicine

## 2024-03-05 ENCOUNTER — Telehealth: Payer: Self-pay

## 2024-03-05 NOTE — Telephone Encounter (Signed)
 Copied from CRM [id number]. Topic: Clinical - Home Health Verbal Orders >> Mar 05, 2024  9:19 AM Amy B wrote: Caller/Agency: [facility] Callback Number: [phone] Service Requested: Home Physical Therapy Frequency: once a week for 2 weeks with plans to recertify to continue to focus on gait, balance and strengthening Any new concerns about the patient? No

## 2024-03-05 NOTE — Telephone Encounter (Signed)
Spoke w/ Liji- verbal orders given.  

## 2024-03-05 NOTE — Telephone Encounter (Signed)
 Okay to give verbal orders?  ?

## 2024-03-11 ENCOUNTER — Other Ambulatory Visit: Payer: Self-pay | Admitting: *Deleted

## 2024-03-11 ENCOUNTER — Ambulatory Visit: Admitting: Surgical

## 2024-03-11 DIAGNOSIS — R062 Wheezing: Secondary | ICD-10-CM

## 2024-03-11 DIAGNOSIS — M19021 Primary osteoarthritis, right elbow: Secondary | ICD-10-CM | POA: Diagnosis not present

## 2024-03-11 MED ORDER — ALBUTEROL SULFATE HFA 108 (90 BASE) MCG/ACT IN AERS: 2.0000 | INHALATION_SPRAY | Freq: Four times a day (QID) | RESPIRATORY_TRACT | 2 refills | Status: AC | PRN

## 2024-03-11 NOTE — Progress Notes (Signed)
 Follow-up Office Visit Note   Patient: [name]           Date of Birth: Aug 30, 1932           MRN: [id number] Visit Date: 03/11/2024 Requested by: [address] [place], [place] [address] [place],  [place] [address] PCP: Antonio Meth, [name], DO  Subjective: No chief complaint on file.   [name] is a 89 y.o. female who returns to the office for follow-up visit.    Plan at last visit was: Impression is 89 year old female who has right elbow pain that has been ongoing for 6 weeks. She has radiographs demonstrating right elbow arthritis. No concerning features on exam. We discussed options available to patient including continuing with her muscle relaxers and Tylenol  versus adding topical Voltaren  and lidocaine  patches versus trying cortisone injection under ultrasound guidance. She would like to hold off on any injection for now. She will try the topical Voltaren  and we will see her back in 4 weeks for clinical recheck with consideration for injection at that time if she has no improvement.   Since then, patient notes right elbow pain persists and it is slightly better since she started using Voltaren  once daily.  Worse with weightbearing through her rollator.  No other changes or any new injuries.  Localizes pain on the outside of the elbow as well as deep inside through the anterior aspect of the elbow.              ROS: All systems reviewed are negative as they relate to the chief complaint within the history of present illness.  Patient denies fevers or chills.  Assessment & Plan: Visit Diagnoses: No diagnosis found.  Plan: [name] is a 89 y.o. female who returns to the office for follow-up visit.  Plan from last visit was noted above in HPI.  They now return with right elbow pain.  Has history of right elbow arthritis.  We discussed options available to patient such as continuing Voltaren  versus adding lidocaine  patches versus injection with  cortisone.  She would like to avoid this if possible as far as injections.  She will try lidocaine  patches in addition to the Voltaren  and see how this does.  Follow-up in 4 to 6 weeks for clinical recheck with consideration for injection at that time if no improvement.  Follow-Up Instructions: No follow-ups on file.   Orders:  No orders of the defined types were placed in this encounter.  No orders of the defined types were placed in this encounter.     Procedures: No procedures performed   Clinical Data: No additional findings.  Objective: Vital Signs: There were no vitals taken for this visit.  Physical Exam:  Constitutional: Patient appears well-developed HEENT:  Head: Normocephalic Eyes:EOM are normal Neck: Normal range of motion Cardiovascular: Normal rate Pulmonary/chest: Effort normal Neurologic: Patient is alert Skin: Skin is warm Psychiatric: Patient has normal mood and affect  Ortho Exam: Ortho exam demonstrates intact EPL, FPL, finger abduction, grip strength testing.  Has decrease of passive extension of the elbow.  Negative bicep hook test.  Has tenderness over the elbow joint line.  Intact bicep flexion, supination, tricep extension strength.  Decreased passive supination.  No cellulitis or skin changes.  2+ radial pulse of the right upper extremity.  Specialty Comments:  No specialty comments available.  Imaging: No results found.   PMFS History: Patient Active Problem List  Diagnosis Date Noted   Right elbow pain 12/15/2023   Non-pressure chronic ulcer of other part of left foot with unspecified severity (HCC) 07/01/2023   Parkinsonism, unspecified Parkinsonism type (HCC) 07/01/2023   Tinea corporis 07/01/2023   Dermatitis associated with moisture 03/03/2023   Open wound of skin 03/03/2023   Acute pain of left hip 09/01/2022   Need for pneumococcal 20-valent conjugate vaccination 08/22/2022   Rash 05/27/2022   Tremor 05/27/2022   Coronary  artery disease involving native coronary artery of native heart without angina pectoris 09/14/2021   Pain due to onychomycosis of toenails of both feet 01/15/2021   Type 2 diabetes mellitus with diabetic dermatitis, without long-term current use of insulin  (HCC) 01/15/2021   Palpitations 01/15/2018   Cardiac pacemaker in situ 01/15/2018   Heel spur, left 09/13/2017   Left knee pain 08/23/2017   CKD (chronic kidney disease), stage III (HCC) 12/04/2016   Chest pain 11/23/2015   Type I (juvenile type) diabetes mellitus with renal manifestations, not stated as uncontrolled(250.41) 04/27/2012   Insulin  dependent diabetes mellitus with complications 04/27/2012   AKI (acute kidney injury) 03/05/2012   Diarrhea 03/05/2012   Carotid artery disease (HCC) 08/11/2010   Left shoulder pain 07/08/2010   Preventative health care 07/08/2010   HOARSENESS 06/04/2010   Pruritus 05/11/2010   ANEMIA-NOS 04/02/2010   (HFpEF) heart failure with preserved ejection fraction (HCC) 02/11/2010   SINUS BRADYCARDIA 10/30/2009   Allergic rhinitis 09/11/2009   Backache 09/11/2009   MUSCLE STRAIN, RIGHT BUTTOCK 09/11/2009   Herpes zoster 05/11/2009   SHOULDER PAIN, LEFT 12/29/2008   Proteinuria 12/12/2008   Abdominal pain 09/24/2007   Hx of CABG 05/03/2007   CHEST PAIN 04/20/2007   Dizziness 02/28/2007   Anxiety state 02/15/2007   GERD 02/15/2007   Gastroparesis 02/15/2007   DISC DISEASE, CERVICAL 02/15/2007   DISC DISEASE, LUMBAR 02/15/2007   SPINAL STENOSIS, LUMBAR 02/15/2007   PERIPHERAL EDEMA 02/15/2007   Personal History of Other Diseases of Digestive Disease 02/15/2007   Hyperlipidemia LDL goal <70 01/01/2007   GOUT 01/01/2007   Morbid obesity (HCC) 01/01/2007   DEPRESSION 01/01/2007   PERIPHERAL VASCULAR DISEASE 01/01/2007   Diverticulosis of colon 01/01/2007   Osteoarthritis 01/01/2007   LOW BACK PAIN 01/01/2007   Disorder of bone and cartilage 01/01/2007   Essential hypertension 10/26/2006    Past Medical History:  Diagnosis Date   Allergic rhinitis    Anemia    Anxiety    Barrett esophagus    CAD (coronary artery disease) 2009   a. Multivessel s/p PCI w/DES 2009 // b. s/p CABG 2011  //  c. LHC 8/15: pLAD 95 ISR, LCx 100, pOM1 40, dRCA 100, S-OM1/OM2 ok, S-D1 ok, S-PDA ok, L-LAD ok, EF 60%   Carotid artery disease    a. Carotid US  9/15: RICA 1-39%; LICA 40-59% >> FU 1 year  //  b. Carotid US  9/17: R 1-39%, L 40-59% >> FU 1 year   Chronic diastolic heart failure (HCC)    CKD (chronic kidney disease), stage II    GFR 60-89 ml/min   Depression    Disc disease, degenerative, cervical    Diverticulosis    Gastroparesis    GERD (gastroesophageal reflux disease)    Gout    H/O hiatal hernia    Helicobacter pylori gastritis    History of echocardiogram    a. Echo 11/13: EF 55% to 60%. Grade 2 diastolic dysfunction, MAC, trivial MR, mild LAE, normal RVSF, mild RAE, PASP 39 mmHg  //  b. Echo 4/17: EF 55-60%, normal wall motion, trivial AI, MAC, moderate LAE, mild RVE, PASP 35 mmHg   History of thrombocytopenia    HTN (hypertension)    Hyperlipidemia    Hypothyroidism    LBP (low back pain)    Lumbar disc disease/lumbar spinal stenosis   Macular degeneration    Morbid obesity (HCC)    Myocardial infarction (HCC)    Osteoarthritis    Osteopenia    PVD (peripheral vascular disease)    Sick sinus syndrome (HCC)    MDT Dual-chamber PPM implant 02/2012   Type II or unspecified type diabetes mellitus without mention of complication, not stated as uncontrolled     Family History  Problem Relation Age of Onset   Diabetes Mother    Hypertension Mother    Heart attack Mother    Stroke Father    Cancer Brother    Deafness Daughter    Other Son        cerebral edema at 18 months   Coronary artery disease Other     Past Surgical History:  Procedure Laterality Date   ABDOMINAL HYSTERECTOMY     CARDIAC CATHETERIZATION     2011  DR COOPER (APPT NEXT WEEK)    CHOLECYSTECTOMY     CORONARY ARTERY BYPASS GRAFT  2011   LIMA-LAD, SVG-DIAG, SVG-OM1-OM2, SVG-PDA   CORONARY STENT PLACEMENT     Drug-eluting stent to the left anterior descending, circumflex and right coronary artery in Jan 2009   EYE SURGERY     BIL CATARACT REMOVAL 06/2010   LEFT HEART CATHETERIZATION WITH CORONARY ANGIOGRAM N/A 12/02/2013   Procedure: LEFT HEART CATHETERIZATION WITH CORONARY ANGIOGRAM;  Surgeon: Victory LELON Claudene DOUGLAS, MD;  Location: [facility] CATH LAB;  Service: Cardiovascular;  Laterality: N/A;   OVARIAN CYST REMOVAL     PACEMAKER INSERTION  03/06/12   MDT Adapta L implanted by Dr Kelsie for SSS   PERMANENT PACEMAKER INSERTION N/A 03/06/2012   Procedure: PERMANENT PACEMAKER INSERTION;  Surgeon: Lynwood Kelsie, MD;  Location: [facility] CATH LAB;  Service: Cardiovascular;  Laterality: N/A;   SHOULDER ARTHROSCOPY  06/16/2011   Procedure: ARTHROSCOPY SHOULDER;  Surgeon: Rome JULIANNA Pepper, MD;  Location: [facility] OR;  Service: Orthopedics;  Laterality: Left;  LEFT SHOULDER ARTHROSCOPY ACROMIALPLASTY, POSSIBLE MINI OPEN CUFF REPAIR    TUBAL LIGATION     Social History   Occupational History   Occupation: RETIRED LPN    Comment: worked as [employer]  Tobacco Use   Smoking status: Former    Current packs/day: 0.00    Average packs/day: 0.3 packs/day for 28.0 years (7.0 ttl pk-yrs)    Types: Cigarettes    Start date: 1966    Quit date: 1994    Years since quitting: 31.9   Smokeless tobacco: Never   Tobacco comments:    quit 30 yrs ago  Vaping Use   Vaping status: Never Used  Substance and Sexual Activity   Alcohol use: No   Drug use: No   Sexual activity: Not on file

## 2024-03-14 ENCOUNTER — Telehealth: Payer: Self-pay

## 2024-03-14 NOTE — Telephone Encounter (Signed)
Spoke w/ [facility]- verbal orders given .

## 2024-03-14 NOTE — Telephone Encounter (Signed)
 Copied from CRM #[id number]. Topic: Clinical - Home Health Verbal Orders >> Mar 14, 2024  1:55 PM Joesph NOVAK wrote: Caller/Agency: [facility] Callback Number: [phone] Service Requested: Physical Therapy Frequency: twice a week for two weeks, once a week for 4 weeks Any new concerns about the patient? No

## 2024-03-15 NOTE — Telephone Encounter (Signed)
 Spoke w/ patient - she is scheduled with Dr. Nancey on 1/7!

## 2024-03-20 NOTE — Progress Notes (Deleted)
°  Cardiology Office Note:  .   Date:  03/20/2024  ID:  [name], DOB December 12, 1932, MRN [id number] PCP: Antonio Meth, Samantha SAUNDERS, DO  [facility] Providers Cardiologist:  Ozell Fell, MD Cardiology APP:  Lelon Glendia DASEN, PA-C {  History of Present Illness: .   [name] is a 89 y.o. female with history of CAD status post CABG 2011, SSS status post PPM, chronic HFpEF, hypertension, hyperlipidemia, diabetes, CKD, PAD, Barrett's esophagus, mild carotid artery disease, hypothyroidism.     CAD CAD status post CABG 2011 11/2013 LHC SVG-OM1/OM2, SVG-D1, SVG-PDA, LIMA-LAD patent, EF 60 11/24/2015 Myoview : EF 60, no ischemia, no infarct; low risk  Social history      Patient with remote history of CABG in done well over the years with reassuring stress testing 2017.  Recently seen 01/2024 reporting lower extremity edema, chronic orthopnea that was unchanged over several years.  NYHA class III symptoms.  Amlodipine  stopped and torsemide  increased to 40 mg daily.  Chronic HFpEF - 08/2023 EF 55 to 60%, G2 DD.  Low normal RV.  RVSP 38.  Mild to moderate TR.  Mild AR.  CAD status post CABG Hyperlipidemia -11/2013 LHC SVG-OM1/OM2, SVG-D1, SVG-PDA, LIMA-LAD patent, EF 60 -11/24/2015 Myoview : EF 60, no ischemia, no infarct; low risk  SSS status post PPM Lost in follow-up.  Overdue for device interrogation.  Appointment 1/7.  Hypertension  Diabetes  PAD Unclear details of exactly what this is.  No claudication symptoms.  Mild carotid artery disease - 01/2018 mild disease bilaterally Guidelines would suggest routine monitoring if stenosis was greater than 50% which hers is not.  Barrett's esophagus Noted be mindful of TEEs in the future  ROS: Denies: Chest pain, shortness of breath, orthopnea, peripheral edema, palpitations, decreased exercise intolerance, fatigue, lightheadedness.   Studies Reviewed: .         Risk Assessment/Calculations:   {Does this patient have  ATRIAL FIBRILLATION?:[phone]} No BP recorded.  {Refresh Note OR Click here to enter BP  :1}***       Physical Exam:   VS:  There were no vitals taken for this visit.   Wt Readings from Last 3 Encounters:  02/09/24 190 lb 3.2 oz (86.3 kg)  01/30/24 180 lb (81.6 kg)  12/26/23 180 lb (81.6 kg)    GEN: Well nourished, well developed in no acute distress NECK: No JVD; No carotid bruits CARDIAC: ***RRR, no murmurs, rubs, gallops RESPIRATORY:  Clear to auscultation without rales, wheezing or rhonchi  ABDOMEN: Soft, non-tender, non-distended EXTREMITIES:  No edema; No deformity   ASSESSMENT AND PLAN: .         {Are you ordering a CV Procedure (e.g. stress test, cath, DCCV, TEE, etc)?   Press F2        :[id number]}  Dispo: ***  Signed, Thom LITTIE Sluder, PA-C

## 2024-03-21 ENCOUNTER — Ambulatory Visit: Attending: Internal Medicine | Admitting: Cardiology

## 2024-04-01 ENCOUNTER — Ambulatory Visit: Payer: Self-pay | Admitting: *Deleted

## 2024-04-01 NOTE — Telephone Encounter (Signed)
 Copied from CRM #[id number]. Topic: Clinical - Red Word Triage >> Apr 01, 2024 11:01 AM Deleta RAMAN wrote: Red Word that prompted transfer to Nurse Triage: patient physical therapist from [facility] called to notify she has chest pain. Pain level is about a 6. Patient took medication while pt was there. You can call the pta at [phone] Reason for Disposition  [1] Chest pain lasts > 5 minutes AND [2] history of heart disease (i.e., angina, heart attack, heart failure, bypass surgery, takes nitroglycerin )  Answer Assessment - Initial Assessment Questions Patient transferred to NT- patient states she is having chest pain and has taken 2 nitroglycerin  pills and still having pain. Patient does have some SOB in speaking. When asked if she needs 911- responded yes- 911 called. Patient is not alone - she does have her daughter at home with her. Advised 911 on the way- she declines to stay on the phone-she states her battery is dying  Protocols used: Chest Pain-A-AH

## 2024-04-01 NOTE — Telephone Encounter (Signed)
FYI. Pt going to ED.  

## 2024-04-08 ENCOUNTER — Ambulatory Visit: Payer: Self-pay

## 2024-04-08 ENCOUNTER — Ambulatory Visit: Admitting: Family Medicine

## 2024-04-08 NOTE — Telephone Encounter (Signed)
 FYI Only or Action Required?: Action required by provider: request for appointment. Needs follow up appt she missed today rescheduled.   For leg pain RN advised ER.   Patient was last seen in primary care on 02/13/2024 by Antonio Meth, [name], DO.  Called Nurse Triage reporting Leg Pain.  Symptoms began yesterday.  Interventions attempted: Rest, hydration, or home remedies.  Symptoms are: unchanged.  Triage Disposition: Go to ED Now (Notify PCP)  Patient/caregiver understands and will follow disposition?: Yes   Copied from CRM #[id number]. Topic: Clinical - Red Word Triage >> Apr 08, 2024  4:37 PM Alfonso HERO wrote: Patient having severe pain in her legs missed appt this morning Reason for Disposition  Unable to walk  Answer Assessment - Initial Assessment Questions Pt states woke up yesterday with her leg hurting. Then today she tried to go to there appt and her car had a flat tire. Pt states she has been trying to call since 0830 am and has not gotten through. She states that she is just now getting someone. She states her left knee is where the pain is. She states she is able to stand up but can't bare any weight on it. RN advised pt go to the ER.   1. ONSET: When did the pain start?      Yesterday morning 2. LOCATION: Where is the pain located?      Left knee 3. PAIN: How bad is the pain?    (Scale 1-10; or mild, moderate, severe)     severe 4. WORK OR EXERCISE: Has there been any recent work or exercise that involved this part of the body?      no 5. CAUSE: What do you think is causing the leg pain?     unknown 6. OTHER SYMPTOMS: Do you have any other symptoms? (e.g., chest pain, back pain, breathing difficulty, swelling, rash, fever, numbness, weakness)     denies  Protocols used: Leg Pain-A-AH

## 2024-04-09 NOTE — Telephone Encounter (Signed)
FYI - Pt advised to go to ED

## 2024-04-16 NOTE — Progress Notes (Deleted)
" °  Electrophysiology Office Note:    Date:  04/16/2024   ID:  [name], DOB [birth year], MRN [id number]  PCP:  Antonio Meth, [name], DO   [facility] Providers Cardiologist:  Ozell Fell, MD Cardiology APP:  Lelon Glendia DASEN, PA-C { Click to update primary MD,subspecialty MD or APP then REFRESH:1}    Referring MD: Antonio Meth, [name], DO   History of Present Illness:    [name] is a 89 y.o. female with a medical history significant for sinus node dysfunction with a Medtronic dual-chamber pacemaker, who presents for device follow-up.     She is a former patient of Dr. Kelsie.  She has a history of symptomatic sinus bradycardia as well as intermittent second-degree AV block.  Discussed the use of AI scribe software for clinical note transcription with the patient, who gave verbal consent to proceed.  History of Present Illness          Today, ***  EKGs/Labs/Other Studies Reviewed Today:     Echocardiogram:  TTE Aug 29, 2023 LVEF 55 to 60%.  Grade 2 diastolic dysfunction.  Moderately dilated left atrium.     EKG:         Physical Exam:    VS:  There were no vitals taken for this visit.    Wt Readings from Last 3 Encounters:  02/09/24 190 lb 3.2 oz (86.3 kg)  01/30/24 180 lb (81.6 kg)  12/26/23 180 lb (81.6 kg)     GEN: *** Well nourished, well developed in no acute distress CARDIAC: ***RRR, no murmurs, rubs, gallops RESPIRATORY:  Normal work of breathing MUSCULOSKELETAL: *** edema    ASSESSMENT & PLAN:     Symptomatic sinus bradycardia and second-degree AV block ***  Hypertension ***  Coronary artery disease ***  Obesity ***  *** ***    Signed, Eulas FORBES Furbish, MD  04/16/2024 8:19 PM    [facility] "

## 2024-04-17 ENCOUNTER — Ambulatory Visit: Admitting: Cardiovascular Disease

## 2024-04-18 ENCOUNTER — Telehealth: Payer: Self-pay

## 2024-04-18 NOTE — Telephone Encounter (Signed)
 Copied from CRM [id number]. Topic: Clinical - Home Health Verbal Orders >> Apr 17, 2024  4:10 PM Alexandria E wrote: Caller/Agency: Ruchard, OT with [facility] Callback Number: [phone] (secure line) Service Requested: Occupational Therapy Frequency: Charlie would like to see patient tomorrow 1/8 to get reassessment. Any new concerns about the patient? No

## 2024-04-18 NOTE — Telephone Encounter (Signed)
Spoke w/ Richard- verbal orders given.

## 2024-04-19 NOTE — Telephone Encounter (Signed)
 Copied from CRM #[id number]. Topic: Clinical - Home Health Verbal Orders >> Apr 19, 2024 10:30 AM Chasity T wrote: Caller/Agency: richard- [facility] Callback Number:  [phone] (can leave verbal on voicemail) Service Requested: Occupational Therapy Frequency: extend for 1 week 3 Any new concerns about the patient? No

## 2024-04-19 NOTE — Telephone Encounter (Signed)
 Tried calling [name], no answer, unable to leave message.

## 2024-04-19 NOTE — Telephone Encounter (Signed)
Spoke w/ Richard- verbal orders given.

## 2024-04-24 ENCOUNTER — Telehealth: Payer: Self-pay

## 2024-04-24 NOTE — Telephone Encounter (Signed)
 Copied from CRM [id number]. Topic: Clinical - Medical Advice >> Apr 24, 2024  1:49 PM Rea ORN wrote: Reason for CRM: Karlie NP with [facility] calls called to advise that pt is out of allopurinol  (ZYLOPRIM ) 100 MG. Pt is unsure when she ran out. Karlie asked if PCP still wanted her on this rx and if so if this can be refilled.  Pt advised Karlie that she had chest pain within the last week and she took nitroglycerin . The pt said the pain was in the center of her chest, radiated down her arm and was a 8/10. She stated taking that medication helped and she did not go to ED. Robet would like clinic to follow up with pt regards plans of action when she has chest pain because the pt was unsure of what to do in the moment.   Karlie said BP was a bit elevated, 156/62. Pt said she was told to stop amlodipine  so she doesn't take it day. PT did use amlodipine  yesterday due her feeling like her BP was elevated. Karlie said pt could not describe how she new it was elevated but told her she does take her BP regularly.   If there are any questions for Robet, please call [phone]

## 2024-04-25 NOTE — Telephone Encounter (Signed)
 Please advise regarding the allopurinol  and amlodipine 

## 2024-05-16 ENCOUNTER — Other Ambulatory Visit: Payer: Self-pay | Admitting: Cardiovascular Disease

## 2024-05-16 DIAGNOSIS — E785 Hyperlipidemia, unspecified: Secondary | ICD-10-CM

## 2024-07-03 ENCOUNTER — Ambulatory Visit: Admitting: Cardiovascular Disease

## 2025-01-01 ENCOUNTER — Ambulatory Visit
